# Patient Record
Sex: Male | Born: 1935
Health system: Southern US, Community
[De-identification: ages and names within clinical notes are randomized; demographics above are authoritative.]

## PROBLEM LIST (undated history)

## (undated) DIAGNOSIS — M109 Gout, unspecified: Secondary | ICD-10-CM

## (undated) DIAGNOSIS — I639 Cerebral infarction, unspecified: Secondary | ICD-10-CM

## (undated) DIAGNOSIS — E559 Vitamin D deficiency, unspecified: Secondary | ICD-10-CM

## (undated) DIAGNOSIS — N400 Enlarged prostate without lower urinary tract symptoms: Secondary | ICD-10-CM

## (undated) DIAGNOSIS — E785 Hyperlipidemia, unspecified: Secondary | ICD-10-CM

## (undated) DIAGNOSIS — I739 Peripheral vascular disease, unspecified: Secondary | ICD-10-CM

## (undated) DIAGNOSIS — M79605 Pain in left leg: Secondary | ICD-10-CM

## (undated) DIAGNOSIS — M1990 Unspecified osteoarthritis, unspecified site: Secondary | ICD-10-CM

## (undated) DIAGNOSIS — I872 Venous insufficiency (chronic) (peripheral): Secondary | ICD-10-CM

## (undated) DIAGNOSIS — D649 Anemia, unspecified: Secondary | ICD-10-CM

## (undated) DIAGNOSIS — I82409 Acute embolism and thrombosis of unspecified deep veins of unspecified lower extremity: Secondary | ICD-10-CM

## (undated) DIAGNOSIS — H269 Unspecified cataract: Secondary | ICD-10-CM

## (undated) DIAGNOSIS — I10 Essential (primary) hypertension: Secondary | ICD-10-CM

## (undated) HISTORY — DX: Venous insufficiency (chronic) (peripheral): I87.2

## (undated) HISTORY — DX: Acute embolism and thrombosis of unspecified deep veins of unspecified lower extremity: I82.409

## (undated) HISTORY — DX: Unspecified osteoarthritis, unspecified site: M19.90

## (undated) HISTORY — DX: Pain in left leg: M79.605

## (undated) HISTORY — DX: Cerebral infarction, unspecified: I63.9

## (undated) HISTORY — DX: Unspecified cataract: H26.9

## (undated) HISTORY — DX: Anemia, unspecified: D64.9

## (undated) HISTORY — DX: Vitamin D deficiency, unspecified: E55.9

## (undated) HISTORY — PX: CATARACT EXTRACTION, BILATERAL: SHX1313

---

## 2013-11-01 ENCOUNTER — Other Ambulatory Visit (HOSPITAL_COMMUNITY): Payer: Self-pay | Admitting: *Deleted

## 2013-11-01 ENCOUNTER — Ambulatory Visit (HOSPITAL_COMMUNITY)
Admission: RE | Admit: 2013-11-01 | Discharge: 2013-11-01 | Disposition: A | Discharge status: Home / Self Care | Payer: Medicare HMO | Source: Ambulatory Visit | Attending: *Deleted | Admitting: *Deleted

## 2013-11-01 DIAGNOSIS — M79609 Pain in unspecified limb: Secondary | ICD-10-CM

## 2013-11-01 DIAGNOSIS — L539 Erythematous condition, unspecified: Secondary | ICD-10-CM | POA: Insufficient documentation

## 2013-11-01 DIAGNOSIS — R599 Enlarged lymph nodes, unspecified: Secondary | ICD-10-CM | POA: Insufficient documentation

## 2013-11-01 DIAGNOSIS — M7989 Other specified soft tissue disorders: Secondary | ICD-10-CM

## 2013-11-01 NOTE — Progress Notes (Signed)
Left Lower Ext. Venous Duplex Completed. Negative for DVT. Tifani Dack, BS, RDMS, RVT  

## 2013-11-04 ENCOUNTER — Emergency Department (HOSPITAL_COMMUNITY)
Admission: EM | Admit: 2013-11-04 | Discharge: 2013-11-04 | Discharge status: Against Medical Advice | Payer: Medicare PPO

## 2013-11-04 ENCOUNTER — Inpatient Hospital Stay (HOSPITAL_COMMUNITY)
Admission: AD | Admit: 2013-11-04 | Discharge: 2013-11-09 | DRG: 603 | Disposition: A | Discharge status: Home / Self Care | Payer: Medicare HMO | Source: Ambulatory Visit | Attending: Internal Medicine | Admitting: Internal Medicine

## 2013-11-04 ENCOUNTER — Encounter (HOSPITAL_COMMUNITY): Payer: Self-pay | Admitting: Internal Medicine

## 2013-11-04 DIAGNOSIS — L03116 Cellulitis of left lower limb: Secondary | ICD-10-CM | POA: Diagnosis present

## 2013-11-04 DIAGNOSIS — I10 Essential (primary) hypertension: Secondary | ICD-10-CM | POA: Diagnosis present

## 2013-11-04 DIAGNOSIS — R630 Anorexia: Secondary | ICD-10-CM | POA: Diagnosis present

## 2013-11-04 DIAGNOSIS — N400 Enlarged prostate without lower urinary tract symptoms: Secondary | ICD-10-CM | POA: Diagnosis present

## 2013-11-04 DIAGNOSIS — L02419 Cutaneous abscess of limb, unspecified: Principal | ICD-10-CM | POA: Diagnosis present

## 2013-11-04 DIAGNOSIS — E785 Hyperlipidemia, unspecified: Secondary | ICD-10-CM | POA: Diagnosis present

## 2013-11-04 HISTORY — DX: Hyperlipidemia, unspecified: E78.5

## 2013-11-04 HISTORY — DX: Essential (primary) hypertension: I10

## 2013-11-04 HISTORY — DX: Benign prostatic hyperplasia without lower urinary tract symptoms: N40.0

## 2013-11-04 HISTORY — DX: Gout, unspecified: M10.9

## 2013-11-04 LAB — CBC
HCT: 33.7 % — ABNORMAL LOW (ref 39.0–52.0)
Hemoglobin: 11.3 g/dL — ABNORMAL LOW (ref 13.0–17.0)
MCHC: 33.5 g/dL (ref 30.0–36.0)
MCV: 90.3 fL (ref 78.0–100.0)
RDW: 13.9 % (ref 11.5–15.5)
WBC: 10.6 10*3/uL — ABNORMAL HIGH (ref 4.0–10.5)

## 2013-11-04 LAB — BASIC METABOLIC PANEL
BUN: 12 mg/dL (ref 6–23)
Calcium: 9.2 mg/dL (ref 8.4–10.5)
Creatinine, Ser: 0.95 mg/dL (ref 0.50–1.35)
GFR calc Af Amer: >90 mL/min (ref >90)
GFR calc non Af Amer: 78 mL/min — ABNORMAL LOW (ref >90)
Glucose, Bld: 113 mg/dL — ABNORMAL HIGH (ref 70–99)
Potassium: 4 mEq/L (ref 3.5–5.1)

## 2013-11-04 MED ORDER — VANCOMYCIN HCL 10 G IV SOLR
2000.0000 mg | Freq: Once | INTRAVENOUS | Status: AC
  Administered 2013-11-04: 2000 mg via INTRAVENOUS
  Filled 2013-11-04: qty 2000

## 2013-11-04 MED ORDER — HYDROCODONE-ACETAMINOPHEN 5-325 MG PO TABS
1.0000 | ORAL_TABLET | ORAL | Status: DC | PRN
  Administered 2013-11-04 – 2013-11-06 (×8): 2 via ORAL
  Filled 2013-11-04 (×8): qty 2

## 2013-11-04 MED ORDER — VANCOMYCIN HCL IN DEXTROSE 1-5 GM/200ML-% IV SOLN
1000.0000 mg | Freq: Two times a day (BID) | INTRAVENOUS | Status: DC
  Administered 2013-11-05 – 2013-11-07 (×4): 1000 mg via INTRAVENOUS
  Filled 2013-11-04 (×5): qty 200

## 2013-11-04 MED ORDER — HEPARIN SODIUM (PORCINE) 5000 UNIT/ML IJ SOLN
5000.0000 [IU] | Freq: Three times a day (TID) | INTRAMUSCULAR | Status: DC
  Administered 2013-11-04 – 2013-11-09 (×14): 5000 [IU] via SUBCUTANEOUS
  Filled 2013-11-04 (×17): qty 1

## 2013-11-04 NOTE — H&P (Signed)
[facility] History and Physical  Osiris Odriscoll JYN:[id number] DOB: 02/15/1933 DOA: 11/04/2013  Referring physician: EDP PCP: Egbert Garibaldi, NP   Chief Complaint: ED   HPI: Jesus Lam is a 79 y.o. male who is admitted from urgent care as a direct admit for LLE cellulitis, failed outpatient treatment.  Patient first noticed symptoms 12/8 days ago and it has progressively worsened.  Swelling and erythema in his LLE calf, [place] was negative for DVT.  WBC on 12/8 was 10.6.  Symptoms associated with anorexia.  Review of Systems: Systems reviewed.  As above, otherwise negative  Past Medical History  Diagnosis Date  . HTN (hypertension)   . BPH (benign prostatic hyperplasia)   . Dyslipidemia    No past surgical history on file. Social History:  has no tobacco, alcohol, and drug history on file.  No Known Allergies  No family history on file.   Prior to Admission medications   Not on File   Physical Exam: Filed Vitals:   11/04/13 1939  BP: 149/75  Pulse: 84  Temp: 98.1 F (36.7 C)  Resp: 18    BP 149/75  Pulse 84  Temp(Src) 98.1 F (36.7 C) (Oral)  Resp 18  Ht 6' (1.829 m)  Wt 115.214 kg (254 lb)  BMI 34.44 kg/m2  SpO2 97%  General Appearance:    Alert, oriented, no distress, appears stated age  Head:    Normocephalic, atraumatic  Eyes:    PERRL, EOMI, sclera non-icteric        Nose:   Nares without drainage or epistaxis. Mucosa, turbinates normal  Throat:   Moist mucous membranes. Oropharynx without erythema or exudate.  Neck:   Supple. No carotid bruits.  No thyromegaly.  No lymphadenopathy.   Back:     No CVA tenderness, no spinal tenderness  Lungs:     Clear to auscultation bilaterally, without wheezes, rhonchi or rales  Chest wall:    No tenderness to palpitation  Heart:    Regular rate and rhythm without murmurs, gallops, rubs  Abdomen:     Soft, non-tender, nondistended, normal bowel sounds, no organomegaly  Genitalia:    deferred   Rectal:    deferred  Extremities:   No clubbing, cyanosis or edema.  Pulses:   2+ and symmetric all extremities  Skin:   Erythema and edema of the LLE calf, TTP  Lymph nodes:   Cervical, supraclavicular, and axillary nodes normal  Neurologic:   CNII-XII intact. Normal strength, sensation and reflexes      throughout    Labs on Admission:  Basic Metabolic Panel: No results found for this basename: NA, K, CL, CO2, GLUCOSE, BUN, CREATININE, CALCIUM, MG, PHOS,  in the last 168 hours Liver Function Tests: No results found for this basename: AST, ALT, ALKPHOS, BILITOT, PROT, ALBUMIN,  in the last 168 hours No results found for this basename: LIPASE, AMYLASE,  in the last 168 hours No results found for this basename: AMMONIA,  in the last 168 hours CBC: No results found for this basename: WBC, NEUTROABS, HGB, HCT, MCV, PLT,  in the last 168 hours Cardiac Enzymes: No results found for this basename: CKTOTAL, CKMB, CKMBINDEX, TROPONINI,  in the last 168 hours  BNP (last 3 results) No results found for this basename: PROBNP,  in the last 8760 hours CBG: No results found for this basename: GLUCAP,  in the last 168 hours  Radiological Exams on Admission: No results found.  EKG: Independently reviewed.  Assessment/Plan Principal Problem:  Cellulitis of leg, left Active Problems:   HTN (hypertension)   1. Cellulitis of LLE calf - failed outpatient therapy, starting treatment with IV vancomycin, pharm to dose.  Pain control with norco.  Labs pending. 2. HTN - continue home BP meds once med rec is completed.    Code Status: Full  Family Communication: Daughter at bedside Disposition Plan: Admit to inpatient   Time spent: 50 min  GARDNER, JARED M. [facility] Pager [phone]  If 7AM-7PM, please contact the day team taking care of the patient [email or web address] Password [facility] 11/04/2013, 8:58 PM

## 2013-11-04 NOTE — Progress Notes (Signed)
Pt arrived on floor. Alert and oriented. Call light place within reach. Oriented to room and staff. Will monitor.

## 2013-11-04 NOTE — Progress Notes (Addendum)
Received call from [facility], [facility] from urgent care center.  Pt with a recent hx of LLL swelling and pain and follow up LE dopplers neg for DVT. Was empirically put on augmentin, started on 11/02/13. Was also given rocephin in the office on 11/01/13 and 11/02/13. WBC unremarkable at 10.6 as of 11/01/13. No hx of trauma. Temp 98.1 in the office.   Despite the above abx, the patient's LLE erythema, swelling and pain continued to worsen. As such, Hospitalists was called for possible direct admit for IV abx secondary to failure of treatment for cellulitis as an outpatient. Pt accepted for direct admit.

## 2013-11-04 NOTE — Progress Notes (Addendum)
ANTIBIOTIC CONSULT NOTE - INITIAL  Pharmacy Consult for vancomycin Indication: cellutlits of left lower leg  No Known Allergies  Patient Measurements: Height: 6' (182.9 cm) Weight: 254 lb (115.214 kg) IBW/kg (Calculated) : 77.6 Adjusted Body Weight:   Vital Signs: Temp: 98.1 F (36.7 C) (12/11 1939) Temp src: Oral (12/11 1939) BP: 149/75 mmHg (12/11 1939) Pulse Rate: 84 (12/11 1939) Intake/Output from previous day:   Intake/Output from this shift:    Labs: No results found for this basename: WBC, HGB, PLT, LABCREA, CREATININE,  in the last 72 hours CrCl is unknown because no creatinine reading has been taken. No results found for this basename: VANCOTROUGH, VANCOPEAK, VANCORANDOM, GENTTROUGH, GENTPEAK, GENTRANDOM, TOBRATROUGH, TOBRAPEAK, TOBRARND, AMIKACINPEAK, AMIKACINTROU, AMIKACIN,  in the last 72 hours   Microbiology: No results found for this or any previous visit (from the past 720 hour(s)).  Medical History: No past medical history on file.  Medications:  Scheduled:  Infusions:  Assessment: 79 yo male with cellulitis in left lower leg will be started on vancomycin therapy.  Patient was previously on augmentin and ceftriaxone with no improvement.  Wt 115.2 kg.  SCr 0.95 (CrCl 84)  Goal of Therapy:  Vancomycin trough level 10-15 mcg/ml  Plan:  1) Vancomycin 2g iv x1, then 1g iv q12h 2) Monitor renal function and culture and sensitivity if available 3) Follow up plan on antibiotic before checking vancomycin trough  Lani Mendiola, Tsz-Yin 11/04/2013,8:46 PM

## 2013-11-05 ENCOUNTER — Encounter (HOSPITAL_COMMUNITY): Payer: Self-pay | Admitting: General Practice

## 2013-11-05 LAB — CBC
HCT: 32.5 % — ABNORMAL LOW (ref 39.0–52.0)
Hemoglobin: 10.8 g/dL — ABNORMAL LOW (ref 13.0–17.0)
MCH: 30 pg (ref 26.0–34.0)
MCV: 90.3 fL (ref 78.0–100.0)
Platelets: 265 10*3/uL (ref 150–400)
RBC: 3.6 MIL/uL — ABNORMAL LOW (ref 4.22–5.81)
RDW: 13.9 % (ref 11.5–15.5)
WBC: 10.1 10*3/uL (ref 4.0–10.5)

## 2013-11-05 LAB — BASIC METABOLIC PANEL
BUN: 11 mg/dL (ref 6–23)
CO2: 23 mEq/L (ref 19–32)
Calcium: 8.9 mg/dL (ref 8.4–10.5)
Creatinine, Ser: 0.94 mg/dL (ref 0.50–1.35)
GFR calc Af Amer: >90 mL/min (ref >90)
Glucose, Bld: 104 mg/dL — ABNORMAL HIGH (ref 70–99)
Potassium: 3.8 mEq/L (ref 3.5–5.1)

## 2013-11-05 NOTE — Progress Notes (Signed)
Addendum  Patient seen and examined, chart and data base reviewed.  I agree with the above assessment and plan.  For full details please see Mrs. Algis Downs PA note.  On Vanc, continue.    Clint Lipps, MD [facility] Pager: [phone] 11/05/2013, 12:44 PM

## 2013-11-05 NOTE — Care Management Note (Addendum)
    Page 1 of 2   11/09/2013     4:06:16 PM   CARE MANAGEMENT NOTE 11/09/2013  Patient:  Jesus Lam, Jesus Lam   Account Number:  [id number]  Date Initiated:  11/05/2013  Documentation initiated by:  Letha Cape  Subjective/Objective Assessment:   dx cellulitis  admit- lives with daughter.     Action/Plan:   HHRN for iv abx   Anticipated DC Date:  11/09/2013   Anticipated DC Plan:  HOME W HOME HEALTH SERVICES      DC Planning Services  CM consult      [facility] Choice  HOME HEALTH   Choice offered to / List presented to:  C-4 Adult Children   DME arranged  BEDSIDE COMMODE  OTHER - SEE COMMENT      DME agency  [facility]     HH arranged  HH-1 RN  HH-2 PT  HH-3 OT      [facility] agency  [facility]   Status of service:  Completed, signed off Medicare Important Message given?   (If response is "NO", the following Medicare IM given date fields will be blank) Date Medicare IM given:   Date Additional Medicare IM given:    Discharge Disposition:  HOME W HOME HEALTH SERVICES  Per UR Regulation:  Reviewed for med. necessity/level of care/duration of stay  If discussed at Long Length of Stay Meetings, dates discussed:    Comments:  11/09/13 13:16 Letha Cape RN, BSN [phone] NCM spoke with patient and daughter, [name],  patient wants to go home with [facility] services and per OT rep patient is ok to go home with hh, Awaiting pt eval.  [name] states she would like for patient to have [facility], NCM left message for [facility] for [facility] for home iv abx, PT, and OT.  Patient is to receive a picc line today and dc today.  Received call from [facility] from [facility] stating they can take patient today, she will just need orders for Vanc in system, patient is receiving Vanc Q12 and last dose was at 10 am so he will prob need to get it at 8 pm tonight and then start at 8 am tomorrow.  [name] , patient's daughter states she will be abe to help him with the iv abx at  home.  Family requested [facility] , this information was given to [facility] at [facility].  Patient has picc line in and [facility] with [facility] has orders.  NCM faxed orders for rollator and bsc to [facility], family will pick up dme at [facility] today before 6 pm.  11/05/13 12:22 Letha Cape RN, BSN [phone] patient lives with daughter, NCM will continue to follow for dc needs.

## 2013-11-05 NOTE — Progress Notes (Signed)
[facility] PROGRESS NOTE  Jesus Lam WUJ:[id number] DOB: 12-08-1932 DOA: 11/04/2013 PCP: Egbert Garibaldi, NP   Jesus Lam is a 79 y.o. male who is admitted from urgent care as a direct admit for LLE cellulitis, failed outpatient treatment (Augmentin started 12/9). Patient first noticed symptoms 12/8 and it has progressively worsened. Swelling and erythema in his LLE calf, [place] on 12/8 was negative for DVT. WBC on 12/8 was 10.6.   Assessment/Plan:  LLE Cellulitis Admitted to med/surg floor  Started on vancomycin as of 12/11. Had two days of Augmentin outpatient. Patient with swelling and erythema.  Will trace erythema Elevate LLE.  Hypertension Stable.  Does not appear to be on any medications for hypertension as an outpatient.    DVT Prophylaxis:  heparin  Code Status: full Family Communication: Daughter at bedside Disposition Plan: lives with daughter - plan for d/c to home when appropriate (possible 12/13)   Consultants:   Procedures:   Antibiotics: Vancomycin started 12/11.  HPI/Subjective:  Patient complains of pain and swelling in left leg.  Also appetite has been off since 12/8.    Objective: Filed Vitals:   11/04/13 1939 11/05/13 0038 11/05/13 0444  BP: 149/75 125/70 143/64  Pulse: 84  67  Temp: 98.1 F (36.7 C)  98.9 F (37.2 C)  TempSrc: Oral  Oral  Resp: 18  18  Height: 6' (1.829 m)    Weight: 115.214 kg (254 lb)    SpO2: 97%  92%    Intake/Output Summary (Last 24 hours) at 11/05/13 1233 Last data filed at 11/05/13 0900  Gross per 24 hour  Intake    360 ml  Output    100 ml  Net    260 ml   Filed Weights   11/04/13 1939  Weight: 115.214 kg (254 lb)    Exam: General: Well developed, well nourished, NAD, appears stated age.  Daughter at bedside. HEENT:  PERR, EOMI, Anicteic Sclera, MMM. No pharyngeal erythema or exudates  Neck: Supple, no JVD, no masses  Cardiovascular: RRR, S1 S2 auscultated, no rubs, murmurs or  gallops.   Respiratory: Clear to auscultation bilaterally with equal chest rise  Abdomen: Soft, nontender, nondistended, + bowel sounds  Extremities: warm dry without cyanosis clubbing or edema.  Neuro: AAOx3, cranial nerves grossly intact. Strength 5/5 in upper and lower extremities  Skin: erythema starting below left knee and ending above the ankle.  Tight swelling over the anterior side of the leg to the dorsum of the foot.  Psych: Normal affect and demeanor with intact judgement and insight   Data Reviewed: Basic Metabolic Panel:  Recent Labs Lab 11/04/13 2135 11/05/13 0608  NA 135 137  K 4.0 3.8  CL 100 104  CO2 23 23  GLUCOSE 113* 104*  BUN 12 11  CREATININE 0.95 0.94  CALCIUM 9.2 8.9   CBC:  Recent Labs Lab 11/04/13 2135 11/05/13 0608  WBC 10.6* 10.1  HGB 11.3* 10.8*  HCT 33.7* 32.5*  MCV 90.3 90.3  PLT 254 265     Studies: No results found.  Scheduled Meds: . heparin  5,000 Units Subcutaneous Q8H  . vancomycin  1,000 mg Intravenous Q12H   Continuous Infusions:   Principal Problem:   Cellulitis of leg, left Active Problems:   HTN (hypertension)    Conley Canal  [facility] Pager [phone]. If 7PM-7AM, please contact night-coverage at [email or web address], password [facility] 11/05/2013, 12:33 PM  LOS: 1 day

## 2013-11-05 NOTE — Progress Notes (Signed)
Pt morning Bp 143/64, pt is c/o pain on left leg. Given PRN pain medication. Will re-check Bp after an hr. Will continue to monitor.

## 2013-11-05 NOTE — Progress Notes (Signed)
Patient's daughter is in room with patient for the night. Daughter asked what antibotics would be given to her father and RN responded that it would be vancomycin. Patient has received pain medication and his vanc. Will continue to monitor

## 2013-11-06 LAB — CBC
HCT: 33.3 % — ABNORMAL LOW (ref 39.0–52.0)
Hemoglobin: 11 g/dL — ABNORMAL LOW (ref 13.0–17.0)
MCH: 30.3 pg (ref 26.0–34.0)
MCHC: 33 g/dL (ref 30.0–36.0)
MCV: 91.7 fL (ref 78.0–100.0)
Platelets: 347 10*3/uL (ref 150–400)
RDW: 13.9 % (ref 11.5–15.5)

## 2013-11-06 MED ORDER — MORPHINE SULFATE 2 MG/ML IJ SOLN
1.0000 mg | INTRAMUSCULAR | Status: DC | PRN
  Administered 2013-11-06 – 2013-11-09 (×7): 2 mg via INTRAVENOUS
  Filled 2013-11-06 (×8): qty 1

## 2013-11-06 MED ORDER — OXYCODONE-ACETAMINOPHEN 5-325 MG PO TABS
1.0000 | ORAL_TABLET | Freq: Four times a day (QID) | ORAL | Status: DC | PRN
  Administered 2013-11-06 – 2013-11-09 (×10): 2 via ORAL
  Filled 2013-11-06 (×11): qty 2

## 2013-11-06 NOTE — Plan of Care (Signed)
Problem: Phase I Progression Outcomes Goal: OOB as tolerated unless otherwise ordered Outcome: Completed/Met Date Met:  11/06/13 Pt was up in chair at the beginning of this shift. He also gets up to use [facility]

## 2013-11-06 NOTE — Progress Notes (Signed)
Pt experiencing increasing pain, MD notified and new pain orders put in place. Will continue to monitor pt.

## 2013-11-06 NOTE — Progress Notes (Signed)
[facility] PROGRESS NOTE  Holly Pring VWU:[id number] DOB: 06-21-1933 DOA: 11/04/2013 PCP: Egbert Garibaldi, NP  HPI/Subjective:  Can not bear weight on it, looks about the same if not worse. Continue antibiotics.   Jesus Lam is a 79 y.o. male who is admitted from urgent care as a direct admit for LLE cellulitis, failed outpatient treatment (Augmentin started 12/9). Patient first noticed symptoms 12/8 and it has progressively worsened. Swelling and erythema in his LLE calf, [place] on 12/8 was negative for DVT. WBC on 12/8 was 10.6.   Assessment/Plan:  LLE Cellulitis Admitted to med/surg floor  Started on vancomycin as of 12/11. Had two days of Augmentin outpatient. Patient with swelling and erythema.  Will trace erythema Elevate LLE. Had negative Doppler ultrasound done as outpatient on 12/8.  Hypertension Stable.  Does not appear to be on any medications for hypertension as an outpatient.    DVT Prophylaxis:  heparin  Code Status: full Family Communication: Daughter at bedside Disposition Plan: lives with daughter - plan for d/c to home when appropriate (possible 12/13)   Consultants:   Procedures:   Antibiotics: Vancomycin started 12/11.      Objective: Filed Vitals:   11/05/13 0444 11/05/13 1353 11/05/13 2031 11/06/13 0525  BP: 143/64 142/78 152/73 148/72  Pulse: 67 67 69 61  Temp: 98.9 F (37.2 C) 97.7 F (36.5 C) 98.4 F (36.9 C) 97.8 F (36.6 C)  TempSrc: Oral Oral Oral Oral  Resp: 18 18 18 18   Height:      Weight:      SpO2: 92% 93% 92% 94%    Intake/Output Summary (Last 24 hours) at 11/06/13 1152 Last data filed at 11/06/13 0900  Gross per 24 hour  Intake    680 ml  Output    375 ml  Net    305 ml   Filed Weights   11/04/13 1939  Weight: 115.214 kg (254 lb)    Exam: General: Well developed, well nourished, NAD, appears stated age.  Daughter at bedside. HEENT:  PERR, EOMI, Anicteic Sclera, MMM. No pharyngeal erythema  or exudates  Neck: Supple, no JVD, no masses  Cardiovascular: RRR, S1 S2 auscultated, no rubs, murmurs or gallops.   Respiratory: Clear to auscultation bilaterally with equal chest rise  Abdomen: Soft, nontender, nondistended, + bowel sounds  Extremities: warm dry without cyanosis clubbing or edema.  Neuro: AAOx3, cranial nerves grossly intact. Strength 5/5 in upper and lower extremities  Skin: erythema starting below left knee and ending above the ankle.  Tight swelling over the anterior side of the leg to the dorsum of the foot.  Psych: Normal affect and demeanor with intact judgement and insight   Data Reviewed: Basic Metabolic Panel:  Recent Labs Lab 11/04/13 2135 11/05/13 0608  NA 135 137  K 4.0 3.8  CL 100 104  CO2 23 23  GLUCOSE 113* 104*  BUN 12 11  CREATININE 0.95 0.94  CALCIUM 9.2 8.9   CBC:  Recent Labs Lab 11/04/13 2135 11/05/13 0608 11/06/13 0415  WBC 10.6* 10.1 10.6*  HGB 11.3* 10.8* 11.0*  HCT 33.7* 32.5* 33.3*  MCV 90.3 90.3 91.7  PLT 254 265 347     Studies: No results found.  Scheduled Meds: . heparin  5,000 Units Subcutaneous Q8H  . vancomycin  1,000 mg Intravenous Q12H   Continuous Infusions:   Principal Problem:   Cellulitis of leg, left Active Problems:   HTN (hypertension)    Elmon Shader A  [facility] Pager [phone].  If 7PM-7AM, please contact night-coverage at [email or web address], password [facility] 11/06/2013, 11:52 AM  LOS: 2 days

## 2013-11-07 MED ORDER — INFLUENZA VAC SPLIT QUAD 0.5 ML IM SUSP
0.5000 mL | INTRAMUSCULAR | Status: DC
  Filled 2013-11-07: qty 0.5

## 2013-11-07 MED ORDER — SODIUM CHLORIDE 0.9 % IV SOLN
3.0000 g | Freq: Four times a day (QID) | INTRAVENOUS | Status: DC
  Administered 2013-11-07 – 2013-11-09 (×8): 3 g via INTRAVENOUS
  Filled 2013-11-07 (×14): qty 3

## 2013-11-07 MED ORDER — SODIUM CHLORIDE 0.9 % IV SOLN
1250.0000 mg | Freq: Two times a day (BID) | INTRAVENOUS | Status: DC
  Administered 2013-11-07 – 2013-11-09 (×5): 1250 mg via INTRAVENOUS
  Filled 2013-11-07 (×6): qty 1250

## 2013-11-07 NOTE — Progress Notes (Signed)
RN went to patient's room and noticed IV had come apart. Saline lock was still in patient's arm, but tubing was on the floor. IV was found to be infiltrated, and was removed. New IV was started in right anterior forearm.

## 2013-11-07 NOTE — Progress Notes (Signed)
ANTIBIOTIC CONSULT NOTE - INITIAL  Pharmacy Consult for Unasyn Indication: Cellulitis of left lower leg  Allergies  Allergen Reactions  . Shellfish Allergy     Gout     Patient Measurements: Height: 6' (182.9 cm) Weight: 254 lb (115.214 kg) IBW/kg (Calculated) : 77.6  Vital Signs: Temp: 98.6 F (37 C) (12/14 0442) Temp src: Oral (12/14 0442) BP: 147/81 mmHg (12/14 0442) Pulse Rate: 58 (12/14 0442) Intake/Output from previous day: 12/13 0701 - 12/14 0700 In: 680 [P.O.:680] Out: -  Intake/Output from this shift: Total I/O In: 360 [P.O.:360] Out: 400 [Urine:400]  Labs:  Recent Labs  11/04/13 2135 11/05/13 0608 11/06/13 0415  WBC 10.6* 10.1 10.6*  HGB 11.3* 10.8* 11.0*  PLT 254 265 347  CREATININE 0.95 0.94  --    Estimated Creatinine Clearance: 84.8 ml/min (by C-G formula based on Cr of 0.94).  Recent Labs  11/06/13 2330  VANCOTROUGH 8.5*     Microbiology: No results found for this or any previous visit (from the past 720 hour(s)).  Medical History: Past Medical History  Diagnosis Date  . HTN (hypertension)   . BPH (benign prostatic hyperplasia)   . Dyslipidemia   . Gout     Medications:  Scheduled:  . heparin  5,000 Units Subcutaneous Q8H  . vancomycin  1,250 mg Intravenous Q12H   Assessment: 79 yo male with cellulitis in left lower leg started on vancomycin therapy day #4. Patient still has significant swelling and redness. Pharmacy consulted to start Unasyn. Wt 115.2 kg. SCr 0.95 (CrCl 84)  Goal of Therapy:  Eradication of infection  Plan:  - Start Unasyn 3 gm IV q6h  - Continue vanc 1250 mg IV q12h  - Monitor renal function, C&S if available, clinical status  Margie Billet, PharmD Clinical Pharmacist - Resident Pager: [phone] Pharmacy: [phone] 11/07/2013 12:12 PM

## 2013-11-07 NOTE — Progress Notes (Signed)
ANTIBIOTIC CONSULT NOTE   Pharmacy Consult for vancomycin Indication: cellulitis   Allergies  Allergen Reactions  . Shellfish Allergy     Gout     Patient Measurements: Height: 6' (182.9 cm) Weight: 254 lb (115.214 kg) IBW/kg (Calculated) : 77.6 Adjusted Body Weight:   Vital Signs: Temp: 98.5 F (36.9 C) (12/13 2154) Temp src: Oral (12/13 2154) BP: 148/74 mmHg (12/13 2154) Pulse Rate: 65 (12/13 2154) Intake/Output from previous day: 12/13 0701 - 12/14 0700 In: 680 [P.O.:680] Out: -  Intake/Output from this shift:    Labs:  Recent Labs  11/04/13 2135 11/05/13 0608 11/06/13 0415  WBC 10.6* 10.1 10.6*  HGB 11.3* 10.8* 11.0*  PLT 254 265 347  CREATININE 0.95 0.94  --    Estimated Creatinine Clearance: 84.8 ml/min (by C-G formula based on Cr of 0.94).  Recent Labs  11/06/13 2330  VANCOTROUGH 8.5*  Assessment: 79 yo male with LLE cellulitis for vancomycin  Goal of Therapy:  Vancomycin trough level 10-15 mcg/ml  Plan:  Change vancomycin 1250 mg IV q12h   Eddie Candle 11/07/2013,12:59 AM

## 2013-11-07 NOTE — Progress Notes (Signed)
[facility] PROGRESS NOTE  Jesus Lam ZOX:[id number] DOB: [birth year] DOA: 11/04/2013 PCP: Egbert Garibaldi, NP  HPI/Subjective:  Still has a lot of swelling and redness. On vancomycin trough is low   Jesus Lam is a 79 y.o. male who is admitted from urgent care as a direct admit for LLE cellulitis, failed outpatient treatment (Augmentin started 12/9). Patient first noticed symptoms 12/8 and it has progressively worsened. Swelling and erythema in his LLE calf, [place] on 12/8 was negative for DVT. WBC on 12/8 was 10.6.   Assessment/Plan:  LLE Cellulitis Admitted to med/surg floor  Started on vancomycin as of 12/11. Had two days of Augmentin outpatient. Patient with swelling and erythema.  Will trace erythema Elevate LLE. Had negative Doppler ultrasound done as outpatient on 12/8. Patient is on vancomycin since admission, trough is low, does increase by pharmacy. I will add Unasyn.  Hypertension Stable.  Does not appear to be on any medications for hypertension as an outpatient.    DVT Prophylaxis:  heparin  Code Status: full Family Communication: Daughter at bedside Disposition Plan: lives with daughter - plan for d/c to home when appropriate (possible 12/13)   Consultants:   Procedures:   Antibiotics: Vancomycin started 12/11.  Objective: Filed Vitals:   11/06/13 0525 11/06/13 1318 11/06/13 2154 11/07/13 0442  BP: 148/72 157/74 148/74 147/81  Pulse: 61 63 65 58  Temp: 97.8 F (36.6 C) 97.5 F (36.4 C) 98.5 F (36.9 C) 98.6 F (37 C)  TempSrc: Oral Axillary Oral Oral  Resp: 18 18 18 18   Height:      Weight:      SpO2: 94% 95% 94% 94%    Intake/Output Summary (Last 24 hours) at 11/07/13 1132 Last data filed at 11/07/13 1027  Gross per 24 hour  Intake    840 ml  Output    400 ml  Net    440 ml   Filed Weights   11/04/13 1939  Weight: 115.214 kg (254 lb)    Exam: General: Well developed, well nourished, NAD, appears stated age.   Daughter at bedside. HEENT:  PERR, EOMI, Anicteic Sclera, MMM. No pharyngeal erythema or exudates  Neck: Supple, no JVD, no masses  Cardiovascular: RRR, S1 S2 auscultated, no rubs, murmurs or gallops.   Respiratory: Clear to auscultation bilaterally with equal chest rise  Abdomen: Soft, nontender, nondistended, + bowel sounds  Extremities: warm dry without cyanosis clubbing or edema.  Neuro: AAOx3, cranial nerves grossly intact. Strength 5/5 in upper and lower extremities  Skin: erythema starting below left knee and ending above the ankle.  Tight swelling over the anterior side of the leg to the dorsum of the foot.  Psych: Normal affect and demeanor with intact judgement and insight   Data Reviewed: Basic Metabolic Panel:  Recent Labs Lab 11/04/13 2135 11/05/13 0608  NA 135 137  K 4.0 3.8  CL 100 104  CO2 23 23  GLUCOSE 113* 104*  BUN 12 11  CREATININE 0.95 0.94  CALCIUM 9.2 8.9   CBC:  Recent Labs Lab 11/04/13 2135 11/05/13 0608 11/06/13 0415  WBC 10.6* 10.1 10.6*  HGB 11.3* 10.8* 11.0*  HCT 33.7* 32.5* 33.3*  MCV 90.3 90.3 91.7  PLT 254 265 347     Studies: No results found.  Scheduled Meds: . heparin  5,000 Units Subcutaneous Q8H  . vancomycin  1,250 mg Intravenous Q12H   Continuous Infusions:   Principal Problem:   Cellulitis of leg, left Active Problems:   HTN (  hypertension)    [facility] A  [facility] Pager [phone]. If 7PM-7AM, please contact night-coverage at [email or web address], password [facility] 11/07/2013, 11:32 AM  LOS: 3 days

## 2013-11-08 MED ORDER — NIFEDIPINE ER 30 MG PO TB24
30.0000 mg | ORAL_TABLET | Freq: Every evening | ORAL | Status: DC
  Administered 2013-11-08: 30 mg via ORAL
  Filled 2013-11-08 (×2): qty 1

## 2013-11-08 MED ORDER — TAMSULOSIN HCL 0.4 MG PO CAPS
0.4000 mg | ORAL_CAPSULE | Freq: Every day | ORAL | Status: DC
Start: 2013-11-08 — End: 2013-11-09
  Administered 2013-11-08 – 2013-11-09 (×2): 0.4 mg via ORAL
  Filled 2013-11-08 (×2): qty 1

## 2013-11-08 MED ORDER — FUROSEMIDE 40 MG PO TABS
40.0000 mg | ORAL_TABLET | Freq: Every day | ORAL | Status: DC
Start: 2013-11-08 — End: 2013-11-09
  Administered 2013-11-08 – 2013-11-09 (×2): 40 mg via ORAL
  Filled 2013-11-08 (×2): qty 1

## 2013-11-08 MED ORDER — HYDRALAZINE HCL 20 MG/ML IJ SOLN
5.0000 mg | Freq: Four times a day (QID) | INTRAMUSCULAR | Status: DC | PRN
  Administered 2013-11-08: 18:00:00 5 mg via INTRAVENOUS
  Filled 2013-11-08: qty 1

## 2013-11-08 NOTE — Progress Notes (Addendum)
[facility] PROGRESS NOTE  Jesus Lam [id number]:[id number] DOB: November 15, 1933 DOA: 11/04/2013 PCP: Egbert Garibaldi, NP  HPI/Subjective:  Still has redness and swelling in his left leg. But it's improving overall. Vancomycin dose adjusted, Unasyn added. I will scan his leg to rule out abscess formation/deep tissue infection.   Jesus Lam is a 79 y.o. male who is admitted from urgent care as a direct admit for LLE cellulitis, failed outpatient treatment (Augmentin started 12/9). Patient first noticed symptoms 12/8 and it has progressively worsened. Swelling and erythema in his LLE calf, [place] on 12/8 was negative for DVT. WBC on 12/8 was 10.6.   Assessment/Plan:  LLE Cellulitis Admitted to med/surg floor  Started on vancomycin as of 12/11. Had two days of Augmentin outpatient. Patient with swelling and erythema.  Will trace erythema Elevate LLE. Had negative Doppler ultrasound done as outpatient on 12/8. Patient now on vancomycin and Unasyn. PT/OT to evaluate and treat her  Hypertension Stable.  Does not appear to be on any medications for hypertension as an outpatient.    DVT Prophylaxis:  heparin  Code Status: full Family Communication: Daughter at bedside Disposition Plan: lives with daughter - plan for d/c to home when appropriate (possible 12/16)   Consultants:   Procedures:   Antibiotics: Vancomycin started 12/11.  Objective: Filed Vitals:   11/07/13 0442 11/07/13 1504 11/07/13 2254 11/08/13 0616  BP: 147/81 141/71 133/69 167/82  Pulse: 58 65 64 57  Temp: 98.6 F (37 C) 98.8 F (37.1 C) 98.5 F (36.9 C) 98.2 F (36.8 C)  TempSrc: Oral Oral Oral Oral  Resp: 18 18 18 20   Height:      Weight:      SpO2: 94% 91% 94% 94%    Intake/Output Summary (Last 24 hours) at 11/08/13 1124 Last data filed at 11/08/13 0835  Gross per 24 hour  Intake    240 ml  Output   1350 ml  Net  -1110 ml   Filed Weights   11/04/13 1939  Weight: 115.214 kg (254 lb)     Exam: General: Well developed, well nourished, NAD, appears stated age.  Daughter at bedside. HEENT:  PERR, EOMI, Anicteic Sclera, MMM. No pharyngeal erythema or exudates  Neck: Supple, no JVD, no masses  Cardiovascular: RRR, S1 S2 auscultated, no rubs, murmurs or gallops.   Respiratory: Clear to auscultation bilaterally with equal chest rise  Abdomen: Soft, nontender, nondistended, + bowel sounds  Extremities: warm dry without cyanosis clubbing or edema.  Neuro: AAOx3, cranial nerves grossly intact. Strength 5/5 in upper and lower extremities  Skin: erythema starting below left knee and ending above the ankle.  Tight swelling over the anterior side of the leg to the dorsum of the foot.  Psych: Normal affect and demeanor with intact judgement and insight   Data Reviewed: Basic Metabolic Panel:  Recent Labs Lab 11/04/13 2135 11/05/13 0608  NA 135 137  K 4.0 3.8  CL 100 104  CO2 23 23  GLUCOSE 113* 104*  BUN 12 11  CREATININE 0.95 0.94  CALCIUM 9.2 8.9   CBC:  Recent Labs Lab 11/04/13 2135 11/05/13 0608 11/06/13 0415  WBC 10.6* 10.1 10.6*  HGB 11.3* 10.8* 11.0*  HCT 33.7* 32.5* 33.3*  MCV 90.3 90.3 91.7  PLT 254 265 347     Studies: No results found.  Scheduled Meds: . ampicillin-sulbactam (UNASYN) IV  3 g Intravenous Q6H  . heparin  5,000 Units Subcutaneous Q8H  . influenza vac split quadrivalent PF  0.5 mL Intramuscular Tomorrow-1000  . vancomycin  1,250 mg Intravenous Q12H   Continuous Infusions:   Principal Problem:   Cellulitis of leg, left Active Problems:   HTN (hypertension)    Jesus Lam A  [facility] Pager [phone]. If 7PM-7AM, please contact night-coverage at [email or web address], password [facility] 11/08/2013, 11:24 AM  LOS: 4 days

## 2013-11-09 ENCOUNTER — Inpatient Hospital Stay (HOSPITAL_COMMUNITY): Payer: Medicare HMO

## 2013-11-09 LAB — BASIC METABOLIC PANEL
BUN: 9 mg/dL (ref 6–23)
CO2: 25 mEq/L (ref 19–32)
Chloride: 101 mEq/L (ref 96–112)
GFR calc Af Amer: 89 mL/min — ABNORMAL LOW (ref >90)
Potassium: 4.1 mEq/L (ref 3.5–5.1)

## 2013-11-09 LAB — CBC
HCT: 32.6 % — ABNORMAL LOW (ref 39.0–52.0)
Hemoglobin: 10.6 g/dL — ABNORMAL LOW (ref 13.0–17.0)
MCH: 30 pg (ref 26.0–34.0)
MCHC: 32.5 g/dL (ref 30.0–36.0)
MCV: 92.4 fL (ref 78.0–100.0)
RDW: 14.1 % (ref 11.5–15.5)
WBC: 10.1 10*3/uL (ref 4.0–10.5)

## 2013-11-09 MED ORDER — SODIUM CHLORIDE 0.9 % IJ SOLN: 10.0000 mL | INTRAMUSCULAR | Status: DC | PRN

## 2013-11-09 MED ORDER — TRAMADOL-ACETAMINOPHEN 37.5-325 MG PO TABS: 2.0000 | ORAL_TABLET | Freq: Three times a day (TID) | ORAL | Status: DC | PRN

## 2013-11-09 MED ORDER — FUROSEMIDE 10 MG/ML IJ SOLN
40.0000 mg | Freq: Once | INTRAMUSCULAR | Status: AC
  Administered 2013-11-09: 40 mg via INTRAVENOUS
  Filled 2013-11-09: qty 4

## 2013-11-09 MED ORDER — VANCOMYCIN HCL 10 G IV SOLR: 1250.0000 mg | Freq: Two times a day (BID) | INTRAVENOUS | Status: AC

## 2013-11-09 MED ORDER — IOHEXOL 300 MG/ML  SOLN
100.0000 mL | Freq: Once | INTRAMUSCULAR | Status: AC | PRN
  Administered 2013-11-09: 01:00:00 100 mL via INTRAVENOUS

## 2013-11-09 MED ORDER — OXYCODONE-ACETAMINOPHEN 5-325 MG PO TABS: 1.0000 | ORAL_TABLET | ORAL | Status: DC | PRN

## 2013-11-09 NOTE — Evaluation (Signed)
Physical Therapy Evaluation Patient Details Name: Jesus Lam MRN: [id number] DOB: 1933/06/16 Today's Date: 11/09/2013 Time: 4098-1191 PT Time Calculation (min): 15 min  PT Assessment / Plan / Recommendation History of Present Illness  Jesus Lam is a 79 y.o. male who is admitted from urgent care as a direct admit for LLE cellulitis, failed outpatient treatment.  Patient first noticed symptoms 12/8 days ago and it has progressively worsened.  Swelling and erythema in his LLE calf, [place] was negative for DVT.    Clinical Impression  Pt is ambulating with min assist due to antalgic gait pattern (painful left lower leg).  He would benefit from a rollator (4 wheeled RW with seat) at discharge, because he is very limited in his gait distance and tolerance due to leg pain.  He does not have much warning when he needs to sit down and this will provide him with a ready-made seat to do so at home or in the community when needed.  He would also benefit from HHPT to assess his home safety.    PT Assessment  Patient needs continued PT services    Follow Up Recommendations  Home health PT;Supervision for mobility/OOB    Does the patient have the potential to tolerate intense rehabilitation     NA  Barriers to Discharge   None      Equipment Recommendations  Rolling walker with 5" wheels;Other (comment) (4 wheeled RW with seat)    Recommendations for Other Services   None  Frequency Min 3X/week    Precautions / Restrictions Precautions Precautions: Fall Precaution Comments: due to painful left leg and decreased ability to WB due to pain left leg Restrictions Weight Bearing Restrictions: No   Pertinent Vitals/Pain See vitals flow sheet.      Mobility  Bed Mobility Bed Mobility: Supine to Sit;Sitting - Scoot to Edge of Bed Supine to Sit: 5: Supervision;With rails;HOB elevated Sitting - Scoot to Edge of Bed: 5: Supervision;With rail Sit to Supine: 6: Modified independent  (Device/Increase time) Scooting to [facility]: 6: Modified independent (Device/Increase time) Details for Bed Mobility Assistance: supervision for safety Transfers Transfers: Sit to Stand;Stand to Sit Sit to Stand: 4: Min guard;With upper extremity assist;With armrests;From bed;From chair/3-in-1 Stand to Sit: 4: Min guard;With upper extremity assist;With armrests;To bed;To chair/3-in-1 Details for Transfer Assistance: min guard assist for safety and to steady pt during transitions.   Ambulation/Gait Ambulation/Gait Assistance: 4: Min guard Ambulation Distance (Feet): 55 Feet Assistive device: 4-wheeled walker Ambulation/Gait Assistance Details: assessed his ability to use a rollator due to requested by daughter who thought it would be beneficial for him to sit and rest on it because of his limited acitivity tolerance and endurance.   Gait Pattern: Step-to pattern;Antalgic;Trunk flexed Gait velocity: decreased due to pain Stairs: Yes Stairs Assistance: 4: Min assist Stairs Assistance Details (indicate cue type and reason): cues for step to pattern and safest lower extremity sequencing.  Pt used rail during this first practice session on the stairs.  Would benefit from trying cane on stairs as only one daughter will be assisting him home.   Stair Management Technique: One rail Right;Step to pattern;Forwards;Other (comment) (with hand held assist on the left.  ) Number of Stairs: 3        PT Diagnosis: Difficulty walking;Abnormality of gait;Generalized weakness;Acute pain  PT Problem List: Decreased strength;Decreased activity tolerance;Decreased balance;Decreased mobility;Decreased knowledge of use of DME;Pain PT Treatment Interventions: DME instruction;Gait training;Functional mobility training;Stair training;Therapeutic activities;Therapeutic exercise;Balance training;Patient/family education;Neuromuscular re-education;Modalities  PT Goals(Current goals can be found in the care plan  section) Acute Rehab PT Goals Patient Stated Goal: to go home PT Goal Formulation: With patient Time For Goal Achievement: 11/23/13 Potential to Achieve Goals: Good  Visit Information  Last PT Received On: 11/09/13 Assistance Needed: +1 History of Present Illness: Jesus Lam is a 79 y.o. male who is admitted from urgent care as a direct admit for LLE cellulitis, failed outpatient treatment.  Patient first noticed symptoms 12/8 days ago and it has progressively worsened.  Swelling and erythema in his LLE calf, [place] was negative for DVT.         Prior Functioning  Home Living Family/patient expects to be discharged to:: Private residence Living Arrangements: Children Available Help at Discharge: Family Type of Home: House Home Access: Stairs to enter Secretary/administrator of Steps: 4 Entrance Stairs-Rails: None Home Layout: One level Home Equipment: Cane - single point;Wheelchair - power Prior Function Level of Independence: Independent Comments: does not drive, pt's daughters in law do cooking, cleaning, laundry Communication Communication: No difficulties Dominant Hand: Right    Cognition  Cognition Arousal/Alertness: Awake/alert Behavior During Therapy: WFL for tasks assessed/performed Overall Cognitive Status: Within Functional Limits for tasks assessed    Extremity/Trunk Assessment Upper Extremity Assessment Upper Extremity Assessment: Defer to OT evaluation Lower Extremity Assessment Lower Extremity Assessment: LLE deficits/detail LLE Deficits / Details: left leg red and edemous, pt with difficulty weight bearing during gait due to pain.   Cervical / Trunk Assessment Cervical / Trunk Assessment: Normal   Balance Balance Balance Assessed: Yes Dynamic Sitting Balance Dynamic Sitting - Balance Support: No upper extremity supported;During functional activity;Feet unsupported Dynamic Sitting - Level of Assistance: 6: Modified independent (Device/Increase  time) Static Standing Balance Static Standing - Balance Support: Left upper extremity supported;No upper extremity supported;During functional activity Static Standing - Level of Assistance: 5: Stand by assistance Dynamic Standing Balance Dynamic Standing - Balance Support: No upper extremity supported;Left upper extremity supported;During functional activity Dynamic Standing - Level of Assistance: Other (comment) (min guard A)  End of Session PT - End of Session Equipment Utilized During Treatment: Gait belt Activity Tolerance: Patient limited by pain Patient left: in chair;with call bell/phone within reach;with family/visitor present    Lurena Joiner B. Jahnessa Vanduyn, PT, DPT [phone]   11/09/2013, 4:29 PM

## 2013-11-09 NOTE — Discharge Summary (Signed)
Addendum  Patient seen and examined, chart and data base reviewed.  I agree with the above assessment and plan.  For full details please see Mrs. Algis Downs PA note.  Left lower extremity cellulitis, significant, failed outpatient therapy to be started on vancomycin for 9 more days   Clint Lipps, MD [facility] Pager: [phone] 11/09/2013, 4:28 PM

## 2013-11-09 NOTE — Clinical Social Work Note (Signed)
CSW met with patient at bedside for SNF placement. Patient states the he will be going home with daughter and will NOT be going to a SNF for rehab. RNCM has been notified. CSW signing off.  Roddie Mc, [place], [place], [phone]

## 2013-11-09 NOTE — Progress Notes (Signed)
Peripherally Inserted Central Catheter/Midline Placement  The IV Nurse has discussed with the patient and/or persons authorized to consent for the patient, the purpose of this procedure and the potential benefits and risks involved with this procedure.  The benefits include less needle sticks, lab draws from the catheter and patient may be discharged home with the catheter.  Risks include, but not limited to, infection, bleeding, blood clot (thrombus formation), and puncture of an artery; nerve damage and irregular heat beat.  Alternatives to this procedure were also discussed.  PICC/Midline Placement Documentation        Lisabeth Devoid 11/09/2013, 3:25 PM

## 2013-11-09 NOTE — Discharge Summary (Signed)
Physician Discharge Summary  Jesus Lam ZOX:[id number] DOB: 03-29-1933 DOA: 11/04/2013  PCP: Egbert Garibaldi, NP  Admit date: 11/04/2013 Discharge date: 11/09/2013  Time spent: 45 minutes  Recommendations for Outpatient Follow-up:  1. Followup with her primary care physician in one week 2. 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 Patient first noticed symptoms 12/8 days ago and it has progressively worsened. Swelling and erythema in his LLE calf, [place] was negative for DVT. WBC on 12/8 was 10.6. Symptoms associated with anorexia.   Hospital Course:  LLE Cellulitis  Admitted to med/surg floor  Duplex ultrasound done outpatient on December 8 was negative for DVT bilaterally Started on vancomycin as of 12/11.  Initially the swelling, pain, and erythema became worse on antibiotics After approximately 48 hours the patient began to improve On the day of discharge his swelling is resolved, but erythema remains. Patient has significant pain when standing. Will discharge to home on IV vancomycin with PICC line and narcotic pain medications Home health RN, physical therapy, occupational therapy. We will advise the patient not to take his home medications of Mobic or Ultracet while on the vancomycin and oxycodone.    Hypertension  Well-controlled Initially the patient's Lasix and nifedipine was held. Lasix was restarted on December 15. On December 16 the patient was mildly fluid  overloaded and required one dose of IV Lasix He will be discharged on his previous home doses of Lasix and nifedipine as well as Flomax.    Procedures:  PICC placement  Consultations:  None  Discharge Exam: Filed Vitals:   11/09/13 0512  BP: 152/70  Pulse: 66  Temp: 99 F (37.2 C)  Resp: 18   General: Well developed, well nourished, NAD, appears stated age. OT in the room  HEENT: PERR, EOMI, Anicteic Sclera, MMM. No pharyngeal erythema or exudates  Neck: Supple, no JVD, no masses  Cardiovascular: RRR, S1 S2 auscultated, no rubs, murmurs or gallops.  Respiratory: Clear to auscultation bilaterally with equal chest rise  Abdomen: Soft, nontender, nondistended, + bowel sounds  Extremities: warm dry without cyanosis clubbing or edema.  Neuro: AAOx3, cranial nerves grossly intact. Strength 5/5 in upper and lower extremities  Skin:  Swelling of left lower extremity is completely resolved. Erythema begins below the knee and travels to the ankle. There is no drainage.     Discharge Instructions      Discharge Orders   Future Orders Complete By Expires   Diet - low sodium heart healthy  As directed    Increase activity slowly  As directed        Medication List    STOP taking these medications       amoxicillin-clavulanate 875-125 MG per tablet  Commonly known as:  AUGMENTIN     meloxicam 15 MG tablet  Commonly known as:  MOBIC      TAKE these medications       furosemide 40 MG tablet  Commonly known as:  LASIX  Take 40 mg by  mouth.     NIFEdipine 30 MG 24 hr tablet  Commonly known as:  PROCARDIA-XL/ADALAT-CC/NIFEDICAL-XL  Take 30 mg by mouth every evening.     oxyCODONE-acetaminophen 5-325 MG per tablet  Commonly known as:  PERCOCET/ROXICET  Take 1-2 tablets by mouth every 4 (four) hours as needed for severe pain.     sodium chloride 0.9 % SOLN 250 mL with vancomycin 10 G SOLR 1,250 mg  Inject 1,250 mg into the vein every 12 (twelve) hours.      tamsulosin 0.4 MG Caps capsule  Commonly known as:  FLOMAX  Take 0.4 mg by mouth.     traMADol-acetaminophen 37.5-325 MG per tablet  Commonly known as:  ULTRACET  Take 2 tablets by mouth 3 (three) times daily as needed for moderate pain. Take this when you are not taking the Oxycodone (Percocet).  Do not take them together.       Allergies  Allergen Reactions  . Shellfish Allergy     Gout    Follow-up Information   Follow up with Millsaps, Joelene Millin, NP. Schedule an appointment as soon as possible for a visit in 1 week.   Contact information:   [facility] [address] [place] [address] [phone]        The results of significant diagnostics from this hospitalization (including imaging, microbiology, ancillary and laboratory) are listed below for reference.    Significant Diagnostic Studies: Ct Tibia Fibula Left W Contrast  11/09/2013   CLINICAL DATA:  Cellulitis.  EXAM: CT OF THE LEFT TIBIA AND FIBULA WITH CONTRAST  TECHNIQUE: Axial images were performed during intravenous administration of contrast. Coronal and sagittal images were then reconstructed.  CONTRAST:  OMNIPAQUE IOHEXOL 300 MG/ML  SOLN  COMPARISON:  None.  FINDINGS: There is circumferential edema in the subcutaneous fat of the left lower leg but there is no extension of the of fluid into the deep fascia planes. The muscles and bones appear normal. There is atherosclerotic irregularity of the arteries. There are no ankle or knee joint effusions. There is moderate arthritis in the knee.  There are no abscesses in the subcutaneous soft tissues.  IMPRESSION: Edema in the subcutaneous fat of the lower leg consistent with cellulitis. No abscess or myositis or osteomyelitis. No deep extension.   Electronically Signed   By: Geanie Cooley M.D.   On: 11/09/2013 08:17      Labs: Basic Metabolic Panel:  Recent Labs Lab 11/04/13 2135 11/05/13 0608 11/09/13 0537  NA 135 137 137  K 4.0 3.8  4.1  CL 100 104 101  CO2 23 23 25   GLUCOSE 113* 104* 110*  BUN 12 11 9   CREATININE 0.95 0.94 0.99  CALCIUM 9.2 8.9 9.4   CBC:  Recent Labs Lab 11/04/13 2135 11/05/13 0608 11/06/13 0415 11/09/13 0537  WBC 10.6* 10.1 10.6* 10.1  HGB 11.3* 10.8* 11.0* 10.6*  HCT 33.7* 32.5* 33.3* 32.6*  MCV 90.3 90.3 91.7 92.4  PLT 254 265 347 381     SignedConley Canal [phone]  [facility] 11/09/2013, 2:07 PM

## 2013-11-09 NOTE — Evaluation (Signed)
Occupational Therapy Evaluation Patient Details Name: Jacquees Gongora MRN: [id number] DOB: 01/25/1933 Today's Date: 11/09/2013 Time: 7829-5621 OT Time Calculation (min): 31 min  OT Assessment / Plan / Recommendation History of present illness Razi Hickle is a 79 y.o. male who is admitted from urgent care as a direct admit for LLE cellulitis, failed outpatient treatment.  Patient first noticed symptoms 12/8 days ago and it has progressively worsened.  Swelling and erythema in his LLE calf, [place] was negative for DVT.  WBC on 12/8 was 10.6.  Symptoms associated with anorexia.   Clinical Impression   Pt demos decline in function and safety with ADLs and ADL mobility and would benefit from acute OT services to address impairments to help restore PLOF to return home safely. Pt is limited by L LE pain         OT Assessment  Patient needs continued OT Services    Follow Up Recommendations  No OT follow up;Supervision/Assistance - 24 hour;Supervision - Intermittent( may need HH PT pending acute PT eval)   Barriers to Discharge   none  Equipment Recommendations  3 in 1 bedside comode    Recommendations for Other Services PT consult  Frequency  Min 2X/week    Precautions / Restrictions Precautions Precautions: Fall Restrictions Weight Bearing Restrictions: No   Pertinent Vitals/Pain 10/10 L LE, pain meds given by nurse    ADL  Grooming: Performed;Wash/dry hands;Wash/dry face;Min guard Where Assessed - Grooming: Supported standing;Unsupported standing Upper Body Bathing: Simulated;Supervision/safety;Set up Where Assessed - Upper Body Bathing: Unsupported sitting Lower Body Bathing: Minimal assistance;Simulated Upper Body Dressing: Performed;Supervision/safety;Set up Where Assessed - Upper Body Dressing: Unsupported sitting Lower Body Dressing: Performed;Minimal assistance Toilet Transfer: Performed;Min guard;Minimal assistance Toilet Transfer Method: Sit to stand Toilet Transfer  Equipment: Regular height toilet;Grab bars Toileting - Clothing Manipulation and Hygiene: Performed;Min guard Where Assessed - Engineer, mining and Hygiene: Standing Tub/Shower Transfer Method: Not assessed Equipment Used: Gait belt;Rolling walker Transfers/Ambulation Related to ADLs: cues for assist to wait for therapist/nurse tech, correct hand placement ADL Comments: educated pt on use of tub bench, provided picture    OT Diagnosis: Acute pain  OT Problem List: Decreased knowledge of use of DME or AE;Pain;Impaired balance (sitting and/or standing) OT Treatment Interventions: Self-care/ADL training;Therapeutic exercise;Patient/family education;Neuromuscular education;Balance training;Therapeutic activities;DME and/or AE instruction   OT Goals(Current goals can be found in the care plan section) Acute Rehab OT Goals Patient Stated Goal: to go to rehab or home OT Goal Formulation: With patient Time For Goal Achievement: 11/16/13 Potential to Achieve Goals: Good ADL Goals Pt Will Perform Grooming: with set-up;with supervision;standing Pt Will Perform Lower Body Bathing: with min guard assist;sit to/from stand;with adaptive equipment Pt Will Perform Lower Body Dressing: with min guard assist;with adaptive equipment;sit to/from stand Pt Will Transfer to Toilet: with min guard assist;with supervision;ambulating;regular height toilet;grab bars Pt Will Perform Toileting - Clothing Manipulation and hygiene: with supervision;with modified independence;sit to/from stand  Visit Information  Last OT Received On: 11/09/13 Assistance Needed: +1 History of Present Illness: Bluford Sedler is a 79 y.o. male who is admitted from urgent care as a direct admit for LLE cellulitis, failed outpatient treatment.  Patient first noticed symptoms 12/8 days ago and it has progressively worsened.  Swelling and erythema in his LLE calf, [place] was negative for DVT.  WBC on 12/8 was 10.6.  Symptoms  associated with anorexia.       Prior Functioning     Home Living Family/patient expects to be discharged to:: Private residence  Living Arrangements: Children Available Help at Discharge: Family Type of Home: House Home Access: Stairs to enter [employer] of Steps: 4 Home Layout: One level Home Equipment: Cane - single point;Wheelchair - power Prior Function Level of Independence: Independent Comments: does not drive, pt's daughters do cooking, cleaning, laundry Communication Communication: No difficulties Dominant Hand: Right         Vision/Perception Vision - History Baseline Vision: Wears glasses only for reading Patient Visual Report: No change from baseline Perception Perception: Within Functional Limits   Cognition  Cognition Arousal/Alertness: Awake/alert Behavior During Therapy: WFL for tasks assessed/performed Overall Cognitive Status: Within Functional Limits for tasks assessed    Extremity/Trunk Assessment Upper Extremity Assessment Upper Extremity Assessment: Overall WFL for tasks assessed Lower Extremity Assessment Lower Extremity Assessment: Defer to PT evaluation Cervical / Trunk Assessment Cervical / Trunk Assessment: Normal     Mobility Bed Mobility Bed Mobility: Sit to Supine;Scooting to HOB Sit to Supine: 6: Modified independent (Device/Increase time) Scooting to [facility]: 6: Modified independent (Device/Increase time) Transfers Transfers: Sit to Stand;Stand to Sit Sit to Stand: 4: Min guard;From toilet;From bed;4: Min assist Stand to Sit: To toilet;To bed;4: Min guard Details for Transfer Assistance: cues for assist to wait for therapist/nurse tech, correct hand placement     Exercise     Balance Balance Balance Assessed: Yes Dynamic Sitting Balance Dynamic Sitting - Balance Support: No upper extremity supported;During functional activity;Feet unsupported Dynamic Sitting - Level of Assistance: 6: Modified independent  (Device/Increase time) Static Standing Balance Static Standing - Balance Support: Left upper extremity supported;No upper extremity supported;During functional activity Static Standing - Level of Assistance: 5: Stand by assistance Dynamic Standing Balance Dynamic Standing - Balance Support: No upper extremity supported;Left upper extremity supported;During functional activity Dynamic Standing - Level of Assistance: Other (comment) (min guard A)   End of Session OT - End of Session Equipment Utilized During Treatment: Gait belt;Rolling walker Activity Tolerance: Patient limited by pain Patient left: in bed;with call bell/phone within reach  GO     Galen Manila 11/09/2013, 1:32 PM

## 2017-06-27 ENCOUNTER — Encounter (HOSPITAL_COMMUNITY): Payer: Self-pay | Admitting: Emergency Medicine

## 2017-06-27 ENCOUNTER — Ambulatory Visit (HOSPITAL_COMMUNITY)
Admission: EM | Admit: 2017-06-27 | Discharge: 2017-06-27 | Disposition: A | Discharge status: Home / Self Care | Payer: Medicare HMO

## 2017-06-27 DIAGNOSIS — H6123 Impacted cerumen, bilateral: Secondary | ICD-10-CM

## 2017-06-27 DIAGNOSIS — Z760 Encounter for issue of repeat prescription: Secondary | ICD-10-CM

## 2017-06-27 DIAGNOSIS — I10 Essential (primary) hypertension: Secondary | ICD-10-CM

## 2017-06-27 MED ORDER — LISINOPRIL-HYDROCHLOROTHIAZIDE 20-12.5 MG PO TABS: 1.0000 | ORAL_TABLET | Freq: Every day | ORAL | 1 refills | Status: DC

## 2017-06-27 MED ORDER — CARBAMIDE PEROXIDE 6.5 % OT SOLN: 5.0000 [drp] | Freq: Two times a day (BID) | OTIC | 0 refills | Status: DC

## 2017-06-27 MED ORDER — TAMSULOSIN HCL 0.4 MG PO CAPS: 0.4000 mg | ORAL_CAPSULE | Freq: Every day | ORAL | 1 refills | Status: DC

## 2017-06-27 NOTE — ED Provider Notes (Addendum)
CSN: [id number]     Arrival date & time 06/27/17  1757 History   None    Chief Complaint  Patient presents with  . Medication Refill  . Ear Fullness   (Consider location/radiation/quality/duration/timing/severity/associated sxs/prior Treatment) Pt stopped bp meds 1 month ago states that his pcp did not give him refills. Had bp checked and it was elevated so felt he needed to get back on his medication. Denies any chest pain, no sob, no numbness or weakness. No facial droop. Also c/o ear fullness denies any pain. Has not treated the ear with anything states that he has in the past had drops for ear wax.       Past Medical History:  Diagnosis Date  . BPH (benign prostatic hyperplasia)   . Dyslipidemia   . Gout   . HTN (hypertension)    Past Surgical History:  Procedure Laterality Date  . Gout    . NO PAST SURGERIES     History reviewed. No pertinent family history. Social History  Substance Use Topics  . Smoking status: Never Smoker  . Smokeless tobacco: Never Used  . Alcohol use Yes     Comment: occasionally    Review of Systems  Constitutional: Negative.   HENT: Positive for ear pain.   Eyes: Negative.   Respiratory: Negative.   Cardiovascular: Negative.   Gastrointestinal: Negative.   Musculoskeletal: Negative.   Skin: Negative.   Neurological: Negative.     Allergies  Shellfish allergy  Home Medications   Prior to Admission medications   Medication Sig Start Date End Date Taking? Authorizing Provider  allopurinol (ZYLOPRIM) 300 MG tablet Take 300 mg by mouth daily.    [provider]  carbamide peroxide (DEBROX) 6.5 % OTIC solution Place 5 drops into both ears 2 (two) times daily. 06/27/17   Tobi Bastos, NP  furosemide (LASIX) 40 MG tablet Take 40 mg by mouth.    [provider]  gabapentin (NEURONTIN) 300 MG capsule Take 300 mg by mouth at bedtime.    [provider]  lisinopril-hydrochlorothiazide (PRINZIDE,ZESTORETIC)  20-12.5 MG tablet Take 1 tablet by mouth daily. 06/27/17   Tobi Bastos, NP  NIFEdipine (PROCARDIA-XL/ADALAT-CC/NIFEDICAL-XL) 30 MG 24 hr tablet Take 30 mg by mouth every evening.    [provider]  oxyCODONE-acetaminophen (PERCOCET/ROXICET) 5-325 MG per tablet Take 1-2 tablets by mouth every 4 (four) hours as needed for severe pain. 11/09/13   Dellinger, Tora Kindred, PA-C  tamsulosin (FLOMAX) 0.4 MG CAPS capsule Take 1 capsule (0.4 mg total) by mouth daily after supper. 06/27/17   Tobi Bastos, NP  traMADol-acetaminophen (ULTRACET) 37.5-325 MG per tablet Take 2 tablets by mouth 3 (three) times daily as needed for moderate pain. Take this when you are not taking the Oxycodone (Percocet).  Do not take them together. 11/09/13   Dellinger, Tora Kindred, PA-C   Meds Ordered and Administered this Visit  Medications - No data to display  BP (!) 156/78 (BP Location: Left Arm)   Pulse 80   Temp 98.9 F (37.2 C) (Oral)   Resp 16   SpO2 96%  No data found.   Physical Exam  Constitutional: He appears well-developed.  HENT:  Ear wax moderate amount to bil ears ,   Eyes: Pupils are equal, round, and reactive to light.  Neck: Normal range of motion.  Cardiovascular: Normal rate and regular rhythm.   Pulmonary/Chest: Effort normal and breath sounds normal.  Abdominal: Soft. Bowel sounds are normal.  Musculoskeletal:  Normal range of motion.  Neurological: He is alert.  Skin: Skin is warm.    Urgent Care Course     Procedures (including critical care time)  Labs Review Labs Reviewed - No data to display  Imaging Review No results found.           MDM   1. Medication refill   2. Bilateral impacted cerumen   3. Essential hypertension    You will need to follow up with your primary doctor Go to the Emergency room if you begin to have chest pain, sob, weakness, facial droop  Cautious with sitting to standing May use ear drops and warm water for ear wax do not up  anything sharp into the ears     Tobi Bastos, NP 06/27/17 1846    Tobi Bastos, NP 06/27/17 [phone]

## 2017-06-27 NOTE — Discharge Instructions (Signed)
You will need to follow up with your primary doctor Go to the Emergency room if you begin to have chest pain, sob, weakness, facial droop  Cautious with sitting to standing May use ear drops and warm water for ear wax do not up anything sharp into the ears

## 2017-06-27 NOTE — ED Triage Notes (Signed)
Patient needs refill on lisinopril/hctz and tamsulosin. Has been out for about 2 months. Pt reports blood pressure has been elevated. Also c/o bilat ear fullness x 2 months.

## 2017-09-27 ENCOUNTER — Telehealth: Payer: Self-pay | Admitting: Oncology

## 2017-09-27 NOTE — Telephone Encounter (Signed)
Spoke with patient re new patient appointment with KC (FS) 11/19 @ 1 pm.

## 2017-10-09 ENCOUNTER — Encounter: Payer: Self-pay | Admitting: Oncology

## 2017-10-09 NOTE — Progress Notes (Signed)
[facility] Initial Visit:  Patient Care Team: Shanon Rosser, PA-C as PCP - General (Physician Assistant)  REASON FOR CONSULTATION: Anemia  HPI:   Jesus Lam 83 y.o. male is an 83 year old male with a past medical history significant for hypertension, hyperlipidemia, gout, osteoarthritis, stasis dermatitis, vitamin D deficiency, and bilateral cataracts.  The patient was referred to [place] for anemia.  Recent CBC performed at his primary care provider's office dated 08/27/2017 shows a hemoglobin of 10.3, hematocrit 33.0 with a normal MCV, white blood cell count, and platelets.  Creatinine was 1.11.  The patient is generally unaware of his anemia, but does note about 2-3 years ago he tried to donate plasma and could not.  Review of the EMR shows anemia with a hemoglobin of 10.6-11.3 dating back to December 2014 when he was hospitalized for cellulitis.  The patient reports he has never had a colonoscopy.  When seen today, the patient reports that he feels well with exception of dizziness.  He has been dizzy for about 1 week.  The patient reports that he checked his blood pressure at the local pharmacy about a week ago and it was in the 80s over 54s.  Usual systolic blood pressure for him is in the 130s-140s.  The patient denies fevers and chills.  Reports occasional night sweats 2-3 times a week.  Denies chest pain, shortness of breath at rest, cough, hemoptysis.  He does report dyspnea on exertion.  Denies abdominal pain,  nausea, vomiting, constipation, diarrhea.  Denies headaches and visual changes.  Appetite is fair, but he has not lost any weight.  Denies bleeding.  The remaining comprehensive review of systems is negative.  Review of Systems  Constitutional: Positive for appetite change. Negative for chills, fatigue, fever and unexpected weight change.  HENT:  Negative.   Eyes: Negative.   Respiratory: Negative for chest tightness, cough and hemoptysis.        Denies shortness  of breath at rest.  Reports dyspnea on exertion.  Cardiovascular: Negative for chest pain, leg swelling and palpitations.  Gastrointestinal: Negative for abdominal distention, abdominal pain, blood in stool, constipation, diarrhea, nausea and vomiting.  Genitourinary: Negative for bladder incontinence, difficulty urinating, dyspareunia, dysuria, frequency, hematuria and nocturia.   Musculoskeletal: Negative.   Skin: Negative.   Neurological: Positive for dizziness. Negative for extremity weakness, headaches, numbness and seizures.  Hematological: Negative for adenopathy. Does not bruise/bleed easily.  Psychiatric/Behavioral: Negative.     MEDICAL HISTORY: Past Medical History:  Diagnosis Date  . BPH (benign prostatic hyperplasia)   . Cataracts, bilateral   . Dyslipidemia   . Gout   . HTN (hypertension)   . Osteoarthritis   . Stasis dermatitis   . Vitamin D deficiency     SURGICAL HISTORY: Past Surgical History:  Procedure Laterality Date  . CATARACT EXTRACTION, BILATERAL      SOCIAL HISTORY: Social History   Socioeconomic History  . Marital status: Single    Spouse name: Not on file  . Number of children: Not on file  . Years of education: Not on file  . Highest education level: Not on file  Social Needs  . Financial resource strain: Not on file  . Food insecurity - worry: Not on file  . Food insecurity - inability: Not on file  . Transportation needs - medical: Not on file  . Transportation needs - non-medical: Not on file  Occupational History  . Not on file  Tobacco Use  . Smoking status:  Never Smoker  . Smokeless tobacco: Never Used  Substance and Sexual Activity  . Alcohol use: No    Frequency: Never    Comment: occasionally  . Drug use: Yes    Types: Marijuana    Comment: 1 oz. per week  . Sexual activity: Not on file  Other Topics Concern  . Not on file  Social History Narrative   Lives alone. 2 daughters live here in [place]. Son lives in  [place].    FAMILY HISTORY Family History  Problem Relation Age of Onset  . Cancer Mother   . Cancer Father   . Alcohol abuse Father   . Breast cancer Daughter   . CVA Daughter     ALLERGIES:  is allergic to shellfish allergy.  MEDICATIONS:  Current Outpatient Medications  Medication Sig Dispense Refill  . allopurinol (ZYLOPRIM) 300 MG tablet Take 300 mg by mouth daily.    . carbamide peroxide (DEBROX) 6.5 % OTIC solution Place 5 drops into both ears 2 (two) times daily. 15 mL 0  . cholecalciferol (VITAMIN D) 1000 units tablet Take 1,000 Units daily by mouth.    . furosemide (LASIX) 40 MG tablet Take 40 mg by mouth.    . gabapentin (NEURONTIN) 300 MG capsule Take 300 mg by mouth at bedtime.    Marland Kitchen lisinopril-hydrochlorothiazide (PRINZIDE,ZESTORETIC) 20-12.5 MG tablet Take 1 tablet by mouth daily. 30 tablet 1  . Multiple Vitamin (MULTIVITAMIN) capsule Take 1 capsule daily by mouth.    . oxyCODONE-acetaminophen (PERCOCET/ROXICET) 5-325 MG per tablet Take 1-2 tablets by mouth every 4 (four) hours as needed for severe pain. 50 tablet 0  . tamsulosin (FLOMAX) 0.4 MG CAPS capsule Take 1 capsule (0.4 mg total) by mouth daily after supper. 30 capsule 1  . vitamin C (ASCORBIC ACID) 500 MG tablet Take 500 mg daily by mouth.    Marland Kitchen NIFEdipine (PROCARDIA-XL/ADALAT-CC/NIFEDICAL-XL) 30 MG 24 hr tablet Take 30 mg by mouth every evening.    . traMADol-acetaminophen (ULTRACET) 37.5-325 MG per tablet Take 2 tablets by mouth 3 (three) times daily as needed for moderate pain. Take this when you are not taking the Oxycodone (Percocet).  Do not take them together. (Patient not taking: Reported on 10/10/2017) 30 tablet 0   No current facility-administered medications for this visit.     PHYSICAL EXAMINATION:   Vitals:   10/10/17 1311  BP: (!) 81/42  Pulse: 90  Resp: 17  Temp: 98.4 F (36.9 C)  SpO2: 98%    Filed Weights   10/10/17 1311  Weight: 235 lb 4.8 oz (106.7 kg)   Repeat blood  pressure was 95/39, pulse 62  Physical Exam  Constitutional: He is oriented to person, place, and time and well-developed, well-nourished, and in no distress. No distress.  HENT:  Head: Normocephalic and atraumatic.  Mouth/Throat: Oropharynx is clear and moist. No oropharyngeal exudate.  Eyes: Conjunctivae and EOM are normal. Pupils are equal, round, and reactive to light. Right eye exhibits no discharge. Left eye exhibits no discharge. No scleral icterus.  Neck: Normal range of motion. Neck supple. No JVD present. No tracheal deviation present.  Cardiovascular: Normal rate, regular rhythm, normal heart sounds and intact distal pulses. Exam reveals no gallop and no friction rub.  No murmur heard. Pulmonary/Chest: Effort normal and breath sounds normal. No stridor. No respiratory distress. He has no wheezes. He has no rales.  Abdominal: Soft. Bowel sounds are normal. He exhibits no distension and no mass. There is no tenderness.  Musculoskeletal: Normal  range of motion. He exhibits no edema.  Lymphadenopathy:    He has no cervical adenopathy.  Neurological: He is alert and oriented to person, place, and time. No cranial nerve deficit. He exhibits normal muscle tone. Coordination normal.  Skin: Skin is warm and dry. No rash noted. He is not diaphoretic. No erythema. No pallor.  Psychiatric: Mood, memory, affect and judgment normal.  Vitals reviewed.    LABORATORY DATA: I have personally reviewed the data as listed:  No visits with results within 1 Month(s) from this visit.  Latest known visit with results is:  Admission on 11/04/2013, Discharged on 11/09/2013  Component Date Value Ref Range Status  . WBC 11/04/2013 10.6* 4.0 - 10.5 K/uL Final  . RBC 11/04/2013 3.73* 4.22 - 5.81 MIL/uL Final  . Hemoglobin 11/04/2013 11.3* 13.0 - 17.0 g/dL Final  . HCT 11/04/2013 33.7* 39.0 - 52.0 % Final  . MCV 11/04/2013 90.3  78.0 - 100.0 fL Final  . MCH 11/04/2013 30.3  26.0 - 34.0 pg Final  .  MCHC 11/04/2013 33.5  30.0 - 36.0 g/dL Final  . RDW 11/04/2013 13.9  11.5 - 15.5 % Final  . Platelets 11/04/2013 254  150 - 400 K/uL Final  . Sodium 11/04/2013 135  135 - 145 mEq/L Final  . Potassium 11/04/2013 4.0  3.5 - 5.1 mEq/L Final  . Chloride 11/04/2013 100  96 - 112 mEq/L Final  . CO2 11/04/2013 23  19 - 32 mEq/L Final  . Glucose, Bld 11/04/2013 113* 70 - 99 mg/dL Final  . BUN 11/04/2013 12  6 - 23 mg/dL Final  . Creatinine, Ser 11/04/2013 0.95  0.50 - 1.35 mg/dL Final  . Calcium 11/04/2013 9.2  8.4 - 10.5 mg/dL Final  . GFR calc non Af Amer 11/04/2013 78* >90 mL/min Final  . GFR calc Af Amer 11/04/2013 >90  >90 mL/min Final   Comment: (NOTE)                          The eGFR has been calculated using the CKD EPI equation.                          This calculation has not been validated in all clinical situations.                          eGFR's persistently <90 mL/min signify possible Chronic Kidney                          Disease.  . WBC 11/05/2013 10.1  4.0 - 10.5 K/uL Final  . RBC 11/05/2013 3.60* 4.22 - 5.81 MIL/uL Final  . Hemoglobin 11/05/2013 10.8* 13.0 - 17.0 g/dL Final  . HCT 11/05/2013 32.5* 39.0 - 52.0 % Final  . MCV 11/05/2013 90.3  78.0 - 100.0 fL Final  . MCH 11/05/2013 30.0  26.0 - 34.0 pg Final  . MCHC 11/05/2013 33.2  30.0 - 36.0 g/dL Final  . RDW 11/05/2013 13.9  11.5 - 15.5 % Final  . Platelets 11/05/2013 265  150 - 400 K/uL Final  . Sodium 11/05/2013 137  135 - 145 mEq/L Final  . Potassium 11/05/2013 3.8  3.5 - 5.1 mEq/L Final  . Chloride 11/05/2013 104  96 - 112 mEq/L Final  . CO2 11/05/2013 23  19 - 32 mEq/L Final  . Glucose, Bld  11/05/2013 104* 70 - 99 mg/dL Final  . BUN 11/05/2013 11  6 - 23 mg/dL Final  . Creatinine, Ser 11/05/2013 0.94  0.50 - 1.35 mg/dL Final  . Calcium 11/05/2013 8.9  8.4 - 10.5 mg/dL Final  . GFR calc non Af Amer 11/05/2013 78* >90 mL/min Final  . GFR calc Af Amer 11/05/2013 >90  >90 mL/min Final   Comment: (NOTE)                           The eGFR has been calculated using the CKD EPI equation.                          This calculation has not been validated in all clinical situations.                          eGFR's persistently <90 mL/min signify possible Chronic Kidney                          Disease.  . WBC 11/06/2013 10.6* 4.0 - 10.5 K/uL Final  . RBC 11/06/2013 3.63* 4.22 - 5.81 MIL/uL Final  . Hemoglobin 11/06/2013 11.0* 13.0 - 17.0 g/dL Final  . HCT 11/06/2013 33.3* 39.0 - 52.0 % Final  . MCV 11/06/2013 91.7  78.0 - 100.0 fL Final  . MCH 11/06/2013 30.3  26.0 - 34.0 pg Final  . MCHC 11/06/2013 33.0  30.0 - 36.0 g/dL Final  . RDW 11/06/2013 13.9  11.5 - 15.5 % Final  . Platelets 11/06/2013 347  150 - 400 K/uL Final  . Vancomycin Tr 11/06/2013 8.5* 10.0 - 20.0 ug/mL Final  . Sodium 11/09/2013 137  135 - 145 mEq/L Final  . Potassium 11/09/2013 4.1  3.5 - 5.1 mEq/L Final  . Chloride 11/09/2013 101  96 - 112 mEq/L Final  . CO2 11/09/2013 25  19 - 32 mEq/L Final  . Glucose, Bld 11/09/2013 110* 70 - 99 mg/dL Final  . BUN 11/09/2013 9  6 - 23 mg/dL Final  . Creatinine, Ser 11/09/2013 0.99  0.50 - 1.35 mg/dL Final  . Calcium 11/09/2013 9.4  8.4 - 10.5 mg/dL Final  . GFR calc non Af Amer 11/09/2013 76* >90 mL/min Final  . GFR calc Af Amer 11/09/2013 89* >90 mL/min Final   Comment: (NOTE)                          The eGFR has been calculated using the CKD EPI equation.                          This calculation has not been validated in all clinical situations.                          eGFR's persistently <90 mL/min signify possible Chronic Kidney                          Disease.  . WBC 11/09/2013 10.1  4.0 - 10.5 K/uL Final  . RBC 11/09/2013 3.53* 4.22 - 5.81 MIL/uL Final  . Hemoglobin 11/09/2013 10.6* 13.0 - 17.0 g/dL Final  . HCT 11/09/2013 32.6* 39.0 - 52.0 % Final  . MCV 11/09/2013 92.4  78.0 - 100.0  fL Final  . MCH 11/09/2013 30.0  26.0 - 34.0 pg Final  . MCHC 11/09/2013 32.5  30.0 - 36.0 g/dL  Final  . RDW 11/09/2013 14.1  11.5 - 15.5 % Final  . Platelets 11/09/2013 381  150 - 400 K/uL Final    ASSESSMENT/PLAN:  Anemia This is a pleasant 83 year old African-American male who was referred to [place] for anemia.  Anemia has been stable for at least 4 years.  White blood cell count, MCV, platelets, and differential are all normal.  Calculated GFR is 53 consistent with stage III chronic kidney disease.  Anemia is likely due to chronic disease.  The patient was seen with Dr Alen Blew who had a discussion with the patient regarding the current findings and possible cause for the anemia.  Explained to the patient that his anemia has been stable for at least 4 years.  He is dizzy, but not likely due to his anemia.  His dizziness is likely due to his hypotension.  For the anemia, plan is to monitor the patient.  He will have a repeat CBC in 6 months.  If his hemoglobin continues to drop, may consider growth factor support such as Procrit or Aranesp.  The patient is hypotensive and is on several blood pressure medications.  I have placed a call to [facility] which is his primary care provider.  They have stated that he may walk into their office today for evaluation and management of his blood pressure.  The patient was instructed to go to [facility] following this appointment.  Follow-up visit will be in 6 months.  All questions were answered. The patient knows to call the clinic with any problems, questions or concerns.   Orders Placed This Encounter  Procedures  . CBC with Differential/Platelet    Standing Status:   Future    Standing Expiration Date:   10/10/2018      Mikey Bussing, NP  10/10/2017 2:32 PM   Patient seen and examined personally.  This is a pleasant 83 year old gentleman without any specific symptoms.  He denies any chest pain or difficulty breathing.  He denies any exertional dyspnea.  He has history of chronic dates back to at least 2014.   He denies any hematochezia or melena.  His physical examination revealed awake gentleman without distress.  His heart exam showed was regular rate and rhythm, lungs are clear his abdomen was soft without rebound or guarding.  History reviewed last 4 years including CBC and chemistries.  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  He is asymptomatic at this time at this time requiring intervention.  I recommended continued observation and surveillance and repeat laboratory testing in 6 months.  Growth factor support could be considered future if his hemoglobin drops further.  Zola Button MD 10/10/17

## 2017-10-10 ENCOUNTER — Encounter: Payer: Self-pay | Admitting: Oncology

## 2017-10-10 ENCOUNTER — Telehealth: Payer: Self-pay | Admitting: Oncology

## 2017-10-10 ENCOUNTER — Ambulatory Visit (HOSPITAL_BASED_OUTPATIENT_CLINIC_OR_DEPARTMENT_OTHER): Payer: Medicare HMO | Admitting: Oncology

## 2017-10-10 VITALS — BP 81/42 | HR 90 | Temp 98.4°F | Resp 17 | Ht 72.0 in | Wt 235.3 lb

## 2017-10-10 DIAGNOSIS — R42 Dizziness and giddiness: Secondary | ICD-10-CM

## 2017-10-10 DIAGNOSIS — N183 Chronic kidney disease, stage 3 (moderate): Secondary | ICD-10-CM

## 2017-10-10 DIAGNOSIS — D631 Anemia in chronic kidney disease: Secondary | ICD-10-CM

## 2017-10-10 DIAGNOSIS — I959 Hypotension, unspecified: Secondary | ICD-10-CM

## 2017-10-10 DIAGNOSIS — D649 Anemia, unspecified: Secondary | ICD-10-CM | POA: Insufficient documentation

## 2017-10-10 NOTE — Assessment & Plan Note (Signed)
This is a pleasant 83 year old African-American male who was referred to [place] for anemia.  Anemia has been stable for at least 4 years.  White blood cell count, MCV, platelets, and differential are all normal.  Calculated GFR is 53 consistent with stage III chronic kidney disease.  Anemia is likely due to chronic disease.  The patient was seen with Dr Clelia Croft who had a discussion with the patient regarding the current findings and possible cause for the anemia.  Explained to the patient that his anemia has been stable for at least 4 years.  He is dizzy, but not likely due to his anemia.  His dizziness is likely due to his hypotension.  For the anemia, plan is to monitor the patient.  He will have a repeat CBC in 6 months.  If his hemoglobin continues to drop, may consider growth factor support such as Procrit or Aranesp.  The patient is hypotensive and is on several blood pressure medications.  I have placed a call to [facility] which is his primary care provider.  They have stated that he may walk into their office today for evaluation and management of his blood pressure.  The patient was instructed to go to [facility] following this appointment.  Follow-up visit will be in 6 months.

## 2017-10-10 NOTE — Telephone Encounter (Signed)
Patient declined AVS. Gave patient calendar of upcoming May appointments.

## 2018-01-08 ENCOUNTER — Telehealth: Payer: Self-pay | Admitting: *Deleted

## 2018-01-11 DIAGNOSIS — I739 Peripheral vascular disease, unspecified: Secondary | ICD-10-CM

## 2018-01-11 NOTE — Progress Notes (Signed)
Labs 01/07/2018: Glucose 101.  BUN/creatinine 11/1.1.  EGFR 62. H/H 10.9/35.2.  MCV 92.  Platelets 202 INR 1.0

## 2018-01-11 NOTE — H&P (Signed)
Jesus Lam 2018/01/21 2:00 PM Location: [facility] Patient #: [id number] DOB: [birth year] Widowed / Language: Cleophus Lam / Race: Black or African American Male   History of Present Illness [name] Jesus Rosero MD; 01/08/2018 7:20 AM) Patient words: 4 week f/u for nuc results; Last OV 12/03/2017.  The patient is a 84 year old male presenting to discuss test results. 84 year old African-American initially referred for abnormal EKG showing first-degree AV block and IVCD. He does have exertional dyspnea and bilateral class III claudication. Workup shows mildly reduced EF without congestive heart failiure. Claudication bilateral reduced ABI.   He is fairly active for his age and regularly exercises. He swims 7 laps of [employer] size pool. He complains of mild shortness of breath with this activity. His biggest complaint is bilateral knee and leg pain that occurs with climbing stairs. Workup so far showed mildly reduced EF, severely reduced bilateral ABI. I also performed a nuclear stress test given his age and risk factors, that showed concern for large size inferior infarct.  He has been walking regularly, and has been compliant with cilostazol. However, his claudication has not improved. He wants to stay active and is requesting intervention that can be performed.  He denies any chest pain. leg edema, orthopnea, PND, presncope, syncope, TIA. He does have bilateral leg edema.    Problem List/Past Medical Jesus Lam; 01-21-2018 1:30 PM) Gout (M10.9)  Laboratory examination (Z01.89)  Labs 08/28/2017: H/H 10.3/33. MCV 93. 208. Glucose 101. BUN/creatinine 20/1.1. EGFR 62/71. Sodium 145, potassium 5.0. Hemoglobin A1c 5.3 TSH 0.1 below normal. PSA 5.1 above normal Benign essential hypertension (I10)  EKG 11/06/2017: Sinus rhythm 68 bpm. Borderline left axis deviation first-degree AV block. Poor R-wave progression. IVCD. Peripheral artery disease (I73.9)  Lower extremity  arterial duplex 11/19/2017: Moderate velocity increase at the right distal superficial femoral artery suggests > 50% stenosis. Significant velocity increase at the left distal superficial femoral artery with > 70% stenosis. This exam reveals moderately decreased perfusion of the lower extremity,with LABI 0.67 and RABI 0.59 noted at the dorsalis pedis and post tibial artery level (ABI). Carotid bruit (R09.89)  Carotid artery duplex 11/20/2017: No hemodynamically significant arterial disease in bilateral internal carotid artery. Antegrade right vertebral artery flow. Antegrade left vertebral artery flow. Normal study. Shortness of breath (R06.02)  Lexiscan myoview stress test 12/29/2017: 1. Resting EKG demonstrates normal sinus rhythm, left axis deviation, LVH. Stress EKG is non-diagnostic for ischemia as it a pharmacologic stress using Lexiscan. Stress symptoms included dizziness. 2. The LV is dilated both at rest and stress images. The LV end diastolic volume was 948 mL. There is a large area of infarction in the basal inferior, basal inferolateral, mid inferior, mid inferolateral, apical inferior and apical lateral myocardial wall(s). Gated SPECT imaging demonstrates hypokinesis of the inferior and lateral myocardial wall(s). The left ventricular ejection fraction was calculated or visually estimated to be 46%. This is an intermediate risk study, clinical correlation recommended.  Allergies Jesus Lam; 01-21-2018 1:30 PM) No Known Drug Allergies [11/06/2017]:  Family History Jesus Lam; 01-21-2018 1:30 PM) Mother  Deceased. at age 60; cancer Father  Deceased. at age 68; minor heart issues Sister 4  older; all deceased; 34 had MI and stroke Brother 1  older; Deceased; MI  Social History Jesus Lam; 01/21/2018 1:30 PM) Current tobacco use  Never smoker. smokes marijuana Alcohol Use  Occasional alcohol use. father was a alcoholic Marital status  Divorced. Living Situation  Lives  alone. Number of Children  3.  Past Surgical  History Jesus Lam; 01-28-2018 1:30 PM) Cataract Extraction-Bilateral [2017]:  Medication History Jesus Lam; 01-28-2018 1:36 PM) Lipitor (80MG Tablet, 1 (one) Tablet Oral daily at bedtime, Taken starting 12/03/2017) Active. Aspirin 81 (81MG Tablet Chewable, 1 (one) Tablet Tablet Oral once daily, Taken starting 11/06/2017) Active. Terbinafine HCl (250MG Tablet, 1 Oral daily) Active. Allopurinol (100MG Tablet, 1 Oral three times daily) Active. Tamsulosin HCl (0.4MG Capsule, 1 Oral daily) Active. Multivitamin Adults 50+ (1 Oral daily) Active. Vitamin D3 (1000UNIT Tablet, 1 Oral daily) Active. Medications Reconciled (verbally with pt; no list or medication present)  Diagnostic Studies History Jesus Lam; 2018/01/28 1:30 PM) Nuclear stress test  12/29/2017: 1. Resting EKG demonstrates normal sinus rhythm, left axis deviation, LVH. Stress EKG is non-diagnostic for ischemia as it a pharmacologic stress using Lexiscan. Stress symptoms included dizziness. 2. The LV is dilated both at rest and stress images. The LV end diastolic volume was 409 mL. There is a large area of infarction in the basal inferior, basal inferolateral, mid inferior, mid inferolateral, apical inferior and apical lateral myocardial wall(s). Gated SPECT imaging demonstrates hypokinesis of the inferior and lateral myocardial wall(s). The left ventricular ejection fraction was calculated or visually estimated to be 46%. This is an intermediate risk study, clinical correlation recommended.    Review of Systems Jesus Leep, MD; 01/08/2018 7:23 AM) General Not Present- Appetite Loss and Weight Gain. Respiratory Not Present- Chronic Cough and Wakes up from Sleep Wheezing or Short of Breath. Cardiovascular Present- Claudications and Difficulty Breathing On Exertion. Not Present- Chest Pain, Difficulty Breathing Lying Down, Edema, Fainting, Orthopnea,  Palpitations and Paroxysmal Nocturnal Dyspnea. Gastrointestinal Not Present- Black, Tarry Stool and Difficulty Swallowing. Musculoskeletal Not Present- Decreased Range of Motion and Muscle Atrophy. Neurological Not Present- Attention Deficit. Psychiatric Not Present- Personality Changes and Suicidal Ideation. Endocrine Not Present- Cold Intolerance and Heat Intolerance. Hematology Not Present- Abnormal Bleeding. All other systems negative  Vitals Jesus Lam; Jan 28, 2018 1:37 PM) 01-28-18 1:32 PM Weight: 240.38 lb Height: 72in Body Surface Area: 2.3 m Body Mass Index: 32.6 kg/m  Pulse: 84 (Regular)  P.OX: 97% (Room air) BP: 132/72 (Sitting, Left Arm, Standard)       Physical Exam [name] Tamaj Jurgens MD; 01/08/2018 7:19 AM) General Mental Status-Alert. General Appearance-Cooperative and Appears stated age. Build & Nutrition-Moderately built.  Head and Neck Thyroid Gland Characteristics - normal size and consistency and no palpable nodules.  Chest and Lung Exam Chest and lung exam reveals -quiet, even and easy respiratory effort with no use of accessory muscles, non-tender and on auscultation, normal breath sounds, no adventitious sounds.  Cardiovascular Cardiovascular examination reveals -normal heart sounds, regular rate and rhythm with no murmurs, carotid auscultation reveals no bruits and abdominal aorta auscultation reveals no bruits and no prominent pulsation.  Abdomen Palpation/Percussion Normal exam - Non Tender and No hepatosplenomegaly.  Peripheral Vascular Lower Extremity Palpation - Edema - Bilateral - 1+ Pitting edema. Femoral pulse - Bilateral - 2+. Popliteal pulse - Bilateral - Feeble. Dorsalis pedis pulse - Bilateral - Feeble. Posterior tibial pulse - Bilateral - Feeble. Auscultation - Note: bilateral thigh bruit. Carotid arteries - Right-Soft Bruit.  Neurologic Neurologic evaluation reveals -alert and oriented x 3 with no  impairment of recent or remote memory. Motor-Grossly intact without any focal deficits.  Musculoskeletal Global Assessment Left Lower Extremity - no deformities, masses or tenderness, no known fractures. Right Lower Extremity - no deformities, masses or tenderness, no known fractures.   Results Jesus Leep MD; 01/08/2018 7:23 AM) Procedures  Name Value Date  Complete duplex scan of bilateral carotid arteries (62952) Comments: Carotid artery duplex 11/20/2017: No hemodynamically significant arterial disease in bilateral internal carotid artery. Antegrade right vertebral artery flow. Antegrade left vertebral artery flow. Normal study.  Performed: 11/20/2017 11:36 AM Echocardiography, transthoracic, real-time with image documentation (2D), includes M-mode recording, when performed, complete, with spectral Doppler echocardiography, and with color flow Doppler echocardiography (84132) Comments: Echocardiogram 11/20/2017: Left ventricle cavity is normal in size. Mild concentric hypertrophy of the left ventricle. Mild decrease in global wall motion. Visual LVEF 45-50%. Doppler evidence of grade I (impaired) diastolic dysfunction, normal LAP. Mild (Grade I) aortic regurgitation. Mild aortic valve leaflet thickening. Mild to moderate mitral regurgitation. Inadequate tricuspid regurgitation jet to estimate pulmonary artery pressure. Normal right atrial pressure.  Performed: 11/20/2017 11:37 AM Complete duplex ultrasound of arteries of lower extremity (44010) Comments: Lower extremity arterial duplex 11/19/2017: Moderate velocity increase at the right distal superficial femoral artery suggests > 50% stenosis. Significant velocity increase at the left distal superficial femoral artery with > 70% stenosis. This exam reveals moderately decreased perfusion of the lower extremity,with LABI 0.67 and RABI 0.59 noted at the dorsalis pedis and post tibial artery level (ABI).  Performed:  11/19/2017 11:37 AM Myocardial perfusion imaging, tomographic (SPECT) (including attenuation correction, qualitative or quantitative wall motion, ejection fraction by first pass or gated technique, additional quantification, when performed); multiple studies, Comments: Lexiscan myoview stress test 12/29/2017: 1. Resting EKG demonstrates normal sinus rhythm, left axis deviation, LVH. Stress EKG is non-diagnostic for ischemia as it a pharmacologic stress using Lexiscan. Stress symptoms included dizziness. 2. The LV is dilated both at rest and stress images. The LV end diastolic volume was 272 mL. There is a large area of infarction in the basal inferior, basal inferolateral, mid inferior, mid inferolateral, apical inferior and apical lateral myocardial wall(s). Gated SPECT imaging demonstrates hypokinesis of the inferior and lateral myocardial wall(s). The left ventricular ejection fraction was calculated or visually estimated to be 46%. This is an intermediate risk study, clinical correlation recommended.  Performed: 12/29/2017    Assessment & Plan [facility] Sapna Padron MD; 01/08/2018 7:23 AM) Severe claudication (I73.9) Story: Lower extremity arterial duplex 11/19/2017: Moderate velocity increase at the right distal superficial femoral artery suggests > 50% stenosis. Significant velocity increase at the left distal superficial femoral artery with > 70% stenosis. This exam reveals moderately decreased perfusion of the lower extremity,with LABI 0.67 and RABI 0.59 noted at the dorsalis pedis and post tibial artery level (ABI). Abnormal stress test (R94.39) Story: Lexiscan myoview stress test 12/29/2017: 1. Resting EKG demonstrates normal sinus rhythm, left axis deviation, LVH. Stress EKG is non-diagnostic for ischemia as it a pharmacologic stress using Lexiscan. Stress symptoms included dizziness. 2. The LV is dilated both at rest and stress images. The LV end diastolic volume was 536 mL. There is a  large area of infarction in the basal inferior, basal inferolateral, mid inferior, mid inferolateral, apical inferior and apical lateral myocardial wall(s). Gated SPECT imaging demonstrates hypokinesis of the inferior and lateral myocardial wall(s). The left ventricular ejection fraction was calculated or visually estimated to be 46%. This is an intermediate risk study, clinical correlation recommended. Benign essential hypertension (I10) Story: EKG 11/06/2017: Sinus rhythm 68 bpm. Borderline left axis deviation first-degree AV block. Poor R-wave progression. IVCD. Impression: Echocardiogram 11/20/2017: Left ventricle cavity is normal in size. Mild concentric hypertrophy of the left ventricle. Mild decrease in global wall motion. Visual LVEF 45-50%. Doppler evidence of grade I (impaired) diastolic dysfunction, normal LAP. Mild (Grade  I) aortic regurgitation. Mild aortic valve leaflet thickening. Mild to moderate mitral regurgitation. Inadequate tricuspid regurgitation jet to estimate pulmonary artery pressure. Normal right atrial pressure. Current Plans Started Metoprolol Succinate ER 25MG, 1 (one) Tablet once daily, #30, 30 days starting 01/07/2018, Ref. x1. Shortness of breath (R06.02) Story: Lexiscan myoview stress test 12/29/2017: 1. Resting EKG demonstrates normal sinus rhythm, left axis deviation, LVH. Stress EKG is non-diagnostic for ischemia as it a pharmacologic stress using Lexiscan. Stress symptoms included dizziness. 2. The LV is dilated both at rest and stress images. The LV end diastolic volume was 832 mL. There is a large area of infarction in the basal inferior, basal inferolateral, mid inferior, mid inferolateral, apical inferior and apical lateral myocardial wall(s). Gated SPECT imaging demonstrates hypokinesis of the inferior and lateral myocardial wall(s). The left ventricular ejection fraction was calculated or visually estimated to be 46%. This is an intermediate risk study,  clinical correlation recommended. Current Plans METABOLIC PANEL, BASIC (54982) CBC & PLATELETS (AUTO) (64158) Bilateral leg edema (R60.0) Current Plans Started Lasix 20MG, 1 (one) Tablet once daily, #30, 30 days starting 01/07/2018, Ref. x1. Laboratory examination (X09.40) Story: Labs 08/28/2017: H/H 10.3/33. MCV 93. 208. Glucose 101. BUN/creatinine 20/1.1. EGFR 62/71. Sodium 145, potassium 5.0. Hemoglobin A1c 5.3 TSH 0.1 below normal. PSA 5.1 above normal Current Plans PT (PROTHROMBIN TIME) (76808) Note:Recommendations:  84 year old African-American initially referred for abnormal EKG showing first-degree AV block and IVCD. He does have exertional dyspnea and bilateral class III claudication. EF 45% on echocardiogram with mild global hypokinesis, large inferior infarct on increased stress, and severely reduced bilateral ABI 0.59 and 0.67.  My suspicion for coronary artery disease is low, however, given significant abnormality on stress tests it would be important to evaluate his coronary anatomy should he need any surgical intervention for his peripheral artery disease. I will plan on performing diagnostic coronary angiogram, followed by peripheral angiogram and consideration for angioplasty. I suspect he has bilateral distal SFA stenoses.  Continue aspirin, statin, and cilostazol at this time. I have added metoprolol succinate 25 mg to treat his mild hypertension and possible coronary artery disease. I have also added Lasix 20 mg daily.  Cc Shanon Rosser, [place]  Signed electronically by Jesus Leep, MD (01/08/2018 7:24 AM)

## 2018-01-12 NOTE — Telephone Encounter (Signed)
Left message to call back if interested in research. No call back.

## 2018-01-13 ENCOUNTER — Ambulatory Visit (HOSPITAL_COMMUNITY)
Admission: RE | Disposition: A | Discharge status: Home / Self Care | Payer: Self-pay | Source: Ambulatory Visit | Attending: Cardiology

## 2018-01-13 ENCOUNTER — Ambulatory Visit (HOSPITAL_COMMUNITY)
Admission: RE | Admit: 2018-01-13 | Discharge: 2018-01-13 | Disposition: A | Discharge status: Home / Self Care | Payer: Medicare HMO | Source: Ambulatory Visit | Attending: Cardiology | Admitting: Cardiology

## 2018-01-13 DIAGNOSIS — Z8249 Family history of ischemic heart disease and other diseases of the circulatory system: Secondary | ICD-10-CM | POA: Insufficient documentation

## 2018-01-13 DIAGNOSIS — Z7982 Long term (current) use of aspirin: Secondary | ICD-10-CM | POA: Insufficient documentation

## 2018-01-13 DIAGNOSIS — I10 Essential (primary) hypertension: Secondary | ICD-10-CM | POA: Insufficient documentation

## 2018-01-13 DIAGNOSIS — I440 Atrioventricular block, first degree: Secondary | ICD-10-CM | POA: Diagnosis not present

## 2018-01-13 DIAGNOSIS — M109 Gout, unspecified: Secondary | ICD-10-CM | POA: Diagnosis not present

## 2018-01-13 DIAGNOSIS — I2510 Atherosclerotic heart disease of native coronary artery without angina pectoris: Secondary | ICD-10-CM | POA: Insufficient documentation

## 2018-01-13 DIAGNOSIS — I70212 Atherosclerosis of native arteries of extremities with intermittent claudication, left leg: Secondary | ICD-10-CM | POA: Diagnosis not present

## 2018-01-13 DIAGNOSIS — I739 Peripheral vascular disease, unspecified: Secondary | ICD-10-CM

## 2018-01-13 HISTORY — PX: LEFT HEART CATH AND CORONARY ANGIOGRAPHY: CATH118249

## 2018-01-13 HISTORY — PX: ABDOMINAL AORTOGRAM: CATH118222

## 2018-01-13 HISTORY — PX: PERIPHERAL VASCULAR ATHERECTOMY: CATH118256

## 2018-01-13 HISTORY — PX: LOWER EXTREMITY ANGIOGRAPHY: CATH118251

## 2018-01-13 LAB — POCT ACTIVATED CLOTTING TIME
Activated Clotting Time: 180 seconds
Activated Clotting Time: 224 seconds

## 2018-01-13 SURGERY — LOWER EXTREMITY ANGIOGRAPHY
Anesthesia: LOCAL

## 2018-01-13 MED ORDER — ATORVASTATIN CALCIUM 80 MG PO TABS: 80.0000 mg | ORAL_TABLET | Freq: Every day | ORAL | Status: DC

## 2018-01-13 MED ORDER — SODIUM CHLORIDE 0.9% FLUSH: 3.0000 mL | Freq: Two times a day (BID) | INTRAVENOUS | Status: DC

## 2018-01-13 MED ORDER — IODIXANOL 320 MG/ML IV SOLN
INTRAVENOUS | Status: DC | PRN
  Administered 2018-01-13: 300 mL via INTRAVENOUS

## 2018-01-13 MED ORDER — SODIUM CHLORIDE 0.9 % IV SOLN: INTRAVENOUS | Status: DC

## 2018-01-13 MED ORDER — SODIUM CHLORIDE 0.9 % IV SOLN
INTRAVENOUS | Status: AC
  Administered 2018-01-13: 06:00:00 via INTRAVENOUS

## 2018-01-13 MED ORDER — HEPARIN (PORCINE) IN NACL 2-0.9 UNIT/ML-% IJ SOLN
INTRAMUSCULAR | Status: AC
  Filled 2018-01-13: qty 1000

## 2018-01-13 MED ORDER — AMLODIPINE BESYLATE 5 MG PO TABS: 5.0000 mg | ORAL_TABLET | Freq: Every day | ORAL | Status: DC

## 2018-01-13 MED ORDER — HYDRALAZINE HCL 20 MG/ML IJ SOLN: 5.0000 mg | INTRAMUSCULAR | Status: DC | PRN

## 2018-01-13 MED ORDER — ACETAMINOPHEN 325 MG PO TABS: 650.0000 mg | ORAL_TABLET | ORAL | Status: DC | PRN

## 2018-01-13 MED ORDER — SODIUM CHLORIDE 0.9 % IV SOLN: 250.0000 mL | INTRAVENOUS | Status: DC | PRN

## 2018-01-13 MED ORDER — CLOPIDOGREL BISULFATE 300 MG PO TABS
ORAL_TABLET | ORAL | Status: DC | PRN
  Administered 2018-01-13: 600 mg via ORAL

## 2018-01-13 MED ORDER — TAMSULOSIN HCL 0.4 MG PO CAPS: 0.4000 mg | ORAL_CAPSULE | Freq: Every day | ORAL | Status: DC

## 2018-01-13 MED ORDER — MIDAZOLAM HCL 2 MG/2ML IJ SOLN
INTRAMUSCULAR | Status: DC | PRN
  Administered 2018-01-13 (×4): 1 mg via INTRAVENOUS

## 2018-01-13 MED ORDER — SODIUM CHLORIDE 0.9% FLUSH: 3.0000 mL | INTRAVENOUS | Status: DC | PRN

## 2018-01-13 MED ORDER — ALLOPURINOL 100 MG PO TABS: 100.0000 mg | ORAL_TABLET | Freq: Three times a day (TID) | ORAL | Status: DC

## 2018-01-13 MED ORDER — MIDAZOLAM HCL 2 MG/2ML IJ SOLN
INTRAMUSCULAR | Status: AC
  Filled 2018-01-13: qty 2

## 2018-01-13 MED ORDER — ASPIRIN EC 81 MG PO TBEC: 81.0000 mg | DELAYED_RELEASE_TABLET | Freq: Every day | ORAL | Status: DC

## 2018-01-13 MED ORDER — FENTANYL CITRATE (PF) 100 MCG/2ML IJ SOLN
INTRAMUSCULAR | Status: DC | PRN
  Administered 2018-01-13: 25 ug via INTRAVENOUS
  Administered 2018-01-13: 50 ug via INTRAVENOUS
  Administered 2018-01-13 (×3): 25 ug via INTRAVENOUS

## 2018-01-13 MED ORDER — CLOPIDOGREL BISULFATE 75 MG PO TABS: 75.0000 mg | ORAL_TABLET | Freq: Every day | ORAL | 3 refills | Status: DC

## 2018-01-13 MED ORDER — HEPARIN SODIUM (PORCINE) 1000 UNIT/ML IJ SOLN
INTRAMUSCULAR | Status: AC
  Filled 2018-01-13: qty 1

## 2018-01-13 MED ORDER — CLOPIDOGREL BISULFATE 75 MG PO TABS: 75.0000 mg | ORAL_TABLET | Freq: Every day | ORAL | Status: DC

## 2018-01-13 MED ORDER — AMLODIPINE BESYLATE 5 MG PO TABS: 5.0000 mg | ORAL_TABLET | Freq: Every day | ORAL | 1 refills | Status: DC

## 2018-01-13 MED ORDER — HYDRALAZINE HCL 20 MG/ML IJ SOLN
INTRAMUSCULAR | Status: DC | PRN
  Administered 2018-01-13: 10 mg via INTRAVENOUS

## 2018-01-13 MED ORDER — GABAPENTIN 300 MG PO CAPS: 300.0000 mg | ORAL_CAPSULE | Freq: Every day | ORAL | Status: DC

## 2018-01-13 MED ORDER — ASPIRIN 81 MG PO CHEW: 81.0000 mg | CHEWABLE_TABLET | ORAL | Status: DC

## 2018-01-13 MED ORDER — LABETALOL HCL 5 MG/ML IV SOLN: 10.0000 mg | INTRAVENOUS | Status: DC | PRN

## 2018-01-13 MED ORDER — LIDOCAINE HCL 1 % IJ SOLN
INTRAMUSCULAR | Status: AC
  Filled 2018-01-13: qty 20

## 2018-01-13 MED ORDER — FENTANYL CITRATE (PF) 100 MCG/2ML IJ SOLN
INTRAMUSCULAR | Status: AC
  Filled 2018-01-13: qty 2

## 2018-01-13 MED ORDER — LIDOCAINE HCL (PF) 1 % IJ SOLN
INTRAMUSCULAR | Status: DC | PRN
  Administered 2018-01-13: 15 mL

## 2018-01-13 MED ORDER — HYDRALAZINE HCL 20 MG/ML IJ SOLN
INTRAMUSCULAR | Status: AC
  Filled 2018-01-13: qty 1

## 2018-01-13 MED ORDER — HEPARIN (PORCINE) IN NACL 2-0.9 UNIT/ML-% IJ SOLN
INTRAMUSCULAR | Status: AC | PRN
  Administered 2018-01-13: 1000 mL

## 2018-01-13 MED ORDER — ONDANSETRON HCL 4 MG/2ML IJ SOLN: 4.0000 mg | Freq: Four times a day (QID) | INTRAMUSCULAR | Status: DC | PRN

## 2018-01-13 MED ORDER — CLOPIDOGREL BISULFATE 300 MG PO TABS
ORAL_TABLET | ORAL | Status: AC
  Filled 2018-01-13: qty 2

## 2018-01-13 MED ORDER — HEPARIN SODIUM (PORCINE) 1000 UNIT/ML IJ SOLN
INTRAMUSCULAR | Status: DC | PRN
  Administered 2018-01-13: 5000 [IU] via INTRAVENOUS
  Administered 2018-01-13: 7000 [IU] via INTRAVENOUS
  Administered 2018-01-13: 2000 [IU] via INTRAVENOUS

## 2018-01-13 SURGICAL SUPPLY — 36 items
BALLN COYOTE OTW 2.5X60X150 (BALLOONS) ×3
BALLN IN.PACT DCB 6X120 (BALLOONS) ×3
BALLOON COYOTE OTW 2.5X60X150 (BALLOONS) ×2 IMPLANT
CATH CXI 2.3F 135 ST (CATHETERS) ×3 IMPLANT
CATH EXPO 5F MPA-1 (CATHETERS) ×3 IMPLANT
CATH HAWKONE LX EXTENDED TIP (CATHETERS) ×3 IMPLANT
CATH INFINITI 5 FR AL2 (CATHETERS) ×3 IMPLANT
CATH INFINITI 5 FR MPA2 (CATHETERS) ×3 IMPLANT
CATH INFINITI 5FR JL5 (CATHETERS) ×3 IMPLANT
CATH INFINITI 5FR MULTPACK ANG (CATHETERS) ×3 IMPLANT
CATH OMNI FLUSH 5F 65CM (CATHETERS) ×3 IMPLANT
COVER PRB 48X5XTLSCP FOLD TPE (BAG) ×2 IMPLANT
COVER PROBE 5X48 (BAG) ×1
DCB IN.PACT 6X120 (BALLOONS) ×2 IMPLANT
DEVICE CLOSURE MYNXGRIP 6/7F (Vascular Products) ×3 IMPLANT
DEVICE SPIDERFX EMB PROT 6MM (WIRE) ×3 IMPLANT
GUIDEWIRE ANGLED .035X150CM (WIRE) ×3 IMPLANT
KIT ENCORE 26 ADVANTAGE (KITS) ×3 IMPLANT
KIT MICROPUNCTURE NIT STIFF (SHEATH) ×3 IMPLANT
KIT PV (KITS) ×3 IMPLANT
SHEATH HIGHFLEX ANSEL 7FR 55CM (SHEATH) ×3 IMPLANT
SHEATH PINNACLE 5F 10CM (SHEATH) ×3 IMPLANT
SHEATH PINNACLE 7F 10CM (SHEATH) ×3 IMPLANT
SHIELD RADPAD SCOOP 12X17 (MISCELLANEOUS) ×3 IMPLANT
STOPCOCK MORSE 400PSI 3WAY (MISCELLANEOUS) ×3 IMPLANT
SYRINGE MEDRAD AVANTA MACH 7 (SYRINGE) ×3 IMPLANT
TAPE VIPERTRACK RADIOPAQ (MISCELLANEOUS) ×2 IMPLANT
TAPE VIPERTRACK RADIOPAQUE (MISCELLANEOUS) ×1
TRANSDUCER W/STOPCOCK (MISCELLANEOUS) ×3 IMPLANT
TRAY PV CATH (CUSTOM PROCEDURE TRAY) ×3 IMPLANT
TUBING CIL FLEX 10 FLL-RA (TUBING) ×3 IMPLANT
TUBING HIGH PRESSURE 120CM (CONNECTOR) ×3 IMPLANT
WIRE EMERALD 3MM-J .035X150CM (WIRE) ×3 IMPLANT
WIRE HI TORQ COMMND ES.014X300 (WIRE) ×3 IMPLANT
WIRE HITORQ VERSACORE ST 145CM (WIRE) ×3 IMPLANT
WIRE ROSEN-J .035X260CM (WIRE) ×3 IMPLANT

## 2018-01-13 NOTE — Interval H&P Note (Signed)
History and Physical Interval Note:  01/13/2018 7:27 AM  Jesus Lam  has presented today for surgery, with the diagnosis of claudication - abnormal stress test  The various methods of treatment have been discussed with the patient and family. After consideration of risks, benefits and other options for treatment, the patient has consented to  Procedure(s): LOWER EXTREMITY ANGIOGRAPHY (N/A) LEFT HEART CATH AND CORONARY ANGIOGRAPHY (N/A) as a surgical intervention .  The patient's history has been reviewed, patient examined, no change in status, stable for surgery.  I have reviewed the patient's chart and labs.  Questions were answered to the patient's satisfaction.    2016/2017 Appropriate Use Criteria for Coronary Revascularization Clinical Presentation: Diabetes Mellitus? Symptom Status? S/P CABG? Antianginal Therapy (# of long-acting drugs)? Results of Non-invasive testing? FFR/iFR results in all diseased vessels? Patient undergoing renal transplant? Patient undergoing percutaneous valve procedure (TAVR, MitraClip, Others)? Symptom Status:  Ischemic Symptoms  Non-invasive Testing:  Intermediate Risk  If no or indeterminate stress test, FFR/iFR results in all diseased vessels:  N/A  Diabetes Mellitus:  No  S/P CABG:  No  Antianginal therapy (number of long-acting drugs):  1  Patient undergoing renal transplant:  No  Patient undergoing percutaneous valve procedure:  No    newline 1 Vessel Disease PCI CABG  No proximal LAD involvement, No proximal left dominant LCX involvement M (6); Indication 2 M (4); Indication 2   Proximal left dominant LCX involvement A (7); Indication 5 A (7); Indication 5   Proximal LAD involvement A (7); Indication 5 A (7); Indication 5   newline 2 Vessel Disease  No proximal LAD involvement A (7); Indication 8 M (6); Indication 8   Proximal LAD involvement A (7); Indication 11 A (7); Indication 11   newline 3 Vessel Disease  Low disease complexity  (e.g., focal stenoses, SYNTAX <=22) A (7); Indication 17 A (8); Indication 17   Intermediate or high disease complexity (e.g., SYNTAX >=23) M (6); Indication 21 A (8); Indication 21   newline Left Main Disease  Isolated LMCA disease: ostial or midshaft A (7); Indication 24 A (9); Indication 24   Isolated LMCA disease: bifurcation involvement M (5); Indication 25 A (9); Indication 25   LMCA ostial or midshaft, concurrent low disease burden multivessel disease (e.g., 1-2 additional focal stenoses, SYNTAX <=22) A (7); Indication 26 A (9); Indication 26   LMCA ostial or midshaft, concurrent intermediate or high disease burden multivessel disease (e.g., 1-2 additional bifurcation stenoses, long stenoses, SYNTAX >=23) M (4); Indication 27 A (9); Indication 27   LMCA bifurcation involvement, concurrent low disease burden multivessel disease (e.g., 1-2 additional focal stenoses, SYNTAX <=22) M (5); Indication 28 A (9); Indication 28   LMCA bifurcation involvement, concurrent intermediate or high disease burden multivessel disease (e.g., 1-2 additional bifurcation stenoses, long stenoses, SYNTAX >=23) R (3); Indication 29 A (9); Indication 29        Jesus Lam

## 2018-01-13 NOTE — Progress Notes (Signed)
Dr. Rosemary Holms notified concerning patients med rec and if the patient is to go home with Plavix ordered daily.

## 2018-01-13 NOTE — Discharge Instructions (Signed)

## 2018-01-14 ENCOUNTER — Encounter (HOSPITAL_COMMUNITY): Payer: Self-pay | Admitting: Cardiology

## 2018-01-14 MED FILL — Heparin Sodium (Porcine) 2 Unit/ML in Sodium Chloride 0.9%: INTRAMUSCULAR | Qty: 1000 | Status: AC

## 2018-01-14 MED FILL — Lidocaine HCl Local Inj 1%: INTRAMUSCULAR | Qty: 20 | Status: AC

## 2018-01-17 ENCOUNTER — Encounter (HOSPITAL_COMMUNITY): Payer: Self-pay | Admitting: Emergency Medicine

## 2018-01-17 ENCOUNTER — Emergency Department (HOSPITAL_COMMUNITY)
Admission: EM | Admit: 2018-01-17 | Discharge: 2018-01-17 | Disposition: A | Discharge status: Home / Self Care | Payer: Medicare HMO | Attending: Emergency Medicine | Admitting: Emergency Medicine

## 2018-01-17 ENCOUNTER — Emergency Department (HOSPITAL_COMMUNITY): Payer: Medicare HMO

## 2018-01-17 DIAGNOSIS — Z7982 Long term (current) use of aspirin: Secondary | ICD-10-CM | POA: Insufficient documentation

## 2018-01-17 DIAGNOSIS — M25562 Pain in left knee: Secondary | ICD-10-CM | POA: Diagnosis present

## 2018-01-17 DIAGNOSIS — Z7902 Long term (current) use of antithrombotics/antiplatelets: Secondary | ICD-10-CM | POA: Insufficient documentation

## 2018-01-17 DIAGNOSIS — M10062 Idiopathic gout, left knee: Secondary | ICD-10-CM | POA: Insufficient documentation

## 2018-01-17 DIAGNOSIS — Z79899 Other long term (current) drug therapy: Secondary | ICD-10-CM | POA: Insufficient documentation

## 2018-01-17 DIAGNOSIS — I10 Essential (primary) hypertension: Secondary | ICD-10-CM | POA: Diagnosis not present

## 2018-01-17 LAB — CBC WITH DIFFERENTIAL/PLATELET
Basophils Absolute: 0 10*3/uL (ref 0.0–0.1)
Basophils Relative: 0 %
EOS ABS: 0.2 10*3/uL (ref 0.0–0.7)
EOS PCT: 3 %
HCT: 32.8 % — ABNORMAL LOW (ref 39.0–52.0)
Hemoglobin: 10.3 g/dL — ABNORMAL LOW (ref 13.0–17.0)
LYMPHS ABS: 2.2 10*3/uL (ref 0.7–4.0)
Lymphocytes Relative: 28 %
MCH: 29.3 pg (ref 26.0–34.0)
MCHC: 31.4 g/dL (ref 30.0–36.0)
MCV: 93.2 fL (ref 78.0–100.0)
MONO ABS: 0.6 10*3/uL (ref 0.1–1.0)
Monocytes Relative: 7 %
Neutro Abs: 4.9 10*3/uL (ref 1.7–7.7)
Neutrophils Relative %: 62 %
PLATELETS: 191 10*3/uL (ref 150–400)
RBC: 3.52 MIL/uL — AB (ref 4.22–5.81)
RDW: 14.2 % (ref 11.5–15.5)
WBC: 7.9 10*3/uL (ref 4.0–10.5)

## 2018-01-17 LAB — BASIC METABOLIC PANEL
Anion gap: 11 (ref 5–15)
BUN: 14 mg/dL (ref 6–20)
CHLORIDE: 108 mmol/L (ref 101–111)
CO2: 24 mmol/L (ref 22–32)
CREATININE: 1.15 mg/dL (ref 0.61–1.24)
Calcium: 9.1 mg/dL (ref 8.9–10.3)
GFR calc Af Amer: >60 mL/min (ref >60)
GFR calc non Af Amer: 57 mL/min — ABNORMAL LOW (ref >60)
Glucose, Bld: 96 mg/dL (ref 65–99)
Potassium: 4 mmol/L (ref 3.5–5.1)
SODIUM: 143 mmol/L (ref 135–145)

## 2018-01-17 LAB — SYNOVIAL CELL COUNT + DIFF, W/ CRYSTALS
Eosinophils-Synovial: 1 % (ref 0–1)
LYMPHOCYTES-SYNOVIAL FLD: 1 % (ref 0–20)
MONOCYTE-MACROPHAGE-SYNOVIAL FLUID: 4 % — AB (ref 50–90)
Neutrophil, Synovial: 94 % — ABNORMAL HIGH (ref 0–25)
WBC, Synovial: 12000 /mm3 — ABNORMAL HIGH (ref 0–200)

## 2018-01-17 LAB — GRAM STAIN: SPECIAL REQUESTS: NORMAL

## 2018-01-17 LAB — CBG MONITORING, ED: GLUCOSE-CAPILLARY: 89 mg/dL (ref 65–99)

## 2018-01-17 MED ORDER — COLCHICINE 0.6 MG PO TABS: 0.6000 mg | ORAL_TABLET | Freq: Once | ORAL | Status: DC

## 2018-01-17 MED ORDER — MORPHINE SULFATE (PF) 4 MG/ML IV SOLN
4.0000 mg | Freq: Once | INTRAVENOUS | Status: AC
  Administered 2018-01-17: 4 mg via INTRAMUSCULAR
  Filled 2018-01-17: qty 1

## 2018-01-17 MED ORDER — LIDOCAINE-EPINEPHRINE (PF) 2 %-1:200000 IJ SOLN
10.0000 mL | Freq: Once | INTRAMUSCULAR | Status: AC
  Administered 2018-01-17: 10 mL via INTRADERMAL
  Filled 2018-01-17: qty 20

## 2018-01-17 MED ORDER — OXYCODONE-ACETAMINOPHEN 5-325 MG PO TABS
1.0000 | ORAL_TABLET | Freq: Once | ORAL | Status: AC
  Administered 2018-01-17: 1 via ORAL
  Filled 2018-01-17: qty 1

## 2018-01-17 MED ORDER — COLCHICINE 0.6 MG PO TABS: ORAL_TABLET | ORAL | 0 refills | Status: DC

## 2018-01-17 MED ORDER — COLCHICINE 0.6 MG PO TABS
1.2000 mg | ORAL_TABLET | Freq: Once | ORAL | Status: AC
  Administered 2018-01-17: 1.2 mg via ORAL
  Filled 2018-01-17: qty 2

## 2018-01-17 NOTE — ED Notes (Signed)
ED Provider at bedside. 

## 2018-01-17 NOTE — ED Triage Notes (Signed)
Pt presents to ED for assessment of left knee pain after an angiogram on Tuesday this week.  Patient c/o constant pain, increasing with weight bearing.  Denies known injury since.

## 2018-01-17 NOTE — Discharge Instructions (Signed)
Please take colchicine, once daily, starting tomorrow, until 48 hours after symptoms resolve OR until you have reached 7 days. Do not take longer than 7 days.  Follow up with your primary care provider regarding your visit today. Return to the ER for fever, or new or concerning symptoms.

## 2018-01-17 NOTE — ED Provider Notes (Signed)
[facility] EMERGENCY DEPARTMENT Provider Note   CSN: [id number] Arrival date & time: 01/17/18  1244     History   Chief Complaint Chief Complaint  Patient presents with  . Knee Pain    HPI Jesus Lam is a 84 y.o. male with past medical history of gout, hypertension, osteoarthritis, claudication, presenting to the ED with acute onset of left knee pain began on Thursday.  Patient states knee is painful with weightbearing and movement.  Associated swelling.  Has not taken any medications at home for pain, other than his prescribed gabapentin.  Does note history of gout, and states the pain feels similar to either gout or arthritis however he is unsure.  He has not had gout in his knees in the past, only in his toes.  Denies fever or chills, pain in his calf or thigh, or other complaints.  Patient reports recent procedure, on Tuesday he had a left heart cath and lower extremity angiogram.  He was placed on Plavix following that surgery and has been taking as prescribed.  The history is provided by the patient.    Past Medical History:  Diagnosis Date  . BPH (benign prostatic hyperplasia)   . Cataracts, bilateral   . Dyslipidemia   . Gout   . HTN (hypertension)   . Osteoarthritis   . Stasis dermatitis   . Vitamin D deficiency     Patient Active Problem List   Diagnosis Date Noted  . Claudication (HCC) 01/11/2018  . Anemia 10/10/2017  . Cellulitis of leg, left 11/04/2013  . HTN (hypertension) 11/04/2013    Past Surgical History:  Procedure Laterality Date  . ABDOMINAL AORTOGRAM N/A 01/13/2018   Procedure: ABDOMINAL AORTOGRAM;  Surgeon: Elder Negus, MD;  Location: [facility] INV[facility] LAB;  Service: Cardiovascular;  Laterality: N/A;  . CATARACT EXTRACTION, BILATERAL    . LEFT HEART CATH AND CORONARY ANGIOGRAPHY N/A 01/13/2018   Procedure: LEFT HEART CATH AND CORONARY ANGIOGRAPHY;  Surgeon: Elder Negus, MD;  Location: [facility] INVAS[facility] CV LAB;   Service: Cardiovascular;  Laterality: N/A;  . LOWER EXTREMITY ANGIOGRAPHY N/A 01/13/2018   Procedure: LOWER EXTREMITY ANGIOGRAPHY;  Surgeon: Elder Negus, MD;  Location: [facility] INVASIVE CV LAB;  Service: Cardiovascular;  Laterality: N/A;  . PERIPHERAL VASCULAR ATHERECTOMY Left 01/13/2018   Procedure: PERIPHERAL VASCULAR ATHERECTOMY;  Surgeon: Elder Negus, MD;  Location: [facility] INVAS[facility] CV LAB;  Service: Cardiovascular;  Laterality: Left;  SFA WITH PTA DRUG COATED BALLOON       Home Medications    Prior to Admission medications   Medication Sig Start Date End Date Taking? Authorizing Provider  amLODipine (NORVASC) 5 MG tablet Take 1 tablet (5 mg total) by mouth daily. 01/13/18  Yes Patwardhan, Anabel Bene, MD  aspirin EC 81 MG tablet Take 81 mg by mouth daily.   Yes [provider]  atorvastatin (LIPITOR) 80 MG tablet Take 80 mg by mouth daily.   Yes [provider]  Cholecalciferol (VITAMIN D3) 5000 units CAPS Take 5,000 Units by mouth daily. 01/09/18  Yes [provider]  clopidogrel (PLAVIX) 75 MG tablet Take 1 tablet (75 mg total) by mouth daily with breakfast. 01/14/18  Yes Patwardhan, Manish J, MD  furosemide (LASIX) 20 MG tablet Take 20 mg by mouth daily. 01/07/18  Yes [provider]  gabapentin (NEURONTIN) 300 MG capsule Take 300 mg by mouth at bedtime.   Yes [provider]  metoprolol succinate (TOPROL-XL) 25 MG 24 hr tablet Take  25 mg by mouth daily. 01/07/18  Yes [provider]  Multiple Vitamin (MULTIVITAMIN) capsule Take 1 capsule daily by mouth.   Yes [provider]  tamsulosin (FLOMAX) 0.4 MG CAPS capsule Take 1 capsule (0.4 mg total) by mouth daily after supper. 06/27/17  Yes Coralyn Mark, NP  vitamin C (ASCORBIC ACID) 500 MG tablet Take 500 mg daily by mouth.   Yes [provider]  colchicine 0.6 MG tablet Take 1 tab daily until 48 hours after resolution of symptoms, or up to 7 days. 01/17/18    [name], [place] N, PA-C    Family History Family History  Problem Relation Age of Onset  . Cancer Mother   . Cancer Father   . Alcohol abuse Father   . Breast cancer Daughter   . CVA Daughter     Social History Social History   Tobacco Use  . Smoking status: Never Smoker  . Smokeless tobacco: Never Used  Substance Use Topics  . Alcohol use: No    Frequency: Never    Comment: occasionally  . Drug use: Yes    Types: Marijuana    Comment: 1 oz. per week     Allergies   Shellfish allergy   Review of Systems Review of Systems  Constitutional: Negative for chills and fever.  Musculoskeletal: Positive for arthralgias and joint swelling.  All other systems reviewed and are negative.    Physical Exam Updated Vital Signs BP 140/68   Pulse (!) 58   Temp 98.5 F (36.9 C) (Oral)   Resp 18   SpO2 97%   Physical Exam  Constitutional: He appears well-developed and well-nourished. No distress.  HENT:  Head: Normocephalic and atraumatic.  Eyes: Conjunctivae are normal.  Cardiovascular: Normal rate and intact distal pulses.  Pulmonary/Chest: Effort normal.  Abdominal: Soft.  Musculoskeletal:  Left knee with edema, erythema and warmth.  TTP diffusely over anterior aspect, with limited range of motion secondary to pain.  No tenderness over posterior aspect, calf, or thigh. NV intact.  Neurological: He is alert.  Skin: Skin is warm.  Psychiatric: He has a normal mood and affect. His behavior is normal.  Nursing note and vitals reviewed.    ED Treatments / Results  Labs (all labs ordered are listed, but only abnormal results are displayed) Labs Reviewed  SYNOVIAL CELL COUNT + DIFF, W/ CRYSTALS - Abnormal; Notable for the following components:      Result Value   Appearance-Synovial TURBID (*)    WBC, Synovial 12,000 (*)    Neutrophil, Synovial 94 (*)    Monocyte-Macrophage-Synovial Fluid 4 (*)    All other components within normal limits  BASIC METABOLIC PANEL  - Abnormal; Notable for the following components:   GFR calc non Af Amer 57 (*)    All other components within normal limits  CBC WITH DIFFERENTIAL/PLATELET - Abnormal; Notable for the following components:   RBC 3.52 (*)    Hemoglobin 10.3 (*)    HCT 32.8 (*)    All other components within normal limits  GRAM STAIN  CULTURE, BODY FLUID-BOTTLE  GLUCOSE, BODY FLUID OTHER  CBG MONITORING, ED    EKG  EKG Interpretation None       Radiology Dg Knee Complete 4 Views Left  Result Date: 01/17/2018 CLINICAL DATA:  Left knee pain for several days. No reported injury. EXAM: LEFT KNEE - COMPLETE 4+ VIEW COMPARISON:  None. FINDINGS: Small suprapatellar left knee joint effusion. No fracture or dislocation. No suspicious focal  osseous lesion. Small left superior patellar enthesophyte. Mild-to-moderate tricompartmental left knee osteoarthritis, most prominent in the medial compartment. No radiopaque foreign body. IMPRESSION: Small suprapatellar left knee joint effusion. No fracture or dislocation. Mild-to-moderate tricompartmental left knee osteoarthritis. Electronically Signed   By: Delbert Phenix M.D.   On: 01/17/2018 14:04    Procedures .Joint Aspiration/Arthrocentesis Date/Time: 01/17/2018 6:06 PM Performed by: Donalda Job, [place] N, PA-C Authorized by: Ki Corbo, [place] N, PA-C   Consent:    Consent obtained:  Verbal   Consent given by:  Patient   Risks discussed:  Bleeding, pain and infection   Alternatives discussed:  No treatment and observation Location:    Location:  Knee   Knee:  L knee Anesthesia (see MAR for exact dosages):    Anesthesia method:  Local infiltration   Local anesthetic:  Lidocaine 2% WITH epi Procedure details:    Preparation: Patient was prepped and draped in usual sterile fashion     Needle gauge:  18 G   Ultrasound guidance: no     Approach:  Superior   Aspirate amount:  70cc   Aspirate characteristics:  Cloudy and yellow   Steroid injected: no       Specimen collected: yes   Post-procedure details:    Dressing:  Adhesive bandage   Patient tolerance of procedure:  Tolerated well, no immediate complications Comments:     Hemostasis achieved with direct pressure.   (including critical care time)  Medications Ordered in ED Medications  morphine 4 MG/ML injection 4 mg (4 mg Intramuscular Given 01/17/18 1717)  lidocaine-EPINEPHrine (XYLOCAINE W/EPI) 2 %-1:200000 (PF) injection 10 mL (10 mLs Intradermal Given 01/17/18 1725)  oxyCODONE-acetaminophen (PERCOCET/ROXICET) 5-325 MG per tablet 1 tablet (1 tablet Oral Given 01/17/18 1937)  colchicine tablet 1.2 mg (1.2 mg Oral Given 01/17/18 2116)     Initial Impression / Assessment and Plan / ED Course  I have reviewed the triage vital signs and the nursing notes.  Pertinent labs & imaging results that were available during my care of the patient were reviewed by me and considered in my medical decision making (see chart for details).     Pt presents with 3 days of left knee pain and swelling.  Pt is afebrile and stable. Imaging reviewed, no evidence of occult fracture or injury.  Joint aspiration performed in the ED to rule out septic arthritis.  On synovial fluid analysis, white blood cell count <20k and monosodium urate crystals present; not consistent with septic arthritis.  Pt without known peptic ulcer disease and not receiving concurrent treatment on warfarin. Pt dc with colchicine.  Discussed that pt should respond to treatment with in 24 hour of begining treatment & likely resolve in 2-3 days. PCP follow up recommended, strict return precautions discussed.  Patient discussed with and seen by Dr. Jacqulyn Bath.  Discussed results, findings, treatment and follow up. Patient advised of return precautions. Patient verbalized understanding and agreed with plan.  Final Clinical Impressions(s) / ED Diagnoses   Final diagnoses:  Acute idiopathic gout of left knee    ED Discharge Orders         Ordered    colchicine 0.6 MG tablet     01/17/18 2128       Mika Griffitts, [place] N, PA-C 01/17/18 2137    Maia Plan, MD 01/18/18 [phone]

## 2018-01-21 LAB — GLUCOSE, BODY FLUID OTHER: Glucose, Body Fluid Other: 71 mg/dL

## 2018-01-22 LAB — CULTURE, BODY FLUID-BOTTLE: CULTURE: NO GROWTH

## 2018-01-22 LAB — CULTURE, BODY FLUID W GRAM STAIN -BOTTLE

## 2018-01-27 ENCOUNTER — Encounter (HOSPITAL_COMMUNITY)
Admission: RE | Disposition: A | Discharge status: Home / Self Care | Payer: Self-pay | Source: Ambulatory Visit | Attending: Cardiology

## 2018-01-27 ENCOUNTER — Ambulatory Visit (HOSPITAL_COMMUNITY)
Admission: RE | Admit: 2018-01-27 | Discharge: 2018-01-27 | Disposition: A | Discharge status: Home / Self Care | Payer: Medicare HMO | Source: Ambulatory Visit | Attending: Cardiology | Admitting: Cardiology

## 2018-01-27 DIAGNOSIS — I70211 Atherosclerosis of native arteries of extremities with intermittent claudication, right leg: Secondary | ICD-10-CM | POA: Diagnosis not present

## 2018-01-27 DIAGNOSIS — I739 Peripheral vascular disease, unspecified: Secondary | ICD-10-CM | POA: Diagnosis present

## 2018-01-27 DIAGNOSIS — N400 Enlarged prostate without lower urinary tract symptoms: Secondary | ICD-10-CM | POA: Diagnosis not present

## 2018-01-27 DIAGNOSIS — M109 Gout, unspecified: Secondary | ICD-10-CM | POA: Diagnosis not present

## 2018-01-27 DIAGNOSIS — M1990 Unspecified osteoarthritis, unspecified site: Secondary | ICD-10-CM | POA: Diagnosis not present

## 2018-01-27 DIAGNOSIS — E559 Vitamin D deficiency, unspecified: Secondary | ICD-10-CM | POA: Insufficient documentation

## 2018-01-27 DIAGNOSIS — I10 Essential (primary) hypertension: Secondary | ICD-10-CM | POA: Insufficient documentation

## 2018-01-27 DIAGNOSIS — I7092 Chronic total occlusion of artery of the extremities: Secondary | ICD-10-CM | POA: Insufficient documentation

## 2018-01-27 DIAGNOSIS — I872 Venous insufficiency (chronic) (peripheral): Secondary | ICD-10-CM | POA: Diagnosis not present

## 2018-01-27 DIAGNOSIS — E785 Hyperlipidemia, unspecified: Secondary | ICD-10-CM | POA: Insufficient documentation

## 2018-01-27 DIAGNOSIS — Z7982 Long term (current) use of aspirin: Secondary | ICD-10-CM | POA: Diagnosis not present

## 2018-01-27 DIAGNOSIS — Z7902 Long term (current) use of antithrombotics/antiplatelets: Secondary | ICD-10-CM | POA: Insufficient documentation

## 2018-01-27 HISTORY — PX: LOWER EXTREMITY ANGIOGRAPHY: CATH118251

## 2018-01-27 HISTORY — PX: PERIPHERAL VASCULAR INTERVENTION: CATH118257

## 2018-01-27 LAB — POCT ACTIVATED CLOTTING TIME
ACTIVATED CLOTTING TIME: 230 s
ACTIVATED CLOTTING TIME: 252 s
ACTIVATED CLOTTING TIME: 301 s
Activated Clotting Time: 235 seconds

## 2018-01-27 LAB — PROTIME-INR
INR: 1.07
Prothrombin Time: 13.8 seconds (ref 11.4–15.2)

## 2018-01-27 SURGERY — LOWER EXTREMITY ANGIOGRAPHY
Anesthesia: LOCAL | Laterality: Right

## 2018-01-27 MED ORDER — SODIUM CHLORIDE 0.9% FLUSH: 3.0000 mL | INTRAVENOUS | Status: DC | PRN

## 2018-01-27 MED ORDER — NITROGLYCERIN 1 MG/10 ML FOR IR/CATH LAB
INTRA_ARTERIAL | Status: AC
  Filled 2018-01-27: qty 10

## 2018-01-27 MED ORDER — FENTANYL CITRATE (PF) 100 MCG/2ML IJ SOLN
INTRAMUSCULAR | Status: AC
  Filled 2018-01-27: qty 2

## 2018-01-27 MED ORDER — HEPARIN SODIUM (PORCINE) 1000 UNIT/ML IJ SOLN
INTRAMUSCULAR | Status: DC | PRN
  Administered 2018-01-27: 2000 [IU] via INTRAVENOUS
  Administered 2018-01-27 (×2): 3000 [IU] via INTRAVENOUS
  Administered 2018-01-27: 4000 [IU] via INTRAVENOUS
  Administered 2018-01-27: 8000 [IU] via INTRAVENOUS
  Administered 2018-01-27: 3000 [IU] via INTRAVENOUS

## 2018-01-27 MED ORDER — SODIUM CHLORIDE 0.9 % IV SOLN
INTRAVENOUS | Status: DC
  Administered 2018-01-27: 10:00:00 via INTRAVENOUS

## 2018-01-27 MED ORDER — SODIUM CHLORIDE 0.9 % IV BOLUS (SEPSIS)
500.0000 mL | Freq: Once | INTRAVENOUS | Status: AC
  Administered 2018-01-27: 500 mL via INTRAVENOUS

## 2018-01-27 MED ORDER — HEPARIN SODIUM (PORCINE) 1000 UNIT/ML IJ SOLN
INTRAMUSCULAR | Status: AC
  Filled 2018-01-27: qty 1

## 2018-01-27 MED ORDER — MIDAZOLAM HCL 2 MG/2ML IJ SOLN
INTRAMUSCULAR | Status: AC
  Filled 2018-01-27: qty 2

## 2018-01-27 MED ORDER — SODIUM CHLORIDE 0.9 % IV SOLN: 250.0000 mL | INTRAVENOUS | Status: DC | PRN

## 2018-01-27 MED ORDER — HEPARIN (PORCINE) IN NACL 2-0.9 UNIT/ML-% IJ SOLN
INTRAMUSCULAR | Status: AC | PRN
  Administered 2018-01-27: 1000 mL

## 2018-01-27 MED ORDER — SODIUM CHLORIDE 0.9% FLUSH: 3.0000 mL | Freq: Two times a day (BID) | INTRAVENOUS | Status: DC

## 2018-01-27 MED ORDER — LIDOCAINE HCL (PF) 1 % IJ SOLN
INTRAMUSCULAR | Status: DC | PRN
  Administered 2018-01-27: 15 mL
  Administered 2018-01-27: 5 mL

## 2018-01-27 MED ORDER — HYDRALAZINE HCL 20 MG/ML IJ SOLN: 5.0000 mg | INTRAMUSCULAR | Status: DC | PRN

## 2018-01-27 MED ORDER — MIDAZOLAM HCL 2 MG/2ML IJ SOLN
INTRAMUSCULAR | Status: DC | PRN
  Administered 2018-01-27 (×5): 1 mg via INTRAVENOUS

## 2018-01-27 MED ORDER — IODIXANOL 320 MG/ML IV SOLN
INTRAVENOUS | Status: DC | PRN
  Administered 2018-01-27: 90 mL via INTRA_ARTERIAL

## 2018-01-27 MED ORDER — NITROGLYCERIN 1 MG/10 ML FOR IR/CATH LAB
INTRA_ARTERIAL | Status: DC | PRN
  Administered 2018-01-27: 500 ug via INTRA_ARTERIAL
  Administered 2018-01-27: 400 ug via INTRA_ARTERIAL
  Administered 2018-01-27: 500 ug via INTRA_ARTERIAL

## 2018-01-27 MED ORDER — HEPARIN (PORCINE) IN NACL 2-0.9 UNIT/ML-% IJ SOLN
INTRAMUSCULAR | Status: AC
  Filled 2018-01-27: qty 1000

## 2018-01-27 MED ORDER — FENTANYL CITRATE (PF) 100 MCG/2ML IJ SOLN
INTRAMUSCULAR | Status: DC | PRN
  Administered 2018-01-27 (×2): 25 ug via INTRAVENOUS
  Administered 2018-01-27 (×4): 50 ug via INTRAVENOUS

## 2018-01-27 MED ORDER — LIDOCAINE HCL 1 % IJ SOLN
INTRAMUSCULAR | Status: AC
  Filled 2018-01-27: qty 20

## 2018-01-27 MED ORDER — SODIUM CHLORIDE 0.9 % WEIGHT BASED INFUSION
1.0000 mL/kg/h | INTRAVENOUS | Status: DC
  Administered 2018-01-27: 1 mL/kg/h via INTRAVENOUS

## 2018-01-27 SURGICAL SUPPLY — 34 items
BALLN MUSTANG 5X150X135 (BALLOONS) ×3
BALLOON MUSTANG 5X150X135 (BALLOONS) ×2 IMPLANT
CATH CXI 2.3F 90 ST (CATHETERS) ×3 IMPLANT
CATH CXI 4F 150 ST (CATHETERS) ×3 IMPLANT
CATH CXI SUPP ANG 2.6FR 150CM (MICROCATHETER) ×3 IMPLANT
CATH NAVICROSS ST .035X90CM (MICROCATHETER) ×3 IMPLANT
CATH TEMPO 5F RIM 65CM (CATHETERS) ×3 IMPLANT
CATH VIANCE CROSS STAND 150CM (MICROCATHETER) ×3
CATH VIANCE CROSS STD 150CM (MICROCATHETER) ×2 IMPLANT
COVER PRB 48X5XTLSCP FOLD TPE (BAG) ×2 IMPLANT
COVER PROBE 5X48 (BAG) ×1
DEVICE CLOSURE MYNXGRIP 6/7F (Vascular Products) ×3 IMPLANT
DEVICE ONE SNARE 10MM (MISCELLANEOUS) ×3 IMPLANT
GLIDEWIRE ADV .035X260CM (WIRE) ×3 IMPLANT
GUIDEWIRE ASTATO XS 20G 300CM (WIRE) ×3 IMPLANT
HEMOSTASIS PAD V PLUS (HEMOSTASIS) ×3 IMPLANT
KIT ENCORE 26 ADVANTAGE (KITS) ×6 IMPLANT
KIT ESSENTIALS PG (KITS) ×3 IMPLANT
KIT MICROPUNCTURE NIT STIFF (SHEATH) ×3 IMPLANT
KIT PV (KITS) ×3 IMPLANT
SHEATH HIGHFLEX ANSEL 7FR 55CM (SHEATH) ×3 IMPLANT
SHEATH MICROPUNCTURE PEDAL 5FR (SHEATH) ×3 IMPLANT
SHEATH PINNACLE 5F 10CM (SHEATH) ×3 IMPLANT
SHEATH PINNACLE 7F 10CM (SHEATH) ×3 IMPLANT
SHIELD RADPAD SCOOP 12X17 (MISCELLANEOUS) ×3 IMPLANT
STENT ZILVER PTX 6X120 (Permanent Stent) ×6 IMPLANT
TAPE VIPERTRACK RADIOPAQ (MISCELLANEOUS) ×2 IMPLANT
TAPE VIPERTRACK RADIOPAQUE (MISCELLANEOUS) ×1
TRANSDUCER W/STOPCOCK (MISCELLANEOUS) ×3 IMPLANT
TRAY PV CATH (CUSTOM PROCEDURE TRAY) ×3 IMPLANT
TUBING CIL FLEX 10 FLL-RA (TUBING) ×3 IMPLANT
WIRE HI TORQ VERSACORE J 260CM (WIRE) ×3 IMPLANT
WIRE HITORQ VERSACORE ST 145CM (WIRE) ×3 IMPLANT
WIRE TREASURE-12 .018X300CM (WIRE) ×3 IMPLANT

## 2018-01-27 NOTE — Discharge Instructions (Signed)

## 2018-01-27 NOTE — Progress Notes (Signed)
Patient continue to have claudication both calves and activity is limited to Stage 3 Rutherford. He is aware of the risk and benefits of proceeding with PV arteriogram and possible angioplasty. May need retrograde access through PT.  No change in physical exam. Please see previous HPI.  Blood pressure (!) 160/67, pulse 61, temperature 98.4 F (36.9 C), temperature source Oral, height 6' (1.829 m), weight 108.9 kg (240 lb), SpO2 98 %.   Yates Decamp, MD

## 2018-01-28 ENCOUNTER — Encounter (HOSPITAL_COMMUNITY): Payer: Self-pay | Admitting: Cardiology

## 2018-01-29 NOTE — H&P (Signed)
Jesus Lam is an 84 y.o. male.   Chief Complaint: Claudication HPI: Jesus Lam  is a 84 y.o. male  With PAD and now scheduled for elective right lower extremity angiogram and possible angioplasty. Please see prior HPI as this is a scheduled procedure  Past Medical History:  Diagnosis Date  . BPH (benign prostatic hyperplasia)   . Cataracts, bilateral   . Dyslipidemia   . Gout   . HTN (hypertension)   . Osteoarthritis   . Stasis dermatitis   . Vitamin D deficiency     Past Surgical History:  Procedure Laterality Date  . ABDOMINAL AORTOGRAM N/A 01/13/2018   Procedure: ABDOMINAL AORTOGRAM;  Surgeon: Elder Negus, MD;  Location: [facility];  Service: Cardiovascular;  Laterality: N/A;  . CATARACT EXTRACTION, BILATERAL    . LEFT HEART CATH AND CORONARY ANGIOGRAPHY N/A 01/13/2018   Procedure: LEFT HEART CATH AND CORONARY ANGIOGRAPHY;  Surgeon: Elder Negus, MD;  Location: [facility];  Service: Cardiovascular;  Laterality: N/A;  . LOWER EXTREMITY ANGIOGRAPHY N/A 01/13/2018   Procedure: LOWER EXTREMITY ANGIOGRAPHY;  Surgeon: Elder Negus, MD;  Location: [facility];  Service: Cardiovascular;  Laterality: N/A;  . LOWER EXTREMITY ANGIOGRAPHY Right 01/27/2018   Procedure: LOWER EXTREMITY ANGIOGRAPHY;  Surgeon: Yates Decamp, MD;  Location: [facility];  Service: Cardiovascular;  Laterality: Right;  . PERIPHERAL VASCULAR ATHERECTOMY Left 01/13/2018   Procedure: PERIPHERAL VASCULAR ATHERECTOMY;  Surgeon: Elder Negus, MD;  [facility]: [facility];  Service: Cardiovascular;  Laterality: Left;  SFA WITH PTA DRUG COATED BALLOON  . PERIPHERAL VASCULAR INTERVENTION  01/27/2018   Procedure: PERIPHERAL VASCULAR INTERVENTION;  Surgeon: Yates Decamp, MD;  Location: [facility];  Service: Cardiovascular;;    Family History  Problem Relation Age of Onset  . Cancer Mother   . Cancer Father   . Alcohol abuse Father   . Breast cancer  Daughter   . CVA Daughter    Social History:  reports that  has never smoked. he has never used smokeless tobacco. He reports that he uses drugs. Drug: Marijuana. He reports that he does not drink alcohol.  Allergies:  Allergies  Allergen Reactions  . Shellfish Allergy Other (See Comments)    Gout     Review of Systems - Negative except claudication    Blood pressure (!) 156/72, pulse (!) 50, temperature 98.4 F (36.9 C), temperature source Oral, resp. rate 14, height 6' (1.829 m), weight 108.9 kg (240 lb), SpO2 100 %. Body mass index is 32.55 kg/m.  General appearance: alert, cooperative, appears stated age and no distress Eyes: negative findings: lids and lashes normal Neck: no adenopathy, no JVD, supple, symmetrical, trachea midline and thyroid not enlarged, symmetric, no tenderness/mass/nodules Neck: JVP - normal, carotids 2+= without bruits Resp: clear to auscultation bilaterally Chest wall: no tenderness Cardio: regular rate and rhythm, S1, S2 normal, no murmur, click, rub or gallop GI: soft, non-tender; bowel sounds normal; no masses,  no organomegaly Extremities: edema trace Pulses: Absent pedal pulse. Bilateral soft carotid bruit Skin: Skin color, texture, turgor normal. No rashes or lesions Neurologic: Grossly normal  Results for orders placed or performed during the hospital encounter of 01/27/18 (from the past 48 hour(s))  POCT Activated clotting time     Status: None   Collection Time: 01/27/18  8:55 AM  Result Value Ref Range   Activated Clotting Time 235 seconds  POCT Activated clotting time  Status: None   Collection Time: 01/27/18  9:32 AM  Result Value Ref Range   Activated Clotting Time 252 seconds  POCT Activated clotting time     Status: None   Collection Time: 01/27/18 10:24 AM  Result Value Ref Range   Activated Clotting Time 301 seconds    Labs:   Lab Results  Component Value Date   WBC 7.9 01/17/2018   HGB 10.3 (L) 01/17/2018   HCT  32.8 (L) 01/17/2018   MCV 93.2 01/17/2018   PLT 191 01/17/2018   No results for input(s): NA, K, CL, CO2, BUN, CREATININE, CALCIUM, PROT, BILITOT, ALKPHOS, ALT, AST, GLUCOSE in the last 168 hours.  Invalid input(s): LABALBU  Lipid Panel  No results found for: CHOL, TRIG, HDL, CHOLHDL, VLDL, LDLCALC  BNP (last 3 results) No results for input(s): BNP in the last 8760 hours.  HEMOGLOBIN A1C No results found for: HGBA1C, MPG  Cardiac Panel (last 3 results) No results for input(s): CKTOTAL, CKMB, TROPONINI, RELINDX in the last 8760 hours.  No results found for: CKTOTAL, CKMB, CKMBINDEX, TROPONINI   TSH No results for input(s): TSH in the last 8760 hours.   No medications prior to admission.     No current facility-administered medications for this encounter.   Current Outpatient Medications:  .  amLODipine (NORVASC) 5 MG tablet, Take 1 tablet (5 mg total) by mouth daily., Disp: 30 tablet, Rfl: 1 .  aspirin EC 81 MG tablet, Take 81 mg by mouth daily., Disp: , Rfl:  .  atorvastatin (LIPITOR) 80 MG tablet, Take 80 mg by mouth daily., Disp: , Rfl:  .  Cholecalciferol (VITAMIN D3) 5000 units CAPS, Take 5,000 Units by mouth daily., Disp: , Rfl:  .  clopidogrel (PLAVIX) 75 MG tablet, Take 1 tablet (75 mg total) by mouth daily with breakfast., Disp: 60 tablet, Rfl: 3 .  colchicine 0.6 MG tablet, Take 1 tab daily until 48 hours after resolution of symptoms, or up to 7 days., Disp: 7 tablet, Rfl: 0 .  furosemide (LASIX) 20 MG tablet, Take 20 mg by mouth daily., Disp: , Rfl: 0 .  gabapentin (NEURONTIN) 300 MG capsule, Take 300 mg by mouth at bedtime., Disp: , Rfl:  .  metoprolol succinate (TOPROL-XL) 25 MG 24 hr tablet, Take 25 mg by mouth daily., Disp: , Rfl: 0 .  Multiple Vitamin (MULTIVITAMIN) capsule, Take 1 capsule daily by mouth., Disp: , Rfl:  .  tamsulosin (FLOMAX) 0.4 MG CAPS capsule, Take 1 capsule (0.4 mg total) by mouth daily after supper., Disp: 30 capsule, Rfl: 1 .  vitamin C  (ASCORBIC ACID) 500 MG tablet, Take 500 mg daily by mouth., Disp: , Rfl:    Assessment/Plan PAD with claudication scheduled for PV angiogram. All questions answered. Hass Grade 3 Rutherford claudication and on appropriate medical therapy  Yates Decamp, MD 01/29/2018, 8:41 AM [facility] [place] Pager: [phone] Office: [phone] If no answer: Cell:  [phone]

## 2018-01-31 NOTE — Progress Notes (Signed)
I am aware of radiation exposure to this patient. He has scheduled appointment with [place] on 02/04/2018 and will examine for any radiation inurry.  Jesus Lam

## 2018-02-10 NOTE — Progress Notes (Signed)
Patient was seen in our office on 02/05/2018, He has not had any skin burns, has no effects from radiation exposure during peripheral angiogram and angioplasty that was performed on 01/13/2017 and confirmed with Dr. Rosemary Holms. There is been no complications from the procedure as well.

## 2018-04-10 ENCOUNTER — Inpatient Hospital Stay (HOSPITAL_BASED_OUTPATIENT_CLINIC_OR_DEPARTMENT_OTHER): Payer: Medicare HMO | Admitting: Oncology

## 2018-04-10 ENCOUNTER — Telehealth: Payer: Self-pay | Admitting: Oncology

## 2018-04-10 ENCOUNTER — Inpatient Hospital Stay: Payer: Medicare HMO | Attending: Oncology

## 2018-04-10 VITALS — BP 153/70 | HR 61 | Temp 98.6°F | Resp 18 | Ht 72.0 in | Wt 227.0 lb

## 2018-04-10 DIAGNOSIS — R0609 Other forms of dyspnea: Secondary | ICD-10-CM | POA: Insufficient documentation

## 2018-04-10 DIAGNOSIS — Z7982 Long term (current) use of aspirin: Secondary | ICD-10-CM

## 2018-04-10 DIAGNOSIS — D631 Anemia in chronic kidney disease: Secondary | ICD-10-CM | POA: Insufficient documentation

## 2018-04-10 DIAGNOSIS — D649 Anemia, unspecified: Secondary | ICD-10-CM

## 2018-04-10 DIAGNOSIS — Z79899 Other long term (current) drug therapy: Secondary | ICD-10-CM | POA: Insufficient documentation

## 2018-04-10 DIAGNOSIS — N189 Chronic kidney disease, unspecified: Secondary | ICD-10-CM | POA: Diagnosis not present

## 2018-04-10 DIAGNOSIS — N289 Disorder of kidney and ureter, unspecified: Secondary | ICD-10-CM | POA: Diagnosis not present

## 2018-04-10 LAB — CBC WITH DIFFERENTIAL/PLATELET
Basophils Absolute: 0 10*3/uL (ref 0.0–0.1)
Basophils Relative: 0 %
EOS ABS: 0.2 10*3/uL (ref 0.0–0.5)
Eosinophils Relative: 4 %
HEMATOCRIT: 35.4 % — AB (ref 38.4–49.9)
HEMOGLOBIN: 11.1 g/dL — AB (ref 13.0–17.1)
LYMPHS ABS: 1.9 10*3/uL (ref 0.9–3.3)
LYMPHS PCT: 34 %
MCH: 29.4 pg (ref 27.2–33.4)
MCHC: 31.4 g/dL — AB (ref 32.0–36.0)
MCV: 93.9 fL (ref 79.3–98.0)
MONOS PCT: 8 %
Monocytes Absolute: 0.4 10*3/uL (ref 0.1–0.9)
NEUTROS ABS: 3 10*3/uL (ref 1.5–6.5)
NEUTROS PCT: 54 %
Platelets: 151 10*3/uL (ref 140–400)
RBC: 3.77 MIL/uL — AB (ref 4.20–5.82)
RDW: 15.9 % — ABNORMAL HIGH (ref 11.0–14.6)
WBC: 5.5 10*3/uL (ref 4.0–10.3)

## 2018-04-10 NOTE — Telephone Encounter (Signed)
Appointments scheduled avs/calendar printed per 5/17 los °

## 2018-04-10 NOTE — Progress Notes (Signed)
Hematology and Oncology Follow Up Visit  Jesus Lam [id number] [birth year] 84 y.o. 04/10/2018 9:26 AM Lam, Jesus Reaper, PA-CLong, Scott, PA-C   Principle Diagnosis: 84 year old gentleman with multifactorial anemia diagnosed and November 2018.  At that time his hemoglobin was 10.3 with a normal MCV.  His anemia related to chronic disease, renal insufficiency and possible early myelodysplasia.   Current therapy: Active surveillance.  Interim History: Jesus Lam presents today for a follow-up visit.  Since the last visit, he reports no changes in his health.  He denies any dizziness or lightheadedness.  He denies any syncopal episodes.  He continues to workout regularly and exercises about 3 times a week.  He participates in water aerobics as well as cardiovascular exercises.  He does report some mild dyspnea on exertion if he overdoes his exercise.  He denies any hematochezia, melena or hemoptysis.  He denies any decline in his energy her performance status.  He is intentionally trying to lose weight which have helped his exercise tolerance.  He does not report any headaches, blurry vision, syncope or seizures. Does not report any fevers, chills or sweats.  Does not report any cough, wheezing or hemoptysis.  Does not report any chest pain, palpitation, orthopnea or leg edema.  Does not report any nausea, vomiting or abdominal pain.  Does not report any constipation or diarrhea.  Does not report any skeletal complaints.    Does not report frequency, urgency or hematuria.  Does not report any skin rashes or lesions.  Does not report any lymphadenopathy or petechiae. Remaining review of systems is negative.    Medications: I have reviewed the patient's current medications.  Current Outpatient Medications  Medication Sig Dispense Refill  . amLODipine (NORVASC) 5 MG tablet Take 1 tablet (5 mg total) by mouth daily. 30 tablet 1  . aspirin EC 81 MG tablet Take 81 mg by mouth daily.    Marland Kitchen atorvastatin  (LIPITOR) 80 MG tablet Take 80 mg by mouth daily.    . Cholecalciferol (VITAMIN D3) 5000 units CAPS Take 5,000 Units by mouth daily.    . clopidogrel (PLAVIX) 75 MG tablet Take 1 tablet (75 mg total) by mouth daily with breakfast. 60 tablet 3  . colchicine 0.6 MG tablet Take 1 tab daily until 48 hours after resolution of symptoms, or up to 7 days. 7 tablet 0  . furosemide (LASIX) 20 MG tablet Take 20 mg by mouth daily.  0  . gabapentin (NEURONTIN) 300 MG capsule Take 300 mg by mouth at bedtime.    . metoprolol succinate (TOPROL-XL) 25 MG 24 hr tablet Take 25 mg by mouth daily.  0  . Multiple Vitamin (MULTIVITAMIN) capsule Take 1 capsule daily by mouth.    . tamsulosin (FLOMAX) 0.4 MG CAPS capsule Take 1 capsule (0.4 mg total) by mouth daily after supper. 30 capsule 1  . vitamin C (ASCORBIC ACID) 500 MG tablet Take 500 mg daily by mouth.     No current facility-administered medications for this visit.      Allergies:  Allergies  Allergen Reactions  . Shellfish Allergy Other (See Comments)    Gout     Past Medical History, Surgical history, Social history, and Family History were reviewed and updated.  Review of Systems:  Remaining ROS negative.  Physical Exam: Blood pressure (!) 153/70, pulse 61, temperature 98.6 F (37 C), temperature source Oral, resp. rate 18, height 6' (1.829 m), weight 227 lb (103 kg), SpO2 99 %. ECOG:  General appearance: alert and  cooperative appeared without distress. Head: Normocephalic, without obvious abnormality Oropharynx: No oral thrush or ulcers. Eyes: No scleral icterus.  Pupils are equal and round reactive to light. Lymph nodes: Cervical, supraclavicular, and axillary nodes normal. Heart:regular rate and rhythm, S1, S2 normal, no murmur, click, rub or gallop Lung:chest clear, no wheezing, rales, normal symmetric air entry Abdomin: soft, non-tender, without masses or organomegaly. Neurological: No motor, sensory deficits.  Intact deep tendon  reflexes. Skin: No rashes or lesions.  No ecchymosis or petechiae. Musculoskeletal: No joint deformity or effusion. Psychiatric: Mood and affect are appropriate.    Lab Results: Lab Results  Component Value Date   WBC 5.5 04/10/2018   HGB 11.1 (L) 04/10/2018   HCT 35.4 (L) 04/10/2018   MCV 93.9 04/10/2018   PLT 151 04/10/2018     Chemistry      Component Value Date/Time   NA 143 01/17/2018 1717   K 4.0 01/17/2018 1717   CL 108 01/17/2018 1717   CO2 24 01/17/2018 1717   BUN 14 01/17/2018 1717   CREATININE 1.15 01/17/2018 1717      Component Value Date/Time   CALCIUM 9.1 01/17/2018 1717       Impression and Plan:  84 year old gentleman with the following issues:  1.  Normocytic, normochromic anemia is chronic in nature.  His hemoglobin today is 11.1 which is identical to his previous hemoglobin dating back to 2014.  His anemia remains mild and stable over at least the last 5 years.   The differential diagnosis was reviewed today with the patient which includes anemia of chronic disease, anemia of renal disease, and possibly early myelodysplasia.  He does have an elevated RDW which is noted for the first time on today's exam.  This might indicate the possibility of iron deficiency which I will check as well.  He denies any GI blood losses but never had a colonoscopy.  From a management standpoint, his hemoglobin remains stable and is asymptomatic.  I recommended continued active surveillance and repeat iron studies.  We will defer the option of a bone marrow biopsy at this time.  2.  Follow-up: We will be in 6 months sooner if needed 2.  15  minutes was spent with the patient face-to-face today.  More than 50% of time was dedicated to patient counseling, education and coordination of his care.   Jesus Button, MD 5/17/20199:26 AM

## 2018-06-12 ENCOUNTER — Other Ambulatory Visit: Payer: Self-pay

## 2018-06-12 ENCOUNTER — Encounter (HOSPITAL_COMMUNITY): Payer: Self-pay | Admitting: Internal Medicine

## 2018-06-12 ENCOUNTER — Emergency Department (HOSPITAL_COMMUNITY): Payer: Medicare HMO

## 2018-06-12 ENCOUNTER — Inpatient Hospital Stay (HOSPITAL_COMMUNITY)
Admission: EM | Admit: 2018-06-12 | Discharge: 2018-06-14 | DRG: 065 | Disposition: A | Discharge status: Home Health | Payer: Medicare HMO | Attending: Internal Medicine | Admitting: Internal Medicine

## 2018-06-12 ENCOUNTER — Inpatient Hospital Stay (HOSPITAL_COMMUNITY): Payer: Medicare HMO

## 2018-06-12 DIAGNOSIS — I63512 Cerebral infarction due to unspecified occlusion or stenosis of left middle cerebral artery: Principal | ICD-10-CM | POA: Diagnosis present

## 2018-06-12 DIAGNOSIS — F1210 Cannabis abuse, uncomplicated: Secondary | ICD-10-CM | POA: Diagnosis present

## 2018-06-12 DIAGNOSIS — N289 Disorder of kidney and ureter, unspecified: Secondary | ICD-10-CM

## 2018-06-12 DIAGNOSIS — I639 Cerebral infarction, unspecified: Secondary | ICD-10-CM

## 2018-06-12 DIAGNOSIS — E785 Hyperlipidemia, unspecified: Secondary | ICD-10-CM | POA: Diagnosis not present

## 2018-06-12 DIAGNOSIS — I159 Secondary hypertension, unspecified: Secondary | ICD-10-CM

## 2018-06-12 DIAGNOSIS — E860 Dehydration: Secondary | ICD-10-CM | POA: Diagnosis present

## 2018-06-12 DIAGNOSIS — R4701 Aphasia: Secondary | ICD-10-CM | POA: Diagnosis not present

## 2018-06-12 DIAGNOSIS — E669 Obesity, unspecified: Secondary | ICD-10-CM | POA: Diagnosis not present

## 2018-06-12 DIAGNOSIS — R471 Dysarthria and anarthria: Secondary | ICD-10-CM | POA: Diagnosis present

## 2018-06-12 DIAGNOSIS — I10 Essential (primary) hypertension: Secondary | ICD-10-CM | POA: Diagnosis present

## 2018-06-12 DIAGNOSIS — I110 Hypertensive heart disease with heart failure: Secondary | ICD-10-CM | POA: Diagnosis present

## 2018-06-12 DIAGNOSIS — N179 Acute kidney failure, unspecified: Secondary | ICD-10-CM | POA: Diagnosis present

## 2018-06-12 DIAGNOSIS — I420 Dilated cardiomyopathy: Secondary | ICD-10-CM | POA: Diagnosis not present

## 2018-06-12 DIAGNOSIS — M109 Gout, unspecified: Secondary | ICD-10-CM | POA: Diagnosis not present

## 2018-06-12 DIAGNOSIS — I444 Left anterior fascicular block: Secondary | ICD-10-CM | POA: Diagnosis present

## 2018-06-12 DIAGNOSIS — Z6830 Body mass index (BMI) 30.0-30.9, adult: Secondary | ICD-10-CM

## 2018-06-12 DIAGNOSIS — Z7982 Long term (current) use of aspirin: Secondary | ICD-10-CM

## 2018-06-12 DIAGNOSIS — H5340 Unspecified visual field defects: Secondary | ICD-10-CM | POA: Diagnosis not present

## 2018-06-12 DIAGNOSIS — D649 Anemia, unspecified: Secondary | ICD-10-CM | POA: Diagnosis present

## 2018-06-12 DIAGNOSIS — G629 Polyneuropathy, unspecified: Secondary | ICD-10-CM | POA: Diagnosis not present

## 2018-06-12 DIAGNOSIS — I872 Venous insufficiency (chronic) (peripheral): Secondary | ICD-10-CM | POA: Diagnosis present

## 2018-06-12 DIAGNOSIS — I5040 Unspecified combined systolic (congestive) and diastolic (congestive) heart failure: Secondary | ICD-10-CM | POA: Diagnosis present

## 2018-06-12 DIAGNOSIS — I739 Peripheral vascular disease, unspecified: Secondary | ICD-10-CM | POA: Diagnosis not present

## 2018-06-12 DIAGNOSIS — I69322 Dysarthria following cerebral infarction: Secondary | ICD-10-CM | POA: Diagnosis present

## 2018-06-12 DIAGNOSIS — M1990 Unspecified osteoarthritis, unspecified site: Secondary | ICD-10-CM | POA: Diagnosis present

## 2018-06-12 DIAGNOSIS — I440 Atrioventricular block, first degree: Secondary | ICD-10-CM | POA: Diagnosis not present

## 2018-06-12 DIAGNOSIS — I2510 Atherosclerotic heart disease of native coronary artery without angina pectoris: Secondary | ICD-10-CM | POA: Diagnosis not present

## 2018-06-12 DIAGNOSIS — Z823 Family history of stroke: Secondary | ICD-10-CM

## 2018-06-12 DIAGNOSIS — Z7902 Long term (current) use of antithrombotics/antiplatelets: Secondary | ICD-10-CM

## 2018-06-12 DIAGNOSIS — R4182 Altered mental status, unspecified: Secondary | ICD-10-CM | POA: Diagnosis present

## 2018-06-12 DIAGNOSIS — Z79899 Other long term (current) drug therapy: Secondary | ICD-10-CM

## 2018-06-12 DIAGNOSIS — I700 Atherosclerosis of aorta: Secondary | ICD-10-CM | POA: Diagnosis present

## 2018-06-12 DIAGNOSIS — N400 Enlarged prostate without lower urinary tract symptoms: Secondary | ICD-10-CM | POA: Diagnosis present

## 2018-06-12 DIAGNOSIS — Z91013 Allergy to seafood: Secondary | ICD-10-CM

## 2018-06-12 HISTORY — DX: Peripheral vascular disease, unspecified: I73.9

## 2018-06-12 LAB — DIFFERENTIAL
BASOS ABS: 0 10*3/uL (ref 0.0–0.1)
BASOS PCT: 0 %
Eosinophils Absolute: 0.1 10*3/uL (ref 0.0–0.7)
Eosinophils Relative: 2 %
LYMPHS ABS: 1.9 10*3/uL (ref 0.7–4.0)
LYMPHS PCT: 29 %
MONO ABS: 0.6 10*3/uL (ref 0.1–1.0)
MONOS PCT: 9 %
Neutro Abs: 3.9 10*3/uL (ref 1.7–7.7)
Neutrophils Relative %: 60 %

## 2018-06-12 LAB — COMPREHENSIVE METABOLIC PANEL
ALK PHOS: 76 U/L (ref 38–126)
ALT: 21 U/L (ref 0–44)
AST: 25 U/L (ref 15–41)
Albumin: 3.6 g/dL (ref 3.5–5.0)
Anion gap: 8 (ref 5–15)
BUN: 20 mg/dL (ref 8–23)
CALCIUM: 9.5 mg/dL (ref 8.9–10.3)
CO2: 26 mmol/L (ref 22–32)
CREATININE: 1.3 mg/dL — AB (ref 0.61–1.24)
Chloride: 108 mmol/L (ref 98–111)
GFR, EST AFRICAN AMERICAN: 57 mL/min — AB (ref >60)
GFR, EST NON AFRICAN AMERICAN: 49 mL/min — AB (ref >60)
Glucose, Bld: 105 mg/dL — ABNORMAL HIGH (ref 70–99)
Potassium: 4 mmol/L (ref 3.5–5.1)
Sodium: 142 mmol/L (ref 135–145)
Total Bilirubin: 0.7 mg/dL (ref 0.3–1.2)
Total Protein: 8 g/dL (ref 6.5–8.1)

## 2018-06-12 LAB — URINALYSIS, ROUTINE W REFLEX MICROSCOPIC
BILIRUBIN URINE: NEGATIVE
Glucose, UA: NEGATIVE mg/dL
HGB URINE DIPSTICK: NEGATIVE
Ketones, ur: NEGATIVE mg/dL
Leukocytes, UA: NEGATIVE
NITRITE: NEGATIVE
PROTEIN: NEGATIVE mg/dL
Specific Gravity, Urine: 1.012 (ref 1.005–1.030)
pH: 6 (ref 5.0–8.0)

## 2018-06-12 LAB — I-STAT CHEM 8, ED
BUN: 18 mg/dL (ref 8–23)
CALCIUM ION: 1.19 mmol/L (ref 1.15–1.40)
CHLORIDE: 106 mmol/L (ref 98–111)
CREATININE: 1.4 mg/dL — AB (ref 0.61–1.24)
Glucose, Bld: 102 mg/dL — ABNORMAL HIGH (ref 70–99)
HCT: 35 % — ABNORMAL LOW (ref 39.0–52.0)
Hemoglobin: 11.9 g/dL — ABNORMAL LOW (ref 13.0–17.0)
POTASSIUM: 3.9 mmol/L (ref 3.5–5.1)
Sodium: 141 mmol/L (ref 135–145)
TCO2: 24 mmol/L (ref 22–32)

## 2018-06-12 LAB — CBC
HCT: 36.4 % — ABNORMAL LOW (ref 39.0–52.0)
HEMOGLOBIN: 11.7 g/dL — AB (ref 13.0–17.0)
MCH: 29 pg (ref 26.0–34.0)
MCHC: 32.1 g/dL (ref 30.0–36.0)
MCV: 90.3 fL (ref 78.0–100.0)
Platelets: 177 10*3/uL (ref 150–400)
RBC: 4.03 MIL/uL — AB (ref 4.22–5.81)
RDW: 13.7 % (ref 11.5–15.5)
WBC: 6.6 10*3/uL (ref 4.0–10.5)

## 2018-06-12 LAB — RAPID URINE DRUG SCREEN, HOSP PERFORMED
AMPHETAMINES: NOT DETECTED
BARBITURATES: NOT DETECTED
Benzodiazepines: NOT DETECTED
Cocaine: NOT DETECTED
Opiates: NOT DETECTED
Tetrahydrocannabinol: POSITIVE — AB

## 2018-06-12 LAB — ETHANOL

## 2018-06-12 LAB — I-STAT TROPONIN, ED: TROPONIN I, POC: 0.02 ng/mL (ref 0.00–0.08)

## 2018-06-12 LAB — PROTIME-INR
INR: 1.04
Prothrombin Time: 13.5 seconds (ref 11.4–15.2)

## 2018-06-12 LAB — APTT: APTT: 32 s (ref 24–36)

## 2018-06-12 MED ORDER — STROKE: EARLY STAGES OF RECOVERY BOOK
Freq: Once | Status: AC
  Administered 2018-06-13: 01:00:00
  Filled 2018-06-12: qty 1

## 2018-06-12 MED ORDER — ACETAMINOPHEN 325 MG PO TABS: 650.0000 mg | ORAL_TABLET | ORAL | Status: DC | PRN

## 2018-06-12 MED ORDER — ACETAMINOPHEN 650 MG RE SUPP: 650.0000 mg | RECTAL | Status: DC | PRN

## 2018-06-12 MED ORDER — ASPIRIN 300 MG RE SUPP
300.0000 mg | Freq: Every day | RECTAL | Status: DC
  Filled 2018-06-12: qty 1

## 2018-06-12 MED ORDER — ASPIRIN 325 MG PO TABS
325.0000 mg | ORAL_TABLET | Freq: Every day | ORAL | Status: DC
  Administered 2018-06-13 – 2018-06-14 (×2): 325 mg via ORAL
  Filled 2018-06-12 (×2): qty 1

## 2018-06-12 MED ORDER — ACETAMINOPHEN 160 MG/5ML PO SOLN: 650.0000 mg | ORAL | Status: DC | PRN

## 2018-06-12 NOTE — ED Notes (Signed)
ED TO INPATIENT HANDOFF REPORT  Name/Age/Gender Jesus Lam 84 y.o. male  Code Status    Code Status Orders  (From admission, onward)        Start     Ordered   06/12/18 2239  Full code  Continuous     06/12/18 2241    Code Status History    Date Active Date Inactive Code Status Order ID Comments User Context   01/27/2018 1222 01/27/2018 2136 Full Code [id number]  Adrian Prows, MD Inpatient   01/13/2018 1133 01/13/2018 1707 Full Code [id number]  Nigel Mormon, MD Inpatient   11/04/2013 2055 11/09/2013 1955 Full Code [id number]  Etta Quill, DO Inpatient      Home/SNF/Other Home  Chief Complaint Altered Mental Status  Level of Care/Admitting Diagnosis ED Disposition    ED Disposition Condition [facility] Area: [place] [100100]  Level of Care: Telemetry [5]  Diagnosis: Altered mental status [780.97.ICD-9-CM]  Admitting Physician: Jani Gravel [[id number]]  Attending Physician: Jani Gravel [phone]  Estimated length of stay: past midnight tomorrow  Certification:: I certify this patient will need inpatient services for at least 2 midnights  PT Class (Do Not Modify): Inpatient [101]  PT Acc Code (Do Not Modify): Private [1]       Medical History Past Medical History:  Diagnosis Date  . BPH (benign prostatic hyperplasia)   . Cataracts, bilateral   . Dyslipidemia   . Gout   . HTN (hypertension)   . Osteoarthritis   . PAD (peripheral artery disease) ([place])   . Stasis dermatitis   . Vitamin D deficiency     Allergies Allergies  Allergen Reactions  . Shellfish Allergy Other (See Comments)    Gout     IV Location/Drains/Wounds Patient Lines/Drains/Airways Status   Active Line/Drains/Airways    Name:   Placement date:   Placement time:   Site:   Days:   Peripheral IV 06/12/18 Posterior Forearm   06/12/18    2055    Forearm   less than 1   Sheath 01/27/18 Left Arterial;Femoral   01/27/18    0748    Arterial;Femoral   136   Sheath 01/27/18 Right Arterial   01/27/18    0902    Arterial   136          Labs/Imaging Results for orders placed or performed during the hospital encounter of 06/12/18 (from the past 48 hour(s))  Ethanol     Status: None   Collection Time: 06/12/18  8:49 PM  Result Value Ref Range   Alcohol, Ethyl (B) <10 <10 mg/dL    Comment: (NOTE) Lowest detectable limit for serum alcohol is 10 mg/dL. For medical purposes only. Performed at [facility], [place] [address], [place], [place] [address]   Protime-INR     Status: None   Collection Time: 06/12/18  8:49 PM  Result Value Ref Range   Prothrombin Time 13.5 11.4 - 15.2 seconds   INR 1.04     Comment: Performed at [facility], [place] [address] [place], [name] [address]  APTT     Status: None   Collection Time: 06/12/18  8:49 PM  Result Value Ref Range   aPTT 32 24 - 36 seconds    Comment: Performed at [facility], [place] [address], [place], [place] [address]  CBC     Status: Abnormal   Collection Time: 06/12/18  8:49 PM  Result Value Ref  Range   WBC 6.6 4.0 - 10.5 K/uL   RBC 4.03 (L) 4.22 - 5.81 MIL/uL   Hemoglobin 11.7 (L) 13.0 - 17.0 g/dL   HCT 36.4 (L) 39.0 - 52.0 %   MCV 90.3 78.0 - 100.0 fL   MCH 29.0 26.0 - 34.0 pg   MCHC 32.1 30.0 - 36.0 g/dL   RDW 13.7 11.5 - 15.5 %   Platelets 177 150 - 400 K/uL    Comment: Performed at [facility], [place] [address] [place], [place] [address]  Differential     Status: None   Collection Time: 06/12/18  8:49 PM  Result Value Ref Range   Neutrophils Relative % 60 %   Neutro Abs 3.9 1.7 - 7.7 K/uL   Lymphocytes Relative 29 %   Lymphs Abs 1.9 0.7 - 4.0 K/uL   Monocytes Relative 9 %   Monocytes Absolute 0.6 0.1 - 1.0 K/uL   Eosinophils Relative 2 %   Eosinophils Absolute 0.1 0.0 - 0.7 K/uL   Basophils Relative 0 %   Basophils Absolute 0.0 0.0 - 0.1 K/uL    Comment: Performed at [facility],  [place] [address], [place], [place] [address]  Comprehensive metabolic panel     Status: Abnormal   Collection Time: 06/12/18  8:49 PM  Result Value Ref Range   Sodium 142 135 - 145 mmol/L   Potassium 4.0 3.5 - 5.1 mmol/L   Chloride 108 98 - 111 mmol/L    Comment: Please note change in reference range.   CO2 26 22 - 32 mmol/L   Glucose, Bld 105 (H) 70 - 99 mg/dL    Comment: Please note change in reference range.   BUN 20 8 - 23 mg/dL    Comment: Please note change in reference range.   Creatinine, Ser 1.30 (H) 0.61 - 1.24 mg/dL   Calcium 9.5 8.9 - 10.3 mg/dL   Total Protein 8.0 6.5 - 8.1 g/dL   Albumin 3.6 3.5 - 5.0 g/dL   AST 25 15 - 41 U/L   ALT 21 0 - 44 U/L    Comment: Please note change in reference range.   Alkaline Phosphatase 76 38 - 126 U/L   Total Bilirubin 0.7 0.3 - 1.2 mg/dL   GFR calc non Af Amer 49 (L) >60 mL/min   GFR calc Af Amer 57 (L) >60 mL/min    Comment: (NOTE) The eGFR has been calculated using the CKD EPI equation. This calculation has not been validated in all clinical situations. eGFR's persistently <60 mL/min signify possible Chronic Kidney Disease.    Anion gap 8 5 - 15    Comment: Performed at [facility], [place] [address], [place], [place] [address]  I-stat troponin, ED     Status: None   Collection Time: 06/12/18  8:59 PM  Result Value Ref Range   Troponin i, poc 0.02 0.00 - 0.08 ng/mL   Comment 3            Comment: Due to the release kinetics of cTnI, a negative result within the first hours of the onset of symptoms does not rule out myocardial infarction with certainty. If myocardial infarction is still suspected, repeat the test at appropriate intervals.   I-Stat Chem 8, ED     Status: Abnormal   Collection Time: 06/12/18  9:00 PM  Result Value Ref Range   Sodium 141 135 - 145 mmol/L   Potassium 3.9 3.5 - 5.1 mmol/L  Chloride 106 98 - 111 mmol/L   BUN 18 8 - 23 mg/dL   Creatinine, Ser 1.40 (H) 0.61 - 1.24 mg/dL    Glucose, Bld 102 (H) 70 - 99 mg/dL   Calcium, Ion 1.19 1.15 - 1.40 mmol/L   TCO2 24 22 - 32 mmol/L   Hemoglobin 11.9 (L) 13.0 - 17.0 g/dL   HCT 35.0 (L) 39.0 - 52.0 %  Urine rapid drug screen (hosp performed)     Status: Abnormal   Collection Time: 06/12/18  9:23 PM  Result Value Ref Range   Opiates NONE DETECTED NONE DETECTED   Cocaine NONE DETECTED NONE DETECTED   Benzodiazepines NONE DETECTED NONE DETECTED   Amphetamines NONE DETECTED NONE DETECTED   Tetrahydrocannabinol POSITIVE (A) NONE DETECTED   Barbiturates NONE DETECTED NONE DETECTED    Comment: (NOTE) DRUG SCREEN FOR MEDICAL PURPOSES ONLY.  IF CONFIRMATION IS NEEDED FOR ANY PURPOSE, NOTIFY LAB WITHIN 5 DAYS. LOWEST DETECTABLE LIMITS FOR URINE DRUG SCREEN Drug Class                     Cutoff (ng/mL) Amphetamine and metabolites    1000 Barbiturate and metabolites    200 Benzodiazepine                 532 Tricyclics and metabolites     300 Opiates and metabolites        300 Cocaine and metabolites        300 THC                            50 Performed at [facility], [place] [address], [place], [place] [address]   Urinalysis, Routine w reflex microscopic     Status: None   Collection Time: 06/12/18  9:23 PM  Result Value Ref Range   Color, Urine YELLOW YELLOW   APPearance CLEAR CLEAR   Specific Gravity, Urine 1.012 1.005 - 1.030   pH 6.0 5.0 - 8.0   Glucose, UA NEGATIVE NEGATIVE mg/dL   Hgb urine dipstick NEGATIVE NEGATIVE   Bilirubin Urine NEGATIVE NEGATIVE   Ketones, ur NEGATIVE NEGATIVE mg/dL   Protein, ur NEGATIVE NEGATIVE mg/dL   Nitrite NEGATIVE NEGATIVE   Leukocytes, UA NEGATIVE NEGATIVE    Comment: Performed at [place] [address] [place], [place] [address]   Ct Head Wo Contrast  Result Date: 06/12/2018 CLINICAL DATA:  Confusion.  Altered mental status. EXAM: CT HEAD WITHOUT CONTRAST TECHNIQUE: Contiguous axial images were obtained from the base of the  skull through the vertex without intravenous contrast. COMPARISON:  None. FINDINGS: Brain: The brainstem, cerebellum, cerebral peduncles, thalami, basal ganglia, basilar cisterns, and ventricular system appear within normal limits. No intracranial hemorrhage, mass lesion, or acute CVA. Vascular: Unremarkable Skull: Unremarkable Sinuses/Orbits: Unremarkable Other: No supplemental non-categorized findings. IMPRESSION: 1.  No significant abnormality identified. Electronically Signed   By: Van Clines M.D.   On: 06/12/2018 21:10    Pending Labs Unresulted Labs (From admission, onward)   Start     Ordered   06/13/18 0500  Hemoglobin A1c  Tomorrow morning,   R     06/12/18 2241   06/13/18 0500  Lipid panel  Tomorrow morning,   R    Comments:  Fasting    06/12/18 2241      Vitals/Pain Today's Vitals   06/12/18 2214 06/12/18 2215 06/12/18 2230 06/12/18 2245  BP:  Pulse:  69 66 67  Resp:  18 (!) 22 18  Temp:      TempSrc:      SpO2:  96% 95% 93%  PainSc: 0-No pain       Isolation Precautions No active isolations  Medications Medications   stroke: mapping our early stages of recovery book (has no administration in time range)  acetaminophen (TYLENOL) tablet 650 mg (has no administration in time range)    Or  acetaminophen (TYLENOL) solution 650 mg (has no administration in time range)    Or  acetaminophen (TYLENOL) suppository 650 mg (has no administration in time range)  aspirin suppository 300 mg (has no administration in time range)    Or  aspirin tablet 325 mg (has no administration in time range)    Mobility walks

## 2018-06-12 NOTE — H&P (Addendum)
TRH H&P   Patient Demographics:    Jesus Lam, is a 84 y.o. male  MRN: [id number]   DOB - [birth year]  Admit Date - 06/12/2018  Outpatient Primary MD for the patient is Shanon Rosser, PA-C  Referring MD/NP/PA:  Wyn Quaker  Outpatient Specialists:   Adrian Prows  Patient coming from:   home  Chief Complaint  Patient presents with  . Altered Mental Status      HPI:    Jesus Lam  is a 84 y.o. male, w hypertension, PAD, apparently was brought to ER for altered mental status and dysarthria.  This apparently began around 2pm per patient.  Pt might have difficulty with vision in his right eye.    In the ED, he was seen by teleneurology who recommended MRI brain and r/o CVA   In Ed,   Etoh <10 Wbc 6.6, hgb 11.7, Plt 177 PTT 32, INR 1.0 Na 142, K 4.0, Bun 20, creatinine 1.30 Ast 25, Alt 21 Trop 0.02  UDS + THC Urinalysis negative   CT brain IMPRESSION: 1.  No significant abnormality identified.  Ekg ns at 80, LAD, prolonged PR,  No st-t changes c/w ischemia  Neurology consulted by me, and will see once arrives at [facility].   Pt will be admitted for altered mental status r/o CVA, ddx THC intoxication.         Review of systems:    In addition to the HPI above,   No Fever-chills, No Headache, No changes  hearing, No problems swallowing food or Liquids, No Chest pain, Cough or Shortness of Breath, No Abdominal pain, No Nausea or Vommitting, Bowel movements are regular, No Blood in stool or Urine, No dysuria, No new skin rashes or bruises, No new joints pains-aches,  No new weakness, tingling, numbness in any extremity, No recent weight gain or loss, No polyuria, polydypsia or polyphagia, No significant Mental Stressors.  A full 10 point Review of Systems was done, except as stated above, all other Review of Systems were negative.   With Past  History of the following :    Past Medical History:  Diagnosis Date  . BPH (benign prostatic hyperplasia)   . Cataracts, bilateral   . Dyslipidemia   . Gout   . HTN (hypertension)   . Osteoarthritis   . Stasis dermatitis   . Vitamin D deficiency       Past Surgical History:  Procedure Laterality Date  . ABDOMINAL AORTOGRAM N/A 01/13/2018   Procedure: ABDOMINAL AORTOGRAM;  Surgeon: Nigel Mormon, MD;  Location: [facility];  Service: Cardiovascular;  Laterality: N/A;  . CATARACT EXTRACTION, BILATERAL    . LEFT HEART CATH AND CORONARY ANGIOGRAPHY N/A 01/13/2018   Procedure: LEFT HEART CATH AND CORONARY ANGIOGRAPHY;  Surgeon: Nigel Mormon, MD;  Location: [facility];  Service: Cardiovascular;  Laterality: N/A;  . LOWER  EXTREMITY ANGIOGRAPHY N/A 01/13/2018   Procedure: LOWER EXTREMITY ANGIOGRAPHY;  Surgeon: Nigel Mormon, MD;  Location: [facility];  Service: Cardiovascular;  Laterality: N/A;  . LOWER EXTREMITY ANGIOGRAPHY Right 01/27/2018   Procedure: LOWER EXTREMITY ANGIOGRAPHY;  Surgeon: Adrian Prows, MD;  Location: [facility];  Service: Cardiovascular;  Laterality: Right;  . PERIPHERAL VASCULAR ATHERECTOMY Left 01/13/2018   Procedure: PERIPHERAL VASCULAR ATHERECTOMY;  Surgeon: Nigel Mormon, MD;  Location: [facility];  Service: Cardiovascular;  Laterality: Left;  SFA WITH PTA DRUG COATED BALLOON  . PERIPHERAL VASCULAR INTERVENTION  01/27/2018   Procedure: PERIPHERAL VASCULAR INTERVENTION;  Surgeon: Adrian Prows, MD;  Location: [facility];  Service: Cardiovascular;;      Social History:     Social History   Tobacco Use  . Smoking status: Never Smoker  . Smokeless tobacco: Never Used  Substance Use Topics  . Alcohol use: No    Frequency: Never    Comment: occasionally     Lives - at home  Mobility - walks by self   Family History :     Family History  Problem Relation Age of Onset  . Cancer Mother   .  Cancer Father   . Alcohol abuse Father   . Breast cancer Daughter   . CVA Daughter        Home Medications:   Prior to Admission medications   Medication Sig Start Date End Date Taking? Authorizing Provider  amLODipine (NORVASC) 5 MG tablet Take 1 tablet (5 mg total) by mouth daily. 01/13/18  Yes Patwardhan, Reynold Bowen, MD  aspirin EC 81 MG tablet Take 81 mg by mouth daily.   Yes [provider]  atorvastatin (LIPITOR) 80 MG tablet Take 80 mg by mouth daily.   Yes [provider]  chlorthalidone (HYGROTON) 25 MG tablet Take 12.5 mg by mouth daily. 05/13/18  Yes [provider]  Cholecalciferol (VITAMIN D3) 5000 units CAPS Take 5,000 Units by mouth daily. 01/09/18  Yes [provider]  clopidogrel (PLAVIX) 75 MG tablet Take 1 tablet (75 mg total) by mouth daily with breakfast. 01/14/18  Yes Patwardhan, Manish J, MD  colchicine 0.6 MG tablet Take 1 tab daily until 48 hours after resolution of symptoms, or up to 7 days. 01/17/18  Yes Robinson, [place] N, PA-C  furosemide (LASIX) 20 MG tablet Take 20 mg by mouth daily. 01/07/18  Yes [provider]  gabapentin (NEURONTIN) 300 MG capsule Take 300 mg by mouth at bedtime.   Yes [provider]  metoprolol succinate (TOPROL-XL) 25 MG 24 hr tablet Take 25 mg by mouth daily. 01/07/18  Yes [provider]  Multiple Vitamin (MULTIVITAMIN) capsule Take 1 capsule daily by mouth.   Yes [provider]  tamsulosin (FLOMAX) 0.4 MG CAPS capsule Take 1 capsule (0.4 mg total) by mouth daily after supper. 06/27/17  Yes Marney Setting, NP  vitamin C (ASCORBIC ACID) 500 MG tablet Take 500 mg daily by mouth.   Yes [provider]     Allergies:     Allergies  Allergen Reactions  . Shellfish Allergy Other (See Comments)    Gout      Physical Exam:   Vitals  Blood pressure (!) 155/82, pulse 75, temperature 98.8 F (37.1 C), resp. rate (!) 21, SpO2 96 %.   1. General  lying in  bed in NAD,  2. Normal affect and insight, Not Suicidal or Homicidal, Awake Alert, Oriented X  3.  3. No F.N deficits, ALL C.Nerves Intact, Strength 5/5 all 4 extremities, Sensation intact all 4 extremities, Plantars down going on the left and upgoing on the right  Pt is unable to recognize pen when placed in the right hand.   + dysarthria, expressive and receptive aphasia  4. Ears and Eyes appear Normal, Conjunctivae clear, PERRLA. Moist Oral Mucosa.  5. Supple Neck, No JVD, No cervical lymphadenopathy appriciated, No Carotid Bruits.  6. Symmetrical Chest wall movement, Good air movement bilaterally, CTAB.  7. RRR, No Gallops, Rubs or Murmurs, No Parasternal Heave.  8. Positive Bowel Sounds, Abdomen Soft, No tenderness, No organomegaly appriciated,No rebound -guarding or rigidity.  9.  No Cyanosis, Normal Skin Turgor, No Skin Rash or Bruise.  10. Good muscle tone,  joints appear normal , no effusions, Normal ROM.  11. No Palpable Lymph Nodes in Neck or Axillae      Data Review:    CBC Recent Labs  Lab 06/12/18 2049 06/12/18 2100  WBC 6.6  --   HGB 11.7* 11.9*  HCT 36.4* 35.0*  PLT 177  --   MCV 90.3  --   MCH 29.0  --   MCHC 32.1  --   RDW 13.7  --   LYMPHSABS 1.9  --   MONOABS 0.6  --   EOSABS 0.1  --   BASOSABS 0.0  --    ------------------------------------------------------------------------------------------------------------------  Chemistries  Recent Labs  Lab 06/12/18 2049 06/12/18 2100  NA 142 141  K 4.0 3.9  CL 108 106  CO2 26  --   GLUCOSE 105* 102*  BUN 20 18  CREATININE 1.30* 1.40*  CALCIUM 9.5  --   AST 25  --   ALT 21  --   ALKPHOS 76  --   BILITOT 0.7  --    ------------------------------------------------------------------------------------------------------------------ CrCl cannot be calculated (Unknown ideal  weight.). ------------------------------------------------------------------------------------------------------------------ No results for input(s): TSH, T4TOTAL, T3FREE, THYROIDAB in the last 72 hours.  Invalid input(s): FREET3  Coagulation profile Recent Labs  Lab 06/12/18 2049  INR 1.04   ------------------------------------------------------------------------------------------------------------------- No results for input(s): DDIMER in the last 72 hours. -------------------------------------------------------------------------------------------------------------------  Cardiac Enzymes No results for input(s): CKMB, TROPONINI, MYOGLOBIN in the last 168 hours.  Invalid input(s): CK ------------------------------------------------------------------------------------------------------------------ No results found for: BNP   ---------------------------------------------------------------------------------------------------------------  Urinalysis    Component Value Date/Time   COLORURINE YELLOW 06/12/2018 2123   APPEARANCEUR CLEAR 06/12/2018 2123   LABSPEC 1.012 06/12/2018 2123   PHURINE 6.0 06/12/2018 2123   GLUCOSEU NEGATIVE 06/12/2018 2123   HGBUR NEGATIVE 06/12/2018 2123   BILIRUBINUR NEGATIVE 06/12/2018 2123   KETONESUR NEGATIVE 06/12/2018 2123   PROTEINUR NEGATIVE 06/12/2018 2123   NITRITE NEGATIVE 06/12/2018 2123   LEUKOCYTESUR NEGATIVE 06/12/2018 2123    ----------------------------------------------------------------------------------------------------------------   Imaging Results:    Ct Head Wo Contrast  Result Date: 06/12/2018 CLINICAL DATA:  Confusion.  Altered mental status. EXAM: CT HEAD WITHOUT CONTRAST TECHNIQUE: Contiguous axial images were obtained from the base of the skull through the vertex without intravenous contrast. COMPARISON:  None. FINDINGS: Brain: The brainstem, cerebellum, cerebral peduncles, thalami, basal ganglia, basilar cisterns, and  ventricular system appear within normal limits. No intracranial hemorrhage, mass lesion, or acute CVA. Vascular: Unremarkable Skull: Unremarkable Sinuses/Orbits: Unremarkable Other: No supplemental non-categorized findings. IMPRESSION: 1.  No significant abnormality identified. Electronically Signed   By: Van Clines M.D.   On: 06/12/2018 21:10       Assessment & Plan:    Principal Problem:   Altered  mental status Active Problems:   HTN (hypertension)   Anemia   Dysarthria   Renal insufficiency    AMS, Dysarthria, ? Expressive/ receptive aphasia ddx CVA vs THC intoxication Check b12, folate, esr, ana, rpr, tsh Check hga1c, lipid Check MRI/ MRA  brain Check carotid ultrasound Check cardiac echo counselled on cessation of marijuana use Cont aspirin Cont plavix Cont lipitor 60m po qhs   Hypertension Cont metoprolol XL 267mpo qday Cont Chlorthalidone 12.34m23mo qday Cont amlodipine 34mg50m qday Stop Lasix 20mg47mqday  PAD Cont aspirin, plavix as above Cont Lipitor  Neuropathy Cont Gabapentin 300mg 2mhs    DVT Prophylaxis - SCDs    AM Labs Ordered, also please review Full Orders  Family Communication: Admission, patients condition and plan of care including tests being ordered have been discussed with the patient  who indicate understanding and agree with the plan and Code Status.  Code Status  FULL CODE  Likely DC to  home  Condition GUARDED    Consults called:  Neurology, appreciate input  Admission status: inpatient  Time spent in minutes : 70   [place] Jani Graveln 06/12/2018 at 10:26 PM  Between 7am to 7pm - Pager - [phone] 7pm go to [email or web address] - password [email or web address]  [facility] - Office  [phone]

## 2018-06-12 NOTE — ED Provider Notes (Signed)
[facility]-EMERGENCY DEPT Provider Note   CSN: [id number] Arrival date & time: 06/12/18  2004     History   Chief Complaint Chief Complaint  Patient presents with  . Altered Mental Status    HPI Jesus Lam is a 84 y.o. male who presents today for concern for stroke.  Patient is reportedly having a hard time getting his words out.  Patient's daughter reports that the father showed up at her house confused and had walked to her house when she was supposed to pick him up at the postop.  He reports that his slurred speech started at 1, unsure if this is reliable or not, last seen normal otherwise would be over 24 hours ago.    Patient indicates that his head hurts but further history is unable to be obtained from speech difficulties.  Patient talks but it is jumbled.   HPI  Past Medical History:  Diagnosis Date  . BPH (benign prostatic hyperplasia)   . Cataracts, bilateral   . Dyslipidemia   . Gout   . HTN (hypertension)   . Osteoarthritis   . PAD (peripheral artery disease) (HCC)   . Stasis dermatitis   . Vitamin D deficiency     Patient Active Problem List   Diagnosis Date Noted  . Altered mental status 06/12/2018  . Dysarthria 06/12/2018  . Renal insufficiency 06/12/2018  . Claudication (HCC) 01/11/2018  . Anemia 10/10/2017  . Cellulitis of leg, left 11/04/2013  . HTN (hypertension) 11/04/2013    Past Surgical History:  Procedure Laterality Date  . ABDOMINAL AORTOGRAM N/A 01/13/2018   Procedure: ABDOMINAL AORTOGRAM;  Surgeon: Elder Negus, MD;  Location: [facility]IVE CV LAB;  Service: Cardiovascular;  Laterality: N/A;  . CATARACT EXTRACTION, BILATERAL    . LEFT HEART CATH AND CORONARY ANGIOGRAPHY N/A 01/13/2018   Procedure: LEFT HEART CATH AND CORONARY ANGIOGRAPHY;  Surgeon: Elder Negus, MD;  Location: [facility];  Service: Cardiovascular;  Laterality: N/A;  . LOWER EXTREMITY ANGIOGRAPHY N/A 01/13/2018   Procedure:  LOWER EXTREMITY ANGIOGRAPHY;  Surgeon: Elder Negus, MD;  Location: [facility];  Service: Cardiovascular;  Laterality: N/A;  . LOWER EXTREMITY ANGIOGRAPHY Right 01/27/2018   Procedure: LOWER EXTREMITY ANGIOGRAPHY;  Surgeon: Yates Decamp, MD;  Location: [facility];  Service: Cardiovascular;  Laterality: Right;  . PERIPHERAL VASCULAR ATHERECTOMY Left 01/13/2018   Procedure: PERIPHERAL VASCULAR ATHERECTOMY;  Surgeon: Elder Negus, MD;  Location: [facility];  Service: Cardiovascular;  Laterality: Left;  SFA WITH PTA DRUG COATED BALLOON  . PERIPHERAL VASCULAR INTERVENTION  01/27/2018   Procedure: PERIPHERAL VASCULAR INTERVENTION;  Surgeon: Yates Decamp, MD;  Location: [facility];  Service: Cardiovascular;;        Home Medications    Prior to Admission medications   Medication Sig Start Date End Date Taking? Authorizing Provider  amLODipine (NORVASC) 5 MG tablet Take 1 tablet (5 mg total) by mouth daily. 01/13/18  Yes Patwardhan, Anabel Bene, MD  aspirin EC 81 MG tablet Take 81 mg by mouth daily.   Yes [provider]  atorvastatin (LIPITOR) 80 MG tablet Take 80 mg by mouth daily.   Yes [provider]  chlorthalidone (HYGROTON) 25 MG tablet Take 12.5 mg by mouth daily. 05/13/18  Yes [provider]  Cholecalciferol (VITAMIN D3) 5000 units CAPS Take 5,000 Units by mouth daily. 01/09/18  Yes [provider]  clopidogrel (PLAVIX) 75 MG tablet Take 1 tablet (75  mg total) by mouth daily with breakfast. 01/14/18  Yes Patwardhan, Manish J, MD  colchicine 0.6 MG tablet Take 1 tab daily until 48 hours after resolution of symptoms, or up to 7 days. 01/17/18  Yes Robinson, [place] N, PA-C  furosemide (LASIX) 20 MG tablet Take 20 mg by mouth daily. 01/07/18  Yes [provider]  gabapentin (NEURONTIN) 300 MG capsule Take 300 mg by mouth at bedtime.   Yes [provider]  metoprolol succinate (TOPROL-XL) 25 MG 24 hr tablet Take  25 mg by mouth daily. 01/07/18  Yes [provider]  Multiple Vitamin (MULTIVITAMIN) capsule Take 1 capsule daily by mouth.   Yes [provider]  tamsulosin (FLOMAX) 0.4 MG CAPS capsule Take 1 capsule (0.4 mg total) by mouth daily after supper. 06/27/17  Yes Coralyn Mark, NP  vitamin C (ASCORBIC ACID) 500 MG tablet Take 500 mg daily by mouth.   Yes [provider]    Family History Family History  Problem Relation Age of Onset  . Cancer Mother   . Cancer Father   . Alcohol abuse Father   . Breast cancer Daughter   . CVA Daughter     Social History Social History   Tobacco Use  . Smoking status: Never Smoker  . Smokeless tobacco: Never Used  Substance Use Topics  . Alcohol use: No    Frequency: Never    Comment: occasionally  . Drug use: Yes    Types: Marijuana    Comment: 1 oz. per week     Allergies   Shellfish allergy   Review of Systems Review of Systems  Unable to perform ROS: Acuity of condition  Neurological: Positive for speech difficulty and headaches. Negative for facial asymmetry.   Speech difficulties.   Physical Exam Updated Vital Signs BP (!) 155/82 (BP Location: Right Arm)   Pulse 67   Temp 98.8 F (37.1 C)   Resp 18   SpO2 93%   Physical Exam  Constitutional: He appears well-developed and well-nourished. No distress.  HENT:  Head: Normocephalic and atraumatic.  Mouth/Throat: Oropharynx is clear and moist.  Eyes: Pupils are equal, round, and reactive to light. Conjunctivae and EOM are normal. Right eye exhibits no discharge. Left eye exhibits no discharge. No scleral icterus.  Neck: Normal range of motion. Neck supple.  Cardiovascular: Normal rate, regular rhythm and intact distal pulses.  Pulmonary/Chest: Effort normal. No stridor. No respiratory distress.  Abdominal: Soft. Bowel sounds are normal. He exhibits no distension. There is no tenderness. There is no guarding.  Musculoskeletal: He exhibits no edema  or deformity.  Neurological: He is alert. No sensory deficit. He exhibits normal muscle tone.  Mental Status:  Alert.  Speech is dysarthric and slurred.  Patient is unable to name objects or write the words for objects.  He is able to write his name.  When asked to repeat a word he can say it.  When speaking sentences his words are jumbled. Cranial Nerves:  II:  Peripheral visual fields grossly normal, pupils equal, round, reactive to light III,IV, VI: ptosis not present, extra-ocular motions intact bilaterally  V,VII: smile symmetric, facial light touch sensation equal VIII: hearing grossly normal to voice  X: uvula elevates symmetrically  XI: bilateral shoulder shrug symmetric and strong XII: midline tongue extension without fassiculations Motor:  Normal tone. 5/5 in upper and lower extremities bilaterally including strong and equal grip strength and dorsiflexion/plantar flexion Cerebellar: normal finger-to-nose with bilateral upper extremities CV: distal pulses  palpable throughout    Skin: Skin is warm and dry. He is not diaphoretic.  Psychiatric: He has a normal mood and affect. His behavior is normal.  Nursing note and vitals reviewed.    ED Treatments / Results  Labs (all labs ordered are listed, but only abnormal results are displayed) Labs Reviewed  CBC - Abnormal; Notable for the following components:      Result Value   RBC 4.03 (*)    Hemoglobin 11.7 (*)    HCT 36.4 (*)    All other components within normal limits  COMPREHENSIVE METABOLIC PANEL - Abnormal; Notable for the following components:   Glucose, Bld 105 (*)    Creatinine, Ser 1.30 (*)    GFR calc non Af Amer 49 (*)    GFR calc Af Amer 57 (*)    All other components within normal limits  RAPID URINE DRUG SCREEN, HOSP PERFORMED - Abnormal; Notable for the following components:   Tetrahydrocannabinol POSITIVE (*)    All other components within normal limits  I-STAT CHEM 8, ED - Abnormal; Notable for the  following components:   Creatinine, Ser 1.40 (*)    Glucose, Bld 102 (*)    Hemoglobin 11.9 (*)    HCT 35.0 (*)    All other components within normal limits  ETHANOL  PROTIME-INR  APTT  DIFFERENTIAL  URINALYSIS, ROUTINE W REFLEX MICROSCOPIC  HEMOGLOBIN A1C  LIPID PANEL  I-STAT TROPONIN, ED    EKG EKG Interpretation  Date/Time:  Friday June 12 2018 20:16:54 EDT Ventricular Rate:  82 PR Interval:    QRS Duration: 131 QT Interval:  393 QTC Calculation: 459 R Axis:   -54 Text Interpretation:  Sinus or ectopic atrial rhythm Ventricular premature complex Prolonged PR interval Nonspecific IVCD with LAD Left ventricular hypertrophy Anterior Q waves, possibly due to LVH No STEMI.  Confirmed by Alona Bene [phone]) on 06/12/2018 8:38:08 PM   Radiology Ct Head Wo Contrast  Result Date: 06/12/2018 CLINICAL DATA:  Confusion.  Altered mental status. EXAM: CT HEAD WITHOUT CONTRAST TECHNIQUE: Contiguous axial images were obtained from the base of the skull through the vertex without intravenous contrast. COMPARISON:  None. FINDINGS: Brain: The brainstem, cerebellum, cerebral peduncles, thalami, basal ganglia, basilar cisterns, and ventricular system appear within normal limits. No intracranial hemorrhage, mass lesion, or acute CVA. Vascular: Unremarkable Skull: Unremarkable Sinuses/Orbits: Unremarkable Other: No supplemental non-categorized findings. IMPRESSION: 1.  No significant abnormality identified. Electronically Signed   By: Gaylyn Rong M.D.   On: 06/12/2018 21:10    Procedures Procedures (including critical care time)  Medications Ordered in ED Medications   stroke: mapping our early stages of recovery book (has no administration in time range)  acetaminophen (TYLENOL) tablet 650 mg (has no administration in time range)    Or  acetaminophen (TYLENOL) solution 650 mg (has no administration in time range)    Or  acetaminophen (TYLENOL) suppository 650 mg (has no  administration in time range)  aspirin suppository 300 mg (has no administration in time range)    Or  aspirin tablet 325 mg (has no administration in time range)     Initial Impression / Assessment and Plan / ED Course  I have reviewed the triage vital signs and the nursing notes.  Pertinent labs & imaging results that were available during my care of the patient were reviewed by me and considered in my medical decision making (see chart for details).  Clinical Course as of Jun 13 2255  Fri Jun 12, 2018  2024 Patient is out of code stroke window   [EH]  2133 Spoke with tele neuro who recommended admission.    [EH]  2223 Spoke with Dr. Selena Batten who will see patient.    [EH]    Clinical Course User Index [EH] Cristina Gong, PA-C   Patient presents today for evaluation of speech difficulties.  He reports that his speech was last normal at around 1 PM, however his last seen normal by anyone else would be over 24 hours ago.  Patient is outside the code stroke window.  His speech is slurred in addition to difficulty with word finding and naming objects.  Patient otherwise appears neurologically intact.  CT head was performed which did not show any hemorrhage or other large abnormalities.  Labs were obtained and reviewed, his creatinine is slightly bumped from February of this year when it was 1.15-1.30 that it is today, however labs are generally unremarkable.  Telemetry neurology was consulted who evaluated patient, recommended MRI and inpatient evaluation.  I spoke with the hospitalist who agreed to admit patient.   This patient was seen as a shared visit with Dr. Jacqulyn Bath who evaluated the patient.   Final Clinical Impressions(s) / ED Diagnoses   Final diagnoses:  Altered mental status    ED Discharge Orders    None       Norman Clay 06/12/18 2259    Maia Plan, MD 06/12/18 2351

## 2018-06-12 NOTE — ED Triage Notes (Signed)
Per daughter: Pt reports her father showed up at her house confused and he walked there when she was [place] the bus stops.  Pt's daughter reports he did not have any of the FAST signs.  Pt is shivering and reports he is very cold. Pt is unable to give proper responses and get his words out correctly.

## 2018-06-12 NOTE — ED Notes (Signed)
Patient transported to CT 

## 2018-06-13 ENCOUNTER — Inpatient Hospital Stay (HOSPITAL_COMMUNITY): Payer: Medicare HMO

## 2018-06-13 ENCOUNTER — Other Ambulatory Visit: Payer: Self-pay

## 2018-06-13 DIAGNOSIS — I639 Cerebral infarction, unspecified: Secondary | ICD-10-CM

## 2018-06-13 DIAGNOSIS — R4182 Altered mental status, unspecified: Secondary | ICD-10-CM

## 2018-06-13 DIAGNOSIS — I739 Peripheral vascular disease, unspecified: Secondary | ICD-10-CM | POA: Diagnosis present

## 2018-06-13 HISTORY — DX: Cerebral infarction, unspecified: I63.9

## 2018-06-13 LAB — LIPID PANEL
CHOL/HDL RATIO: 4.4 ratio
Cholesterol: 135 mg/dL (ref 0–200)
HDL: 31 mg/dL — AB (ref >40)
LDL Cholesterol: 86 mg/dL (ref 0–99)
TRIGLYCERIDES: 92 mg/dL (ref <150)
VLDL: 18 mg/dL (ref 0–40)

## 2018-06-13 LAB — ECHOCARDIOGRAM COMPLETE
Height: 72 in
Weight: 3612.02 oz

## 2018-06-13 LAB — HEMOGLOBIN A1C
Hgb A1c MFr Bld: 5.3 % (ref 4.8–5.6)
Mean Plasma Glucose: 105.41 mg/dL

## 2018-06-13 MED ORDER — SODIUM CHLORIDE 0.9 % IV SOLN
INTRAVENOUS | Status: DC
  Administered 2018-06-13: 12:00:00 via INTRAVENOUS

## 2018-06-13 MED ORDER — AMLODIPINE BESYLATE 5 MG PO TABS
5.0000 mg | ORAL_TABLET | Freq: Every day | ORAL | Status: DC
  Administered 2018-06-13 – 2018-06-14 (×2): 5 mg via ORAL
  Filled 2018-06-13 (×2): qty 1

## 2018-06-13 MED ORDER — TAMSULOSIN HCL 0.4 MG PO CAPS
0.4000 mg | ORAL_CAPSULE | Freq: Every day | ORAL | Status: DC
  Administered 2018-06-13: 0.4 mg via ORAL
  Filled 2018-06-13 (×2): qty 1

## 2018-06-13 MED ORDER — VITAMIN D 1000 UNITS PO TABS
5000.0000 [IU] | ORAL_TABLET | Freq: Every day | ORAL | Status: DC
  Administered 2018-06-13 – 2018-06-14 (×2): 5000 [IU] via ORAL
  Filled 2018-06-13 (×2): qty 5

## 2018-06-13 MED ORDER — VITAMIN C 500 MG PO TABS
500.0000 mg | ORAL_TABLET | Freq: Every day | ORAL | Status: DC
  Administered 2018-06-13 – 2018-06-14 (×2): 500 mg via ORAL
  Filled 2018-06-13 (×2): qty 1

## 2018-06-13 MED ORDER — LORAZEPAM 2 MG/ML IJ SOLN
1.0000 mg | Freq: Once | INTRAMUSCULAR | Status: AC
  Administered 2018-06-13: 1 mg via INTRAVENOUS
  Filled 2018-06-13: qty 1

## 2018-06-13 MED ORDER — LORAZEPAM 2 MG/ML IJ SOLN
1.0000 mg | Freq: Once | INTRAMUSCULAR | Status: AC
Start: 2018-06-13 — End: 2018-06-13
  Administered 2018-06-13: 1 mg via INTRAVENOUS

## 2018-06-13 MED ORDER — CHLORTHALIDONE 25 MG PO TABS
12.5000 mg | ORAL_TABLET | Freq: Every day | ORAL | Status: DC
  Administered 2018-06-13 – 2018-06-14 (×2): 12.5 mg via ORAL
  Filled 2018-06-13 (×2): qty 1

## 2018-06-13 MED ORDER — ATORVASTATIN CALCIUM 80 MG PO TABS
80.0000 mg | ORAL_TABLET | Freq: Every day | ORAL | Status: DC
  Administered 2018-06-13 – 2018-06-14 (×2): 80 mg via ORAL
  Filled 2018-06-13 (×2): qty 1

## 2018-06-13 MED ORDER — CLOPIDOGREL BISULFATE 75 MG PO TABS
75.0000 mg | ORAL_TABLET | Freq: Every day | ORAL | Status: DC
  Administered 2018-06-13 – 2018-06-14 (×2): 75 mg via ORAL
  Filled 2018-06-13 (×2): qty 1

## 2018-06-13 MED ORDER — GABAPENTIN 300 MG PO CAPS
300.0000 mg | ORAL_CAPSULE | Freq: Every day | ORAL | Status: DC
  Administered 2018-06-13: 300 mg via ORAL
  Filled 2018-06-13: qty 1

## 2018-06-13 MED ORDER — ADULT MULTIVITAMIN W/MINERALS CH
1.0000 | ORAL_TABLET | Freq: Every day | ORAL | Status: DC
  Administered 2018-06-13 – 2018-06-14 (×2): 1 via ORAL
  Filled 2018-06-13 (×2): qty 1

## 2018-06-13 MED ORDER — METOPROLOL SUCCINATE ER 25 MG PO TB24
25.0000 mg | ORAL_TABLET | Freq: Every day | ORAL | Status: DC
  Administered 2018-06-13 – 2018-06-14 (×2): 25 mg via ORAL
  Filled 2018-06-13 (×2): qty 1

## 2018-06-13 MED ORDER — MULTIVITAMINS PO CAPS: 1.0000 | ORAL_CAPSULE | Freq: Every day | ORAL | Status: DC

## 2018-06-13 NOTE — Progress Notes (Signed)
STROKE TEAM PROGRESS NOTE   HISTORY OF PRESENT ILLNESS (per record) Jesus Lam is an 84 y.o. male who presented to the [facility] ED on Friday evening with confusion. The patient was noted to be shivering on arrival to the ED and reported that he felt very cold. He was unable to give proper responses to the Triage nurse and could not "get his words out correctly". There was concern for possible stroke so Teleneurology was consulted. No significant abnormality was seen on CT head. EKG was positive for several abnormalities but no atrial fibrillation was present. The patient was out of the tPA time window and there was no concern for LVO. Teleneurologist recommended admission for stroke work up.   BUN: Elevated at 1.4 Na and Ca: Normal LFTs: Normal Troponin: Normal CBC: No white count, Hgb low at 11.9 Coags: Normal U/A: Normal EtOH level: < 10 Tox screen: Positive for THC  CXR: Cardiomegaly with tortuous atherosclerotic aorta. Mild vascular congestion.  Home medications include ASA, Plavix and Lipitor.      SUBJECTIVE (INTERVAL HISTORY) His daughter is at the bedside.   [name] remains aphasic but is able to communicate with difficulty. MRI shows an embolic left MCA infarct. Echocardiogram is pending. Carotid Doppler shows mild bilateral plaques but no significant stenosis. Patient and family seem interested in considering participation in the [employer] stroke study    OBJECTIVE Temp:  [98.1 F (36.7 C)-98.8 F (37.1 C)] 98.2 F (36.8 C) (07/20 0300) Pulse Rate:  [43-86] 68 (07/20 0751) Cardiac Rhythm: Normal sinus rhythm;Heart block;Bundle branch block (07/20 0723) Resp:  [15-25] 16 (07/20 0751) BP: (122-155)/(56-84) 122/80 (07/20 0751) SpO2:  [93 %-99 %] 97 % (07/20 0500) Weight:  [225 lb 12 oz (102.4 kg)] 225 lb 12 oz (102.4 kg) (07/20 0020)  CBC:  Recent Labs  Lab 06/12/18 2049 06/12/18 2100  WBC 6.6  --   NEUTROABS 3.9  --   HGB 11.7* 11.9*  HCT 36.4* 35.0*  MCV 90.3   --   PLT 177  --     Basic Metabolic Panel:  Recent Labs  Lab 06/12/18 2049 06/12/18 2100  NA 142 141  K 4.0 3.9  CL 108 106  CO2 26  --   GLUCOSE 105* 102*  BUN 20 18  CREATININE 1.30* 1.40*  CALCIUM 9.5  --     Lipid Panel:     Component Value Date/Time   CHOL 135 06/13/2018 1018   TRIG 92 06/13/2018 1018   HDL 31 (L) 06/13/2018 1018   CHOLHDL 4.4 06/13/2018 1018   VLDL 18 06/13/2018 1018   LDLCALC 86 06/13/2018 1018   HgbA1c:  Lab Results  Component Value Date   HGBA1C 5.3 06/13/2018   Urine Drug Screen:     Component Value Date/Time   LABOPIA NONE DETECTED 06/12/2018 2123   COCAINSCRNUR NONE DETECTED 06/12/2018 2123   LABBENZ NONE DETECTED 06/12/2018 2123   AMPHETMU NONE DETECTED 06/12/2018 2123   THCU POSITIVE (A) 06/12/2018 2123   LABBARB NONE DETECTED 06/12/2018 2123    Alcohol Level     Component Value Date/Time   ETH <10 06/12/2018 2049    IMAGING   Dg Chest 2 View 06/12/2018 IMPRESSION:  Cardiomegaly with tortuous atherosclerotic aorta. Mild vascular congestion.    Ct Head Wo Contrast 06/12/2018 IMPRESSION:  No significant abnormality identified.     Jesus Lam Head Wo Contrast 06/13/2018 IMPRESSION:   MRI HEAD  1. Acute ischemic nonhemorrhagic left temporo-occipital cortical infarct.  2. Age-related cerebral atrophy  with mild chronic small vessel ischemic disease.   MRA HEAD  1. Negative intracranial MRA with no large vessel occlusion identified. No proximal high-grade or correctable stenosis.  2. Minor atherosclerotic change for age.     Transthoracic Echocardiogram - pending 00/00/00    Bilateral Carotid Dopplers - pending 00/00/00     PHYSICAL EXAM Vitals:   06/13/18 0300 06/13/18 0400 06/13/18 0500 06/13/18 0751  BP: 138/65 (!) 145/56 (!) 147/60 122/80  Pulse:    68  Resp:    16  Temp: 98.2 F (36.8 C)     TempSrc: Oral     SpO2: 93% 94% 97%   Weight:      Height:       Pleasant obese elderly Caucasian  male currently not in distress. . Afebrile. Head is nontraumatic. Neck is supple without bruit.    Cardiac exam no murmur or gallop. Lungs are clear to auscultation. Distal pulses are well felt. Neurological Exam ;  Awake  Alert oriented x 2.  Moderate expressive aphasia and can speak short sentences. Good comprehension. Unable to name and repeat.eye movements full without nystagmus.fundi were not visualized. Vision acuity   appeara normal.decreased blink to threat on the right compared to the left. Hearing is normal. Palatal movements are normal. Face symmetric. Tongue midline. Normal strength, tone, reflexes and coordination. Normal sensation. Gait deferred.  NIHSS   5      ASSESSMENT/PLAN Jesus. Jesus Lam is a 84 y.o. male with history of peripheral artery disease, hypertension, and hyperlipidemia, presenting with confusion and speech difficulties. He did not receive IV t-PA due to late presentation.  Stroke: left temporo-occipital cortical infarct possibly embolic - source unknown.  Resultant expressive aphasia  CT head - No significant abnormality identified.   MRI head - Acute ischemic nonhemorrhagic left temporo-occipital cortical infarct.   MRA head - unremarkable  Carotid Doppler - b/l mild plaquing but no stenosis  2D Echo - pending  UDS - positive for marijuana  LDL - 86  HgbA1c - 5.3  VTE prophylaxis - SCDs Diet Order           Diet Heart Room service appropriate? Yes; Fluid consistency: Thin  Diet effective now          aspirin 81 mg daily and clopidogrel 75 mg daily prior to admission, now on aspirin 325 mg daily and clopidogrel 75 mg daily  Patient counseled to be compliant with his antithrombotic medications  Ongoing aggressive stroke risk factor management  Therapy recommendations:  pending  Disposition:  Pending  Hypertension  Stable . Permissive hypertension (OK if < 220/120) but gradually normalize in 5-7 days . Long-term BP goal  normotensive  Hyperlipidemia  Lipid lowering medication PTA: Lipitor 80 mg daily  LDL 86, goal < 70  Current lipid lowering medication: Lipitor 80 mg daily  Continue statin at discharge   Other Stroke Risk Factors  Advanced age  Marijuana use  Obesity, Body mass index is 30.62 kg/m., recommend weight loss, diet and exercise as appropriate   Family hx stroke (daughter)   Other Active Problems  Creatinine - 1.4  Mild anemia  Shellfish allergy      Hospital day # 1  I have personally examined this patient, reviewed notes, independently viewed imaging studies, participated in medical decision making and plan of care.ROS completed by me personally and pertinent positives fully documented  I have made any additions or clarifications directly to the above note. He presented with expressive aphasia due to left  MCA embolic infarct. Continue ongoing stroke workup. Start aspirin for stroke prevention. Patient met benefit with possible consolidation and participation in the [employer] stroke rather. I have discussed this with the patient and daughter and they appeared interested. Greater than 50% time during this 35 minute visit was spent on counseling and coordination of care about embolic stroke and answered questions. Antony Contras, MD Medical Director [facility] Pager: [phone] 06/13/2018 1:10 PM   To contact Stroke Continuity provider, please refer to [email or web address] After hours, contact General Neurology

## 2018-06-13 NOTE — Consult Note (Signed)
NEURO HOSPITALIST CONSULT NOTE   Requestig physician: Dr. Maudie Mercury  Reason for Consult: Confusion  History obtained from:  Wife and Chart     HPI:                                                                                                                                          Jesus Lam is an 84 y.o. male who presented to the [facility] ED on Friday evening with confusion. The patient was noted to be shivering on arrival to the ED and reported that he felt very cold. He was unable to give proper responses to the Triage nurse and could not "get his words out correctly". There was concern for possible stroke so Teleneurology was consulted. No significant abnormality was seen on CT head. EKG was positive for several abnormalities but no atrial fibrillation was present. The patient was out of the tPA time window and there was no concern for LVO. Teleneurologist recommended admission for stroke work up.   BUN: Elevated at 1.4 Na and Ca: Normal LFTs: Normal Troponin: Normal CBC: No white count, Hgb low at 11.9 Coags: Normal U/A: Normal EtOH level: < 10 Tox screen: Positive for THC  CXR: Cardiomegaly with tortuous atherosclerotic aorta. Mild vascular congestion.  Home medications include ASA, Plavix and Lipitor.   Past Medical History:  Diagnosis Date  . BPH (benign prostatic hyperplasia)   . Cataracts, bilateral   . Dyslipidemia   . Gout   . HTN (hypertension)   . Osteoarthritis   . PAD (peripheral artery disease) ([place])   . Stasis dermatitis   . Vitamin D deficiency     Past Surgical History:  Procedure Laterality Date  . ABDOMINAL AORTOGRAM N/A 01/13/2018   Procedure: ABDOMINAL AORTOGRAM;  Surgeon: Nigel Mormon, MD;  Location: [place] [facility];  Service: Cardiovascular;  Laterality: N/A;  . CATARACT EXTRACTION, BILATERAL    . LEFT HEART CATH AND CORONARY ANGIOGRAPHY N/A 01/13/2018   Procedure: LEFT HEART CATH AND CORONARY ANGIOGRAPHY;  Surgeon:  Nigel Mormon, MD;  Location: [place] [facility];  Service: Cardiovascular;  Laterality: N/A;  . LOWER EXTREMITY ANGIOGRAPHY N/A 01/13/2018   Procedure: LOWER EXTREMITY ANGIOGRAPHY;  Surgeon: Nigel Mormon, MD;  Location: [place] [facility];  Service: Cardiovascular;  Laterality: N/A;  . LOWER EXTREMITY ANGIOGRAPHY Right 01/27/2018   Procedure: LOWER EXTREMITY ANGIOGRAPHY;  Surgeon: Adrian Prows, MD;  Location: [place] [facility];  Service: Cardiovascular;  Laterality: Right;  . PERIPHERAL VASCULAR ATHERECTOMY Left 01/13/2018   Procedure: PERIPHERAL VASCULAR ATHERECTOMY;  Surgeon: Nigel Mormon, MD;  Location: [place] [facility];  Service: Cardiovascular;  Laterality: Left;  SFA WITH PTA DRUG COATED BALLOON  . PERIPHERAL VASCULAR INTERVENTION  01/27/2018   Procedure: PERIPHERAL VASCULAR INTERVENTION;  Surgeon: Adrian Prows, MD;  Location: [place] [facility];  Service: Cardiovascular;;    Family History  Problem Relation Age of Onset  . Cancer Mother   . Cancer Father   . Alcohol abuse Father   . Breast cancer Daughter   . CVA Daughter              Social History:  reports that he has never smoked. He has never used smokeless tobacco. He reports that he has current or past drug history. Drug: Marijuana. He reports that he does not drink alcohol.  Allergies  Allergen Reactions  . Shellfish Allergy Other (See Comments)    Gout     MEDICATIONS:                                                                                                                     Current Meds  Medication Sig  . amLODipine (NORVASC) 5 MG tablet Take 1 tablet (5 mg total) by mouth daily.  Marland Kitchen aspirin EC 81 MG tablet Take 81 mg by mouth daily.  Marland Kitchen atorvastatin (LIPITOR) 80 MG tablet Take 80 mg by mouth daily.  . chlorthalidone (HYGROTON) 25 MG tablet Take 12.5 mg by mouth daily.  . Cholecalciferol (VITAMIN D3) 5000 units CAPS Take 5,000 Units by mouth daily.  . clopidogrel (PLAVIX) 75 MG tablet Take 1  tablet (75 mg total) by mouth daily with breakfast.  . colchicine 0.6 MG tablet Take 1 tab daily until 48 hours after resolution of symptoms, or up to 7 days.  . furosemide (LASIX) 20 MG tablet Take 20 mg by mouth daily.  Marland Kitchen gabapentin (NEURONTIN) 300 MG capsule Take 300 mg by mouth at bedtime.  . metoprolol succinate (TOPROL-XL) 25 MG 24 hr tablet Take 25 mg by mouth daily.  . Multiple Vitamin (MULTIVITAMIN) capsule Take 1 capsule daily by mouth.  . tamsulosin (FLOMAX) 0.4 MG CAPS capsule Take 1 capsule (0.4 mg total) by mouth daily after supper.  . vitamin C (ASCORBIC ACID) 500 MG tablet Take 500 mg daily by mouth.     ROS:                                                                                                                                       Unable to obtain due to aphasia.   Blood pressure 130/84, pulse 72, temperature 98.1 F (36.7 C), temperature source Oral, resp.  rate 20, height 6' (1.829 m), weight 102.4 kg (225 lb 12 oz), SpO2 99 %.   General Examination:                                                                                                       Physical Exam  HEENT-  [place]/AT    Lungs- Respirations unlabored Extremities- Warm and well perfused  Neurological Examination Mental Status: Awake and alert. Speech with prominent expressive and receptive aphasia, with halting quality and multiple errors of grammar and syntax. At times speech has a word salad quality. Naming, comprehension and repetition impaired. Mild dysarthria. Can follow some simple motor commands. No agitation. . Cranial Nerves: II: Does not blink to threat on the right. Right pupil irregular, 3 mm sluggish reactivity. Left pupil 4 mm with sluggish reactivity.   III,IV, VI: Able to track to left and right with saccadic pursuits noted. No nystagmus.  V,VII: Subtle right facial droop. Temp sensation intact bilaterally.  VIII: Hearing intact to voice.  IX,X: No hypophonia.  XI: No asymmetry  noted.  XII: Midline tongue extension Motor: Right : Upper extremity   5/5    Left:     Upper extremity   5/5  Lower extremity   5/5     Lower extremity   5/5 Normal tone throughout; no atrophy noted Sensory: Temp subjectively intact x 4. FT intact. No extinction.  Deep Tendon Reflexes: 1+ brachioradialis and biceps bilaterally. 1+ right patella, trace left patella, 0 achilles bilaterally. Toes upgoing bilaterally.  Cerebellar: No ataxia with FNF bilaterally.  Gait: Deferred   Lab Results: Basic Metabolic Panel: Recent Labs  Lab 06/12/18 2049 06/12/18 2100  NA 142 141  K 4.0 3.9  CL 108 106  CO2 26  --   GLUCOSE 105* 102*  BUN 20 18  CREATININE 1.30* 1.40*  CALCIUM 9.5  --     CBC: Recent Labs  Lab 06/12/18 2049 06/12/18 2100  WBC 6.6  --   NEUTROABS 3.9  --   HGB 11.7* 11.9*  HCT 36.4* 35.0*  MCV 90.3  --   PLT 177  --     Cardiac Enzymes: No results for input(s): CKTOTAL, CKMB, CKMBINDEX, TROPONINI in the last 168 hours.  Lipid Panel: No results for input(s): CHOL, TRIG, HDL, CHOLHDL, VLDL, LDLCALC in the last 168 hours.  Imaging: Dg Chest 2 View  Result Date: 06/12/2018 CLINICAL DATA:  Altered mental status EXAM: CHEST - 2 VIEW COMPARISON:  None. FINDINGS: Cardiomegaly with mild uncoiling of the thoracic aorta. Minimal aortic atherosclerosis is noted. Mild central vascular congestion is seen without pulmonary consolidation. No effusion or pneumothorax. No acute osseous abnormality. IMPRESSION: Cardiomegaly with tortuous atherosclerotic aorta. Mild vascular congestion. Electronically Signed   By: Ashley Royalty M.D.   On: 06/12/2018 23:25   Ct Head Wo Contrast  Result Date: 06/12/2018 CLINICAL DATA:  Confusion.  Altered mental status. EXAM: CT HEAD WITHOUT CONTRAST TECHNIQUE: Contiguous axial images were obtained from the base of the skull through the vertex without intravenous contrast. COMPARISON:  None. FINDINGS: Brain: The  brainstem, cerebellum, cerebral  peduncles, thalami, basal ganglia, basilar cisterns, and ventricular system appear within normal limits. No intracranial hemorrhage, mass lesion, or acute CVA. Vascular: Unremarkable Skull: Unremarkable Sinuses/Orbits: Unremarkable Other: No supplemental non-categorized findings. IMPRESSION: 1.  No significant abnormality identified. Electronically Signed   By: Van Clines M.D.   On: 06/12/2018 21:10    Assessment: 84 year old male with aphasia.   1. Symptoms and exam findings are most consistent with a left MCA ischemic infarction involving the perisylvian cortices. Right visual field cut also noted on exam.  2. CT head negative for acute abnormality.  3. Stroke risk factors: HTN and PAD.   Recommendations: 1. Continue ASA, Plavix and Lipitor.  2. MRI brain, MRA head, carotid ultrasound, TTE and cardiac telemetry.  3. BP management.  4. Ammonia level, TSH, RPR, ESR, B12.  5. PT/OT/Speech  Electronically signed: Dr. Kerney Elbe 06/13/2018, 1:29 AM

## 2018-06-13 NOTE — Evaluation (Signed)
Occupational Therapy Evaluation Patient Details Name: Jesus Lam MRN: [id number] DOB: 21-Aug-1933 Today's Date: 06/13/2018    History of Present Illness Patient is a 84 y/o male presenting with AMS, dysarthria, and R visual impairments. Patient with a PMH significant for hypertension, PAD. No significant abnormality was seen on CT head. MRI revealing Acute ischemic nonhemorrhagic left temporo-occipital cortical infarct.   Clinical Impression   PTA, pt was living with his daughter. He initially reports independence but then reports that she assisted with ADL. PLOF difficult to obtain due to communication deficits. Pt currently requiring min guard assist for toilet transfers, min assist for LB ADL, and min guard assist for standing grooming tasks. Pt presents with some slightly diminished coordination in R UE as well as R visual field deficits. Full cognitive assessment limited by communication deficits. Pt would benefit from continued OT services while admitted to improve independence and safety with ADL and functional mobility. Recommend home health OT follow-up as well as 24 hour assistance from family. OT will continue to follow while admitted.     Follow Up Recommendations  Home health OT;Supervision/Assistance - 24 hour    Equipment Recommendations  3 in 1 bedside commode    Recommendations for Other Services       Precautions / Restrictions Precautions Precautions: Fall Restrictions Weight Bearing Restrictions: No      Mobility Bed Mobility Overal bed mobility: Needs Assistance Bed Mobility: Supine to Sit     Supine to sit: Min assist     General bed mobility comments: Min assist for scooting toward EOB.   Transfers Overall transfer level: Needs assistance Equipment used: None Transfers: Sit to/from Stand Sit to Stand: Min guard         General transfer comment: guarding for safety    Balance Overall balance assessment: Needs assistance Sitting-balance  support: No upper extremity supported;Feet supported Sitting balance-Leahy Scale: Fair     Standing balance support: No upper extremity supported;During functional activity Standing balance-Leahy Scale: Fair Standing balance comment: Min guard assist for dynamic tasks.                            ADL either performed or assessed with clinical judgement   ADL Overall ADL's : Needs assistance/impaired Eating/Feeding: Sitting;Supervision/ safety   Grooming: Min guard;Standing;Wash/dry hands   Upper Body Bathing: Set up;Sitting   Lower Body Bathing: Sit to/from stand;Minimal assistance   Upper Body Dressing : Set up;Sitting   Lower Body Dressing: Minimal assistance;Sit to/from stand   Toilet Transfer: Min guard;Ambulation   Toileting- Clothing Manipulation and Hygiene: Min guard;Sit to/from stand       Functional mobility during ADLs: Min guard General ADL Comments: Pt limited by communication deficits. He does demonstrate decreased functional use of vision on the R.      Vision Baseline Vision/History: No visual deficits Patient Visual Report: No change from baseline Vision Assessment?: Yes Eye Alignment: Within Functional Limits Ocular Range of Motion: Within Functional Limits Alignment/Gaze Preference: Within Defined Limits Tracking/Visual Pursuits: Able to track stimulus in all quads without difficulty;Other (comment)(some additional eye shifts during tracking) Visual Fields: Right visual field deficit Diplopia Assessment: (denies) Additional Comments: Pt with difficulty identifying objects in R visual field. Unable to complete confrontation testing successfully due to communication deficits.      Perception     Praxis      Pertinent Vitals/Pain Pain Assessment: No/denies pain     Hand Dominance Right   Extremity/Trunk  Assessment Upper Extremity Assessment Upper Extremity Assessment: RUE deficits/detail RUE Deficits / Details: slightly diminished  coordination RUE Coordination: decreased fine motor   Lower Extremity Assessment Lower Extremity Assessment: Generalized weakness   Cervical / Trunk Assessment Cervical / Trunk Assessment: Normal   Communication Communication Communication: No difficulties   Cognition Arousal/Alertness: Awake/alert Behavior During Therapy: WFL for tasks assessed/performed Overall Cognitive Status: Difficult to assess                                 General Comments: Pt follows commands intermittently. He demosntrates decreased understanding of verbal instructions but this is likely due to communication deficits.    General Comments  Pt's daughters initially present during session but stepped out.     Exercises     Shoulder Instructions      Home Living Family/patient expects to be discharged to:: Private residence Living Arrangements: Children Available Help at Discharge: Family;Available PRN/intermittently;Available 24 hours/day Type of Home: House Home Access: Stairs to enter [employer] of Steps: 3 Entrance Stairs-Rails: Left Home Layout: One level     Bathroom Shower/Tub: Chief Strategy Officer: Standard         Additional Comments: bathroom information from previous admission and will confirm with family      Prior Functioning/Environment Level of Independence: Independent                 OT Problem List: Decreased strength;Decreased range of motion;Decreased activity tolerance;Impaired balance (sitting and/or standing);Decreased safety awareness;Decreased knowledge of use of DME or AE;Decreased knowledge of precautions      OT Treatment/Interventions: Self-care/ADL training;Therapeutic exercise;Energy conservation;DME and/or AE instruction;Therapeutic activities;Patient/family education;Balance training    OT Goals(Current goals can be found in the care plan section) Acute Rehab OT Goals Patient Stated Goal: return home OT Goal  Formulation: With patient Time For Goal Achievement: 06/27/18 Potential to Achieve Goals: Good ADL Goals Pt Will Perform Grooming: with modified independence;standing Pt Will Perform Lower Body Dressing: with modified independence;sit to/from stand Pt Will Transfer to Toilet: with modified independence;ambulating;regular height toilet Pt Will Perform Toileting - Clothing Manipulation and hygiene: with modified independence;sit to/from stand Pt Will Perform Tub/Shower Transfer: with min assist;Tub transfer;ambulating;3 in 1 Additional ADL Goal #1: Pt will follow 3/4 multi-step commands during morning ADL routine independently. Additional ADL Goal #2: Pt will incorporate 2 strategies to compensate for R visual field loss into daily ADL routine independently.  OT Frequency: Min 2X/week   Barriers to D/C:            Co-evaluation              AM-PAC PT "6 Clicks" Daily Activity     Outcome Measure Help from another person eating meals?: A Little Help from another person taking care of personal grooming?: A Little Help from another person toileting, which includes using toliet, bedpan, or urinal?: A Little Help from another person bathing (including washing, rinsing, drying)?: A Little Help from another person to put on and taking off regular upper body clothing?: A Little Help from another person to put on and taking off regular lower body clothing?: A Little 6 Click Score: 18   End of Session Equipment Utilized During Treatment: Gait belt;Rolling walker Nurse Communication: Mobility status  Activity Tolerance: Patient tolerated treatment well Patient left: Other (comment)(ambulating in hallway with PT)  OT Visit Diagnosis: Other abnormalities of gait and mobility (R26.89);Cognitive communication deficit (R41.841) Symptoms  and signs involving cognitive functions: Cerebral infarction                Time: 2878-6767 OT Time Calculation (min): 28 min Charges:  OT General  Charges $OT Visit: 1 Visit OT Evaluation $OT Eval Moderate Complexity: 1 Mod OT Treatments $Self Care/Home Management : 8-22 mins G-Codes:     Doristine Section, MS OTR/L  Pager: [phone]   Macy Lingenfelter A Sopheap Boehle 06/13/2018, 5:46 PM

## 2018-06-13 NOTE — Progress Notes (Signed)
  Echocardiogram 2D Echocardiogram has been performed.  Delcie Roch 06/13/2018, 12:53 PM

## 2018-06-13 NOTE — Progress Notes (Addendum)
PROGRESS NOTE    Jesus Lam  XHB:[id number] DOB: 21-Dec-1932 DOA: 06/12/2018 PCP: Lindaann Pascal, PA-C   Brief Narrative: Patient is 84 year old male with past medical history of hypertension, peripheral artery disease who was brought to the emergency department for altered mental status and dysarthria.  Patient also complained of difficulty with vision on his right eye.  Stroke was suspected on presentation.  MRI/MRI of the brain showed Acute ischemic nonhemorrhagic left temporo-occipital cortical infarct. Neurology following.  Awaiting PT/OT recommendation.  Assessment & Plan:   Principal Problem:   Ischemic cerebrovascular accident (CVA) (HCC) Active Problems:   HTN (hypertension)   Anemia   Altered mental status   Dysarthria   Renal insufficiency   PAD (peripheral artery disease) (HCC)  Acute ischemic stroke:  MRI/MRI of the brain showed Acute ischemic nonhemorrhagic left temporo-occipital cortical infarct. Neurology following.  Undergoing echocardiogram and carotid Doppler studies.  PT/OT/speech evaluation.  Currently on aspirin and Plavix.  Continue statin.  Follow-up hemoglobin A1c and lipid panel.  Altered mental status/dysarthria:Much improved.  Currently he is on baseline.  AKI: Baseline kidney function normal.  Will start on gentle IV fluids.  Will check BMP tomorrow.  Hypertension: Continue current medications.  Currently blood pressure stable.  History of peripheral artery disease: Continue aspirin Plavix and statin.  Neuropathy: Continue gabapentin.  Marijuana abuse: Counseling provided for cessation    DVT prophylaxis:SCD Code Status: Full Family Communication: Daughter present at the bedside Disposition Plan: Depends upon PT evaluation   Consultants: Neurology  Procedures: None  Antimicrobials: None  Subjective:  Patient seen and examined the bedside this morning.  Currently he is alert and oriented.  Speech is clear.  No focal deficits on  neurological examination. Objective: Vitals:   06/13/18 0300 06/13/18 0400 06/13/18 0500 06/13/18 0751  BP: 138/65 (!) 145/56 (!) 147/60 122/80  Pulse:    68  Resp:    16  Temp: 98.2 F (36.8 C)     TempSrc: Oral     SpO2: 93% 94% 97%   Weight:      Height:       No intake or output data in the 24 hours ending 06/13/18 1009 Filed Weights   06/13/18 0020  Weight: 102.4 kg (225 lb 12 oz)    Examination:  General exam: Appears calm and comfortable ,Not in distress,average built HEENT:PERRL,Oral mucosa moist, Ear/Nose normal on gross exam Respiratory system: Bilateral equal air entry, normal vesicular breath sounds, no wheezes or crackles  Cardiovascular system: S1 & S2 heard, RRR. No JVD, murmurs, rubs, gallops or clicks. No pedal edema. Gastrointestinal system: Abdomen is nondistended, soft and nontender. No organomegaly or masses felt. Normal bowel sounds heard. Central nervous system: Alert and oriented. No focal neurological deficits. Extremities: No edema, no clubbing ,no cyanosis, distal peripheral pulses palpable. Skin: No rashes, lesions or ulcers,no icterus ,no pallor MSK: Normal muscle bulk,tone ,power Psychiatry: Judgement and insight appear normal. Mood & affect appropriate.     Data Reviewed: I have personally reviewed following labs and imaging studies  CBC: Recent Labs  Lab 06/12/18 2049 06/12/18 2100  WBC 6.6  --   NEUTROABS 3.9  --   HGB 11.7* 11.9*  HCT 36.4* 35.0*  MCV 90.3  --   PLT 177  --    Basic Metabolic Panel: Recent Labs  Lab 06/12/18 2049 06/12/18 2100  NA 142 141  K 4.0 3.9  CL 108 106  CO2 26  --   GLUCOSE 105* 102*  BUN 20 18  CREATININE 1.30* 1.40*  CALCIUM 9.5  --    GFR: Estimated Creatinine Clearance: 49.5 mL/min (A) (by C-G formula based on SCr of 1.4 mg/dL (H)). Liver Function Tests: Recent Labs  Lab 06/12/18 2049  AST 25  ALT 21  ALKPHOS 76  BILITOT 0.7  PROT 8.0  ALBUMIN 3.6   No results for input(s):  LIPASE, AMYLASE in the last 168 hours. No results for input(s): AMMONIA in the last 168 hours. Coagulation Profile: Recent Labs  Lab 06/12/18 2049  INR 1.04   Cardiac Enzymes: No results for input(s): CKTOTAL, CKMB, CKMBINDEX, TROPONINI in the last 168 hours. BNP (last 3 results) No results for input(s): PROBNP in the last 8760 hours. HbA1C: No results for input(s): HGBA1C in the last 72 hours. CBG: No results for input(s): GLUCAP in the last 168 hours. Lipid Profile: No results for input(s): CHOL, HDL, LDLCALC, TRIG, CHOLHDL, LDLDIRECT in the last 72 hours. Thyroid Function Tests: No results for input(s): TSH, T4TOTAL, FREET4, T3FREE, THYROIDAB in the last 72 hours. Anemia Panel: No results for input(s): VITAMINB12, FOLATE, FERRITIN, TIBC, IRON, RETICCTPCT in the last 72 hours. Sepsis Labs: No results for input(s): PROCALCITON, LATICACIDVEN in the last 168 hours.  No results found for this or any previous visit (from the past 240 hour(s)).       Radiology Studies: Dg Chest 2 View  Result Date: 06/12/2018 CLINICAL DATA:  Altered mental status EXAM: CHEST - 2 VIEW COMPARISON:  None. FINDINGS: Cardiomegaly with mild uncoiling of the thoracic aorta. Minimal aortic atherosclerosis is noted. Mild central vascular congestion is seen without pulmonary consolidation. No effusion or pneumothorax. No acute osseous abnormality. IMPRESSION: Cardiomegaly with tortuous atherosclerotic aorta. Mild vascular congestion. Electronically Signed   By: Tollie Eth M.D.   On: 06/12/2018 23:25   Ct Head Wo Contrast  Result Date: 06/12/2018 CLINICAL DATA:  Confusion.  Altered mental status. EXAM: CT HEAD WITHOUT CONTRAST TECHNIQUE: Contiguous axial images were obtained from the base of the skull through the vertex without intravenous contrast. COMPARISON:  None. FINDINGS: Brain: The brainstem, cerebellum, cerebral peduncles, thalami, basal ganglia, basilar cisterns, and ventricular system appear within  normal limits. No intracranial hemorrhage, mass lesion, or acute CVA. Vascular: Unremarkable Skull: Unremarkable Sinuses/Orbits: Unremarkable Other: No supplemental non-categorized findings. IMPRESSION: 1.  No significant abnormality identified. Electronically Signed   By: Gaylyn Rong M.D.   On: 06/12/2018 21:10   Mr Brain Wo Contrast  Result Date: 06/13/2018 CLINICAL DATA:  Initial evaluation for acute altered mental status, possible stroke. EXAM: MRI HEAD WITHOUT CONTRAST MRA HEAD WITHOUT CONTRAST TECHNIQUE: Multiplanar, multiecho pulse sequences of the brain and surrounding structures were obtained without intravenous contrast. Angiographic images of the head were obtained using MRA technique without contrast. COMPARISON:  Prior CT from 06/12/2018 FINDINGS: MRI HEAD FINDINGS Brain: Examination mildly degraded by motion artifact. Generalized age-related cerebral atrophy. Minimal T2/FLAIR hyperintensity within the periventricular deep white matter both cerebral hemispheres, most consistent with chronic small vessel ischemic disease, mild for age. Confluent restricted diffusion involving the cortical gray matter of the left temporal occipital region, consistent with acute ischemic infarct (series 5, image 60). Area involves posterior left MCA territory and/or left MCA/PCA watershed zone. No associated hemorrhage or mass effect. No other evidence for acute or subacute ischemia. Gray-white matter differentiation otherwise maintained. No other areas of remote cortical infarction. No acute or chronic intracranial hemorrhage. No mass lesion, midline shift or mass effect. No hydrocephalus. No extra-axial fluid collection. Pituitary gland normal. Vascular: Major intracranial vascular  flow voids maintained Skull and upper cervical spine: Craniocervical junction normal. Upper cervical spine within normal limits. Bone marrow signal intensity within normal limits. No scalp soft tissue abnormality. Sinuses/Orbits:  Globes and orbital soft tissues within normal limits. Patient status post ocular lens replacement bilaterally. Mild scattered mucosal thickening within the ethmoidal air cells, chronic in appearance. Paranasal sinuses are otherwise clear. No mastoid effusion. Inner ear structures normal. Other: None. MRA HEAD FINDINGS ANTERIOR CIRCULATION: Examination moderately degraded by motion artifact. Distal cervical segments of the internal carotid arteries are patent with antegrade flow. Petrous, cavernous, and supraclinoid segments patent without hemodynamically significant stenosis. A1 segments patent bilaterally. Anterior communicating artery not well assessed on this motion degraded exam. Anterior cerebral arteries patent to their distal aspects without obvious stenosis. Widely patent M1 segments. Distal MCA branches well perfused and symmetric. Distal small vessel atheromatous irregularity. POSTERIOR CIRCULATION: Vertebral arteries patent to the vertebrobasilar junction without significant stenosis. Left vertebral artery slightly dominant. Posterior inferior cerebral arteries patent proximally. Basilar patent to its distal aspect without stenosis. Superior cerebral arteries patent bilaterally. Both of the posterior cerebral artery supplied via the basilar artery. Atheromatous irregularity within the PCAs bilaterally without high-grade stenosis. PCAs are patent to their distal aspects. Small left posterior communicating artery noted. No aneurysm. IMPRESSION: MRI HEAD IMPRESSION: 1. Acute ischemic nonhemorrhagic left temporo-occipital cortical infarct. 2. Age-related cerebral atrophy with mild chronic small vessel ischemic disease. MRA HEAD IMPRESSION: 1. Negative intracranial MRA with no large vessel occlusion identified. No proximal high-grade or correctable stenosis. 2. Minor atherosclerotic change for age. Electronically Signed   By: Rise Mu M.D.   On: 06/13/2018 07:57   Mr Shirlee Latch NK  Contrast  Result Date: 06/13/2018 CLINICAL DATA:  Initial evaluation for acute altered mental status, possible stroke. EXAM: MRI HEAD WITHOUT CONTRAST MRA HEAD WITHOUT CONTRAST TECHNIQUE: Multiplanar, multiecho pulse sequences of the brain and surrounding structures were obtained without intravenous contrast. Angiographic images of the head were obtained using MRA technique without contrast. COMPARISON:  Prior CT from 06/12/2018 FINDINGS: MRI HEAD FINDINGS Brain: Examination mildly degraded by motion artifact. Generalized age-related cerebral atrophy. Minimal T2/FLAIR hyperintensity within the periventricular deep white matter both cerebral hemispheres, most consistent with chronic small vessel ischemic disease, mild for age. Confluent restricted diffusion involving the cortical gray matter of the left temporal occipital region, consistent with acute ischemic infarct (series 5, image 60). Area involves posterior left MCA territory and/or left MCA/PCA watershed zone. No associated hemorrhage or mass effect. No other evidence for acute or subacute ischemia. Gray-white matter differentiation otherwise maintained. No other areas of remote cortical infarction. No acute or chronic intracranial hemorrhage. No mass lesion, midline shift or mass effect. No hydrocephalus. No extra-axial fluid collection. Pituitary gland normal. Vascular: Major intracranial vascular flow voids maintained Skull and upper cervical spine: Craniocervical junction normal. Upper cervical spine within normal limits. Bone marrow signal intensity within normal limits. No scalp soft tissue abnormality. Sinuses/Orbits: Globes and orbital soft tissues within normal limits. Patient status post ocular lens replacement bilaterally. Mild scattered mucosal thickening within the ethmoidal air cells, chronic in appearance. Paranasal sinuses are otherwise clear. No mastoid effusion. Inner ear structures normal. Other: None. MRA HEAD FINDINGS ANTERIOR  CIRCULATION: Examination moderately degraded by motion artifact. Distal cervical segments of the internal carotid arteries are patent with antegrade flow. Petrous, cavernous, and supraclinoid segments patent without hemodynamically significant stenosis. A1 segments patent bilaterally. Anterior communicating artery not well assessed on this motion degraded exam. Anterior cerebral arteries patent to their  distal aspects without obvious stenosis. Widely patent M1 segments. Distal MCA branches well perfused and symmetric. Distal small vessel atheromatous irregularity. POSTERIOR CIRCULATION: Vertebral arteries patent to the vertebrobasilar junction without significant stenosis. Left vertebral artery slightly dominant. Posterior inferior cerebral arteries patent proximally. Basilar patent to its distal aspect without stenosis. Superior cerebral arteries patent bilaterally. Both of the posterior cerebral artery supplied via the basilar artery. Atheromatous irregularity within the PCAs bilaterally without high-grade stenosis. PCAs are patent to their distal aspects. Small left posterior communicating artery noted. No aneurysm. IMPRESSION: MRI HEAD IMPRESSION: 1. Acute ischemic nonhemorrhagic left temporo-occipital cortical infarct. 2. Age-related cerebral atrophy with mild chronic small vessel ischemic disease. MRA HEAD IMPRESSION: 1. Negative intracranial MRA with no large vessel occlusion identified. No proximal high-grade or correctable stenosis. 2. Minor atherosclerotic change for age. Electronically Signed   By: Rise Mu M.D.   On: 06/13/2018 07:57        Scheduled Meds: . amLODipine  5 mg Oral Daily  . aspirin  300 mg Rectal Daily   Or  . aspirin  325 mg Oral Daily  . atorvastatin  80 mg Oral Daily  . chlorthalidone  12.5 mg Oral Daily  . cholecalciferol  5,000 Units Oral Daily  . clopidogrel  75 mg Oral Q breakfast  . gabapentin  300 mg Oral QHS  . metoprolol succinate  25 mg Oral Daily   . multivitamin with minerals  1 tablet Oral Daily  . tamsulosin  0.4 mg Oral QPC supper  . vitamin C  500 mg Oral Daily   Continuous Infusions: . sodium chloride       LOS: 1 day    Time spent: 35 mins.More than 50% of that time was spent in counseling and/or coordination of care.      Burnadette Pop, MD [facility] Pager [phone]  If 7PM-7AM, please contact night-coverage [email or web address] Password [email or web address] 06/13/2018, 10:09 AM

## 2018-06-13 NOTE — Evaluation (Signed)
Physical Therapy Evaluation Patient Details Name: Jesus Lam MRN: [id number] DOB: Jul 22, 1933 Today's Date: 06/13/2018   History of Present Illness  Patient is a 84 y/o male presenting with AMS, dysarthria, and R visual impairments. Patient with a PMH significant for hypertension, PAD. No significant abnormality was seen on CT head. MRI revealing Acute ischemic nonhemorrhagic left temporo-occipital cortical infarct.  Clinical Impression  Mr. Carithers is a very pleasant 84 y/o male admitted with the above listed diagnosis. Patient reporting that prior to admission he lived with his daughter and was independent with mobility. Patient today requiring Min guard for mobility for general safety. Cueing throughout mobility due to R sided visual deficits - cueing for safety and obstacle navigation as patient seemingly with R inattention. PT recommending HHPT at discharge to ensure safe transition to the home environment. PT to continue to follow acutely.     Follow Up Recommendations Home health PT;Supervision/Assistance - 24 hour    Equipment Recommendations  None recommended by PT    Recommendations for Other Services       Precautions / Restrictions Precautions Precautions: Fall Restrictions Weight Bearing Restrictions: No      Mobility  Bed Mobility               General bed mobility comments: up with OT upon PT arrival  Transfers Overall transfer level: Needs assistance Equipment used: None Transfers: Sit to/from Stand Sit to Stand: Min guard         General transfer comment: for general safety  Ambulation/Gait Ambulation/Gait assistance: Min guard Gait Distance (Feet): 160 Feet Assistive device: None Gait Pattern/deviations: Step-to pattern;Step-through pattern;Decreased stride length;Drifts right/left Gait velocity: decreased   General Gait Details: Min guard for balance due to R visual deficits; requires cueing for safety as well as directions as patient  demonstrates poor awareness of R side  Stairs Stairs: Yes Stairs assistance: Min guard Stair Management: Two rails;Step to pattern;Forwards Number of Stairs: 2 General stair comments: guarding for safety  Wheelchair Mobility    Modified Rankin (Stroke Patients Only) Modified Rankin (Stroke Patients Only) Pre-Morbid Rankin Score: No significant disability Modified Rankin: Moderately severe disability     Balance Overall balance assessment: Needs assistance Sitting-balance support: No upper extremity supported;Feet supported Sitting balance-Leahy Scale: Fair     Standing balance support: No upper extremity supported;During functional activity Standing balance-Leahy Scale: Fair                               Pertinent Vitals/Pain Pain Assessment: No/denies pain    Home Living Family/patient expects to be discharged to:: Private residence Living Arrangements: Children Available Help at Discharge: Family;Available PRN/intermittently;Available 24 hours/day Type of Home: House Home Access: Stairs to enter Entrance Stairs-Rails: Left Entrance Stairs-Number of Steps: 3 Home Layout: One level        Prior Function Level of Independence: Independent               Hand Dominance        Extremity/Trunk Assessment   Upper Extremity Assessment Upper Extremity Assessment: Defer to OT evaluation    Lower Extremity Assessment Lower Extremity Assessment: Generalized weakness    Cervical / Trunk Assessment Cervical / Trunk Assessment: Normal  Communication   Communication: No difficulties  Cognition Arousal/Alertness: Awake/alert Behavior During Therapy: WFL for tasks assessed/performed Overall Cognitive Status: Difficult to assess  General Comments      Exercises     Assessment/Plan    PT Assessment Patient needs continued PT services  PT Problem List Decreased strength;Decreased  activity tolerance;Decreased balance;Decreased mobility;Decreased coordination;Decreased cognition;Decreased knowledge of use of DME;Decreased safety awareness;Decreased knowledge of precautions       PT Treatment Interventions DME instruction;Gait training;Stair training;Functional mobility training;Therapeutic activities;Therapeutic exercise;Balance training;Neuromuscular re-education;Patient/family education    PT Goals (Current goals can be found in the Care Plan section)  Acute Rehab PT Goals Patient Stated Goal: return home PT Goal Formulation: With patient Time For Goal Achievement: 06/27/18 Potential to Achieve Goals: Good    Frequency Min 4X/week   Barriers to discharge        Co-evaluation               AM-PAC PT "6 Clicks" Daily Activity  Outcome Measure Difficulty turning over in bed (including adjusting bedclothes, sheets and blankets)?: A Little Difficulty moving from lying on back to sitting on the side of the bed? : A Little Difficulty sitting down on and standing up from a chair with arms (e.g., wheelchair, bedside commode, etc,.)?: Unable Help needed moving to and from a bed to chair (including a wheelchair)?: A Little Help needed walking in hospital room?: A Little Help needed climbing 3-5 steps with a railing? : A Little 6 Click Score: 16    End of Session Equipment Utilized During Treatment: Gait belt Activity Tolerance: Patient tolerated treatment well Patient left: in chair;with call bell/phone within reach Nurse Communication: Mobility status PT Visit Diagnosis: Unsteadiness on feet (R26.81);Other abnormalities of gait and mobility (R26.89);Muscle weakness (generalized) (M62.81)    Time: 0347-4259 PT Time Calculation (min) (ACUTE ONLY): 16 min   Charges:   PT Evaluation $PT Eval Moderate Complexity: 1 Mod     PT G Codes:       Kipp Laurence, PT, DPT 06/13/18 2:31 PM Pager: [phone]

## 2018-06-13 NOTE — Progress Notes (Signed)
VASCULAR LAB PRELIMINARY  PRELIMINARY  PRELIMINARY  PRELIMINARY  Carotid duplex completed.    Preliminary report:  1-39% ICA plaquing. Vertebral artery flow is antegrade.   Damondre Pfeifle, RVT 06/13/2018, 12:40 PM

## 2018-06-13 NOTE — Care Management Note (Addendum)
Case Management Note  Patient Details  Name: Jesus Lam MRN: [id number] Date of Birth: [birth year]  Subjective/Objective:                  7/20 Patient admitted from home. Acute ischemic nonhemorrhagic left temporo-occipital cortical infarct. Neurology following.  Awaiting PT/OT recommendation.  7/21 Spoke w patient at bedside. He would like 3/1 per PT rec. Ordered, it will be delivered to room by [facility] today. Patient would like to discuss [facility] providers w daughter when she returns. List of providers left at bedside. OT added to [facility] order.     Action/Plan:   Expected Discharge Date:                  Expected Discharge Plan:     In-House Referral:     Discharge planning Services  CM Consult  Post Acute Care Choice:    Choice offered to:     DME Arranged:    DME Agency:     HH Arranged:    HH Agency:     Status of Service:  In process, will continue to follow  If discussed at Long Length of Stay Meetings, dates discussed:    Additional Comments:  Lawerance Sabal, RN 06/13/2018, 11:38 AM

## 2018-06-13 NOTE — Progress Notes (Signed)
Patient arrived via  EMS from [facility] [facility] at 0020. Skin assessment completed with no lesions noted, witnessed by Phyl, RN. Pt assessed, oriented to room and callbell, and left with monitor on. Family on the way. NAD, VSS, will continue to monitor closely. Paged admissions hospitalist and Dr. Otelia Limes with neurology that patient is here.

## 2018-06-14 LAB — BASIC METABOLIC PANEL
ANION GAP: 7 (ref 5–15)
BUN: 16 mg/dL (ref 8–23)
CALCIUM: 9.6 mg/dL (ref 8.9–10.3)
CO2: 28 mmol/L (ref 22–32)
Chloride: 107 mmol/L (ref 98–111)
Creatinine, Ser: 1.26 mg/dL — ABNORMAL HIGH (ref 0.61–1.24)
GFR calc non Af Amer: 51 mL/min — ABNORMAL LOW (ref >60)
GFR, EST AFRICAN AMERICAN: 59 mL/min — AB (ref >60)
Glucose, Bld: 109 mg/dL — ABNORMAL HIGH (ref 70–99)
Potassium: 3.8 mmol/L (ref 3.5–5.1)
Sodium: 142 mmol/L (ref 135–145)

## 2018-06-14 MED ORDER — ISOSORB DINITRATE-HYDRALAZINE 20-37.5 MG PO TABS: 1.0000 | ORAL_TABLET | Freq: Three times a day (TID) | ORAL | 0 refills | Status: DC

## 2018-06-14 MED ORDER — ASPIRIN 325 MG PO TABS: 325.0000 mg | ORAL_TABLET | Freq: Every day | ORAL | 0 refills | Status: DC

## 2018-06-14 MED ORDER — ISOSORB DINITRATE-HYDRALAZINE 20-37.5 MG PO TABS
1.0000 | ORAL_TABLET | Freq: Three times a day (TID) | ORAL | Status: DC
  Administered 2018-06-14: 1 via ORAL
  Filled 2018-06-14: qty 1

## 2018-06-14 NOTE — Care Management Note (Addendum)
Case Management Note  Patient Details  Name: Jesus Lam MRN: [id number] Date of Birth: 1933/06/30  Subjective/Objective:                 Spoke w patient and family at bedside. They would like DME RW and 3/1. Requested from [facility], they will be delivered to room prior to DC. Family would lke to use [facility]. They requested services PT OT HHA SW SLP, order modified. Referral accepted by [place]. No further CM needs.  Patient will DC to [address] [place] [address]. Please contact sister Juliette Alcide at [phone] to set up home health   Action/Plan:   Expected Discharge Date:  06/14/18               Expected Discharge Plan:  Home w Home Health Services  In-House Referral:     Discharge planning Services  CM Consult  Post Acute Care Choice:  Home Health, Durable Medical Equipment Choice offered to:  Patient, Adult Children  DME Arranged:  3-N-1, [place] rolling DME Agency:  [facility]  HH Arranged:  PT, OT, Nurse's Aide, Speech Therapy, Social Work [employer] Agency:  [facility]  Status of Service:  In process, will continue to follow  If discussed at Long Length of Stay Meetings, dates discussed:    Additional Comments:  Lawerance Sabal, RN 06/14/2018, 1:55 PM

## 2018-06-14 NOTE — Discharge Summary (Signed)
Physician Discharge Summary  Jesus Lam GUY:[id number] DOB: 10/19/1933 DOA: 06/12/2018  PCP: Lindaann Pascal, PA-C  Admit date: 06/12/2018 Discharge date: 06/14/2018  Admitted From: Home Disposition:  Home  Discharge Condition:Stable CODE STATUS:FULL Diet recommendation: Heart Healthy  Brief/Interim Summary: Patient is 84 year old male with past medical history of hypertension, peripheral artery disease who was brought to the emergency department for altered mental status and dysarthria.  Patient also complained of difficulty with vision on his right eye.  Stroke was suspected on presentation.  MRI/MRI of the brain showed Acute ischemic nonhemorrhagic left temporo-occipital cortical infarct. Neurology was following.    He was evaluated by PT/OT and was recommended home health on discharge.  Patient's echocardiogram showed reduced ejection fraction to 25 to 30%.  Cardiology evaluated him and adjusted the medications.  He will follow-up with cardiology and neurology on discharge as an outpatient.  Following problems were addressed during his hospitalization:   Acute ischemic stroke:  MRI/MRI of the brain showed Acute ischemic nonhemorrhagic left temporo-occipital cortical infarct. Neurology following.Underwent echocardiogram and carotid Doppler studies.  Carotid Doppler did not show any significant stenosis.  PT/OT/speech recommending HHPT.   Currently on aspirin and Plavix.Dose of aspiring changed to 325 on discharge.  Continue statin. Hemoglobin A1c of 5.3 and lipid panel shows LDL of 86.  Combined systolic and diastolic heart failure: Echocardiogram showed ejection fraction of 25 to 30%, diffuse hypokinesis, grade 1 diastolic dysfunction.  He follows with cardiology Dr. Jacinto Halim .Cardiac catheterization on 2/19 showed mild nonobstructive coronary artery disease. Dr. Jacinto Halim evaluated the patient here. He is on Lasix at home which is on hold due to AKI and we will continue it on discharge.  Patient  is not volume overloaded.  He is already on beta-blockers.  Patient has been started on Bidil. Marland KitchenHe will follow-up Cardiology for possible 30-day event monitoring.  Altered mental status/dysarthria:Resolved.  Currently he is on baseline.  AKI: Baseline kidney function normal.  Slight improvement today.  Hypertension: Continue current medications.  Currently blood pressure stable.  History of peripheral artery disease: Continue aspirin, Plavix and statin.Follows with cardiology.  Neuropathy: Continue gabapentin.  Marijuana abuse: Counseling provided for cessation       Discharge Diagnoses:  Principal Problem:   Ischemic cerebrovascular accident (CVA) (HCC) Active Problems:   HTN (hypertension)   Anemia   Altered mental status   Dysarthria   Renal insufficiency   PAD (peripheral artery disease) (HCC)    Discharge Instructions  Discharge Instructions    Diet - low sodium heart healthy   Complete by:  As directed    Discharge instructions   Complete by:  As directed    1) Follow up with your PCP in a week. 2) Follow up with cardiology and neurology as an outpatient. 3) Take prescribed medications as instructed.   Increase activity slowly   Complete by:  As directed      Allergies as of 06/14/2018      Reactions   Shellfish Allergy Other (See Comments)   Gout       Medication List    STOP taking these medications   amLODipine 5 MG tablet Commonly known as:  NORVASC   aspirin EC 81 MG tablet Replaced by:  aspirin 325 MG tablet   chlorthalidone 25 MG tablet Commonly known as:  HYGROTON     TAKE these medications   aspirin 325 MG tablet Take 1 tablet (325 mg total) by mouth daily. Start taking on:  06/15/2018 Replaces:  aspirin EC 81  MG tablet   atorvastatin 80 MG tablet Commonly known as:  LIPITOR Take 80 mg by mouth daily.   clopidogrel 75 MG tablet Commonly known as:  PLAVIX Take 1 tablet (75 mg total) by mouth daily with breakfast.    colchicine 0.6 MG tablet Take 1 tab daily until 48 hours after resolution of symptoms, or up to 7 days.   furosemide 20 MG tablet Commonly known as:  LASIX Take 20 mg by mouth daily.   gabapentin 300 MG capsule Commonly known as:  NEURONTIN Take 300 mg by mouth at bedtime.   isosorbide-hydrALAZINE 20-37.5 MG tablet Commonly known as:  BIDIL Take 1 tablet by mouth 3 (three) times daily.   metoprolol succinate 25 MG 24 hr tablet Commonly known as:  TOPROL-XL Take 25 mg by mouth daily.   multivitamin capsule Take 1 capsule daily by mouth.   tamsulosin 0.4 MG Caps capsule Commonly known as:  FLOMAX Take 1 capsule (0.4 mg total) by mouth daily after supper.   vitamin C 500 MG tablet Commonly known as:  ASCORBIC ACID Take 500 mg daily by mouth.   Vitamin D3 5000 units Caps Take 5,000 Units by mouth daily.      Follow-up Information    Long, Lorin Picket, [place]. Schedule an appointment as soon as possible for a visit in 1 week(s).   Specialty:  Physician Assistant Contact information: [address] [place] [address] [phone]        Micki Riley, MD. Schedule an appointment as soon as possible for a visit in 6 week(s).   Specialties:  Neurology, Radiology Contact information: [address] [place] [address] [phone]          Allergies  Allergen Reactions  . Shellfish Allergy Other (See Comments)    Gout     Consultations:  Neurology,Cardiology   Procedures/Studies: Dg Chest 2 View  Result Date: 06/12/2018 CLINICAL DATA:  Altered mental status EXAM: CHEST - 2 VIEW COMPARISON:  None. FINDINGS: Cardiomegaly with mild uncoiling of the thoracic aorta. Minimal aortic atherosclerosis is noted. Mild central vascular congestion is seen without pulmonary consolidation. No effusion or pneumothorax. No acute osseous abnormality. IMPRESSION: Cardiomegaly with tortuous atherosclerotic aorta. Mild vascular congestion. Electronically  Signed   By: Tollie Eth M.D.   On: 06/12/2018 23:25   Ct Head Wo Contrast  Result Date: 06/12/2018 CLINICAL DATA:  Confusion.  Altered mental status. EXAM: CT HEAD WITHOUT CONTRAST TECHNIQUE: Contiguous axial images were obtained from the base of the skull through the vertex without intravenous contrast. COMPARISON:  None. FINDINGS: Brain: The brainstem, cerebellum, cerebral peduncles, thalami, basal ganglia, basilar cisterns, and ventricular system appear within normal limits. No intracranial hemorrhage, mass lesion, or acute CVA. Vascular: Unremarkable Skull: Unremarkable Sinuses/Orbits: Unremarkable Other: No supplemental non-categorized findings. IMPRESSION: 1.  No significant abnormality identified. Electronically Signed   By: Gaylyn Rong M.D.   On: 06/12/2018 21:10   Mr Brain Wo Contrast  Result Date: 06/13/2018 CLINICAL DATA:  Initial evaluation for acute altered mental status, possible stroke. EXAM: MRI HEAD WITHOUT CONTRAST MRA HEAD WITHOUT CONTRAST TECHNIQUE: Multiplanar, multiecho pulse sequences of the brain and surrounding structures were obtained without intravenous contrast. Angiographic images of the head were obtained using MRA technique without contrast. COMPARISON:  Prior CT from 06/12/2018 FINDINGS: MRI HEAD FINDINGS Brain: Examination mildly degraded by motion artifact. Generalized age-related cerebral atrophy. Minimal T2/FLAIR hyperintensity within the periventricular deep white matter both cerebral hemispheres, most consistent with chronic small vessel ischemic  disease, mild for age. Confluent restricted diffusion involving the cortical gray matter of the left temporal occipital region, consistent with acute ischemic infarct (series 5, image 60). Area involves posterior left MCA territory and/or left MCA/PCA watershed zone. No associated hemorrhage or mass effect. No other evidence for acute or subacute ischemia. Gray-white matter differentiation otherwise maintained. No other  areas of remote cortical infarction. No acute or chronic intracranial hemorrhage. No mass lesion, midline shift or mass effect. No hydrocephalus. No extra-axial fluid collection. Pituitary gland normal. Vascular: Major intracranial vascular flow voids maintained Skull and upper cervical spine: Craniocervical junction normal. Upper cervical spine within normal limits. Bone marrow signal intensity within normal limits. No scalp soft tissue abnormality. Sinuses/Orbits: Globes and orbital soft tissues within normal limits. Patient status post ocular lens replacement bilaterally. Mild scattered mucosal thickening within the ethmoidal air cells, chronic in appearance. Paranasal sinuses are otherwise clear. No mastoid effusion. Inner ear structures normal. Other: None. MRA HEAD FINDINGS ANTERIOR CIRCULATION: Examination moderately degraded by motion artifact. Distal cervical segments of the internal carotid arteries are patent with antegrade flow. Petrous, cavernous, and supraclinoid segments patent without hemodynamically significant stenosis. A1 segments patent bilaterally. Anterior communicating artery not well assessed on this motion degraded exam. Anterior cerebral arteries patent to their distal aspects without obvious stenosis. Widely patent M1 segments. Distal MCA branches well perfused and symmetric. Distal small vessel atheromatous irregularity. POSTERIOR CIRCULATION: Vertebral arteries patent to the vertebrobasilar junction without significant stenosis. Left vertebral artery slightly dominant. Posterior inferior cerebral arteries patent proximally. Basilar patent to its distal aspect without stenosis. Superior cerebral arteries patent bilaterally. Both of the posterior cerebral artery supplied via the basilar artery. Atheromatous irregularity within the PCAs bilaterally without high-grade stenosis. PCAs are patent to their distal aspects. Small left posterior communicating artery noted. No aneurysm. IMPRESSION:  MRI HEAD IMPRESSION: 1. Acute ischemic nonhemorrhagic left temporo-occipital cortical infarct. 2. Age-related cerebral atrophy with mild chronic small vessel ischemic disease. MRA HEAD IMPRESSION: 1. Negative intracranial MRA with no large vessel occlusion identified. No proximal high-grade or correctable stenosis. 2. Minor atherosclerotic change for age. Electronically Signed   By: Rise Mu M.D.   On: 06/13/2018 07:57   Mr Shirlee Latch HC Contrast  Result Date: 06/13/2018 CLINICAL DATA:  Initial evaluation for acute altered mental status, possible stroke. EXAM: MRI HEAD WITHOUT CONTRAST MRA HEAD WITHOUT CONTRAST TECHNIQUE: Multiplanar, multiecho pulse sequences of the brain and surrounding structures were obtained without intravenous contrast. Angiographic images of the head were obtained using MRA technique without contrast. COMPARISON:  Prior CT from 06/12/2018 FINDINGS: MRI HEAD FINDINGS Brain: Examination mildly degraded by motion artifact. Generalized age-related cerebral atrophy. Minimal T2/FLAIR hyperintensity within the periventricular deep white matter both cerebral hemispheres, most consistent with chronic small vessel ischemic disease, mild for age. Confluent restricted diffusion involving the cortical gray matter of the left temporal occipital region, consistent with acute ischemic infarct (series 5, image 60). Area involves posterior left MCA territory and/or left MCA/PCA watershed zone. No associated hemorrhage or mass effect. No other evidence for acute or subacute ischemia. Gray-white matter differentiation otherwise maintained. No other areas of remote cortical infarction. No acute or chronic intracranial hemorrhage. No mass lesion, midline shift or mass effect. No hydrocephalus. No extra-axial fluid collection. Pituitary gland normal. Vascular: Major intracranial vascular flow voids maintained Skull and upper cervical spine: Craniocervical junction normal. Upper cervical spine within  normal limits. Bone marrow signal intensity within normal limits. No scalp soft tissue abnormality. Sinuses/Orbits: Globes and orbital soft tissues  within normal limits. Patient status post ocular lens replacement bilaterally. Mild scattered mucosal thickening within the ethmoidal air cells, chronic in appearance. Paranasal sinuses are otherwise clear. No mastoid effusion. Inner ear structures normal. Other: None. MRA HEAD FINDINGS ANTERIOR CIRCULATION: Examination moderately degraded by motion artifact. Distal cervical segments of the internal carotid arteries are patent with antegrade flow. Petrous, cavernous, and supraclinoid segments patent without hemodynamically significant stenosis. A1 segments patent bilaterally. Anterior communicating artery not well assessed on this motion degraded exam. Anterior cerebral arteries patent to their distal aspects without obvious stenosis. Widely patent M1 segments. Distal MCA branches well perfused and symmetric. Distal small vessel atheromatous irregularity. POSTERIOR CIRCULATION: Vertebral arteries patent to the vertebrobasilar junction without significant stenosis. Left vertebral artery slightly dominant. Posterior inferior cerebral arteries patent proximally. Basilar patent to its distal aspect without stenosis. Superior cerebral arteries patent bilaterally. Both of the posterior cerebral artery supplied via the basilar artery. Atheromatous irregularity within the PCAs bilaterally without high-grade stenosis. PCAs are patent to their distal aspects. Small left posterior communicating artery noted. No aneurysm. IMPRESSION: MRI HEAD IMPRESSION: 1. Acute ischemic nonhemorrhagic left temporo-occipital cortical infarct. 2. Age-related cerebral atrophy with mild chronic small vessel ischemic disease. MRA HEAD IMPRESSION: 1. Negative intracranial MRA with no large vessel occlusion identified. No proximal high-grade or correctable stenosis. 2. Minor atherosclerotic change for  age. Electronically Signed   By: Rise Mu M.D.   On: 06/13/2018 07:57       Subjective:   Discharge Exam: Vitals:   06/14/18 0735 06/14/18 0930  BP: 134/70 110/64  Pulse: 61 67  Resp: 18   Temp: 98.2 F (36.8 C)   SpO2: 98%    Vitals:   06/13/18 2329 06/14/18 0500 06/14/18 0735 06/14/18 0930  BP: 123/67 134/65 134/70 110/64  Pulse: 67 (!) 59 61 67  Resp:  20 18   Temp: 98.9 F (37.2 C) 98.8 F (37.1 C) 98.2 F (36.8 C)   TempSrc: Oral Oral Oral   SpO2: 97%  98%   Weight:      Height:        General: Pt is alert, awake, not in acute distress Cardiovascular: RRR, S1/S2 +, no rubs, no gallops Respiratory: CTA bilaterally, no wheezing, no rhonchi Abdominal: Soft, NT, ND, bowel sounds + Extremities: no edema, no cyanosis    The results of significant diagnostics from this hospitalization (including imaging, microbiology, ancillary and laboratory) are listed below for reference.     Microbiology: No results found for this or any previous visit (from the past 240 hour(s)).   Labs: BNP (last 3 results) No results for input(s): BNP in the last 8760 hours. Basic Metabolic Panel: Recent Labs  Lab 06/12/18 2049 06/12/18 2100 06/14/18 0255  NA 142 141 142  K 4.0 3.9 3.8  CL 108 106 107  CO2 26  --  28  GLUCOSE 105* 102* 109*  BUN 20 18 16   CREATININE 1.30* 1.40* 1.26*  CALCIUM 9.5  --  9.6   Liver Function Tests: Recent Labs  Lab 06/12/18 2049  AST 25  ALT 21  ALKPHOS 76  BILITOT 0.7  PROT 8.0  ALBUMIN 3.6   No results for input(s): LIPASE, AMYLASE in the last 168 hours. No results for input(s): AMMONIA in the last 168 hours. CBC: Recent Labs  Lab 06/12/18 2049 06/12/18 2100  WBC 6.6  --   NEUTROABS 3.9  --   HGB 11.7* 11.9*  HCT 36.4* 35.0*  MCV 90.3  --  PLT 177  --    Cardiac Enzymes: No results for input(s): CKTOTAL, CKMB, CKMBINDEX, TROPONINI in the last 168 hours. BNP: Invalid input(s): POCBNP CBG: No results for  input(s): GLUCAP in the last 168 hours. D-Dimer No results for input(s): DDIMER in the last 72 hours. Hgb A1c Recent Labs    06/13/18 1018  HGBA1C 5.3   Lipid Profile Recent Labs    06/13/18 1018  CHOL 135  HDL 31*  LDLCALC 86  TRIG 92  CHOLHDL 4.4   Thyroid function studies No results for input(s): TSH, T4TOTAL, T3FREE, THYROIDAB in the last 72 hours.  Invalid input(s): FREET3 Anemia work up No results for input(s): VITAMINB12, FOLATE, FERRITIN, TIBC, IRON, RETICCTPCT in the last 72 hours. Urinalysis    Component Value Date/Time   COLORURINE YELLOW 06/12/2018 2123   APPEARANCEUR CLEAR 06/12/2018 2123   LABSPEC 1.012 06/12/2018 2123   PHURINE 6.0 06/12/2018 2123   GLUCOSEU NEGATIVE 06/12/2018 2123   HGBUR NEGATIVE 06/12/2018 2123   BILIRUBINUR NEGATIVE 06/12/2018 2123   KETONESUR NEGATIVE 06/12/2018 2123   PROTEINUR NEGATIVE 06/12/2018 2123   NITRITE NEGATIVE 06/12/2018 2123   LEUKOCYTESUR NEGATIVE 06/12/2018 2123   Sepsis Labs Invalid input(s): PROCALCITONIN,  WBC,  LACTICIDVEN Microbiology No results found for this or any previous visit (from the past 240 hour(s)).  Please note: You were cared for by a hospitalist during your hospital stay. Once you are discharged, your primary care physician will handle any further medical issues. Please note that NO REFILLS for any discharge medications will be authorized once you are discharged, as it is imperative that you return to your primary care physician (or establish a relationship with a primary care physician if you do not have one) for your post hospital discharge needs so that they can reassess your need for medications and monitor your lab values.    Time coordinating discharge: 40 minutes  SIGNED:   Burnadette Pop, MD  [facility] 06/14/2018, 11:17 AM Pager [phone]  If 7PM-7AM, please contact night-coverage [email or web address] Password [email or web address]

## 2018-06-14 NOTE — Evaluation (Signed)
Speech Language Pathology Evaluation Patient Details Name: Jesus Lam MRN: [id number] DOB: 1932/11/30 Today's Date: 06/14/2018 Time: 6381-7711 SLP Time Calculation (min) (ACUTE ONLY): 15 min  Problem List:  Patient Active Problem List   Diagnosis Date Noted  . PAD (peripheral artery disease) (HCC) 06/13/2018  . Ischemic cerebrovascular accident (CVA) (HCC) 06/13/2018  . Altered mental status 06/12/2018  . Dysarthria 06/12/2018  . Renal insufficiency 06/12/2018  . Claudication (HCC) 01/11/2018  . Anemia 10/10/2017  . Cellulitis of leg, left 11/04/2013  . HTN (hypertension) 11/04/2013   Past Medical History:  Past Medical History:  Diagnosis Date  . BPH (benign prostatic hyperplasia)   . Cataracts, bilateral   . Dyslipidemia   . Gout   . HTN (hypertension)   . Osteoarthritis   . PAD (peripheral artery disease) (HCC)   . Stasis dermatitis   . Vitamin D deficiency    Past Surgical History:  Past Surgical History:  Procedure Laterality Date  . ABDOMINAL AORTOGRAM N/A 01/13/2018   Procedure: ABDOMINAL AORTOGRAM;  Surgeon: Elder Negus, MD;  Location: [facility];  Service: Cardiovascular;  Laterality: N/A;  . CATARACT EXTRACTION, BILATERAL    . LEFT HEART CATH AND CORONARY ANGIOGRAPHY N/A 01/13/2018   Procedure: LEFT HEART CATH AND CORONARY ANGIOGRAPHY;  Surgeon: Elder Negus, MD;  Location: [facility];  Service: Cardiovascular;  Laterality: N/A;  . LOWER EXTREMITY ANGIOGRAPHY N/A 01/13/2018   Procedure: LOWER EXTREMITY ANGIOGRAPHY;  Surgeon: Elder Negus, MD;  Location: [facility];  Service: Cardiovascular;  Laterality: N/A;  . LOWER EXTREMITY ANGIOGRAPHY Right 01/27/2018   Procedure: LOWER EXTREMITY ANGIOGRAPHY;  Surgeon: Yates Decamp, MD;  Location: [facility];  Service: Cardiovascular;  Laterality: Right;  . PERIPHERAL VASCULAR ATHERECTOMY Left 01/13/2018   Procedure: PERIPHERAL VASCULAR ATHERECTOMY;  Surgeon: Elder Negus, MD;  Location: [facility];  Service: Cardiovascular;  Laterality: Left;  SFA WITH PTA DRUG COATED BALLOON  . PERIPHERAL VASCULAR INTERVENTION  01/27/2018   Procedure: PERIPHERAL VASCULAR INTERVENTION;  Surgeon: Yates Decamp, MD;  Location: [facility];  Service: Cardiovascular;;   HPI:  Patient is a 84 y/o male presenting with AMS, dysarthria, and R visual impairments. Patient with a PMH significant for hypertension, PAD. No significant abnormality was seen on CT head. MRI revealing Acute ischemic nonhemorrhagic left temporo-occipital cortical infarct.   Assessment / Plan / Recommendation Clinical Impression   Patient presents with moderate aphasia most consistent with conduction type, with deficits in both auditory comprehension and verbal expression. Auditory comprehension is good for simple, one-step commands, however pt has difficulty with increasing length, complexity (visual cues required to complete all steps). Speech is relatively fluent, though with significant word-finding difficulty and paraphasias noted. Confrontation naming 0% accuracy; pt with consistent perseveration "store" with awareness. Repetition is impaired at sentence level. Will require skilled ST services in next level of care to maximize functional communication and improve quality of life. Recommend HH SLP; will follow acutely for education and training in multimodal communication strategies.     SLP Assessment  SLP Recommendation/Assessment: Patient needs continued Speech Lanaguage Pathology Services SLP Visit Diagnosis: Aphasia (R47.01)    Follow Up Recommendations  Home health SLP    Frequency and Duration min 1 x/week  1 week      SLP Evaluation Cognition  Overall Cognitive Status: Difficult to assess Arousal/Alertness: Awake/alert Attention: Sustained Sustained Attention: Appears intact       Comprehension  Auditory Comprehension Overall Auditory  Comprehension: Impaired Yes/No  Questions: Impaired Basic Biographical Questions: 76-100% accurate Basic Immediate Environment Questions: 75-100% accurate Complex Questions: 50-74% accurate(50%) Commands: Impaired One Step Basic Commands: 75-100% accurate(90%) Two Step Basic Commands: 50-74% accurate(completes only 1 step, consistently) Complex Commands: 0-24% accurate Conversation: Simple Reading Comprehension Reading Status: Not tested    Expression Expression Primary Mode of Expression: Verbal Verbal Expression Overall Verbal Expression: Impaired Initiation: No impairment Automatic Speech: Name;Social Response Level of Generative/Spontaneous Verbalization: Conversation Repetition: Impaired Level of Impairment: Sentence level Naming: Impairment Confrontation: Impaired(0%, "store" perseveration) Verbal Errors: Semantic paraphasias;Phonemic paraphasias;Aware of errors Pragmatics: No impairment Written Expression Dominant Hand: Right Written Expression: Not tested   Oral / Motor  Oral Motor/Sensory Function Overall Oral Motor/Sensory Function: Within functional limits Motor Speech Overall Motor Speech: Appears within functional limits for tasks assessed   GO                   Jesus Baton, MS, CCC-SLP Speech-Language Pathologist [phone]  Jesus Lam 06/14/2018, 1:22 PM

## 2018-06-14 NOTE — Progress Notes (Signed)
PROGRESS NOTE    Jesus Lam  LPF:[id number] DOB: [birth year] DOA: 06/12/2018 PCP: Lindaann Pascal, PA-C   Brief Narrative: Patient is 84 year old male with past medical history of hypertension, peripheral artery disease who was brought to the emergency department for altered mental status and dysarthria.  Patient also complained of difficulty with vision on his right eye.  Stroke was suspected on presentation.  MRI/MRI of the brain showed Acute ischemic nonhemorrhagic left temporo-occipital cortical infarct. Neurology following.  Echocardiogram showed reduced ejection fraction of 25 to 30%, diffuse hypokinesis, grade 1 diastolic dysfunction.  Cardiology consulted.  Assessment & Plan:   Principal Problem:   Ischemic cerebrovascular accident (CVA) (HCC) Active Problems:   HTN (hypertension)   Anemia   Altered mental status   Dysarthria   Renal insufficiency   PAD (peripheral artery disease) (HCC)  Acute ischemic stroke:  MRI/MRI of the brain showed Acute ischemic nonhemorrhagic left temporo-occipital cortical infarct. Neurology following.  Underwent echocardiogram. and carotid Doppler studies.  Carotid Doppler did not show any significant stenosis.  PT/OT recommending HHPT. Awaiting speech evaluation.  Currently on aspirin and Plavix.  Continue statin. Hemoglobin A1c of 5.3 and lipid panel shows LDL of 86.  Combined systolic and diastolic heart failure: Echocardiogram showed ejection fraction of 25 to 30%, diffuse hypokinesis, grade 1 diastolic dysfunction.  He follows with cardiology Dr. Jacinto Halim .Cardiac catheterization on 2/19 showed mild nonobstructive coronary artery disease. Dr. Jacinto Halim has been notified and he will follow the patient here. He is on Lasix at home which is on hold due to AKI.  Patient is not volume overloaded.  He is already on beta-blockers.  Patient might be a candidate to be started on Entresto .We will follow-up Cardiology recommendation   Altered mental  status/dysarthria:Resolved.  Currently he is on baseline.  AKI: Baseline kidney function normal.  Slight improvement today.  Hypertension: Continue current medications.  Currently blood pressure stable.  History of peripheral artery disease: Continue aspirin, Plavix and statin.Follows with cardiology.  Neuropathy: Continue gabapentin.  Marijuana abuse: Counseling provided for cessation    DVT prophylaxis:SCD Code Status: Full Family Communication: Daughter present at the bedside Disposition Plan: HHPT after clearance by neurology and cardiology  Consultants: Neurology  Procedures: None  Antimicrobials: None  Subjective:  Patient seen and examined the bedside this morning.  Currently he is alert and oriented.  Speech is clear.  No focal deficits on neurological examination.  Does not complain of any chest pain or shortness of breath.  No peripheral edema. Objective: Vitals:   06/13/18 2010 06/13/18 2329 06/14/18 0500 06/14/18 0735  BP: 130/75 123/67 134/65 134/70  Pulse:  67 (!) 59 61  Resp:   20 18  Temp:  98.9 F (37.2 C) 98.8 F (37.1 C) 98.2 F (36.8 C)  TempSrc:  Oral Oral Oral  SpO2: 97% 97%  98%  Weight:      Height:        Intake/Output Summary (Last 24 hours) at 06/14/2018 0846 Last data filed at 06/14/2018 0500 Gross per 24 hour  Intake 224.22 ml  Output 850 ml  Net -625.78 ml   Filed Weights   06/13/18 0020  Weight: 102.4 kg (225 lb 12 oz)    Examination:  General exam: Appears calm and comfortable ,Not in distress,average built HEENT:PERRL,Oral mucosa dry, Ear/Nose normal on gross exam Respiratory system: Bilateral equal air entry, normal vesicular breath sounds, no wheezes or crackles  Cardiovascular system: S1 & S2 heard, RRR. No JVD, murmurs, rubs, gallops or clicks.  No pedal edema. Gastrointestinal system: Abdomen is nondistended, soft and nontender. No organomegaly or masses felt. Normal bowel sounds heard. Central nervous system: Alert and  oriented. No focal neurological deficits. Extremities: No edema, no clubbing ,no cyanosis, distal peripheral pulses palpable. Skin: No rashes, lesions or ulcers,no icterus ,no pallor MSK: Normal muscle bulk,tone ,power Psychiatry: Judgement and insight appear normal. Mood & affect appropriate.     Data Reviewed: I have personally reviewed following labs and imaging studies  CBC: Recent Labs  Lab 06/12/18 2049 06/12/18 2100  WBC 6.6  --   NEUTROABS 3.9  --   HGB 11.7* 11.9*  HCT 36.4* 35.0*  MCV 90.3  --   PLT 177  --    Basic Metabolic Panel: Recent Labs  Lab 06/12/18 2049 06/12/18 2100 06/14/18 0255  NA 142 141 142  K 4.0 3.9 3.8  CL 108 106 107  CO2 26  --  28  GLUCOSE 105* 102* 109*  BUN 20 18 16   CREATININE 1.30* 1.40* 1.26*  CALCIUM 9.5  --  9.6   GFR: Estimated Creatinine Clearance: 55 mL/min (A) (by C-G formula based on SCr of 1.26 mg/dL (H)). Liver Function Tests: Recent Labs  Lab 06/12/18 2049  AST 25  ALT 21  ALKPHOS 76  BILITOT 0.7  PROT 8.0  ALBUMIN 3.6   No results for input(s): LIPASE, AMYLASE in the last 168 hours. No results for input(s): AMMONIA in the last 168 hours. Coagulation Profile: Recent Labs  Lab 06/12/18 2049  INR 1.04   Cardiac Enzymes: No results for input(s): CKTOTAL, CKMB, CKMBINDEX, TROPONINI in the last 168 hours. BNP (last 3 results) No results for input(s): PROBNP in the last 8760 hours. HbA1C: Recent Labs    06/13/18 1018  HGBA1C 5.3   CBG: No results for input(s): GLUCAP in the last 168 hours. Lipid Profile: Recent Labs    06/13/18 1018  CHOL 135  HDL 31*  LDLCALC 86  TRIG 92  CHOLHDL 4.4   Thyroid Function Tests: No results for input(s): TSH, T4TOTAL, FREET4, T3FREE, THYROIDAB in the last 72 hours. Anemia Panel: No results for input(s): VITAMINB12, FOLATE, FERRITIN, TIBC, IRON, RETICCTPCT in the last 72 hours. Sepsis Labs: No results for input(s): PROCALCITON, LATICACIDVEN in the last 168  hours.  No results found for this or any previous visit (from the past 240 hour(s)).       Radiology Studies: Dg Chest 2 View  Result Date: 06/12/2018 CLINICAL DATA:  Altered mental status EXAM: CHEST - 2 VIEW COMPARISON:  None. FINDINGS: Cardiomegaly with mild uncoiling of the thoracic aorta. Minimal aortic atherosclerosis is noted. Mild central vascular congestion is seen without pulmonary consolidation. No effusion or pneumothorax. No acute osseous abnormality. IMPRESSION: Cardiomegaly with tortuous atherosclerotic aorta. Mild vascular congestion. Electronically Signed   By: Tollie Eth M.D.   On: 06/12/2018 23:25   Ct Head Wo Contrast  Result Date: 06/12/2018 CLINICAL DATA:  Confusion.  Altered mental status. EXAM: CT HEAD WITHOUT CONTRAST TECHNIQUE: Contiguous axial images were obtained from the base of the skull through the vertex without intravenous contrast. COMPARISON:  None. FINDINGS: Brain: The brainstem, cerebellum, cerebral peduncles, thalami, basal ganglia, basilar cisterns, and ventricular system appear within normal limits. No intracranial hemorrhage, mass lesion, or acute CVA. Vascular: Unremarkable Skull: Unremarkable Sinuses/Orbits: Unremarkable Other: No supplemental non-categorized findings. IMPRESSION: 1.  No significant abnormality identified. Electronically Signed   By: Gaylyn Rong M.D.   On: 06/12/2018 21:10   Mr Brain Wo Contrast  Result Date: 06/13/2018 CLINICAL DATA:  Initial evaluation for acute altered mental status, possible stroke. EXAM: MRI HEAD WITHOUT CONTRAST MRA HEAD WITHOUT CONTRAST TECHNIQUE: Multiplanar, multiecho pulse sequences of the brain and surrounding structures were obtained without intravenous contrast. Angiographic images of the head were obtained using MRA technique without contrast. COMPARISON:  Prior CT from 06/12/2018 FINDINGS: MRI HEAD FINDINGS Brain: Examination mildly degraded by motion artifact. Generalized age-related cerebral  atrophy. Minimal T2/FLAIR hyperintensity within the periventricular deep white matter both cerebral hemispheres, most consistent with chronic small vessel ischemic disease, mild for age. Confluent restricted diffusion involving the cortical gray matter of the left temporal occipital region, consistent with acute ischemic infarct (series 5, image 60). Area involves posterior left MCA territory and/or left MCA/PCA watershed zone. No associated hemorrhage or mass effect. No other evidence for acute or subacute ischemia. Gray-white matter differentiation otherwise maintained. No other areas of remote cortical infarction. No acute or chronic intracranial hemorrhage. No mass lesion, midline shift or mass effect. No hydrocephalus. No extra-axial fluid collection. Pituitary gland normal. Vascular: Major intracranial vascular flow voids maintained Skull and upper cervical spine: Craniocervical junction normal. Upper cervical spine within normal limits. Bone marrow signal intensity within normal limits. No scalp soft tissue abnormality. Sinuses/Orbits: Globes and orbital soft tissues within normal limits. Patient status post ocular lens replacement bilaterally. Mild scattered mucosal thickening within the ethmoidal air cells, chronic in appearance. Paranasal sinuses are otherwise clear. No mastoid effusion. Inner ear structures normal. Other: None. MRA HEAD FINDINGS ANTERIOR CIRCULATION: Examination moderately degraded by motion artifact. Distal cervical segments of the internal carotid arteries are patent with antegrade flow. Petrous, cavernous, and supraclinoid segments patent without hemodynamically significant stenosis. A1 segments patent bilaterally. Anterior communicating artery not well assessed on this motion degraded exam. Anterior cerebral arteries patent to their distal aspects without obvious stenosis. Widely patent M1 segments. Distal MCA branches well perfused and symmetric. Distal small vessel atheromatous  irregularity. POSTERIOR CIRCULATION: Vertebral arteries patent to the vertebrobasilar junction without significant stenosis. Left vertebral artery slightly dominant. Posterior inferior cerebral arteries patent proximally. Basilar patent to its distal aspect without stenosis. Superior cerebral arteries patent bilaterally. Both of the posterior cerebral artery supplied via the basilar artery. Atheromatous irregularity within the PCAs bilaterally without high-grade stenosis. PCAs are patent to their distal aspects. Small left posterior communicating artery noted. No aneurysm. IMPRESSION: MRI HEAD IMPRESSION: 1. Acute ischemic nonhemorrhagic left temporo-occipital cortical infarct. 2. Age-related cerebral atrophy with mild chronic small vessel ischemic disease. MRA HEAD IMPRESSION: 1. Negative intracranial MRA with no large vessel occlusion identified. No proximal high-grade or correctable stenosis. 2. Minor atherosclerotic change for age. Electronically Signed   By: Rise Mu M.D.   On: 06/13/2018 07:57   Mr Shirlee Latch NL Contrast  Result Date: 06/13/2018 CLINICAL DATA:  Initial evaluation for acute altered mental status, possible stroke. EXAM: MRI HEAD WITHOUT CONTRAST MRA HEAD WITHOUT CONTRAST TECHNIQUE: Multiplanar, multiecho pulse sequences of the brain and surrounding structures were obtained without intravenous contrast. Angiographic images of the head were obtained using MRA technique without contrast. COMPARISON:  Prior CT from 06/12/2018 FINDINGS: MRI HEAD FINDINGS Brain: Examination mildly degraded by motion artifact. Generalized age-related cerebral atrophy. Minimal T2/FLAIR hyperintensity within the periventricular deep white matter both cerebral hemispheres, most consistent with chronic small vessel ischemic disease, mild for age. Confluent restricted diffusion involving the cortical gray matter of the left temporal occipital region, consistent with acute ischemic infarct (series 5, image 60).  Area involves posterior left MCA territory and/or  left MCA/PCA watershed zone. No associated hemorrhage or mass effect. No other evidence for acute or subacute ischemia. Gray-white matter differentiation otherwise maintained. No other areas of remote cortical infarction. No acute or chronic intracranial hemorrhage. No mass lesion, midline shift or mass effect. No hydrocephalus. No extra-axial fluid collection. Pituitary gland normal. Vascular: Major intracranial vascular flow voids maintained Skull and upper cervical spine: Craniocervical junction normal. Upper cervical spine within normal limits. Bone marrow signal intensity within normal limits. No scalp soft tissue abnormality. Sinuses/Orbits: Globes and orbital soft tissues within normal limits. Patient status post ocular lens replacement bilaterally. Mild scattered mucosal thickening within the ethmoidal air cells, chronic in appearance. Paranasal sinuses are otherwise clear. No mastoid effusion. Inner ear structures normal. Other: None. MRA HEAD FINDINGS ANTERIOR CIRCULATION: Examination moderately degraded by motion artifact. Distal cervical segments of the internal carotid arteries are patent with antegrade flow. Petrous, cavernous, and supraclinoid segments patent without hemodynamically significant stenosis. A1 segments patent bilaterally. Anterior communicating artery not well assessed on this motion degraded exam. Anterior cerebral arteries patent to their distal aspects without obvious stenosis. Widely patent M1 segments. Distal MCA branches well perfused and symmetric. Distal small vessel atheromatous irregularity. POSTERIOR CIRCULATION: Vertebral arteries patent to the vertebrobasilar junction without significant stenosis. Left vertebral artery slightly dominant. Posterior inferior cerebral arteries patent proximally. Basilar patent to its distal aspect without stenosis. Superior cerebral arteries patent bilaterally. Both of the posterior cerebral  artery supplied via the basilar artery. Atheromatous irregularity within the PCAs bilaterally without high-grade stenosis. PCAs are patent to their distal aspects. Small left posterior communicating artery noted. No aneurysm. IMPRESSION: MRI HEAD IMPRESSION: 1. Acute ischemic nonhemorrhagic left temporo-occipital cortical infarct. 2. Age-related cerebral atrophy with mild chronic small vessel ischemic disease. MRA HEAD IMPRESSION: 1. Negative intracranial MRA with no large vessel occlusion identified. No proximal high-grade or correctable stenosis. 2. Minor atherosclerotic change for age. Electronically Signed   By: Rise Mu M.D.   On: 06/13/2018 07:57        Scheduled Meds: . amLODipine  5 mg Oral Daily  . aspirin  300 mg Rectal Daily   Or  . aspirin  325 mg Oral Daily  . atorvastatin  80 mg Oral Daily  . chlorthalidone  12.5 mg Oral Daily  . cholecalciferol  5,000 Units Oral Daily  . clopidogrel  75 mg Oral Q breakfast  . gabapentin  300 mg Oral QHS  . metoprolol succinate  25 mg Oral Daily  . multivitamin with minerals  1 tablet Oral Daily  . tamsulosin  0.4 mg Oral QPC supper  . vitamin C  500 mg Oral Daily   Continuous Infusions:    LOS: 2 days    Time spent: 25 mins.More than 50% of that time was spent in counseling and/or coordination of care.      Burnadette Pop, MD [facility] Pager [phone]  If 7PM-7AM, please contact night-coverage [email or web address] Password [facility] 06/14/2018, 8:46 AM

## 2018-06-14 NOTE — Consult Note (Signed)
CARDIOLOGY CONSULT NOTE  Patient ID: Jesus Lam MRN: [id number] DOB/AGE: 82-May-1934 82 y.o.  Admit date: 06/12/2018 Referring Physician  Burnadette Pop, MD Primary Physician:  Lindaann Pascal, PA-C Reason for Consultation  Cardiomyopathy  HPI: Jesus Lam  is a 84 y.o. male  With history of hypertension, hyperlipidemia, peripheral arterial disease with history of See summary ordered lumbar MRI right SFA angioplasty and stenting on 01/27/2018 and left SFA angioplasty and atherectomy followed by balloon angioplasty on 01/13/2018, he has also had cardiac catheterization at that time revealing nonischemic cardiomyopathy for a ejection fraction of 45% by echocardiogram in the past.  He has been doing well with mild symptoms of claudication in his calves bilaterally, but on Friday afternoon his family noticed dysarthria and also in the evening his daughter noticed that he was slightly confused and dysarthric hence brought him to the emergency room where he was diagnosed with left temporal occipital cortical infarct.  Echocardiogram performed revealed severe LV systolic dysfunction.  I was consulted to manage his cardiomyopathy.  Patient has no history of atrial fibrillation, no significant coronary artery disease.  He does not drink excessive alcohol, does use marijuana but does not use any tobacco.  He is presently feeling better, dysarthria is improved and he is not any confused since being in the hospital.  Denies PND or orthopnea, denies chest pain or palpitations, no history of syncope.  Past Medical History:  Diagnosis Date  . BPH (benign prostatic hyperplasia)   . Cataracts, bilateral   . Dyslipidemia   . Gout   . HTN (hypertension)   . Osteoarthritis   . PAD (peripheral artery disease) (HCC)   . Stasis dermatitis   . Vitamin D deficiency      Past Surgical History:  Procedure Laterality Date  . ABDOMINAL AORTOGRAM N/A 01/13/2018   Procedure: ABDOMINAL AORTOGRAM;  Surgeon: Elder Negus, MD;  Location: [facility];  Service: Cardiovascular;  Laterality: N/A;  . CATARACT EXTRACTION, BILATERAL    . LEFT HEART CATH AND CORONARY ANGIOGRAPHY N/A 01/13/2018   Procedure: LEFT HEART CATH AND CORONARY ANGIOGRAPHY;  Surgeon: Elder Negus, MD;  Location: [facility];  Service: Cardiovascular;  Laterality: N/A;  . LOWER EXTREMITY ANGIOGRAPHY N/A 01/13/2018   Procedure: LOWER EXTREMITY ANGIOGRAPHY;  Surgeon: Elder Negus, MD;  Location: [facility];  Service: Cardiovascular;  Laterality: N/A;  . LOWER EXTREMITY ANGIOGRAPHY Right 01/27/2018   Procedure: LOWER EXTREMITY ANGIOGRAPHY;  Surgeon: Yates Decamp, MD;  Location: [facility];  Service: Cardiovascular;  Laterality: Right;  . PERIPHERAL VASCULAR ATHERECTOMY Left 01/13/2018   Procedure: PERIPHERAL VASCULAR ATHERECTOMY;  Surgeon: Elder Negus, MD;  Location: [facility];  Service: Cardiovascular;  Laterality: Left;  SFA WITH PTA DRUG COATED BALLOON  . PERIPHERAL VASCULAR INTERVENTION  01/27/2018   Procedure: PERIPHERAL VASCULAR INTERVENTION;  Surgeon: Yates Decamp, MD;  Location: [facility];  Service: Cardiovascular;;     Family History  Problem Relation Age of Onset  . Cancer Mother   . Cancer Father   . Alcohol abuse Father   . Breast cancer Daughter   . CVA Daughter      Social History: Social History   Socioeconomic History  . Marital status: Single    Spouse name: Not on file  . Number of children: Not on file  . Years of education: Not on file  . Highest education level: Not on file  Occupational History  . Not on file  Social Needs  . Financial resource strain: Not on file  . Food insecurity:    Worry: Not on file    Inability: Not on file  . Transportation needs:    Medical: Not on file    Non-medical: Not on file  Tobacco Use  . Smoking status: Never Smoker  . Smokeless tobacco: Never Used  Substance and Sexual Activity  . Alcohol use: No     Frequency: Never    Comment: occasionally  . Drug use: Yes    Types: Marijuana    Comment: 1 oz. per week  . Sexual activity: Not on file  Lifestyle  . Physical activity:    Days per week: Not on file    Minutes per session: Not on file  . Stress: Not on file  Relationships  . Social connections:    Talks on phone: Not on file    Gets together: Not on file    Attends religious service: Not on file    Active member of club or organization: Not on file    Attends meetings of clubs or organizations: Not on file    Relationship status: Not on file  . Intimate partner violence:    Fear of current or ex partner: Not on file    Emotionally abused: Not on file    Physically abused: Not on file    Forced sexual activity: Not on file  Other Topics Concern  . Not on file  Social History Narrative   Lives alone. 2 daughters live here in [place]. Son lives in [place].     Medications Prior to Admission  Medication Sig Dispense Refill Last Dose  . amLODipine (NORVASC) 5 MG tablet Take 1 tablet (5 mg total) by mouth daily. 30 tablet 1 06/12/2018 at Unknown time  . aspirin EC 81 MG tablet Take 81 mg by mouth daily.   06/12/2018 at Unknown time  . atorvastatin (LIPITOR) 80 MG tablet Take 80 mg by mouth daily.   06/12/2018 at Unknown time  . chlorthalidone (HYGROTON) 25 MG tablet Take 12.5 mg by mouth daily.  2 06/12/2018 at Unknown time  . Cholecalciferol (VITAMIN D3) 5000 units CAPS Take 5,000 Units by mouth daily.   06/12/2018 at Unknown time  . clopidogrel (PLAVIX) 75 MG tablet Take 1 tablet (75 mg total) by mouth daily with breakfast. 60 tablet 3 06/11/2018 at Unknown time  . colchicine 0.6 MG tablet Take 1 tab daily until 48 hours after resolution of symptoms, or up to 7 days. 7 tablet 0 06/12/2018 at Unknown time  . furosemide (LASIX) 20 MG tablet Take 20 mg by mouth daily.  0 06/12/2018 at Unknown time  . gabapentin (NEURONTIN) 300 MG capsule Take 300 mg by mouth at bedtime.    06/11/2018 at Unknown time  . metoprolol succinate (TOPROL-XL) 25 MG 24 hr tablet Take 25 mg by mouth daily.  0 06/12/2018 at 0830  . Multiple Vitamin (MULTIVITAMIN) capsule Take 1 capsule daily by mouth.   06/12/2018 at Unknown time  . tamsulosin (FLOMAX) 0.4 MG CAPS capsule Take 1 capsule (0.4 mg total) by mouth daily after supper. 30 capsule 1 06/12/2018 at Unknown time  . vitamin C (ASCORBIC ACID) 500 MG tablet Take 500 mg daily by mouth.   06/12/2018 at Unknown time   Review of Systems  Constitutional: Negative.   HENT: Negative.   Eyes: Negative.   Respiratory: Negative.   Cardiovascular: Positive for claudication. Negative for chest pain, palpitations and orthopnea.  Gastrointestinal:  Negative.   Musculoskeletal: Positive for joint pain. Negative for falls.  Skin: Negative.   Neurological:       Dysarthria present.  No focal deficits otherwise.  Grossly normal otherwise.  Psychiatric/Behavioral: Negative.   All other systems reviewed and are negative.   Physical Exam: Blood pressure 134/70, pulse 61, temperature 98.2 F (36.8 C), temperature source Oral, resp. rate 18, height 6' (1.829 m), weight 102.4 kg (225 lb 12 oz), SpO2 98 %.   Physical Exam  Constitutional: He is oriented to person, place, and time. He appears well-developed and well-nourished. No distress.  HENT:  Head: Normocephalic and atraumatic.  Eyes: Conjunctivae are normal. Right eye exhibits no discharge. No scleral icterus.  Neck: Neck supple. No JVD present. No thyromegaly present.  Cardiovascular: Normal rate, regular rhythm and normal heart sounds. Exam reveals no gallop and no friction rub.  No murmur heard. Carotids normal pulse with soft bruit, bilateral femorals are normal with faint bruit, popliteal pulses are 2+ bilaterally.  Right DP and PT 1+, left faint.  Respiratory: Effort normal and breath sounds normal.  GI: Soft. Bowel sounds are normal.  Musculoskeletal: Normal range of motion. He exhibits no  edema, tenderness or deformity.  Neurological: He is alert and oriented to person, place, and time.  Dysarthria noted.  Skin: Skin is warm and dry. He is not diaphoretic.  Psychiatric: He has a normal mood and affect.   Labs:   Lab Results  Component Value Date   WBC 6.6 06/12/2018   HGB 11.9 (L) 06/12/2018   HCT 35.0 (L) 06/12/2018   MCV 90.3 06/12/2018   PLT 177 06/12/2018    Recent Labs  Lab 06/12/18 2049  06/14/18 0255  NA 142   < > 142  K 4.0   < > 3.8  CL 108   < > 107  CO2 26  --  28  BUN 20   < > 16  CREATININE 1.30*   < > 1.26*  CALCIUM 9.5  --  9.6  PROT 8.0  --   --   BILITOT 0.7  --   --   ALKPHOS 76  --   --   ALT 21  --   --   AST 25  --   --   GLUCOSE 105*   < > 109*   < > = values in this interval not displayed.    Lipid Panel     Component Value Date/Time   CHOL 135 06/13/2018 1018   TRIG 92 06/13/2018 1018   HDL 31 (L) 06/13/2018 1018   CHOLHDL 4.4 06/13/2018 1018   VLDL 18 06/13/2018 1018   LDLCALC 86 06/13/2018 1018   HEMOGLOBIN A1C Lab Results  Component Value Date   HGBA1C 5.3 06/13/2018   MPG 105.41 06/13/2018    Radiology:  Chest x-ray 06/12/2018: Cardiomegaly with mild uncoiling of the thoracic aorta. Minimal aortic atherosclerosis is noted. Mild central vascular congestion is seen without pulmonary consolidation. No effusion or pneumothorax. No acute osseous abnormality. IMPRESSION: Cardiomegaly with tortuous atherosclerotic aorta. Mild vascular congestion. Electronically Signed   By: Tollie Eth M.D.   On: 06/12/2018 23:25   MRA of the head and brain: 06/13/2018:  IMPRESSION: MRI HEAD IMPRESSION: 1. Acute ischemic nonhemorrhagic left temporo-occipital cortical infarct. 2. Age-related cerebral atrophy with mild chronic small vessel ischemic disease. MRA HEAD IMPRESSION: 1. Negative intracranial MRA with no large vessel occlusion identified. No proximal high-grade or correctable stenosis. 2. Minor atherosclerotic change for age.  Electronically Signed   By: Rise Mu M.D.   On: 06/13/2018 07:57   Scheduled Meds: . amLODipine  5 mg Oral Daily  . aspirin  300 mg Rectal Daily   Or  . aspirin  325 mg Oral Daily  . atorvastatin  80 mg Oral Daily  . chlorthalidone  12.5 mg Oral Daily  . cholecalciferol  5,000 Units Oral Daily  . clopidogrel  75 mg Oral Q breakfast  . gabapentin  300 mg Oral QHS  . metoprolol succinate  25 mg Oral Daily  . multivitamin with minerals  1 tablet Oral Daily  . tamsulosin  0.4 mg Oral QPC supper  . vitamin C  500 mg Oral Daily   Continuous Infusions: PRN Meds:.acetaminophen **OR** acetaminophen (TYLENOL) oral liquid 160 mg/5 mL **OR** acetaminophen  CARDIAC STUDIES:  EKG 06/12/2018: Sinus rhythm with first-degree AV block at rate of 82 bpm, left axis deviation, left anterior fascicular block.  LVH.  PVC.  Echocardiogram 06/13/2018: Left ventricle is mildly dilated, mild to moderate LVH, systolic function severely depressed at 25 to 30% with diffuse hypokinesis.  Grade 1 diastolic dysfunction.  Mild AI, moderate LA enlargement, mild right atrial enlargement.  Compared to 11/20/2017, EF has decreased from 45%.  Nuclear stress test  12/29/2017: 1. Resting EKG demonstrates normal sinus rhythm, left axis deviation, LVH. Stress EKG is non-diagnostic for ischemia as it a pharmacologic stress using Lexiscan. Stress symptoms included dizziness. 2. The LV is dilated both at rest and stress images. The LV end diastolic volume was 262 mL. There is a large area of infarction in the basal inferior, basal inferolateral, mid inferior, mid inferolateral, apical inferior and apical lateral myocardial wall(s). Gated SPECT imaging demonstrates hypokinesis of the inferior and lateral myocardial wall(s). The left ventricular ejection fraction was calculated or visually estimated to be 46%. This is an intermediate risk study, clinical correlation recommended.  Peripheral angiogram 01/13/2018 and left  heart catheterization same day: Mild nonobstructive coronary artery disease Left distal SFA subtotal occlusion treated successfully with directional atherectomy and 6.0 X 120 mm In.Pact Admiral drug coated balloon PTA. 0% residual stenosis in distal SFA with excellent flow. 2 vessel runoff below the knee with proximally occluded left antetior tibial artery.  Peripheral angiogram 01/27/2018: Right mid and distal SFA CTO, moderate calcification evident. Successful PTA and stenting of the CTO right mid and distal SFA with antegrade left femoral and retrograde right PT access and implantation of 2 overlapping 6.0 x 1 20 mm Zilver PTX drug-coated stents. Stenosis reduced from 100% to 0%.   ASSESSMENT AND PLAN:  1.  Ischemic stroke with mild residual defect in the form of dysarthria 2.  Nonischemic dilated cardiomyopathy with severe LV systolic dysfunction. 3.  Peripheral arterial disease with symptoms of claudication, mild.  History of prior percutaneous revascularization as dictated above. 4.  Hypertension 5.  Hyperlipidemia 6.  Acute kidney failure/kidney injury secondary to dehydration, stage III.  Improving.  Recommendation: From cardiac standpoint he is very well compensated.  Due to acute renal insufficiency when he presented, probably related to dehydration and patient being on furosemide, I have recommended that we start him on BiDil 1 p.o. 3 times daily and continue beta-blocker for now.  I will do 30-day event monitor in the outpatient basis, this will be set up tomorrow.  With regard to antiplatelet therapy versus anticoagulation, I will leave it for neurology to decide.  Patient previously on aspirin and Plavix prior to hospital admission.  Thank you for the  consultation.  Yates Decamp, MD 06/14/2018, 9:17 AM [facility]. [place] Pager: [phone] Office: [phone] If no answer Cell [phone]

## 2018-06-14 NOTE — Progress Notes (Signed)
STROKE TEAM PROGRESS NOTE    SUBJECTIVE (INTERVAL HISTORY) His family is not  at the bedside.   He remains aphasic but is able to communicate with difficulty. MRI shows an embolic left MCA infarct. Echocardiogram shows decreased EF 25%. Cardiology consult appreciated .      OBJECTIVE Temp:  [98.2 F (36.8 C)-98.9 F (37.2 C)] 98.2 F (36.8 C) (07/21 0735) Pulse Rate:  [59-67] 67 (07/21 0930) Cardiac Rhythm: Heart block (07/21 0700) Resp:  [18-20] 18 (07/21 0735) BP: (110-134)/(64-78) 110/64 (07/21 0930) SpO2:  [97 %-98 %] 98 % (07/21 0735)  CBC:  Recent Labs  Lab 06/12/18 2049 06/12/18 2100  WBC 6.6  --   NEUTROABS 3.9  --   HGB 11.7* 11.9*  HCT 36.4* 35.0*  MCV 90.3  --   PLT 177  --     Basic Metabolic Panel:  Recent Labs  Lab 06/12/18 2049 06/12/18 2100 06/14/18 0255  NA 142 141 142  K 4.0 3.9 3.8  CL 108 106 107  CO2 26  --  28  GLUCOSE 105* 102* 109*  BUN 20 18 16   CREATININE 1.30* 1.40* 1.26*  CALCIUM 9.5  --  9.6    Lipid Panel:     Component Value Date/Time   CHOL 135 06/13/2018 1018   TRIG 92 06/13/2018 1018   HDL 31 (L) 06/13/2018 1018   CHOLHDL 4.4 06/13/2018 1018   VLDL 18 06/13/2018 1018   LDLCALC 86 06/13/2018 1018   HgbA1c:  Lab Results  Component Value Date   HGBA1C 5.3 06/13/2018   Urine Drug Screen:     Component Value Date/Time   LABOPIA NONE DETECTED 06/12/2018 2123   COCAINSCRNUR NONE DETECTED 06/12/2018 2123   LABBENZ NONE DETECTED 06/12/2018 2123   AMPHETMU NONE DETECTED 06/12/2018 2123   THCU POSITIVE (A) 06/12/2018 2123   LABBARB NONE DETECTED 06/12/2018 2123    Alcohol Level     Component Value Date/Time   ETH <10 06/12/2018 2049    IMAGING   Dg Chest 2 View 06/12/2018 IMPRESSION:  Cardiomegaly with tortuous atherosclerotic aorta. Mild vascular congestion.    Ct Head Wo Contrast 06/12/2018 IMPRESSION:  No significant abnormality identified.     Mr 06/14/2018 Head Wo Contrast 06/13/2018 IMPRESSION:   MRI  HEAD  1. Acute ischemic nonhemorrhagic left temporo-occipital cortical infarct.  2. Age-related cerebral atrophy with mild chronic small vessel ischemic disease.   MRA HEAD  1. Negative intracranial MRA with no large vessel occlusion identified. No proximal high-grade or correctable stenosis.  2. Minor atherosclerotic change for age.     Transthoracic Echocardiogram - Left ventricle: The cavity size was mildly dilated. Wall   thickness was increased increased in a pattern of mild to   moderate LVH. Systolic function was severely reduced. The   estimated ejection fraction was in the range of 25% to 30%.   Diffuse hypokinesis.   Bilateral Carotid Dopplers - Right Carotid: The extracranial vessels were near-normal with only minimal wall thickening or plaque.  Left Carotid: The extracranial vessels were near-normal with only minimal wallthickening or plaque.  Vertebrals: Bilateral vertebral arteries demonstrate antegrade flow    PHYSICAL EXAM Vitals:   06/13/18 2329 06/14/18 0500 06/14/18 0735 06/14/18 0930  BP: 123/67 134/65 134/70 110/64  Pulse: 67 (!) 59 61 67  Resp:  20 18   Temp: 98.9 F (37.2 C) 98.8 F (37.1 C) 98.2 F (36.8 C)   TempSrc: Oral Oral Oral   SpO2: 97%  98%  Weight:      Height:       Pleasant obese elderly Caucasian male currently not in distress. . Afebrile. Head is nontraumatic. Neck is supple without bruit.    Cardiac exam no murmur or gallop. Lungs are clear to auscultation. Distal pulses are well felt. Neurological Exam ;  Awake  Alert oriented x 2.  Moderate expressive aphasia and can speak short sentences. Good comprehension. Unable to name and repeat.eye movements full without nystagmus.fundi were not visualized. Vision acuity   appeara normal.decreased blink to threat on the right compared to the left. Hearing is normal. Palatal movements are normal. Face symmetric. Tongue midline. Normal strength, tone, reflexes and coordination. Normal  sensation. Gait deferred.  NIHSS   5      ASSESSMENT/PLAN Jesus Lam is a 84 y.o. male with history of peripheral artery disease, hypertension, and hyperlipidemia, presenting with confusion and speech difficulties. He did not receive IV t-PA due to late presentation.  Stroke: left temporo-occipital cortical infarct possibly embolic - source unknown.  Resultant expressive aphasia  CT head - No significant abnormality identified.   MRI head - Acute ischemic nonhemorrhagic left temporo-occipital cortical infarct.   MRA head - unremarkable  Carotid Doppler - b/l mild plaquing but no stenosis  2D Echo - EF 20-25% no clot  UDS - positive for marijuana  LDL - 86  HgbA1c - 5.3  VTE prophylaxis - SCDs Diet Order           Diet - low sodium heart healthy        Diet Heart Room service appropriate? Yes; Fluid consistency: Thin  Diet effective now          aspirin 81 mg daily and clopidogrel 75 mg daily prior to admission, now on aspirin 325 mg daily and clopidogrel 75 mg daily  Patient counseled to be compliant with his antithrombotic medications  Ongoing aggressive stroke risk factor management  Therapy recommendations:  pending  Disposition:  Pending  Hypertension  Stable . Permissive hypertension (OK if < 220/120) but gradually normalize in 5-7 days . Long-term BP goal normotensive  Hyperlipidemia  Lipid lowering medication PTA: Lipitor 80 mg daily  LDL 86, goal < 70  Current lipid lowering medication: Lipitor 80 mg daily  Continue statin at discharge   Other Stroke Risk Factors  Advanced age  Marijuana use  Obesity, Body mass index is 30.62 kg/m., recommend weight loss, diet and exercise as appropriate   Family hx stroke (daughter)   Other Active Problems  Creatinine - 1.4  Mild anemia  Shellfish allergy      Hospital day # 2  I have personally examined this patient, reviewed notes, independently viewed imaging studies,  participated in medical decision making and plan of care.ROS completed by me personally and pertinent positives fully documented  I have made any additions or clarifications directly to the above note. He presented with expressive aphasia due to left MCA embolic infarct.  Recommend outpatient TEE and loop recorder. Discharge patient on aspirin and Plavix for stroke prevention. Discuss with Dr. Nadara Eaton and Dr. Renford Dills. G.reater than 50% time during this 25 minute visit was spent on counseling and coordination of care about embolic stroke and answered questions.follow-up as an outpatient in the stroke clinic and 6 weeks or call earlier if necessary. Stroke team will sign off. Delia Heady, MD Medical Director [facility] Pager: [phone] 06/14/2018 12:27 PM   To contact Stroke Continuity provider, please refer to [email or web address]. After hours,  contact General Neurology

## 2018-06-19 NOTE — Progress Notes (Signed)
06/19/2018 at 11:16 am: Received a phone call to the unit that patient has not received the ordered [facility] services. Pt was set up with [facility], but they are not in network with [employer]. [facility] had sent the referral to [facility]. According to [name] (pts daughter) she was aware of the change in [facility] agencies but that [facility] had not received all the orders for the [facility] services. CM called Alvino Chapel with [facility] and she was going to check in and make sure the patient is staffed and receives the services that were ordered.  Per [name] patient is out of his Plavix. Pt did not inform anyone at d/c that he needed this medication refilled. CM encouraged [name] to call patients PCP or Cardiologist (Dr Jacinto Halim) to see if this medication could be called in for him. CM reiterated with the daughter the importance of the plavix and she voiced understanding.

## 2018-07-29 DIAGNOSIS — I10 Essential (primary) hypertension: Secondary | ICD-10-CM | POA: Diagnosis not present

## 2018-07-29 DIAGNOSIS — I70219 Atherosclerosis of native arteries of extremities with intermittent claudication, unspecified extremity: Secondary | ICD-10-CM | POA: Diagnosis not present

## 2018-07-29 DIAGNOSIS — Z8673 Personal history of transient ischemic attack (TIA), and cerebral infarction without residual deficits: Secondary | ICD-10-CM | POA: Diagnosis not present

## 2018-07-29 DIAGNOSIS — Z0189 Encounter for other specified special examinations: Secondary | ICD-10-CM | POA: Diagnosis not present

## 2018-07-29 DIAGNOSIS — R6889 Other general symptoms and signs: Secondary | ICD-10-CM | POA: Diagnosis not present

## 2018-07-30 DIAGNOSIS — M25562 Pain in left knee: Secondary | ICD-10-CM | POA: Diagnosis not present

## 2018-07-30 DIAGNOSIS — M1712 Unilateral primary osteoarthritis, left knee: Secondary | ICD-10-CM | POA: Diagnosis not present

## 2018-07-30 DIAGNOSIS — R6889 Other general symptoms and signs: Secondary | ICD-10-CM | POA: Diagnosis not present

## 2018-07-31 DIAGNOSIS — G629 Polyneuropathy, unspecified: Secondary | ICD-10-CM | POA: Diagnosis not present

## 2018-07-31 DIAGNOSIS — N400 Enlarged prostate without lower urinary tract symptoms: Secondary | ICD-10-CM | POA: Diagnosis not present

## 2018-07-31 DIAGNOSIS — E669 Obesity, unspecified: Secondary | ICD-10-CM | POA: Diagnosis not present

## 2018-07-31 DIAGNOSIS — H9190 Unspecified hearing loss, unspecified ear: Secondary | ICD-10-CM | POA: Diagnosis not present

## 2018-07-31 DIAGNOSIS — Z6831 Body mass index (BMI) 31.0-31.9, adult: Secondary | ICD-10-CM | POA: Diagnosis not present

## 2018-07-31 DIAGNOSIS — E785 Hyperlipidemia, unspecified: Secondary | ICD-10-CM | POA: Diagnosis not present

## 2018-07-31 DIAGNOSIS — H547 Unspecified visual loss: Secondary | ICD-10-CM | POA: Diagnosis not present

## 2018-07-31 DIAGNOSIS — I10 Essential (primary) hypertension: Secondary | ICD-10-CM | POA: Diagnosis not present

## 2018-07-31 DIAGNOSIS — M109 Gout, unspecified: Secondary | ICD-10-CM | POA: Diagnosis not present

## 2018-08-05 ENCOUNTER — Ambulatory Visit: Payer: Medicare HMO | Admitting: Adult Health

## 2018-08-06 ENCOUNTER — Ambulatory Visit (INDEPENDENT_AMBULATORY_CARE_PROVIDER_SITE_OTHER): Payer: Medicare HMO | Admitting: Adult Health

## 2018-08-06 ENCOUNTER — Encounter: Payer: Self-pay | Admitting: Adult Health

## 2018-08-06 VITALS — BP 106/60 | HR 53 | Ht 72.0 in | Wt 230.0 lb

## 2018-08-06 DIAGNOSIS — I639 Cerebral infarction, unspecified: Secondary | ICD-10-CM

## 2018-08-06 DIAGNOSIS — I10 Essential (primary) hypertension: Secondary | ICD-10-CM | POA: Diagnosis not present

## 2018-08-06 DIAGNOSIS — I739 Peripheral vascular disease, unspecified: Secondary | ICD-10-CM | POA: Diagnosis not present

## 2018-08-06 DIAGNOSIS — E785 Hyperlipidemia, unspecified: Secondary | ICD-10-CM

## 2018-08-06 MED ORDER — ASPIRIN EC 325 MG PO TBEC: 325.0000 mg | DELAYED_RELEASE_TABLET | Freq: Every day | ORAL | 0 refills | Status: DC

## 2018-08-06 NOTE — Patient Instructions (Signed)
Continue clopidogrel 75 mg daily  and lipitor 80mg   for secondary stroke prevention  Continue to follow up with PCP regarding cholesterol and blood pressure management   Continue to follow up with cardiology as scheduled  Continue to stay active and maintain a healthy diet  Continue to monitor blood pressure at home  Maintain strict control of hypertension with blood pressure goal below 130/90, diabetes with hemoglobin A1c goal below 6.5% and cholesterol with LDL cholesterol (bad cholesterol) goal below 70 mg/dL. I also advised the patient to eat a healthy diet with plenty of whole grains, cereals, fruits and vegetables, exercise regularly and maintain ideal body weight.  Followup in the future with me in 3 months or call earlier if needed       Thank you for coming to see at [facility]. I hope we have been able to provide you high quality care today.  You may receive a patient satisfaction survey over the next few weeks. We would appreciate your feedback and comments so that we may continue to improve ourselves and the health of our patients.

## 2018-08-06 NOTE — Progress Notes (Signed)
[facility] [address] [place]. [place] [address] [phone]       OFFICE FOLLOW UP NOTE  [name] Date of Birth:  [birth year] Medical Record Number:  [id number]   Reason for Referral:  hospital stroke follow up  CHIEF COMPLAINT:  Chief Complaint  Patient presents with  . Hospitalization Follow-up    Hospital follow up room 9 pt alone pt has a cane, did not know  most of his medications his daughter wrote them  down on [facility] med list area     HPI: Jesus Lam is being seen today for initial visit in the office for left temporal occipital cortical infarct on 06/12/2018. History obtained from patient and chart review. Reviewed all radiology images and labs personally.  [name] is a 84 y.o. male with history of peripheral artery disease, combined systolic and diastolic heart failure hypertension, and hyperlipidemia,  who presented with confusion and speech difficulties. He did not receive IV t-PA due to late presentation.  CT head reviewed and was negative for acute abnormality.  MRI head reviewed and showed acute ischemia nonhemorrhagic left temporal occipital cortical infarct.  MRA unremarkable.  Carotid Doppler showed bilateral mild plaque but no stenosis.  2D echo showed an EF of 20 to 25% and negative for cardiac thrombus.  UDS positive for marijuana.  LDL 86 and recommended continuation of Lipitor 80 mg daily.  HTN stable during admission and recommended long-term BP goal normotensive range.  A1c satisfactory at 5.3.  Patient was previously on aspirin 81 mg and Plavix 75 mg and recommended aspirin 325 mg and continuation of Plavix 75 mg daily.  Due to cortical infarct, recommend outpatient TEE and possible loop recorder.  Patient was discharged home with recommendations of home health therapy.  Patient is being seen today for hospital follow-up.  He continues to have some speech difficulties but has been improving.  He did have home PT/ST and was  completed after approximately 6 weeks.  Patient states he did have an appointment with cardiology and underwent 30-day cardiac monitor for which he completed and does not believe atrial fibrillation was found (unable to determine where patient went or results of testing).  Patient also believes he underwent TEE testing.  He continues at this time on Plavix only as he believes he was told to stop aspirin 325 mg (unsure if this was by cardiology) but denies bleeding or bruising on Plavix.  Continues to take Lipitor 80 mg without side effects myalgias.  Blood pressure today satisfactory 106/60.  Patient currently lives with daughter but is able to complete all ADLs independently.  Patient is not driving and did receive medical transportation for today's appointment.  Denies new or worsening stroke/TIA symptoms.   ROS:   14 system review of systems performed and negative with exception of swelling in legs, hearing loss, blurred vision, feeling cold, joint pain, aching muscles, allergies, and slurred speech  PMH:  Past Medical History:  Diagnosis Date  . Anemia   . BPH (benign prostatic hyperplasia)   . Cataracts, bilateral   . DVT (deep venous thrombosis) (HCC)    dvt in left leg  . Dyslipidemia   . Gout   . Gout   . HTN (hypertension)   . Left leg pain   . Osteoarthritis   . PAD (peripheral artery disease) (HCC)   . Stasis dermatitis   . Stroke (HCC)   . Vitamin D deficiency     PSH:  Past Surgical History:  Procedure Laterality Date  . ABDOMINAL AORTOGRAM N/A 01/13/2018   Procedure: ABDOMINAL AORTOGRAM;  Surgeon: Elder Negus, MD;  Location: [facility];  Service: Cardiovascular;  Laterality: N/A;  . CATARACT EXTRACTION, BILATERAL    . LEFT HEART CATH AND CORONARY ANGIOGRAPHY N/A 01/13/2018   Procedure: LEFT HEART CATH AND CORONARY ANGIOGRAPHY;  Surgeon: Elder Negus, MD;  Location: [facility];  Service: Cardiovascular;  Laterality: N/A;  . LOWER EXTREMITY  ANGIOGRAPHY N/A 01/13/2018   Procedure: LOWER EXTREMITY ANGIOGRAPHY;  Surgeon: Elder Negus, MD;  Location: [facility];  Service: Cardiovascular;  Laterality: N/A;  . LOWER EXTREMITY ANGIOGRAPHY Right 01/27/2018   Procedure: LOWER EXTREMITY ANGIOGRAPHY;  Surgeon: Yates Decamp, MD;  Location: [facility];  Service: Cardiovascular;  Laterality: Right;  . PERIPHERAL VASCULAR ATHERECTOMY Left 01/13/2018   Procedure: PERIPHERAL VASCULAR ATHERECTOMY;  Surgeon: Elder Negus, MD;  Location: [facility];  Service: Cardiovascular;  Laterality: Left;  SFA WITH PTA DRUG COATED BALLOON  . PERIPHERAL VASCULAR INTERVENTION  01/27/2018   Procedure: PERIPHERAL VASCULAR INTERVENTION;  Surgeon: Yates Decamp, MD;  Location: [facility];  Service: Cardiovascular;;    Social History:  Social History   Socioeconomic History  . Marital status: Single    Spouse name: Not on file  . Number of children: Not on file  . Years of education: Not on file  . Highest education level: Not on file  Occupational History  . Not on file  Social Needs  . Financial resource strain: Not on file  . Food insecurity:    Worry: Not on file    Inability: Not on file  . Transportation needs:    Medical: Not on file    Non-medical: Not on file  Tobacco Use  . Smoking status: Never Smoker  . Smokeless tobacco: Never Used  Substance and Sexual Activity  . Alcohol use: Yes    Alcohol/week: 1.0 standard drinks    Types: 1 Shots of liquor per week    Frequency: Never    Comment: occasionally  . Drug use: Yes    Types: Marijuana    Comment: has not smoke since stroke   . Sexual activity: Not on file  Lifestyle  . Physical activity:    Days per week: Not on file    Minutes per session: Not on file  . Stress: Not on file  Relationships  . Social connections:    Talks on phone: Not on file    Gets together: Not on file    Attends religious service: Not on file    Active member of club or  organization: Not on file    Attends meetings of clubs or organizations: Not on file    Relationship status: Not on file  . Intimate partner violence:    Fear of current or ex partner: Not on file    Emotionally abused: Not on file    Physically abused: Not on file    Forced sexual activity: Not on file  Other Topics Concern  . Not on file  Social History Narrative   Lives alone. 2 daughters live here in [place]. Son lives in [place].    Family History:  Family History  Problem Relation Age of Onset  . Cancer Mother   . Cancer Father   . Alcohol abuse Father   . Breast cancer Daughter   . Stroke Daughter   . CVA Daughter     Medications:  Current Outpatient Medications on File Prior to Visit  Medication Sig Dispense Refill  . atorvastatin (LIPITOR) 80 MG tablet Take 80 mg by mouth daily.    . calamine lotion calamine lotion    . carvedilol (COREG) 6.25 MG tablet Take 6.25 mg by mouth 2 (two) times daily with a meal.    . chlorthalidone (HYGROTON) 25 MG tablet Take 25 mg by mouth daily.    . Cholecalciferol (VITAMIN D3) 5000 units CAPS Take 5,000 Units by mouth daily.    . clopidogrel (PLAVIX) 75 MG tablet Take 1 tablet (75 mg total) by mouth daily with breakfast. 60 tablet 3  . colchicine 0.6 MG tablet Take 1 tab daily until 48 hours after resolution of symptoms, or up to 7 days. 7 tablet 0  . Elastic Bandages & Supports (MEDICAL COMPRESSION STOCKINGS) MISC     . furosemide (LASIX) 20 MG tablet Take 20 mg by mouth daily.  0  . gabapentin (NEURONTIN) 300 MG capsule Take 300 mg by mouth at bedtime.    Marland Kitchen ketorolac (ACULAR) 0.5 % ophthalmic solution ketorolac 0.5 % eye drops    . Menthol-Methyl Salicylate (MUSCLE RUB) 10-15 % CREA muscle rub    . metoprolol succinate (TOPROL-XL) 25 MG 24 hr tablet Take 25 mg by mouth daily.  0  . Misc. Devices (RAISED TOILET SEAT/LOCK & ARMS) MISC raised toilet seat    . Misc. Devices (RUBBER BATH MAT) MISC bath mat    . Multiple  Vitamin (MULTIVITAMIN) capsule Take 1 capsule daily by mouth.    . Multiple Vitamins-Minerals (COMPLETE SENIOR PO)     . Omega-3 Fatty Acids (FISH OIL PO) Take 300 mg by mouth.    . tamsulosin (FLOMAX) 0.4 MG CAPS capsule Take 1 capsule (0.4 mg total) by mouth daily after supper. 30 capsule 1  . tobramycin (TOBREX) 0.3 % ophthalmic solution tobramycin 0.3 % eye drops    . vitamin C (ASCORBIC ACID) 500 MG tablet Take 500 mg daily by mouth.    . isosorbide-hydrALAZINE (BIDIL) 20-37.5 MG tablet Take by mouth.     No current facility-administered medications on file prior to visit.     Allergies:   Allergies  Allergen Reactions  . Shellfish Allergy Other (See Comments)    Gout      Physical Exam  Vitals:   08/06/18 0858  BP: 106/60  Pulse: (!) 53  Weight: 230 lb (104.3 kg)  Height: 6' (1.829 m)   Body mass index is 31.19 kg/m. No exam data present  General: well developed, well nourished, pleasant elderly African-American male, seated, in no evident distress Head: head normocephalic and atraumatic.   Neck: supple with no carotid or supraclavicular bruits Cardiovascular: regular rate and rhythm, no murmurs Musculoskeletal: no deformity Skin:  no rash/petichiae Vascular:  Normal pulses all extremities  Neurologic Exam Mental Status: Awake and fully alert.  Mild dysarthria with mild expressive aphasia.  Oriented to place and time. Recent and remote memory intact. Attention span, concentration and fund of knowledge appropriate. Mood and affect appropriate.  Cranial Nerves: Fundoscopic exam reveals sharp disc margins. Pupils equal, briskly reactive to light. Extraocular movements full without nystagmus. Visual fields full to confrontation. Hearing intact. Facial sensation intact. Face, tongue, palate moves normally and symmetrically.  Motor: Normal bulk and tone. Normal strength in all tested extremity muscles. Sensory.: intact to touch , pinprick , position and vibratory  sensation.  Coordination: Rapid alternating movements normal in all extremities. Finger-to-nose and heel-to-shin performed accurately bilaterally. Gait  and Station: Arises from chair without difficulty. Stance is normal. Gait demonstrates hobbled gait with assistance of cane due to left knee pain (chronic).  Unable to tandem walk. Reflexes: 1+ and symmetric. Toes downgoing.    NIHSS  2 Modified Rankin  1   Diagnostic Data (Labs, Imaging, Testing)  CT HEAD WO CONTRAST 06/12/2018 IMPRESSION: 1.  No significant abnormality identified.  MR BRAIN WO CONTRAST MR MRA HEAD  06/13/2018 IMPRESSION: MRI HEAD IMPRESSION: 1. Acute ischemic nonhemorrhagic left temporo-occipital cortical infarct. 2. Age-related cerebral atrophy with mild chronic small vessel ischemic disease. MRA HEAD IMPRESSION: 1. Negative intracranial MRA with no large vessel occlusion identified. No proximal high-grade or correctable stenosis. 2. Minor atherosclerotic change for age.  ECHOCARDIOGRAM 06/13/2018 Study Conclusions - Left ventricle: The cavity size was mildly dilated. Wall   thickness was increased increased in a pattern of mild to   moderate LVH. Systolic function was severely reduced. The   estimated ejection fraction was in the range of 25% to 30%.   Diffuse hypokinesis. Doppler parameters are consistent with   abnormal left ventricular relaxation (grade 1 diastolic   dysfunction). - Aortic valve: There was mild regurgitation. - Left atrium: The atrium was moderately dilated. - Right atrium: The atrium was mildly dilated.  VAS US CAROTID DUPLEX BILATERAL 06/13/2018 Final Interpretation: Right Carotid: The extracranial vessels were near-normal with only minimal wall thickening or plaque Left Carotid: The extracranial vessels were near-normal with only minimal wall thickening or plaque. Vertebrals: Bilateral vertebral arteries demonstrate antegrade flow. Subclavians: Normal flow hemodynamics were  seen in bilateral subclavian arteries.   ASSESSMENT: Jesus Lam is a 84 y.o. year old male here with left temporal occipital cortical infarct on 06/12/2018 secondary to possibly embolic without known source. Vascular risk factors include PAD, HTN, and HLD.  Patient is being seen today for hospital follow-up and overall continues to do well from a stroke standpoint with mild residual dysarthria/expressive aphasia.    PLAN: -Continue clopidogrel 75 mg daily  and Lipitor 80 mg for secondary stroke prevention -It is unclear if aspirin 325 mg was stopped by cardiology -will reach out to PCP in regards to name a cardiologist to ensure that aspirin 325 mg should be stopped -F/u with PCP regarding your PAD, HTN and HLD management -f/u with cardiologist as scheduled -continue to monitor BP at home -advised to continue to stay active and maintain a healthy diet -Maintain strict control of hypertension with blood pressure goal below 130/90, diabetes with hemoglobin A1c goal below 6.5% and cholesterol with LDL cholesterol (bad cholesterol) goal below 70 mg/dL. I also advised the patient to eat a healthy diet with plenty of whole grains, cereals, fruits and vegetables, exercise regularly and maintain ideal body weight.  Follow up in 3 months or call earlier if needed   Greater than 50% of time during this 25 minute visit was spent on counseling,explanation of diagnosis of left temporal occipital cortical infarct, reviewing risk factor management of PAD, HTN and HLD, planning of further management, discussion with patient and family and coordination of care    Jesus Lam, AGNP-BC  [facility] [address] [place], [place] [address]  Phone [phone] Fax [phone] Note: This document was prepared with digital dictation and possible smart phrase technology. Any transcriptional errors that result from this process are unintentional.

## 2018-08-07 DIAGNOSIS — R6889 Other general symptoms and signs: Secondary | ICD-10-CM | POA: Diagnosis not present

## 2018-08-09 NOTE — Progress Notes (Signed)
I agree with the above plan 

## 2018-08-10 DIAGNOSIS — R6889 Other general symptoms and signs: Secondary | ICD-10-CM | POA: Diagnosis not present

## 2018-08-12 DIAGNOSIS — R6889 Other general symptoms and signs: Secondary | ICD-10-CM | POA: Diagnosis not present

## 2018-08-14 DIAGNOSIS — I639 Cerebral infarction, unspecified: Secondary | ICD-10-CM | POA: Diagnosis not present

## 2018-08-17 DIAGNOSIS — I639 Cerebral infarction, unspecified: Secondary | ICD-10-CM | POA: Diagnosis not present

## 2018-08-18 DIAGNOSIS — I639 Cerebral infarction, unspecified: Secondary | ICD-10-CM | POA: Diagnosis not present

## 2018-08-19 DIAGNOSIS — I639 Cerebral infarction, unspecified: Secondary | ICD-10-CM | POA: Diagnosis not present

## 2018-08-19 DIAGNOSIS — R6889 Other general symptoms and signs: Secondary | ICD-10-CM | POA: Diagnosis not present

## 2018-08-20 DIAGNOSIS — I639 Cerebral infarction, unspecified: Secondary | ICD-10-CM | POA: Diagnosis not present

## 2018-08-20 DIAGNOSIS — R6889 Other general symptoms and signs: Secondary | ICD-10-CM | POA: Diagnosis not present

## 2018-08-21 DIAGNOSIS — I639 Cerebral infarction, unspecified: Secondary | ICD-10-CM | POA: Diagnosis not present

## 2018-08-24 DIAGNOSIS — I639 Cerebral infarction, unspecified: Secondary | ICD-10-CM | POA: Diagnosis not present

## 2018-08-25 DIAGNOSIS — I639 Cerebral infarction, unspecified: Secondary | ICD-10-CM | POA: Diagnosis not present

## 2018-08-26 DIAGNOSIS — R6889 Other general symptoms and signs: Secondary | ICD-10-CM | POA: Diagnosis not present

## 2018-08-26 DIAGNOSIS — I639 Cerebral infarction, unspecified: Secondary | ICD-10-CM | POA: Diagnosis not present

## 2018-08-27 DIAGNOSIS — H5203 Hypermetropia, bilateral: Secondary | ICD-10-CM | POA: Diagnosis not present

## 2018-08-27 DIAGNOSIS — H52209 Unspecified astigmatism, unspecified eye: Secondary | ICD-10-CM | POA: Diagnosis not present

## 2018-08-27 DIAGNOSIS — I639 Cerebral infarction, unspecified: Secondary | ICD-10-CM | POA: Diagnosis not present

## 2018-08-27 DIAGNOSIS — R6889 Other general symptoms and signs: Secondary | ICD-10-CM | POA: Diagnosis not present

## 2018-08-27 DIAGNOSIS — H524 Presbyopia: Secondary | ICD-10-CM | POA: Diagnosis not present

## 2018-08-28 DIAGNOSIS — R6889 Other general symptoms and signs: Secondary | ICD-10-CM | POA: Diagnosis not present

## 2018-08-28 DIAGNOSIS — I639 Cerebral infarction, unspecified: Secondary | ICD-10-CM | POA: Diagnosis not present

## 2018-08-29 DIAGNOSIS — R6889 Other general symptoms and signs: Secondary | ICD-10-CM | POA: Diagnosis not present

## 2018-08-31 DIAGNOSIS — I639 Cerebral infarction, unspecified: Secondary | ICD-10-CM | POA: Diagnosis not present

## 2018-09-01 DIAGNOSIS — I639 Cerebral infarction, unspecified: Secondary | ICD-10-CM | POA: Diagnosis not present

## 2018-09-02 DIAGNOSIS — I639 Cerebral infarction, unspecified: Secondary | ICD-10-CM | POA: Diagnosis not present

## 2018-09-02 DIAGNOSIS — R6889 Other general symptoms and signs: Secondary | ICD-10-CM | POA: Diagnosis not present

## 2018-09-03 DIAGNOSIS — I639 Cerebral infarction, unspecified: Secondary | ICD-10-CM | POA: Diagnosis not present

## 2018-09-04 DIAGNOSIS — I639 Cerebral infarction, unspecified: Secondary | ICD-10-CM | POA: Diagnosis not present

## 2018-09-04 DIAGNOSIS — R6889 Other general symptoms and signs: Secondary | ICD-10-CM | POA: Diagnosis not present

## 2018-09-07 DIAGNOSIS — R6889 Other general symptoms and signs: Secondary | ICD-10-CM | POA: Diagnosis not present

## 2018-09-07 DIAGNOSIS — I639 Cerebral infarction, unspecified: Secondary | ICD-10-CM | POA: Diagnosis not present

## 2018-09-08 DIAGNOSIS — I639 Cerebral infarction, unspecified: Secondary | ICD-10-CM | POA: Diagnosis not present

## 2018-09-09 DIAGNOSIS — R6889 Other general symptoms and signs: Secondary | ICD-10-CM | POA: Diagnosis not present

## 2018-09-09 DIAGNOSIS — I639 Cerebral infarction, unspecified: Secondary | ICD-10-CM | POA: Diagnosis not present

## 2018-09-10 DIAGNOSIS — I639 Cerebral infarction, unspecified: Secondary | ICD-10-CM | POA: Diagnosis not present

## 2018-09-11 DIAGNOSIS — I639 Cerebral infarction, unspecified: Secondary | ICD-10-CM | POA: Diagnosis not present

## 2018-09-14 DIAGNOSIS — I639 Cerebral infarction, unspecified: Secondary | ICD-10-CM | POA: Diagnosis not present

## 2018-09-15 ENCOUNTER — Telehealth: Payer: Self-pay | Admitting: Nurse Practitioner

## 2018-09-15 DIAGNOSIS — I639 Cerebral infarction, unspecified: Secondary | ICD-10-CM | POA: Diagnosis not present

## 2018-09-15 NOTE — Telephone Encounter (Signed)
I left a message asking the pt to come in at 9:30 instead of 10:45 on 10/29/18. VDM (DD)

## 2018-09-16 DIAGNOSIS — I639 Cerebral infarction, unspecified: Secondary | ICD-10-CM | POA: Diagnosis not present

## 2018-09-17 DIAGNOSIS — I639 Cerebral infarction, unspecified: Secondary | ICD-10-CM | POA: Diagnosis not present

## 2018-09-18 DIAGNOSIS — I639 Cerebral infarction, unspecified: Secondary | ICD-10-CM | POA: Diagnosis not present

## 2018-09-21 DIAGNOSIS — I639 Cerebral infarction, unspecified: Secondary | ICD-10-CM | POA: Diagnosis not present

## 2018-09-21 DIAGNOSIS — R6889 Other general symptoms and signs: Secondary | ICD-10-CM | POA: Diagnosis not present

## 2018-09-22 DIAGNOSIS — I639 Cerebral infarction, unspecified: Secondary | ICD-10-CM | POA: Diagnosis not present

## 2018-09-23 DIAGNOSIS — I639 Cerebral infarction, unspecified: Secondary | ICD-10-CM | POA: Diagnosis not present

## 2018-09-23 DIAGNOSIS — R6889 Other general symptoms and signs: Secondary | ICD-10-CM | POA: Diagnosis not present

## 2018-09-24 DIAGNOSIS — I639 Cerebral infarction, unspecified: Secondary | ICD-10-CM | POA: Diagnosis not present

## 2018-09-25 DIAGNOSIS — R6889 Other general symptoms and signs: Secondary | ICD-10-CM | POA: Diagnosis not present

## 2018-09-25 DIAGNOSIS — I639 Cerebral infarction, unspecified: Secondary | ICD-10-CM | POA: Diagnosis not present

## 2018-09-28 DIAGNOSIS — I639 Cerebral infarction, unspecified: Secondary | ICD-10-CM | POA: Diagnosis not present

## 2018-09-29 DIAGNOSIS — I639 Cerebral infarction, unspecified: Secondary | ICD-10-CM | POA: Diagnosis not present

## 2018-09-30 DIAGNOSIS — R6889 Other general symptoms and signs: Secondary | ICD-10-CM | POA: Diagnosis not present

## 2018-09-30 DIAGNOSIS — I639 Cerebral infarction, unspecified: Secondary | ICD-10-CM | POA: Diagnosis not present

## 2018-10-01 DIAGNOSIS — I639 Cerebral infarction, unspecified: Secondary | ICD-10-CM | POA: Diagnosis not present

## 2018-10-02 DIAGNOSIS — I639 Cerebral infarction, unspecified: Secondary | ICD-10-CM | POA: Diagnosis not present

## 2018-10-02 DIAGNOSIS — R6889 Other general symptoms and signs: Secondary | ICD-10-CM | POA: Diagnosis not present

## 2018-10-05 DIAGNOSIS — I639 Cerebral infarction, unspecified: Secondary | ICD-10-CM | POA: Diagnosis not present

## 2018-10-05 DIAGNOSIS — R6889 Other general symptoms and signs: Secondary | ICD-10-CM | POA: Diagnosis not present

## 2018-10-06 DIAGNOSIS — I639 Cerebral infarction, unspecified: Secondary | ICD-10-CM | POA: Diagnosis not present

## 2018-10-07 DIAGNOSIS — R6889 Other general symptoms and signs: Secondary | ICD-10-CM | POA: Diagnosis not present

## 2018-10-07 DIAGNOSIS — I639 Cerebral infarction, unspecified: Secondary | ICD-10-CM | POA: Diagnosis not present

## 2018-10-08 DIAGNOSIS — I639 Cerebral infarction, unspecified: Secondary | ICD-10-CM | POA: Diagnosis not present

## 2018-10-09 DIAGNOSIS — I639 Cerebral infarction, unspecified: Secondary | ICD-10-CM | POA: Diagnosis not present

## 2018-10-12 DIAGNOSIS — R6889 Other general symptoms and signs: Secondary | ICD-10-CM | POA: Diagnosis not present

## 2018-10-12 DIAGNOSIS — I639 Cerebral infarction, unspecified: Secondary | ICD-10-CM | POA: Diagnosis not present

## 2018-10-13 DIAGNOSIS — I639 Cerebral infarction, unspecified: Secondary | ICD-10-CM | POA: Diagnosis not present

## 2018-10-14 DIAGNOSIS — I639 Cerebral infarction, unspecified: Secondary | ICD-10-CM | POA: Diagnosis not present

## 2018-10-14 DIAGNOSIS — R6889 Other general symptoms and signs: Secondary | ICD-10-CM | POA: Diagnosis not present

## 2018-10-15 DIAGNOSIS — I639 Cerebral infarction, unspecified: Secondary | ICD-10-CM | POA: Diagnosis not present

## 2018-10-16 DIAGNOSIS — R6889 Other general symptoms and signs: Secondary | ICD-10-CM | POA: Diagnosis not present

## 2018-10-16 DIAGNOSIS — I639 Cerebral infarction, unspecified: Secondary | ICD-10-CM | POA: Diagnosis not present

## 2018-10-19 DIAGNOSIS — I639 Cerebral infarction, unspecified: Secondary | ICD-10-CM | POA: Diagnosis not present

## 2018-10-19 DIAGNOSIS — R6889 Other general symptoms and signs: Secondary | ICD-10-CM | POA: Diagnosis not present

## 2018-10-20 DIAGNOSIS — I639 Cerebral infarction, unspecified: Secondary | ICD-10-CM | POA: Diagnosis not present

## 2018-10-21 DIAGNOSIS — R6889 Other general symptoms and signs: Secondary | ICD-10-CM | POA: Diagnosis not present

## 2018-10-21 DIAGNOSIS — I639 Cerebral infarction, unspecified: Secondary | ICD-10-CM | POA: Diagnosis not present

## 2018-10-22 DIAGNOSIS — I639 Cerebral infarction, unspecified: Secondary | ICD-10-CM | POA: Diagnosis not present

## 2018-10-23 ENCOUNTER — Telehealth: Payer: Self-pay | Admitting: Oncology

## 2018-10-23 ENCOUNTER — Inpatient Hospital Stay: Payer: Medicare HMO | Admitting: Oncology

## 2018-10-23 ENCOUNTER — Inpatient Hospital Stay: Payer: Medicare HMO

## 2018-10-23 DIAGNOSIS — I639 Cerebral infarction, unspecified: Secondary | ICD-10-CM | POA: Diagnosis not present

## 2018-10-23 NOTE — Telephone Encounter (Signed)
Called regarding 12/24 °

## 2018-10-26 DIAGNOSIS — I639 Cerebral infarction, unspecified: Secondary | ICD-10-CM | POA: Diagnosis not present

## 2018-10-26 DIAGNOSIS — R6889 Other general symptoms and signs: Secondary | ICD-10-CM | POA: Diagnosis not present

## 2018-10-27 ENCOUNTER — Telehealth: Payer: Self-pay | Admitting: *Deleted

## 2018-10-27 DIAGNOSIS — I639 Cerebral infarction, unspecified: Secondary | ICD-10-CM | POA: Diagnosis not present

## 2018-10-27 NOTE — Telephone Encounter (Signed)
"  Jesus Lam calling to ask when a nurse comes to my home this week?"  Informed this is [facility], office location for his provider Dr, Clelia Croft for clarification.  November 17, 2018 appointment information provided at this time.  Instructed to arrive at 2:30 pm for registration process.  Denies further questions or needs at this time.

## 2018-10-28 DIAGNOSIS — I639 Cerebral infarction, unspecified: Secondary | ICD-10-CM | POA: Diagnosis not present

## 2018-10-28 DIAGNOSIS — R6889 Other general symptoms and signs: Secondary | ICD-10-CM | POA: Diagnosis not present

## 2018-10-28 DIAGNOSIS — Z0189 Encounter for other specified special examinations: Secondary | ICD-10-CM | POA: Diagnosis not present

## 2018-10-28 DIAGNOSIS — Z8673 Personal history of transient ischemic attack (TIA), and cerebral infarction without residual deficits: Secondary | ICD-10-CM | POA: Diagnosis not present

## 2018-10-28 DIAGNOSIS — I70219 Atherosclerosis of native arteries of extremities with intermittent claudication, unspecified extremity: Secondary | ICD-10-CM | POA: Diagnosis not present

## 2018-10-28 DIAGNOSIS — I10 Essential (primary) hypertension: Secondary | ICD-10-CM | POA: Diagnosis not present

## 2018-10-29 ENCOUNTER — Encounter: Payer: Self-pay | Admitting: Internal Medicine

## 2018-10-29 ENCOUNTER — Other Ambulatory Visit: Payer: Self-pay

## 2018-10-29 ENCOUNTER — Ambulatory Visit: Payer: Medicare HMO

## 2018-10-29 ENCOUNTER — Other Ambulatory Visit: Payer: Self-pay | Admitting: Internal Medicine

## 2018-10-29 ENCOUNTER — Ambulatory Visit (INDEPENDENT_AMBULATORY_CARE_PROVIDER_SITE_OTHER): Payer: Medicare HMO | Admitting: Internal Medicine

## 2018-10-29 ENCOUNTER — Ambulatory Visit: Payer: Medicare HMO | Admitting: Nurse Practitioner

## 2018-10-29 VITALS — BP 134/64 | HR 60 | Temp 98.4°F | Wt 247.2 lb

## 2018-10-29 DIAGNOSIS — I10 Essential (primary) hypertension: Secondary | ICD-10-CM

## 2018-10-29 DIAGNOSIS — Z23 Encounter for immunization: Secondary | ICD-10-CM

## 2018-10-29 DIAGNOSIS — H93233 Hyperacusis, bilateral: Secondary | ICD-10-CM | POA: Diagnosis not present

## 2018-10-29 DIAGNOSIS — Z9114 Patient's other noncompliance with medication regimen: Secondary | ICD-10-CM

## 2018-10-29 DIAGNOSIS — E7800 Pure hypercholesterolemia, unspecified: Secondary | ICD-10-CM | POA: Diagnosis not present

## 2018-10-29 DIAGNOSIS — I639 Cerebral infarction, unspecified: Secondary | ICD-10-CM | POA: Diagnosis not present

## 2018-10-29 MED ORDER — ATORVASTATIN CALCIUM 80 MG PO TABS: 80.0000 mg | ORAL_TABLET | Freq: Every day | ORAL | 0 refills | Status: DC

## 2018-10-29 NOTE — Patient Instructions (Addendum)
1- Take your blood pressure cuff to your cardiologist appointment in 10 days and have them check your machine with theirs to make sure the reading is accurate  2- check your blood pressure every day til you see your heart Dr and record it and take it to him. After day, take it 3 times a week 3 hours after taking your  Blood pressure medication and bring that with you when you come back to see Jesus Lam in 3 months.   3- I placed a referral for ears nose and throat Dr. To have your hearing checked.   4- Follow the instructions of your cardiolgist regarding your medication.   5- Your blood pressure needs to be less than 140/90, if more than this three times or more, please call us.

## 2018-10-29 NOTE — Progress Notes (Signed)
Subjective:     Patient ID: Jesus Lam , male    DOB: [birth year] , 84 y.o.   MRN: [id number]   Chief Complaint  Patient presents with  . Hypertension    HPI Pt is here for HTN FU. States that he has not been taking his cholesterol medication x 2 months and does not know why. He Saw his cardiologist yesterday,  And he went over his meds and told him to d/c all his meds for 2 weeks. His next apt is 12/14.  BP at home has been140's/ 50's    Past Medical History:  Diagnosis Date  . Anemia   . BPH (benign prostatic hyperplasia)   . Cataracts, bilateral   . DVT (deep venous thrombosis) (HCC)    dvt in left leg  . Dyslipidemia   . Gout   . Gout   . HTN (hypertension)   . Left leg pain   . Osteoarthritis   . PAD (peripheral artery disease) (HCC)   . Stasis dermatitis   . Stroke (HCC)   . Vitamin D deficiency      Family History  Problem Relation Age of Onset  . Cancer Mother   . Cancer Father   . Alcohol abuse Father   . Breast cancer Daughter   . Stroke Daughter   . CVA Daughter      Current Outpatient Medications:  .  amLODipine (NORVASC) 5 MG tablet, Take 5 mg by mouth daily., Disp: , Rfl:  .  bisoprolol (ZEBETA) 5 MG tablet, Take 5 mg by mouth daily., Disp: , Rfl:  .  Cholecalciferol (VITAMIN D3) 5000 units CAPS, Take 5,000 Units by mouth daily., Disp: , Rfl:  .  colchicine 0.6 MG tablet, Take 1 tab daily until 48 hours after resolution of symptoms, or up to 7 days., Disp: 7 tablet, Rfl: 0 .  furosemide (LASIX) 20 MG tablet, Take 20 mg by mouth daily., Disp: , Rfl: 0 .  gabapentin (NEURONTIN) 300 MG capsule, Take 300 mg by mouth at bedtime., Disp: , Rfl:  .  Multiple Vitamin (MULTIVITAMIN) capsule, Take 1 capsule daily by mouth., Disp: , Rfl:  .  Multiple Vitamins-Minerals (COMPLETE SENIOR PO), , Disp: , Rfl:  .  Omega-3 Fatty Acids (FISH OIL PO), Take 300 mg by mouth., Disp: , Rfl:  .  tamsulosin (FLOMAX) 0.4 MG CAPS capsule, Take 1 capsule (0.4 mg total) by  mouth daily after supper., Disp: 30 capsule, Rfl: 1 .  tobramycin (TOBREX) 0.3 % ophthalmic solution, tobramycin 0.3 % eye drops, Disp: , Rfl:  .  vitamin C (ASCORBIC ACID) 500 MG tablet, Take 500 mg daily by mouth., Disp: , Rfl:  .  atorvastatin (LIPITOR) 80 MG tablet, Take 80 mg by mouth daily., Disp: , Rfl:  .  calamine lotion, calamine lotion, Disp: , Rfl:  .  carvedilol (COREG) 6.25 MG tablet, Take 6.25 mg by mouth 2 (two) times daily with a meal., Disp: , Rfl:  .  chlorthalidone (HYGROTON) 25 MG tablet, Take 25 mg by mouth daily., Disp: , Rfl:  .  clopidogrel (PLAVIX) 75 MG tablet, Take 1 tablet (75 mg total) by mouth daily with breakfast. (Patient not taking: Reported on 10/29/2018), Disp: 60 tablet, Rfl: 3 .  Elastic Bandages & Supports (MEDICAL COMPRESSION STOCKINGS) MISC, , Disp: , Rfl:  .  isosorbide-hydrALAZINE (BIDIL) 20-37.5 MG tablet, Take by mouth., Disp: , Rfl:  .  ketorolac (ACULAR) 0.5 % ophthalmic solution, ketorolac 0.5 % eye drops, Disp: , Rfl:  .  Menthol-Methyl Salicylate (MUSCLE RUB) 10-15 % CREA, muscle rub, Disp: , Rfl:  .  metoprolol succinate (TOPROL-XL) 25 MG 24 hr tablet, Take 25 mg by mouth daily., Disp: , Rfl: 0 .  Misc. Devices (RAISED TOILET SEAT/LOCK & ARMS) MISC, raised toilet seat, Disp: , Rfl:  .  Misc. Devices (RUBBER BATH MAT) MISC, bath mat, Disp: , Rfl:    Allergies  Allergen Reactions  . Shellfish Allergy Other (See Comments)    Gout      Review of Systems  Constitutional: Negative for chills, diaphoresis and fever.  HENT: Positive for hearing loss.   Eyes: Negative for visual disturbance.  Respiratory: Negative for cough, chest tightness and shortness of breath.   Cardiovascular: Positive for leg swelling. Negative for chest pain and palpitations.  Gastrointestinal: Negative for abdominal pain, constipation, diarrhea and nausea.       Denies GERD  Genitourinary: Positive for frequency. Negative for dysuria and urgency.       Drinks a lot in  the pm and causes nocturia  Musculoskeletal: Positive for gait problem.       Uses a cane to walk  Skin: Negative for rash and wound.  Neurological: Positive for numbness. Negative for dizziness, facial asymmetry, weakness, light-headedness and headaches.       Sometimes hands get numb when they get swollen or uses them a lot     Today's Vitals   10/29/18 0914  BP: 134/64  Pulse: 60  Temp: 98.4 F (36.9 C)  TempSrc: Oral  SpO2: 92%  Weight: 247 lb 3.2 oz (112.1 kg)   Body mass index is 33.53 kg/m.   Objective:  Physical Exam   Constitutional: He is oriented to person, place, and time. He appears well-developed and well-nourished. No distress. Has hard time hearing it and I have a little hard time understanding his speech.  HENT:  Head: Normocephalic and atraumatic. No face asymmetry noted. Right Ear: External ear normal. Has small amount of wax.  Left Ear: External ear normal. Has small amt of wax.  Nose: Nose normal.  Eyes: Conjunctivae are normal. Right eye exhibits no discharge. Left eye exhibits no discharge. No scleral icterus.  Neck: Neck supple. No thyromegaly present.  No carotid bruits bilaterally  Cardiovascular: Normal rate and regular rhythm.  No murmur heard. Pulmonary/Chest: Effort normal and breath sounds normal. No respiratory distress.  Musculoskeletal: Normal range of motion. Has +1/4 pitting edema of ankles to mid shin.  Lymphadenopathy:    She has no cervical adenopathy.  Neurological: She is alert and oriented to person, place, and time.  Skin: Skin is warm and dry. No rash noted. Does have venostasis skin changes of lower legs. He is not diaphoretic.  Psychiatric: She has a normal mood and affect. Her behavior is normal. Judgment and thought content normal.  Nursing note reviewed.  Assessment And Plan:   1. Need for vaccination - Flu vaccine HIGH DOSE PF (Fluzone High dose)  2. Essential hypertension- chronic - CMP14 + Anion Gap - CBC no  Diff  3. Pure hypercholesterolemia- chronic - Lipid Profile - TSH - T3, free - T4, Free I refilled his Lipitor.  4. Hearing abnormally acute, bilateral- chronic  - Ambulatory referral to ENT Needs to follow the cardiologist advised.  I will request cardiology records from Dr Truett Mainland. I dont see a note from yesterday like pt stated to me.   Hyden Soley RODRIGUEZ-SOUTHWORTH, PA-C

## 2018-10-29 NOTE — Progress Notes (Unsigned)
Please request cardiology records from Dr Truett Mainland

## 2018-10-30 LAB — CMP14 + ANION GAP
ALT: 16 IU/L (ref 0–44)
AST: 21 IU/L (ref 0–40)
Albumin/Globulin Ratio: 1.1 — ABNORMAL LOW (ref 1.2–2.2)
Albumin: 3.7 g/dL (ref 3.5–4.7)
Alkaline Phosphatase: 83 IU/L (ref 39–117)
Anion Gap: 15 mmol/L (ref 10.0–18.0)
BUN/Creatinine Ratio: 10 (ref 10–24)
BUN: 15 mg/dL (ref 8–27)
Bilirubin Total: 0.3 mg/dL (ref 0.0–1.2)
CALCIUM: 9.4 mg/dL (ref 8.6–10.2)
CHLORIDE: 105 mmol/L (ref 96–106)
CO2: 24 mmol/L (ref 20–29)
Creatinine, Ser: 1.43 mg/dL — ABNORMAL HIGH (ref 0.76–1.27)
GFR calc Af Amer: 52 mL/min/{1.73_m2} — ABNORMAL LOW (ref >59)
GFR calc non Af Amer: 45 mL/min/{1.73_m2} — ABNORMAL LOW (ref >59)
Globulin, Total: 3.4 g/dL (ref 1.5–4.5)
Glucose: 106 mg/dL — ABNORMAL HIGH (ref 65–99)
Potassium: 4.5 mmol/L (ref 3.5–5.2)
Sodium: 144 mmol/L (ref 134–144)
Total Protein: 7.1 g/dL (ref 6.0–8.5)

## 2018-10-30 LAB — CBC
Hematocrit: 35.7 % — ABNORMAL LOW (ref 37.5–51.0)
Hemoglobin: 11.7 g/dL — ABNORMAL LOW (ref 13.0–17.7)
MCH: 29.4 pg (ref 26.6–33.0)
MCHC: 32.8 g/dL (ref 31.5–35.7)
MCV: 90 fL (ref 79–97)
Platelets: 171 10*3/uL (ref 150–450)
RBC: 3.98 x10E6/uL — AB (ref 4.14–5.80)
RDW: 12.9 % (ref 12.3–15.4)
WBC: 4.8 10*3/uL (ref 3.4–10.8)

## 2018-10-30 LAB — LIPID PANEL
CHOLESTEROL TOTAL: 205 mg/dL — AB (ref 100–199)
Chol/HDL Ratio: 9.8 ratio — ABNORMAL HIGH (ref 0.0–5.0)
HDL: 21 mg/dL — ABNORMAL LOW (ref >39)
TRIGLYCERIDES: 597 mg/dL — AB (ref 0–149)

## 2018-10-30 LAB — TSH: TSH: 0.018 u[IU]/mL — ABNORMAL LOW (ref 0.450–4.500)

## 2018-10-30 LAB — T4, FREE: Free T4: 1.35 ng/dL (ref 0.82–1.77)

## 2018-10-30 LAB — T3, FREE: T3, Free: 3.2 pg/mL (ref 2.0–4.4)

## 2018-11-02 DIAGNOSIS — M1712 Unilateral primary osteoarthritis, left knee: Secondary | ICD-10-CM | POA: Diagnosis not present

## 2018-11-02 DIAGNOSIS — M25562 Pain in left knee: Secondary | ICD-10-CM | POA: Diagnosis not present

## 2018-11-04 DIAGNOSIS — R6889 Other general symptoms and signs: Secondary | ICD-10-CM | POA: Diagnosis not present

## 2018-11-05 ENCOUNTER — Other Ambulatory Visit: Payer: Self-pay | Admitting: Internal Medicine

## 2018-11-05 DIAGNOSIS — R946 Abnormal results of thyroid function studies: Secondary | ICD-10-CM

## 2018-11-05 DIAGNOSIS — M25662 Stiffness of left knee, not elsewhere classified: Secondary | ICD-10-CM | POA: Diagnosis not present

## 2018-11-05 DIAGNOSIS — M25562 Pain in left knee: Secondary | ICD-10-CM | POA: Diagnosis not present

## 2018-11-05 DIAGNOSIS — M25462 Effusion, left knee: Secondary | ICD-10-CM | POA: Diagnosis not present

## 2018-11-05 DIAGNOSIS — R269 Unspecified abnormalities of gait and mobility: Secondary | ICD-10-CM | POA: Diagnosis not present

## 2018-11-05 NOTE — Progress Notes (Signed)
Repeat TSH ordered  

## 2018-11-06 ENCOUNTER — Other Ambulatory Visit: Payer: Medicare HMO

## 2018-11-06 DIAGNOSIS — I639 Cerebral infarction, unspecified: Secondary | ICD-10-CM | POA: Diagnosis not present

## 2018-11-06 DIAGNOSIS — R946 Abnormal results of thyroid function studies: Secondary | ICD-10-CM | POA: Diagnosis not present

## 2018-11-07 LAB — TSH: TSH: 0.047 u[IU]/mL — ABNORMAL LOW (ref 0.450–4.500)

## 2018-11-09 DIAGNOSIS — I639 Cerebral infarction, unspecified: Secondary | ICD-10-CM | POA: Diagnosis not present

## 2018-11-09 DIAGNOSIS — R6889 Other general symptoms and signs: Secondary | ICD-10-CM | POA: Diagnosis not present

## 2018-11-10 DIAGNOSIS — H9312 Tinnitus, left ear: Secondary | ICD-10-CM | POA: Diagnosis not present

## 2018-11-10 DIAGNOSIS — M25562 Pain in left knee: Secondary | ICD-10-CM | POA: Diagnosis not present

## 2018-11-10 DIAGNOSIS — I639 Cerebral infarction, unspecified: Secondary | ICD-10-CM | POA: Diagnosis not present

## 2018-11-10 DIAGNOSIS — M25662 Stiffness of left knee, not elsewhere classified: Secondary | ICD-10-CM | POA: Diagnosis not present

## 2018-11-10 DIAGNOSIS — H6122 Impacted cerumen, left ear: Secondary | ICD-10-CM | POA: Diagnosis not present

## 2018-11-10 DIAGNOSIS — H903 Sensorineural hearing loss, bilateral: Secondary | ICD-10-CM | POA: Diagnosis not present

## 2018-11-10 DIAGNOSIS — R269 Unspecified abnormalities of gait and mobility: Secondary | ICD-10-CM | POA: Diagnosis not present

## 2018-11-10 DIAGNOSIS — M25462 Effusion, left knee: Secondary | ICD-10-CM | POA: Diagnosis not present

## 2018-11-11 ENCOUNTER — Other Ambulatory Visit: Payer: Self-pay | Admitting: Internal Medicine

## 2018-11-11 DIAGNOSIS — I639 Cerebral infarction, unspecified: Secondary | ICD-10-CM | POA: Diagnosis not present

## 2018-11-11 DIAGNOSIS — E0590 Thyrotoxicosis, unspecified without thyrotoxic crisis or storm: Secondary | ICD-10-CM

## 2018-11-11 DIAGNOSIS — R6889 Other general symptoms and signs: Secondary | ICD-10-CM | POA: Diagnosis not present

## 2018-11-11 NOTE — Progress Notes (Signed)
Endocrinology referral made.

## 2018-11-12 DIAGNOSIS — I639 Cerebral infarction, unspecified: Secondary | ICD-10-CM | POA: Diagnosis not present

## 2018-11-13 DIAGNOSIS — M25562 Pain in left knee: Secondary | ICD-10-CM | POA: Diagnosis not present

## 2018-11-13 DIAGNOSIS — R269 Unspecified abnormalities of gait and mobility: Secondary | ICD-10-CM | POA: Diagnosis not present

## 2018-11-13 DIAGNOSIS — M25462 Effusion, left knee: Secondary | ICD-10-CM | POA: Diagnosis not present

## 2018-11-13 DIAGNOSIS — I639 Cerebral infarction, unspecified: Secondary | ICD-10-CM | POA: Diagnosis not present

## 2018-11-13 DIAGNOSIS — R6889 Other general symptoms and signs: Secondary | ICD-10-CM | POA: Diagnosis not present

## 2018-11-13 DIAGNOSIS — M25662 Stiffness of left knee, not elsewhere classified: Secondary | ICD-10-CM | POA: Diagnosis not present

## 2018-11-16 DIAGNOSIS — R6889 Other general symptoms and signs: Secondary | ICD-10-CM | POA: Diagnosis not present

## 2018-11-16 DIAGNOSIS — I639 Cerebral infarction, unspecified: Secondary | ICD-10-CM | POA: Diagnosis not present

## 2018-11-16 DIAGNOSIS — M25662 Stiffness of left knee, not elsewhere classified: Secondary | ICD-10-CM | POA: Diagnosis not present

## 2018-11-16 DIAGNOSIS — R269 Unspecified abnormalities of gait and mobility: Secondary | ICD-10-CM | POA: Diagnosis not present

## 2018-11-16 DIAGNOSIS — M25562 Pain in left knee: Secondary | ICD-10-CM | POA: Diagnosis not present

## 2018-11-16 DIAGNOSIS — M25462 Effusion, left knee: Secondary | ICD-10-CM | POA: Diagnosis not present

## 2018-11-17 ENCOUNTER — Encounter: Payer: Self-pay | Admitting: Internal Medicine

## 2018-11-17 ENCOUNTER — Inpatient Hospital Stay (HOSPITAL_BASED_OUTPATIENT_CLINIC_OR_DEPARTMENT_OTHER): Payer: Medicare HMO | Admitting: Oncology

## 2018-11-17 ENCOUNTER — Inpatient Hospital Stay: Payer: Medicare HMO | Attending: Oncology

## 2018-11-17 ENCOUNTER — Ambulatory Visit (INDEPENDENT_AMBULATORY_CARE_PROVIDER_SITE_OTHER): Payer: Medicare HMO | Admitting: Internal Medicine

## 2018-11-17 VITALS — BP 148/78 | HR 70 | Ht 72.0 in | Wt 245.0 lb

## 2018-11-17 VITALS — BP 144/71 | HR 90 | Temp 98.3°F | Resp 18 | Ht 72.0 in | Wt 245.1 lb

## 2018-11-17 DIAGNOSIS — N189 Chronic kidney disease, unspecified: Secondary | ICD-10-CM

## 2018-11-17 DIAGNOSIS — R7989 Other specified abnormal findings of blood chemistry: Secondary | ICD-10-CM | POA: Diagnosis not present

## 2018-11-17 DIAGNOSIS — E0590 Thyrotoxicosis, unspecified without thyrotoxic crisis or storm: Secondary | ICD-10-CM

## 2018-11-17 DIAGNOSIS — R6889 Other general symptoms and signs: Secondary | ICD-10-CM | POA: Diagnosis not present

## 2018-11-17 DIAGNOSIS — Z79899 Other long term (current) drug therapy: Secondary | ICD-10-CM | POA: Diagnosis not present

## 2018-11-17 DIAGNOSIS — D649 Anemia, unspecified: Secondary | ICD-10-CM

## 2018-11-17 DIAGNOSIS — D631 Anemia in chronic kidney disease: Secondary | ICD-10-CM

## 2018-11-17 DIAGNOSIS — I639 Cerebral infarction, unspecified: Secondary | ICD-10-CM | POA: Diagnosis not present

## 2018-11-17 LAB — CBC WITH DIFFERENTIAL (CANCER CENTER ONLY)
ABS IMMATURE GRANULOCYTES: 0.01 10*3/uL (ref 0.00–0.07)
Basophils Absolute: 0 10*3/uL (ref 0.0–0.1)
Basophils Relative: 1 %
Eosinophils Absolute: 0.1 10*3/uL (ref 0.0–0.5)
Eosinophils Relative: 2 %
HCT: 38.7 % — ABNORMAL LOW (ref 39.0–52.0)
HEMOGLOBIN: 12.1 g/dL — AB (ref 13.0–17.0)
Immature Granulocytes: 0 %
LYMPHS PCT: 40 %
Lymphs Abs: 2.4 10*3/uL (ref 0.7–4.0)
MCH: 29.1 pg (ref 26.0–34.0)
MCHC: 31.3 g/dL (ref 30.0–36.0)
MCV: 93 fL (ref 80.0–100.0)
Monocytes Absolute: 0.5 10*3/uL (ref 0.1–1.0)
Monocytes Relative: 8 %
NEUTROS ABS: 2.9 10*3/uL (ref 1.7–7.7)
NEUTROS PCT: 49 %
Platelet Count: 155 10*3/uL (ref 150–400)
RBC: 4.16 MIL/uL — ABNORMAL LOW (ref 4.22–5.81)
RDW: 13.1 % (ref 11.5–15.5)
WBC Count: 6 10*3/uL (ref 4.0–10.5)
nRBC: 0 % (ref 0.0–0.2)

## 2018-11-17 LAB — TSH: TSH: 0.03 u[IU]/mL — ABNORMAL LOW (ref 0.35–4.50)

## 2018-11-17 LAB — T4, FREE: Free T4: 1.09 ng/dL (ref 0.60–1.60)

## 2018-11-17 MED ORDER — METHIMAZOLE 10 MG PO TABS: 10.0000 mg | ORAL_TABLET | Freq: Every day | ORAL | 6 refills | Status: DC

## 2018-11-17 NOTE — Progress Notes (Signed)
Hematology and Oncology Follow Up Visit  Jesus Lam [id number] 20-Aug-1933 84 y.o. 11/17/2018 3:16 PM Minette Brine, FNPLong, Scott, PA-C   Principle Diagnosis: 84 year old man with anemia of chronic renal insufficiency noted in 2014.  His hemoglobin have ranged between 11 and 12 without any indication for treatment.   Current therapy: Active surveillance.  Interim History: [name] returns today for a repeat evaluation.  Since the last visit, he reports no major complaints.  He remains ambulatory without any recent falls or syncope and does use a cane most of the time.  He denies any excessive fatigue, tiredness or dyspnea exertion.  He denies any hematochezia or melena.  His form status quality of life remains unchanged.  He does not report any headaches, blurry vision, syncope or seizures.  He denies any alteration mental status or confusion.  Does not report any fevers, chills or sweats.  Does not report any cough, wheezing or hemoptysis.  Does not report any chest pain, palpitation, orthopnea or leg edema.  Does not report any nausea, vomiting or abdominal pain.  Does not report any changes in bowel habits. Does not report any paralysis or myalgias.   Does not report frequency, urgency or hematuria.  Does not report any bleeding or clotting tendency.  Denies any changes in mood. Remaining review of systems is negative.    Medications: I have reviewed the patient's current medications.  Current Outpatient Medications  Medication Sig Dispense Refill  . amLODipine (NORVASC) 5 MG tablet Take 5 mg by mouth daily.    Marland Kitchen atorvastatin (LIPITOR) 80 MG tablet Take 1 tablet (80 mg total) by mouth daily. 90 tablet 0  . bisoprolol (ZEBETA) 5 MG tablet Take 5 mg by mouth daily.    . calamine lotion as needed.     . chlorthalidone (HYGROTON) 25 MG tablet Take 25 mg by mouth daily.    . Cholecalciferol (VITAMIN D3) 5000 units CAPS Take 5,000 Units by mouth daily.    . clopidogrel (PLAVIX) 75 MG  tablet Take 75 mg by mouth daily. Take 1 tablet PO daily for CVA and PAD    . colchicine 0.6 MG tablet Take 1 tab daily until 48 hours after resolution of symptoms, or up to 7 days. 7 tablet 0  . Elastic Bandages & Supports (MEDICAL COMPRESSION STOCKINGS) MISC     . furosemide (LASIX) 20 MG tablet Take 20 mg by mouth daily.  0  . gabapentin (NEURONTIN) 300 MG capsule Take 300 mg by mouth at bedtime.    . isosorbide-hydrALAZINE (BIDIL) 20-37.5 MG tablet Take by mouth.     Marland Kitchen ketorolac (ACULAR) 0.5 % ophthalmic solution ketorolac 0.5 % eye drops    . Menthol-Methyl Salicylate (MUSCLE RUB) 10-15 % CREA muscle rub    . methimazole (TAPAZOLE) 10 MG tablet Take 1 tablet (10 mg total) by mouth daily. 30 tablet 6  . Misc. Devices (RAISED TOILET SEAT/LOCK & ARMS) MISC raised toilet seat    . Misc. Devices (RUBBER BATH MAT) MISC bath mat    . Multiple Vitamin (MULTIVITAMIN) capsule Take 1 capsule daily by mouth.    . Multiple Vitamins-Minerals (COMPLETE SENIOR PO) 1 tablet daily.     . Omega-3 Fatty Acids (FISH OIL PO) Take 300 mg by mouth.    . tamsulosin (FLOMAX) 0.4 MG CAPS capsule Take 1 capsule (0.4 mg total) by mouth daily after supper. 30 capsule 1  . tobramycin (TOBREX) 0.3 % ophthalmic solution tobramycin 0.3 % eye drops    .  vitamin C (ASCORBIC ACID) 500 MG tablet Take 500 mg daily by mouth.     No current facility-administered medications for this visit.      Allergies:  Allergies  Allergen Reactions  . Shellfish Allergy Other (See Comments)    Gout     Past Medical History, Surgical history, Social history, and Family History were reviewed and updated.    Physical Exam: Blood pressure (!) 144/71, pulse 90, temperature 98.3 F (36.8 C), temperature source Oral, resp. rate 18, height 6' (1.829 m), weight 245 lb 1.6 oz (111.2 kg), SpO2 98 %. ECOG: 1    General appearance: Comfortable appearing without any discomfort Head: Normocephalic without any trauma Oropharynx: Mucous  membranes are moist and pink without any thrush or ulcers. Eyes: Pupils are equal and round reactive to light. Lymph nodes: No cervical, supraclavicular, inguinal or axillary lymphadenopathy.   Heart:regular rate and rhythm.  S1 and S2 without leg edema. Lung: Clear without any rhonchi or wheezes.  No dullness to percussion. Abdomin: Soft, nontender, nondistended with good bowel sounds.  No hepatosplenomegaly. Musculoskeletal: No joint deformity or effusion.  Full range of motion noted. Neurological: No deficits noted on motor, sensory and deep tendon reflex exam. Skin: No petechial rash or dryness.  Appeared moist.     Lab Results: Lab Results  Component Value Date   WBC 6.0 11/17/2018   HGB 12.1 (L) 11/17/2018   HCT 38.7 (L) 11/17/2018   MCV 93.0 11/17/2018   PLT 155 11/17/2018     Chemistry      Component Value Date/Time   NA 144 10/29/2018 0941   K 4.5 10/29/2018 0941   CL 105 10/29/2018 0941   CO2 24 10/29/2018 0941   BUN 15 10/29/2018 0941   CREATININE 1.43 (H) 10/29/2018 0941      Component Value Date/Time   CALCIUM 9.4 10/29/2018 0941   ALKPHOS 83 10/29/2018 0941   AST 21 10/29/2018 0941   ALT 16 10/29/2018 0941   BILITOT 0.3 10/29/2018 0941       Impression and Plan:  84 year old man with  1.  Anemia: His hemoglobin have ranged between 11 and 12 since 2014 and has been asymptomatic.  His hemoglobin today is 12.1 with normal parameters otherwise.     The etiology of his anemia appears to be multifactorial including chronic disease, renal insufficiency without any evidence of up primary hematological condition.  At this time, I see no need for any intervention with hemoglobin close to normal range.  I will consider obtaining bone marrow biopsy in the future if his hemoglobin drops or he develops any other cytopenias.  2.  Follow-up: I am happy to see him in the future as needed.  15  minutes was spent with the patient face-to-face today.  More than 50% of  time was dedicated to reviewing laboratory data, the differential diagnosis and management options.   Zola Button, MD 12/24/20193:16 PM

## 2018-11-17 NOTE — Patient Instructions (Addendum)
We recommend that you follow these hyperthyroidism instructions at home:  1) Take Methimazole 10 mg once a day  If you develop severe sore throat with high fevers OR develop unexplained yellowing of your skin, eyes, under your tongue, severe abdominal pain with nausea or vomiting --> then please get evaluated immediately.   2) Get repeat thyroid labs in 3 weeks    It is ESSENTIAL to get follow-up labs to help avoid over or undertreatment of your hyperthyroidism - both of which can be dangerous to your health.   

## 2018-11-17 NOTE — Progress Notes (Signed)
Name: Jesus Lam  MRN/ DOB: [id number], [birth year]    Age/ Sex: 84 y.o., male    PCP: [name], FNP   Reason for Endocrinology Evaluation: Low TSH      Date of Initial Endocrinology Evaluation: 11/17/2018     HPI: [name] is a 84 y.o. male with a past medical history of HTN, PAD and renal insufficiency. The patient presented for initial endocrinology clinic visit on 11/17/2018 for consultative assistance with his low TSH.   Pt has been noted to have low TSH during routine work up.   Pt denies any hyperthyroid symptoms such as weight loss, diarrhea, heat intolerance or anxiety. Has occasional tremors and palpitations.   Denies osteoporosis or bone fracture , denies prior hx of cardiac arrhythmia.   Denies any local neck symptoms No prior exposure to radiation   No FH of thyroid disease     HISTORY:  Past Medical History:  Past Medical History:  Diagnosis Date  . Anemia   . BPH (benign prostatic hyperplasia)   . Cataracts, bilateral   . DVT (deep venous thrombosis) (HCC)    dvt in left leg  . Dyslipidemia   . Gout   . Gout   . HTN (hypertension)   . Left leg pain   . Osteoarthritis   . PAD (peripheral artery disease) (HCC)   . Stasis dermatitis   . Stroke (HCC)   . Vitamin D deficiency    Past Surgical History:  Past Surgical History:  Procedure Laterality Date  . ABDOMINAL AORTOGRAM N/A 01/13/2018   Procedure: ABDOMINAL AORTOGRAM;  Surgeon: Elder Negus, MD;  Location: [facility];  Service: Cardiovascular;  Laterality: N/A;  . CATARACT EXTRACTION, BILATERAL    . LEFT HEART CATH AND CORONARY ANGIOGRAPHY N/A 01/13/2018   Procedure: LEFT HEART CATH AND CORONARY ANGIOGRAPHY;  Surgeon: Elder Negus, MD;  Location: [facility];  Service: Cardiovascular;  Laterality: N/A;  . LOWER EXTREMITY ANGIOGRAPHY N/A 01/13/2018   Procedure: LOWER EXTREMITY ANGIOGRAPHY;  Surgeon: Elder Negus, MD;  Location: [facility];  Service: Cardiovascular;  Laterality: N/A;  . LOWER EXTREMITY ANGIOGRAPHY Right 01/27/2018   Procedure: LOWER EXTREMITY ANGIOGRAPHY;  Surgeon: Yates Decamp, MD;  Location: [facility];  Service: Cardiovascular;  Laterality: Right;  . PERIPHERAL VASCULAR ATHERECTOMY Left 01/13/2018   Procedure: PERIPHERAL VASCULAR ATHERECTOMY;  Surgeon: Elder Negus, MD;  Location: [facility];  Service: Cardiovascular;  Laterality: Left;  SFA WITH PTA DRUG COATED BALLOON  . PERIPHERAL VASCULAR INTERVENTION  01/27/2018   Procedure: PERIPHERAL VASCULAR INTERVENTION;  Surgeon: Yates Decamp, MD;  Location: [facility];  Service: Cardiovascular;;      Social History:  reports that he has never smoked. He has never used smokeless tobacco. He reports current alcohol use of about 1.0 standard drinks of alcohol per week. He reports current drug use. Drug: Marijuana.  Family History: family history includes Alcohol abuse in his father; Breast cancer in his daughter; CVA in his daughter; Cancer in his father and mother; Stroke in his daughter.   HOME MEDICATIONS: Current Outpatient Medications on File Prior to Visit  Medication Sig Dispense Refill  . amLODipine (NORVASC) 5 MG tablet Take 5 mg by mouth daily.    Marland Kitchen atorvastatin (LIPITOR) 80 MG tablet Take 1 tablet (80 mg total) by mouth daily. 90 tablet 0  . bisoprolol (ZEBETA) 5 MG tablet Take 5 mg by mouth daily.    Marland Kitchen  calamine lotion as needed.     . chlorthalidone (HYGROTON) 25 MG tablet Take 25 mg by mouth daily.    . Cholecalciferol (VITAMIN D3) 5000 units CAPS Take 5,000 Units by mouth daily.    . colchicine 0.6 MG tablet Take 1 tab daily until 48 hours after resolution of symptoms, or up to 7 days. 7 tablet 0  . Elastic Bandages & Supports (MEDICAL COMPRESSION STOCKINGS) MISC     . furosemide (LASIX) 20 MG tablet Take 20 mg by mouth daily.  0  . gabapentin (NEURONTIN) 300 MG capsule Take 300 mg by mouth at bedtime.    Marland Kitchen ketorolac  (ACULAR) 0.5 % ophthalmic solution ketorolac 0.5 % eye drops    . Menthol-Methyl Salicylate (MUSCLE RUB) 10-15 % CREA muscle rub    . Misc. Devices (RAISED TOILET SEAT/LOCK & ARMS) MISC raised toilet seat    . Misc. Devices (RUBBER BATH MAT) MISC bath mat    . Multiple Vitamin (MULTIVITAMIN) capsule Take 1 capsule daily by mouth.    . Multiple Vitamins-Minerals (COMPLETE SENIOR PO) 1 tablet daily.     . tamsulosin (FLOMAX) 0.4 MG CAPS capsule Take 1 capsule (0.4 mg total) by mouth daily after supper. 30 capsule 1  . tobramycin (TOBREX) 0.3 % ophthalmic solution tobramycin 0.3 % eye drops    . vitamin C (ASCORBIC ACID) 500 MG tablet Take 500 mg daily by mouth.    . Omega-3 Fatty Acids (FISH OIL PO) Take 300 mg by mouth.     No current facility-administered medications on file prior to visit.       REVIEW OF SYSTEMS: A comprehensive ROS was conducted with the patient and is negative except as per HPI and below:  Review of Systems  Constitutional: Negative for malaise/fatigue and weight loss.  HENT: Negative for congestion and sore throat.   Eyes: Negative for blurred vision and pain.  Respiratory: Negative for cough and shortness of breath.   Cardiovascular: Positive for palpitations. Negative for chest pain.  Gastrointestinal: Negative for diarrhea and nausea.  Genitourinary: Positive for frequency.  Musculoskeletal: Negative for falls.  Skin: Negative.   Neurological: Positive for tremors. Negative for tingling.  Endo/Heme/Allergies: Negative for polydipsia.  Psychiatric/Behavioral: Negative for depression. The patient is not nervous/anxious.        OBJECTIVE:  VS: BP (!) 148/78 (BP Location: Left Arm, Patient Position: Sitting, Cuff Size: Large)   Pulse 70   Ht 6' (1.829 m)   Wt 245 lb (111.1 kg)   SpO2 96%   BMI 33.23 kg/m    Wt Readings from Last 3 Encounters:  11/17/18 245 lb (111.1 kg)  10/29/18 247 lb 3.2 oz (112.1 kg)  08/06/18 230 lb (104.3 kg)      EXAM: General: Pt appears well and is in NAD  Hydration: Well-hydrated with moist mucous membranes and good skin turgor  Eyes: External eye exam normal without stare, lid lag or exophthalmos.  EOM intact.  PERRL.  Ears, Nose, Throat: Hearing: Grossly intact bilaterally Throat: Clear without mass, erythema or exudate  Neck: General: Supple without adenopathy. Thyroid: Thyroid size normal.  No goiter appreciated. No thyroid bruit.? Left thyroid nodule.   Lungs: Clear with good BS bilat with no rales, rhonchi, or wheezes  Heart: Auscultation: RRR.  Abdomen: Normoactive bowel sounds, soft, nontender, without masses or organomegaly palpable  Extremities:  BL LE: No pretibial edema normal ROM and strength.  Skin: Hair: Texture and amount normal with gender appropriate distribution Skin Inspection: No rashes.  Skin Palpation: Skin temperature, texture, and thickness normal to palpation  Neuro: Cranial nerves: II - XII grossly intact  Motor: Normal strength throughout DTRs: 2+ and symmetric in UE without delay in relaxation phase  Mental Status: Judgment, insight: Intact Orientation: Oriented to time, place, and person Mood and affect: No depression, anxiety, or agitation     DATA REVIEWED:   Results for TRENELL, CONCANNON (MRN [id number]) as of 11/17/2018 09:29  Ref. Range 10/29/2018 09:41 11/06/2018 08:51  TSH Latest Ref Range: 0.450 - 4.500 uIU/mL 0.018 (L) 0.047 (L)  Triiodothyronine,Free,Serum Latest Ref Range: 2.0 - 4.4 pg/mL 3.2   T4,Free(Direct) Latest Ref Range: 0.82 - 1.77 ng/dL 5.62     ASSESSMENT/PLAN/RECOMMENDATIONS:   1. Subclinical Hyperthyroidism : - The causes of subclinical hyperthyroidism are the same as the causes of overt hyperthyroidism, and like overt hyperthyroidism, subclinical hyperthyroidism can be persistent or transient. Common causes of subclinical hyperthyroidism include excessive thyroid hormone therapy (exogenous subclinical hyperthyroidism),  autonomously functioning thyroid adenomas and multinodular goiters (endogenous subclinical hyperthyroidism), or Graves' disease (endogenous subclinical hyperthyroidism).   Most patients with subclinical hyperthyroidism have no clinical manifestations of hyperthyroidism, and those symptoms that are present (eg, tachycardia, tremor, dyspnea on exertion, weight loss) are mild and nonspecific." However, subclinical hyperthyroidism is associated with an increased risk of atrial fibrillation and, a decrease in bone mineral density, hence treatment is recommended at this time.  - I will proceed with checking his TRAb levels, if these come back negative, will consider thyroid uptake and scan, otherwise will proceed with thionamide therapay.   - We discussed with pt the benefits of methimazole in the Tx of hyperthyroidism, as well as the possible side effects/complications of anti-thyroid drug Tx (specifically detailing the rare, but serious side effect of agranulocytosis). He was informed of need for regular thyroid function monitoring while on methimazole to ensure appropriate dosage without over-treatment. As well, we discussed the possible side effects of methimazole including the chance of rash, the small chance of liver irritation/juandice and the <=1 in 300-400 chance of sudden onset agranulocytosis.  We discussed importance of going to ED promptly (and stopping methimazole) if hewere to develop significant fever with severe sore throat of other evidence of acute infection.       Medications : Methimazole 10 mg daily  Repeat labs in 3 weeks   Discussed the case with pt's daughter over the phone (regina Sturkey) per father's request.    F/u in 6 weeks    Signed electronically by: Lyndle Herrlich, MD  [facility]  [facility] [address] [place] [place], [place] [address] Phone: [phone] FAX: [phone]   CC: [name], FNP [address]  [address] [place] [address] Phone: [phone] Fax: [phone]   Return to Endocrinology clinic as below: Future Appointments  Date Time Provider Department Center  11/17/2018  3:00 PM [facility] None  11/17/2018  3:30 PM Benjiman Core, MD [facility] None  01/28/2019  2:15 PM Rodriguez-Southworth, Nettie Elm, PA-C [facility] None

## 2018-11-18 DIAGNOSIS — E0590 Thyrotoxicosis, unspecified without thyrotoxic crisis or storm: Secondary | ICD-10-CM | POA: Insufficient documentation

## 2018-11-18 DIAGNOSIS — I639 Cerebral infarction, unspecified: Secondary | ICD-10-CM | POA: Diagnosis not present

## 2018-11-19 DIAGNOSIS — M25462 Effusion, left knee: Secondary | ICD-10-CM | POA: Diagnosis not present

## 2018-11-19 DIAGNOSIS — I639 Cerebral infarction, unspecified: Secondary | ICD-10-CM | POA: Diagnosis not present

## 2018-11-19 DIAGNOSIS — R269 Unspecified abnormalities of gait and mobility: Secondary | ICD-10-CM | POA: Diagnosis not present

## 2018-11-19 DIAGNOSIS — M25562 Pain in left knee: Secondary | ICD-10-CM | POA: Diagnosis not present

## 2018-11-19 DIAGNOSIS — M25662 Stiffness of left knee, not elsewhere classified: Secondary | ICD-10-CM | POA: Diagnosis not present

## 2018-11-19 DIAGNOSIS — R6889 Other general symptoms and signs: Secondary | ICD-10-CM | POA: Diagnosis not present

## 2018-11-19 LAB — T3: T3, Total: 141 ng/dL (ref 76–181)

## 2018-11-19 LAB — IRON AND TIBC
IRON: 56 ug/dL (ref 42–163)
Saturation Ratios: 21 % (ref 20–55)
TIBC: 260 ug/dL (ref 202–409)
UIBC: 205 ug/dL (ref 117–376)

## 2018-11-19 LAB — FERRITIN: Ferritin: 332 ng/mL (ref 24–336)

## 2018-11-19 LAB — THYROTROPIN RECEPTOR AUTOABS: Thyrotropin Receptor Ab: <6 % (ref <=16.0)

## 2018-11-20 ENCOUNTER — Telehealth: Payer: Self-pay

## 2018-11-20 DIAGNOSIS — R6889 Other general symptoms and signs: Secondary | ICD-10-CM | POA: Diagnosis not present

## 2018-11-20 DIAGNOSIS — I639 Cerebral infarction, unspecified: Secondary | ICD-10-CM | POA: Diagnosis not present

## 2018-11-20 NOTE — Telephone Encounter (Signed)
Per 12/24 no los 

## 2018-11-23 DIAGNOSIS — R6889 Other general symptoms and signs: Secondary | ICD-10-CM | POA: Diagnosis not present

## 2018-11-23 DIAGNOSIS — I639 Cerebral infarction, unspecified: Secondary | ICD-10-CM | POA: Diagnosis not present

## 2018-11-24 DIAGNOSIS — M25462 Effusion, left knee: Secondary | ICD-10-CM | POA: Diagnosis not present

## 2018-11-24 DIAGNOSIS — M25662 Stiffness of left knee, not elsewhere classified: Secondary | ICD-10-CM | POA: Diagnosis not present

## 2018-11-24 DIAGNOSIS — R6889 Other general symptoms and signs: Secondary | ICD-10-CM | POA: Diagnosis not present

## 2018-11-24 DIAGNOSIS — M25562 Pain in left knee: Secondary | ICD-10-CM | POA: Diagnosis not present

## 2018-11-24 DIAGNOSIS — I639 Cerebral infarction, unspecified: Secondary | ICD-10-CM | POA: Diagnosis not present

## 2018-11-24 DIAGNOSIS — R269 Unspecified abnormalities of gait and mobility: Secondary | ICD-10-CM | POA: Diagnosis not present

## 2018-11-25 DIAGNOSIS — I639 Cerebral infarction, unspecified: Secondary | ICD-10-CM | POA: Diagnosis not present

## 2018-11-26 DIAGNOSIS — I639 Cerebral infarction, unspecified: Secondary | ICD-10-CM | POA: Diagnosis not present

## 2018-11-26 DIAGNOSIS — M25462 Effusion, left knee: Secondary | ICD-10-CM | POA: Diagnosis not present

## 2018-11-26 DIAGNOSIS — M25662 Stiffness of left knee, not elsewhere classified: Secondary | ICD-10-CM | POA: Diagnosis not present

## 2018-11-26 DIAGNOSIS — R6889 Other general symptoms and signs: Secondary | ICD-10-CM | POA: Diagnosis not present

## 2018-11-26 DIAGNOSIS — M25562 Pain in left knee: Secondary | ICD-10-CM | POA: Diagnosis not present

## 2018-11-26 DIAGNOSIS — R269 Unspecified abnormalities of gait and mobility: Secondary | ICD-10-CM | POA: Diagnosis not present

## 2018-11-27 ENCOUNTER — Other Ambulatory Visit: Payer: Self-pay | Admitting: Nurse Practitioner

## 2018-11-27 DIAGNOSIS — I639 Cerebral infarction, unspecified: Secondary | ICD-10-CM | POA: Diagnosis not present

## 2018-11-30 DIAGNOSIS — I639 Cerebral infarction, unspecified: Secondary | ICD-10-CM | POA: Diagnosis not present

## 2018-12-01 DIAGNOSIS — I639 Cerebral infarction, unspecified: Secondary | ICD-10-CM | POA: Diagnosis not present

## 2018-12-01 DIAGNOSIS — R6889 Other general symptoms and signs: Secondary | ICD-10-CM | POA: Diagnosis not present

## 2018-12-01 DIAGNOSIS — R269 Unspecified abnormalities of gait and mobility: Secondary | ICD-10-CM | POA: Diagnosis not present

## 2018-12-01 DIAGNOSIS — M25562 Pain in left knee: Secondary | ICD-10-CM | POA: Diagnosis not present

## 2018-12-01 DIAGNOSIS — M25662 Stiffness of left knee, not elsewhere classified: Secondary | ICD-10-CM | POA: Diagnosis not present

## 2018-12-01 DIAGNOSIS — M25462 Effusion, left knee: Secondary | ICD-10-CM | POA: Diagnosis not present

## 2018-12-02 DIAGNOSIS — I639 Cerebral infarction, unspecified: Secondary | ICD-10-CM | POA: Diagnosis not present

## 2018-12-03 DIAGNOSIS — R6889 Other general symptoms and signs: Secondary | ICD-10-CM | POA: Diagnosis not present

## 2018-12-03 DIAGNOSIS — I639 Cerebral infarction, unspecified: Secondary | ICD-10-CM | POA: Diagnosis not present

## 2018-12-04 ENCOUNTER — Other Ambulatory Visit: Payer: Self-pay | Admitting: Internal Medicine

## 2018-12-04 DIAGNOSIS — I639 Cerebral infarction, unspecified: Secondary | ICD-10-CM | POA: Diagnosis not present

## 2018-12-04 DIAGNOSIS — R7989 Other specified abnormal findings of blood chemistry: Secondary | ICD-10-CM

## 2018-12-04 DIAGNOSIS — E0590 Thyrotoxicosis, unspecified without thyrotoxic crisis or storm: Secondary | ICD-10-CM

## 2018-12-07 DIAGNOSIS — R6889 Other general symptoms and signs: Secondary | ICD-10-CM | POA: Diagnosis not present

## 2018-12-07 DIAGNOSIS — I639 Cerebral infarction, unspecified: Secondary | ICD-10-CM | POA: Diagnosis not present

## 2018-12-08 ENCOUNTER — Other Ambulatory Visit: Payer: Medicare HMO

## 2018-12-08 DIAGNOSIS — I639 Cerebral infarction, unspecified: Secondary | ICD-10-CM | POA: Diagnosis not present

## 2018-12-09 DIAGNOSIS — R6889 Other general symptoms and signs: Secondary | ICD-10-CM | POA: Diagnosis not present

## 2018-12-09 DIAGNOSIS — I639 Cerebral infarction, unspecified: Secondary | ICD-10-CM | POA: Diagnosis not present

## 2018-12-10 DIAGNOSIS — M25662 Stiffness of left knee, not elsewhere classified: Secondary | ICD-10-CM | POA: Diagnosis not present

## 2018-12-10 DIAGNOSIS — M25562 Pain in left knee: Secondary | ICD-10-CM | POA: Diagnosis not present

## 2018-12-10 DIAGNOSIS — R269 Unspecified abnormalities of gait and mobility: Secondary | ICD-10-CM | POA: Diagnosis not present

## 2018-12-10 DIAGNOSIS — I639 Cerebral infarction, unspecified: Secondary | ICD-10-CM | POA: Diagnosis not present

## 2018-12-10 DIAGNOSIS — M25462 Effusion, left knee: Secondary | ICD-10-CM | POA: Diagnosis not present

## 2018-12-10 DIAGNOSIS — R6889 Other general symptoms and signs: Secondary | ICD-10-CM | POA: Diagnosis not present

## 2018-12-11 DIAGNOSIS — M25662 Stiffness of left knee, not elsewhere classified: Secondary | ICD-10-CM | POA: Diagnosis not present

## 2018-12-11 DIAGNOSIS — M25462 Effusion, left knee: Secondary | ICD-10-CM | POA: Diagnosis not present

## 2018-12-11 DIAGNOSIS — R269 Unspecified abnormalities of gait and mobility: Secondary | ICD-10-CM | POA: Diagnosis not present

## 2018-12-11 DIAGNOSIS — R6889 Other general symptoms and signs: Secondary | ICD-10-CM | POA: Diagnosis not present

## 2018-12-11 DIAGNOSIS — I639 Cerebral infarction, unspecified: Secondary | ICD-10-CM | POA: Diagnosis not present

## 2018-12-11 DIAGNOSIS — M25562 Pain in left knee: Secondary | ICD-10-CM | POA: Diagnosis not present

## 2018-12-14 ENCOUNTER — Other Ambulatory Visit (INDEPENDENT_AMBULATORY_CARE_PROVIDER_SITE_OTHER): Payer: Medicare HMO

## 2018-12-14 DIAGNOSIS — E0590 Thyrotoxicosis, unspecified without thyrotoxic crisis or storm: Secondary | ICD-10-CM | POA: Diagnosis not present

## 2018-12-14 DIAGNOSIS — I639 Cerebral infarction, unspecified: Secondary | ICD-10-CM | POA: Diagnosis not present

## 2018-12-14 DIAGNOSIS — R6889 Other general symptoms and signs: Secondary | ICD-10-CM | POA: Diagnosis not present

## 2018-12-14 LAB — TSH

## 2018-12-14 LAB — T4, FREE: Free T4: 1 ng/dL (ref 0.60–1.60)

## 2018-12-15 DIAGNOSIS — R269 Unspecified abnormalities of gait and mobility: Secondary | ICD-10-CM | POA: Diagnosis not present

## 2018-12-15 DIAGNOSIS — R6889 Other general symptoms and signs: Secondary | ICD-10-CM | POA: Diagnosis not present

## 2018-12-15 DIAGNOSIS — M25662 Stiffness of left knee, not elsewhere classified: Secondary | ICD-10-CM | POA: Diagnosis not present

## 2018-12-15 DIAGNOSIS — M25462 Effusion, left knee: Secondary | ICD-10-CM | POA: Diagnosis not present

## 2018-12-15 DIAGNOSIS — I639 Cerebral infarction, unspecified: Secondary | ICD-10-CM | POA: Diagnosis not present

## 2018-12-15 DIAGNOSIS — M25562 Pain in left knee: Secondary | ICD-10-CM | POA: Diagnosis not present

## 2018-12-16 DIAGNOSIS — I639 Cerebral infarction, unspecified: Secondary | ICD-10-CM | POA: Diagnosis not present

## 2018-12-16 DIAGNOSIS — R6889 Other general symptoms and signs: Secondary | ICD-10-CM | POA: Diagnosis not present

## 2018-12-17 DIAGNOSIS — I639 Cerebral infarction, unspecified: Secondary | ICD-10-CM | POA: Diagnosis not present

## 2018-12-18 DIAGNOSIS — R6889 Other general symptoms and signs: Secondary | ICD-10-CM | POA: Diagnosis not present

## 2018-12-21 DIAGNOSIS — R6889 Other general symptoms and signs: Secondary | ICD-10-CM | POA: Diagnosis not present

## 2018-12-22 DIAGNOSIS — R6889 Other general symptoms and signs: Secondary | ICD-10-CM | POA: Diagnosis not present

## 2018-12-22 DIAGNOSIS — M25462 Effusion, left knee: Secondary | ICD-10-CM | POA: Diagnosis not present

## 2018-12-22 DIAGNOSIS — R269 Unspecified abnormalities of gait and mobility: Secondary | ICD-10-CM | POA: Diagnosis not present

## 2018-12-22 DIAGNOSIS — M25562 Pain in left knee: Secondary | ICD-10-CM | POA: Diagnosis not present

## 2018-12-22 DIAGNOSIS — M25662 Stiffness of left knee, not elsewhere classified: Secondary | ICD-10-CM | POA: Diagnosis not present

## 2018-12-23 DIAGNOSIS — R6889 Other general symptoms and signs: Secondary | ICD-10-CM | POA: Diagnosis not present

## 2018-12-24 DIAGNOSIS — R6889 Other general symptoms and signs: Secondary | ICD-10-CM | POA: Diagnosis not present

## 2018-12-24 DIAGNOSIS — M25562 Pain in left knee: Secondary | ICD-10-CM | POA: Diagnosis not present

## 2018-12-24 DIAGNOSIS — R269 Unspecified abnormalities of gait and mobility: Secondary | ICD-10-CM | POA: Diagnosis not present

## 2018-12-24 DIAGNOSIS — M25462 Effusion, left knee: Secondary | ICD-10-CM | POA: Diagnosis not present

## 2018-12-24 DIAGNOSIS — M25662 Stiffness of left knee, not elsewhere classified: Secondary | ICD-10-CM | POA: Diagnosis not present

## 2018-12-25 DIAGNOSIS — I639 Cerebral infarction, unspecified: Secondary | ICD-10-CM | POA: Diagnosis not present

## 2018-12-28 DIAGNOSIS — R6889 Other general symptoms and signs: Secondary | ICD-10-CM | POA: Diagnosis not present

## 2018-12-28 DIAGNOSIS — I639 Cerebral infarction, unspecified: Secondary | ICD-10-CM | POA: Diagnosis not present

## 2018-12-29 DIAGNOSIS — I639 Cerebral infarction, unspecified: Secondary | ICD-10-CM | POA: Diagnosis not present

## 2018-12-30 DIAGNOSIS — I639 Cerebral infarction, unspecified: Secondary | ICD-10-CM | POA: Diagnosis not present

## 2018-12-30 DIAGNOSIS — R6889 Other general symptoms and signs: Secondary | ICD-10-CM | POA: Diagnosis not present

## 2018-12-31 ENCOUNTER — Encounter: Payer: Self-pay | Admitting: Internal Medicine

## 2018-12-31 ENCOUNTER — Ambulatory Visit (INDEPENDENT_AMBULATORY_CARE_PROVIDER_SITE_OTHER): Payer: Medicare HMO | Admitting: Internal Medicine

## 2018-12-31 VITALS — BP 142/64 | HR 56 | Ht 72.0 in | Wt 242.2 lb

## 2018-12-31 DIAGNOSIS — E0590 Thyrotoxicosis, unspecified without thyrotoxic crisis or storm: Secondary | ICD-10-CM

## 2018-12-31 DIAGNOSIS — M25662 Stiffness of left knee, not elsewhere classified: Secondary | ICD-10-CM | POA: Diagnosis not present

## 2018-12-31 DIAGNOSIS — R6889 Other general symptoms and signs: Secondary | ICD-10-CM | POA: Diagnosis not present

## 2018-12-31 DIAGNOSIS — I639 Cerebral infarction, unspecified: Secondary | ICD-10-CM | POA: Diagnosis not present

## 2018-12-31 DIAGNOSIS — M25462 Effusion, left knee: Secondary | ICD-10-CM | POA: Diagnosis not present

## 2018-12-31 DIAGNOSIS — M25562 Pain in left knee: Secondary | ICD-10-CM | POA: Diagnosis not present

## 2018-12-31 DIAGNOSIS — R269 Unspecified abnormalities of gait and mobility: Secondary | ICD-10-CM | POA: Diagnosis not present

## 2018-12-31 MED ORDER — METHIMAZOLE 10 MG PO TABS: 10.0000 mg | ORAL_TABLET | Freq: Every day | ORAL | 6 refills | Status: DC

## 2018-12-31 NOTE — Patient Instructions (Signed)
-   Your thyroid is a little overactive, this predisposes you to cardiac  arrhthymias (abnormal heart rhythm) and brittle bones. Its very important that you take Methimazole one tablet daily.  - Once you start the pill, you will need to have another blood work in 3 weeks.

## 2018-12-31 NOTE — Progress Notes (Signed)
Name: Jesus Lam  MRN/ DOB: [id number], 11/20/1933    Age/ Sex: 85 y.o., male     PCP: Arnette FeltsMoore, Janece, FNP   Reason for Endocrinology Evaluation: Low TSH      Initial Endocrinology Clinic Visit: 11/17/18    PATIENT IDENTIFIER: Jesus Lam is a 85 y.o., male with a past medical history of HTN, PAD and renal insufficiency. He has followed with [facility] since 11/17/18 for consultative assistance with management of his Low TSH   HISTORICAL SUMMARY: The patient was first to have low TSH during routine work up on 10/29/2018. He did not have any hyperthyroid symptoms at the time.   No Hx of osteoporosis, bone fracture or cardiac arrhythmia.  He was started on Methimazole 10 mg daily in December, 2019  SUBJECTIVE:    Today (12/31/2018):  Jesus Lam is here for 6-week follow-up on subclinical hyperthyroidism.  Today he denies any local neck symptoms, he denies any hyperthyroid symptoms such as weight loss diarrhea heat intolerance. He continues to have occasional palpitations and tremors.  He has not started his methimazole yet.  And clear the reason for that.  ROS:  As per HPI.   HISTORY:  Past Medical History:  Past Medical History:  Diagnosis Date  . Anemia   . BPH (benign prostatic hyperplasia)   . Cataracts, bilateral   . DVT (deep venous thrombosis) (HCC)    dvt in left leg  . Dyslipidemia   . Gout   . Gout   . HTN (hypertension)   . Left leg pain   . Osteoarthritis   . PAD (peripheral artery disease) (HCC)   . Stasis dermatitis   . Stroke (HCC)   . Vitamin D deficiency    Past Surgical History:  Past Surgical History:  Procedure Laterality Date  . ABDOMINAL AORTOGRAM N/A 01/13/2018   Procedure: ABDOMINAL AORTOGRAM;  Surgeon: Elder NegusPatwardhan, Manish J, MD;  [facility];  Service: Cardiovascular;  Laterality: N/A;  . CATARACT EXTRACTION, BILATERAL    . LEFT HEART CATH AND CORONARY ANGIOGRAPHY N/A 01/13/2018   Procedure:  LEFT HEART CATH AND CORONARY ANGIOGRAPHY;  Surgeon: Elder NegusPatwardhan, Manish J, MD;  [facility];  Service: Cardiovascular;  Laterality: N/A;  . LOWER EXTREMITY ANGIOGRAPHY N/A 01/13/2018   Procedure: LOWER EXTREMITY ANGIOGRAPHY;  Surgeon: Elder NegusPatwardhan, Manish J, MD;  [facility];  Service: Cardiovascular;  Laterality: N/A;  . LOWER EXTREMITY ANGIOGRAPHY Right 01/27/2018   Procedure: LOWER EXTREMITY ANGIOGRAPHY;  Surgeon: Yates DecampGanji, Jay, MD;  [facility];  Service: Cardiovascular;  Laterality: Right;  . PERIPHERAL VASCULAR ATHERECTOMY Left 01/13/2018   Procedure: PERIPHERAL VASCULAR ATHERECTOMY;  Surgeon: Elder NegusPatwardhan, Manish J, MD;  [facility];  Service: Cardiovascular;  Laterality: Left;  SFA WITH PTA DRUG COATED BALLOON  . PERIPHERAL VASCULAR INTERVENTION  01/27/2018   Procedure: PERIPHERAL VASCULAR INTERVENTION;  Surgeon: Yates DecampGanji, Jay, MD;  [facility];  Service: Cardiovascular;;    Social History:  reports that he has never smoked. He has never used smokeless tobacco. He reports current alcohol use of about 1.0 standard drinks of alcohol per week. He reports current drug use. Drug: Marijuana.  Family History: family history includes Alcohol abuse in his father; Breast cancer in his daughter; CVA in his daughter; Cancer in his father and mother; Stroke in his daughter.   HOME MEDICATIONS: Allergies as of 12/31/2018      Reactions   Shellfish Allergy Other (See  Comments)   Gout       Medication List       Accurate as of December 31, 2018  9:16 AM. Always use your most recent med list.        amLODipine 5 MG tablet Commonly known as:  NORVASC Take 5 mg by mouth daily.   atorvastatin 80 MG tablet Commonly known as:  LIPITOR Take 1 tablet (80 mg total) by mouth daily.   bisoprolol 5 MG tablet Commonly known as:  ZEBETA Take 5 mg by mouth daily.   calamine lotion as needed.   chlorthalidone 25 MG tablet Commonly  known as:  HYGROTON Take 25 mg by mouth daily.   clopidogrel 75 MG tablet Commonly known as:  PLAVIX Take 75 mg by mouth daily. Take 1 tablet PO daily for CVA and PAD   colchicine 0.6 MG tablet Take 1 tab daily until 48 hours after resolution of symptoms, or up to 7 days.   COMPLETE SENIOR PO 1 tablet daily.   FISH OIL PO Take 300 mg by mouth.   furosemide 20 MG tablet Commonly known as:  LASIX Take 20 mg by mouth daily.   gabapentin 300 MG capsule Commonly known as:  NEURONTIN Take 300 mg by mouth at bedtime.   isosorbide-hydrALAZINE 20-37.5 MG tablet Commonly known as:  BIDIL Take by mouth.   ketorolac 0.5 % ophthalmic solution Commonly known as:  ACULAR ketorolac 0.5 % eye drops   Medical Compression Stockings Misc   methimazole 10 MG tablet Commonly known as:  TAPAZOLE Take 1 tablet (10 mg total) by mouth daily.   multivitamin capsule Take 1 capsule daily by mouth.   MUSCLE RUB 10-15 % Crea muscle rub   Rubber Bath Mat Misc bath mat   RAISED TOILET SEAT/LOCK & ARMS Misc raised toilet seat   tamsulosin 0.4 MG Caps capsule Commonly known as:  FLOMAX TAKE ONE CAPSULE BY MOUTH DAILY . TAKE HALF HOUR FOLLOWING THE SAME MEAL DAILY   tobramycin 0.3 % ophthalmic solution Commonly known as:  TOBREX tobramycin 0.3 % eye drops   vitamin C 500 MG tablet Commonly known as:  ASCORBIC ACID Take 500 mg daily by mouth.   Vitamin D3 125 MCG (5000 UT) Caps Take 5,000 Units by mouth daily.         OBJECTIVE:   PHYSICAL EXAM: VS: BP (!) 142/64 (BP Location: Left Arm, Patient Position: Sitting, Cuff Size: Normal)   Pulse (!) 56   Ht 6' (1.829 m)   Wt 242 lb 3.2 oz (109.9 kg)   SpO2 93%   BMI 32.85 kg/m    EXAM: General: Pt appears well and is in NAD  Neck: General: Supple without adenopathy. Thyroid: Thyroid size normal.  No goiter or nodules appreciated. No thyroid bruit.  Lungs: Clear with good BS bilat with no rales, rhonchi, or wheezes  Heart:  Auscultation: Bradycardic with systolic murmur  Abdomen: Normoactive bowel sounds, soft, nontender, without masses or organomegaly palpable  Extremities:  BL LE: Trace pretibial edema normal ROM and strength.  Mental Status: Judgment, insight: Intact Orientation: Oriented to time, place, and person Mood and affect: No depression, anxiety, or agitation     DATA REVIEWED:  Results for KAYON, BRONER (MRN [id number]) as of 12/31/2018 13:35  Ref. Range 10/29/2018 09:41 11/06/2018 08:51 11/17/2018 09:55 12/14/2018 13:38  TSH Latest Ref Range: 0.35 - 4.50 uIU/mL 0.018 (L) 0.047 (L) 0.03 (L) <0.01 (L)  Triiodothyronine,Free,Serum Latest Ref Range: 2.0 - 4.4 pg/mL 3.2  Triiodothyronine (T3) Latest Ref Range: 76 - 181 ng/dL   811   S3,PRXY(VOPFYT) Latest Ref Range: 0.60 - 1.60 ng/dL 2.44  6.28 6.38    ASSESSMENT / PLAN / RECOMMENDATIONS:   1. Subclinical Hyperthyroidism    The causes of subclinical hyperthyroidism are the same as the causes of overt hyperthyroidism, and like overt hyperthyroidism, subclinical hyperthyroidism can be persistent or transient. Common causes of subclinical hyperthyroidism include excessive thyroid hormone therapy (exogenous subclinical hyperthyroidism), autonomously functioning thyroid adenomas and multinodular goiters (endogenous subclinical hyperthyroidism), or Graves' disease (endogenous subclinical hyperthyroidism).  Most patients with subclinical hyperthyroidism have no clinical manifestations of hyperthyroidism, and those symptoms that are present (eg, tachycardia, tremor, dyspnea on exertion, weight loss) are mild and nonspecific." However, subclinical hyperthyroidism is associated with an increased risk of atrial fibrillation and, a decrease in bone mineral density, hence treatment is recommended at this time.    - His TRAb levels are normal which indicated he could have an autonomous nodule (s) - Given his age and other comorbidities, the benefit of being  on a small dose of methimazole may outweigh the process of going through an uptake and scan followed by RAI ablation. - I will also consider discussing this with his daughter first, if current dose of methimazole is not beneficial.   Medications   Methimazole 10 mg daily   Labs in 3 weeks, F/U in 6 weeks    Signed electronically by: Lyndle Herrlich, MD  [facility]  [facility] [address] [place] [place], [place] [address] Phone: [phone] FAX: [phone]      CC: [name], FNP [address] [place] [address] Phone: [phone]  Fax: [phone]   Return to Endocrinology clinic as below: Future Appointments  Date Time Provider Department Center  12/31/2018  9:30 AM Philipp Callegari, Konrad Dolores, MD [facility] None  01/04/2019 12:00 PM Patwardhan, Anabel Bene, MD [facility] None  01/28/2019  2:15 PM Rodriguez-Southworth, Nettie Elm, PA-C [facility] None  01/28/2019  2:45 PM [facility] None

## 2019-01-01 DIAGNOSIS — M25662 Stiffness of left knee, not elsewhere classified: Secondary | ICD-10-CM | POA: Diagnosis not present

## 2019-01-01 DIAGNOSIS — I639 Cerebral infarction, unspecified: Secondary | ICD-10-CM | POA: Diagnosis not present

## 2019-01-01 DIAGNOSIS — M25562 Pain in left knee: Secondary | ICD-10-CM | POA: Diagnosis not present

## 2019-01-01 DIAGNOSIS — R269 Unspecified abnormalities of gait and mobility: Secondary | ICD-10-CM | POA: Diagnosis not present

## 2019-01-01 DIAGNOSIS — R6889 Other general symptoms and signs: Secondary | ICD-10-CM | POA: Diagnosis not present

## 2019-01-01 DIAGNOSIS — M25462 Effusion, left knee: Secondary | ICD-10-CM | POA: Diagnosis not present

## 2019-01-01 NOTE — Progress Notes (Signed)
Patient is here for follow up visit.  Subjective:    ID: Jesus Lam, male    DOB: January 05, 1933, 85 y.o.   MRN: [id number]  Chief Complaint  Patient presents with  . Follow-up    66mo    HPI  85 year old African-American male with hypertension, PAD s/p successful DCB Rt SFA and 2 overlapping Zilver PTX stents placed left SFA (12/2017), h/o left temporal occipital cortical infarct (05/2018).  I have been concerned regarding polypharmacy, medication duplications, and patient's inability to manage his medications. At last visit, I stopped metoprolol succinate and carevdilol, and continued bisoprolol 5 mg daily. I also informed [facility] from where he got all these medications. I also recommended home health to help him manage his medications.   His workup with event monitor did not show any atrial fibilation in 07/2018. Echocardiogram in 06/2018, showed EF improved to 45%, from prior 30-35% in 05/2018, when he had his stroke.   He is here for follow up today. He is much more complaint and aware of his medications now. He is not taking chlorthalidone as he is unable to cut in half. Also, he is not taking Bidil. Otherwise, he is taking all other medications. He denies chest pain, shortness of breath, palpitations, leg edema, orthopnea, PND, TIA/syncope.   Past Medical History:  Diagnosis Date  . Anemia   . BPH (benign prostatic hyperplasia)   . Cataracts, bilateral   . CVA (cerebral vascular accident) (HCC)   . DVT (deep venous thrombosis) (HCC)    dvt in left leg  . Dyslipidemia   . Gout   . Gout   . HTN (hypertension)   . Left leg pain   . Osteoarthritis   . PAD (peripheral artery disease) (HCC)   . Stasis dermatitis   . Stroke (HCC)   . Vitamin D deficiency     Past Surgical History:  Procedure Laterality Date  . ABDOMINAL AORTOGRAM N/A 01/13/2018   Procedure: ABDOMINAL AORTOGRAM;  Surgeon: Elder Negus, MD;  [facility];   Service: Cardiovascular;  Laterality: N/A;  . CATARACT EXTRACTION, BILATERAL    . LEFT HEART CATH AND CORONARY ANGIOGRAPHY N/A 01/13/2018   Procedure: LEFT HEART CATH AND CORONARY ANGIOGRAPHY;  Surgeon: Elder Negus, MD;  [facility];  Service: Cardiovascular;  Laterality: N/A;  . LOWER EXTREMITY ANGIOGRAPHY N/A 01/13/2018   Procedure: LOWER EXTREMITY ANGIOGRAPHY;  Surgeon: Elder Negus, MD;  [facility];  Service: Cardiovascular;  Laterality: N/A;  . LOWER EXTREMITY ANGIOGRAPHY Right 01/27/2018   Procedure: LOWER EXTREMITY ANGIOGRAPHY;  Surgeon: Yates Decamp, MD;  [facility];  Service: Cardiovascular;  Laterality: Right;  . PERIPHERAL VASCULAR ATHERECTOMY Left 01/13/2018   Procedure: PERIPHERAL VASCULAR ATHERECTOMY;  Surgeon: Elder Negus, MD;  [facility];  Service: Cardiovascular;  Laterality: Left;  SFA WITH PTA DRUG COATED BALLOON  . PERIPHERAL VASCULAR INTERVENTION  01/27/2018   Procedure: PERIPHERAL VASCULAR INTERVENTION;  Surgeon: Yates Decamp, MD;  [facility];  Service: Cardiovascular;;    Social History   Socioeconomic History  . Marital status: Single    Spouse name: Not on file  . Number of children: 3  . Years of education: Not on file  . Highest education level: Not on file  Occupational History  . Not on file  Social Needs  . Financial resource strain: Not on file  . Food insecurity:  Worry: Not on file    Inability: Not on file  . Transportation needs:    Medical: Not on file    Non-medical: Not on file  Tobacco Use  . Smoking status: Never Smoker  . Smokeless tobacco: Never Used  Substance and Sexual Activity  . Alcohol use: Yes    Alcohol/week: 1.0 standard drinks    Types: 1 Shots of liquor per week    Frequency: Never    Comment: occasionally  . Drug use: Yes    Types: Marijuana    Comment: has not smoke since stroke   . Sexual activity: Not on file    Lifestyle  . Physical activity:    Days per week: Not on file    Minutes per session: Not on file  . Stress: Not on file  Relationships  . Social connections:    Talks on phone: Not on file    Gets together: Not on file    Attends religious service: Not on file    Active member of club or organization: Not on file    Attends meetings of clubs or organizations: Not on file    Relationship status: Not on file  . Intimate partner violence:    Fear of current or ex partner: Not on file    Emotionally abused: Not on file    Physically abused: Not on file    Forced sexual activity: Not on file  Other Topics Concern  . Not on file  Social History Narrative   Lives alone. 2 daughters live here in [place]. Son lives in [place].      Current Outpatient Medications on File Prior to Visit  Medication Sig Dispense Refill  . amLODipine (NORVASC) 5 MG tablet Take 5 mg by mouth daily.    Marland Kitchen atorvastatin (LIPITOR) 80 MG tablet Take 1 tablet (80 mg total) by mouth daily. 90 tablet 0  . bisoprolol (ZEBETA) 5 MG tablet Take 2.5 mg by mouth daily.     . Cholecalciferol (VITAMIN D3) 5000 units CAPS Take 5,000 Units by mouth daily.    . colchicine 0.6 MG tablet Take 1 tab daily until 48 hours after resolution of symptoms, or up to 7 days. 7 tablet 0  . Elastic Bandages & Supports (MEDICAL COMPRESSION STOCKINGS) MISC     . furosemide (LASIX) 20 MG tablet Take 20 mg by mouth daily.  0  . gabapentin (NEURONTIN) 300 MG capsule Take 300 mg by mouth at bedtime.    Marland Kitchen ketorolac (ACULAR) 0.5 % ophthalmic solution ketorolac 0.5 % eye drops    . Menthol-Methyl Salicylate (MUSCLE RUB) 10-15 % CREA muscle rub    . methimazole (TAPAZOLE) 10 MG tablet Take 1 tablet (10 mg total) by mouth daily. 30 tablet 6  . Misc. Devices (RAISED TOILET SEAT/LOCK & ARMS) MISC raised toilet seat    . Misc. Devices (RUBBER BATH MAT) MISC bath mat    . Multiple Vitamins-Minerals (COMPLETE SENIOR PO) 1 tablet daily.     .  Omega-3 Fatty Acids (FISH OIL PO) Take 1,000 mg by mouth.     . tamsulosin (FLOMAX) 0.4 MG CAPS capsule TAKE ONE CAPSULE BY MOUTH DAILY . TAKE HALF HOUR FOLLOWING THE SAME MEAL DAILY 30 capsule 2  . tobramycin (TOBREX) 0.3 % ophthalmic solution tobramycin 0.3 % eye drops    . vitamin C (ASCORBIC ACID) 500 MG tablet Take 500 mg daily by mouth.    . chlorthalidone (HYGROTON) 25 MG tablet Take 0.5 mg by mouth  daily. (Not taking)    . isosorbide-hydrALAZINE (BIDIL) 20-37.5 MG tablet Take by mouth. (Not taking)     No current facility-administered medications on file prior to visit.      Cardiovascular studies:  Echocardiogram 07/24/2018: 1. Left ventricle cavity is normal in size. Mild concentric hypertrophy of the left ventricle. Doppler evidence of grade I (impaired) diastolic dysfunction, normal LAP. Left ventricle regional wall motion findings: Basal and mid inferior hypokinesis. Calculated EF 46%. 2. Moderate aortic valve leaflet thickening. Mildly restricted aortic valve leaflets. No evidence of aortic valve stenosis. Mild (Grade I) aortic regurgitation. 3. Structurally normal mitral valve with mild (Grade I) regurgitation. 4. Compared to the study done on 09/20/2017, no significant change in LVEF. Wall motion appears to be new. Previously mitral regurgitation was mild to moderate.  Periperal angiography 01/27/2018:  Right mid and distal SFA CTO, moderate calcification evident. Successful PTA and stenting of the CTO right mid and distal SFA with antegrade left femoral and retrograde right PT access and implantation of 2 overlapping 6.0 x 1 20 mm Zilver PTX drug-coated stents. Stenosis reduced from 100% to 0%. Brisk flow was evident at the end of the procedure.  Peripheral angiogram/intervention 01/13/2018: Left distal SFA subtotal occlusion treated successfully with directional atherectomy and 6.0 X 120 mm In.Pact Admiral drug coated balloon PTA. 0% residual stenosis in distal SFA with  excellent flow. 2 vessel runoff below the knee with proximally occluded left antetior tibial artery. Right distal SFA CTO with reconstitution in right poppliteal artery. 2 vessel runoff with  proximally occluded right antetior tibial artery.   Cath 01/13/2018: Mild nonobstructive coronary artery disease (Prox LCx 50%)   Review of Systems  Constitution: Negative for decreased appetite, malaise/fatigue, weight gain and weight loss.  HENT: Negative for congestion.   Eyes: Negative for visual disturbance.  Cardiovascular: Negative for chest pain, dyspnea on exertion, leg swelling, palpitations and syncope.  Respiratory: Negative for shortness of breath.   Endocrine: Negative for cold intolerance.  Hematologic/Lymphatic: Does not bruise/bleed easily.  Skin: Negative for itching and rash.  Musculoskeletal: Positive for joint pain (Bilateral knees). Negative for myalgias.  Gastrointestinal: Negative for abdominal pain, nausea and vomiting.  Genitourinary: Negative for dysuria.  Neurological: Negative for dizziness and weakness.  Psychiatric/Behavioral: The patient is not nervous/anxious.   All other systems reviewed and are negative.      Objective:   Vitals:   01/04/19 1205  BP: 138/65  Pulse: (!) 53  SpO2: 95%     Physical Exam  Constitutional: He is oriented to person, place, and time. He appears well-developed and well-nourished. No distress.  HENT:  Head: Normocephalic and atraumatic.  Eyes: Pupils are equal, round, and reactive to light. Conjunctivae are normal.  Neck: No JVD present.  Cardiovascular: Normal rate and regular rhythm.  Pulses:      Dorsalis pedis pulses are 1+ on the right side and 1+ on the left side.       Posterior tibial pulses are 0 on the right side and 0 on the left side.  Pulmonary/Chest: Effort normal and breath sounds normal. He has no wheezes. He has no rales.  Abdominal: Soft. Bowel sounds are normal. There is no rebound.  Musculoskeletal:         General: No edema.  Lymphadenopathy:    He has no cervical adenopathy.  Neurological: He is alert and oriented to person, place, and time. No cranial nerve deficit.  Skin: Skin is warm and dry.  Psychiatric: He has a  normal mood and affect.  Nursing note and vitals reviewed.       Assessment & Recommendations:   85 year old African-American male with hypertension, PAD s/p successful DCB Rt SFA and 2 overlapping Zilver PTX stents placed left SFA (12/2017), now s/p left temporal occipital cortical infarct.  Hypertension: Controlled even without patient taking Bidil and chlorthalidone. I have stopped these two medications.   H/o CVA: Continue aspirin 81 mg daily, Lipitor 80 mg daily. Continue risk factor modification.  Bradycardia: Improved. Asymptomatic.   Reduced LVEF: Improved to 45% on follow up echocardiogram in 06/2018. Clinically asymptomatic.  PAD: Stable. I think his leg pain is more likely due to osetoarthritis, than PAD. Conitue medical management for now.  I will see him back in 6 months.    Elder NegusManish J Delynn Olvera, MD [facility] Pager: [phone] Office: [phone] If no answer Cell [phone]

## 2019-01-04 ENCOUNTER — Ambulatory Visit (INDEPENDENT_AMBULATORY_CARE_PROVIDER_SITE_OTHER): Payer: Medicare HMO | Admitting: Cardiology

## 2019-01-04 ENCOUNTER — Encounter: Payer: Self-pay | Admitting: Cardiology

## 2019-01-04 VITALS — BP 138/65 | HR 53 | Ht 72.0 in | Wt 242.9 lb

## 2019-01-04 DIAGNOSIS — I739 Peripheral vascular disease, unspecified: Secondary | ICD-10-CM

## 2019-01-04 DIAGNOSIS — I10 Essential (primary) hypertension: Secondary | ICD-10-CM | POA: Diagnosis not present

## 2019-01-04 DIAGNOSIS — I639 Cerebral infarction, unspecified: Secondary | ICD-10-CM | POA: Insufficient documentation

## 2019-01-04 DIAGNOSIS — R001 Bradycardia, unspecified: Secondary | ICD-10-CM | POA: Diagnosis not present

## 2019-01-04 DIAGNOSIS — R6889 Other general symptoms and signs: Secondary | ICD-10-CM | POA: Diagnosis not present

## 2019-01-05 DIAGNOSIS — I639 Cerebral infarction, unspecified: Secondary | ICD-10-CM | POA: Diagnosis not present

## 2019-01-06 DIAGNOSIS — R6889 Other general symptoms and signs: Secondary | ICD-10-CM | POA: Diagnosis not present

## 2019-01-06 DIAGNOSIS — I639 Cerebral infarction, unspecified: Secondary | ICD-10-CM | POA: Diagnosis not present

## 2019-01-07 DIAGNOSIS — M25662 Stiffness of left knee, not elsewhere classified: Secondary | ICD-10-CM | POA: Diagnosis not present

## 2019-01-07 DIAGNOSIS — R269 Unspecified abnormalities of gait and mobility: Secondary | ICD-10-CM | POA: Diagnosis not present

## 2019-01-07 DIAGNOSIS — R6889 Other general symptoms and signs: Secondary | ICD-10-CM | POA: Diagnosis not present

## 2019-01-07 DIAGNOSIS — I639 Cerebral infarction, unspecified: Secondary | ICD-10-CM | POA: Diagnosis not present

## 2019-01-07 DIAGNOSIS — M25462 Effusion, left knee: Secondary | ICD-10-CM | POA: Diagnosis not present

## 2019-01-07 DIAGNOSIS — M25562 Pain in left knee: Secondary | ICD-10-CM | POA: Diagnosis not present

## 2019-01-08 ENCOUNTER — Other Ambulatory Visit: Payer: Self-pay | Admitting: Nurse Practitioner

## 2019-01-08 DIAGNOSIS — M25562 Pain in left knee: Secondary | ICD-10-CM | POA: Diagnosis not present

## 2019-01-08 DIAGNOSIS — I639 Cerebral infarction, unspecified: Secondary | ICD-10-CM | POA: Diagnosis not present

## 2019-01-08 DIAGNOSIS — M25462 Effusion, left knee: Secondary | ICD-10-CM | POA: Diagnosis not present

## 2019-01-08 DIAGNOSIS — R269 Unspecified abnormalities of gait and mobility: Secondary | ICD-10-CM | POA: Diagnosis not present

## 2019-01-08 DIAGNOSIS — R6889 Other general symptoms and signs: Secondary | ICD-10-CM | POA: Diagnosis not present

## 2019-01-08 DIAGNOSIS — M25662 Stiffness of left knee, not elsewhere classified: Secondary | ICD-10-CM | POA: Diagnosis not present

## 2019-01-11 DIAGNOSIS — I639 Cerebral infarction, unspecified: Secondary | ICD-10-CM | POA: Diagnosis not present

## 2019-01-12 DIAGNOSIS — I639 Cerebral infarction, unspecified: Secondary | ICD-10-CM | POA: Diagnosis not present

## 2019-01-13 DIAGNOSIS — R6889 Other general symptoms and signs: Secondary | ICD-10-CM | POA: Diagnosis not present

## 2019-01-13 DIAGNOSIS — I639 Cerebral infarction, unspecified: Secondary | ICD-10-CM | POA: Diagnosis not present

## 2019-01-14 DIAGNOSIS — I639 Cerebral infarction, unspecified: Secondary | ICD-10-CM | POA: Diagnosis not present

## 2019-01-15 DIAGNOSIS — I639 Cerebral infarction, unspecified: Secondary | ICD-10-CM | POA: Diagnosis not present

## 2019-01-18 DIAGNOSIS — I639 Cerebral infarction, unspecified: Secondary | ICD-10-CM | POA: Diagnosis not present

## 2019-01-19 DIAGNOSIS — I639 Cerebral infarction, unspecified: Secondary | ICD-10-CM | POA: Diagnosis not present

## 2019-01-20 DIAGNOSIS — I639 Cerebral infarction, unspecified: Secondary | ICD-10-CM | POA: Diagnosis not present

## 2019-01-21 DIAGNOSIS — I639 Cerebral infarction, unspecified: Secondary | ICD-10-CM | POA: Diagnosis not present

## 2019-01-22 DIAGNOSIS — I639 Cerebral infarction, unspecified: Secondary | ICD-10-CM | POA: Diagnosis not present

## 2019-01-25 DIAGNOSIS — R6889 Other general symptoms and signs: Secondary | ICD-10-CM | POA: Diagnosis not present

## 2019-01-25 DIAGNOSIS — I639 Cerebral infarction, unspecified: Secondary | ICD-10-CM | POA: Diagnosis not present

## 2019-01-26 DIAGNOSIS — I639 Cerebral infarction, unspecified: Secondary | ICD-10-CM | POA: Diagnosis not present

## 2019-01-27 DIAGNOSIS — I639 Cerebral infarction, unspecified: Secondary | ICD-10-CM | POA: Diagnosis not present

## 2019-01-28 ENCOUNTER — Ambulatory Visit (INDEPENDENT_AMBULATORY_CARE_PROVIDER_SITE_OTHER): Payer: Medicare HMO

## 2019-01-28 ENCOUNTER — Other Ambulatory Visit: Payer: Self-pay

## 2019-01-28 ENCOUNTER — Ambulatory Visit (INDEPENDENT_AMBULATORY_CARE_PROVIDER_SITE_OTHER): Payer: Medicare HMO | Admitting: Internal Medicine

## 2019-01-28 ENCOUNTER — Encounter: Payer: Self-pay | Admitting: Internal Medicine

## 2019-01-28 VITALS — BP 130/68 | HR 50 | Temp 98.0°F | Ht 72.0 in | Wt 241.0 lb

## 2019-01-28 DIAGNOSIS — R6889 Other general symptoms and signs: Secondary | ICD-10-CM | POA: Diagnosis not present

## 2019-01-28 DIAGNOSIS — I10 Essential (primary) hypertension: Secondary | ICD-10-CM | POA: Diagnosis not present

## 2019-01-28 DIAGNOSIS — I639 Cerebral infarction, unspecified: Secondary | ICD-10-CM | POA: Diagnosis not present

## 2019-01-28 DIAGNOSIS — E0590 Thyrotoxicosis, unspecified without thyrotoxic crisis or storm: Secondary | ICD-10-CM

## 2019-01-28 DIAGNOSIS — Z0000 Encounter for general adult medical examination without abnormal findings: Secondary | ICD-10-CM

## 2019-01-28 DIAGNOSIS — M79641 Pain in right hand: Secondary | ICD-10-CM

## 2019-01-28 DIAGNOSIS — M79642 Pain in left hand: Secondary | ICD-10-CM | POA: Diagnosis not present

## 2019-01-28 DIAGNOSIS — Z23 Encounter for immunization: Secondary | ICD-10-CM

## 2019-01-28 MED ORDER — GABAPENTIN 300 MG PO CAPS: 300.0000 mg | ORAL_CAPSULE | Freq: Every day | ORAL | 0 refills | Status: DC

## 2019-01-28 MED ORDER — PNEUMOCOCCAL 13-VAL CONJ VACC IM SUSP: 0.5000 mL | INTRAMUSCULAR | 0 refills | Status: AC

## 2019-01-28 NOTE — Patient Instructions (Signed)
[name] , Thank you for taking time to come for your Medicare Wellness Visit. I appreciate your ongoing commitment to your health goals. Please review the following plan we discussed and let me know if I can assist you in the future.   Screening recommendations/referrals: Colonoscopy: not required Recommended yearly ophthalmology/optometry visit for glaucoma screening and checkup Recommended yearly dental visit for hygiene and checkup  Vaccinations: Influenza vaccine: 10/2018 Pneumococcal vaccine: prevnar sent to pharmacy Tdap vaccine: decline Shingles vaccine: discussed    Advanced directives: Advance directive discussed with you today. Even though you declined this today please call our office should you change your mind and we can give you the proper paperwork for you to fill out.   Conditions/risks identified: Obesity  Next appointment: 02/03/2020 at 2p  Preventive Care 65 Years and Older, Male Preventive care refers to lifestyle choices and visits with your health care provider that can promote health and wellness. What does preventive care include?  A yearly physical exam. This is also called an annual well check.  Dental exams once or twice a year.  Routine eye exams. Ask your health care provider how often you should have your eyes checked.  Personal lifestyle choices, including:  Daily care of your teeth and gums.  Regular physical activity.  Eating a healthy diet.  Avoiding tobacco and drug use.  Limiting alcohol use.  Practicing safe sex.  Taking low doses of aspirin every day.  Taking vitamin and mineral supplements as recommended by your health care provider. What happens during an annual well check? The services and screenings done by your health care provider during your annual well check will depend on your age, overall health, lifestyle risk factors, and family history of disease. Counseling  Your health care provider may ask you questions about  your:  Alcohol use.  Tobacco use.  Drug use.  Emotional well-being.  Home and relationship well-being.  Sexual activity.  Eating habits.  History of falls.  Memory and ability to understand (cognition).  Work and work Astronomer. Screening  You may have the following tests or measurements:  Height, weight, and BMI.  Blood pressure.  Lipid and cholesterol levels. These may be checked every 5 years, or more frequently if you are over 70 years old.  Skin check.  Lung cancer screening. You may have this screening every year starting at age 50 if you have a 30-pack-year history of smoking and currently smoke or have quit within the past 15 years.  Fecal occult blood test (FOBT) of the stool. You may have this test every year starting at age 41.  Flexible sigmoidoscopy or colonoscopy. You may have a sigmoidoscopy every 5 years or a colonoscopy every 10 years starting at age 44.  Prostate cancer screening. Recommendations will vary depending on your family history and other risks.  Hepatitis C blood test.  Hepatitis B blood test.  Sexually transmitted disease (STD) testing.  Diabetes screening. This is done by checking your blood sugar (glucose) after you have not eaten for a while (fasting). You may have this done every 1-3 years.  Abdominal aortic aneurysm (AAA) screening. You may need this if you are a current or former smoker.  Osteoporosis. You may be screened starting at age 59 if you are at high risk. Talk with your health care provider about your test results, treatment options, and if necessary, the need for more tests. Vaccines  Your health care provider may recommend certain vaccines, such as:  Influenza vaccine. This  is recommended every year.  Tetanus, diphtheria, and acellular pertussis (Tdap, Td) vaccine. You may need a Td booster every 10 years.  Zoster vaccine. You may need this after age 96.  Pneumococcal 13-valent conjugate (PCV13) vaccine.  One dose is recommended after age 47.  Pneumococcal polysaccharide (PPSV23) vaccine. One dose is recommended after age 4. Talk to your health care provider about which screenings and vaccines you need and how often you need them. This information is not intended to replace advice given to you by your health care provider. Make sure you discuss any questions you have with your health care provider. Document Released: 12/08/2015 Document Revised: 07/31/2016 Document Reviewed: 09/12/2015 Elsevier Interactive Patient Education  2017 [place] Prevention in the Home Falls can cause injuries. They can happen to people of all ages. There are many things you can do to make your home safe and to help prevent falls. What can I do on the outside of my home?  Regularly fix the edges of walkways and driveways and fix any cracks.  Remove anything that might make you trip as you walk through a door, such as a raised step or threshold.  Trim any bushes or trees on the path to your home.  Use bright outdoor lighting.  Clear any walking paths of anything that might make someone trip, such as rocks or tools.  Regularly check to see if handrails are loose or broken. Make sure that both sides of any steps have handrails.  Any raised decks and porches should have guardrails on the edges.  Have any leaves, snow, or ice cleared regularly.  Use sand or salt on walking paths during winter.  Clean up any spills in your garage right away. This includes oil or grease spills. What can I do in the bathroom?  Use night lights.  Install grab bars by the toilet and in the tub and shower. Do not use towel bars as grab bars.  Use non-skid mats or decals in the tub or shower.  If you need to sit down in the shower, use a plastic, non-slip stool.  Keep the floor dry. Clean up any water that spills on the floor as soon as it happens.  Remove soap buildup in the tub or shower regularly.  Attach bath  mats securely with double-sided non-slip rug tape.  Do not have throw rugs and other things on the floor that can make you trip. What can I do in the bedroom?  Use night lights.  Make sure that you have a light by your bed that is easy to reach.  Do not use any sheets or blankets that are too big for your bed. They should not hang down onto the floor.  Have a firm chair that has side arms. You can use this for support while you get dressed.  Do not have throw rugs and other things on the floor that can make you trip. What can I do in the kitchen?  Clean up any spills right away.  Avoid walking on wet floors.  Keep items that you use a lot in easy-to-reach places.  If you need to reach something above you, use a strong step stool that has a grab bar.  Keep electrical cords out of the way.  Do not use floor polish or wax that makes floors slippery. If you must use wax, use non-skid floor wax.  Do not have throw rugs and other things on the floor that can make you  trip. What can I do with my stairs?  Do not leave any items on the stairs.  Make sure that there are handrails on both sides of the stairs and use them. Fix handrails that are broken or loose. Make sure that handrails are as long as the stairways.  Check any carpeting to make sure that it is firmly attached to the stairs. Fix any carpet that is loose or worn.  Avoid having throw rugs at the top or bottom of the stairs. If you do have throw rugs, attach them to the floor with carpet tape.  Make sure that you have a light switch at the top of the stairs and the bottom of the stairs. If you do not have them, ask someone to add them for you. What else can I do to help prevent falls?  Wear shoes that:  Do not have high heels.  Have rubber bottoms.  Are comfortable and fit you well.  Are closed at the toe. Do not wear sandals.  If you use a stepladder:  Make sure that it is fully opened. Do not climb a closed  stepladder.  Make sure that both sides of the stepladder are locked into place.  Ask someone to hold it for you, if possible.  Clearly mark and make sure that you can see:  Any grab bars or handrails.  First and last steps.  Where the edge of each step is.  Use tools that help you move around (mobility aids) if they are needed. These include:  Canes.  Walkers.  Scooters.  Crutches.  Turn on the lights when you go into a dark area. Replace any light bulbs as soon as they burn out.  Set up your furniture so you have a clear path. Avoid moving your furniture around.  If any of your floors are uneven, fix them.  If there are any pets around you, be aware of where they are.  Review your medicines with your doctor. Some medicines can make you feel dizzy. This can increase your chance of falling. Ask your doctor what other things that you can do to help prevent falls. This information is not intended to replace advice given to you by your health care provider. Make sure you discuss any questions you have with your health care provider. Document Released: 09/07/2009 Document Revised: 04/18/2016 Document Reviewed: 12/16/2014 Elsevier Interactive Patient Education  2017 [employer].

## 2019-01-28 NOTE — Progress Notes (Signed)
Subjective:   Jesus Lam is a 85 y.o. male who presents for an Initial Medicare Annual Wellness Visit.  Review of Systems  n/a Cardiac Risk Factors include: advanced age (>58men, >30 women);hypertension;obesity (BMI >30kg/m2)    Objective:    Today's Vitals   01/28/19 1506 01/28/19 1508  BP: 130/68   Pulse: (!) 50   Temp: 98 F (36.7 C)   TempSrc: Oral   SpO2: 96%   Weight: 241 lb (109.3 kg)   Height: 6' (1.829 m)   PainSc:  8    Body mass index is 32.69 kg/m.  Advanced Directives 01/28/2019 06/13/2018 06/12/2018 01/27/2018 01/17/2018 01/13/2018 10/10/2017  Does Patient Have a Medical Advance Directive? No No No Yes No No No  Type of Advance Directive - - - Living will - - -  Does patient want to make changes to medical advance directive? - - - No - Patient declined - - -  Would patient like information on creating a medical advance directive? No - Patient declined No - Patient declined No - Patient declined - - - No - Patient declined    Current Medications (verified) Outpatient Encounter Medications as of 01/28/2019  Medication Sig  . amLODipine (NORVASC) 5 MG tablet Take 5 mg by mouth daily.  Marland Kitchen atorvastatin (LIPITOR) 80 MG tablet Take 1 tablet (80 mg total) by mouth daily.  . bisoprolol (ZEBETA) 5 MG tablet Take 2.5 mg by mouth daily.   . chlorthalidone (HYGROTON) 25 MG tablet Take 0.5 mg by mouth daily.   . Cholecalciferol (VITAMIN D3) 5000 units CAPS Take 5,000 Units by mouth daily.  . colchicine 0.6 MG tablet Take 1 tab daily until 48 hours after resolution of symptoms, or up to 7 days.  [name] (MEDICAL COMPRESSION STOCKINGS) MISC   . furosemide (LASIX) 20 MG tablet Take 20 mg by mouth daily.  Marland Kitchen gabapentin (NEURONTIN) 300 MG capsule Take 300 mg by mouth at bedtime.  . isosorbide-hydrALAZINE (BIDIL) 20-37.5 MG tablet Take by mouth.   Marland Kitchen ketorolac (ACULAR) 0.5 % ophthalmic solution ketorolac 0.5 % eye drops  . Menthol-Methyl Salicylate (MUSCLE  RUB) 10-15 % CREA muscle rub  . methimazole (TAPAZOLE) 10 MG tablet Take 1 tablet (10 mg total) by mouth daily.  . Misc. Devices (RAISED TOILET SEAT/LOCK & ARMS) MISC raised toilet seat  . Misc. Devices (RUBBER BATH MAT) MISC bath mat  . Multiple Vitamins-Minerals (COMPLETE SENIOR PO) 1 tablet daily.   . Omega-3 Fatty Acids (FISH OIL PO) Take 1,000 mg by mouth.   . pneumococcal 13-valent conjugate vaccine (PREVNAR 13) SUSP injection Inject 0.5 mLs into the muscle tomorrow at 10 am for 1 dose.  . tamsulosin (FLOMAX) 0.4 MG CAPS capsule TAKE ONE CAPSULE BY MOUTH DAILY . TAKE HALF HOUR FOLLOWING THE SAME MEAL DAILY  . tobramycin (TOBREX) 0.3 % ophthalmic solution tobramycin 0.3 % eye drops  . vitamin C (ASCORBIC ACID) 500 MG tablet Take 500 mg daily by mouth.   No facility-administered encounter medications on file as of 01/28/2019.     Allergies (verified) Shellfish allergy   History: Past Medical History:  Diagnosis Date  . Anemia   . BPH (benign prostatic hyperplasia)   . Cataracts, bilateral   . CVA (cerebral vascular accident) (HCC)   . DVT (deep venous thrombosis) (HCC)    dvt in left leg  . Dyslipidemia   . Gout   . Gout   . HTN (hypertension)   . Left leg pain   .  Osteoarthritis   . PAD (peripheral artery disease) (HCC)   . Stasis dermatitis   . Stroke (HCC)   . Vitamin D deficiency    Past Surgical History:  Procedure Laterality Date  . ABDOMINAL AORTOGRAM N/A 01/13/2018   Procedure: ABDOMINAL AORTOGRAM;  Surgeon: Elder Negus, MD;  Location: [facility];  Service: Cardiovascular;  Laterality: N/A;  . CATARACT EXTRACTION, BILATERAL    . LEFT HEART CATH AND CORONARY ANGIOGRAPHY N/A 01/13/2018   Procedure: LEFT HEART CATH AND CORONARY ANGIOGRAPHY;  Surgeon: Elder Negus, MD;  Location: [facility];  Service: Cardiovascular;  Laterality: N/A;  . LOWER EXTREMITY ANGIOGRAPHY N/A 01/13/2018   Procedure: LOWER EXTREMITY ANGIOGRAPHY;  Surgeon:  Elder Negus, MD;  Location: [facility];  Service: Cardiovascular;  Laterality: N/A;  . LOWER EXTREMITY ANGIOGRAPHY Right 01/27/2018   Procedure: LOWER EXTREMITY ANGIOGRAPHY;  Surgeon: Yates Decamp, MD;  Location: [facility];  Service: Cardiovascular;  Laterality: Right;  . PERIPHERAL VASCULAR ATHERECTOMY Left 01/13/2018   Procedure: PERIPHERAL VASCULAR ATHERECTOMY;  Surgeon: Elder Negus, MD;  Location: [facility];  Service: Cardiovascular;  Laterality: Left;  SFA WITH PTA DRUG COATED BALLOON  . PERIPHERAL VASCULAR INTERVENTION  01/27/2018   Procedure: PERIPHERAL VASCULAR INTERVENTION;  Surgeon: Yates Decamp, MD;  Location: [facility];  Service: Cardiovascular;;   Family History  Problem Relation Age of Onset  . Cancer Mother   . Cancer Father   . Alcohol abuse Father   . Breast cancer Daughter   . Stroke Daughter   . CVA Daughter    Social History   Socioeconomic History  . Marital status: Single    Spouse name: Not on file  . Number of children: 3  . Years of education: Not on file  . Highest education level: Not on file  Occupational History  . Not on file  Social Needs  . Financial resource strain: Not on file  . Food insecurity:    Worry: Not on file    Inability: Not on file  . Transportation needs:    Medical: Not on file    Non-medical: Not on file  Tobacco Use  . Smoking status: Never Smoker  . Smokeless tobacco: Never Used  Substance and Sexual Activity  . Alcohol use: Yes    Alcohol/week: 1.0 standard drinks    Types: 1 Shots of liquor per week    Frequency: Never    Comment: occasionally  . Drug use: Yes    Types: Marijuana    Comment: has not smoke since stroke   . Sexual activity: Not on file  Lifestyle  . Physical activity:    Days per week: Not on file    Minutes per session: Not on file  . Stress: Not on file  Relationships  . Social connections:    Talks on phone: Not on file    Gets together: Not on  file    Attends religious service: Not on file    Active member of club or organization: Not on file    Attends meetings of clubs or organizations: Not on file    Relationship status: Not on file  Other Topics Concern  . Not on file  Social History Narrative   Lives alone. 2 daughters live here in [place]. Son lives in [place].   Tobacco Counseling Counseling given: Not Answered   Clinical Intake:  Pre-visit preparation completed: Yes  Pain : 0-10 Pain Score: 8  Pain Type: Chronic pain Pain Location: Knee Pain Orientation: Left Pain Radiating Towards: to the shin Pain Descriptors / Indicators: Aching Pain Onset: More than a month ago Pain Frequency: Intermittent Pain Relieving Factors: ice, Apap helps Effect of Pain on Daily Activities: none  Pain Relieving Factors: ice, Apap helps  Nutritional Status: BMI > 30  Obese Nutritional Risks: None Diabetes: No  How often do you need to have someone help you when you read instructions, pamphlets, or other written materials from your doctor or pharmacy?: 1 - Never What is the last grade level you completed in school?: some college  Interpreter Needed?: No  Information entered by :: NAllen LPN  Activities of Daily Living In your present state of health, do you have any difficulty performing the following activities: 01/28/2019 06/13/2018  Hearing? Y N  Comment sometimes -  Vision? Y N  Comment working with eye doctor -  Difficulty concentrating or making decisions? Malvin JohnsY Y  Comment working on it with puzzles -  Walking or climbing stairs? Y Y  Comment sometimes -  Dressing or bathing? N Y  Doing errands, shopping? Y N  Comment has someone bring you  -  Preparing Food and eating ? N -  Using the Toilet? N -  In the past six months, have you accidently leaked urine? Y -  Comment regularly wears a pad -  Do you have problems with loss of bowel control? N -  Managing your Medications? Y -  Comment would like  assistances -  Managing your Finances? N -  Housekeeping or managing your Housekeeping? Y -  Comment gets help when knee bothering -  Some recent data might be hidden     Immunizations and Health Maintenance Immunization History  Administered Date(s) Administered  . Influenza, High Dose Seasonal PF 10/29/2018   Health Maintenance Due  Topic Date Due  . PNA vac Low Risk Adult (1 of 2 - PCV13) 11/29/1999    Patient Care Team: Arnette FeltsMoore, Janece, FNP as PCP - General (General Practice)  Indicate any recent Medical Services you may have received from other than [facility] providers in the past year (date may be approximate).    Assessment:   This is a routine wellness examination for Jesus Lam.  Hearing/Vision screen Vision Screening Comments: Annual eye exams  Dietary issues and exercise activities discussed: Current Exercise Habits: Structured exercise class, Time (Minutes): > 60(2 1/2 hours), Frequency (Times/Week): 3, Weekly Exercise (Minutes/Week): 0, Intensity: Moderate  Goals    . Patient Stated (pt-stated)     Wants to get better with memory      Depression Screen PHQ 2/9 Scores 01/28/2019 01/28/2019 10/29/2018  PHQ - 2 Score 0 0 0  PHQ- 9 Score 6 - -    Fall Risk Fall Risk  01/28/2019 01/28/2019 10/29/2018  Falls in the past year? 0 0 0  Risk for fall due to : Medication side effect;Impaired vision - -  Follow up Falls prevention discussed;Education provided - -    Is the patient's home free of loose throw rugs in walkways, pet beds, electrical cords, etc?   yes      Grab bars in the bathroom? no      Handrails on the stairs?   yes      Adequate lighting?   yes  Timed Get Up and Go performed: n/a  Cognitive Function:     6CIT Screen 01/28/2019  What Year? 0 points  What month? 0 points  What time? 0  points  Count back from 20 4 points  Months in reverse 0 points  Repeat phrase 0 points  Total Score 4    Screening Tests Health Maintenance  Topic Date Due  . PNA vac  Low Risk Adult (1 of 2 - PCV13) 11/29/1999  . TETANUS/TDAP  01/28/2020 (Originally 11/28/1951)  . INFLUENZA VACCINE  Completed    Qualifies for Shingles Vaccine?  yes  Cancer Screenings: Lung: Low Dose CT Chest recommended if Age 68-80 years, 30 pack-year currently smoking OR have quit w/in 15years. Patient does not qualify. Colorectal: not required  Additional Screenings:  Hepatitis C Screening: n/a      Plan:   Wants to improve his memory. Works on puzzles and draws.  I have personally reviewed and noted the following in the patient's chart:   . Medical and social history . Use of alcohol, tobacco or illicit drugs  . Current medications and supplements . Functional ability and status . Nutritional status . Physical activity . Advanced directives . List of other physicians . Hospitalizations, surgeries, and ER visits in previous 12 months . Vitals . Screenings to include cognitive, depression, and falls . Referrals and appointments  In addition, I have reviewed and discussed with patient certain preventive protocols, quality metrics, and best practice recommendations. A written personalized care plan for preventive services as well as general preventive health recommendations were provided to patient.     Barb Merinoickeah E Trenell Moxey, LPN   8/4/69623/03/2019

## 2019-01-28 NOTE — Progress Notes (Addendum)
.  Subjective:     Patient ID: Jesus Lam , male    DOB: 07-14-1933 , 85 y.o.   MRN: [id number]   Chief Complaint  Patient presents with  . Hypertension  . Mass    on neck-no pain-    HPI Pt is here for Medicare wellness visit with LPN and Fu HTN and hyperthyroid.  BP's at home are around137-140/ upper 90's.  2- R neck lump- wants this checked. Is not painful. He noticed this a few days ago. Denies an injury.   3- onset of hand craping and swelling off and x 3 weeks. The area that mainly huts him is his index joints and index knuckle. Has not tried anything for this, but has noticed this get worse with change in climate if it is going to rain and after eating sweets.   Past Medical History:  Diagnosis Date  . Anemia   . BPH (benign prostatic hyperplasia)   . Cataracts, bilateral   . CVA (cerebral vascular accident) (HCC)   . DVT (deep venous thrombosis) (HCC)    dvt in left leg  . Dyslipidemia   . Gout   . Gout   . HTN (hypertension)   . Left leg pain   . Osteoarthritis   . PAD (peripheral artery disease) (HCC)   . Stasis dermatitis   . Stroke (HCC)   . Vitamin D deficiency      Family History  Problem Relation Age of Onset  . Cancer Mother   . Cancer Father   . Alcohol abuse Father   . Breast cancer Daughter   . Stroke Daughter   . CVA Daughter      Current Outpatient Medications:  .  amLODipine (NORVASC) 5 MG tablet, Take 5 mg by mouth daily., Disp: , Rfl:  .  atorvastatin (LIPITOR) 80 MG tablet, Take 1 tablet (80 mg total) by mouth daily., Disp: 90 tablet, Rfl: 0 .  Cholecalciferol (VITAMIN D3) 5000 units CAPS, Take 5,000 Units by mouth daily., Disp: , Rfl:  .  colchicine 0.6 MG tablet, Take 1 tab daily until 48 hours after resolution of symptoms, or up to 7 days., Disp: 7 tablet, Rfl: 0 .  Elastic Bandages & Supports (MEDICAL COMPRESSION STOCKINGS) MISC, , Disp: , Rfl:  .  furosemide (LASIX) 20 MG tablet, Take 20 mg by mouth daily., Disp: , Rfl: 0 .   gabapentin (NEURONTIN) 300 MG capsule, Take 300 mg by mouth at bedtime., Disp: , Rfl:  .  ketorolac (ACULAR) 0.5 % ophthalmic solution, ketorolac 0.5 % eye drops, Disp: , Rfl:  .  Menthol-Methyl Salicylate (MUSCLE RUB) 10-15 % CREA, muscle rub, Disp: , Rfl:  .  methimazole (TAPAZOLE) 10 MG tablet, Take 1 tablet (10 mg total) by mouth daily., Disp: 30 tablet, Rfl: 6 .  Misc. Devices (RAISED TOILET SEAT/LOCK & ARMS) MISC, raised toilet seat, Disp: , Rfl:  .  Misc. Devices (RUBBER BATH MAT) MISC, bath mat, Disp: , Rfl:  .  Multiple Vitamins-Minerals (COMPLETE SENIOR PO), 1 tablet daily. , Disp: , Rfl:  .  Omega-3 Fatty Acids (FISH OIL PO), Take 1,000 mg by mouth. , Disp: , Rfl:  .  tamsulosin (FLOMAX) 0.4 MG CAPS capsule, TAKE ONE CAPSULE BY MOUTH DAILY . TAKE HALF HOUR FOLLOWING THE SAME MEAL DAILY, Disp: 30 capsule, Rfl: 2 .  tobramycin (TOBREX) 0.3 % ophthalmic solution, tobramycin 0.3 % eye drops, Disp: , Rfl:  .  bisoprolol (ZEBETA) 5 MG tablet, Take 2.5 mg  by mouth daily. , Disp: , Rfl:  .  chlorthalidone (HYGROTON) 25 MG tablet, Take 0.5 mg by mouth daily. , Disp: , Rfl:  .  isosorbide-hydrALAZINE (BIDIL) 20-37.5 MG tablet, Take by mouth. , Disp: , Rfl:  .  pneumococcal 13-valent conjugate vaccine (PREVNAR 13) SUSP injection, Inject 0.5 mLs into the muscle tomorrow at 10 am for 1 dose., Disp: 0.5 mL, Rfl: 0 .  vitamin C (ASCORBIC ACID) 500 MG tablet, Take 500 mg daily by mouth., Disp: , Rfl:    Allergies  Allergen Reactions  . Shellfish Allergy Other (See Comments)    Gout      Review of Systems  Constitutional: Negative for appetite change, chills, diaphoresis, fatigue and fever.  HENT: Negative for congestion.   Eyes: Negative for discharge and visual disturbance.  Respiratory: Negative for cough, choking and shortness of breath.   Cardiovascular: Negative for chest pain, palpitations and leg swelling.  Endocrine: Negative for polydipsia and polyphagia.  Genitourinary: Negative for  difficulty urinating.  Musculoskeletal: Negative for myalgias.  Skin: Negative for rash and wound.  Neurological: Negative for dizziness and headaches.     Today's Vitals   01/28/19 1440  BP: 130/68  Pulse: (!) 50  Temp: 98 F (36.7 C)  TempSrc: Oral  SpO2: 96%  Weight: 241 lb (109.3 kg)  Height: 6' (1.829 m)   Body mass index is 32.69 kg/m.   Objective:  Physical Exam   Constitutional: he is oriented to person, place, and time. he appears well-developed and well-nourished. No distress.  HENT:  Head: Normocephalic and atraumatic.  Right Ear: External ear normal.  Left Ear: External ear normal.  Nose: Nose normal.  Eyes: Conjunctivae are normal. Right eye exhibits no discharge. Left eye exhibits no discharge. No scleral icterus.  Neck: Neck supple. No thyromegaly present. Area of concern is his R sternal notch which feels same as the L side. Is not painful or red.  No carotid bruits bilaterally  Cardiovascular: Normal rate and regular rhythm.  No murmur heard. Pulmonary/Chest: Effort normal and breath sounds normal. No respiratory distress.  Musculoskeletal: Normal range of motion. he exhibits no edema. Hands are normal and does not have any pain right now.  Lymphadenopathy: he has no cervical adenopathy.  Neurological: he is alert and oriented to person, place, and time.  Skin: Skin is warm and dry. Capillary refill takes less than 2 seconds. No rash noted. he is not diaphoretic.  Psychiatric: he has a normal mood and affect. Her behavior is normal. Judgment and thought content normal.  Nursing note reviewed. Assessment And Plan:    1. Essential hypertension- controlled. Fu in 3 months.   - CMP14 + Anion Gap - CBC no Diff  2. Subclinical hyperthyroidism- chronic.  - US THYROID; Future - TSH - T3 - T4, Free - T3, free - T4 - Thyroid Peroxidase Antibody - Thyroid antibodies - Ambulatory referral to Endocrinology  3. Bilateral hand pain- may be due to OA's.  Advised to avoid sweets and may try Aspercreme when he has flairs of pain.     [place] RODRIGUEZ-SOUTHWORTH, PA-C

## 2019-01-28 NOTE — Patient Instructions (Addendum)
TODAY YOU HAD YOUR MEDICARE VISIT AND MET ANGEL WHO IS GOING TO HELP YOU WITH YOUR MEDICATION MANAGEMENT. THANK YOU FOR BRINGING ALL YOUR PILLS.  YOU HAVE AN OVER ACTIVE THYROID AND I AM ORDERING A THYROID ULTRASOUND AND SENDING YOU TO AN ENDOCRINOLOGIST. I ALSO ORDERED MORE THYROID LABS AND OTHER ONES RELATED TO YOUR BLOOD PRESSURE.   I BELIEVE YOUR HANDS PAIN IS FROM ARTHRITIS AND YOU MAY APPLY ASPERCREAM ON IT WHEN IT FLAIRS. THE MAIN THING IS FOR YOU TO AVOID SWEETS.   PLEASE DO BLOOD PRESSURE  DIARY AT LEAST 3 TIMES A WEEK 2-3 HOURS AFTER YOU TAKE YOUR BLOOD PRESSURE MEDICATION. IF YOU HAVE 3 OR MORE READING OF MORE THAN 140/90, PLEASE COME SEE [place] SOONER.   BRING YOUR BLOOD PRESSURE CUFF WITH YOU NEXT VISIT.

## 2019-01-29 ENCOUNTER — Ambulatory Visit (INDEPENDENT_AMBULATORY_CARE_PROVIDER_SITE_OTHER): Payer: Medicare HMO

## 2019-01-29 DIAGNOSIS — E0590 Thyrotoxicosis, unspecified without thyrotoxic crisis or storm: Secondary | ICD-10-CM

## 2019-01-29 DIAGNOSIS — F09 Unspecified mental disorder due to known physiological condition: Secondary | ICD-10-CM | POA: Diagnosis not present

## 2019-01-29 DIAGNOSIS — E0789 Other specified disorders of thyroid: Secondary | ICD-10-CM | POA: Diagnosis not present

## 2019-01-29 DIAGNOSIS — M79641 Pain in right hand: Secondary | ICD-10-CM

## 2019-01-29 DIAGNOSIS — I10 Essential (primary) hypertension: Secondary | ICD-10-CM | POA: Diagnosis not present

## 2019-01-29 DIAGNOSIS — M79642 Pain in left hand: Secondary | ICD-10-CM

## 2019-01-29 DIAGNOSIS — I639 Cerebral infarction, unspecified: Secondary | ICD-10-CM | POA: Diagnosis not present

## 2019-01-29 LAB — CMP14 + ANION GAP
ALK PHOS: 80 IU/L (ref 39–117)
ALT: 16 IU/L (ref 0–44)
AST: 17 IU/L (ref 0–40)
Albumin/Globulin Ratio: 1.2 (ref 1.2–2.2)
Albumin: 4.1 g/dL (ref 3.6–4.6)
Anion Gap: 14 mmol/L (ref 10.0–18.0)
BUN/Creatinine Ratio: 13 (ref 10–24)
BUN: 19 mg/dL (ref 8–27)
Bilirubin Total: 0.6 mg/dL (ref 0.0–1.2)
CHLORIDE: 105 mmol/L (ref 96–106)
CO2: 25 mmol/L (ref 20–29)
Calcium: 10.2 mg/dL (ref 8.6–10.2)
Creatinine, Ser: 1.51 mg/dL — ABNORMAL HIGH (ref 0.76–1.27)
GFR calc Af Amer: 48 mL/min/{1.73_m2} — ABNORMAL LOW (ref >59)
GFR calc non Af Amer: 42 mL/min/{1.73_m2} — ABNORMAL LOW (ref >59)
Globulin, Total: 3.3 g/dL (ref 1.5–4.5)
Glucose: 100 mg/dL — ABNORMAL HIGH (ref 65–99)
Potassium: 5.1 mmol/L (ref 3.5–5.2)
Sodium: 144 mmol/L (ref 134–144)
Total Protein: 7.4 g/dL (ref 6.0–8.5)

## 2019-01-29 LAB — THYROID ANTIBODIES
Thyroglobulin Antibody: <1 IU/mL (ref 0.0–0.9)
Thyroperoxidase Ab SerPl-aCnc: 21 IU/mL (ref 0–34)

## 2019-01-29 LAB — T4: T4, Total: 7.8 ug/dL (ref 4.5–12.0)

## 2019-01-29 LAB — CBC
Hematocrit: 36.4 % — ABNORMAL LOW (ref 37.5–51.0)
Hemoglobin: 11.7 g/dL — ABNORMAL LOW (ref 13.0–17.7)
MCH: 29.5 pg (ref 26.6–33.0)
MCHC: 32.1 g/dL (ref 31.5–35.7)
MCV: 92 fL (ref 79–97)
Platelets: 194 10*3/uL (ref 150–450)
RBC: 3.96 x10E6/uL — ABNORMAL LOW (ref 4.14–5.80)
RDW: 14.2 % (ref 11.6–15.4)
WBC: 6.2 10*3/uL (ref 3.4–10.8)

## 2019-01-29 LAB — T3, FREE: T3, Free: 2.8 pg/mL (ref 2.0–4.4)

## 2019-01-29 LAB — T4, FREE: FREE T4: 1.13 ng/dL (ref 0.82–1.77)

## 2019-01-29 LAB — T3: T3, Total: 115 ng/dL (ref 71–180)

## 2019-01-29 LAB — TSH: TSH: 0.729 u[IU]/mL (ref 0.450–4.500)

## 2019-01-29 NOTE — Patient Instructions (Signed)
Visit Information  Mr. Kleinfelter and his daughter [name] was given information about Chronic Care Management services today including:  1. CCM service includes personalized support from designated clinical staff supervised by his physician, including individualized plan of care and coordination with other care providers 2. 24/7 contact phone numbers for assistance for urgent and routine care needs. 3. Service will only be billed when office clinical staff spend 20 minutes or more in a month to coordinate care. 4. Only one practitioner may furnish and bill the service in a calendar month. 5. The patient may stop CCM services at any time (effective at the end of the month) by phone call to the office staff. 6. The patient will be responsible for cost sharing (co-pay) of up to 20% of the service fee (after annual deductible is met).  Patient agreed to services and verbal consent obtained.   Face to Face appointment with CCM team member scheduled for: 02/08/19 _0 :00 AM   Barb Merino, RN,CCM Care Management Coordinator [place] [facility]  Direct Phone: [phone]

## 2019-01-29 NOTE — Chronic Care Management (AMB) (Signed)
  Care Management Note   Jesus Lam is a 85 y.o. year old male who is a primary care patient of Audery Amel, [place]. The CM team was consult for assistance with chronic disease management and care coordination.   Review of patient status, including review of consultants reports, relevant laboratory and other test results, and collaboration with appropriate care team members and the patient's provider was performed as part of comprehensive patient evaluation and provision of chronic care management services.   I met [name] in person at the office yesterday (01/28/19) following his OV with Audery Amel, PA-C, for a CCM introduction. [name] consented to CCM and asked that I call [name] for an introduction and to schedule the initial face to face visit. I reached out to [employer] daughter Jesus Lam by phone today.   Jesus Lam and [name] was given information about Chronic Care Management services today including:  1. CCM service includes personalized support from designated clinical staff supervised by his physician, including individualized plan of care and coordination with other care providers 2. 24/7 contact phone numbers for assistance for urgent and routine care needs. 3. Service will only be billed when office clinical staff spend 20 minutes or more in a month to coordinate care. 4. Only one practitioner may furnish and bill the service in a calendar month. 5. The patient may stop CCM services at any time (effective at the end of the month) by phone call to the office staff. 6. The patient will be responsible for cost sharing (co-pay) of up to 20% of the service fee (after annual deductible is met).  Patient and daughter Jesus Lam agreed to services and verbal consent obtained.    Follow Up Plan: Face to Face appointment with CCM team member scheduled for: 02/08/19 _0 :00 AM    Barb Merino, RN,CCM Care Management Coordinator [place]  [facility]  Direct Phone: [phone]

## 2019-02-01 DIAGNOSIS — I639 Cerebral infarction, unspecified: Secondary | ICD-10-CM | POA: Diagnosis not present

## 2019-02-01 DIAGNOSIS — R6889 Other general symptoms and signs: Secondary | ICD-10-CM | POA: Diagnosis not present

## 2019-02-02 DIAGNOSIS — I639 Cerebral infarction, unspecified: Secondary | ICD-10-CM | POA: Diagnosis not present

## 2019-02-03 DIAGNOSIS — I639 Cerebral infarction, unspecified: Secondary | ICD-10-CM | POA: Diagnosis not present

## 2019-02-03 DIAGNOSIS — R6889 Other general symptoms and signs: Secondary | ICD-10-CM | POA: Diagnosis not present

## 2019-02-03 NOTE — Addendum Note (Signed)
Addended by: Radene Knee on: 02/03/2019 06:06 PM   Modules accepted: Orders

## 2019-02-04 ENCOUNTER — Telehealth: Payer: Self-pay

## 2019-02-04 ENCOUNTER — Other Ambulatory Visit: Payer: Self-pay | Admitting: Nurse Practitioner

## 2019-02-04 DIAGNOSIS — I639 Cerebral infarction, unspecified: Secondary | ICD-10-CM | POA: Diagnosis not present

## 2019-02-04 NOTE — Telephone Encounter (Signed)
1st attempt to give lab results  

## 2019-02-04 NOTE — Telephone Encounter (Signed)
-----   Message from Garey Ham, [place] sent at 02/03/2019  1:28 PM EDT ----- Please inform pt that his thyroid studies are all normal, his kidney function is still slow and a little worse than before. Please confirm that his kidney Dr. Is Dr Pearson Grippe so you can send her a copy of his labs.

## 2019-02-05 ENCOUNTER — Ambulatory Visit: Payer: Self-pay

## 2019-02-05 ENCOUNTER — Ambulatory Visit
Admission: RE | Admit: 2019-02-05 | Discharge: 2019-02-05 | Disposition: A | Discharge status: Home / Self Care | Payer: Medicare HMO | Source: Ambulatory Visit | Attending: Internal Medicine | Admitting: Internal Medicine

## 2019-02-05 DIAGNOSIS — R6889 Other general symptoms and signs: Secondary | ICD-10-CM | POA: Diagnosis not present

## 2019-02-05 DIAGNOSIS — E0590 Thyrotoxicosis, unspecified without thyrotoxic crisis or storm: Secondary | ICD-10-CM

## 2019-02-05 DIAGNOSIS — I639 Cerebral infarction, unspecified: Secondary | ICD-10-CM | POA: Diagnosis not present

## 2019-02-05 DIAGNOSIS — R4182 Altered mental status, unspecified: Secondary | ICD-10-CM

## 2019-02-05 DIAGNOSIS — M79641 Pain in right hand: Secondary | ICD-10-CM

## 2019-02-05 DIAGNOSIS — M79642 Pain in left hand: Secondary | ICD-10-CM

## 2019-02-05 DIAGNOSIS — E042 Nontoxic multinodular goiter: Secondary | ICD-10-CM | POA: Diagnosis not present

## 2019-02-05 DIAGNOSIS — I10 Essential (primary) hypertension: Secondary | ICD-10-CM

## 2019-02-05 NOTE — Chronic Care Management (AMB) (Signed)
  Chronic Care Management   Social Work Note  02/05/2019 Name: Tc Alfaro MRN: [id number] DOB: [birth year]  Shiva Eisenstadt is a 85 y.o. year old male who sees Southworth, Viviana Simpler for primary care. Mrs. Southworth asked the CCM team to consult the patient for assistance with chronic disease management and care coordination. SW called patient to reschedule face to face initial consultation to a telephonic encounter.  Follow Up Plan: Telephonic appointment with CCM team members scheduled for: Monday March 16.  Bevelyn Ngo, BSW, CDP [facility] Care Management Social Worker [phone]  Total time spent performing care coordination and/or care management activities with the patient by phone or face to face = 8 minutes.

## 2019-02-08 ENCOUNTER — Ambulatory Visit: Payer: Self-pay

## 2019-02-08 ENCOUNTER — Other Ambulatory Visit: Payer: Self-pay

## 2019-02-08 DIAGNOSIS — R946 Abnormal results of thyroid function studies: Secondary | ICD-10-CM

## 2019-02-08 DIAGNOSIS — R4182 Altered mental status, unspecified: Secondary | ICD-10-CM

## 2019-02-08 DIAGNOSIS — I10 Essential (primary) hypertension: Secondary | ICD-10-CM

## 2019-02-08 DIAGNOSIS — E0590 Thyrotoxicosis, unspecified without thyrotoxic crisis or storm: Secondary | ICD-10-CM

## 2019-02-08 DIAGNOSIS — I639 Cerebral infarction, unspecified: Secondary | ICD-10-CM | POA: Diagnosis not present

## 2019-02-08 NOTE — Patient Instructions (Signed)
Social Worker Visit Information  Goals we discussed today:  Goals Addressed            This Visit's Progress     Patient Stated   . "I don't have advance directives" (pt-stated)       Current Barriers:  . Limited education about the importance of naming a healthcare power of attorney  Clinical Social Work Clinical Goal(s):  Marland Kitchen Over the next 20 days, the patient will review mailed EMMI education on Advance Directive . Over the next 30 days, the patient will complete mailed Advance Directive packet with the assistance of SW . Over the next 45 days, the patient will have Advance Directive notarized and provide a copy to his provider   Interventions: . Patient interviewed and appropriate assessments performed . Mailed the patient an EMMI educational handout on Advance Directives as well as an Emergency planning/management officer . Advised patient to review information mailed by this SW . Provided education and assistance to client regarding Advanced Directives.  Patient Self Care Activities:  . Self administers medications as prescribed . Attends all scheduled provider appointments . Performs ADL's independently . Calls provider office for new concerns or questions  Initial goal documentation         Materials provided: Verbal education about advance directives provided by phone  Jesus Lam was given information about Chronic Care Management services today including:  1. CCM service includes personalized support from designated clinical staff supervised by his physician, including individualized plan of care and coordination with other care providers 2. 24/7 contact phone numbers for assistance for urgent and routine care needs. 3. Service will only be billed when office clinical staff spend 20 minutes or more in a month to coordinate care. 4. Only one practitioner may furnish and bill the service in a calendar month. 5. The patient may stop CCM services at any time (effective at the  end of the month) by phone call to the office staff. 6. The patient will be responsible for cost sharing (co-pay) of up to 20% of the service fee (after annual deductible is met).  Patient agreed to services and verbal consent obtained.   The patient verbalized understanding of instructions provided today and declined a print copy of patient instruction materials.   Follow up plan: SW will follow up with patient by phone over the next three weeks   Jesus Lam, BSW, CDP [facility] [place] [facility] Social Worker [phone]

## 2019-02-08 NOTE — Patient Instructions (Signed)
Visit Information  Goals Addressed      Patient Stated   . "I had an ultrasound of my thyroid last week" (pt-stated)       Current Barriers:  Marland Kitchen Knowledge Deficits related to Thyroid Ultrasound Results plan for further evaluation  Nurse Case Manager Clinical Goal(s):  Marland Kitchen Over the next 30 days, patient will verbalize understanding of plan for evaluation of thyroid nodule  Interventions:   Telephone CCM initial encounter completed with patient and daughter [name] . Evaluation of current treatment plan related to Hyperthyroidism and patient's adherence to plan as established by provider. Steele Sizer with provider Beatrix Fetters, PA-C via in basket message regarding daughter's request to be informed of thyroid U/S results when available   . Confirmed patient established with Endocrinology (per daughter, Dr. Lonzo Cloud manages thyroid disease) . Provided RN CM contact # . Telephone CCM follow up scheduled   Patient Self Care Activities:   Verbalizes understanding of the education/information provided today  Self monitors BP at home . Self administers medications as prescribed . Attends all scheduled provider appointments . Calls pharmacy for medication refills . Attends church or other social activities . Performs ADL's independently . Performs IADL's independently . Calls provider office for new concerns or questions   Initial goal documentation      Patient's daughter stated   . "He checks his BP everyday"       Current Barriers:  Marland Kitchen Knowledge Deficits related to Hypertension disease management  Nurse Case Manager Clinical Goal(s):  Marland Kitchen Over the next 30 days, patient will verbalize understanding of plan for pharmacological management of HTN  Interventions:   Telephone CCM initial encounter completed with patient and his daughter [name] . Evaluation of current treatment plan related to management of HTN and patient's adherence to plan as established by  provider. . Provided education to patient re: disease process for HTN and potential complications if left untreated . Collaborated with provider Beatrix Fetters, PA-C regarding the need for a Nephrology referral  . Advised patient, providing education and rationale, to monitor blood pressure daily and record, calling RN CM and or Beatrix Fetters, PA-C for findings outside established parameters . Assessed for patient and daughter's ability and comfort level with self monitoring BP and recording readings . Provided parameters for target goal for BP, less than 140/90 . Provided RN CM contact # . Telephone CCM follow up scheduled   Patient Self Care Activities:   Verbalizes understanding of the education/information provided today  Self monitors BP at home . Self administers medications as prescribed . Attends all scheduled provider appointments . Calls pharmacy for medication refills . Attends church or other social activities . Performs ADL's independently . Performs IADL's independently . Calls provider office for new concerns or questions  Initial goal documentation     . "I don't always know about his medication changes"       Current Barriers:  . Cognitive Deficits related to altered mental status . Knowledge Deficits related to Medication Management  Nurse Case Manager Clinical Goal(s):  Marland Kitchen Over the next 30 days, patient will verbalize understanding of plan for better medication management including transitioning to blister pill packaging.  . Over the next 30 days, patient/daughter will have a better understanding of patient's current medication regimen including the names of/indication for and dosages needed for all of patient's medications.   Interventions:   Telephone CCM initial encounter completed with patient and daughter [name] . Evaluation of current treatment plan related to prescribed medications  and patient's adherence to plan as established by  provider. . Provided education to patient re: the availability of utilizing a blister pill packaging system via the patient's preferred pharmacy.  . Reviewed medications with patient and daughter Rene KocherRegina and discussed the need for clarification of BP meds Bidil and Chlorthalidone; and Flomax dosage . Collaborated with provider Beatrix FettersSylvia Southworth, PA-C via in basket message re: clarification for use of Bidil, Chlorthalidone (should pt be taking) and Flomax dosing  . Collaboration with [facility] [phone], spoke with P[place]ia to confirm blister pill packaging is available for this patient  . Provided RNCM contact # to daughter Rene KocherRegina . Telephone CCM follow up call scheduled   Patient Self Care Activities:  Trenton Gammon. Verbalizes understanding of the education/information provided today . Self administers medications as prescribed . Attends church or other social activities . Performs ADL's independently . Performs IADL's independently . Calls provider office for new concerns or questions  Initial goal documentation      The patient verbalized understanding of instructions provided today and declined a print copy of patient instruction materials.   The CM team will reach out to the patient again over the next 3-4 days.   Delsa SaleAngel Amilyah Nack, RN,CCM Care Management Coordinator [facility]  Direct Phone: [phone]

## 2019-02-08 NOTE — Chronic Care Management (AMB) (Signed)
Chronic Care Management   Initial Visit Note  02/08/2019 Name: Jesus Lam MRN: [id number] DOB: 03/21/1933  Referred by: Jesus Amel, PA-C Reason for referral : Chronic Care Management ([place] )   Jesus Lam is a 85 y.o. year old male who is a primary care patient of Jesus Lam, [place]. The CCM team was consulted for assistance with chronic disease management and care coordination needs.   Review of patient status, including review of consultants reports, relevant laboratory and other test results, and collaboration with appropriate care team members and the patient's provider was performed as part of comprehensive patient evaluation and provision of chronic care management services.    Objective:  Lab Results  Component Value Date   HGBA1C 5.3 06/13/2018   Lab Results  Component Value Date   LDLCALC Comment 10/29/2018   CREATININE 1.51 (H) 01/28/2019   BP Readings from Last 3 Encounters:  01/28/19 130/68  01/28/19 130/68  01/04/19 138/65   I spoke with the patient and [name] today for the CCM initial encounter.   Goals Addressed      Patient Stated   . "I had an ultrasound of my thyroid last week" (pt-stated)       Current Barriers:  Marland Kitchen Knowledge Deficits related to Thyroid Ultrasound Results plan for further evaluation  Nurse Case Manager Clinical Goal(s):  Marland Kitchen Over the next 30 days, patient will verbalize understanding of plan for evaluation of thyroid nodule  Interventions:   Telephone CCM initial encounter completed with patient and daughter Jesus Lam . Evaluation of current treatment plan related to Hyperthyroidism and patient's adherence to plan as established by provider. Nash Dimmer with provider Jesus Amel, PA-C via in basket message regarding daughter's request to be informed of thyroid U/S results when available   . Confirmed patient established with Endocrinology (per daughter, Dr. Kelton Pillar manages  thyroid disease) . Provided RN CM contact # . Telephone CCM follow up scheduled   Patient Self Care Activities:   Verbalizes understanding of the education/information provided today  Self monitors BP at home . Self administers medications as prescribed . Attends all scheduled provider appointments . Calls pharmacy for medication refills . Attends church or other social activities . Performs ADL's independently . Performs IADL's independently . Calls provider office for new concerns or questions  Initial goal documentation      Patient's daughter stated   . "He checks his BP everyday"       Current Barriers:  Marland Kitchen Knowledge Deficits related to Hypertension disease management  Nurse Case Manager Clinical Goal(s):  Marland Kitchen Over the next 30 days, patient will verbalize understanding of plan for pharmacological management of HTN  Interventions:   Telephone CCM initial encounter completed with patient and [name] . Evaluation of current treatment plan related to management of HTN and patient's adherence to plan as established by provider. . Provided education to patient re: disease process for HTN and potential complications if left untreated . Collaborated with provider Jesus Amel, PA-C regarding the need for a Nephrology referral  . Advised patient, providing education and rationale, to monitor blood pressure daily and record, calling RN CM and or Jesus Amel, PA-C for findings outside established parameters . Assessed for patient and daughter's ability and comfort level with self monitoring BP and recording readings . Provided parameters for target goal for BP, less than 140/90 . Provided RN CM contact # . Telephone CCM follow up scheduled   Patient Self Care Activities:  Verbalizes understanding of the education/information provided today  Self monitors BP at home . Self administers medications as prescribed . Attends all scheduled provider appointments  . Calls pharmacy for medication refills . Attends church or other social activities . Performs ADL's independently . Performs IADL's independently . Calls provider office for new concerns or questions  Initial goal documentation    . "I don't always know about his medication changes"       Current Barriers:  . Cognitive Deficits related to altered mental status . Knowledge Deficits related to Medication Management  Nurse Case Manager Clinical Goal(s):  Marland Kitchen Over the next 30 days, patient will verbalize understanding of plan for better medication management including transitioning to blister pill packaging.  . Over the next 30 days, patient/daughter will have a better understanding of patient's current medication regimen including the names of/indication for and dosages needed for all of patient's medications.   Interventions:   Telephone CCM initial encounter completed with patient and daughter Jesus Lam . Evaluation of current treatment plan related to prescribed medications and patient's adherence to plan as established by provider. . Provided education to patient re: the availability of utilizing a blister pill packaging system via the patient's preferred pharmacy.  . Reviewed medications with patient and daughter Jesus Lam and discussed the need for clarification of BP meds Bidil and Chlorthalidone; and Flomax dosage . Collaborated with provider Jesus Amel, PA-C via in basket message re: clarification for use of Bidil, Chlorthalidone (should pt be taking) and Flomax dosing  . Collaboration with [employer] [phone], spoke with Porshia to confirm blister pill packaging is available for this patient  . Provided RNCM contact # to daughter Jesus Lam . Telephone CCM follow up call scheduled   Patient Self Care Activities:  Rosezena Sensor understanding of the education/information provided today . Self administers medications as prescribed . Attends church or other social  activities . Performs ADL's independently . Performs IADL's independently . Calls provider office for new concerns or questions  Initial goal documentation      [name] and daughter Jesus Lam was given information about Chronic Care Management services today including:  1. CCM service includes personalized support from designated clinical staff supervised by his physician, including individualized plan of care and coordination with other care providers 2. 24/7 contact phone numbers for assistance for urgent and routine care needs. 3. Service will only be billed when office clinical staff spend 20 minutes or more in a month to coordinate care. 4. Only one practitioner may furnish and bill the service in a calendar month. 5. The patient may stop CCM services at any time (effective at the end of the month) by phone call to the office staff. 6. The patient will be responsible for cost sharing (co-pay) of up to 20% of the service fee (after annual deductible is met).  Patient agreed to services and verbal consent obtained.   The CM team will reach out to the patient again over the next 3-4  days.   Barb Merino, RN,CCM Care Management Coordinator [place] [facility]  Direct Phone: [phone]

## 2019-02-08 NOTE — Chronic Care Management (AMB) (Signed)
  Chronic Care Management    Clinical Social Work General Note  02/08/2019 Name: Jesus Lam MRN: [id number] DOB: [birth year]  Leor Dinse is a 85 y.o. year old male who is a primary care patient of Valetta Fuller. The CCM was consulted to assist the patient with chronic disease management and care coordination.   Review of patient status, including review of consultants reports, relevant laboratory and other test results, and collaboration with appropriate care team members and the patient's provider was performed as part of comprehensive patient evaluation and provision of chronic care management services.   I reached out to the patient and his daughter today with CCM RN CM Lawanna Kobus Little during scheduled phone assessment. The patient reports he lives with his daughter and receives income by social security as well as benefits from food and nutrition services. The patient currently utilizes transportation services provided by [employer] as well as his [employer] transportation benefit. The patient attends a Building surveyor for engagement.  SDOH (Social Determinants of Health) screening performed today. See Care Plan Entry related to challenges with: None  Goals Addressed            This Visit's Progress     Patient Stated   . "I don't have advance directives" (pt-stated)       Current Barriers:  . Limited education about the importance of naming a healthcare power of attorney  Clinical Social Work Clinical Goal(s):  Marland Kitchen Over the next 20 days, the patient will review mailed EMMI education on Advance Directive . Over the next 30 days, the patient will complete mailed Advance Directive packet with the assistance of SW . Over the next 45 days, the patient will have Advance Directive notarized and provide a copy to his provider   Interventions: . Patient interviewed and appropriate assessments performed . Mailed the patient an EMMI educational handout on Advance Directives as well as an  Engineer, production . Advised patient to review information mailed by this SW . Provided education and assistance to client regarding Advanced Directives.  Patient Self Care Activities:  . Self administers medications as prescribed . Attends all scheduled provider appointments . Performs ADL's independently . Calls provider office for new concerns or questions  Initial goal documentation         Follow Up Plan: SW will follow up with patient by phone over the next three weeks to assist with the completion of Advance Directive       Mammie Russian, CDP [facility] Care Management Social Worker [phone]  Total time spent performing care coordination and/or care management activities with the patient by phone or face to face = 30 minutes.

## 2019-02-09 ENCOUNTER — Encounter: Payer: Self-pay | Admitting: Internal Medicine

## 2019-02-09 ENCOUNTER — Other Ambulatory Visit: Payer: Self-pay | Admitting: Internal Medicine

## 2019-02-09 DIAGNOSIS — I639 Cerebral infarction, unspecified: Secondary | ICD-10-CM | POA: Diagnosis not present

## 2019-02-09 DIAGNOSIS — E042 Nontoxic multinodular goiter: Secondary | ICD-10-CM

## 2019-02-09 NOTE — Progress Notes (Signed)
Social history updated

## 2019-02-09 NOTE — Progress Notes (Signed)
Thyroid biopsy ordered

## 2019-02-10 ENCOUNTER — Telehealth: Payer: Self-pay

## 2019-02-10 DIAGNOSIS — I639 Cerebral infarction, unspecified: Secondary | ICD-10-CM | POA: Diagnosis not present

## 2019-02-11 DIAGNOSIS — I639 Cerebral infarction, unspecified: Secondary | ICD-10-CM | POA: Diagnosis not present

## 2019-02-12 DIAGNOSIS — I639 Cerebral infarction, unspecified: Secondary | ICD-10-CM | POA: Diagnosis not present

## 2019-02-15 DIAGNOSIS — I639 Cerebral infarction, unspecified: Secondary | ICD-10-CM | POA: Diagnosis not present

## 2019-02-16 DIAGNOSIS — I639 Cerebral infarction, unspecified: Secondary | ICD-10-CM | POA: Diagnosis not present

## 2019-02-17 DIAGNOSIS — I639 Cerebral infarction, unspecified: Secondary | ICD-10-CM | POA: Diagnosis not present

## 2019-02-18 ENCOUNTER — Telehealth: Payer: Self-pay

## 2019-02-18 DIAGNOSIS — I639 Cerebral infarction, unspecified: Secondary | ICD-10-CM | POA: Diagnosis not present

## 2019-02-19 ENCOUNTER — Telehealth: Payer: Self-pay

## 2019-02-23 ENCOUNTER — Ambulatory Visit: Payer: Self-pay

## 2019-02-23 ENCOUNTER — Telehealth: Payer: Self-pay

## 2019-02-23 DIAGNOSIS — R4182 Altered mental status, unspecified: Secondary | ICD-10-CM

## 2019-02-23 DIAGNOSIS — E042 Nontoxic multinodular goiter: Secondary | ICD-10-CM

## 2019-02-23 DIAGNOSIS — I10 Essential (primary) hypertension: Secondary | ICD-10-CM

## 2019-02-23 NOTE — Chronic Care Management (AMB) (Signed)
  Chronic Care Management   Outreach Note  02/23/2019 Name: Jesus Lam MRN: [id number] DOB: 06-Sep-1933  Referred by: Beatrix Fetters PA-C Reason for referral : Chronic Care Management (TELEPHONE CCM FOLLOW UP )   An unsuccessful telephone outreach was attempted today. The patient was referred to the case management team by Beatrix Fetters PA-C for assistance with Essential Hypertension, Altered Mental Status and Abnormal Thyroid function.   I attempted patient's daughter [name] by telephone today. She states now is not a good time due to she is working. She requested a call back tomorrow am.   Follow Up Plan: The CM team will reach out to the patient again tomorrow am per daughter's request.    [name], RN,CCM Care Management Coordinator [facility]  Direct Phone: [phone]

## 2019-02-24 ENCOUNTER — Ambulatory Visit (INDEPENDENT_AMBULATORY_CARE_PROVIDER_SITE_OTHER): Payer: Medicare HMO

## 2019-02-24 ENCOUNTER — Other Ambulatory Visit: Payer: Self-pay

## 2019-02-24 ENCOUNTER — Telehealth: Payer: Self-pay

## 2019-02-24 DIAGNOSIS — R4182 Altered mental status, unspecified: Secondary | ICD-10-CM

## 2019-02-24 DIAGNOSIS — R946 Abnormal results of thyroid function studies: Secondary | ICD-10-CM

## 2019-02-24 DIAGNOSIS — E042 Nontoxic multinodular goiter: Secondary | ICD-10-CM

## 2019-02-24 DIAGNOSIS — N189 Chronic kidney disease, unspecified: Secondary | ICD-10-CM | POA: Diagnosis not present

## 2019-02-24 DIAGNOSIS — I10 Essential (primary) hypertension: Secondary | ICD-10-CM | POA: Diagnosis not present

## 2019-02-24 DIAGNOSIS — E0789 Other specified disorders of thyroid: Secondary | ICD-10-CM

## 2019-02-24 DIAGNOSIS — F09 Unspecified mental disorder due to known physiological condition: Secondary | ICD-10-CM | POA: Diagnosis not present

## 2019-02-24 NOTE — Chronic Care Management (AMB) (Signed)
Chronic Care Management   Follow Up Note   02/24/2019 Name: Abdulrhman Avino MRN: [id number] DOB: 1933/09/16  Referred by: Beatrix Fetters PA-C Reason for referral : Chronic Care Management (TELEPHONE CCM FOLLOW UP )   Aydn Bolivar is a 85 y.o. year old male who is a primary care patient of [name], FNP. The CCM team was consulted for assistance with chronic disease management and care coordination needs.    Review of patient status, including review of consultants reports, relevant laboratory and other test results, and collaboration with appropriate care team members and the patient's provider was performed as part of comprehensive patient evaluation and provision of chronic care management services.    I spoke with patient's daughter [name] by telephone today.   Goals Addressed      Patient Stated   . "I don't have advance directives" (pt-stated)   Not on track    Current Barriers:  . Limited education about the importance of naming a healthcare power of attorney  Clinical Social Work Clinical Goal(s):  Marland Kitchen Over the next 20 days, the patient will review mailed EMMI education on Advance Directive . Over the next 30 days, the patient will complete mailed Advance Directive packet with the assistance of SW . Over the next 45 days, the patient will have Advance Directive notarized and provide a copy to his provider   Interventions:  Telephone CCM follow up with daughter [name] today  Assessed for receipt of Advanced directives mailed to patient by Mammie Russian (dtr unsure if packet was received, she will check at home and will notify the CCM team if needs to be resent)  Scheduled CCM telephone follow up with daughter   Patient Self Care Activities:  . Self administers medications as prescribed . Attends all scheduled provider appointments . Performs ADL's independently . Calls provider office for new concerns or questions  Please see past updates related to this  goal by clicking on the "Past Updates" button in the selected goal     . "I had an ultrasound of my thyroid last week" (pt-stated)   On track    Current Barriers:  Marland Kitchen Knowledge Deficits related to Thyroid Ultrasound Results plan for further evaluation  Nurse Case Manager Clinical Goal(s):  Marland Kitchen Over the next 30 days, patient will verbalize understanding of plan for evaluation of thyroid nodule  Interventions:   Telephone CCM follow up completed with patient's daughter [name] today  Advised daughter of thyroid biopsy scheduled, provided the indication for the biopsy as well as time and date  Discussed with daughter Sabino Snipes recommendation for patient to follow up with established Endocrinologist for evaluation of thyroid disease and nodules (daughter will call Dr. Harvel Ricks office to schedule an appointment)  Answered daughter's questions related to thyroid disease and nodules . Scheduled CCM telephone follow up call with daughter [name]  Patient Self Care Activities:   Daughter verbalizes understanding of the education/information provided today  Self monitors BP at home . Self administers medications as prescribed . Attends all scheduled provider appointments . Calls pharmacy for medication refills . Attends church or other social activities . Performs ADL's independently . Performs IADL's independently . Calls provider office for new concerns or questions   Please see past updates related to this goal by clicking on the "Past Updates" button in the selected goal       Other   . COMPLETED: "He checks his BP everyday"       PATIENT'S DAUGHTER STATED  Current Barriers:  .  Knowledge Deficits related to Hypertension disease management  Nurse Case Manager Clinical Goal(s):  Marland Kitchen. Over the next 30 days, patient will verbalize understanding of plan for pharmacological management of HTN  Interventions:   Telephone CCM follow up call completed patient's daughter Reece AgarRegina Sword  today  Assessed for patient understanding and adherence with taking his BP meds exactly as prescribed (dtr states pt checks his BP daily; most recent BP recorded; 138/59 HR 59; 111/66 HR 74; 119/62 HR 65)  Assessed for questions related disease process for Hypertension  Reinforced importance of following a low sodium diet and staying well hydrated . Goal Completed  Patient Self Care Activities:   Verbalizes understanding of the education/information provided today  Self monitors BP at home . Self administers medications as prescribed . Attends all scheduled provider appointments . Calls pharmacy for medication refills . Attends church or other social activities . Performs ADL's independently . Performs IADL's independently . Calls provider office for new concerns or questions  Please see past updates related to this goal by clicking on the "Past Updates" button in the selected goal     . "I don't always know about his medication changes"   On track    PATIENT'S DAUGHTER STATED  Current Barriers:  . Cognitive Deficits related to altered mental status . Knowledge Deficits related to Medication Management  Nurse Case Manager Clinical Goal(s):  Marland Kitchen. Over the next 30 days, patient will verbalize understanding of plan for better medication management including transitioning to blister pill packaging.  . Over the next 30 days, patient/daughter will have a better understanding of patient's current medication regimen including the names of/indication for and dosages needed for all of patient's medications.   Interventions:   Telephone CCM follow up call completed with daughter Rene KocherRegina  Assessed for medication understanding and adherence  Instructed daughter on taking patient's medications to his pharmacy to have the blister pill pack system set up once she is ready   Advised the pharmacy tech Daniel Nonesortia confirmed this service is provided and can be done at any time during the month the  patient/family desires  Discussed with daughter Rene KocherRegina, no medications changes were recommended by PCP at this time  Encouraged daughter to contact the CCM team if needed before next scheduled call  Confirmed Rene KocherRegina has the contact # for the CCM team and she confirmed . Telephone CCM follow up call scheduled   Patient Self Care Activities:  . Daughter verbalizes understanding of the education/information provided today . Self administers medications as prescribed . Attends church or other social activities . Performs ADL's independently . Performs IADL's independently . Calls provider office for new concerns or questions  Please see past updates related to this goal by clicking on the "Past Updates" button in the selected goal     . "Needs Nephrology referral for elevated creatinine"        Per provider, Beatrix FettersSylvia Southworth, PA-C  Current Barriers:  Marland Kitchen. Knowledge Deficits related to Chronic Kidney Disease process and treatment  Nurse Case Manager Clinical Goal(s):  Marland Kitchen. Over the next 30 days, patient/daughter will verbalize basic understanding of Chronic Kidney disease process and self health management plan as evidenced by patient and daughter Rene KocherRegina will verbalize increased knowledge and understanding of importance of patient following up with a Nephrologist to evaluate and treat patient's Chronic Kidney disease.  . Over the next 30 days, patient will have a referral in place to follow up with a Nephrologist to evaluate and treat elevated serum creatinine.  Interventions:   Telephone CCM follow up call completed with patient's daughter Rene KocherRegina today . Instructed daughter of importance of patient staying well hydrated to help dehydration and or strain on kidney . Instructed daughter about recommendation for patient to f/u with a Nephrologist . Collaboration via in basket message sent to provider Beatrix FettersSylvia Southworth, PA-C requesting a Nephrology referral  . Scheduled a CCM follow up  telephone call with daughter Rene KocherRegina  Patient Self Care Activities:   Daughter Gayleen OremRegina verbalizes understanding of instructions/education provided today  . Self administers medications as prescribed . Attends all scheduled provider appointments . Performs ADL's independently . Calls provider office for new concerns or questions  Initial goal documentation    The CCM team will reach out to the patient again over the next 2-3 weeks.   Delsa SaleAngel Smiley Birr, RN,CCM Care Management Coordinator [facility]  Direct Phone: [phone]

## 2019-02-25 ENCOUNTER — Other Ambulatory Visit: Payer: Self-pay | Admitting: Internal Medicine

## 2019-02-25 DIAGNOSIS — N289 Disorder of kidney and ureter, unspecified: Secondary | ICD-10-CM

## 2019-02-25 NOTE — Progress Notes (Unsigned)
Ask her if she dispenses his meds or him and please update his medicine list, there is a question about his prostate medication that Case Management was asking, but I dont know him well enough and Lolita Cram said she has seen him long time ago to remember.

## 2019-02-25 NOTE — Patient Instructions (Signed)
Visit Information  Goals Addressed      Patient Stated   . "I don't have advance directives" (pt-stated)   Not on track    Current Barriers:  . Limited education about the importance of naming a healthcare power of attorney  Clinical Social Work Clinical Goal(s):  Marland Kitchen Over the next 20 days, the patient will review mailed EMMI education on Advance Directive . Over the next 30 days, the patient will complete mailed Advance Directive packet with the assistance of SW . Over the next 45 days, the patient will have Advance Directive notarized and provide a copy to his provider   Interventions:  Telephone CCM follow up with daughter [name] today  Assessed for receipt of Advanced directives mailed to patient by Mammie Russian (dtr unsure if packet was received, she will check at home and will notify the CCM team if needs to be resent)  Scheduled CCM telephone follow up with daughter   Patient Self Care Activities:  . Self administers medications as prescribed . Attends all scheduled provider appointments . Performs ADL's independently . Calls provider office for new concerns or questions  Please see past updates related to this goal by clicking on the "Past Updates" button in the selected goal     . "I had an ultrasound of my thyroid last week" (pt-stated)   On track    Current Barriers:  Marland Kitchen Knowledge Deficits related to Thyroid Ultrasound Results plan for further evaluation  Nurse Case Manager Clinical Goal(s):  Marland Kitchen Over the next 30 days, patient will verbalize understanding of plan for evaluation of thyroid nodule  Interventions:   Telephone CCM follow up completed with patient's daughter [name] today  Advised daughter of thyroid biopsy scheduled, provided the indication for the biopsy as well as time and date  Discussed with daughter Sabino Snipes recommendation for patient to follow up with established Endocrinologist for evaluation of thyroid disease and nodules (daughter will call  Dr. Harvel Ricks office to schedule an appointment)  Answered daughter's questions related to thyroid disease and nodules . Scheduled CCM telephone follow up call with daughter [name]  Patient Self Care Activities:   Daughter verbalizes understanding of the education/information provided today  Self monitors BP at home . Self administers medications as prescribed . Attends all scheduled provider appointments . Calls pharmacy for medication refills . Attends church or other social activities . Performs ADL's independently . Performs IADL's independently . Calls provider office for new concerns or questions   Please see past updates related to this goal by clicking on the "Past Updates" button in the selected goal       Other   . COMPLETED: "He checks his BP everyday"       PATIENT'S DAUGHTER STATED  Current Barriers:  Marland Kitchen Knowledge Deficits related to Hypertension disease management  Nurse Case Manager Clinical Goal(s):  Marland Kitchen Over the next 30 days, patient will verbalize understanding of plan for pharmacological management of HTN  Interventions:   Telephone CCM follow up call completed patient's daughter Milik Fabregas today  Assessed for patient understanding and adherence with taking his BP meds exactly as prescribed (dtr states pt checks his BP daily; most recent BP recorded; 138/59 HR 59; 111/66 HR 74; 119/62 HR 65)  Assessed for questions related disease process for Hypertension  Reinforced importance of following a low sodium diet and staying well hydrated . Goal Completed  Patient Self Care Activities:   Verbalizes understanding of the education/information provided today  Self monitors BP at  home . Self administers medications as prescribed . Attends all scheduled provider appointments . Calls pharmacy for medication refills . Attends church or other social activities . Performs ADL's independently . Performs IADL's independently . Calls provider office for new  concerns or questions  Please see past updates related to this goal by clicking on the "Past Updates" button in the selected goal     . "I don't always know about his medication changes"   On track    PATIENT'S DAUGHTER STATED  Current Barriers:  . Cognitive Deficits related to altered mental status . Knowledge Deficits related to Medication Management  Nurse Case Manager Clinical Goal(s):  Marland Kitchen Over the next 30 days, patient will verbalize understanding of plan for better medication management including transitioning to blister pill packaging.  . Over the next 30 days, patient/daughter will have a better understanding of patient's current medication regimen including the names of/indication for and dosages needed for all of patient's medications.   Interventions:   Telephone CCM follow up call completed with daughter [name]  Assessed for medication understanding and adherence  Instructed daughter on taking patient's medications to his pharmacy to have the blister pill pack system set up once she is ready   Advised the pharmacy tech Daniel Nones confirmed this service is provided and can be done at any time during the month the patient/family desires  Discussed with daughter [name], no medications changes were recommended by PCP at this time  Encouraged daughter to contact the CCM team if needed before next scheduled call  Confirmed [name] has the contact # for the CCM team and she confirmed . Telephone CCM follow up call scheduled   Patient Self Care Activities:  . Daughter verbalizes understanding of the education/information provided today . Self administers medications as prescribed . Attends church or other social activities . Performs ADL's independently . Performs IADL's independently . Calls provider office for new concerns or questions  Please see past updates related to this goal by clicking on the "Past Updates" button in the selected goal     . "Needs Nephrology referral  for elevated creatinine"        Per provider, Beatrix Fetters, PA-C  Current Barriers:  Marland Kitchen Knowledge Deficits related to Chronic Kidney Disease process and treatment  Nurse Case Manager Clinical Goal(s):  Marland Kitchen Over the next 30 days, patient/daughter will verbalize basic understanding of Chronic Kidney disease process and self health management plan as evidenced by patient and daughter [name] will verbalize increased knowledge and understanding of importance of patient following up with a Nephrologist to evaluate and treat patient's Chronic Kidney disease.  . Over the next 30 days, patient will have a referral in place to follow up with a Nephrologist to evaluate and treat elevated serum creatinine.   Interventions:   Telephone CCM follow up call completed with patient's daughter [name] today . Instructed daughter of importance of patient staying well hydrated to help dehydration and or strain on kidney . Instructed daughter about recommendation for patient to f/u with a Nephrologist . Collaboration via in basket message sent to provider Beatrix Fetters, PA-C requesting a Nephrology referral  . Scheduled a CCM follow up telephone call with daughter [name]  Patient Self Care Activities:   Daughter Gayleen Orem understanding of instructions/education provided today  . Self administers medications as prescribed . Attends all scheduled provider appointments . Performs ADL's independently . Calls provider office for new concerns or questions  Initial goal documentation  The patient verbalized understanding of instructions provided today and declined a print copy of patient instruction materials.   The CM team will reach out to the patient again over the next 2-3 weeks.   Delsa SaleAngel Zaydyn Havey, RN,CCM Care Management Coordinator [facility]  Direct Phone: [phone]

## 2019-03-01 ENCOUNTER — Ambulatory Visit: Payer: Self-pay

## 2019-03-01 ENCOUNTER — Telehealth: Payer: Self-pay

## 2019-03-01 DIAGNOSIS — I10 Essential (primary) hypertension: Secondary | ICD-10-CM

## 2019-03-01 DIAGNOSIS — R946 Abnormal results of thyroid function studies: Secondary | ICD-10-CM

## 2019-03-01 DIAGNOSIS — R4182 Altered mental status, unspecified: Secondary | ICD-10-CM

## 2019-03-01 NOTE — Chronic Care Management (AMB) (Signed)
  Chronic Care Management   Outreach Note  03/01/2019 Name: Jesus Lam MRN: [id number] DOB: 1933/05/24  Referred by: [name], FNP Reason for referral : Care Coordination   Unsuccessful outreach to the patient and his daughter, Jesus Lam in efforts of reviewing advance directive forms. SW left a HIPAA compliant voice message requesting a return call.  Follow Up Plan: The CM team will reach out to the patient again over the next 7 days.    Bevelyn Ngo, BSW, CDP [facility] Care Management Social Worker [phone]  Total time spent performing care coordination and/or care management activities with the patient by phone or face to face = 5 minutes.

## 2019-03-08 ENCOUNTER — Ambulatory Visit: Payer: Self-pay

## 2019-03-08 ENCOUNTER — Telehealth: Payer: Self-pay

## 2019-03-08 DIAGNOSIS — I10 Essential (primary) hypertension: Secondary | ICD-10-CM

## 2019-03-08 DIAGNOSIS — R4182 Altered mental status, unspecified: Secondary | ICD-10-CM

## 2019-03-08 NOTE — Chronic Care Management (AMB) (Signed)
  Chronic Care Management   Outreach Note  03/08/2019 Name: Jesus Lam MRN: [id number] DOB: 08-16-1933  Referred by: [name], FNP Reason for referral : Care Coordination   Second unsuccessful telephone outreach was attempted today to assist with the completion of advance directives. No voice mailbox established prohibiting SW from leaving a HIPAA compliant voice message.  Follow Up Plan: The CM team will reach out to the patient again over the next 7 - 10 days.   Bevelyn Ngo, BSW, CDP [facility] Care Management Social Worker [phone]  Total time spent performing care coordination and/or care management activities with the patient by phone or face to face = 5 minutes.

## 2019-03-12 ENCOUNTER — Ambulatory Visit: Payer: Self-pay

## 2019-03-12 ENCOUNTER — Telehealth: Payer: Self-pay

## 2019-03-12 DIAGNOSIS — R946 Abnormal results of thyroid function studies: Secondary | ICD-10-CM

## 2019-03-12 DIAGNOSIS — I10 Essential (primary) hypertension: Secondary | ICD-10-CM

## 2019-03-12 DIAGNOSIS — R4182 Altered mental status, unspecified: Secondary | ICD-10-CM

## 2019-03-12 NOTE — Chronic Care Management (AMB) (Signed)
  Chronic Care Management    Clinical Social Work Follow Up Note  03/12/2019 Name: Jesus Lam MRN: [id number] DOB: 06-24-1933  Jesus Lam is a 85 y.o. year old male who is a primary care patient of Minette Brine, [place]. The CCM team was consulted for assistance with chronic care management and care coordination.  Review of patient status, including review of consultants reports, other relevant assessments, and collaboration with appropriate care team members and the patient's provider was performed as part of comprehensive patient evaluation and provision of chronic care management services.     I placed a follow up call to the patients daughter to assess the patients progression in completing an advance directive.   Goals Addressed            This Visit's Progress     Patient Stated   . COMPLETED: "I don't have advance directives" (pt-stated) This goal has not been met       Current Barriers:  . Limited education about the importance of naming a healthcare power of attorney  Clinical Social Work Clinical Goal(s):  Marland Kitchen Over the next 20 days, the patient will review mailed EMMI education on Advance Directive . Over the next 30 days, the patient will complete mailed Advance Directive packet with the assistance of SW . Over the next 45 days, the patient will have Advance Directive notarized and provide a copy to his provider   Interventions:  Telephone CCM follow up with daughter [name] today  Provided brief education surrounding how to complete advance directives  Patients daughter denies questions surrounding document at this time  Encouraged patients daughter to contact this SW directly if assistance is needed  Advised patients daughter to provide Dr. Baird [facility] office with a copy once the document is completed  Patient Self Care Activities:  . Self administers medications as prescribed . Attends all scheduled provider appointments . Performs ADL's independently . Calls  provider office for new concerns or questions  Please see past updates related to this goal by clicking on the "Past Updates" button in the selected goal        Follow Up Plan: No further SW needs at this time. SW will remain an active member of the patients care team should further SW assistance be needed.   Daneen Schick, BSW, CDP [facility] Care Management Social Worker [phone]  Total time spent performing care coordination and/or care management activities with the patient by phone or face to face = 10 minutes.

## 2019-03-12 NOTE — Patient Instructions (Signed)
Social Worker Visit Information  Goals we discussed today:  Goals Addressed            This Visit's Progress     Patient Stated   . COMPLETED: "I don't have advance directives" (pt-stated)       Current Barriers:  . Limited education about the importance of naming a healthcare power of attorney  Clinical Social Work Clinical Goal(s):  Marland Kitchen Over the next 20 days, the patient will review mailed EMMI education on Advance Directive . Over the next 30 days, the patient will complete mailed Advance Directive packet with the assistance of SW . Over the next 45 days, the patient will have Advance Directive notarized and provide a copy to his provider   Interventions:  Telephone CCM follow up with daughter [name] today  Provided brief education surrounding how to complete advance directives  Patients daughter denies questions surrounding document at this time  Encouraged patients daughter to contact this SW directly if assistance is needed  Advised patients daughter to provide Dr. Allyne Gee office with a copy once the document is completed  Patient Self Care Activities:  . Self administers medications as prescribed . Attends all scheduled provider appointments . Performs ADL's independently . Calls provider office for new concerns or questions  Please see past updates related to this goal by clicking on the "Past Updates" button in the selected goal          Materials Provided: No, declined further education of Advance Directive  Follow Up Plan: Client will outreach this SW if future assistance is needed.   Bevelyn Ngo, BSW, CDP [facility] Care Management Social Worker [phone]

## 2019-03-18 ENCOUNTER — Other Ambulatory Visit: Payer: Medicare HMO

## 2019-03-19 ENCOUNTER — Telehealth: Payer: Self-pay

## 2019-03-19 ENCOUNTER — Ambulatory Visit: Payer: Self-pay | Admitting: *Deleted

## 2019-03-19 DIAGNOSIS — I10 Essential (primary) hypertension: Secondary | ICD-10-CM

## 2019-03-19 DIAGNOSIS — R4182 Altered mental status, unspecified: Secondary | ICD-10-CM

## 2019-03-19 NOTE — Chronic Care Management (AMB) (Signed)
  Chronic Care Management   Outreach Note  03/19/2019 Name: Jesus Lam MRN: [id number] DOB: [birth year]  Referred by: [name], FNP Reason for referral : Chronic Care Management (Telephone Follow Up Unsuccessful)  An unsuccessful telephone outreach was attempted today. The patient was referred to the case management team by for assistance with Dementia, Hypertension, CKD, thyroid disease.  Follow Up Plan: A HIPPA compliant phone message was left for the patient providing contact information and requesting a return call.  The CM team will reach out to the patient again over the next 7 days.   Marja Kays [facility] Nurse Care Coordinator [facility] Care Management [phone]

## 2019-03-26 ENCOUNTER — Telehealth: Payer: Self-pay

## 2019-03-31 ENCOUNTER — Telehealth: Payer: Self-pay

## 2019-04-05 ENCOUNTER — Other Ambulatory Visit: Payer: Self-pay | Admitting: Nurse Practitioner

## 2019-04-05 ENCOUNTER — Other Ambulatory Visit: Payer: Self-pay | Admitting: Cardiology

## 2019-04-05 NOTE — Telephone Encounter (Signed)
Please fill

## 2019-04-07 ENCOUNTER — Telehealth: Payer: Self-pay

## 2019-04-14 ENCOUNTER — Telehealth: Payer: Self-pay

## 2019-04-15 ENCOUNTER — Other Ambulatory Visit: Payer: Self-pay | Admitting: Internal Medicine

## 2019-04-15 ENCOUNTER — Ambulatory Visit
Admission: RE | Admit: 2019-04-15 | Discharge: 2019-04-15 | Disposition: A | Discharge status: Home / Self Care | Payer: Medicare HMO | Source: Ambulatory Visit | Attending: Nurse Practitioner | Admitting: Nurse Practitioner

## 2019-04-15 ENCOUNTER — Other Ambulatory Visit: Payer: Medicare HMO

## 2019-04-15 ENCOUNTER — Other Ambulatory Visit (HOSPITAL_COMMUNITY)
Admission: RE | Admit: 2019-04-15 | Discharge: 2019-04-15 | Disposition: A | Discharge status: Home / Self Care | Payer: Medicare HMO | Source: Ambulatory Visit | Attending: Radiology | Admitting: Radiology

## 2019-04-15 ENCOUNTER — Other Ambulatory Visit: Payer: Self-pay | Admitting: Nurse Practitioner

## 2019-04-15 ENCOUNTER — Other Ambulatory Visit: Payer: Self-pay | Admitting: *Deleted

## 2019-04-15 DIAGNOSIS — E042 Nontoxic multinodular goiter: Secondary | ICD-10-CM | POA: Diagnosis not present

## 2019-04-15 DIAGNOSIS — E041 Nontoxic single thyroid nodule: Secondary | ICD-10-CM | POA: Diagnosis not present

## 2019-04-20 ENCOUNTER — Other Ambulatory Visit: Payer: Medicare HMO

## 2019-04-23 DIAGNOSIS — I639 Cerebral infarction, unspecified: Secondary | ICD-10-CM | POA: Diagnosis not present

## 2019-04-26 DIAGNOSIS — I639 Cerebral infarction, unspecified: Secondary | ICD-10-CM | POA: Diagnosis not present

## 2019-04-27 DIAGNOSIS — I639 Cerebral infarction, unspecified: Secondary | ICD-10-CM | POA: Diagnosis not present

## 2019-04-28 ENCOUNTER — Encounter: Payer: Self-pay | Admitting: Cardiology

## 2019-04-28 ENCOUNTER — Ambulatory Visit: Payer: Medicare HMO | Admitting: Cardiology

## 2019-04-28 ENCOUNTER — Other Ambulatory Visit: Payer: Self-pay

## 2019-04-28 VITALS — BP 160/82 | HR 77 | Ht 72.0 in | Wt 250.0 lb

## 2019-04-28 DIAGNOSIS — I639 Cerebral infarction, unspecified: Secondary | ICD-10-CM | POA: Diagnosis not present

## 2019-04-28 DIAGNOSIS — E781 Pure hyperglyceridemia: Secondary | ICD-10-CM

## 2019-04-28 DIAGNOSIS — I10 Essential (primary) hypertension: Secondary | ICD-10-CM | POA: Diagnosis not present

## 2019-04-28 DIAGNOSIS — E782 Mixed hyperlipidemia: Secondary | ICD-10-CM | POA: Diagnosis not present

## 2019-04-28 DIAGNOSIS — I739 Peripheral vascular disease, unspecified: Secondary | ICD-10-CM

## 2019-04-28 MED ORDER — ATORVASTATIN CALCIUM 40 MG PO TABS: 80.0000 mg | ORAL_TABLET | Freq: Every day | ORAL | 2 refills | Status: DC

## 2019-04-28 NOTE — Progress Notes (Signed)
Virtual Visit via Video Note   Subjective:   Jesus Lam, male    DOB: 01/15/1933, 85 y.o.   MRN: [id number]   I connected with the patient on 04/28/19 by a video enabled telemedicine application and verified that I am speaking with the correct person using two identifiers.     I discussed the limitations of evaluation and management by telemedicine and the availability of in person appointments. The patient expressed understanding and agreed to proceed.   This visit type was conducted due to national recommendations for restrictions regarding the COVID-19 Pandemic (e.g. social distancing).  This format is felt to be most appropriate for this patient at this time.  All issues noted in this document were discussed and addressed.  No physical exam was performed (except for noted visual exam findings with Tele health visits).  The patient has consented to conduct a Tele health visit and understands insurance will be billed.     Chief complaint:  PAD   HPI  85 year old African-American male with severe bilateral PAD with class III claudication, now s/p successful DCB Rt SFA and 2 overlapping Zilver PTX stents placed left SFA (12/2017), s/p left temporal occipital cortical infarct (05/2018).  Patient has been doing fairly well. He denies chest pain, shortness of breath, palpitations, leg edema, orthopnea, PND, TIA/syncope. He continues to have bilateral knee pain, but denies any calf or thigh pain on ambulation. He also denies presence of ulcers/wounds on his feet.   BP elevated today.    Past Medical History:  Diagnosis Date  . Anemia   . BPH (benign prostatic hyperplasia)   . Cataracts, bilateral   . CVA (cerebral vascular accident) (HCC)   . DVT (deep venous thrombosis) (HCC)    dvt in left leg  . Dyslipidemia   . Gout   . Gout   . HTN (hypertension)   . Left leg pain   . Osteoarthritis   . PAD (peripheral artery disease) (HCC)   . Stasis dermatitis   . Stroke (HCC)    . Vitamin D deficiency      Past Surgical History:  Procedure Laterality Date  . ABDOMINAL AORTOGRAM N/A 01/13/2018   Procedure: ABDOMINAL AORTOGRAM;  Surgeon: Elder NegusPatwardhan, Manish J, MD;  Location: [facility];  Service: Cardiovascular;  Laterality: N/A;  . CATARACT EXTRACTION, BILATERAL    . LEFT HEART CATH AND CORONARY ANGIOGRAPHY N/A 01/13/2018   Procedure: LEFT HEART CATH AND CORONARY ANGIOGRAPHY;  Surgeon: Elder NegusPatwardhan, Manish J, MD;  Location: [facility];  Service: Cardiovascular;  Laterality: N/A;  . LOWER EXTREMITY ANGIOGRAPHY N/A 01/13/2018   Procedure: LOWER EXTREMITY ANGIOGRAPHY;  Surgeon: Elder NegusPatwardhan, Manish J, MD;  Location: [facility];  Service: Cardiovascular;  Laterality: N/A;  . LOWER EXTREMITY ANGIOGRAPHY Right 01/27/2018   Procedure: LOWER EXTREMITY ANGIOGRAPHY;  Surgeon: Yates DecampGanji, Jay, MD;  Location: [facility];  Service: Cardiovascular;  Laterality: Right;  . PERIPHERAL VASCULAR ATHERECTOMY Left 01/13/2018   Procedure: PERIPHERAL VASCULAR ATHERECTOMY;  Surgeon: Elder NegusPatwardhan, Manish J, MD;  Location: [facility];  Service: Cardiovascular;  Laterality: Left;  SFA WITH PTA DRUG COATED BALLOON  . PERIPHERAL VASCULAR INTERVENTION  01/27/2018   Procedure: PERIPHERAL VASCULAR INTERVENTION;  Surgeon: Yates DecampGanji, Jay, MD;  Location: [facility];  Service: Cardiovascular;;     Social History   Socioeconomic History  . Marital status: Single    Spouse name: Not on file  . Number of children: 3  . Years of education:  Not on file  . Highest education level: Not on file  Occupational History  . Not on file  Social Needs  . Financial resource strain: Somewhat hard  . Food insecurity:    Worry: Sometimes true    Inability: Never true  . Transportation needs:    Medical: No    Non-medical: No  Tobacco Use  . Smoking status: Never Smoker  . Smokeless tobacco: Never Used  Substance and Sexual Activity  . Alcohol use: Yes    Alcohol/week: 1.0  standard drinks    Types: 1 Shots of liquor per week    Frequency: Never    Comment: occasionally  . Drug use: Yes    Frequency: 7.0 times per week    Types: Marijuana    Comment: daily ( 02/09/19)  . Sexual activity: Not on file  Lifestyle  . Physical activity:    Days per week: Not on file    Minutes per session: Not on file  . Stress: Not on file  Relationships  . Social connections:    Talks on phone: Not on file    Gets together: Not on file    Attends religious service: Not on file    Active member of club or organization: Not on file    Attends meetings of clubs or organizations: Not on file    Relationship status: Not on file  . Intimate partner violence:    Fear of current or ex partner: Not on file    Emotionally abused: Not on file    Physically abused: Not on file    Forced sexual activity: Not on file  Other Topics Concern  . Not on file  Social History Narrative   Lives with daughter, Rene KocherRegina. 2 daughters live here in [place]. Son lives in [place].   3/16- offered information on local food pantries as well as congregate meal sites. Information declined.     Family History  Problem Relation Age of Onset  . Cancer Mother   . Cancer Father   . Alcohol abuse Father   . Breast cancer Daughter   . Stroke Daughter   . CVA Daughter      Current Outpatient Medications on File Prior to Visit  Medication Sig Dispense Refill  . amLODipine (NORVASC) 5 MG tablet Take 5 mg by mouth daily.    Marland Kitchen. atorvastatin (LIPITOR) 80 MG tablet Take 1 tablet (80 mg total) by mouth daily. 90 tablet 0  . bisoprolol (ZEBETA) 5 MG tablet Take 0.5 tablets (2.5 mg total) by mouth daily. 60 tablet 2  . chlorthalidone (HYGROTON) 25 MG tablet Take 0.5 mg by mouth daily.     . Cholecalciferol (VITAMIN D3) 5000 units CAPS Take 5,000 Units by mouth daily.    [name]. Elastic Bandages & Supports (MEDICAL COMPRESSION STOCKINGS) MISC     . furosemide (LASIX) 20 MG tablet Take 20 mg by mouth  daily.  0  . gabapentin (NEURONTIN) 300 MG capsule Take 1 capsule (300 mg total) by mouth at bedtime. 90 capsule 0  . isosorbide-hydrALAZINE (BIDIL) 20-37.5 MG tablet Take by mouth.     Marland Kitchen. ketorolac (ACULAR) 0.5 % ophthalmic solution ketorolac 0.5 % eye drops    . Menthol-Methyl Salicylate (MUSCLE RUB) 10-15 % CREA muscle rub    . methimazole (TAPAZOLE) 10 MG tablet Take 1 tablet (10 mg total) by mouth daily. 30 tablet 6  . Misc. Devices (RAISED TOILET SEAT/LOCK & ARMS) MISC raised toilet seat    . Misc. Devices (  RUBBER BATH MAT) MISC bath mat    . Multiple Vitamins-Minerals (COMPLETE SENIOR PO) 1 tablet daily.     . Omega-3 Fatty Acids (FISH OIL PO) Take 1,000 mg by mouth.     . tamsulosin (FLOMAX) 0.4 MG CAPS capsule TAKE ONE CAPSULE BY MOUTH DAILY . TAKE HALF HOUR FOLLOWING THE SAME MEAL DAILY 30 capsule 0  . tobramycin (TOBREX) 0.3 % ophthalmic solution tobramycin 0.3 % eye drops    . vitamin C (ASCORBIC ACID) 500 MG tablet Take 500 mg daily by mouth.     No current facility-administered medications on file prior to visit.     Cardiovascular studies:  ABI 02/05/2018: This exam reveals moderately decreased perfusion of the lower extremity, noted at  the post tibial artery level (ABI). Right ABI 0.79 and left ABI 0.71. Compared to  11/19/2017:   LABI 0.67 and RABI 0.59.  Periperal angiography 01/27/2018:  Right mid and distal SFA CTO, moderate calcification evident. Successful PTA and stenting of the CTO right mid and distal SFA with antegrade left femoral and retrograde right PT access and implantation of 2 overlapping 6.0 x 1 20 mm Zilver PTX drug-coated stents. Stenosis reduced from 100% to 0%. Brisk flow was evident at the end of the procedure..  Echocardiogram 07/24/2018: 1. Left ventricle cavity is normal in size. Mild concentric hypertrophy of the left ventricle. Doppler evidence of grade I (impaired) diastolic dysfunction, normal LAP. Left ventricle regional wall motion findings:  Basal and mid inferior hypokinesis. Calculated EF 46%. 2. Moderate aortic valve leaflet thickening. Mildly restricted aortic valve leaflets. No evidence of aortic valve stenosis. Mild (Grade I) aortic regurgitation. 3. Structurally normal mitral valve with mild (Grade I) regurgitation. 4. Compared to the study done on 09/20/2017, no significant change in LVEF. Wall motion appears to be new. Previously mitral regurgitation was mild to moderate.   Recent labs: Results for NATHAINEL, MORONE (MRN [id number]) as of 04/28/2019 14:43  Ref. Range 11/17/2018 14:46 01/28/2019 16:06  Sodium Latest Ref Range: 134 - 144 mmol/L  144  Potassium Latest Ref Range: 3.5 - 5.2 mmol/L  5.1  Chloride Latest Ref Range: 96 - 106 mmol/L  105  CO2 Latest Ref Range: 20 - 29 mmol/L  25  Glucose Latest Ref Range: 65 - 99 mg/dL  950 (H)  BUN Latest Ref Range: 8 - 27 mg/dL  19  Creatinine Latest Ref Range: 0.76 - 1.27 mg/dL  9.32 (H)  Calcium Latest Ref Range: 8.6 - 10.2 mg/dL  67.1  Anion gap Latest Ref Range: 10.0 - 18.0 mmol/L  14.0  BUN/Creatinine Ratio Latest Ref Range: 10 - 24   13  Alkaline Phosphatase Latest Ref Range: 39 - 117 IU/L  80  Albumin Latest Ref Range: 3.6 - 4.6 g/dL  4.1  Albumin/Globulin Ratio Latest Ref Range: 1.2 - 2.2   1.2  AST Latest Ref Range: 0 - 40 IU/L  17  ALT Latest Ref Range: 0 - 44 IU/L  16  Total Protein Latest Ref Range: 6.0 - 8.5 g/dL  7.4  Total Bilirubin Latest Ref Range: 0.0 - 1.2 mg/dL  0.6  GFR, Est Non African American Latest Ref Range: >59 mL/min/1.73  42 (L)  GFR, Est African American Latest Ref Range: >59 mL/min/1.73  48 (L)  Iron Latest Ref Range: 42 - 163 ug/dL 56   UIBC Latest Ref Range: 117 - 376 ug/dL 245   TIBC Latest Ref Range: 202 - 409 ug/dL 809   Saturation Ratios Latest Ref Range:  20 - 55 % 21   Ferritin Latest Ref Range: 24 - 336 ng/mL 332   Globulin, Total Latest Ref Range: 1.5 - 4.5 g/dL  3.3   Results for Jesus Lam, Jesus Lam (MRN [id number]) as of 04/28/2019 14:43   Ref. Range 01/28/2019 16:06  WBC Latest Ref Range: 3.4 - 10.8 x10E3/uL 6.2  RBC Latest Ref Range: 4.14 - 5.80 x10E6/uL 3.96 (L)  Hemoglobin Latest Ref Range: 13.0 - 17.7 g/dL 40.911.7 (L)  HCT Latest Ref Range: 37.5 - 51.0 % 36.4 (L)  MCV Latest Ref Range: 79 - 97 fL 92  MCH Latest Ref Range: 26.6 - 33.0 pg 29.5  MCHC Latest Ref Range: 31.5 - 35.7 g/dL 81.132.1  RDW Latest Ref Range: 11.6 - 15.4 % 14.2  Platelets Latest Ref Range: 150 - 450 x10E3/uL 194     Review of Systems  Constitution: Negative for decreased appetite, malaise/fatigue, weight gain and weight loss.  HENT: Negative for congestion.   Eyes: Negative for visual disturbance.  Cardiovascular: Negative for chest pain, dyspnea on exertion, leg swelling, palpitations and syncope.  Respiratory: Negative for cough.   Endocrine: Negative for cold intolerance.  Hematologic/Lymphatic: Does not bruise/bleed easily.  Skin: Negative for itching and rash.  Musculoskeletal: Positive for joint pain (Both knees). Negative for myalgias.  Gastrointestinal: Negative for abdominal pain, nausea and vomiting.  Genitourinary: Negative for dysuria.  Neurological: Negative for dizziness and weakness.  Psychiatric/Behavioral: The patient is not nervous/anxious.   All other systems reviewed and are negative.        Vitals:   04/28/19 1323  BP: (!) 160/82  Pulse: 77   (Measured by the patient using a home BP monitor)  Body mass index is 33.91 kg/m. Filed Weights   04/28/19 1323  Weight: 250 lb (113.4 kg)     Observation/findings during video visit   Objective:    Physical Exam  Constitutional: He is oriented to person, place, and time. He appears well-developed and well-nourished. No distress.  Pulmonary/Chest: Effort normal.  Neurological: He is alert and oriented to person, place, and time.  Psychiatric: He has a normal mood and affect.  Nursing note and vitals reviewed.         Assessment & Recommendations:    85 year old African-American male with severe bilateral PAD with class III claudication, now s/p successful DCB Rt SFA and 2 overlapping Zilver PTX stents placed left SFA (12/2017), now s/p left temporal occipital cortical infarct.  Hypertension: BP elevated today. Usually lower than this. Lot of discrepancies in his medication. I have not made any changes today. Continue monitoring BO at home. I will see him for an in office visit in 3 months.   Hypertriglyceridemia: Seen on last lipid panel in 10/2018. He was not taking lipitor. Refilled at 40 mg daily. Repeat lipid panel.   PAD: Stable. I think his leg pain is more likely due to osetoarthritis, than PAD. COnitue medical management for now.   Elder NegusManish J Patwardhan, MD [facility] Pager: [phone] Office: [phone] If no answer Cell [phone]

## 2019-04-29 ENCOUNTER — Encounter: Payer: Self-pay | Admitting: Cardiology

## 2019-04-29 ENCOUNTER — Other Ambulatory Visit: Payer: Self-pay | Admitting: Nurse Practitioner

## 2019-04-29 DIAGNOSIS — I639 Cerebral infarction, unspecified: Secondary | ICD-10-CM | POA: Diagnosis not present

## 2019-04-29 DIAGNOSIS — E782 Mixed hyperlipidemia: Secondary | ICD-10-CM | POA: Insufficient documentation

## 2019-04-29 DIAGNOSIS — E781 Pure hyperglyceridemia: Secondary | ICD-10-CM | POA: Insufficient documentation

## 2019-04-29 IMAGING — US US THYROID
1 series · 13 of 25 positions shown · non-contrast
Comparison: None.

CLINICAL DATA: Hyperthyroid. 86-year-old male with hyperthyroidism.

EXAM:
THYROID ULTRASOUND
TECHNIQUE: Ultrasound examination of the thyroid gland and adjacent soft
tissues was performed.

[Series 1: us thyroid · 0.07mm/px · 13 of 54 slices shown]
[im 1/54]
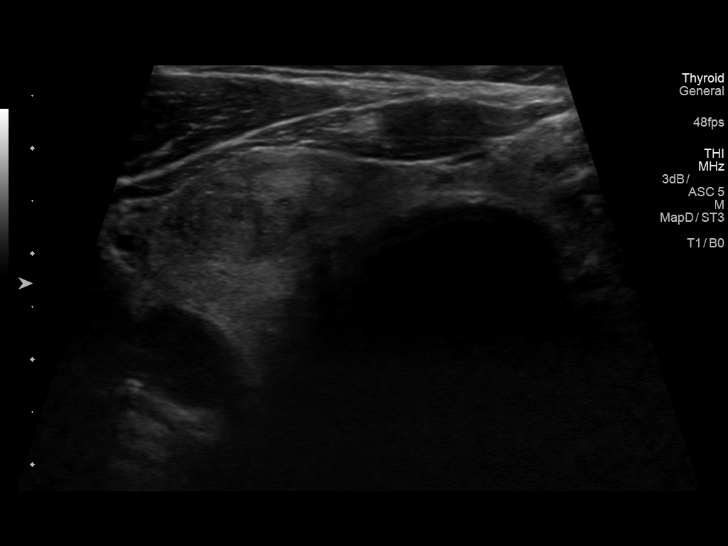
[im 5/54]
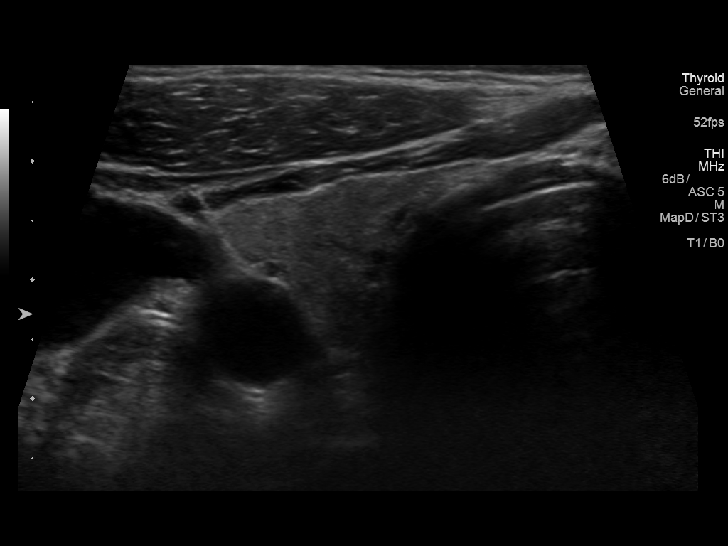
[im 9/54]
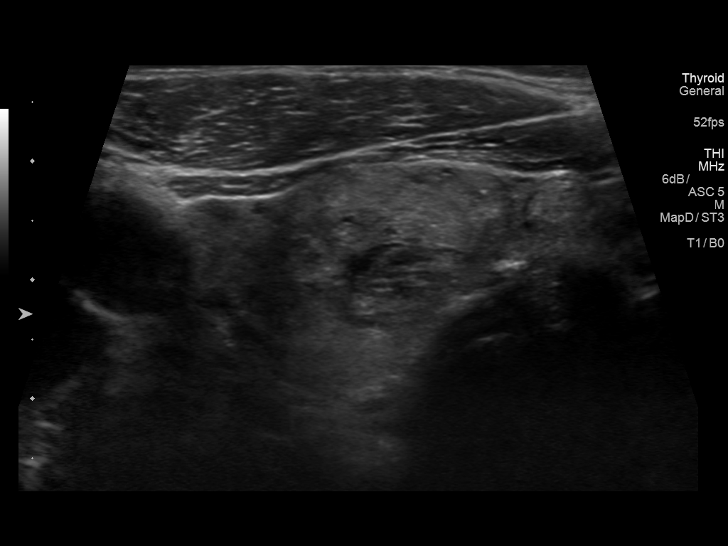
[im 14/54]
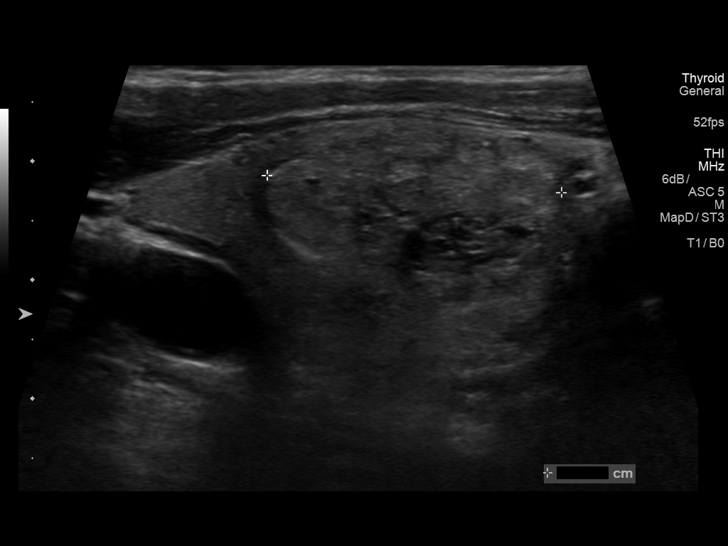
[im 18/54]
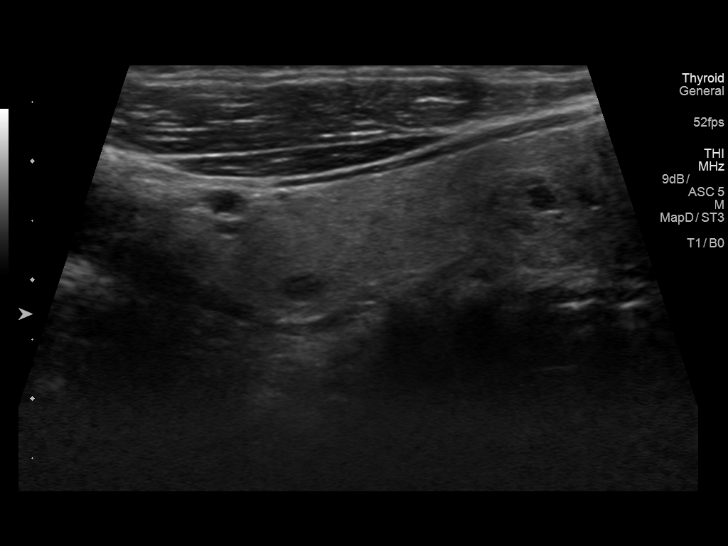
[im 23/54]
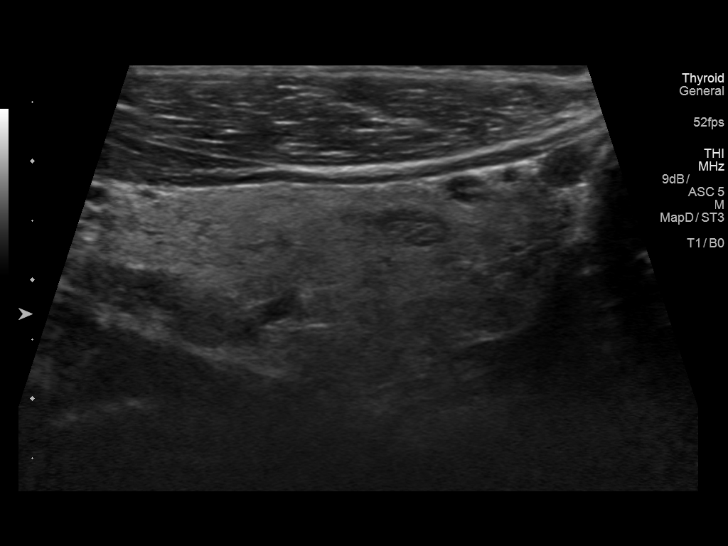
[im 27/54]
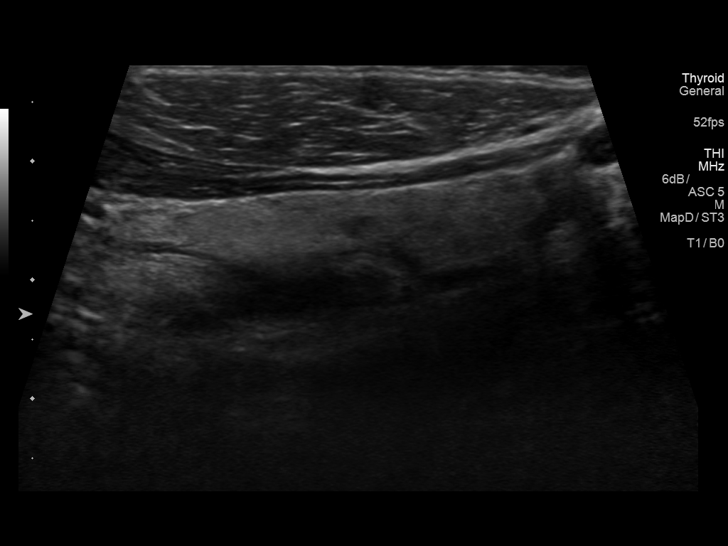
[im 31/54]
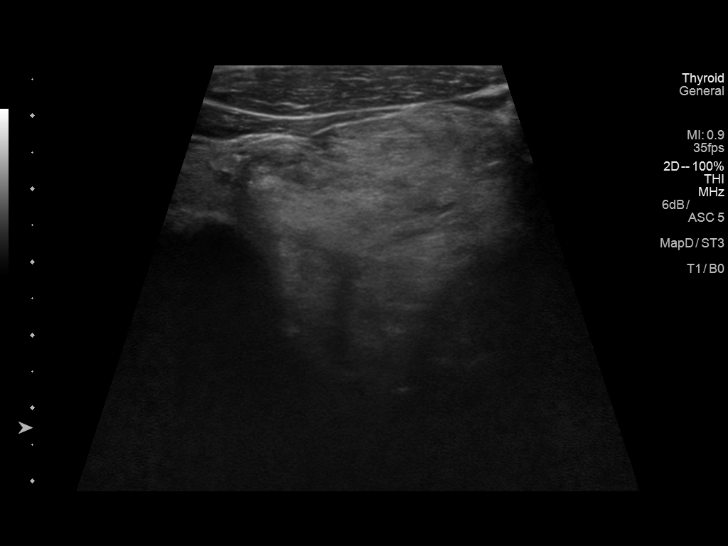
[im 36/54]
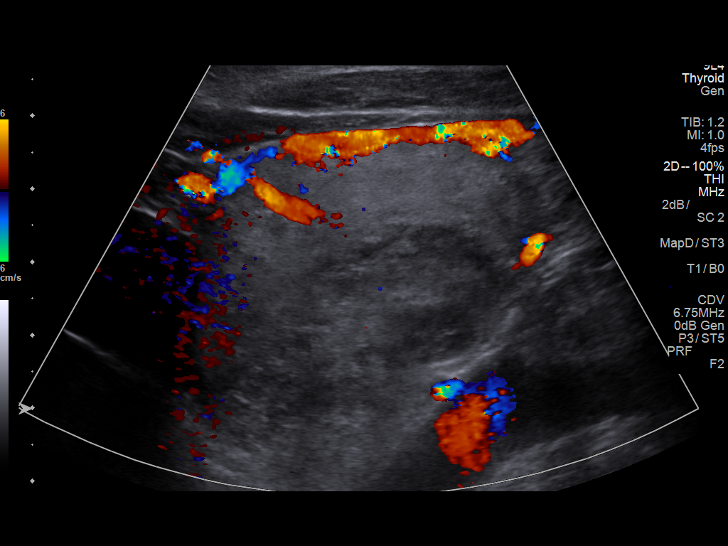
[im 40/54]
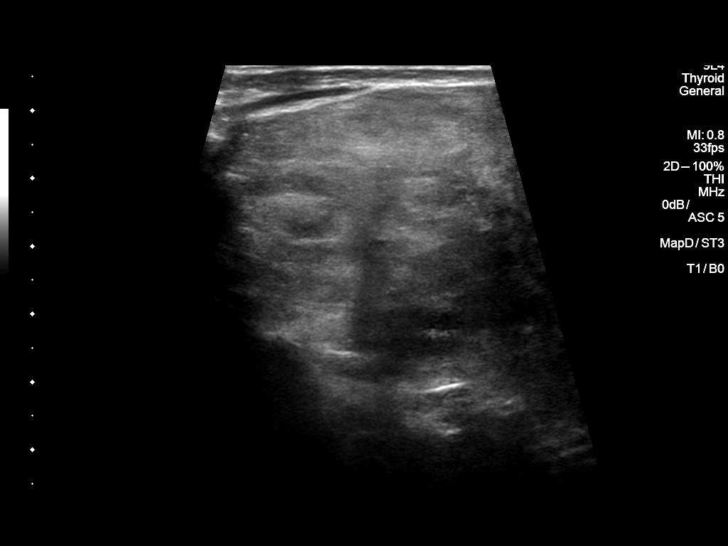
[im 45/54]
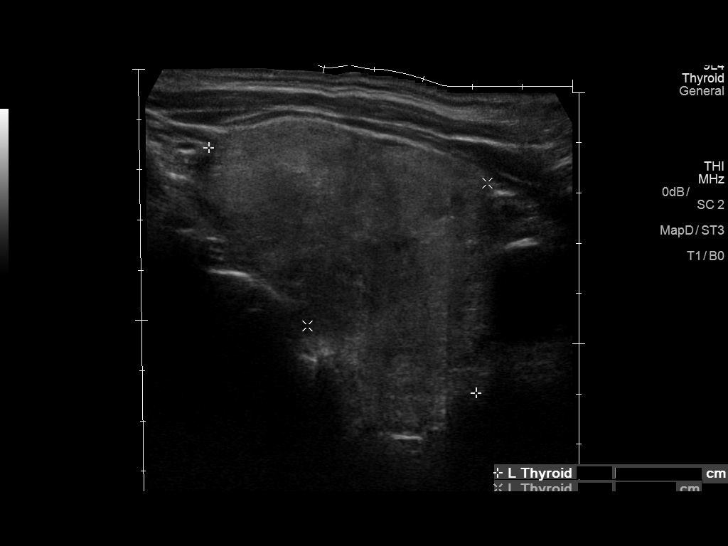
[im 49/54]
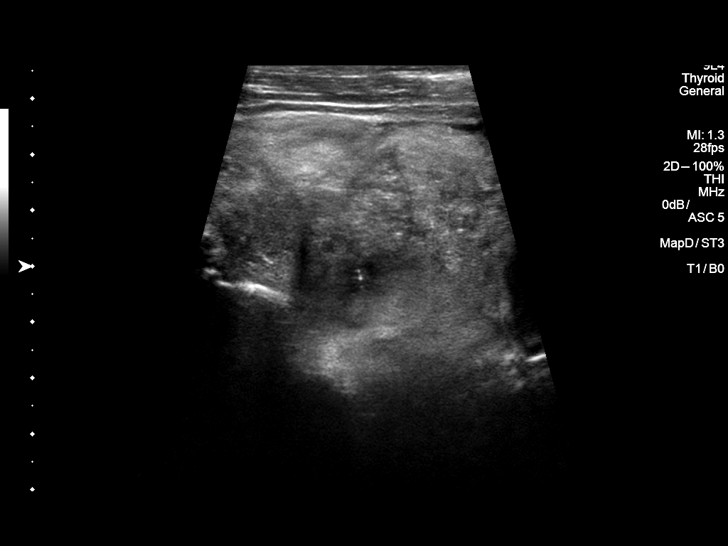
[im 54/54]
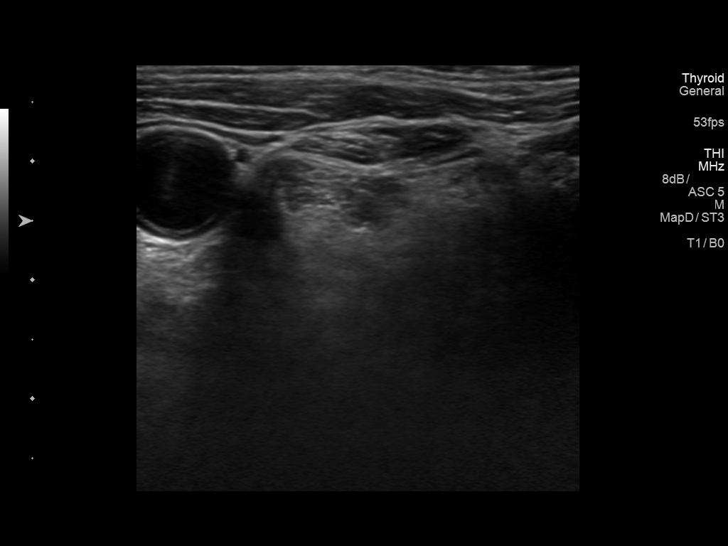

[13 of 25 positions shown; findings below may reference images not displayed]

FINDINGS: Parenchymal Echotexture: Mildly heterogenous

Isthmus: 0.8 cm

Right lobe: 4.5 x 1.7 x 2.4 cm

Left lobe: 7.2 x 4.5 x 4.8 cm

_________________________________________________________

Estimated total number of nodules >/= 1 cm: 2

Number of spongiform nodules >/=  2 cm not described below (TR1): 0

Number of mixed cystic and solid nodules >/= 1.5 cm not described
below (TR2): 0

_________________________________________________________

Nodule # 1:

Location: Right; Mid

Maximum size: 2.5 cm; Other 2 dimensions: 2.0 x 1.4 cm

Composition: solid/almost completely solid (2)

Echogenicity: isoechoic (1)

Shape: not taller-than-wide (0)

Margins: smooth (0)

Echogenic foci: none (0)

ACR TI-RADS total points: 3.

ACR TI-RADS risk category: TR3 (3 points).

ACR TI-RADS recommendations:

**Given size (>/= 2.5 cm) and appearance, fine needle aspiration of
this mildly suspicious nodule should be considered based on TI-RADS
criteria.

_________________________________________________________

Nodule # 2:

Location: Left; Mid

Maximum size: 7.2 cm; Other 2 dimensions: 4.5 x 4.8 cm

Composition: solid/almost completely solid (2)

Echogenicity: isoechoic (1)

Shape: not taller-than-wide (0)

Margins: smooth (0)

Echogenic foci: none (0)

ACR TI-RADS total points: 3.

ACR TI-RADS risk category: TR3 (3 points).

ACR TI-RADS recommendations:

**Given size (>/= 2.5 cm) and appearance, fine needle aspiration of
this mildly suspicious nodule should be considered based on TI-RADS
criteria.

_________________________________________________________
IMPRESSION: 1. Nodule # 1, a 2.5 cm TI-RADS category 3 nodule in the medial
right mid gland meets criteria for consideration of fine-needle
aspiration biopsy.
2. Nodule # 2, a large 7.2 cm TI-RADS category 3 nodule occupying
the majority of the left thyroid gland meets criteria for
consideration of fine-needle aspiration biopsy.

The above is in keeping with the ACR TI-RADS recommendations - [HOSPITAL] 5689;[DATE].

## 2019-04-30 ENCOUNTER — Other Ambulatory Visit: Payer: Self-pay | Admitting: Internal Medicine

## 2019-04-30 DIAGNOSIS — I639 Cerebral infarction, unspecified: Secondary | ICD-10-CM | POA: Diagnosis not present

## 2019-04-30 DIAGNOSIS — I10 Essential (primary) hypertension: Secondary | ICD-10-CM

## 2019-05-03 DIAGNOSIS — I639 Cerebral infarction, unspecified: Secondary | ICD-10-CM | POA: Diagnosis not present

## 2019-05-04 DIAGNOSIS — I639 Cerebral infarction, unspecified: Secondary | ICD-10-CM | POA: Diagnosis not present

## 2019-05-05 DIAGNOSIS — I639 Cerebral infarction, unspecified: Secondary | ICD-10-CM | POA: Diagnosis not present

## 2019-05-06 ENCOUNTER — Encounter: Payer: Self-pay | Admitting: Nurse Practitioner

## 2019-05-06 ENCOUNTER — Ambulatory Visit (INDEPENDENT_AMBULATORY_CARE_PROVIDER_SITE_OTHER): Payer: Medicare HMO | Admitting: Nurse Practitioner

## 2019-05-06 ENCOUNTER — Other Ambulatory Visit: Payer: Self-pay

## 2019-05-06 VITALS — BP 140/74 | HR 53 | Temp 98.4°F | Ht 71.0 in | Wt 238.8 lb

## 2019-05-06 DIAGNOSIS — I739 Peripheral vascular disease, unspecified: Secondary | ICD-10-CM

## 2019-05-06 DIAGNOSIS — E781 Pure hyperglyceridemia: Secondary | ICD-10-CM | POA: Diagnosis not present

## 2019-05-06 DIAGNOSIS — E782 Mixed hyperlipidemia: Secondary | ICD-10-CM | POA: Diagnosis not present

## 2019-05-06 DIAGNOSIS — I10 Essential (primary) hypertension: Secondary | ICD-10-CM

## 2019-05-06 DIAGNOSIS — I639 Cerebral infarction, unspecified: Secondary | ICD-10-CM | POA: Diagnosis not present

## 2019-05-06 NOTE — Progress Notes (Signed)
Subjective:     Patient ID: Jesus Lam , male    DOB: 25-Mar-1933 , 85 y.o.   MRN: [id number]   Chief Complaint  Patient presents with  . Hypertension    HPI  His biopsy of his thyroid was benign.  He does not have any complaints today.  Hypertension This is a chronic problem. The current episode started more than 1 year ago. The problem is unchanged. The problem is controlled. Pertinent negatives include no anxiety or headaches.     Past Medical History:  Diagnosis Date  . Anemia   . BPH (benign prostatic hyperplasia)   . Cataracts, bilateral   . CVA (cerebral vascular accident) ([place])   . DVT (deep venous thrombosis) (HCC)    dvt in left leg  . Dyslipidemia   . Gout   . Gout   . HTN (hypertension)   . Left leg pain   . Osteoarthritis   . PAD (peripheral artery disease) ([place])   . Stasis dermatitis   . Stroke ([place])   . Vitamin D deficiency      Family History  Problem Relation Age of Onset  . Cancer Mother   . Cancer Father   . Alcohol abuse Father   . Breast cancer Daughter   . Stroke Daughter   . CVA Daughter      Current Outpatient Medications:  .  amLODipine (NORVASC) 5 MG tablet, Take 5 mg by mouth daily., Disp: , Rfl:  .  atorvastatin (LIPITOR) 40 MG tablet, Take 2 tablets (80 mg total) by mouth daily., Disp: 90 tablet, Rfl: 2 .  bisoprolol (ZEBETA) 5 MG tablet, Take 0.5 tablets (2.5 mg total) by mouth daily., Disp: 60 tablet, Rfl: 2 .  chlorthalidone (HYGROTON) 25 MG tablet, Take 0.5 mg by mouth daily. , Disp: , Rfl:  .  Cholecalciferol (VITAMIN D3) 5000 units CAPS, Take 5,000 Units by mouth daily., Disp: , Rfl:  .  Elastic Bandages & Supports (MEDICAL COMPRESSION STOCKINGS) MISC, , Disp: , Rfl:  .  furosemide (LASIX) 20 MG tablet, Take 20 mg by mouth daily., Disp: , Rfl: 0 .  gabapentin (NEURONTIN) 300 MG capsule, TAKE ONE CAPSULE BY MOUTH AT BEDTIME, Disp: 90 capsule, Rfl: 0 .  isosorbide-hydrALAZINE (BIDIL) 20-37.5 MG tablet, Take by mouth. ,  Disp: , Rfl:  .  ketorolac (ACULAR) 0.5 % ophthalmic solution, ketorolac 0.5 % eye drops, Disp: , Rfl:  .  Menthol-Methyl Salicylate (MUSCLE RUB) 10-15 % CREA, muscle rub, Disp: , Rfl:  .  methimazole (TAPAZOLE) 10 MG tablet, Take 1 tablet (10 mg total) by mouth daily., Disp: 30 tablet, Rfl: 6 .  Multiple Vitamins-Minerals (COMPLETE SENIOR PO), 1 tablet daily. , Disp: , Rfl:  .  Omega-3 Fatty Acids (FISH OIL PO), Take 1,000 mg by mouth. , Disp: , Rfl:  .  tamsulosin (FLOMAX) 0.4 MG CAPS capsule, TAKE ONE CAPSULE BY MOUTH DAILY . TAKE HALF HOUR FOLLOWING THE SAME MEAL DAILY, Disp: 30 capsule, Rfl: 0 .  vitamin C (ASCORBIC ACID) 500 MG tablet, Take 500 mg daily by mouth., Disp: , Rfl:    Allergies  Allergen Reactions  . Shellfish Allergy Other (See Comments)    Gout      Review of Systems  Constitutional: Negative.   Respiratory: Negative.   Cardiovascular: Negative.   Musculoskeletal: Negative.        Uses cane  Skin: Negative.   Neurological: Negative for dizziness and headaches.     Today's Vitals   05/06/19  1413  BP: 140/74  Pulse: (!) 53  Temp: 98.4 F (36.9 C)  TempSrc: Oral  Weight: 238 lb 12.8 oz (108.3 kg)  Height: 5\' 11"  (1.803 m)  PainSc: 0-No pain  PainLoc: Knee   Body mass index is 33.31 kg/m.   Objective:  Physical Exam Vitals signs reviewed.  Constitutional:      Appearance: Normal appearance. He is obese.  Cardiovascular:     Rate and Rhythm: Normal rate and regular rhythm.     Pulses: Normal pulses.     Heart sounds: Normal heart sounds. No murmur.  Pulmonary:     Effort: Pulmonary effort is normal.     Breath sounds: Normal breath sounds.  Skin:    General: Skin is warm and dry.     Capillary Refill: Capillary refill takes less than 2 seconds.  Neurological:     General: No focal deficit present.     Mental Status: He is alert and oriented to person, place, and time.         Assessment And Plan:     1. Essential hypertension . B/P is in  fair control.  . CMP ordered to check renal function.  . The importance of regular exercise and dietary modification was stressed to the patient.  . Stressed importance of losing ten percent of her body weight to help with B/P control. .  - CMP14 + Anion Gap; Future - CMP14 + Anion Gap  2. Peripheral artery disease (HCC)  Chronic, stable  No current issues  3. Hypertriglyceridemia  Chronic, controlled  Continue with current medications  4. Mixed hyperlipidemia  Chronic, controlled  Continue with current medications - Lipid panel   , FNP    THE PATIENT IS ENCOURAGED TO PRACTICE SOCIAL DISTANCING DUE TO THE COVID-19 PANDEMIC.

## 2019-05-07 LAB — LIPID PANEL
Chol/HDL Ratio: 4.2 ratio (ref 0.0–5.0)
Cholesterol, Total: 127 mg/dL (ref 100–199)
HDL: 30 mg/dL — ABNORMAL LOW (ref >39)
LDL Calculated: 55 mg/dL (ref 0–99)
Triglycerides: 209 mg/dL — ABNORMAL HIGH (ref 0–149)
VLDL Cholesterol Cal: 42 mg/dL — ABNORMAL HIGH (ref 5–40)

## 2019-05-07 LAB — CMP14 + ANION GAP
ALT: 18 IU/L (ref 0–44)
AST: 24 IU/L (ref 0–40)
Albumin/Globulin Ratio: 1.1 — ABNORMAL LOW (ref 1.2–2.2)
Albumin: 4.1 g/dL (ref 3.6–4.6)
Alkaline Phosphatase: 89 IU/L (ref 39–117)
Anion Gap: 12 mmol/L (ref 10.0–18.0)
BUN/Creatinine Ratio: 14 (ref 10–24)
BUN: 21 mg/dL (ref 8–27)
Bilirubin Total: 0.5 mg/dL (ref 0.0–1.2)
CO2: 24 mmol/L (ref 20–29)
Calcium: 9.8 mg/dL (ref 8.6–10.2)
Chloride: 107 mmol/L — ABNORMAL HIGH (ref 96–106)
Creatinine, Ser: 1.49 mg/dL — ABNORMAL HIGH (ref 0.76–1.27)
GFR calc Af Amer: 49 mL/min/{1.73_m2} — ABNORMAL LOW (ref >59)
GFR calc non Af Amer: 42 mL/min/{1.73_m2} — ABNORMAL LOW (ref >59)
Globulin, Total: 3.8 g/dL (ref 1.5–4.5)
Glucose: 99 mg/dL (ref 65–99)
Potassium: 5 mmol/L (ref 3.5–5.2)
Sodium: 143 mmol/L (ref 134–144)
Total Protein: 7.9 g/dL (ref 6.0–8.5)

## 2019-05-21 DIAGNOSIS — I639 Cerebral infarction, unspecified: Secondary | ICD-10-CM | POA: Diagnosis not present

## 2019-05-24 DIAGNOSIS — I639 Cerebral infarction, unspecified: Secondary | ICD-10-CM | POA: Diagnosis not present

## 2019-05-25 DIAGNOSIS — I639 Cerebral infarction, unspecified: Secondary | ICD-10-CM | POA: Diagnosis not present

## 2019-05-26 DIAGNOSIS — I639 Cerebral infarction, unspecified: Secondary | ICD-10-CM | POA: Diagnosis not present

## 2019-05-27 DIAGNOSIS — I639 Cerebral infarction, unspecified: Secondary | ICD-10-CM | POA: Diagnosis not present

## 2019-05-28 DIAGNOSIS — I639 Cerebral infarction, unspecified: Secondary | ICD-10-CM | POA: Diagnosis not present

## 2019-05-31 DIAGNOSIS — I639 Cerebral infarction, unspecified: Secondary | ICD-10-CM | POA: Diagnosis not present

## 2019-06-01 ENCOUNTER — Other Ambulatory Visit: Payer: Self-pay | Admitting: Nurse Practitioner

## 2019-06-01 DIAGNOSIS — I639 Cerebral infarction, unspecified: Secondary | ICD-10-CM | POA: Diagnosis not present

## 2019-06-02 DIAGNOSIS — I639 Cerebral infarction, unspecified: Secondary | ICD-10-CM | POA: Diagnosis not present

## 2019-06-03 DIAGNOSIS — I639 Cerebral infarction, unspecified: Secondary | ICD-10-CM | POA: Diagnosis not present

## 2019-06-18 ENCOUNTER — Telehealth: Payer: Self-pay

## 2019-06-18 NOTE — Telephone Encounter (Signed)
I called Darrell Jewel at [phone] regarding pt forms to see what needs to be corrected I have left her a v/m to give me a call back. YRL,RMA

## 2019-06-24 ENCOUNTER — Telehealth: Payer: Self-pay

## 2019-06-24 NOTE — Telephone Encounter (Signed)
I have called Almedia Balls from the [facility] to see what needs to be corrected on the patient's forms. Left her a v/m to give me a call back. YRL,RMA

## 2019-07-05 ENCOUNTER — Ambulatory Visit: Payer: Medicare HMO | Admitting: Cardiology

## 2019-07-08 ENCOUNTER — Other Ambulatory Visit: Payer: Self-pay

## 2019-07-08 MED ORDER — FUROSEMIDE 20 MG PO TABS: 20.0000 mg | ORAL_TABLET | Freq: Every day | ORAL | 0 refills | Status: DC

## 2019-07-28 ENCOUNTER — Other Ambulatory Visit: Payer: Self-pay

## 2019-07-28 ENCOUNTER — Encounter: Payer: Self-pay | Admitting: Cardiology

## 2019-07-28 ENCOUNTER — Ambulatory Visit (INDEPENDENT_AMBULATORY_CARE_PROVIDER_SITE_OTHER): Payer: Medicare HMO | Admitting: Cardiology

## 2019-07-28 VITALS — BP 143/67 | HR 65 | Ht 72.0 in | Wt 240.2 lb

## 2019-07-28 DIAGNOSIS — I10 Essential (primary) hypertension: Secondary | ICD-10-CM | POA: Diagnosis not present

## 2019-07-28 DIAGNOSIS — I440 Atrioventricular block, first degree: Secondary | ICD-10-CM | POA: Diagnosis not present

## 2019-07-28 DIAGNOSIS — I739 Peripheral vascular disease, unspecified: Secondary | ICD-10-CM | POA: Diagnosis not present

## 2019-07-28 DIAGNOSIS — E781 Pure hyperglyceridemia: Secondary | ICD-10-CM | POA: Diagnosis not present

## 2019-07-28 MED ORDER — CLOPIDOGREL BISULFATE 75 MG PO TABS: 75.0000 mg | ORAL_TABLET | Freq: Every day | ORAL | 3 refills | Status: DC

## 2019-07-28 NOTE — Progress Notes (Signed)
Virtual Visit via Video Note   Subjective:   Jesus Lam, male    DOB: November 21, 1933, 85 y.o.   MRN: [id number]   Chief complaint:  PAD   HPI  85 year old African-American male with severe bilateral PAD with class III claudication, now s/p successful DCB Rt SFA and 2 overlapping Zilver PTX stents placed left SFA (12/2017), s/p left temporal occipital cortical infarct (05/2018).  Patient has been doing fairly well. He denies chest pain, shortness of breath, palpitations, leg edema, orthopnea, PND, TIA/syncope. He continues to have bilateral knee pain, but denies any calf or thigh pain on ambulation. He also denies presence of ulcers/wounds on his feet.    Past Medical History:  Diagnosis Date  . Anemia   . BPH (benign prostatic hyperplasia)   . Cataracts, bilateral   . CVA (cerebral vascular accident) (HCC)   . DVT (deep venous thrombosis) (HCC)    dvt in left leg  . Dyslipidemia   . Gout   . Gout   . HTN (hypertension)   . Left leg pain   . Osteoarthritis   . PAD (peripheral artery disease) (HCC)   . Stasis dermatitis   . Stroke (HCC)   . Vitamin D deficiency      Past Surgical History:  Procedure Laterality Date  . ABDOMINAL AORTOGRAM N/A 01/13/2018   Procedure: ABDOMINAL AORTOGRAM;  Surgeon: Elder Negus, MD;  Location: [facility];  Service: Cardiovascular;  Laterality: N/A;  . CATARACT EXTRACTION, BILATERAL    . LEFT HEART CATH AND CORONARY ANGIOGRAPHY N/A 01/13/2018   Procedure: LEFT HEART CATH AND CORONARY ANGIOGRAPHY;  Surgeon: Elder Negus, MD;  Location: [facility];  Service: Cardiovascular;  Laterality: N/A;  . LOWER EXTREMITY ANGIOGRAPHY N/A 01/13/2018   Procedure: LOWER EXTREMITY ANGIOGRAPHY;  Surgeon: Elder Negus, MD;  Location: [facility];  Service: Cardiovascular;  Laterality: N/A;  . LOWER EXTREMITY ANGIOGRAPHY Right 01/27/2018   Procedure: LOWER EXTREMITY ANGIOGRAPHY;  Surgeon: Yates Decamp, MD;  Location: [facility];  Service: Cardiovascular;  Laterality: Right;  . PERIPHERAL VASCULAR ATHERECTOMY Left 01/13/2018   Procedure: PERIPHERAL VASCULAR ATHERECTOMY;  Surgeon: Elder Negus, MD;  Location: [facility];  Service: Cardiovascular;  Laterality: Left;  SFA WITH PTA DRUG COATED BALLOON  . PERIPHERAL VASCULAR INTERVENTION  01/27/2018   Procedure: PERIPHERAL VASCULAR INTERVENTION;  Surgeon: Yates Decamp, MD;  Location: [facility];  Service: Cardiovascular;;     Social History   Socioeconomic History  . Marital status: Single    Spouse name: Not on file  . Number of children: 3  . Years of education: Not on file  . Highest education level: Not on file  Occupational History  . Not on file  Social Needs  . Financial resource strain: Somewhat hard  . Food insecurity    Worry: Sometimes true    Inability: Never true  . Transportation needs    Medical: No    Non-medical: No  Tobacco Use  . Smoking status: Never Smoker  . Smokeless tobacco: Never Used  Substance and Sexual Activity  . Alcohol use: Yes    Alcohol/week: 1.0 standard drinks    Types: 1 Shots of liquor per week    Frequency: Never    Comment: occasionally  . Drug use: Yes    Frequency: 7.0 times per week    Types: Marijuana    Comment: daily ( 02/09/19)  . Sexual activity: Not on file  Lifestyle  . Physical activity    Days per week: Not on file    Minutes per session: Not on file  . Stress: Not on file  Relationships  . Social Musician on phone: Not on file    Gets together: Not on file    Attends religious service: Not on file    Active member of club or organization: Not on file    Attends meetings of clubs or organizations: Not on file    Relationship status: Not on file  . Intimate partner violence    Fear of current or ex partner: Not on file    Emotionally abused: Not on file    Physically abused: Not on file    Forced sexual activity: Not on file  Other Topics  Concern  . Not on file  Social History Narrative   Lives with daughter, [name]. 2 daughters live here in [place]. Son lives in [place].   3/16- offered information on local food pantries as well as congregate meal sites. Information declined.     Family History  Problem Relation Age of Onset  . Cancer Mother   . Cancer Father   . Alcohol abuse Father   . Breast cancer Daughter   . Stroke Daughter   . CVA Daughter      Current Outpatient Medications on File Prior to Visit  Medication Sig Dispense Refill  . amLODipine (NORVASC) 5 MG tablet Take 5 mg by mouth daily.    Marland Kitchen atorvastatin (LIPITOR) 40 MG tablet Take 2 tablets (80 mg total) by mouth daily. 90 tablet 2  . bisoprolol (ZEBETA) 5 MG tablet Take 0.5 tablets (2.5 mg total) by mouth daily. 60 tablet 2  . chlorthalidone (HYGROTON) 25 MG tablet Take 0.5 mg by mouth daily.     . Cholecalciferol (VITAMIN D3) 5000 units CAPS Take 5,000 Units by mouth daily.    [name] (MEDICAL COMPRESSION STOCKINGS) MISC     . furosemide (LASIX) 20 MG tablet Take 1 tablet (20 mg total) by mouth daily. 90 tablet 0  . gabapentin (NEURONTIN) 300 MG capsule TAKE ONE CAPSULE BY MOUTH AT BEDTIME 90 capsule 0  . isosorbide-hydrALAZINE (BIDIL) 20-37.5 MG tablet Take by mouth.     Marland Kitchen ketorolac (ACULAR) 0.5 % ophthalmic solution ketorolac 0.5 % eye drops    . Menthol-Methyl Salicylate (MUSCLE RUB) 10-15 % CREA muscle rub    . methimazole (TAPAZOLE) 10 MG tablet Take 1 tablet (10 mg total) by mouth daily. 30 tablet 6  . Multiple Vitamins-Minerals (COMPLETE SENIOR PO) 1 tablet daily.     . Omega-3 Fatty Acids (FISH OIL PO) Take 1,000 mg by mouth.     . tamsulosin (FLOMAX) 0.4 MG CAPS capsule TAKE ONE CAPSULE BY MOUTH DAILY . TAKE HALF HOUR FOLLOWING THE SAME MEAL DAILY 30 capsule 0  . vitamin C (ASCORBIC ACID) 500 MG tablet Take 500 mg daily by mouth.     No current facility-administered medications on file prior to visit.      Cardiovascular studies:  EKG 07/28/2019: Sinus rhythm 61 bpm IVCD. Left axis deviation. Cannot exclude old anterior infarct.  No change compared to prvious EKG.  ABI 02/05/2018: This exam reveals moderately decreased perfusion of the lower extremity, noted at  the post tibial artery level (ABI). Right ABI 0.79 and left ABI 0.71. Compared to  11/19/2017:   LABI 0.67 and RABI 0.59.  Periperal angiography 01/27/2018:  Right mid and distal SFA  CTO, moderate calcification evident. Successful PTA and stenting of the CTO right mid and distal SFA with antegrade left femoral and retrograde right PT access and implantation of 2 overlapping 6.0 x 1 20 mm Zilver PTX drug-coated stents. Stenosis reduced from 100% to 0%. Brisk flow was evident at the end of the procedure..  Echocardiogram 07/24/2018: 1. Left ventricle cavity is normal in size. Mild concentric hypertrophy of the left ventricle. Doppler evidence of grade I (impaired) diastolic dysfunction, normal LAP. Left ventricle regional wall motion findings: Basal and mid inferior hypokinesis. Calculated EF 46%. 2. Moderate aortic valve leaflet thickening. Mildly restricted aortic valve leaflets. No evidence of aortic valve stenosis. Mild (Grade I) aortic regurgitation. 3. Structurally normal mitral valve with mild (Grade I) regurgitation. 4. Compared to the study done on 09/20/2017, no significant change in LVEF. Wall motion appears to be new. Previously mitral regurgitation was mild to moderate.   Recent labs:  Results for ARTEMIS, LOYAL (MRN [id number]) as of 07/28/2019 08:37  Ref. Range 05/06/2019 14:36  Total CHOL/HDL Ratio Latest Ref Range: 0.0 - 5.0 ratio 4.2  Cholesterol, Total Latest Ref Range: 100 - 199 mg/dL 127  HDL Cholesterol Latest Ref Range: >39 mg/dL 30 (L)  LDL (calc) Latest Ref Range: 0 - 99 mg/dL 55  Triglycerides Latest Ref Range: 0 - 149 mg/dL 209 (H)  VLDL Cholesterol Cal Latest Ref Range: 5 - 40 mg/dL 42 (H)    Results  for ROWYN, SPILDE (MRN [id number]) as of 04/28/2019 14:43  Ref. Range 11/17/2018 14:46 01/28/2019 16:06  Sodium Latest Ref Range: 134 - 144 mmol/L  144  Potassium Latest Ref Range: 3.5 - 5.2 mmol/L  5.1  Chloride Latest Ref Range: 96 - 106 mmol/L  105  CO2 Latest Ref Range: 20 - 29 mmol/L  25  Glucose Latest Ref Range: 65 - 99 mg/dL  100 (H)  BUN Latest Ref Range: 8 - 27 mg/dL  19  Creatinine Latest Ref Range: 0.76 - 1.27 mg/dL  1.51 (H)  Calcium Latest Ref Range: 8.6 - 10.2 mg/dL  10.2  Anion gap Latest Ref Range: 10.0 - 18.0 mmol/L  14.0  BUN/Creatinine Ratio Latest Ref Range: 10 - 24   13  Alkaline Phosphatase Latest Ref Range: 39 - 117 IU/L  80  Albumin Latest Ref Range: 3.6 - 4.6 g/dL  4.1  Albumin/Globulin Ratio Latest Ref Range: 1.2 - 2.2   1.2  AST Latest Ref Range: 0 - 40 IU/L  17  ALT Latest Ref Range: 0 - 44 IU/L  16  Total Protein Latest Ref Range: 6.0 - 8.5 g/dL  7.4  Total Bilirubin Latest Ref Range: 0.0 - 1.2 mg/dL  0.6  GFR, Est Non African American Latest Ref Range: >59 mL/min/1.73  42 (L)  GFR, Est African American Latest Ref Range: >59 mL/min/1.73  48 (L)  Iron Latest Ref Range: 42 - 163 ug/dL 56   UIBC Latest Ref Range: 117 - 376 ug/dL 205   TIBC Latest Ref Range: 202 - 409 ug/dL 260   Saturation Ratios Latest Ref Range: 20 - 55 % 21   Ferritin Latest Ref Range: 24 - 336 ng/mL 332   Globulin, Total Latest Ref Range: 1.5 - 4.5 g/dL  3.3   Results for CAMPBELL, KRAY (MRN [id number]) as of 04/28/2019 14:43  Ref. Range 01/28/2019 16:06  WBC Latest Ref Range: 3.4 - 10.8 x10E3/uL 6.2  RBC Latest Ref Range: 4.14 - 5.80 x10E6/uL 3.96 (L)  Hemoglobin Latest Ref Range: 13.0 - 17.7  g/dL 40.911.7 (L)  HCT Latest Ref Range: 37.5 - 51.0 % 36.4 (L)  MCV Latest Ref Range: 79 - 97 fL 92  MCH Latest Ref Range: 26.6 - 33.0 pg 29.5  MCHC Latest Ref Range: 31.5 - 35.7 g/dL 81.132.1  RDW Latest Ref Range: 11.6 - 15.4 % 14.2  Platelets Latest Ref Range: 150 - 450 x10E3/uL 194     Review of  Systems  Constitution: Negative for decreased appetite, malaise/fatigue, weight gain and weight loss.  HENT: Negative for congestion.   Eyes: Negative for visual disturbance.  Cardiovascular: Negative for chest pain, dyspnea on exertion, leg swelling, palpitations and syncope.  Respiratory: Negative for cough.   Endocrine: Negative for cold intolerance.  Hematologic/Lymphatic: Does not bruise/bleed easily.  Skin: Negative for itching and rash.  Musculoskeletal: Positive for joint pain (Both knees). Negative for myalgias.  Gastrointestinal: Negative for abdominal pain, nausea and vomiting.  Genitourinary: Negative for dysuria.  Neurological: Negative for dizziness and weakness.  Psychiatric/Behavioral: The patient is not nervous/anxious.   All other systems reviewed and are negative.        Vitals:   07/28/19 0859  BP: (!) 143/67  Pulse: 65  SpO2: 96%     Body mass index is 32.58 kg/m. Filed Weights   07/28/19 0859  Weight: 240 lb 3.2 oz (109 kg)      Objective:    Physical Exam  Constitutional: He is oriented to person, place, and time. He appears well-developed and well-nourished. No distress.  HENT:  Head: Normocephalic and atraumatic.  Eyes: Pupils are equal, round, and reactive to light. Conjunctivae are normal.  Neck: No JVD present.  Cardiovascular: Normal rate and regular rhythm. Exam reveals decreased pulses.  Pulmonary/Chest: Effort normal and breath sounds normal. He has no wheezes. He has no rales.  Abdominal: Soft. Bowel sounds are normal. There is no rebound.  Musculoskeletal:        General: No edema.  Lymphadenopathy:    He has no cervical adenopathy.  Neurological: He is alert and oriented to person, place, and time. No cranial nerve deficit.  Skin: Skin is warm and dry.  Psychiatric: He has a normal mood and affect.  Nursing note and vitals reviewed.         Assessment & Recommendations:   85 year old African-American male with severe  bilateral PAD with class III claudication, now s/p successful DCB Rt SFA and 2 overlapping Zilver PTX stents placed left SFA (12/2017), s/p left temporal occipital cortical infarct (05/2018).   Hypertension: Fairly well controlled.  Continue current antihypertensive therapy.  Hypertriglyceridemia: Improved to 209.  Continue Lipitor 40 mg daily.  First-degree AV block: Stable on low-dose beta-blocker.  No symptoms of presyncope or syncope.  Continue monitoring.  PAD: Mild his distal pulses are diminished, extremities are warm, capillary refill is excellent.  I suspect his bilateral knee pain is more likely due to osteoarthritis than claudication.  Continue medical management for PAD at this time.  I started him on Plavix 75 mg daily.  Encouraged regular walking.  Follow-up in 6 months.  Elder NegusManish J Arel Tippen, MD [facility] Pager: [phone] Office: [phone] If no answer Cell [phone]

## 2019-08-03 ENCOUNTER — Other Ambulatory Visit: Payer: Self-pay | Admitting: Cardiology

## 2019-08-11 ENCOUNTER — Ambulatory Visit (INDEPENDENT_AMBULATORY_CARE_PROVIDER_SITE_OTHER): Payer: Medicare HMO | Admitting: Nurse Practitioner

## 2019-08-11 ENCOUNTER — Other Ambulatory Visit: Payer: Self-pay

## 2019-08-11 ENCOUNTER — Encounter: Payer: Self-pay | Admitting: Nurse Practitioner

## 2019-08-11 VITALS — BP 144/70 | HR 57 | Temp 98.6°F | Ht 67.8 in | Wt 246.4 lb

## 2019-08-11 DIAGNOSIS — E782 Mixed hyperlipidemia: Secondary | ICD-10-CM

## 2019-08-11 DIAGNOSIS — E781 Pure hyperglyceridemia: Secondary | ICD-10-CM | POA: Diagnosis not present

## 2019-08-11 DIAGNOSIS — Z23 Encounter for immunization: Secondary | ICD-10-CM

## 2019-08-11 DIAGNOSIS — I739 Peripheral vascular disease, unspecified: Secondary | ICD-10-CM

## 2019-08-11 DIAGNOSIS — I10 Essential (primary) hypertension: Secondary | ICD-10-CM | POA: Diagnosis not present

## 2019-08-11 NOTE — Patient Instructions (Signed)
Influenza Virus Vaccine (Flucelvax) What is this medicine? INFLUENZA VIRUS VACCINE (in floo EN zuh VAHY ruhs vak SEEN) helps to reduce the risk of getting influenza also known as the flu. The vaccine only helps protect you against some strains of the flu. This medicine may be used for other purposes; ask your health care provider or pharmacist if you have questions. COMMON BRAND NAME(S): FLUCELVAX What should I tell my health care provider before I take this medicine? They need to know if you have any of these conditions:  bleeding disorder like hemophilia  fever or infection  Guillain-Barre syndrome or other neurological problems  immune system problems  infection with the human immunodeficiency virus (HIV) or AIDS  low blood platelet counts  multiple sclerosis  an unusual or allergic reaction to influenza virus vaccine, other medicines, foods, dyes or preservatives  pregnant or trying to get pregnant  breast-feeding How should I use this medicine? This vaccine is for injection into a muscle. It is given by a health care professional. A copy of Vaccine Information Statements will be given before each vaccination. Read this sheet carefully each time. The sheet may change frequently. Talk to your pediatrician regarding the use of this medicine in children. Special care may be needed. Overdosage: If you think you've taken too much of this medicine contact a poison control center or emergency room at once. Overdosage: If you think you have taken too much of this medicine contact a poison control center or emergency room at once. NOTE: This medicine is only for you. Do not share this medicine with others. What if I miss a dose? This does not apply. What may interact with this medicine?  chemotherapy or radiation therapy  medicines that lower your immune system like etanercept, anakinra, infliximab, and adalimumab  medicines that treat or prevent blood clots like  warfarin  phenytoin  steroid medicines like prednisone or cortisone  theophylline  vaccines This list may not describe all possible interactions. Give your health care provider a list of all the medicines, herbs, non-prescription drugs, or dietary supplements you use. Also tell them if you smoke, drink alcohol, or use illegal drugs. Some items may interact with your medicine. What should I watch for while using this medicine? Report any side effects that do not go away within 3 days to your doctor or health care professional. Call your health care provider if any unusual symptoms occur within 6 weeks of receiving this vaccine. You may still catch the flu, but the illness is not usually as bad. You cannot get the flu from the vaccine. The vaccine will not protect against colds or other illnesses that may cause fever. The vaccine is needed every year. What side effects may I notice from receiving this medicine? Side effects that you should report to your doctor or health care professional as soon as possible:  allergic reactions like skin rash, itching or hives, swelling of the face, lips, or tongue Side effects that usually do not require medical attention (Report these to your doctor or health care professional if they continue or are bothersome.):  fever  headache  muscle aches and pains  pain, tenderness, redness, or swelling at the injection site  tiredness This list may not describe all possible side effects. Call your doctor for medical advice about side effects. You may report side effects to FDA at 1-800-FDA-1088. Where should I keep my medicine? The vaccine will be given by a health care professional in a clinic, pharmacy, doctor's   office, or other health care setting. You will not be given vaccine doses to store at home. NOTE: This sheet is a summary. It may not cover all possible information. If you have questions about this medicine, talk to your doctor, pharmacist, or  health care provider.  2020 Elsevier/Gold Standard (2011-10-23 14:06:47)  

## 2019-08-11 NOTE — Progress Notes (Signed)
Subjective:     Patient ID: Jesus Lam , male    DOB: 06/20/1933 , 85 y.o.   MRN: [id number]   Chief Complaint  Patient presents with  . Hypertension    HPI  His biopsy of his thyroid was benign.  He does not have any complaints today.  Hypertension This is a chronic problem. The current episode started more than 1 year ago. The problem is unchanged. The problem is controlled. Pertinent negatives include no anxiety or headaches.     Past Medical History:  Diagnosis Date  . Anemia   . BPH (benign prostatic hyperplasia)   . Cataracts, bilateral   . CVA (cerebral vascular accident) (HCC)   . DVT (deep venous thrombosis) (HCC)    dvt in left leg  . Dyslipidemia   . Gout   . Gout   . HTN (hypertension)   . Left leg pain   . Osteoarthritis   . PAD (peripheral artery disease) (HCC)   . Stasis dermatitis   . Stroke (HCC)   . Vitamin D deficiency      Family History  Problem Relation Age of Onset  . Cancer Mother   . Cancer Father   . Alcohol abuse Father   . Breast cancer Daughter   . Stroke Daughter   . CVA Daughter      Current Outpatient Medications:  .  amLODipine (NORVASC) 5 MG tablet, TAKE ONE TABLET BY MOUTH DAILY, Disp: 90 tablet, Rfl: 2 .  atorvastatin (LIPITOR) 40 MG tablet, Take 2 tablets (80 mg total) by mouth daily., Disp: 90 tablet, Rfl: 2 .  bisoprolol (ZEBETA) 5 MG tablet, Take 0.5 tablets (2.5 mg total) by mouth daily., Disp: 60 tablet, Rfl: 2 .  chlorthalidone (HYGROTON) 25 MG tablet, Take 25 mg by mouth daily. , Disp: , Rfl:  .  Cholecalciferol (VITAMIN D3) 5000 units CAPS, Take 5,000 Units by mouth daily., Disp: , Rfl:  .  clopidogrel (PLAVIX) 75 MG tablet, Take 1 tablet (75 mg total) by mouth daily., Disp: 90 tablet, Rfl: 3 .  Elastic Bandages & Supports (MEDICAL COMPRESSION STOCKINGS) MISC, , Disp: , Rfl:  .  furosemide (LASIX) 20 MG tablet, Take 1 tablet (20 mg total) by mouth daily., Disp: 90 tablet, Rfl: 0 .  gabapentin (NEURONTIN) 300 MG  capsule, TAKE ONE CAPSULE BY MOUTH AT BEDTIME, Disp: 90 capsule, Rfl: 0 .  isosorbide-hydrALAZINE (BIDIL) 20-37.5 MG tablet, Take by mouth. , Disp: , Rfl:  .  ketorolac (ACULAR) 0.5 % ophthalmic solution, ketorolac 0.5 % eye drops, Disp: , Rfl:  .  Menthol-Methyl Salicylate (MUSCLE RUB) 10-15 % CREA, muscle rub, Disp: , Rfl:  .  methimazole (TAPAZOLE) 10 MG tablet, Take 1 tablet (10 mg total) by mouth daily., Disp: 30 tablet, Rfl: 6 .  Multiple Vitamins-Minerals (COMPLETE SENIOR PO), 1 tablet daily. , Disp: , Rfl:  .  Omega-3 Fatty Acids (FISH OIL PO), Take 1,000 mg by mouth. , Disp: , Rfl:  .  tamsulosin (FLOMAX) 0.4 MG CAPS capsule, TAKE ONE CAPSULE BY MOUTH DAILY . TAKE HALF HOUR FOLLOWING THE SAME MEAL DAILY, Disp: 30 capsule, Rfl: 0 .  vitamin C (ASCORBIC ACID) 500 MG tablet, Take 500 mg daily by mouth., Disp: , Rfl:    Allergies  Allergen Reactions  . Shellfish Allergy Other (See Comments)    Gout      Review of Systems  Constitutional: Negative.   Respiratory: Negative.   Cardiovascular: Negative.   Musculoskeletal: Negative.  Uses cane  Skin: Negative.   Neurological: Negative for dizziness and headaches.     Today's Vitals   08/11/19 1110  BP: (!) 144/70  Pulse: (!) 57  Temp: 98.6 F (37 C)  TempSrc: Oral  Weight: 246 lb 6.4 oz (111.8 kg)  Height: 5' 7.8" (1.722 m)  PainSc: 8   PainLoc: Knee   Body mass index is 37.69 kg/m.   Objective:  Physical Exam Vitals signs reviewed.  Constitutional:      Appearance: Normal appearance. He is obese.  Cardiovascular:     Rate and Rhythm: Normal rate and regular rhythm.     Pulses: Normal pulses.     Heart sounds: Normal heart sounds. No murmur.  Pulmonary:     Effort: Pulmonary effort is normal.     Breath sounds: Normal breath sounds.  Skin:    General: Skin is warm and dry.     Capillary Refill: Capillary refill takes less than 2 seconds.  Neurological:     General: No focal deficit present.     Mental  Status: He is alert and oriented to person, place, and time.         Assessment And Plan:    1. Need for influenza vaccination  Influenza vaccine given in office  Advised to take Tylenol as needed for muscle aches or fever - Flu vaccine HIGH DOSE PF (Fluzone High dose)  2. Essential hypertension . B/P is in fair control.  . CMP ordered to check renal function.  . The importance of regular exercise and dietary modification was stressed to the patient.  . Stressed importance of losing ten percent of her body weight to help with B/P control. .  - CMP14 + Anion Gap; Future - CMP14 + Anion Gap  3. Peripheral artery disease (HCC)  Chronic, stable  Started on Plavix by Dr. Virgina Jock  4. Hypertriglyceridemia  Chronic, controlled  Continue with current medications  5. Mixed hyperlipidemia  Chronic, controlled  Continue with current medications - Lipid panel      Minette Brine, FNP    THE PATIENT IS ENCOURAGED TO PRACTICE SOCIAL DISTANCING DUE TO THE COVID-19 PANDEMIC.

## 2019-08-12 LAB — CMP14 + ANION GAP
ALT: 13 IU/L (ref 0–44)
AST: 20 IU/L (ref 0–40)
Albumin/Globulin Ratio: 1.3 (ref 1.2–2.2)
Albumin: 4.1 g/dL (ref 3.6–4.6)
Alkaline Phosphatase: 91 IU/L (ref 39–117)
Anion Gap: 11 mmol/L (ref 10.0–18.0)
BUN/Creatinine Ratio: 11 (ref 10–24)
BUN: 15 mg/dL (ref 8–27)
Bilirubin Total: 0.5 mg/dL (ref 0.0–1.2)
CO2: 27 mmol/L (ref 20–29)
Calcium: 9.5 mg/dL (ref 8.6–10.2)
Chloride: 107 mmol/L — ABNORMAL HIGH (ref 96–106)
Creatinine, Ser: 1.4 mg/dL — ABNORMAL HIGH (ref 0.76–1.27)
GFR calc Af Amer: 53 mL/min/{1.73_m2} — ABNORMAL LOW (ref >59)
GFR calc non Af Amer: 46 mL/min/{1.73_m2} — ABNORMAL LOW (ref >59)
Globulin, Total: 3.2 g/dL (ref 1.5–4.5)
Glucose: 91 mg/dL (ref 65–99)
Potassium: 4.7 mmol/L (ref 3.5–5.2)
Sodium: 145 mmol/L — ABNORMAL HIGH (ref 134–144)
Total Protein: 7.3 g/dL (ref 6.0–8.5)

## 2019-08-17 ENCOUNTER — Ambulatory Visit (INDEPENDENT_AMBULATORY_CARE_PROVIDER_SITE_OTHER): Payer: Medicare HMO

## 2019-08-17 DIAGNOSIS — R471 Dysarthria and anarthria: Secondary | ICD-10-CM

## 2019-08-17 DIAGNOSIS — I10 Essential (primary) hypertension: Secondary | ICD-10-CM

## 2019-08-17 DIAGNOSIS — I739 Peripheral vascular disease, unspecified: Secondary | ICD-10-CM

## 2019-08-17 DIAGNOSIS — N189 Chronic kidney disease, unspecified: Secondary | ICD-10-CM

## 2019-08-17 DIAGNOSIS — E0590 Thyrotoxicosis, unspecified without thyrotoxic crisis or storm: Secondary | ICD-10-CM

## 2019-08-18 NOTE — Patient Instructions (Signed)
Visit Information  Goals Addressed      Patient Stated   . "I am having some knee pain" (pt-stated)       Current Barriers:  Marland Kitchen Knowledge Deficits related to diagnosis and treatment for bilateral knee pain  . Pain . Impaired Physical Mobility  . High Risk for falls   Nurse Case Manager Clinical Goal(s):  Marland Kitchen Over the next 30 days, patient will verbalize understanding of plan for treatment of his knee pain  . Over the next 90 days, patient will report having decreased pain and improved ambulation w/o falls   CCM RN CM Interventions:  08/17/19 call completed with patient   . Evaluation of current treatment plan related to bilateral knee pain and patient's adherence to plan as established by provider. . Advised patient to use his cane or rollator at all times and to wear good supportives shoes with ambulation; discussed avoiding to walk on uneven ground and to stop and sit down if he becomes tired or has increased stiffness or pain in his knees; encouraged to remove scatter rugs from his home and to keep the floors clutter free . Discussed plans with patient for ongoing care management follow up and provided patient with direct contact information for care management team . Discussed patient will follow up with his Orthopedic doctor next week and will plan to use [employer] transportation if his daughter is unable to drive him  Patient Self Care Activities:  . Self administers medications as prescribed . Attends all scheduled provider appointments . Calls pharmacy for medication refills . Performs ADL's independently . Performs IADL's independently . Calls provider office for new concerns or questions  Initial goal documentation     . COMPLETED: "I had an ultrasound of my thyroid last week" (pt-stated)       Current Barriers:  Marland Kitchen Knowledge Deficits related to Thyroid Ultrasound Results plan for further evaluation  Nurse Case Manager Clinical Goal(s):  Marland Kitchen Over the next 30 days, patient  will verbalize understanding of plan for evaluation of thyroid nodule - Goal Met  CCM RN CM Interventions:  08/17/19 call completed with patient   . Evaluation of current treatment plan related to Hyperthyroidism and patient's adherence to plan as established by provider. . Reviewed medications with patient and discussed patient is taking his Methimazole 10 mg qd as directed w/o missed doses for Hyperthyroidism; discussed patient is aware that he will continue to take this medication unless otherwise directed and will have this condition monitored at least yearly by PCP unless otherwise recommended by PCP ; discussed patient does not notice having any symptoms related to this condition  . Discussed plans with patient for ongoing care management follow up and provided patient with direct contact information for care management team  Patient Self Care Activities:   Daughter verbalizes understanding of the education/information provided today  Self monitors BP at home . Self administers medications as prescribed . Attends all scheduled provider appointments . Calls pharmacy for medication refills . Attends church or other social activities . Performs ADL's independently . Performs IADL's independently . Calls provider office for new concerns or questions   Please see past updates related to this goal by clicking on the "Past Updates" button in the selected goal       Other   . "Needs Nephrology referral for elevated creatinine"        Per provider, Audery Amel, PA-C  Current Barriers:  Marland Kitchen Knowledge Deficits related to Chronic Kidney Disease process and  treatment  Nurse Case Manager Clinical Goal(s):  Marland Kitchen Over the next 30 days, patient/daughter will verbalize basic understanding of Chronic Kidney disease process and self health management plan as evidenced by patient and daughter [name] will verbalize increased knowledge and understanding of importance of patient following up with a  Nephrologist to evaluate and treat patient's Chronic Kidney disease. Goal Complete . Over the next 30 days, patient will have a referral in place to follow up with a Nephrologist to evaluate and treat elevated serum creatinine. Goal Complete  CCM RN CM Interventions:  08/17/19 call completed with patient   . Evaluation of current treatment plan related to Chronic Kidney Disease and patient's adherence to plan as established by provider. . Advised patient to continue practicing healthy lifestyle habits to help keep his kidneys functioning well including to continue to drink plenty of water to stay well hydrated and to keep kidneys well flushed; maintain a BP of 130/80, eat a low fat, low sodium well balanced diet, get plenty of sleep, implement activity/exercise into his daily routine and avoid tobacco or non-tobacco products; discussed importance of taking his prescribed medications w/o missed doses . Discussed plans with patient for ongoing care management follow up and provided patient with direct contact information for care management team . Discussed a referral to Nephrologist Dr. Edrick Oh was sent by PCP in April to manage his CKD - patient is unsure if he has been evaluated by this physician but will discuss with his daughter   Patient Self Care Activities:   Daughter Lanier Ensign understanding of instructions/education provided today  . Self administers medications as prescribed . Attends all scheduled provider appointments . Performs ADL's independently . Calls provider office for new concerns or questions  Please see past updates related to this goal by clicking on the "Past Updates" button in the selected goal         The patient verbalized understanding of instructions provided today and declined a print copy of patient instruction materials.   Telephone follow up appointment with care management team member scheduled for: 09/14/19  Barb Merino, RN, BSN, CCM Care  Management Coordinator [facility]  Direct Phone: [phone]

## 2019-08-18 NOTE — Chronic Care Management (AMB) (Signed)
Chronic Care Management   Follow Up Note   08/17/2019 Name: Lejend Dalby MRN: [id number] DOB: 09/15/1933  Referred by: Minette Brine, FNP Reason for referral : Chronic Care Management (CCM RNCM Telephone Follow up )   Jahseh Lucchese is a 85 y.o. year old male who is a primary care patient of Minette Brine, [place]. The CCM team was consulted for assistance with chronic disease management and care coordination needs.    Review of patient status, including review of consultants reports, relevant laboratory and other test results, and collaboration with appropriate care team members and the patient's provider was performed as part of comprehensive patient evaluation and provision of chronic care management services.    SDOH (Social Determinants of Health) screening performed today: None. See Care Plan for related entries.   Advanced Directives Status: N See Care Plan and [employer] application for related entries.  I spoke with Mr. Zwart by telephone today for a CCM RN follow up.   Outpatient Encounter Medications as of 08/17/2019  Medication Sig  . amLODipine (NORVASC) 5 MG tablet TAKE ONE TABLET BY MOUTH DAILY  . atorvastatin (LIPITOR) 40 MG tablet Take 2 tablets (80 mg total) by mouth daily.  . bisoprolol (ZEBETA) 5 MG tablet Take 0.5 tablets (2.5 mg total) by mouth daily.  . chlorthalidone (HYGROTON) 25 MG tablet Take 25 mg by mouth daily.   . Cholecalciferol (VITAMIN D3) 5000 units CAPS Take 5,000 Units by mouth daily.  . clopidogrel (PLAVIX) 75 MG tablet Take 1 tablet (75 mg total) by mouth daily.  Regino Schultze Bandages & Supports (MEDICAL COMPRESSION STOCKINGS) MISC   . furosemide (LASIX) 20 MG tablet Take 1 tablet (20 mg total) by mouth daily.  Marland Kitchen gabapentin (NEURONTIN) 300 MG capsule TAKE ONE CAPSULE BY MOUTH AT BEDTIME  . isosorbide-hydrALAZINE (BIDIL) 20-37.5 MG tablet Take by mouth.   Marland Kitchen ketorolac (ACULAR) 0.5 % ophthalmic solution ketorolac 0.5 % eye drops  . Menthol-Methyl  Salicylate (MUSCLE RUB) 10-15 % CREA muscle rub  . methimazole (TAPAZOLE) 10 MG tablet Take 1 tablet (10 mg total) by mouth daily.  . Multiple Vitamins-Minerals (COMPLETE SENIOR PO) 1 tablet daily.   . Omega-3 Fatty Acids (FISH OIL PO) Take 1,000 mg by mouth.   . tamsulosin (FLOMAX) 0.4 MG CAPS capsule TAKE ONE CAPSULE BY MOUTH DAILY . TAKE HALF HOUR FOLLOWING THE SAME MEAL DAILY  . vitamin C (ASCORBIC ACID) 500 MG tablet Take 500 mg daily by mouth.   No facility-administered encounter medications on file as of 08/17/2019.      Goals Addressed      Patient Stated   . "I am having some knee pain" (pt-stated)       Current Barriers:  Marland Kitchen Knowledge Deficits related to diagnosis and treatment for bilateral knee pain  . Pain . Impaired Physical Mobility  . High Risk for falls   Nurse Case Manager Clinical Goal(s):  Marland Kitchen Over the next 30 days, patient will verbalize understanding of plan for treatment of his knee pain  . Over the next 90 days, patient will report having decreased pain and improved ambulation w/o falls   CCM RN CM Interventions:  08/17/19 call completed with patient   . Evaluation of current treatment plan related to bilateral knee pain and patient's adherence to plan as established by provider. . Advised patient to use his cane or rollator at all times and to wear good supportives shoes with ambulation; discussed avoiding to walk on uneven ground and to stop and sit  down if he becomes tired or has increased stiffness or pain in his knees; encouraged to remove scatter rugs from his home and to keep the floors clutter free . Discussed plans with patient for ongoing care management follow up and provided patient with direct contact information for care management team . Discussed patient will follow up with his Orthopedic doctor next week and will plan to use [employer] transportation if his daughter is unable to drive him  Patient Self Care Activities:  . Self administers medications  as prescribed . Attends all scheduled provider appointments . Calls pharmacy for medication refills . Performs ADL's independently . Performs IADL's independently . Calls provider office for new concerns or questions  Initial goal documentation     . COMPLETED: "I had an ultrasound of my thyroid last week" (pt-stated)       Current Barriers:  Marland Kitchen Knowledge Deficits related to Thyroid Ultrasound Results plan for further evaluation  Nurse Case Manager Clinical Goal(s):  Marland Kitchen Over the next 30 days, patient will verbalize understanding of plan for evaluation of thyroid nodule - Goal Met  CCM RN CM Interventions:  08/17/19 call completed with patient   . Evaluation of current treatment plan related to Hyperthyroidism and patient's adherence to plan as established by provider. . Reviewed medications with patient and discussed patient is taking his Methimazole 10 mg qd as directed w/o missed doses for Hyperthyroidism; discussed patient is aware that he will continue to take this medication unless otherwise directed and will have this condition monitored at least yearly by PCP unless otherwise recommended by PCP ; discussed patient does not notice having any symptoms related to this condition  . Discussed plans with patient for ongoing care management follow up and provided patient with direct contact information for care management team  Patient Self Care Activities:   Daughter verbalizes understanding of the education/information provided today  Self monitors BP at home . Self administers medications as prescribed . Attends all scheduled provider appointments . Calls pharmacy for medication refills . Attends church or other social activities . Performs ADL's independently . Performs IADL's independently . Calls provider office for new concerns or questions   Please see past updates related to this goal by clicking on the "Past Updates" button in the selected goal         Other   .  "Needs Nephrology referral for elevated creatinine"        Per provider, Audery Amel, PA-C  Current Barriers:  Marland Kitchen Knowledge Deficits related to Chronic Kidney Disease process and treatment  Nurse Case Manager Clinical Goal(s):  Marland Kitchen Over the next 30 days, patient/daughter will verbalize basic understanding of Chronic Kidney disease process and self health management plan as evidenced by patient and daughter [name] will verbalize increased knowledge and understanding of importance of patient following up with a Nephrologist to evaluate and treat patient's Chronic Kidney disease. Goal Complete . Over the next 30 days, patient will have a referral in place to follow up with a Nephrologist to evaluate and treat elevated serum creatinine. Goal Complete  CCM RN CM Interventions:  08/17/19 call completed with patient   . Evaluation of current treatment plan related to Chronic Kidney Disease and patient's adherence to plan as established by provider. . Advised patient to continue practicing healthy lifestyle habits to help keep his kidneys functioning well including to continue to drink plenty of water to stay well hydrated and to keep kidneys well flushed; maintain a BP of 130/80, eat a  low fat, low sodium well balanced diet, get plenty of sleep, implement activity/exercise into his daily routine and avoid tobacco or non-tobacco products; discussed importance of taking his prescribed medications w/o missed doses . Discussed plans with patient for ongoing care management follow up and provided patient with direct contact information for care management team . Discussed a referral to Nephrologist Dr. Edrick Oh was sent by PCP in April to manage his CKD - patient is unsure if he has been evaluated by this physician but will discuss with his daughter   Patient Self Care Activities:   Daughter Lanier Ensign understanding of instructions/education provided today  . Self administers medications as  prescribed . Attends all scheduled provider appointments . Performs ADL's independently . Calls provider office for new concerns or questions  Please see past updates related to this goal by clicking on the "Past Updates" button in the selected goal         Telephone follow up appointment with care management team member scheduled for: 09/14/19   Barb Merino, RN, BSN, CCM Care Management Coordinator [place] [facility]  Direct Phone: [phone]

## 2019-08-20 ENCOUNTER — Other Ambulatory Visit: Payer: Self-pay | Admitting: Cardiology

## 2019-08-20 DIAGNOSIS — E781 Pure hyperglyceridemia: Secondary | ICD-10-CM

## 2019-08-20 DIAGNOSIS — E782 Mixed hyperlipidemia: Secondary | ICD-10-CM

## 2019-08-27 DIAGNOSIS — N189 Chronic kidney disease, unspecified: Secondary | ICD-10-CM | POA: Diagnosis not present

## 2019-08-27 DIAGNOSIS — I509 Heart failure, unspecified: Secondary | ICD-10-CM | POA: Diagnosis not present

## 2019-08-27 DIAGNOSIS — N400 Enlarged prostate without lower urinary tract symptoms: Secondary | ICD-10-CM | POA: Diagnosis not present

## 2019-08-27 DIAGNOSIS — I639 Cerebral infarction, unspecified: Secondary | ICD-10-CM | POA: Diagnosis not present

## 2019-08-27 DIAGNOSIS — N183 Chronic kidney disease, stage 3 unspecified: Secondary | ICD-10-CM | POA: Diagnosis not present

## 2019-08-27 DIAGNOSIS — D631 Anemia in chronic kidney disease: Secondary | ICD-10-CM | POA: Diagnosis not present

## 2019-08-27 DIAGNOSIS — N2581 Secondary hyperparathyroidism of renal origin: Secondary | ICD-10-CM | POA: Diagnosis not present

## 2019-08-27 DIAGNOSIS — I739 Peripheral vascular disease, unspecified: Secondary | ICD-10-CM | POA: Diagnosis not present

## 2019-08-27 DIAGNOSIS — I129 Hypertensive chronic kidney disease with stage 1 through stage 4 chronic kidney disease, or unspecified chronic kidney disease: Secondary | ICD-10-CM | POA: Diagnosis not present

## 2019-08-27 DIAGNOSIS — E0590 Thyrotoxicosis, unspecified without thyrotoxic crisis or storm: Secondary | ICD-10-CM | POA: Diagnosis not present

## 2019-09-01 ENCOUNTER — Other Ambulatory Visit: Payer: Self-pay | Admitting: Nephrology

## 2019-09-01 DIAGNOSIS — N183 Chronic kidney disease, stage 3 unspecified: Secondary | ICD-10-CM

## 2019-09-08 ENCOUNTER — Other Ambulatory Visit: Payer: Self-pay | Admitting: Cardiology

## 2019-09-08 DIAGNOSIS — E781 Pure hyperglyceridemia: Secondary | ICD-10-CM

## 2019-09-09 ENCOUNTER — Other Ambulatory Visit: Payer: Medicare HMO

## 2019-09-10 ENCOUNTER — Ambulatory Visit
Admission: RE | Admit: 2019-09-10 | Discharge: 2019-09-10 | Disposition: A | Discharge status: Home / Self Care | Payer: Medicare HMO | Source: Ambulatory Visit | Attending: Nephrology | Admitting: Nephrology

## 2019-09-10 DIAGNOSIS — N1330 Unspecified hydronephrosis: Secondary | ICD-10-CM | POA: Diagnosis not present

## 2019-09-10 DIAGNOSIS — N183 Chronic kidney disease, stage 3 unspecified: Secondary | ICD-10-CM

## 2019-09-14 ENCOUNTER — Telehealth: Payer: Self-pay

## 2019-09-20 DIAGNOSIS — H524 Presbyopia: Secondary | ICD-10-CM | POA: Diagnosis not present

## 2019-09-20 DIAGNOSIS — H52209 Unspecified astigmatism, unspecified eye: Secondary | ICD-10-CM | POA: Diagnosis not present

## 2019-09-20 DIAGNOSIS — H5203 Hypermetropia, bilateral: Secondary | ICD-10-CM | POA: Diagnosis not present

## 2019-10-01 DIAGNOSIS — H40013 Open angle with borderline findings, low risk, bilateral: Secondary | ICD-10-CM | POA: Diagnosis not present

## 2019-10-08 ENCOUNTER — Telehealth: Payer: Self-pay

## 2019-10-08 ENCOUNTER — Other Ambulatory Visit: Payer: Self-pay | Admitting: Nurse Practitioner

## 2019-10-11 NOTE — Progress Notes (Signed)
This encounter was created in error - please disregard.

## 2019-10-22 ENCOUNTER — Other Ambulatory Visit: Payer: Self-pay | Admitting: Cardiology

## 2019-10-22 DIAGNOSIS — E782 Mixed hyperlipidemia: Secondary | ICD-10-CM

## 2019-10-22 DIAGNOSIS — E781 Pure hyperglyceridemia: Secondary | ICD-10-CM

## 2019-10-28 ENCOUNTER — Other Ambulatory Visit: Payer: Self-pay | Admitting: Nurse Practitioner

## 2019-10-28 DIAGNOSIS — I10 Essential (primary) hypertension: Secondary | ICD-10-CM

## 2019-11-10 ENCOUNTER — Telehealth: Payer: Self-pay

## 2019-11-12 ENCOUNTER — Ambulatory Visit: Payer: Self-pay

## 2019-11-12 DIAGNOSIS — I10 Essential (primary) hypertension: Secondary | ICD-10-CM

## 2019-11-12 DIAGNOSIS — E782 Mixed hyperlipidemia: Secondary | ICD-10-CM

## 2019-11-12 DIAGNOSIS — R4182 Altered mental status, unspecified: Secondary | ICD-10-CM

## 2019-11-12 NOTE — Chronic Care Management (AMB) (Signed)
  Chronic Care Management   Outreach Note  11/12/2019 Name: Jesus Lam MRN: [id number] DOB: [birth year]  Referred by: Minette Brine, FNP Reason for referral : Chronic Care Management (CCM RNCM Telephone Follow up )   An unsuccessful telephone outreach was attempted today. The patient was referred to the case management team by Minette Brine FNP for assistance with care management and care coordination.   Follow Up Plan: Telephone follow up appointment with care management team member scheduled for:12/28/19  Barb Merino, RN, BSN, CCM Care Management Coordinator [place] [facility]  Direct Phone: [phone]

## 2019-11-15 ENCOUNTER — Encounter: Payer: Self-pay | Admitting: Nurse Practitioner

## 2019-11-15 ENCOUNTER — Other Ambulatory Visit: Payer: Self-pay

## 2019-11-15 ENCOUNTER — Ambulatory Visit (INDEPENDENT_AMBULATORY_CARE_PROVIDER_SITE_OTHER): Payer: Medicare HMO | Admitting: Nurse Practitioner

## 2019-11-15 VITALS — BP 124/70 | HR 58 | Temp 98.4°F | Ht 68.8 in | Wt 248.4 lb

## 2019-11-15 DIAGNOSIS — Z23 Encounter for immunization: Secondary | ICD-10-CM | POA: Diagnosis not present

## 2019-11-15 DIAGNOSIS — E7800 Pure hypercholesterolemia, unspecified: Secondary | ICD-10-CM | POA: Insufficient documentation

## 2019-11-15 DIAGNOSIS — I10 Essential (primary) hypertension: Secondary | ICD-10-CM

## 2019-11-15 DIAGNOSIS — E782 Mixed hyperlipidemia: Secondary | ICD-10-CM | POA: Diagnosis not present

## 2019-11-15 DIAGNOSIS — M79605 Pain in left leg: Secondary | ICD-10-CM | POA: Diagnosis not present

## 2019-11-15 DIAGNOSIS — I739 Peripheral vascular disease, unspecified: Secondary | ICD-10-CM

## 2019-11-15 MED ORDER — TRIAMCINOLONE ACETONIDE 40 MG/ML IJ SUSP
40.0000 mg | Freq: Once | INTRAMUSCULAR | Status: AC
  Administered 2019-11-15: 40 mg via INTRAMUSCULAR

## 2019-11-15 MED ORDER — PREVNAR 13 IM SUSP: 0.5000 mL | INTRAMUSCULAR | 0 refills | Status: AC

## 2019-11-15 NOTE — Progress Notes (Signed)
Subjective:     Patient ID: Jesus Lam , male    DOB: 04-08-1933 , 85 y.o.   MRN: [id number]   Chief Complaint  Patient presents with  . Hypertension    HPI  He had been going to [facility] but could not be seen last week.    Hypertension This is a chronic problem. The current episode started more than 1 year ago. The problem is unchanged. The problem is controlled. Pertinent negatives include no anxiety or headaches. Risk factors for coronary artery disease include obesity and sedentary lifestyle. There are no compliance problems.  There is no history of angina.  Leg Pain  The incident occurred more than 1 week ago. There was no injury mechanism. The pain is present in the left leg. The pain is moderate. The pain has been constant since onset. Pertinent negatives include no inability to bear weight, numbness or tingling. He has tried heat and acetaminophen for the symptoms. The treatment provided no relief.     Past Medical History:  Diagnosis Date  . Anemia   . BPH (benign prostatic hyperplasia)   . Cataracts, bilateral   . CVA (cerebral vascular accident) ([place])   . DVT (deep venous thrombosis) ([facility])    dvt in left leg  . Dyslipidemia   . Gout   . Gout   . HTN (hypertension)   . Left leg pain   . Osteoarthritis   . PAD (peripheral artery disease) ([place])   . Stasis dermatitis   . Stroke ([place])   . Vitamin D deficiency      Family History  Problem Relation Age of Onset  . Cancer Mother   . Cancer Father   . Alcohol abuse Father   . Breast cancer Daughter   . Stroke Daughter   . CVA Daughter      Current Outpatient Medications:  .  amLODipine (NORVASC) 5 MG tablet, TAKE ONE TABLET BY MOUTH DAILY, Disp: 90 tablet, Rfl: 2 .  atorvastatin (LIPITOR) 80 MG tablet, TAKE 1 TABLET ONE TIME DAILY, Disp: 90 tablet, Rfl: 0 .  bisoprolol (ZEBETA) 5 MG tablet, Take 0.5 tablets (2.5 mg total) by mouth daily., Disp: 60 tablet, Rfl: 2 .  chlorthalidone (HYGROTON) 25 MG  tablet, Take 25 mg by mouth daily. , Disp: , Rfl:  .  Cholecalciferol (VITAMIN D3) 5000 units CAPS, Take 5,000 Units by mouth daily., Disp: , Rfl:  .  clopidogrel (PLAVIX) 75 MG tablet, Take 1 tablet (75 mg total) by mouth daily., Disp: 90 tablet, Rfl: 3 .  Elastic Bandages & Supports (MEDICAL COMPRESSION STOCKINGS) MISC, , Disp: , Rfl:  .  furosemide (LASIX) 20 MG tablet, TAKE ONE TABLET BY MOUTH DAILY, Disp: 90 tablet, Rfl: 0 .  gabapentin (NEURONTIN) 300 MG capsule, TAKE ONE CAPSULE BY MOUTH AT BEDTIME, Disp: 90 capsule, Rfl: 0 .  isosorbide-hydrALAZINE (BIDIL) 20-37.5 MG tablet, Take by mouth. , Disp: , Rfl:  .  ketorolac (ACULAR) 0.5 % ophthalmic solution, ketorolac 0.5 % eye drops, Disp: , Rfl:  .  Menthol-Methyl Salicylate (MUSCLE RUB) 10-15 % CREA, muscle rub, Disp: , Rfl:  .  methimazole (TAPAZOLE) 10 MG tablet, Take 1 tablet (10 mg total) by mouth daily., Disp: 30 tablet, Rfl: 6 .  Multiple Vitamins-Minerals (COMPLETE SENIOR PO), 1 tablet daily. , Disp: , Rfl:  .  Omega-3 Fatty Acids (FISH OIL PO), Take 1,000 mg by mouth. , Disp: , Rfl:  .  tamsulosin (FLOMAX) 0.4 MG CAPS capsule, TAKE ONE CAPSULE BY  MOUTH DAILY . TAKE HALF HOUR FOLLOWING THE SAME MEAL DAILY, Disp: 30 capsule, Rfl: 0 .  vitamin C (ASCORBIC ACID) 500 MG tablet, Take 500 mg daily by mouth., Disp: , Rfl:    Allergies  Allergen Reactions  . Shellfish Allergy Other (See Comments)    Gout      Review of Systems  Constitutional: Negative.   Respiratory: Negative.   Cardiovascular: Negative.   Musculoskeletal: Negative.        Uses cane  Skin: Negative.   Neurological: Negative for dizziness, tingling, numbness and headaches.     Today's Vitals   11/15/19 1149  BP: 124/70  Pulse: (!) 58  Temp: 98.4 F (36.9 C)  TempSrc: Oral  Weight: 248 lb 6.4 oz (112.7 kg)  Height: 5' 8.8" (1.748 m)  PainSc: 9   PainLoc: Knee   Body mass index is 36.9 kg/m.   Objective:  Physical Exam Vitals reviewed.   Constitutional:      Appearance: Normal appearance. He is obese.  Cardiovascular:     Rate and Rhythm: Normal rate and regular rhythm.     Pulses: Normal pulses.     Heart sounds: Normal heart sounds. No murmur.  Pulmonary:     Effort: Pulmonary effort is normal.     Breath sounds: Normal breath sounds.  Skin:    General: Skin is warm and dry.     Capillary Refill: Capillary refill takes less than 2 seconds.  Neurological:     General: No focal deficit present.     Mental Status: He is alert and oriented to person, place, and time.         Assessment And Plan:    1. Essential hypertension  Chronic, great control  Continue with current medications - CMP14+EGFR  2. Mixed hyperlipidemia  Chronic, stable  Continue with current medications - Lipid Profile  3. Left leg pain  Knee is tender to medial aspect and slight swelling  Kenalog 49m IM given in office  He can follow up with [facility] - triamcinolone acetonide (KENALOG-40) injection 40 mg  4. Peripheral artery disease ([facility])  Chronic  Continue with Plavix daily  5. Encounter for immunization  prevnar 13 immunization sent to pharmacy - pneumococcal 13-valent conjugate vaccine (PREVNAR 13) SUSP injection; Inject 0.5 mLs into the muscle tomorrow at 10 am for 1 dose.  Dispense: 0.5 mL; Refill: 0      JMinette Brine FNP    THE PATIENT IS ENCOURAGED TO PRACTICE SOCIAL DISTANCING DUE TO THE COVID-19 PANDEMIC.

## 2019-11-24 ENCOUNTER — Other Ambulatory Visit: Payer: Self-pay | Admitting: Nurse Practitioner

## 2019-11-30 ENCOUNTER — Other Ambulatory Visit: Payer: Self-pay

## 2019-12-28 ENCOUNTER — Telehealth: Payer: Self-pay

## 2019-12-29 ENCOUNTER — Other Ambulatory Visit: Payer: Medicare HMO

## 2019-12-29 ENCOUNTER — Other Ambulatory Visit: Payer: Self-pay

## 2019-12-29 DIAGNOSIS — I10 Essential (primary) hypertension: Secondary | ICD-10-CM | POA: Diagnosis not present

## 2019-12-29 DIAGNOSIS — E782 Mixed hyperlipidemia: Secondary | ICD-10-CM | POA: Diagnosis not present

## 2019-12-30 LAB — CMP14+EGFR
ALT: 17 IU/L (ref 0–44)
AST: 20 IU/L (ref 0–40)
Albumin/Globulin Ratio: 1.4 (ref 1.2–2.2)
Albumin: 4 g/dL (ref 3.6–4.6)
Alkaline Phosphatase: 106 IU/L (ref 39–117)
BUN/Creatinine Ratio: 13 (ref 10–24)
BUN: 16 mg/dL (ref 8–27)
Bilirubin Total: 0.2 mg/dL (ref 0.0–1.2)
CO2: 24 mmol/L (ref 20–29)
Calcium: 9.4 mg/dL (ref 8.6–10.2)
Chloride: 108 mmol/L — ABNORMAL HIGH (ref 96–106)
Creatinine, Ser: 1.21 mg/dL (ref 0.76–1.27)
GFR calc Af Amer: 63 mL/min/{1.73_m2} (ref >59)
GFR calc non Af Amer: 54 mL/min/{1.73_m2} — ABNORMAL LOW (ref >59)
Globulin, Total: 2.9 g/dL (ref 1.5–4.5)
Glucose: 120 mg/dL — ABNORMAL HIGH (ref 65–99)
Potassium: 4.4 mmol/L (ref 3.5–5.2)
Sodium: 142 mmol/L (ref 134–144)
Total Protein: 6.9 g/dL (ref 6.0–8.5)

## 2019-12-30 LAB — LIPID PANEL
Chol/HDL Ratio: 3.4 ratio (ref 0.0–5.0)
Cholesterol, Total: 108 mg/dL (ref 100–199)
HDL: 32 mg/dL — ABNORMAL LOW (ref >39)
LDL Chol Calc (NIH): 51 mg/dL (ref 0–99)
Triglycerides: 143 mg/dL (ref 0–149)
VLDL Cholesterol Cal: 25 mg/dL (ref 5–40)

## 2020-01-05 ENCOUNTER — Other Ambulatory Visit: Payer: Self-pay | Admitting: Internal Medicine

## 2020-01-15 ENCOUNTER — Other Ambulatory Visit: Payer: Self-pay | Admitting: Nurse Practitioner

## 2020-01-17 ENCOUNTER — Telehealth: Payer: Self-pay

## 2020-01-19 ENCOUNTER — Other Ambulatory Visit: Payer: Self-pay | Admitting: Nurse Practitioner

## 2020-01-19 ENCOUNTER — Other Ambulatory Visit: Payer: Self-pay | Admitting: Cardiology

## 2020-01-28 ENCOUNTER — Ambulatory Visit: Payer: Medicare HMO | Admitting: Cardiology

## 2020-02-03 ENCOUNTER — Ambulatory Visit (INDEPENDENT_AMBULATORY_CARE_PROVIDER_SITE_OTHER): Payer: Medicare HMO

## 2020-02-03 ENCOUNTER — Ambulatory Visit (INDEPENDENT_AMBULATORY_CARE_PROVIDER_SITE_OTHER): Payer: Medicare HMO | Admitting: Nurse Practitioner

## 2020-02-03 ENCOUNTER — Other Ambulatory Visit: Payer: Self-pay

## 2020-02-03 ENCOUNTER — Encounter: Payer: Self-pay | Admitting: Nurse Practitioner

## 2020-02-03 VITALS — BP 134/70 | HR 58 | Temp 98.7°F | Ht 68.8 in | Wt 250.4 lb

## 2020-02-03 VITALS — BP 134/70 | HR 58 | Temp 98.7°F | Ht 68.8 in | Wt 250.0 lb

## 2020-02-03 DIAGNOSIS — Z1211 Encounter for screening for malignant neoplasm of colon: Secondary | ICD-10-CM | POA: Diagnosis not present

## 2020-02-03 DIAGNOSIS — Z0000 Encounter for general adult medical examination without abnormal findings: Secondary | ICD-10-CM

## 2020-02-03 DIAGNOSIS — R7309 Other abnormal glucose: Secondary | ICD-10-CM

## 2020-02-03 DIAGNOSIS — H6123 Impacted cerumen, bilateral: Secondary | ICD-10-CM

## 2020-02-03 DIAGNOSIS — E782 Mixed hyperlipidemia: Secondary | ICD-10-CM | POA: Diagnosis not present

## 2020-02-03 DIAGNOSIS — I10 Essential (primary) hypertension: Secondary | ICD-10-CM | POA: Diagnosis not present

## 2020-02-03 DIAGNOSIS — I739 Peripheral vascular disease, unspecified: Secondary | ICD-10-CM

## 2020-02-03 DIAGNOSIS — E0590 Thyrotoxicosis, unspecified without thyrotoxic crisis or storm: Secondary | ICD-10-CM | POA: Diagnosis not present

## 2020-02-03 DIAGNOSIS — Z23 Encounter for immunization: Secondary | ICD-10-CM

## 2020-02-03 LAB — POCT URINALYSIS DIPSTICK
Bilirubin, UA: NEGATIVE
Blood, UA: NEGATIVE
Glucose, UA: NEGATIVE
Ketones, UA: NEGATIVE
Leukocytes, UA: NEGATIVE
Nitrite, UA: NEGATIVE
Protein, UA: NEGATIVE
Spec Grav, UA: 1.02 (ref 1.010–1.025)
Urobilinogen, UA: 1 E.U./dL
pH, UA: 6 (ref 5.0–8.0)

## 2020-02-03 LAB — POCT UA - MICROALBUMIN
Albumin/Creatinine Ratio, Urine, POC: 30
Creatinine, POC: 200 mg/dL
Microalbumin Ur, POC: 30 mg/L

## 2020-02-03 MED ORDER — BOOSTRIX 5-2.5-18.5 LF-MCG/0.5 IM SUSP: 0.5000 mL | Freq: Once | INTRAMUSCULAR | 0 refills | Status: AC

## 2020-02-03 MED ORDER — PREVNAR 13 IM SUSP: 0.5000 mL | INTRAMUSCULAR | 0 refills | Status: AC

## 2020-02-03 NOTE — Progress Notes (Signed)
This visit occurred during the SARS-CoV-2 public health emergency.  Safety protocols were in place, including screening questions prior to the visit, additional usage of staff PPE, and extensive cleaning of exam room while observing appropriate contact time as indicated for disinfecting solutions.  Subjective:   Jesus Lam is a 86 y.o. male who presents for Medicare Annual/Subsequent preventive examination.  Review of Systems:  n/a Cardiac Risk Factors include: advanced age (>62men, >17 women);hypertension;male gender;dyslipidemia;sedentary lifestyle;obesity (BMI >30kg/m2)     Objective:    Vitals: BP 134/70 (BP Location: Left Arm, Patient Position: Sitting)   Pulse (!) 58   Temp 98.7 F (37.1 C) (Oral)   Ht 5' 8.8" (1.748 m)   Wt 250 lb (113.4 kg)   BMI 37.13 kg/m   Body mass index is 37.13 kg/m.  Advanced Directives 02/03/2020 02/08/2019 01/28/2019 06/13/2018 06/12/2018 01/27/2018 01/17/2018  Does Patient Have a Medical Advance Directive? No No No No No Yes No  Type of Advance Directive - - - - - Living will -  Does patient want to make changes to medical advance directive? - - - - - No - Patient declined -  Would patient like information on creating a medical advance directive? - - No - Patient declined No - Patient declined No - Patient declined - -    Tobacco Social History   Tobacco Use  Smoking Status Never Smoker  Smokeless Tobacco Never Used     Counseling given: Not Answered   Clinical Intake:  Pre-visit preparation completed: Yes  Pain : 0-10 Pain Score: 7  Pain Type: Chronic pain Pain Location: Knee Pain Orientation: Left Pain Descriptors / Indicators: Aching Pain Onset: More than a month ago Pain Frequency: Intermittent Pain Relieving Factors: cortisone shots help  Pain Relieving Factors: cortisone shots help  Nutritional Status: BMI > 30  Obese Nutritional Risks: None Diabetes: No  How often do you need to have someone help you when you read  instructions, pamphlets, or other written materials from your doctor or pharmacy?: 1 - Never What is the last grade level you completed in school?: 3 years college  Interpreter Needed?: No  Information entered by :: NAllen LPN  Past Medical History:  Diagnosis Date  . Anemia   . BPH (benign prostatic hyperplasia)   . Cataracts, bilateral   . CVA (cerebral vascular accident) (HCC)   . DVT (deep venous thrombosis) (HCC)    dvt in left leg  . Dyslipidemia   . Gout   . Gout   . HTN (hypertension)   . Left leg pain   . Osteoarthritis   . PAD (peripheral artery disease) (HCC)   . Stasis dermatitis   . Stroke (HCC)   . Vitamin D deficiency    Past Surgical History:  Procedure Laterality Date  . ABDOMINAL AORTOGRAM N/A 01/13/2018   Procedure: ABDOMINAL AORTOGRAM;  Surgeon: Elder Negus, MD;  Location: [facility];  Service: Cardiovascular;  Laterality: N/A;  . CATARACT EXTRACTION, BILATERAL    . LEFT HEART CATH AND CORONARY ANGIOGRAPHY N/A 01/13/2018   Procedure: LEFT HEART CATH AND CORONARY ANGIOGRAPHY;  Surgeon: Elder Negus, MD;  Location: [facility];  Service: Cardiovascular;  Laterality: N/A;  . LOWER EXTREMITY ANGIOGRAPHY N/A 01/13/2018   Procedure: LOWER EXTREMITY ANGIOGRAPHY;  Surgeon: Elder Negus, MD;  Location: [facility];  Service: Cardiovascular;  Laterality: N/A;  . LOWER EXTREMITY ANGIOGRAPHY Right 01/27/2018   Procedure: LOWER EXTREMITY ANGIOGRAPHY;  Surgeon: Yates Decamp, MD;  Location: [place] [facility];  Service: Cardiovascular;  Laterality: Right;  . PERIPHERAL VASCULAR ATHERECTOMY Left 01/13/2018   Procedure: PERIPHERAL VASCULAR ATHERECTOMY;  Surgeon: Nigel Mormon, MD;  Location: [facility] [facility];  Service: Cardiovascular;  Laterality: Left;  SFA WITH PTA DRUG COATED BALLOON  . PERIPHERAL VASCULAR INTERVENTION  01/27/2018   Procedure: PERIPHERAL VASCULAR INTERVENTION;  Surgeon: Adrian Prows, MD;  Location: [facility];  Service: Cardiovascular;;   Family History  Problem Relation Age of Onset  . Cancer Mother   . Cancer Father   . Alcohol abuse Father   . Breast cancer Daughter   . Stroke Daughter   . CVA Daughter    Social History   Socioeconomic History  . Marital status: Single    Spouse name: Not on file  . Number of children: 3  . Years of education: Not on file  . Highest education level: Not on file  Occupational History  . Occupation: retired  Tobacco Use  . Smoking status: Never Smoker  . Smokeless tobacco: Never Used  Substance and Sexual Activity  . Alcohol use: Yes    Alcohol/week: 6.0 - 7.0 standard drinks    Types: 6 - 7 Shots of liquor per week    Comment: 6-7 drinks per week  . Drug use: Yes    Frequency: 7.0 times per week    Types: Marijuana    Comment: daily ( 02/09/19)  . Sexual activity: Yes  Other Topics Concern  . Not on file  Social History Narrative   Lives with daughter, [name]. 2 daughters live here in [place]. Son lives in [place].   3/16- offered information on local food pantries as well as congregate meal sites. Information declined.   Social Determinants of Health   Financial Resource Strain: Low Risk   . Difficulty of Paying Living Expenses: Not hard at all  Food Insecurity: No Food Insecurity  . Worried About Charity fundraiser in the Last Year: Never true  . Ran Out of Food in the Last Year: Never true  Transportation Needs: No Transportation Needs  . Lack of Transportation (Medical): No  . Lack of Transportation (Non-Medical): No  Physical Activity: Inactive  . Days of Exercise per Week: 0 days  . Minutes of Exercise per Session: 0 min  Stress: No Stress Concern Present  . Feeling of Stress : Not at all  Social Connections:   . Frequency of Communication with Friends and Family:   . Frequency of Social Gatherings with Friends and Family:   . Attends Religious Services:   . Active Member of Clubs or Organizations:   .  Attends Archivist Meetings:   Marland Kitchen Marital Status:     Outpatient Encounter Medications as of 02/03/2020  Medication Sig  . amLODipine (NORVASC) 5 MG tablet TAKE ONE TABLET BY MOUTH DAILY  . atorvastatin (LIPITOR) 80 MG tablet TAKE 1 TABLET ONE TIME DAILY  . bisoprolol (ZEBETA) 5 MG tablet TAKE 0.5 TABLETS (2.5 MG TOTAL) BY MOUTH DAILY.  . chlorthalidone (HYGROTON) 25 MG tablet Take 25 mg by mouth daily.   . Cholecalciferol (VITAMIN D3) 5000 units CAPS Take 5,000 Units by mouth daily.  . clopidogrel (PLAVIX) 75 MG tablet Take 1 tablet (75 mg total) by mouth daily.  Regino Schultze Bandages & Supports (MEDICAL COMPRESSION STOCKINGS) MISC   . furosemide (LASIX) 20 MG tablet TAKE ONE TABLET BY MOUTH DAILY  . gabapentin (NEURONTIN) 300 MG capsule TAKE ONE CAPSULE BY  MOUTH AT BEDTIME  . isosorbide-hydrALAZINE (BIDIL) 20-37.5 MG tablet Take by mouth.   Marland Kitchen ketorolac (ACULAR) 0.5 % ophthalmic solution ketorolac 0.5 % eye drops  . Menthol-Methyl Salicylate (MUSCLE RUB) 10-15 % CREA muscle rub  . methimazole (TAPAZOLE) 10 MG tablet TAKE ONE TABLET BY MOUTH DAILY  . Multiple Vitamins-Minerals (COMPLETE SENIOR PO) 1 tablet daily.   . Omega-3 Fatty Acids (FISH OIL PO) Take 1,000 mg by mouth.   . pneumococcal 13-valent conjugate vaccine (PREVNAR 13) SUSP injection Inject 0.5 mLs into the muscle tomorrow at 10 am for 1 dose.  . tamsulosin (FLOMAX) 0.4 MG CAPS capsule TAKE ONE CAPSULE BY MOUTH DAILY . TAKE HALF HOUR FOLLOWING THE SAME MEAL DAILY  . Tdap (BOOSTRIX) 5-2.5-18.5 LF-MCG/0.5 injection Inject 0.5 mLs into the muscle once for 1 dose.  . vitamin C (ASCORBIC ACID) 500 MG tablet Take 500 mg daily by mouth.   No facility-administered encounter medications on file as of 02/03/2020.    Activities of Daily Living In your present state of health, do you have any difficulty performing the following activities: 02/03/2020 02/03/2020  Hearing? Y N  Vision? Y N  Comment sometimes -  Difficulty  concentrating or making decisions? Y N  Walking or climbing stairs? Y Y  Comment due to knee -  Dressing or bathing? N N  Doing errands, shopping? N N  Preparing Food and eating ? N -  Using the Toilet? N -  In the past six months, have you accidently leaked urine? Y -  Comment wears a pad -  Do you have problems with loss of bowel control? N -  Managing your Medications? N -  Managing your Finances? N -  Housekeeping or managing your Housekeeping? N -  Some recent data might be hidden    Patient Care Team: [name], FNP as PCP - General (General Practice) Clarene Duke, Karma Lew, RN as Case Manager Bevelyn Ngo as Social Worker   Assessment:   This is a routine wellness examination for Rhyan.  Exercise Activities and Dietary recommendations Current Exercise Habits: The patient does not participate in regular exercise at present  Goals    . "I am having some knee pain" (pt-stated)     Current Barriers:  Marland Kitchen Knowledge Deficits related to diagnosis and treatment for bilateral knee pain  . Pain . Impaired Physical Mobility  . High Risk for falls   Nurse Case Manager Clinical Goal(s):  Marland Kitchen Over the next 30 days, patient will verbalize understanding of plan for treatment of his knee pain  . Over the next 90 days, patient will report having decreased pain and improved ambulation w/o falls   CCM RN CM Interventions:  08/17/19 call completed with patient   . Evaluation of current treatment plan related to bilateral knee pain and patient's adherence to plan as established by provider. . Advised patient to use his cane or rollator at all times and to wear good supportives shoes with ambulation; discussed avoiding to walk on uneven ground and to stop and sit down if he becomes tired or has increased stiffness or pain in his knees; encouraged to remove scatter rugs from his home and to keep the floors clutter free . Discussed plans with patient for ongoing care management follow up and  provided patient with direct contact information for care management team . Discussed patient will follow up with his Orthopedic doctor next week and will plan to use [employer] transportation if his daughter is unable to drive him  Patient Self Care Activities:  . Self administers medications as prescribed . Attends all scheduled provider appointments . Calls pharmacy for medication refills . Performs ADL's independently . Performs IADL's independently . Calls provider office for new concerns or questions  Initial goal documentation       . "I don't always know about his medication changes"     PATIENT'S DAUGHTER STATED  Current Barriers:  . Cognitive Deficits related to altered mental status . Knowledge Deficits related to Medication Management  Nurse Case Manager Clinical Goal(s):  Marland Kitchen Over the next 30 days, patient will verbalize understanding of plan for better medication management including transitioning to blister pill packaging.  . Over the next 30 days, patient/daughter will have a better understanding of patient's current medication regimen including the names of/indication for and dosages needed for all of patient's medications.   Interventions:   Telephone CCM follow up call completed with daughter [name]  Assessed for medication understanding and adherence  Instructed daughter on taking patient's medications to his pharmacy to have the blister pill pack system set up once she is ready   Advised the pharmacy tech Daniel Nones confirmed this service is provided and can be done at any time during the month the patient/family desires  Discussed with daughter [name], no medications changes were recommended by PCP at this time  Encouraged daughter to contact the CCM team if needed before next scheduled call  Confirmed [name] has the contact # for the CCM team and she confirmed . Telephone CCM follow up call scheduled   Patient Self Care Activities:  . Daughter verbalizes  understanding of the education/information provided today . Self administers medications as prescribed . Attends church or other social activities . Performs ADL's independently . Performs IADL's independently . Calls provider office for new concerns or questions  Please see past updates related to this goal by clicking on the "Past Updates" button in the selected goal       . "Needs Nephrology referral for elevated creatinine"      Per provider, Beatrix Fetters, PA-C  Current Barriers:  Marland Kitchen Knowledge Deficits related to Chronic Kidney Disease process and treatment  Nurse Case Manager Clinical Goal(s):  Marland Kitchen Over the next 30 days, patient/daughter will verbalize basic understanding of Chronic Kidney disease process and self health management plan as evidenced by patient and daughter [name] will verbalize increased knowledge and understanding of importance of patient following up with a Nephrologist to evaluate and treat patient's Chronic Kidney disease. Goal Complete . Over the next 30 days, patient will have a referral in place to follow up with a Nephrologist to evaluate and treat elevated serum creatinine. Goal Complete  CCM RN CM Interventions:  08/17/19 call completed with patient   . Evaluation of current treatment plan related to Chronic Kidney Disease and patient's adherence to plan as established by provider. . Advised patient to continue practicing healthy lifestyle habits to help keep his kidneys functioning well including to continue to drink plenty of water to stay well hydrated and to keep kidneys well flushed; maintain a BP of 130/80, eat a low fat, low sodium well balanced diet, get plenty of sleep, implement activity/exercise into his daily routine and avoid tobacco or non-tobacco products; discussed importance of taking his prescribed medications w/o missed doses . Discussed plans with patient for ongoing care management follow up and provided patient with direct contact  information for care management team . Discussed a referral to Nephrologist Dr. Elvis Coil was sent by PCP  in April to manage his CKD - patient is unsure if he has been evaluated by this physician but will discuss with his daughter   Patient Self Care Activities:   Daughter Gayleen Orem understanding of instructions/education provided today  . Self administers medications as prescribed . Attends all scheduled provider appointments . Performs ADL's independently . Calls provider office for new concerns or questions  Please see past updates related to this goal by clicking on the "Past Updates" button in the selected goal       . Exercise 150 min/wk Moderate Activity     [facility]    . Patient Stated (pt-stated)     Wants to get better with memory       Fall Risk Fall Risk  02/03/2020 02/03/2020 11/15/2019 08/11/2019 05/06/2019  Falls in the past year? 0 0 0 0 0  Risk for fall due to : Medication side effect - - - -  Follow up Falls evaluation completed;Education provided;Falls prevention discussed - - - -   Is the patient's home free of loose throw rugs in walkways, pet beds, electrical cords, etc?   yes      Grab bars in the bathroom? yes      Handrails on the stairs?   no      Adequate lighting?   yes  Timed Get Up and Go Performed: n/a  Depression Screen PHQ 2/9 Scores 02/03/2020 02/03/2020 11/15/2019 08/11/2019  PHQ - 2 Score 0 0 0 0  PHQ- 9 Score 0 - - -    Cognitive Function     6CIT Screen 02/03/2020 01/28/2019  What Year? 4 points 0 points  What month? 0 points 0 points  What time? 0 points 0 points  Count back from 20 0 points 4 points  Months in reverse 4 points 0 points  Repeat phrase 6 points 0 points  Total Score 14 4    Immunization History  Administered Date(s) Administered  . Influenza, High Dose Seasonal PF 10/29/2018, 08/11/2019    Qualifies for Shingles Vaccine? yes  Screening Tests Health Maintenance    Topic Date Due  . TETANUS/TDAP  Never done  . PNA vac Low Risk Adult (1 of 2 - PCV13) Never done  . INFLUENZA VACCINE  Completed   Cancer Screenings: Lung: Low Dose CT Chest recommended if Age 68-80 years, 30 pack-year currently smoking OR have quit w/in 15years. Patient does not qualify. Colorectal: not required  Additional Screenings:  Hepatitis C Screening:n/a      Plan:    [facility]  I have personally reviewed and noted the following in the patient's chart:   . Medical and social history . Use of alcohol, tobacco or illicit drugs  . Current medications and supplements . Functional ability and status . Nutritional status . Physical activity . Advanced directives . List of other physicians . Hospitalizations, surgeries, and ER visits in previous 12 months . Vitals . Screenings to include cognitive, depression, and falls . Referrals and appointments  In addition, I have reviewed and discussed with patient certain preventive protocols, quality metrics, and best practice recommendations. A written personalized care plan for preventive services as well as general preventive health recommendations were provided to patient.     Barb Merino, LPN  [id number]

## 2020-02-03 NOTE — Patient Instructions (Signed)
Jesus Lam , Thank you for taking time to come for your Medicare Wellness Visit. I appreciate your ongoing commitment to your health goals. Please review the following plan we discussed and let me know if I can assist you in the future.   Screening recommendations/referrals: Colonoscopy: not required Recommended yearly ophthalmology/optometry visit for glaucoma screening and checkup Recommended yearly dental visit for hygiene and checkup  Vaccinations: Influenza vaccine: 07/2019 Pneumococcal vaccine: sent to pharmacy Tdap vaccine: sent to pharmacy Shingles vaccine: discussed    Advanced directives: Advance directive discussed with you today. Even though you declined this today please call our office should you change your mind and we can give you the proper paperwork for you to fill out.   Conditions/risks identified: obesity  Next appointment: 04/27/2020 at 2:30  Preventive Care 65 Years and Older, Male Preventive care refers to lifestyle choices and visits with your health care provider that can promote health and wellness. What does preventive care include?  A yearly physical exam. This is also called an annual well check.  Dental exams once or twice a year.  Routine eye exams. Ask your health care provider how often you should have your eyes checked.  Personal lifestyle choices, including:  Daily care of your teeth and gums.  Regular physical activity.  Eating a healthy diet.  Avoiding tobacco and drug use.  Limiting alcohol use.  Practicing safe sex.  Taking low doses of aspirin every day.  Taking vitamin and mineral supplements as recommended by your health care provider. What happens during an annual well check? The services and screenings done by your health care provider during your annual well check will depend on your age, overall health, lifestyle risk factors, and family history of disease. Counseling  Your health care provider may ask you questions about  your:  Alcohol use.  Tobacco use.  Drug use.  Emotional well-being.  Home and relationship well-being.  Sexual activity.  Eating habits.  History of falls.  Memory and ability to understand (cognition).  Work and work Astronomer. Screening  You may have the following tests or measurements:  Height, weight, and BMI.  Blood pressure.  Lipid and cholesterol levels. These may be checked every 5 years, or more frequently if you are over 18 years old.  Skin check.  Lung cancer screening. You may have this screening every year starting at age 47 if you have a 30-pack-year history of smoking and currently smoke or have quit within the past 15 years.  Fecal occult blood test (FOBT) of the stool. You may have this test every year starting at age 59.  Flexible sigmoidoscopy or colonoscopy. You may have a sigmoidoscopy every 5 years or a colonoscopy every 10 years starting at age 26.  Prostate cancer screening. Recommendations will vary depending on your family history and other risks.  Hepatitis C blood test.  Hepatitis B blood test.  Sexually transmitted disease (STD) testing.  Diabetes screening. This is done by checking your blood sugar (glucose) after you have not eaten for a while (fasting). You may have this done every 1-3 years.  Abdominal aortic aneurysm (AAA) screening. You may need this if you are a current or former smoker.  Osteoporosis. You may be screened starting at age 2 if you are at high risk. Talk with your health care provider about your test results, treatment options, and if necessary, the need for more tests. Vaccines  Your health care provider may recommend certain vaccines, such as:  Influenza vaccine.  This is recommended every year.  Tetanus, diphtheria, and acellular pertussis (Tdap, Td) vaccine. You may need a Td booster every 10 years.  Zoster vaccine. You may need this after age 71.  Pneumococcal 13-valent conjugate (PCV13) vaccine.  One dose is recommended after age 3.  Pneumococcal polysaccharide (PPSV23) vaccine. One dose is recommended after age 31. Talk to your health care provider about which screenings and vaccines you need and how often you need them. This information is not intended to replace advice given to you by your health care provider. Make sure you discuss any questions you have with your health care provider. Document Released: 12/08/2015 Document Revised: 07/31/2016 Document Reviewed: 09/12/2015 Elsevier Interactive Patient Education  2017 [place] Prevention in the Home Falls can cause injuries. They can happen to people of all ages. There are many things you can do to make your home safe and to help prevent falls. What can I do on the outside of my home?  Regularly fix the edges of walkways and driveways and fix any cracks.  Remove anything that might make you trip as you walk through a door, such as a raised step or threshold.  Trim any bushes or trees on the path to your home.  Use bright outdoor lighting.  Clear any walking paths of anything that might make someone trip, such as rocks or tools.  Regularly check to see if handrails are loose or broken. Make sure that both sides of any steps have handrails.  Any raised decks and porches should have guardrails on the edges.  Have any leaves, snow, or ice cleared regularly.  Use sand or salt on walking paths during winter.  Clean up any spills in your garage right away. This includes oil or grease spills. What can I do in the bathroom?  Use night lights.  Install grab bars by the toilet and in the tub and shower. Do not use towel bars as grab bars.  Use non-skid mats or decals in the tub or shower.  If you need to sit down in the shower, use a plastic, non-slip stool.  Keep the floor dry. Clean up any water that spills on the floor as soon as it happens.  Remove soap buildup in the tub or shower regularly.  Attach bath  mats securely with double-sided non-slip rug tape.  Do not have throw rugs and other things on the floor that can make you trip. What can I do in the bedroom?  Use night lights.  Make sure that you have a light by your bed that is easy to reach.  Do not use any sheets or blankets that are too big for your bed. They should not hang down onto the floor.  Have a firm chair that has side arms. You can use this for support while you get dressed.  Do not have throw rugs and other things on the floor that can make you trip. What can I do in the kitchen?  Clean up any spills right away.  Avoid walking on wet floors.  Keep items that you use a lot in easy-to-reach places.  If you need to reach something above you, use a strong step stool that has a grab bar.  Keep electrical cords out of the way.  Do not use floor polish or wax that makes floors slippery. If you must use wax, use non-skid floor wax.  Do not have throw rugs and other things on the floor that can make  you trip. What can I do with my stairs?  Do not leave any items on the stairs.  Make sure that there are handrails on both sides of the stairs and use them. Fix handrails that are broken or loose. Make sure that handrails are as long as the stairways.  Check any carpeting to make sure that it is firmly attached to the stairs. Fix any carpet that is loose or worn.  Avoid having throw rugs at the top or bottom of the stairs. If you do have throw rugs, attach them to the floor with carpet tape.  Make sure that you have a light switch at the top of the stairs and the bottom of the stairs. If you do not have them, ask someone to add them for you. What else can I do to help prevent falls?  Wear shoes that:  Do not have high heels.  Have rubber bottoms.  Are comfortable and fit you well.  Are closed at the toe. Do not wear sandals.  If you use a stepladder:  Make sure that it is fully opened. Do not climb a closed  stepladder.  Make sure that both sides of the stepladder are locked into place.  Ask someone to hold it for you, if possible.  Clearly mark and make sure that you can see:  Any grab bars or handrails.  First and last steps.  Where the edge of each step is.  Use tools that help you move around (mobility aids) if they are needed. These include:  Canes.  Walkers.  Scooters.  Crutches.  Turn on the lights when you go into a dark area. Replace any light bulbs as soon as they burn out.  Set up your furniture so you have a clear path. Avoid moving your furniture around.  If any of your floors are uneven, fix them.  If there are any pets around you, be aware of where they are.  Review your medicines with your doctor. Some medicines can make you feel dizzy. This can increase your chance of falling. Ask your doctor what other things that you can do to help prevent falls. This information is not intended to replace advice given to you by your health care provider. Make sure you discuss any questions you have with your health care provider. Document Released: 09/07/2009 Document Revised: 04/18/2016 Document Reviewed: 12/16/2014 Elsevier Interactive Patient Education  2017 [employer].

## 2020-02-03 NOTE — Progress Notes (Signed)
This visit occurred during the SARS-CoV-2 public health emergency.  Safety protocols were in place, including screening questions prior to the visit, additional usage of staff PPE, and extensive cleaning of exam room while observing appropriate contact time as indicated for disinfecting solutions.  Subjective:     Patient ID: Jesus Lam , male    DOB: August 14, 1933 , 86 y.o.   MRN: [id number]   Chief Complaint  Patient presents with  . Annual Exam    HPI  Here for HM   He has had his 1st of 2 covid vaccine - he is going out of town next week.  Will schedule his 2nd vaccine.     Men's preventive visit. Patient Health Questionnaire (PHQ-2) is    Office Visit from 02/03/2020 in [facility]  PHQ-2 Total Score  0     Patient is on a regular diet. No current exercise. He feels like since he has not been able to swim he has gained weight.  Marital status: Single. Relevant history for alcohol use is:  Social History   Substance and Sexual Activity  Alcohol Use Yes  . Alcohol/week: 1.0 standard drinks  . Types: 1 Shots of liquor per week   Comment: occasionally   Relevant history for tobacco use is:  Social History   Tobacco Use  Smoking Status Never Smoker  Smokeless Tobacco Never Used   Past Medical History:  Diagnosis Date  . Anemia   . BPH (benign prostatic hyperplasia)   . Cataracts, bilateral   . CVA (cerebral vascular accident) (HCC)   . DVT (deep venous thrombosis) (HCC)    dvt in left leg  . Dyslipidemia   . Gout   . Gout   . HTN (hypertension)   . Left leg pain   . Osteoarthritis   . PAD (peripheral artery disease) (HCC)   . Stasis dermatitis   . Stroke (HCC)   . Vitamin D deficiency      Family History  Problem Relation Age of Onset  . Cancer Mother   . Cancer Father   . Alcohol abuse Father   . Breast cancer Daughter   . Stroke Daughter   . CVA Daughter      Current Outpatient Medications:  .  amLODipine (NORVASC) 5 MG  tablet, TAKE ONE TABLET BY MOUTH DAILY, Disp: 90 tablet, Rfl: 2 .  atorvastatin (LIPITOR) 80 MG tablet, TAKE 1 TABLET ONE TIME DAILY, Disp: 90 tablet, Rfl: 0 .  bisoprolol (ZEBETA) 5 MG tablet, TAKE 0.5 TABLETS (2.5 MG TOTAL) BY MOUTH DAILY., Disp: 60 tablet, Rfl: 2 .  chlorthalidone (HYGROTON) 25 MG tablet, Take 25 mg by mouth daily. , Disp: , Rfl:  .  Cholecalciferol (VITAMIN D3) 5000 units CAPS, Take 5,000 Units by mouth daily., Disp: , Rfl:  .  clopidogrel (PLAVIX) 75 MG tablet, Take 1 tablet (75 mg total) by mouth daily., Disp: 90 tablet, Rfl: 3 .  furosemide (LASIX) 20 MG tablet, TAKE ONE TABLET BY MOUTH DAILY, Disp: 90 tablet, Rfl: 0 .  gabapentin (NEURONTIN) 300 MG capsule, TAKE ONE CAPSULE BY MOUTH AT BEDTIME, Disp: 90 capsule, Rfl: 0 .  isosorbide-hydrALAZINE (BIDIL) 20-37.5 MG tablet, Take by mouth. , Disp: , Rfl:  .  ketorolac (ACULAR) 0.5 % ophthalmic solution, ketorolac 0.5 % eye drops, Disp: , Rfl:  .  Menthol-Methyl Salicylate (MUSCLE RUB) 10-15 % CREA, muscle rub, Disp: , Rfl:  .  methimazole (TAPAZOLE) 10 MG tablet, TAKE ONE TABLET BY MOUTH DAILY, Disp:  30 tablet, Rfl: 0 .  Multiple Vitamins-Minerals (COMPLETE SENIOR PO), 1 tablet daily. , Disp: , Rfl:  .  Omega-3 Fatty Acids (FISH OIL PO), Take 1,000 mg by mouth. , Disp: , Rfl:  .  tamsulosin (FLOMAX) 0.4 MG CAPS capsule, TAKE ONE CAPSULE BY MOUTH DAILY . TAKE HALF HOUR FOLLOWING THE SAME MEAL DAILY, Disp: 30 capsule, Rfl: 0 .  vitamin C (ASCORBIC ACID) 500 MG tablet, Take 500 mg daily by mouth., Disp: , Rfl:  .  Elastic Bandages & Supports (MEDICAL COMPRESSION STOCKINGS) MISC, , Disp: , Rfl:    Allergies  Allergen Reactions  . Shellfish Allergy Other (See Comments)    Gout      Review of Systems  Constitutional: Negative.   HENT: Positive for hearing loss.   Respiratory: Positive for shortness of breath.   Cardiovascular: Negative.  Negative for chest pain, palpitations and leg swelling.  Gastrointestinal: Negative.    Genitourinary: Negative.   Musculoskeletal: Negative.   Skin: Negative.   Neurological: Negative for dizziness and headaches.  Psychiatric/Behavioral: Negative.      Today's Vitals   02/03/20 1412  BP: 134/70  Pulse: (!) 58  Temp: 98.7 F (37.1 C)  TempSrc: Oral  Weight: 250 lb 6.4 oz (113.6 kg)  Height: 5' 8.8" (1.748 m)  PainSc: 7   PainLoc: Knee   Body mass index is 37.19 kg/m.   Objective:  Physical Exam Vitals reviewed.  Constitutional:      Appearance: Normal appearance. He is obese.  HENT:     Head: Normocephalic and atraumatic.     Right Ear: Tympanic membrane, ear canal and external ear normal. There is impacted cerumen (semi-firm cerumen to canal ).     Left Ear: Tympanic membrane, ear canal and external ear normal. There is impacted cerumen.     Nose:     Comments: Deferred - mask    Mouth/Throat:     Comments: Deferred - mask  Eyes:     Extraocular Movements: Extraocular movements intact.     Conjunctiva/sclera: Conjunctivae normal.     Pupils: Pupils are equal, round, and reactive to light.  Cardiovascular:     Rate and Rhythm: Normal rate and regular rhythm.     Pulses: Normal pulses.     Heart sounds: Normal heart sounds. No murmur.  Pulmonary:     Effort: Pulmonary effort is normal. No respiratory distress.     Breath sounds: Normal breath sounds.  Abdominal:     General: Abdomen is flat. Bowel sounds are normal. There is no distension.     Palpations: Abdomen is soft. There is no mass.     Comments: Central obesity  Genitourinary:    Prostate: Normal.     Rectum: Guaiac result negative.  Musculoskeletal:        General: Normal range of motion.     Cervical back: Normal range of motion and neck supple.  Skin:    General: Skin is warm.     Capillary Refill: Capillary refill takes less than 2 seconds.  Neurological:     General: No focal deficit present.     Mental Status: He is alert and oriented to person, place, and time.  Psychiatric:         Mood and Affect: Mood normal.        Behavior: Behavior normal.        Thought Content: Thought content normal.        Judgment: Judgment normal.  Assessment And Plan:     1. Health maintenance examination . Behavior modifications discussed and diet history reviewed.   . Pt will continue to exercise regularly and modify diet with low GI, plant based foods and decrease intake of processed foods.  . Recommend intake of daily multivitamin, Vitamin D, and calcium.  . Recommend for preventive screenings, as well as recommend immunizations that include influenza, TDAP   2. Essential hypertension  Chronic, fair control  Continue with current medications  3. Mixed hyperlipidemia  Chronic, stable.  Continue with current medications  4. Subclinical hyperthyroidism  Chronic, stable, no medications  5. Peripheral artery disease (HCC)  Chronic, being followed by Dr. Lilly Cove,  EKG done with Dr. Lilly Cove in September  6. Bilateral impacted cerumen  Water lavage done   Minette Brine, FNP    THE PATIENT IS ENCOURAGED TO PRACTICE SOCIAL DISTANCING DUE TO THE COVID-19 PANDEMIC.

## 2020-02-04 LAB — CMP14+EGFR
ALT: 17 IU/L (ref 0–44)
AST: 24 IU/L (ref 0–40)
Albumin/Globulin Ratio: 1.1 — ABNORMAL LOW (ref 1.2–2.2)
Albumin: 4 g/dL (ref 3.6–4.6)
Alkaline Phosphatase: 89 IU/L (ref 39–117)
BUN/Creatinine Ratio: 15 (ref 10–24)
BUN: 16 mg/dL (ref 8–27)
Bilirubin Total: 0.8 mg/dL (ref 0.0–1.2)
CO2: 23 mmol/L (ref 20–29)
Calcium: 9.3 mg/dL (ref 8.6–10.2)
Chloride: 106 mmol/L (ref 96–106)
Creatinine, Ser: 1.1 mg/dL (ref 0.76–1.27)
GFR calc Af Amer: 70 mL/min/{1.73_m2} (ref >59)
GFR calc non Af Amer: 61 mL/min/{1.73_m2} (ref >59)
Globulin, Total: 3.5 g/dL (ref 1.5–4.5)
Glucose: 81 mg/dL (ref 65–99)
Potassium: 4.1 mmol/L (ref 3.5–5.2)
Sodium: 142 mmol/L (ref 134–144)
Total Protein: 7.5 g/dL (ref 6.0–8.5)

## 2020-02-04 LAB — LIPID PANEL
Chol/HDL Ratio: 4.5 ratio (ref 0.0–5.0)
Cholesterol, Total: 140 mg/dL (ref 100–199)
HDL: 31 mg/dL — ABNORMAL LOW (ref >39)
LDL Chol Calc (NIH): 68 mg/dL (ref 0–99)
Triglycerides: 253 mg/dL — ABNORMAL HIGH (ref 0–149)
VLDL Cholesterol Cal: 41 mg/dL — ABNORMAL HIGH (ref 5–40)

## 2020-02-04 LAB — HEMOGLOBIN A1C
Est. average glucose Bld gHb Est-mCnc: 100 mg/dL
Hgb A1c MFr Bld: 5.1 % (ref 4.8–5.6)

## 2020-02-04 LAB — T3, FREE: T3, Free: 2.9 pg/mL (ref 2.0–4.4)

## 2020-02-04 LAB — T4: T4, Total: 6.7 ug/dL (ref 4.5–12.0)

## 2020-02-04 LAB — TSH: TSH: 8.58 u[IU]/mL — ABNORMAL HIGH (ref 0.450–4.500)

## 2020-02-07 ENCOUNTER — Other Ambulatory Visit: Payer: Self-pay

## 2020-02-07 DIAGNOSIS — I739 Peripheral vascular disease, unspecified: Secondary | ICD-10-CM

## 2020-02-07 DIAGNOSIS — E781 Pure hyperglyceridemia: Secondary | ICD-10-CM

## 2020-02-07 MED ORDER — CHLORTHALIDONE 25 MG PO TABS: 25.0000 mg | ORAL_TABLET | Freq: Every day | ORAL | 0 refills | Status: DC

## 2020-02-07 MED ORDER — TAMSULOSIN HCL 0.4 MG PO CAPS: ORAL_CAPSULE | ORAL | 0 refills | Status: DC

## 2020-02-07 MED ORDER — CLOPIDOGREL BISULFATE 75 MG PO TABS: 75.0000 mg | ORAL_TABLET | Freq: Every day | ORAL | 0 refills | Status: DC

## 2020-02-07 MED ORDER — METHIMAZOLE 10 MG PO TABS: 10.0000 mg | ORAL_TABLET | Freq: Every day | ORAL | 0 refills | Status: DC

## 2020-02-07 MED ORDER — FUROSEMIDE 20 MG PO TABS: 20.0000 mg | ORAL_TABLET | Freq: Every day | ORAL | 0 refills | Status: DC

## 2020-02-07 MED ORDER — AMLODIPINE BESYLATE 5 MG PO TABS: 5.0000 mg | ORAL_TABLET | Freq: Every day | ORAL | 0 refills | Status: DC

## 2020-02-07 MED ORDER — ISOSORB DINITRATE-HYDRALAZINE 20-37.5 MG PO TABS: ORAL_TABLET | ORAL | 0 refills | Status: DC

## 2020-02-07 MED ORDER — BISOPROLOL FUMARATE 5 MG PO TABS: 2.5000 mg | ORAL_TABLET | Freq: Every day | ORAL | 0 refills | Status: DC

## 2020-02-07 MED ORDER — ATORVASTATIN CALCIUM 80 MG PO TABS: ORAL_TABLET | ORAL | 0 refills | Status: DC

## 2020-02-07 NOTE — Telephone Encounter (Signed)
The pt's daughter called and said that the pt forgot his medications and that he is out of town and needed about 5 days of his prescriptions sent to the pharmacy.  The prescriptions were faxed/

## 2020-02-18 ENCOUNTER — Encounter: Payer: Self-pay | Admitting: Cardiology

## 2020-02-18 ENCOUNTER — Other Ambulatory Visit: Payer: Self-pay

## 2020-02-18 ENCOUNTER — Ambulatory Visit: Payer: Medicare HMO | Admitting: Cardiology

## 2020-02-18 VITALS — BP 112/63 | HR 52 | Temp 96.7°F | Ht 68.8 in | Wt 243.0 lb

## 2020-02-18 DIAGNOSIS — I739 Peripheral vascular disease, unspecified: Secondary | ICD-10-CM | POA: Diagnosis not present

## 2020-02-18 DIAGNOSIS — I440 Atrioventricular block, first degree: Secondary | ICD-10-CM

## 2020-02-18 DIAGNOSIS — E782 Mixed hyperlipidemia: Secondary | ICD-10-CM | POA: Diagnosis not present

## 2020-02-18 DIAGNOSIS — E781 Pure hyperglyceridemia: Secondary | ICD-10-CM | POA: Diagnosis not present

## 2020-02-18 MED ORDER — ICOSAPENT ETHYL 1 G PO CAPS: 1.0000 g | ORAL_CAPSULE | Freq: Two times a day (BID) | ORAL | 2 refills | Status: DC

## 2020-02-18 NOTE — Patient Instructions (Signed)
Stop Zebeta Start Vascepa 1 g twice a day Fasting bloodwork in June Follow up with me in July Please check with your primary doctor regarding your thyroid function

## 2020-02-18 NOTE — Progress Notes (Signed)
Subjective:   Jesus Lam, male    DOB: 02-19-1933, 86 y.o.   MRN: [id number]   Chief complaint:  PAD   HPI  86 year old African-American male with severe bilateral PAD with class III claudication, now s/p successful DCB Rt SFA and 2 overlapping Zilver PTX stents placed left SFA (12/2017), s/p left temporal occipital cortical infarct (05/2018).  Patient has missed swimming ever since the pandemic started. He complains of left hip pain, and left leg pain, with exertion. He denies chest pain, shortness of breath, palpitations, leg edema, orthopnea, PND, TIA/syncope.  Reviewed recent labs with the patient. TSH is elevated. HR is in 36s.   Current Outpatient Medications on File Prior to Visit  Medication Sig Dispense Refill  . amLODipine (NORVASC) 5 MG tablet Take 1 tablet (5 mg total) by mouth daily. 5 tablet 0  . atorvastatin (LIPITOR) 80 MG tablet TAKE 1 TABLET ONE TIME DAILY 5 tablet 0  . bisoprolol (ZEBETA) 5 MG tablet Take 0.5 tablets (2.5 mg total) by mouth daily. 5 tablet 0  . chlorthalidone (HYGROTON) 25 MG tablet Take 1 tablet (25 mg total) by mouth daily. 5 tablet 0  . Cholecalciferol (VITAMIN D3) 5000 units CAPS Take 5,000 Units by mouth daily.    . clopidogrel (PLAVIX) 75 MG tablet Take 1 tablet (75 mg total) by mouth daily. 5 tablet 0  . furosemide (LASIX) 20 MG tablet Take 1 tablet (20 mg total) by mouth daily. 5 tablet 0  . gabapentin (NEURONTIN) 300 MG capsule TAKE ONE CAPSULE BY MOUTH AT BEDTIME 90 capsule 0  . isosorbide-hydrALAZINE (BIDIL) 20-37.5 MG tablet Take 1 tablet by mouth daily 5 tablet 0  . ketorolac (ACULAR) 0.5 % ophthalmic solution ketorolac 0.5 % eye drops    . Menthol-Methyl Salicylate (MUSCLE RUB) 10-15 % CREA muscle rub    . methimazole (TAPAZOLE) 10 MG tablet Take 1 tablet (10 mg total) by mouth daily. 5 tablet 0  . Multiple Vitamins-Minerals (COMPLETE SENIOR PO) 1 tablet daily.     . Omega-3 Fatty Acids (FISH OIL PO) Take 1,000 mg by mouth.      . tamsulosin (FLOMAX) 0.4 MG CAPS capsule TAKE ONE CAPSULE BY MOUTH DAILY . TAKE HALF HOUR FOLLOWING THE SAME MEAL DAILY 5 capsule 0  . vitamin C (ASCORBIC ACID) 500 MG tablet Take 500 mg daily by mouth.    Water engineer Bandages & Supports (MEDICAL COMPRESSION STOCKINGS) MISC      No current facility-administered medications on file prior to visit.    Cardiovascular studies:  EKG 02/18/2020: Sinus rhythm 51 bpm First degree A-V block  Left anterior fascicular block.   ABI 02/05/2018: This exam reveals moderately decreased perfusion of the lower extremity, noted at  the post tibial artery level (ABI). Right ABI 0.79 and left ABI 0.71. Compared to  11/19/2017:   LABI 0.67 and RABI 0.59.  Periperal angiography 01/27/2018:  Right mid and distal SFA CTO, moderate calcification evident. Successful PTA and stenting of the CTO right mid and distal SFA with antegrade left femoral and retrograde right PT access and implantation of 2 overlapping 6.0 x 1 20 mm Zilver PTX drug-coated stents. Stenosis reduced from 100% to 0%. Brisk flow was evident at the end of the procedure..  Echocardiogram 07/24/2018: 1. Left ventricle cavity is normal in size. Mild concentric hypertrophy of the left ventricle. Doppler evidence of grade I (impaired) diastolic dysfunction, normal LAP. Left ventricle regional wall motion findings: Basal and mid inferior hypokinesis. Calculated EF 46%.  2. Moderate aortic valve leaflet thickening. Mildly restricted aortic valve leaflets. No evidence of aortic valve stenosis. Mild (Grade I) aortic regurgitation. 3. Structurally normal mitral valve with mild (Grade I) regurgitation. 4. Compared to the study done on 09/20/2017, no significant change in LVEF. Wall motion appears to be new. Previously mitral regurgitation was mild to moderate.   Recent labs: 02/03/2020: Glucose 81, BUN/Cr 16/1.1. EGFR 70. Na/K 142/4.1. Rest of the CMP normal HbA1C 5.1% Chol 140, TG 253, HDL 31, LDL 68 TSH  8.5, elevated  12/29/2019: H/H 11.7/36.4. MCV 92. Platelets 194 Chol 108, TG 143, HDL 32, LDL 51   Review of Systems  Cardiovascular: Positive for claudication (Left leg). Negative for chest pain, dyspnea on exertion, leg swelling, palpitations and syncope.  Musculoskeletal: Positive for joint pain.       Vitals:   02/18/20 0821  BP: 112/63  Pulse: (!) 52  Temp: (!) 96.7 F (35.9 C)  SpO2: 93%     Body mass index is 36.09 kg/m. Filed Weights   02/18/20 0821  Weight: 243 lb (110.2 kg)      Objective:    Physical Exam  Constitutional: He appears well-developed and well-nourished.  Neck: No JVD present.  Cardiovascular: Normal rate, regular rhythm and normal heart sounds. Exam reveals decreased pulses.  No murmur heard. Pulses:      Femoral pulses are 3+ on the right side and 3+ on the left side.      Popliteal pulses are 1+ on the right side and 1+ on the left side.       Dorsalis pedis pulses are 0 on the right side and 0 on the left side.       Posterior tibial pulses are 0 on the right side and 0 on the left side.  Pulmonary/Chest: Effort normal and breath sounds normal. He has no wheezes. He has no rales.  Musculoskeletal:        General: No edema.  Nursing note and vitals reviewed.         Assessment & Recommendations:   86 year old African-American male with severe bilateral PAD with class III claudication, now s/p successful DCB Rt SFA and 2 overlapping Zilver PTX stents placed left SFA (12/2017), s/p left temporal occipital cortical infarct (05/2018).  Leg pain: His left hip pain is unlikely to be due to claudication, as his femoral pulses are normal, I suspect this is due to arthritis. His left calf pain is likely due to claudication. However, it is not lifestlye limiting. He does not have any signs of critical limb ischemia. Continue medical therapy including plavix, statin. Encouraged regular walking. If symptoms persist in spite of the above, could  consider LE US duplex, LE angiography and revascularization.  Hypertension: Fairly well controlled.  Stopped bisoprolol due to resting bradycardia. Continue rest of the current antihypertensive therapy.   Hypertriglyceridemia: Remains elevated. Started Vascepa 1 g bid. While he has shellfish therapy, he is able to tolerate over the counter fish oil without any adverse side effects.  ontinue Lipitor 40 mg daily.  Follow-up in 3 months.  Nigel Mormon, MD [facility] Pager: [phone] Office: [phone] If no answer Cell [phone]

## 2020-02-21 ENCOUNTER — Other Ambulatory Visit: Payer: Self-pay | Admitting: Internal Medicine

## 2020-02-25 ENCOUNTER — Telehealth: Payer: Self-pay

## 2020-03-02 ENCOUNTER — Other Ambulatory Visit: Payer: Self-pay | Admitting: Nurse Practitioner

## 2020-03-07 ENCOUNTER — Other Ambulatory Visit: Payer: Self-pay | Admitting: Nurse Practitioner

## 2020-03-11 ENCOUNTER — Other Ambulatory Visit: Payer: Self-pay | Admitting: Nurse Practitioner

## 2020-03-11 DIAGNOSIS — I10 Essential (primary) hypertension: Secondary | ICD-10-CM

## 2020-03-13 ENCOUNTER — Telehealth: Payer: Self-pay

## 2020-03-13 ENCOUNTER — Ambulatory Visit (INDEPENDENT_AMBULATORY_CARE_PROVIDER_SITE_OTHER): Payer: Medicare HMO

## 2020-03-13 ENCOUNTER — Other Ambulatory Visit: Payer: Self-pay

## 2020-03-13 DIAGNOSIS — E782 Mixed hyperlipidemia: Secondary | ICD-10-CM

## 2020-03-13 DIAGNOSIS — I10 Essential (primary) hypertension: Secondary | ICD-10-CM | POA: Diagnosis not present

## 2020-03-13 DIAGNOSIS — E0590 Thyrotoxicosis, unspecified without thyrotoxic crisis or storm: Secondary | ICD-10-CM

## 2020-03-13 DIAGNOSIS — R6889 Other general symptoms and signs: Secondary | ICD-10-CM | POA: Diagnosis not present

## 2020-03-13 DIAGNOSIS — M79605 Pain in left leg: Secondary | ICD-10-CM

## 2020-03-14 NOTE — Chronic Care Management (AMB) (Signed)
Chronic Care Management   Follow Up Note   03/14/2020 Name: Jesus Lam MRN: [id number] DOB: 1933/02/01  Referred by: Minette Brine, FNP Reason for referral : Chronic Care Management (FU RN CM Call )   Jesus Lam is a 86 y.o. year old male who is a primary care patient of Minette Brine, [place]. The CCM team was consulted for assistance with chronic disease management and care coordination needs.    Review of patient status, including review of consultants reports, relevant laboratory and other test results, and collaboration with appropriate care team members and the patient's provider was performed as part of comprehensive patient evaluation and provision of chronic care management services.    SDOH (Social Determinants of Health) assessments performed: Yes - no SDOH needs identified at this time  See Care Plan activities for detailed interventions related to [name]   Placed outbound CCM RN CM call to patient for a CCM update.     Outpatient Encounter Medications as of 03/13/2020  Medication Sig  . amLODipine (NORVASC) 5 MG tablet Take 1 tablet (5 mg total) by mouth daily.  Marland Kitchen atorvastatin (LIPITOR) 80 MG tablet TAKE 1 TABLET ONE TIME DAILY  . chlorthalidone (HYGROTON) 25 MG tablet Take 1 tablet (25 mg total) by mouth daily.  . Cholecalciferol (VITAMIN D3) 5000 units CAPS Take 5,000 Units by mouth daily.  . clopidogrel (PLAVIX) 75 MG tablet Take 1 tablet (75 mg total) by mouth daily.  Regino Schultze Bandages & Supports (MEDICAL COMPRESSION STOCKINGS) MISC   . furosemide (LASIX) 20 MG tablet Take 1 tablet (20 mg total) by mouth daily.  Marland Kitchen gabapentin (NEURONTIN) 300 MG capsule TAKE ONE CAPSULE BY MOUTH AT BEDTIME  . icosapent Ethyl (VASCEPA) 1 g capsule Take 1 capsule (1 g total) by mouth 2 (two) times daily.  . isosorbide-hydrALAZINE (BIDIL) 20-37.5 MG tablet Take 1 tablet by mouth daily  . ketorolac (ACULAR) 0.5 % ophthalmic solution ketorolac 0.5 % eye drops  . Menthol-Methyl  Salicylate (MUSCLE RUB) 10-15 % CREA muscle rub  . methimazole (TAPAZOLE) 10 MG tablet Take 1 tablet (10 mg total) by mouth daily.  . Multiple Vitamins-Minerals (COMPLETE SENIOR PO) 1 tablet daily.   . Omega-3 Fatty Acids (FISH OIL PO) Take 1,000 mg by mouth.   . tamsulosin (FLOMAX) 0.4 MG CAPS capsule TAKE ONE CAPSULE BY MOUTH DAILY . TAKE HALF HOUR FOLLOWING THE SAME MEAL DAILY  . vitamin C (ASCORBIC ACID) 500 MG tablet Take 500 mg daily by mouth.   No facility-administered encounter medications on file as of 03/13/2020.     Objective:  Lab Results  Component Value Date   HGBA1C 5.1 02/03/2020   HGBA1C 5.3 06/13/2018   Lab Results  Component Value Date   MICROALBUR 30 02/03/2020   LDLCALC 68 02/03/2020   CREATININE 1.10 02/03/2020   BP Readings from Last 3 Encounters:  02/18/20 112/63  02/03/20 134/70  02/03/20 134/70    Goals Addressed      Patient Stated   . "I am having some knee pain" (pt-stated)       Current Barriers:  Marland Kitchen Knowledge Deficits related to evaluation and treatment of bilateral knee pain  . Chronic Disease Management support and education needs related to HTN, Hyperthyroidism, Altered memory,  Bilateral hand pain, Medication management  Nurse Case Manager Clinical Goal(s):  Marland Kitchen Over the next 30 days, patient will verbalize understanding of plan for treatment of his knee pain  Goal Met . Over the next 90 days, patient will report  having decreased pain and improved ambulation w/o falls Goal Met . New 03/14/20 Over the next 30 days, patient will follow up with PCP for evaluation and treatment of reoccurring Left knee pain and swelling . New 03/14/20 Over the next 90 days, patient will verbalize and demonstrate improved ambulation and ROM as evidence by patient will be able to participate in water aerobics and perform self care with ease  CCM RN CM Interventions:  03/14/20 call completed with patient  . Evaluation of current treatment plan related to left knee  pain and patient's adherence to plan as established by provider. . Reinforced with patient importance of using DME such as cane or rollator at all times and to wear good supportives shoes with ambulation to help avoid falls; avoid walking on uneven ground; encouraged to remove scatter rugs from his home and to keep the floors clutter free . Determined patient has recently resumed water aerobics three times weekly . Determined currently, patient is having increased pain and swelling to his Left knee and would like to have this further evaluated by PCP ASAP . Determined patient is no longer established with Orthopedics and was advised he will need a new referral . Discussed plans with patient for ongoing care management follow up and provided patient with direct contact information for care management team . Scheduled patient to f/u with PCP provider Minette Brine, FNP tomorrow am at 11:45 AM   Patient Self Care Activities:  . Self administers medications as prescribed . Attends all scheduled provider appointments . Calls pharmacy for medication refills . Performs ADL's independently . Performs IADL's independently . Calls provider office for new concerns or questions  Please see past updates related to this goal by clicking on the "Past Updates" button in the selected goal     . "I would like to learn what foods make my Triglycerides too high" (pt-stated)       CARE PLAN ENTRY (see longitudinal plan of care for additional care plan information)  Current Barriers:  Marland Kitchen Knowledge Deficits related to disease process and Self Health management of high Triglycerides . Chronic Disease Management support and education needs related to HTN, Hyperthyroidism, Altered memory,  Bilateral hand pain, Medication management  Nurse Case Manager Clinical Goal(s):  Marland Kitchen Over the next 90 days, patient will work with CCM RN CM and PCP to address needs related to disease education and support to help lower  Triglycerides  Interventions:  . Inter-disciplinary care team collaboration (see longitudinal plan of care) . Evaluation of current treatment plan related to elevated Triglycerides and patient's adherence to plan as established by provider. . Provided education to patient re: causes for elevated Triglycerides; Educated on what foods to eat and what foods to avoid to help lower Triglycerides; Education provided related to other ways to Self manage by adding routine exercise to daily routine . Reviewed medications with patient and discussed patient continues to take his cholesterol medication exactly as prescribed . Discussed plans with patient for ongoing care management follow up and provided patient with direct contact information for care management team . Provided patient with printed educational materials related to Triglycerides  Patient Self Care Activities:  . Self administers medications as prescribed . Attends all scheduled provider appointments . Calls pharmacy for medication refills . Performs ADL's independently . Performs IADL's independently . Calls provider office for new concerns or questions  Initial goal documentation       Other   . "Abnormal TSH"  CARE PLAN ENTRY (see longitudinal plan of care for additional care plan information)  Current Barriers:  Marland Kitchen Knowledge Deficits related to disease process and Self Health management of Abnormal TSH . Chronic Disease Management support and education needs related to HTN, Hyperthyroidism, Altered memory,  Bilateral hand pain, Medication management  Nurse Case Manager Clinical Goal(s):  Marland Kitchen Over the next 30 days, patient will work with CCM RN CM and PCP to address needs related to disease education and support for increased knowledge and understanding on how to improve Self Health management of thyroid dysfunction . Over the next 90 days, patient will verbalize increased understanding of how to manage/treat his abnormal  TSH as evidence by patient will have a normal TSH level  CCM RN CM Interventions:  03/14/20 call completed with patient  . Inter-disciplinary care team collaboration (see longitudinal plan of care) . Evaluation of current treatment plan related to thyroid dysfunction and Abnormal TSH and patient's adherence to plan as established by provider. . Discussed and reviewed with patient his most recent TSH is abnormal; Determined patient was prescribed thyroid medication in the past but was advised to stop taking it . Collaborated with PCP provider Minette Brine, FNP regarding patient is no longer taking thyroid medication, advised Mr. Gladson has an [place] with her tomorrow, 03/15/20 for evaluation of his Left knee and would like to discuss recommendations for abnormal TSH . Discussed plans with patient for ongoing care management follow up and provided patient with direct contact information for care management team  Patient Self Care Activities:  . Self administers medications as prescribed . Attends all scheduled provider appointments . Calls pharmacy for medication refills . Performs ADL's independently . Performs IADL's independently . Calls provider office for new concerns or questions  Initial goal documentation     . COMPLETED: "Needs Nephrology referral for elevated creatinine"        Per provider, Audery Amel, PA-C  Current Barriers:  Marland Kitchen Knowledge Deficits related to Chronic Kidney Disease process and treatment  Nurse Case Manager Clinical Goal(s):  Marland Kitchen Over the next 30 days, patient/daughter will verbalize basic understanding of Chronic Kidney disease process and self health management plan as evidenced by patient and daughter [name] will verbalize increased knowledge and understanding of importance of patient following up with a Nephrologist to evaluate and treat patient's Chronic Kidney disease. Goal Complete . Over the next 30 days, patient will have a referral in place to follow up  with a Nephrologist to evaluate and treat elevated serum creatinine. Goal Complete  CCM RN CM Interventions:  03/14/20 call completed with patient . Discussed and determined patient's renal function has improved - Determined Nephrology follow up is no longer needed at this time . Reviewed and discussed with patient recent BUN/Creatinine is 16/1.10 with GFR at 70 . Determined previous renal US is within normal limits;  IMPRESSION: 1. No evidence of renal artery stenosis. 2. Mild increased echogenicity of the bilateral renal parenchyma with suspected mild left-sided cortical thinning as could be seen in the setting of provided history of chronic kidney disease. No evidence of urinary obstruction. 3. Incidentally noted bilateral renal cysts. 4. The prostate results in mass effect on the undersurface of an otherwise normal-appearing urinary bladder. If not recently performed, further evaluation with DRE is advised.  Patient Self Care Activities:   Daughter Lanier Ensign understanding of instructions/education provided today  . Self administers medications as prescribed . Attends all scheduled provider appointments . Performs ADL's independently . Calls provider office  for new concerns or questions  Please see past updates related to this goal by clicking on the "Past Updates" button in the selected goal        Plan:   The care management team will reach out to the patient again over the next 30 days.   Barb Merino, RN, BSN, CCM Care Management Coordinator [place] [facility]  Direct Phone: [phone]

## 2020-03-14 NOTE — Patient Instructions (Signed)
Visit Information  Goals Addressed      Patient Stated   . "I am having some knee pain" (pt-stated)       Current Barriers:  Marland Kitchen Knowledge Deficits related to evaluation and treatment of bilateral knee pain  . Chronic Disease Management support and education needs related to HTN, Hyperthyroidism, Altered memory,  Bilateral hand pain, Medication management  Nurse Case Manager Clinical Goal(s):  Marland Kitchen Over the next 30 days, patient will verbalize understanding of plan for treatment of his knee pain  Goal Met . Over the next 90 days, patient will report having decreased pain and improved ambulation w/o falls Goal Met . New 03/14/20 Over the next 30 days, patient will follow up with PCP for evaluation and treatment of reoccurring Left knee pain and swelling . New 03/14/20 Over the next 90 days, patient will verbalize and demonstrate improved ambulation and ROM as evidence by patient will be able to participate in water aerobics and perform self care with ease  CCM RN CM Interventions:  03/14/20 call completed with patient  . Evaluation of current treatment plan related to left knee pain and patient's adherence to plan as established by provider. . Reinforced with patient importance of using DME such as cane or rollator at all times and to wear good supportives shoes with ambulation to help avoid falls; avoid walking on uneven ground; encouraged to remove scatter rugs from his home and to keep the floors clutter free . Determined patient has recently resumed water aerobics three times weekly . Determined currently, patient is having increased pain and swelling to his Left knee and would like to have this further evaluated by PCP ASAP . Determined patient is no longer established with Orthopedics and was advised he will need a new referral . Discussed plans with patient for ongoing care management follow up and provided patient with direct contact information for care management team . Scheduled patient  to f/u with PCP provider Minette Brine, FNP tomorrow am at 11:45 AM   Patient Self Care Activities:  . Self administers medications as prescribed . Attends all scheduled provider appointments . Calls pharmacy for medication refills . Performs ADL's independently . Performs IADL's independently . Calls provider office for new concerns or questions  Please see past updates related to this goal by clicking on the "Past Updates" button in the selected goal     . "I would like to learn what foods make my Triglycerides too high" (pt-stated)       CARE PLAN ENTRY (see longitudinal plan of care for additional care plan information)  Current Barriers:  Marland Kitchen Knowledge Deficits related to disease process and Self Health management of high Triglycerides . Chronic Disease Management support and education needs related to HTN, Hyperthyroidism, Altered memory,  Bilateral hand pain, Medication management  Nurse Case Manager Clinical Goal(s):  Marland Kitchen Over the next 90 days, patient will work with CCM RN CM and PCP to address needs related to disease education and support to help lower Triglycerides  Interventions:  . Inter-disciplinary care team collaboration (see longitudinal plan of care) . Evaluation of current treatment plan related to elevated Triglycerides and patient's adherence to plan as established by provider. . Provided education to patient re: causes for elevated Triglycerides; Educated on what foods to eat and what foods to avoid to help lower Triglycerides; Education provided related to other ways to Self manage by adding routine exercise to daily routine . Reviewed medications with patient and discussed patient continues to take  his cholesterol medication exactly as prescribed . Discussed plans with patient for ongoing care management follow up and provided patient with direct contact information for care management team . Provided patient with printed educational materials related to  Triglycerides  Patient Self Care Activities:  . Self administers medications as prescribed . Attends all scheduled provider appointments . Calls pharmacy for medication refills . Performs ADL's independently . Performs IADL's independently . Calls provider office for new concerns or questions  Initial goal documentation       Other   . "Abnormal TSH"       CARE PLAN ENTRY (see longitudinal plan of care for additional care plan information)  Current Barriers:  Marland Kitchen Knowledge Deficits related to disease process and Self Health management of Abnormal TSH . Chronic Disease Management support and education needs related to HTN, Hyperthyroidism, Altered memory,  Bilateral hand pain, Medication management  Nurse Case Manager Clinical Goal(s):  Marland Kitchen Over the next 30 days, patient will work with CCM RN CM and PCP to address needs related to disease education and support for increased knowledge and understanding on how to improve Self Health management of thyroid dysfunction . Over the next 90 days, patient will verbalize increased understanding of how to manage/treat his abnormal TSH as evidence by patient will have a normal TSH level  CCM RN CM Interventions:  03/14/20 call completed with patient  . Inter-disciplinary care team collaboration (see longitudinal plan of care) . Evaluation of current treatment plan related to thyroid dysfunction and Abnormal TSH and patient's adherence to plan as established by provider. . Discussed and reviewed with patient his most recent TSH is abnormal; Determined patient was prescribed thyroid medication in the past but was advised to stop taking it . Collaborated with PCP provider Minette Brine, FNP regarding patient is no longer taking thyroid medication, advised Mr. Heppler has an [place] with her tomorrow, 03/15/20 for evaluation of his Left knee and would like to discuss recommendations for abnormal TSH . Discussed plans with patient for ongoing care management  follow up and provided patient with direct contact information for care management team  Patient Self Care Activities:  . Self administers medications as prescribed . Attends all scheduled provider appointments . Calls pharmacy for medication refills . Performs ADL's independently . Performs IADL's independently . Calls provider office for new concerns or questions  Initial goal documentation    . COMPLETED: "Needs Nephrology referral for elevated creatinine"        Per provider, Audery Amel, PA-C  Current Barriers:  Marland Kitchen Knowledge Deficits related to Chronic Kidney Disease process and treatment  Nurse Case Manager Clinical Goal(s):  Marland Kitchen Over the next 30 days, patient/daughter will verbalize basic understanding of Chronic Kidney disease process and self health management plan as evidenced by patient and daughter [name] will verbalize increased knowledge and understanding of importance of patient following up with a Nephrologist to evaluate and treat patient's Chronic Kidney disease. Goal Complete . Over the next 30 days, patient will have a referral in place to follow up with a Nephrologist to evaluate and treat elevated serum creatinine. Goal Complete  CCM RN CM Interventions:  03/14/20 call completed with patient . Discussed and determined patient's renal function has improved - Determined Nephrology follow up is no longer needed at this time . Reviewed and discussed with patient recent BUN/Creatinine is 16/1.10 with GFR at 70 . Determined previous renal US is within normal limits;  IMPRESSION: 1. No evidence of renal artery stenosis. 2. Mild  increased echogenicity of the bilateral renal parenchyma with suspected mild left-sided cortical thinning as could be seen in the setting of provided history of chronic kidney disease. No evidence of urinary obstruction. 3. Incidentally noted bilateral renal cysts. 4. The prostate results in mass effect on the undersurface of an otherwise  normal-appearing urinary bladder. If not recently performed, further evaluation with DRE is advised.  Patient Self Care Activities:   Daughter Lanier Ensign understanding of instructions/education provided today  . Self administers medications as prescribed . Attends all scheduled provider appointments . Performs ADL's independently . Calls provider office for new concerns or questions  Please see past updates related to this goal by clicking on the "Past Updates" button in the selected goal        Patient verbalizes understanding of instructions provided today.   The care management team will reach out to the patient again over the next 30 days.   Barb Merino, RN, BSN, CCM Care Management Coordinator [place] [facility]  Direct Phone: [phone]

## 2020-03-15 ENCOUNTER — Encounter: Payer: Self-pay | Admitting: Nurse Practitioner

## 2020-03-15 ENCOUNTER — Ambulatory Visit (INDEPENDENT_AMBULATORY_CARE_PROVIDER_SITE_OTHER): Payer: Medicare HMO | Admitting: Nurse Practitioner

## 2020-03-15 ENCOUNTER — Other Ambulatory Visit: Payer: Self-pay

## 2020-03-15 VITALS — BP 134/72 | HR 71 | Temp 98.3°F | Ht 68.8 in | Wt 243.6 lb

## 2020-03-15 DIAGNOSIS — M25552 Pain in left hip: Secondary | ICD-10-CM | POA: Diagnosis not present

## 2020-03-15 DIAGNOSIS — M25562 Pain in left knee: Secondary | ICD-10-CM

## 2020-03-15 DIAGNOSIS — E0590 Thyrotoxicosis, unspecified without thyrotoxic crisis or storm: Secondary | ICD-10-CM

## 2020-03-15 DIAGNOSIS — G8929 Other chronic pain: Secondary | ICD-10-CM

## 2020-03-15 HISTORY — DX: Pain in left hip: M25.552

## 2020-03-15 MED ORDER — DICLOFENAC SODIUM 1 % EX GEL: 2.0000 g | Freq: Four times a day (QID) | CUTANEOUS | 2 refills | Status: DC

## 2020-03-15 MED ORDER — METHIMAZOLE 10 MG PO TABS: 10.0000 mg | ORAL_TABLET | Freq: Every day | ORAL | 1 refills | Status: DC

## 2020-03-15 MED ORDER — TRIAMCINOLONE ACETONIDE 40 MG/ML IJ SUSP: 40.0000 mg | Freq: Once | INTRAMUSCULAR | Status: DC

## 2020-03-15 MED ORDER — KETOROLAC TROMETHAMINE 60 MG/2ML IM SOLN
60.0000 mg | Freq: Once | INTRAMUSCULAR | Status: AC
  Administered 2020-03-15: 60 mg via INTRAMUSCULAR

## 2020-03-15 NOTE — Progress Notes (Signed)
This visit occurred during the SARS-CoV-2 public health emergency.  Safety protocols were in place, including screening questions prior to the visit, additional usage of staff PPE, and extensive cleaning of exam room while observing appropriate contact time as indicated for disinfecting solutions.  Subjective:     Patient ID: Jesus Lam , male    DOB: [birth year] , 86 y.o.   MRN: [id number]   Chief Complaint  Patient presents with  . Knee Pain    (L) thigh are on backside pain    HPI  History of claudication - 2020 - 2 stents.   He has been seen at [facility] for about 3 years, has had fluid removed. Reports arthritis   Knee Pain  The incident occurred more than 1 week ago. The pain is present in the left knee and left hip. The quality of the pain is described as aching. The pain is at a severity of 7/10. The pain is moderate. The pain has been constant since onset. Pertinent negatives include no inability to bear weight. He has tried acetaminophen and NSAIDs (pain cream) for the symptoms.     Past Medical History:  Diagnosis Date  . Anemia   . BPH (benign prostatic hyperplasia)   . Cataracts, bilateral   . CVA (cerebral vascular accident) (HCC)   . DVT (deep venous thrombosis) (HCC)    dvt in left leg  . Dyslipidemia   . Gout   . Gout   . HTN (hypertension)   . Left leg pain   . Osteoarthritis   . PAD (peripheral artery disease) (HCC)   . Stasis dermatitis   . Stroke (HCC)   . Vitamin D deficiency      Family History  Problem Relation Age of Onset  . Cancer Mother   . Cancer Father   . Alcohol abuse Father   . Breast cancer Daughter   . Stroke Daughter   . CVA Daughter      Current Outpatient Medications:  .  amLODipine (NORVASC) 5 MG tablet, Take 1 tablet (5 mg total) by mouth daily., Disp: 5 tablet, Rfl: 0 .  atorvastatin (LIPITOR) 80 MG tablet, TAKE 1 TABLET ONE TIME DAILY, Disp: 5 tablet, Rfl: 0 .  chlorthalidone (HYGROTON) 25 MG tablet, Take 1 tablet  (25 mg total) by mouth daily., Disp: 5 tablet, Rfl: 0 .  Cholecalciferol (VITAMIN D3) 5000 units CAPS, Take 5,000 Units by mouth daily., Disp: , Rfl:  .  clopidogrel (PLAVIX) 75 MG tablet, Take 1 tablet (75 mg total) by mouth daily., Disp: 5 tablet, Rfl: 0 .  Elastic Bandages & Supports (MEDICAL COMPRESSION STOCKINGS) MISC, , Disp: , Rfl:  .  furosemide (LASIX) 20 MG tablet, Take 1 tablet (20 mg total) by mouth daily., Disp: 5 tablet, Rfl: 0 .  gabapentin (NEURONTIN) 300 MG capsule, TAKE ONE CAPSULE BY MOUTH AT BEDTIME, Disp: 90 capsule, Rfl: 0 .  icosapent Ethyl (VASCEPA) 1 g capsule, Take 1 capsule (1 g total) by mouth 2 (two) times daily., Disp: 60 capsule, Rfl: 2 .  isosorbide-hydrALAZINE (BIDIL) 20-37.5 MG tablet, Take 1 tablet by mouth daily, Disp: 5 tablet, Rfl: 0 .  ketorolac (ACULAR) 0.5 % ophthalmic solution, ketorolac 0.5 % eye drops, Disp: , Rfl:  .  Menthol-Methyl Salicylate (MUSCLE RUB) 10-15 % CREA, muscle rub, Disp: , Rfl:  .  methimazole (TAPAZOLE) 10 MG tablet, Take 1 tablet (10 mg total) by mouth daily., Disp: 5 tablet, Rfl: 0 .  Multiple Vitamins-Minerals (COMPLETE SENIOR PO), 1  tablet daily. , Disp: , Rfl:  .  Omega-3 Fatty Acids (FISH OIL PO), Take 1,000 mg by mouth. , Disp: , Rfl:  .  tamsulosin (FLOMAX) 0.4 MG CAPS capsule, TAKE ONE CAPSULE BY MOUTH DAILY . TAKE HALF HOUR FOLLOWING THE SAME MEAL DAILY, Disp: 90 capsule, Rfl: 1 .  vitamin C (ASCORBIC ACID) 500 MG tablet, Take 500 mg daily by mouth., Disp: , Rfl:    Allergies  Allergen Reactions  . Shellfish Allergy Other (See Comments)    Gout      Review of Systems  Respiratory: Negative.   Cardiovascular: Negative.  Negative for chest pain, palpitations and leg swelling.  Musculoskeletal:       Left knee and left hip pain   Neurological: Negative for dizziness and headaches.  Psychiatric/Behavioral: Negative.      Today's Vitals   03/15/20 1109  BP: 134/72  Pulse: 71  Temp: 98.3 F (36.8 C)  TempSrc: Oral   SpO2: 92%  Weight: 243 lb 9.6 oz (110.5 kg)  Height: 5' 8.8" (1.748 m)   Body mass index is 36.18 kg/m.   Objective:  Physical Exam Constitutional:      General: He is not in acute distress.    Appearance: Normal appearance.  Cardiovascular:     Rate and Rhythm: Normal rate and regular rhythm.     Pulses: Normal pulses.     Heart sounds: Normal heart sounds. No murmur.  Pulmonary:     Effort: Pulmonary effort is normal. No respiratory distress.     Breath sounds: Normal breath sounds.  Musculoskeletal:        General: No tenderness or deformity. Normal range of motion.  Skin:    General: Skin is warm.  Neurological:     General: No focal deficit present.     Mental Status: He is alert and oriented to person, place, and time.     Cranial Nerves: No cranial nerve deficit.  Psychiatric:        Mood and Affect: Mood normal.        Behavior: Behavior normal.        Thought Content: Thought content normal.        Judgment: Judgment normal.         Assessment And Plan:     1. Chronic pain of left knee He is having recurrent pain to the knee Negative straight leg or drawer test Will treat with toradol and kenalog in office  2. Left hip pain  Tenderness on palpation, likely arthritis related   Minette Brine, FNP    THE PATIENT IS ENCOURAGED TO PRACTICE SOCIAL DISTANCING DUE TO THE COVID-19 PANDEMIC.

## 2020-04-11 ENCOUNTER — Telehealth: Payer: Self-pay

## 2020-04-26 ENCOUNTER — Other Ambulatory Visit: Payer: Self-pay | Admitting: Nurse Practitioner

## 2020-04-26 DIAGNOSIS — E0590 Thyrotoxicosis, unspecified without thyrotoxic crisis or storm: Secondary | ICD-10-CM

## 2020-04-27 ENCOUNTER — Other Ambulatory Visit: Payer: Self-pay

## 2020-04-27 ENCOUNTER — Encounter: Payer: Self-pay | Admitting: Nurse Practitioner

## 2020-04-27 ENCOUNTER — Ambulatory Visit (INDEPENDENT_AMBULATORY_CARE_PROVIDER_SITE_OTHER): Payer: Medicare HMO | Admitting: Nurse Practitioner

## 2020-04-27 ENCOUNTER — Other Ambulatory Visit: Payer: Self-pay | Admitting: Nurse Practitioner

## 2020-04-27 VITALS — BP 128/70 | HR 65 | Temp 98.5°F | Ht 68.8 in | Wt 236.2 lb

## 2020-04-27 DIAGNOSIS — E0590 Thyrotoxicosis, unspecified without thyrotoxic crisis or storm: Secondary | ICD-10-CM

## 2020-04-27 DIAGNOSIS — I10 Essential (primary) hypertension: Secondary | ICD-10-CM

## 2020-04-27 DIAGNOSIS — E782 Mixed hyperlipidemia: Secondary | ICD-10-CM | POA: Diagnosis not present

## 2020-04-27 NOTE — Progress Notes (Signed)
This visit occurred during the SARS-CoV-2 public health emergency.  Safety protocols were in place, including screening questions prior to the visit, additional usage of staff PPE, and extensive cleaning of exam room while observing appropriate contact time as indicated for disinfecting solutions.  Subjective:     Patient ID: Jesus Lam , male    DOB: 1933/03/09 , 86 y.o.   MRN: [id number]   Chief Complaint  Patient presents with   Hypertension    HPI  Hypertension This is a chronic problem. The current episode started more than 1 year ago. The problem is unchanged. The problem is controlled. Pertinent negatives include no anxiety, chest pain, headaches or palpitations. There are no associated agents to hypertension. Risk factors for coronary artery disease include obesity, male gender and sedentary lifestyle.     Past Medical History:  Diagnosis Date   Anemia    BPH (benign prostatic hyperplasia)    Cataracts, bilateral    CVA (cerebral vascular accident) ([place])    DVT (deep venous thrombosis) (HCC)    dvt in left leg   Dyslipidemia    Gout    Gout    HTN (hypertension)    Left leg pain    Osteoarthritis    PAD (peripheral artery disease) (HCC)    Stasis dermatitis    Stroke ([place])    Vitamin D deficiency      Family History  Problem Relation Age of Onset   Cancer Mother    Cancer Father    Alcohol abuse Father    Breast cancer Daughter    Stroke Daughter    CVA Daughter      Current Outpatient Medications:    amLODipine (NORVASC) 5 MG tablet, Take 1 tablet (5 mg total) by mouth daily., Disp: 5 tablet, Rfl: 0   atorvastatin (LIPITOR) 80 MG tablet, TAKE 1 TABLET ONE TIME DAILY, Disp: 5 tablet, Rfl: 0   chlorthalidone (HYGROTON) 25 MG tablet, Take 1 tablet (25 mg total) by mouth daily., Disp: 5 tablet, Rfl: 0   Cholecalciferol (VITAMIN D3) 5000 units CAPS, Take 5,000 Units by mouth daily., Disp: , Rfl:    clopidogrel (PLAVIX) 75 MG  tablet, Take 1 tablet (75 mg total) by mouth daily., Disp: 5 tablet, Rfl: 0   diclofenac Sodium (VOLTAREN) 1 % GEL, Apply 2 g topically 4 (four) times daily., Disp: 100 g, Rfl: 2   Elastic Bandages & Supports (MEDICAL COMPRESSION STOCKINGS) MISC, , Disp: , Rfl:    furosemide (LASIX) 20 MG tablet, TAKE ONE TABLET BY MOUTH DAILY, Disp: 90 tablet, Rfl: 1   gabapentin (NEURONTIN) 300 MG capsule, TAKE ONE CAPSULE BY MOUTH AT BEDTIME, Disp: 90 capsule, Rfl: 0   icosapent Ethyl (VASCEPA) 1 g capsule, Take 1 capsule (1 g total) by mouth 2 (two) times daily., Disp: 60 capsule, Rfl: 2   isosorbide-hydrALAZINE (BIDIL) 20-37.5 MG tablet, Take 1 tablet by mouth daily, Disp: 5 tablet, Rfl: 0   ketorolac (ACULAR) 0.5 % ophthalmic solution, ketorolac 0.5 % eye drops, Disp: , Rfl:    Menthol-Methyl Salicylate (MUSCLE RUB) 10-15 % CREA, muscle rub, Disp: , Rfl:    methimazole (TAPAZOLE) 10 MG tablet, TAKE 1 TABLET (10 MG TOTAL) BY MOUTH DAILY., Disp: 30 tablet, Rfl: 0   Multiple Vitamins-Minerals (COMPLETE SENIOR PO), 1 tablet daily. , Disp: , Rfl:    Omega-3 Fatty Acids (FISH OIL PO), Take 1,000 mg by mouth. , Disp: , Rfl:    tamsulosin (FLOMAX) 0.4 MG CAPS capsule, TAKE ONE CAPSULE  BY MOUTH DAILY . TAKE HALF HOUR FOLLOWING THE SAME MEAL DAILY, Disp: 90 capsule, Rfl: 1   vitamin C (ASCORBIC ACID) 500 MG tablet, Take 500 mg daily by mouth., Disp: , Rfl:    Allergies  Allergen Reactions   Shellfish Allergy Other (See Comments)    Gout      Review of Systems  Constitutional: Negative.  Negative for fatigue.  Respiratory: Negative.   Cardiovascular: Negative.  Negative for chest pain, palpitations and leg swelling.  Endocrine: Negative for polydipsia, polyphagia and polyuria.  Neurological: Negative for dizziness and headaches.  Psychiatric/Behavioral: Negative.      Today's Vitals   04/27/20 1414  BP: 128/70  Pulse: 65  Temp: 98.5 F (36.9 C)  TempSrc: Oral  Weight: 236 lb 3.2 oz  (107.1 kg)  Height: 5' 8.8" (1.748 m)  PainSc: 8   PainLoc: Knee   Body mass index is 35.08 kg/m.   Objective:  Physical Exam Vitals reviewed.  Constitutional:      General: He is not in acute distress.    Appearance: Normal appearance. He is obese.  Cardiovascular:     Rate and Rhythm: Normal rate and regular rhythm.     Pulses: Normal pulses.     Heart sounds: Normal heart sounds. No murmur.  Pulmonary:     Effort: Pulmonary effort is normal. No respiratory distress.     Breath sounds: Normal breath sounds.  Neurological:     General: No focal deficit present.     Mental Status: He is alert and oriented to person, place, and time.     Cranial Nerves: No cranial nerve deficit.  Psychiatric:        Mood and Affect: Mood normal.        Behavior: Behavior normal.        Thought Content: Thought content normal.        Judgment: Judgment normal.         Assessment And Plan:     1. Subclinical hyperthyroidism  Chronic, no current symptoms  Will check levels - TSH - T3, free - T4 - CMP14+EGFR  2. Essential hypertension  B/P is well controlled.   Will check CMP for renal function  The importance of regular exercise and dietary modification was stressed to the patient.   3. Mixed hyperlipidemia  Chronic, stable  Will check lipid panel  - Lipid panel   Minette Brine, FNP    THE PATIENT IS ENCOURAGED TO PRACTICE SOCIAL DISTANCING DUE TO THE COVID-19 PANDEMIC.

## 2020-04-28 LAB — CMP14+EGFR
ALT: 13 IU/L (ref 0–44)
AST: 21 IU/L (ref 0–40)
Albumin/Globulin Ratio: 1.2 (ref 1.2–2.2)
Albumin: 3.9 g/dL (ref 3.6–4.6)
Alkaline Phosphatase: 86 IU/L (ref 48–121)
BUN/Creatinine Ratio: 10 (ref 10–24)
BUN: 12 mg/dL (ref 8–27)
Bilirubin Total: 0.7 mg/dL (ref 0.0–1.2)
CO2: 26 mmol/L (ref 20–29)
Calcium: 9.4 mg/dL (ref 8.6–10.2)
Chloride: 106 mmol/L (ref 96–106)
Creatinine, Ser: 1.16 mg/dL (ref 0.76–1.27)
GFR calc Af Amer: 66 mL/min/{1.73_m2} (ref >59)
GFR calc non Af Amer: 57 mL/min/{1.73_m2} — ABNORMAL LOW (ref >59)
Globulin, Total: 3.2 g/dL (ref 1.5–4.5)
Glucose: 86 mg/dL (ref 65–99)
Potassium: 4.8 mmol/L (ref 3.5–5.2)
Sodium: 141 mmol/L (ref 134–144)
Total Protein: 7.1 g/dL (ref 6.0–8.5)

## 2020-04-28 LAB — T4: T4, Total: 7 ug/dL (ref 4.5–12.0)

## 2020-04-28 LAB — LIPID PANEL
Chol/HDL Ratio: 3.5 ratio (ref 0.0–5.0)
Cholesterol, Total: 120 mg/dL (ref 100–199)
HDL: 34 mg/dL — ABNORMAL LOW (ref >39)
LDL Chol Calc (NIH): 58 mg/dL (ref 0–99)
Triglycerides: 165 mg/dL — ABNORMAL HIGH (ref 0–149)
VLDL Cholesterol Cal: 28 mg/dL (ref 5–40)

## 2020-04-28 LAB — T3, FREE: T3, Free: 3 pg/mL (ref 2.0–4.4)

## 2020-04-28 LAB — TSH: TSH: 0.949 u[IU]/mL (ref 0.450–4.500)

## 2020-05-05 DIAGNOSIS — R6889 Other general symptoms and signs: Secondary | ICD-10-CM | POA: Diagnosis not present

## 2020-05-08 ENCOUNTER — Telehealth: Payer: Self-pay

## 2020-05-08 NOTE — Telephone Encounter (Signed)
The pt was notified of his most recent lab results.

## 2020-05-11 ENCOUNTER — Telehealth: Payer: Self-pay

## 2020-05-11 DIAGNOSIS — R6889 Other general symptoms and signs: Secondary | ICD-10-CM | POA: Diagnosis not present

## 2020-05-12 DIAGNOSIS — R6889 Other general symptoms and signs: Secondary | ICD-10-CM | POA: Diagnosis not present

## 2020-05-15 ENCOUNTER — Other Ambulatory Visit: Payer: Self-pay | Admitting: Nurse Practitioner

## 2020-05-15 ENCOUNTER — Other Ambulatory Visit: Payer: Self-pay | Admitting: Cardiology

## 2020-05-15 DIAGNOSIS — M25552 Pain in left hip: Secondary | ICD-10-CM

## 2020-05-15 DIAGNOSIS — E781 Pure hyperglyceridemia: Secondary | ICD-10-CM

## 2020-05-15 DIAGNOSIS — G8929 Other chronic pain: Secondary | ICD-10-CM

## 2020-05-15 DIAGNOSIS — E0590 Thyrotoxicosis, unspecified without thyrotoxic crisis or storm: Secondary | ICD-10-CM

## 2020-05-19 DIAGNOSIS — R6889 Other general symptoms and signs: Secondary | ICD-10-CM | POA: Diagnosis not present

## 2020-05-22 DIAGNOSIS — R6889 Other general symptoms and signs: Secondary | ICD-10-CM | POA: Diagnosis not present

## 2020-05-24 DIAGNOSIS — R6889 Other general symptoms and signs: Secondary | ICD-10-CM | POA: Diagnosis not present

## 2020-06-07 ENCOUNTER — Other Ambulatory Visit: Payer: Self-pay | Admitting: Cardiology

## 2020-06-07 DIAGNOSIS — I739 Peripheral vascular disease, unspecified: Secondary | ICD-10-CM

## 2020-06-09 ENCOUNTER — Ambulatory Visit: Payer: Medicare HMO | Admitting: Cardiology

## 2020-06-09 DIAGNOSIS — R6889 Other general symptoms and signs: Secondary | ICD-10-CM | POA: Diagnosis not present

## 2020-06-12 DIAGNOSIS — R6889 Other general symptoms and signs: Secondary | ICD-10-CM | POA: Diagnosis not present

## 2020-06-16 ENCOUNTER — Telehealth: Payer: Self-pay

## 2020-06-16 DIAGNOSIS — R6889 Other general symptoms and signs: Secondary | ICD-10-CM | POA: Diagnosis not present

## 2020-06-19 ENCOUNTER — Other Ambulatory Visit: Payer: Self-pay | Admitting: Nurse Practitioner

## 2020-06-19 DIAGNOSIS — E0590 Thyrotoxicosis, unspecified without thyrotoxic crisis or storm: Secondary | ICD-10-CM

## 2020-06-23 DIAGNOSIS — R6889 Other general symptoms and signs: Secondary | ICD-10-CM | POA: Diagnosis not present

## 2020-07-03 ENCOUNTER — Ambulatory Visit: Payer: Medicare HMO | Admitting: Cardiology

## 2020-07-03 NOTE — Progress Notes (Signed)
Rescheduled

## 2020-07-07 ENCOUNTER — Other Ambulatory Visit: Payer: Self-pay | Admitting: Nurse Practitioner

## 2020-07-07 ENCOUNTER — Other Ambulatory Visit: Payer: Self-pay | Admitting: Cardiology

## 2020-07-07 DIAGNOSIS — G8929 Other chronic pain: Secondary | ICD-10-CM

## 2020-07-07 DIAGNOSIS — E781 Pure hyperglyceridemia: Secondary | ICD-10-CM

## 2020-07-07 DIAGNOSIS — M25552 Pain in left hip: Secondary | ICD-10-CM

## 2020-07-07 DIAGNOSIS — M25562 Pain in left knee: Secondary | ICD-10-CM

## 2020-07-10 ENCOUNTER — Encounter: Payer: Self-pay | Admitting: Cardiology

## 2020-07-10 ENCOUNTER — Other Ambulatory Visit: Payer: Self-pay

## 2020-07-10 ENCOUNTER — Ambulatory Visit (INDEPENDENT_AMBULATORY_CARE_PROVIDER_SITE_OTHER): Payer: Medicare HMO | Admitting: Cardiology

## 2020-07-10 VITALS — BP 153/64 | HR 59 | Resp 17 | Ht 68.0 in | Wt 238.0 lb

## 2020-07-10 DIAGNOSIS — I10 Essential (primary) hypertension: Secondary | ICD-10-CM

## 2020-07-10 DIAGNOSIS — E782 Mixed hyperlipidemia: Secondary | ICD-10-CM

## 2020-07-10 DIAGNOSIS — I440 Atrioventricular block, first degree: Secondary | ICD-10-CM | POA: Diagnosis not present

## 2020-07-10 DIAGNOSIS — I739 Peripheral vascular disease, unspecified: Secondary | ICD-10-CM

## 2020-07-10 NOTE — Progress Notes (Signed)
Subjective:   Jesus Lam, male    DOB: [birth year], 86 y.o.   MRN: [id number]   Chief complaint:  PAD   HPI  86 year old African-American male with severe bilateral PAD with class III claudication, now s/p successful DCB Rt SFA and 2 overlapping Zilver PTX stents placed left SFA (12/2017), s/p left temporal occipital cortical infarct (05/2018).  He has started swimming back and is enjoying it. He still has knee pain, but does not have any lifestyle limiting claudication symptoms.    Current Outpatient Medications on File Prior to Visit  Medication Sig Dispense Refill  . amLODipine (NORVASC) 5 MG tablet Take 1 tablet (5 mg total) by mouth daily. 5 tablet 0  . atorvastatin (LIPITOR) 80 MG tablet TAKE 1 TABLET ONE TIME DAILY 5 tablet 0  . chlorthalidone (HYGROTON) 25 MG tablet Take 1 tablet (25 mg total) by mouth daily. 5 tablet 0  . Cholecalciferol (VITAMIN D3) 5000 units CAPS Take 5,000 Units by mouth daily.    . clopidogrel (PLAVIX) 75 MG tablet Take 1 tablet (75 mg total) by mouth daily. 5 tablet 0  . diclofenac Sodium (VOLTAREN) 1 % GEL APPLY TWO GRAMS TOPICALLY FOUR TIMES A DAY 100 g 0  . Elastic Bandages & Supports (MEDICAL COMPRESSION STOCKINGS) MISC     . furosemide (LASIX) 20 MG tablet TAKE ONE TABLET BY MOUTH DAILY 90 tablet 1  . gabapentin (NEURONTIN) 300 MG capsule TAKE ONE CAPSULE BY MOUTH AT BEDTIME 90 capsule 0  . isosorbide-hydrALAZINE (BIDIL) 20-37.5 MG tablet Take 1 tablet by mouth daily 5 tablet 0  . ketorolac (ACULAR) 0.5 % ophthalmic solution ketorolac 0.5 % eye drops    . Menthol-Methyl Salicylate (MUSCLE RUB) 10-15 % CREA muscle rub    . methimazole (TAPAZOLE) 10 MG tablet TAKE ONE TABLET BY MOUTH DAILY 30 tablet 1  . Multiple Vitamins-Minerals (COMPLETE SENIOR PO) 1 tablet daily.     . Omega-3 Fatty Acids (FISH OIL PO) Take 1,000 mg by mouth.     . tamsulosin (FLOMAX) 0.4 MG CAPS capsule TAKE ONE CAPSULE BY MOUTH DAILY . TAKE HALF HOUR FOLLOWING THE SAME  MEAL DAILY 90 capsule 1  . VASCEPA 1 g capsule TAKE ONE CAPSULE BY MOUTH TWICE A DAY 60 capsule 0  . vitamin C (ASCORBIC ACID) 500 MG tablet Take 500 mg daily by mouth.     No current facility-administered medications on file prior to visit.    Cardiovascular studies:  EKG 02/18/2020: Sinus rhythm 51 bpm First degree A-V block  Left anterior fascicular block.   ABI 02/05/2018: This exam reveals moderately decreased perfusion of the lower extremity, noted at  the post tibial artery level (ABI). Right ABI 0.79 and left ABI 0.71. Compared to  11/19/2017:   LABI 0.67 and RABI 0.59.  Periperal angiography 01/27/2018:  Right mid and distal SFA CTO, moderate calcification evident. Successful PTA and stenting of the CTO right mid and distal SFA with antegrade left femoral and retrograde right PT access and implantation of 2 overlapping 6.0 x 1 20 mm Zilver PTX drug-coated stents. Stenosis reduced from 100% to 0%. Brisk flow was evident at the end of the procedure..  Echocardiogram 07/24/2018: 1. Left ventricle cavity is normal in size. Mild concentric hypertrophy of the left ventricle. Doppler evidence of grade I (impaired) diastolic dysfunction, normal LAP. Left ventricle regional wall motion findings: Basal and mid inferior hypokinesis. Calculated EF 46%. 2. Moderate aortic valve leaflet thickening. Mildly restricted aortic valve leaflets. No evidence  of aortic valve stenosis. Mild (Grade I) aortic regurgitation. 3. Structurally normal mitral valve with mild (Grade I) regurgitation. 4. Compared to the study done on 09/20/2017, no significant change in LVEF. Wall motion appears to be new. Previously mitral regurgitation was mild to moderate.   Recent labs: 04/27/2020: Glucose 86, BUN/Cr 12/1.16. EGFR 66. Na/K 141/4.8. Rest of the CMP normal Chol 120, TG 165, HDL 34, LDL 58 TSH 0.9 normal  02/03/2020: Glucose 81, BUN/Cr 16/1.1. EGFR 70. Na/K 142/4.1. Rest of the CMP normal HbA1C 5.1% Chol  140, TG 253, HDL 31, LDL 68 TSH 8.5, elevated  12/29/2019: H/H 11.7/36.4. MCV 92. Platelets 194 Chol 108, TG 143, HDL 32, LDL 51   Review of Systems  Cardiovascular: Positive for claudication (Left leg). Negative for chest pain, dyspnea on exertion, leg swelling, palpitations and syncope.  Musculoskeletal: Positive for joint pain.       Vitals:   07/10/20 1525  BP: (!) 153/64  Pulse: (!) 59  Resp: 17  SpO2: 98%     Body mass index is 36.19 kg/m. Filed Weights   07/10/20 1525  Weight: 238 lb (108 kg)      Objective:    Physical Exam Vitals and nursing note reviewed.  Constitutional:      Appearance: He is well-developed.  Neck:     Vascular: No JVD.  Cardiovascular:     Rate and Rhythm: Normal rate and regular rhythm.     Pulses: Decreased pulses.          Femoral pulses are 3+ on the right side and 3+ on the left side.      Popliteal pulses are 1+ on the right side and 1+ on the left side.       Dorsalis pedis pulses are 0 on the right side and 0 on the left side.       Posterior tibial pulses are 0 on the right side and 0 on the left side.     Heart sounds: Normal heart sounds. No murmur heard.   Pulmonary:     Effort: Pulmonary effort is normal.     Breath sounds: Normal breath sounds. No wheezing or rales.           Assessment & Recommendations:   86 year old African-American male with severe bilateral PAD with class III claudication, now s/p successful DCB Rt SFA and 2 overlapping Zilver PTX stents placed left SFA (12/2017), s/p left temporal occipital cortical infarct (05/2018).  PAD: No lifestyle limiting claudication, or critical limb ischemia. Continue medical management.  Hypertension: Elevated today. It appears that he is taking Bidil one a day. Arranged for remote patient monitoring through pur pharmacist Manuela Schwartz. If BP remains elevated, increase Bidil to tid.   Hypertriglyceridemia: Improved on lipitor and vascepa.  F/u in 6  months  Nilo Fallin Esther Hardy, MD [facility] Pager: [phone] Office: [phone] If no answer Cell [phone]

## 2020-07-24 DIAGNOSIS — R6889 Other general symptoms and signs: Secondary | ICD-10-CM | POA: Diagnosis not present

## 2020-07-25 DIAGNOSIS — I10 Essential (primary) hypertension: Secondary | ICD-10-CM | POA: Diagnosis not present

## 2020-07-26 DIAGNOSIS — R6889 Other general symptoms and signs: Secondary | ICD-10-CM | POA: Diagnosis not present

## 2020-07-28 DIAGNOSIS — R6889 Other general symptoms and signs: Secondary | ICD-10-CM | POA: Diagnosis not present

## 2020-08-02 DIAGNOSIS — R6889 Other general symptoms and signs: Secondary | ICD-10-CM | POA: Diagnosis not present

## 2020-08-03 ENCOUNTER — Other Ambulatory Visit: Payer: Self-pay

## 2020-08-03 ENCOUNTER — Ambulatory Visit (INDEPENDENT_AMBULATORY_CARE_PROVIDER_SITE_OTHER): Payer: Medicare HMO | Admitting: Nurse Practitioner

## 2020-08-03 ENCOUNTER — Encounter: Payer: Self-pay | Admitting: Nurse Practitioner

## 2020-08-03 VITALS — BP 140/78 | HR 68 | Temp 98.3°F | Ht 68.0 in | Wt 238.0 lb

## 2020-08-03 DIAGNOSIS — I10 Essential (primary) hypertension: Secondary | ICD-10-CM | POA: Diagnosis not present

## 2020-08-03 DIAGNOSIS — R413 Other amnesia: Secondary | ICD-10-CM | POA: Diagnosis not present

## 2020-08-03 DIAGNOSIS — E7800 Pure hypercholesterolemia, unspecified: Secondary | ICD-10-CM | POA: Diagnosis not present

## 2020-08-03 DIAGNOSIS — Z8619 Personal history of other infectious and parasitic diseases: Secondary | ICD-10-CM | POA: Diagnosis not present

## 2020-08-03 DIAGNOSIS — E0590 Thyrotoxicosis, unspecified without thyrotoxic crisis or storm: Secondary | ICD-10-CM

## 2020-08-03 DIAGNOSIS — Z23 Encounter for immunization: Secondary | ICD-10-CM | POA: Diagnosis not present

## 2020-08-03 NOTE — Progress Notes (Signed)
I,[name],acting as a scribe for  , FNP.,have documented all relevant documentation on the behalf of  , FNP,as directed by   , FNP while in the presence of  , FNP.   This visit occurred during the SARS-CoV-2 public health emergency.  Safety protocols were in place, including screening questions prior to the visit, additional usage of staff PPE, and extensive cleaning of exam room while observing appropriate contact time as indicated for disinfecting solutions.  Subjective:     Patient ID: Jesus Lam , male    DOB: 01/31/1933 , 86 y.o.   MRN: [id number]   Chief Complaint  Patient presents with  . Hypertension    HPI  Pt is here today for a hypertension follow up  Wt Readings from Last 3 Encounters: 08/03/20 : 238 lb (108 kg) 07/10/20 : 238 lb (108 kg) 04/27/20 : 236 lb 3.2 oz (107.1 kg)  He reports his memory has been getting worse, he will forget his name and when he gets excited he has trouble with focusing.  States "I need help with my memory"     Hypertension This is a chronic problem. The current episode started more than 1 year ago. The problem is unchanged. The problem is controlled. Pertinent negatives include no anxiety, chest pain, headaches or palpitations. There are no associated agents to hypertension. Risk factors for coronary artery disease include obesity, male gender and sedentary lifestyle. Past treatments include calcium channel blockers. The current treatment provides moderate improvement. There are no compliance problems.  There is no history of angina. Identifiable causes of hypertension include a thyroid problem. There is no history of chronic renal disease.     Past Medical History:  Diagnosis Date  . Anemia   . BPH (benign prostatic hyperplasia)   . Cataracts, bilateral   . CVA (cerebral vascular accident) ([facility])   . DVT (deep venous thrombosis) (H[facility])    dvt in left leg  . Dyslipidemia   . Gout   .  Gout   . HTN (hypertension)   . Left hip pain 03/15/2020  . Left leg pain   . Osteoarthritis   . PAD (peripheral artery disease) (HCC)   . Stasis dermatitis   . Stroke (HCC)   . Vitamin D deficiency      Family History  Problem Relation Age of Onset  . Cancer Mother   . Cancer Father   . Alcohol abuse Father   . Breast cancer Daughter   . Stroke Daughter   . CVA Daughter      Current Outpatient Medications:  .  amLODipine (NORVASC) 5 MG tablet, Take 1 tablet (5 mg total) by mouth daily., Disp: 5 tablet, Rfl: 0 .  atorvastatin (LIPITOR) 80 MG tablet, TAKE 1 TABLET ONE TIME DAILY, Disp: 5 tablet, Rfl: 0 .  chlorthalidone (HYGROTON) 25 MG tablet, Take 1 tablet (25 mg total) by mouth daily., Disp: 5 tablet, Rfl: 0 .  Cholecalciferol (VITAMIN D3) 5000 units CAPS, Take 5,000 Units by mouth daily., Disp: , Rfl:  .  clopidogrel (PLAVIX) 75 MG tablet, Take 1 tablet (75 mg total) by mouth daily., Disp: 5 tablet, Rfl: 0 .  diclofenac Sodium (VOLTAREN) 1 % GEL, APPLY TWO GRAMS TOPICALLY FOUR TIMES A DAY, Disp: 100 g, Rfl: 0 .  Elastic Bandages & Supports (MEDICAL COMPRESSION STOCKINGS) MISC, , Disp: , Rfl:  .  furosemide (LASIX) 20 MG tablet, TAKE ONE TABLET BY MOUTH DAILY, Disp: 90 tablet, Rfl: 1 .  gabapentin (NEURONTIN)   300 MG capsule, TAKE ONE CAPSULE BY MOUTH AT BEDTIME, Disp: 90 capsule, Rfl: 0 .  isosorbide-hydrALAZINE (BIDIL) 20-37.5 MG tablet, Take 1 tablet by mouth daily, Disp: 5 tablet, Rfl: 0 .  ketorolac (ACULAR) 0.5 % ophthalmic solution, ketorolac 0.5 % eye drops, Disp: , Rfl:  .  Menthol-Methyl Salicylate (MUSCLE RUB) 10-15 % CREA, muscle rub, Disp: , Rfl:  .  methimazole (TAPAZOLE) 10 MG tablet, TAKE ONE TABLET BY MOUTH DAILY, Disp: 30 tablet, Rfl: 1 .  Multiple Vitamins-Minerals (COMPLETE SENIOR PO), 1 tablet daily. , Disp: , Rfl:  .  Omega-3 Fatty Acids (FISH OIL PO), Take 1,000 mg by mouth. , Disp: , Rfl:  .  tamsulosin (FLOMAX) 0.4 MG CAPS capsule, TAKE ONE CAPSULE BY  MOUTH DAILY . TAKE HALF HOUR FOLLOWING THE SAME MEAL DAILY, Disp: 90 capsule, Rfl: 1 .  VASCEPA 1 g capsule, TAKE ONE CAPSULE BY MOUTH TWICE A DAY, Disp: 60 capsule, Rfl: 0 .  vitamin C (ASCORBIC ACID) 500 MG tablet, Take 500 mg daily by mouth., Disp: , Rfl:    Allergies  Allergen Reactions  . Shellfish Allergy Other (See Comments)    Gout      Review of Systems  Constitutional: Negative.   Respiratory: Negative.   Cardiovascular: Negative for chest pain, palpitations and leg swelling.  Neurological: Negative for headaches.  Psychiatric/Behavioral: Negative.      Today's Vitals   08/03/20 1414  BP: 140/78  Pulse: 68  Temp: 98.3 F (36.8 C)  TempSrc: Oral  Weight: 238 lb (108 kg)  Height: 5' 8" (1.727 m)  PainSc: 9   PainLoc: Knee   Body mass index is 36.19 kg/m.   Objective:  Physical Exam Vitals reviewed.  Constitutional:      General: He is not in acute distress.    Appearance: Normal appearance. He is obese.  Cardiovascular:     Rate and Rhythm: Normal rate and regular rhythm.     Pulses: Normal pulses.     Heart sounds: Normal heart sounds. No murmur heard.   Pulmonary:     Effort: Pulmonary effort is normal. No respiratory distress.     Breath sounds: Normal breath sounds.  Neurological:     General: No focal deficit present.     Mental Status: He is alert and oriented to person, place, and time.     Cranial Nerves: No cranial nerve deficit.  Psychiatric:        Mood and Affect: Mood normal.        Behavior: Behavior normal.        Thought Content: Thought content normal.        Judgment: Judgment normal.         Assessment And Plan:     1. Essential hypertension  Chronic, blood pressure is fairly controlled  Continue follow up with Cardiology - CMP14+EGFR  2. Pure hypercholesterolemia  Chronic, improved at last visit  Continue with current medications, tolerating well - Lipid panel  3. Memory changes  His most recent 6CIT was 14  previous year was 4  Will check for metabolic causes, pending results will consider starting donepezil - Vitamin B12  4. History of syphilis  Reports he was treated as a young man over 28 years ago - [facility]  5. Subclinical hyperthyroidism  Chronic, normal at this time - TSH - T4 - T3, free  6. Need for influenza vaccination  Influenza vaccine administered  Encouraged to take Tylenol as needed for fever or muscle  aches. - Flu Vaccine QUAD High Dose(Fluad)     Patient was given opportunity to ask questions. Patient verbalized understanding of the plan and was able to repeat key elements of the plan. All questions were answered to their satisfaction.   I,  , FNP, have reviewed all documentation for this visit. The documentation on 08/03/20 for the exam, diagnosis, procedures, and orders are all accurate and complete.  THE PATIENT IS ENCOURAGED TO PRACTICE SOCIAL DISTANCING DUE TO THE COVID-19 PANDEMIC.   

## 2020-08-04 DIAGNOSIS — R6889 Other general symptoms and signs: Secondary | ICD-10-CM | POA: Diagnosis not present

## 2020-08-04 LAB — CMP14+EGFR
ALT: 13 IU/L (ref 0–44)
AST: 17 IU/L (ref 0–40)
Albumin/Globulin Ratio: 1.3 (ref 1.2–2.2)
Albumin: 4.2 g/dL (ref 3.6–4.6)
Alkaline Phosphatase: 109 IU/L (ref 48–121)
BUN/Creatinine Ratio: 13 (ref 10–24)
BUN: 15 mg/dL (ref 8–27)
Bilirubin Total: 0.4 mg/dL (ref 0.0–1.2)
CO2: 24 mmol/L (ref 20–29)
Calcium: 9.6 mg/dL (ref 8.6–10.2)
Chloride: 107 mmol/L — ABNORMAL HIGH (ref 96–106)
Creatinine, Ser: 1.19 mg/dL (ref 0.76–1.27)
GFR calc Af Amer: 64 mL/min/{1.73_m2} (ref >59)
GFR calc non Af Amer: 55 mL/min/{1.73_m2} — ABNORMAL LOW (ref >59)
Globulin, Total: 3.2 g/dL (ref 1.5–4.5)
Glucose: 87 mg/dL (ref 65–99)
Potassium: 5.1 mmol/L (ref 3.5–5.2)
Sodium: 143 mmol/L (ref 134–144)
Total Protein: 7.4 g/dL (ref 6.0–8.5)

## 2020-08-04 LAB — LIPID PANEL
Chol/HDL Ratio: 3.9 ratio (ref 0.0–5.0)
Cholesterol, Total: 147 mg/dL (ref 100–199)
HDL: 38 mg/dL — ABNORMAL LOW (ref >39)
LDL Chol Calc (NIH): 76 mg/dL (ref 0–99)
Triglycerides: 195 mg/dL — ABNORMAL HIGH (ref 0–149)
VLDL Cholesterol Cal: 33 mg/dL (ref 5–40)

## 2020-08-04 LAB — T3, FREE: T3, Free: 3 pg/mL (ref 2.0–4.4)

## 2020-08-04 LAB — RPR: RPR Ser Ql: NONREACTIVE

## 2020-08-04 LAB — TSH: TSH: 3.74 u[IU]/mL (ref 0.450–4.500)

## 2020-08-04 LAB — T4: T4, Total: 5.4 ug/dL (ref 4.5–12.0)

## 2020-08-04 LAB — VITAMIN B12: Vitamin B-12: 606 pg/mL (ref 232–1245)

## 2020-08-07 DIAGNOSIS — R6889 Other general symptoms and signs: Secondary | ICD-10-CM | POA: Diagnosis not present

## 2020-08-08 ENCOUNTER — Ambulatory Visit: Payer: Self-pay

## 2020-08-08 ENCOUNTER — Telehealth: Payer: Medicare HMO

## 2020-08-08 ENCOUNTER — Other Ambulatory Visit: Payer: Self-pay | Admitting: Cardiology

## 2020-08-08 ENCOUNTER — Other Ambulatory Visit: Payer: Self-pay

## 2020-08-08 DIAGNOSIS — I739 Peripheral vascular disease, unspecified: Secondary | ICD-10-CM

## 2020-08-08 DIAGNOSIS — I10 Essential (primary) hypertension: Secondary | ICD-10-CM

## 2020-08-08 DIAGNOSIS — E7800 Pure hypercholesterolemia, unspecified: Secondary | ICD-10-CM

## 2020-08-08 DIAGNOSIS — E0590 Thyrotoxicosis, unspecified without thyrotoxic crisis or storm: Secondary | ICD-10-CM

## 2020-08-08 DIAGNOSIS — M79641 Pain in right hand: Secondary | ICD-10-CM

## 2020-08-08 DIAGNOSIS — R413 Other amnesia: Secondary | ICD-10-CM

## 2020-08-08 DIAGNOSIS — M25562 Pain in left knee: Secondary | ICD-10-CM

## 2020-08-09 DIAGNOSIS — R6889 Other general symptoms and signs: Secondary | ICD-10-CM | POA: Diagnosis not present

## 2020-08-09 DIAGNOSIS — H5203 Hypermetropia, bilateral: Secondary | ICD-10-CM | POA: Diagnosis not present

## 2020-08-10 NOTE — Chronic Care Management (AMB) (Addendum)
Chronic Care Management   Follow Up Note   08/08/2020 Name: Jesus Lam MRN: [id number] DOB: 01-17-1933  Referred by: Minette Brine, FNP Reason for referral : Chronic Care Management (FU RN CM Call )   Farouk Vivero is a 86 y.o. year old male who is a primary care patient of Minette Brine, [place]. The CCM team was consulted for assistance with chronic disease management and care coordination needs.    Review of patient status, including review of consultants reports, relevant laboratory and other test results, and collaboration with appropriate care team members and the patient's provider was performed as part of comprehensive patient evaluation and provision of chronic care management services.    SDOH (Social Determinants of Health) assessments performed: Yes - no acute challenges identified See Care Plan activities for detailed interventions related to [place])   Placed outbound call to patient for CCM RN CM care plan follow up.     Outpatient Encounter Medications as of 08/08/2020  Medication Sig   amLODipine (NORVASC) 5 MG tablet Take 1 tablet (5 mg total) by mouth daily.   atorvastatin (LIPITOR) 80 MG tablet TAKE 1 TABLET ONE TIME DAILY   chlorthalidone (HYGROTON) 25 MG tablet Take 1 tablet (25 mg total) by mouth daily.   Cholecalciferol (VITAMIN D3) 5000 units CAPS Take 5,000 Units by mouth daily.   diclofenac Sodium (VOLTAREN) 1 % GEL APPLY TWO GRAMS TOPICALLY FOUR TIMES A DAY   Elastic Bandages & Supports (MEDICAL COMPRESSION STOCKINGS) MISC    furosemide (LASIX) 20 MG tablet TAKE ONE TABLET BY MOUTH DAILY   gabapentin (NEURONTIN) 300 MG capsule TAKE ONE CAPSULE BY MOUTH AT BEDTIME   isosorbide-hydrALAZINE (BIDIL) 20-37.5 MG tablet Take 1 tablet by mouth daily   ketorolac (ACULAR) 0.5 % ophthalmic solution ketorolac 0.5 % eye drops   Menthol-Methyl Salicylate (MUSCLE RUB) 10-15 % CREA muscle rub   methimazole (TAPAZOLE) 10 MG tablet TAKE ONE TABLET BY MOUTH DAILY     Multiple Vitamins-Minerals (COMPLETE SENIOR PO) 1 tablet daily.    Omega-3 Fatty Acids (FISH OIL PO) Take 1,000 mg by mouth.    tamsulosin (FLOMAX) 0.4 MG CAPS capsule TAKE ONE CAPSULE BY MOUTH DAILY . TAKE HALF HOUR FOLLOWING THE SAME MEAL DAILY   VASCEPA 1 g capsule TAKE ONE CAPSULE BY MOUTH TWICE A DAY   vitamin C (ASCORBIC ACID) 500 MG tablet Take 500 mg daily by mouth.   [DISCONTINUED] clopidogrel (PLAVIX) 75 MG tablet Take 1 tablet (75 mg total) by mouth daily.   No facility-administered encounter medications on file as of 08/08/2020.     Objective:  Lab Results  Component Value Date   HGBA1C 5.1 02/03/2020   HGBA1C 5.3 06/13/2018   Lab Results  Component Value Date   MICROALBUR 30 02/03/2020   LDLCALC 76 08/03/2020   CREATININE 1.19 08/03/2020   BP Readings from Last 3 Encounters:  08/03/20 140/78  07/10/20 (!) 153/64  04/27/20 128/70    Goals Addressed      Patient Stated     COMPLETED: "I am having some knee pain" (pt-stated)        Current Barriers:   Knowledge Deficits related to evaluation and treatment of bilateral knee pain   Chronic Disease Management support and education needs related to HTN, Hyperthyroidism, Altered memory,  Bilateral hand pain, Medication management  Nurse Case Manager Clinical Goal(s):   Over the next 30 days, patient will verbalize understanding of plan for treatment of his knee pain  Goal Met  Over  the next 90 days, patient will report having decreased pain and improved ambulation w/o falls Goal Met  New 03/14/20 Over the next 30 days, patient will follow up with PCP for evaluation and treatment of reoccurring Left knee pain and swelling  New 03/14/20 Over the next 90 days, patient will verbalize and demonstrate improved ambulation and ROM as evidence by patient will be able to participate in water aerobics and perform self care with ease  CCM RN CM Interventions:  08/08/20 call completed with patient   Evaluation of  current treatment plan related to left knee pain and patient's adherence to plan as established by provider  Determined patient has improved mobility and decreased pain to his left knee since resuming water exercises at the [facility], he attends 3 days per week  Discussed plans with patient for ongoing care management follow up and provided patient with direct contact information for care management team  Patient Self Care Activities:   Self administers medications as prescribed  Attends all scheduled provider appointments  Calls pharmacy for medication refills  Performs ADL's independently  Performs IADL's independently  Calls provider office for new concerns or questions  Supportive daughter to assist with care needs  Please see past updates related to this goal by clicking on the "Past Updates" button in the selected goal       "I would like to learn what foods make my Triglycerides too high" (pt-stated)   On track     [place] (see longitudinal plan of care for additional care plan information)  Current Barriers:   Knowledge Deficits related to disease process and Self Health management of high Triglycerides Chronic Disease Management support and education needs related to HTN, Hyperthyroidism, Altered memory,  Bilateral hand pain, Medication management, Pure hypercholesterolemia, Chronic pain of left knee   Nurse Case Manager Clinical Goal(s):   08/08/20 New Over the next 180 days, patient will work with CCM RN CM and PCP to address needs related to disease education and support to help lower Triglycerides  CCM RN CM Interventions:  08/08/20 call completed with patient   Inter-disciplinary care team collaboration (see longitudinal plan of care)  Evaluation of current treatment plan related to elevated Triglycerides and patient's adherence to plan as established by provider.  Re-educated patient re: causes for elevated Triglycerides; Educated on dietary and exercise  recommendations; Determined patient swims at the [facility] at least 3 days per week  Reviewed medications with patient and discussed patient continues to take his cholesterol medication exactly as prescribed  Discussed plans with patient for ongoing care management follow up and provided patient with direct contact information for care management team  Provided patient with printed educational materials related to Triglycerides  Patient Self Care Activities:   Self administers medications as prescribed  Attends all scheduled provider appointments  Calls pharmacy for medication refills  Performs ADL's independently  Performs IADL's independently  Calls provider office for new concerns or questions  Supportive daughter to assist with care needs  Please see past updates related to this goal by clicking on the "Past Updates" button in the selected goal       Other     COMPLETED: "Abnormal TSH"        CARE PLAN ENTRY (see longitudinal plan of care for additional care plan information)  Current Barriers:   Knowledge Deficits related to disease process and Self Health management of Abnormal TSH  Chronic Disease Management support and education needs related to HTN, Hyperthyroidism, Altered  memory,  Bilateral hand pain, Medication management  Nurse Case Manager Clinical Goal(s):   Over the next 30 days, patient will work with CCM RN CM and PCP to address needs related to disease education and support for increased knowledge and understanding on how to improve Self Health management of thyroid dysfunction  Over the next 90 days, patient will verbalize increased understanding of how to manage/treat his abnormal TSH as evidence by patient will have a normal TSH level  CCM RN CM Interventions:  08/08/20 call completed with patient   Inter-disciplinary care team collaboration (see longitudinal plan of care)  Evaluation of current treatment plan related to thyroid dysfunction and  Abnormal TSH and patient's adherence to plan as established by provider  Discussed and reviewed with patient his most recent TSH is within normal range  Determined patient is adhering with taking his prescribed medication as directed w/o missed doses; Reiterated taking his thyroid medication on an empty stomach first thing in the morning 30-60 minutes before breakfast and at least 3-4 hours before taking certain medications and supplements such as fiber supplements, calcium and iron supplements, proton pump inhibitors (omeprazole/Prilosec and lansoprazole/Prevacid), soy products, and multivitamins with minerals  Discussed plans with patient for ongoing care management follow up and provided patient with direct contact information for care management team  Patient Self Care Activities:   Self administers medications as prescribed  Attends all scheduled provider appointments  Calls pharmacy for medication refills  Performs ADL's independently  Performs IADL's independently  Calls provider office for new concerns or questions  Supportive daughter to assist with care needs  Please see past updates related to this goal by clicking on the "Past Updates" button in the selected goal        COMPLETED: "I don't always know about his medication changes"        PATIENT'S DAUGHTER STATED  Current Barriers:   Cognitive Deficits related to altered mental status  Knowledge Deficits related to Medication Management  Nurse Case Manager Clinical Goal(s):   Over the next 30 days, patient will verbalize understanding of plan for better medication management including transitioning to blister pill packaging .   Over the next 30 days, patient/daughter will have a better understanding of patient's current medication regimen including the names of/indication for and dosages needed for all of patient's medications.   CCM RN CM Interventions:  08/08/20 call completed with patient   Assessed for  medication understanding and adherence  Determined patient is adhering with taking his medications independently but with the supervision of his daughter [name]  Determined he has his medications delivered from local pharmacy and is utilizing the pill package system  Discussed plans with patient for ongoing care management follow up and provided patient with direct contact information for care management team  Patient Self Care Activities:   Self administers medications as prescribed  Performs ADL's independently  Performs IADL's independently  Calls provider office for new concerns or questions  Supportive daughter to assist with care needs  Please see past updates related to this goal by clicking on the "Past Updates" button in the selected goal       Plan:   Telephone follow up appointment with care management team member scheduled for: 10/31/20  Barb Merino, RN, BSN, CCM Care Management Coordinator [facility]  Direct Phone: [phone]

## 2020-08-10 NOTE — Patient Instructions (Addendum)
Visit Information  Goals Addressed      Patient Stated   .  COMPLETED: "I am having some knee pain" (pt-stated)        Current Barriers:  Marland Kitchen Knowledge Deficits related to evaluation and treatment of bilateral knee pain  . Chronic Disease Management support and education needs related to HTN, Hyperthyroidism, Altered memory,  Bilateral hand pain, Medication management  Nurse Case Manager Clinical Goal(s):  Marland Kitchen Over the next 30 days, patient will verbalize understanding of plan for treatment of his knee pain  Goal Met . Over the next 90 days, patient will report having decreased pain and improved ambulation w/o falls Goal Met . New 03/14/20 Over the next 30 days, patient will follow up with PCP for evaluation and treatment of reoccurring Left knee pain and swelling . New 03/14/20 Over the next 90 days, patient will verbalize and demonstrate improved ambulation and ROM as evidence by patient will be able to participate in water aerobics and perform self care with ease  CCM RN CM Interventions:  08/08/20 call completed with patient  . Evaluation of current treatment plan related to left knee pain and patient's adherence to plan as established by provider . Determined patient has improved mobility and decreased pain to his left knee since resuming water exercises at the [facility], he attends 3 days per week . Discussed plans with patient for ongoing care management follow up and provided patient with direct contact information for care management team  Patient Self Care Activities:  . Self administers medications as prescribed . Attends all scheduled provider appointments . Calls pharmacy for medication refills . Performs ADL's independently . Performs IADL's independently . Calls provider office for new concerns or questions . Supportive daughter to assist with care needs  Please see past updates related to this goal by clicking on the "Past Updates" button in the selected goal     .  "I would  like to learn what foods make my Triglycerides too high" (pt-stated)   On track     [place] (see longitudinal plan of care for additional care plan information)  Current Barriers:  Marland Kitchen Knowledge Deficits related to disease process and Self Health management of high Triglycerides Chronic Disease Management support and education needs related to HTN, Hyperthyroidism, Altered memory,  Bilateral hand pain, Medication management, Pure hypercholesterolemia, Chronic pain of left knee    Nurse Case Manager Clinical Goal(s):  . 08/08/20 New Over the next 180 days, patient will work with CCM RN CM and PCP to address needs related to disease education and support to help lower Triglycerides  CCM RN CM Interventions:  08/08/20 call completed with patient  . Inter-disciplinary care team collaboration (see longitudinal plan of care) . Evaluation of current treatment plan related to elevated Triglycerides and patient's adherence to plan as established by provider. Marland Kitchen Re-educated patient re: causes for elevated Triglycerides; Educated on dietary and exercise recommendations; Determined patient swims at the [facility] at least 3 days per week . Reviewed medications with patient and discussed patient continues to take his cholesterol medication exactly as prescribed . Discussed plans with patient for ongoing care management follow up and provided patient with direct contact information for care management team . Provided patient with printed educational materials related to Triglycerides  Patient Self Care Activities:  . Self administers medications as prescribed . Attends all scheduled provider appointments . Calls pharmacy for medication refills . Performs ADL's independently . Performs IADL's independently . Calls provider office for  new concerns or questions . Supportive daughter to assist with care needs  Please see past updates related to this goal by clicking on the "Past Updates" button in the  selected goal       Other   .  COMPLETED: "Abnormal TSH"        CARE PLAN ENTRY (see longitudinal plan of care for additional care plan information)  Current Barriers:  Marland Kitchen Knowledge Deficits related to disease process and Self Health management of Abnormal TSH . Chronic Disease Management support and education needs related to HTN, Hyperthyroidism, Altered memory,  Bilateral hand pain, Medication management  Nurse Case Manager Clinical Goal(s):  Marland Kitchen Over the next 30 days, patient will work with CCM RN CM and PCP to address needs related to disease education and support for increased knowledge and understanding on how to improve Self Health management of thyroid dysfunction . Over the next 90 days, patient will verbalize increased understanding of how to manage/treat his abnormal TSH as evidence by patient will have a normal TSH level  CCM RN CM Interventions:  08/08/20 call completed with patient  . Inter-disciplinary care team collaboration (see longitudinal plan of care) . Evaluation of current treatment plan related to thyroid dysfunction and Abnormal TSH and patient's adherence to plan as established by provider . Discussed and reviewed with patient his most recent TSH is within normal range . Determined patient is adhering with taking his prescribed medication as directed w/o missed doses; Reiterated taking his thyroid medication on an empty stomach first thing in the morning 30-60 minutes before breakfast and at least 3-4 hours before taking certain medications and supplements such as fiber supplements, calcium and iron supplements, proton pump inhibitors (omeprazole/Prilosec and lansoprazole/Prevacid), soy products, and multivitamins with minerals . Discussed plans with patient for ongoing care management follow up and provided patient with direct contact information for care management team  Patient Self Care Activities:  . Self administers medications as prescribed . Attends all  scheduled provider appointments . Calls pharmacy for medication refills . Performs ADL's independently . Performs IADL's independently . Calls provider office for new concerns or questions . Supportive daughter to assist with care needs  Please see past updates related to this goal by clicking on the "Past Updates" button in the selected goal     .  COMPLETED: "I don't always know about his medication changes"        PATIENT'S DAUGHTER STATED  Current Barriers:  . Cognitive Deficits related to altered mental status . Knowledge Deficits related to Medication Management  Nurse Case Manager Clinical Goal(s):  Marland Kitchen Over the next 30 days, patient will verbalize understanding of plan for better medication management including transitioning to blister pill packaging .  Marland Kitchen Over the next 30 days, patient/daughter will have a better understanding of patient's current medication regimen including the names of/indication for and dosages needed for all of patient's medications.   CCM RN CM Interventions:  08/08/20 call completed with patient   Assessed for medication understanding and adherence  Determined patient is adhering with taking his medications independently but with the supervision of his daughter [name]  Determined he has his medications delivered from local pharmacy and is utilizing the pill package system  Discussed plans with patient for ongoing care management follow up and provided patient with direct contact information for care management team  Patient Self Care Activities:  . Self administers medications as prescribed . Performs ADL's independently . Performs IADL's independently . Calls provider office for  new concerns or questions . Supportive daughter to assist with care needs  Please see past updates related to this goal by clicking on the "Past Updates" button in the selected goal        Patient verbalizes understanding of instructions provided today.   Telephone  follow up appointment with care management team member scheduled for: 10/31/20  Barb Merino, RN, BSN, CCM Care Management Coordinator [facility] [place] [facility]  Direct Phone: [phone]

## 2020-08-11 ENCOUNTER — Other Ambulatory Visit: Payer: Self-pay | Admitting: Nurse Practitioner

## 2020-08-11 ENCOUNTER — Other Ambulatory Visit: Payer: Self-pay | Admitting: Cardiology

## 2020-08-11 DIAGNOSIS — I10 Essential (primary) hypertension: Secondary | ICD-10-CM

## 2020-08-11 DIAGNOSIS — E0590 Thyrotoxicosis, unspecified without thyrotoxic crisis or storm: Secondary | ICD-10-CM

## 2020-08-11 DIAGNOSIS — E782 Mixed hyperlipidemia: Secondary | ICD-10-CM

## 2020-08-11 DIAGNOSIS — E781 Pure hyperglyceridemia: Secondary | ICD-10-CM

## 2020-08-15 DIAGNOSIS — R6889 Other general symptoms and signs: Secondary | ICD-10-CM | POA: Diagnosis not present

## 2020-08-16 ENCOUNTER — Other Ambulatory Visit: Payer: Self-pay | Admitting: Cardiology

## 2020-08-16 DIAGNOSIS — R6889 Other general symptoms and signs: Secondary | ICD-10-CM | POA: Diagnosis not present

## 2020-08-16 DIAGNOSIS — E781 Pure hyperglyceridemia: Secondary | ICD-10-CM

## 2020-08-18 DIAGNOSIS — R6889 Other general symptoms and signs: Secondary | ICD-10-CM | POA: Diagnosis not present

## 2020-08-21 DIAGNOSIS — R6889 Other general symptoms and signs: Secondary | ICD-10-CM | POA: Diagnosis not present

## 2020-08-24 DIAGNOSIS — I10 Essential (primary) hypertension: Secondary | ICD-10-CM | POA: Diagnosis not present

## 2020-08-29 ENCOUNTER — Other Ambulatory Visit: Payer: Medicare HMO

## 2020-08-29 ENCOUNTER — Other Ambulatory Visit: Payer: Self-pay

## 2020-08-29 DIAGNOSIS — Z20822 Contact with and (suspected) exposure to covid-19: Secondary | ICD-10-CM

## 2020-08-30 LAB — NOVEL CORONAVIRUS, NAA: SARS-CoV-2, NAA: NOT DETECTED

## 2020-08-30 LAB — SARS-COV-2, NAA 2 DAY TAT

## 2020-09-06 ENCOUNTER — Other Ambulatory Visit: Payer: Self-pay | Admitting: Cardiology

## 2020-09-06 DIAGNOSIS — E781 Pure hyperglyceridemia: Secondary | ICD-10-CM

## 2020-09-06 DIAGNOSIS — E782 Mixed hyperlipidemia: Secondary | ICD-10-CM

## 2020-09-07 ENCOUNTER — Ambulatory Visit (INDEPENDENT_AMBULATORY_CARE_PROVIDER_SITE_OTHER): Payer: Medicare HMO | Admitting: Nurse Practitioner

## 2020-09-07 ENCOUNTER — Other Ambulatory Visit: Payer: Self-pay

## 2020-09-07 ENCOUNTER — Encounter: Payer: Self-pay | Admitting: Nurse Practitioner

## 2020-09-07 VITALS — BP 122/78 | HR 83 | Temp 99.0°F | Ht 67.8 in | Wt 240.2 lb

## 2020-09-07 DIAGNOSIS — G3184 Mild cognitive impairment, so stated: Secondary | ICD-10-CM

## 2020-09-07 MED ORDER — DONEPEZIL HCL 5 MG PO TABS: 5.0000 mg | ORAL_TABLET | Freq: Every day | ORAL | 0 refills | Status: DC

## 2020-09-07 NOTE — Patient Instructions (Signed)
Donepezil tablets What is this medicine? DONEPEZIL (doe NEP e zil) is used to treat mild to moderate dementia caused by Alzheimer's disease. This medicine may be used for other purposes; ask your health care provider or pharmacist if you have questions. COMMON BRAND NAME(S): Aricept What should I tell my health care provider before I take this medicine? They need to know if you have any of these conditions:  asthma or other lung disease  difficulty passing urine  head injury  heart disease  history of irregular heartbeat  liver disease  seizures (convulsions)  stomach or intestinal disease, ulcers or stomach bleeding  an unusual or allergic reaction to donepezil, other medicines, foods, dyes, or preservatives  pregnant or trying to get pregnant  breast-feeding How should I use this medicine? Take this medicine by mouth with a glass of water. Follow the directions on the prescription label. You may take this medicine with or without food. Take this medicine at regular intervals. This medicine is usually taken before bedtime. Do not take it more often than directed. Continue to take your medicine even if you feel better. Do not stop taking except on your doctor's advice. If you are taking the 23 mg donepezil tablet, swallow it whole; do not cut, crush, or chew it. Talk to your pediatrician regarding the use of this medicine in children. Special care may be needed. Overdosage: If you think you have taken too much of this medicine contact a poison control center or emergency room at once. NOTE: This medicine is only for you. Do not share this medicine with others. What if I miss a dose? If you miss a dose, take it as soon as you can. If it is almost time for your next dose, take only that dose, do not take double or extra doses. What may interact with this medicine? Do not take this medicine with any of the following medications:  certain medicines for fungal infections like  itraconazole, fluconazole, posaconazole, and voriconazole  cisapride  dextromethorphan; quinidine  dronedarone  pimozide  quinidine  thioridazine This medicine may also interact with the following medications:  antihistamines for allergy, cough and cold  atropine  bethanechol  carbamazepine  certain medicines for bladder problems like oxybutynin, tolterodine  certain medicines for Parkinson's disease like benztropine, trihexyphenidyl  certain medicines for stomach problems like dicyclomine, hyoscyamine  certain medicines for travel sickness like scopolamine  dexamethasone  dofetilide  ipratropium  NSAIDs, medicines for pain and inflammation, like ibuprofen or naproxen  other medicines for Alzheimer's disease  other medicines that prolong the QT interval (cause an abnormal heart rhythm)  phenobarbital  phenytoin  rifampin, rifabutin or rifapentine  ziprasidone This list may not describe all possible interactions. Give your health care provider a list of all the medicines, herbs, non-prescription drugs, or dietary supplements you use. Also tell them if you smoke, drink alcohol, or use illegal drugs. Some items may interact with your medicine. What should I watch for while using this medicine? Visit your doctor or health care professional for regular checks on your progress. Check with your doctor or health care professional if your symptoms do not get better or if they get worse. You may get drowsy or dizzy. Do not drive, use machinery, or do anything that needs mental alertness until you know how this drug affects you. What side effects may I notice from receiving this medicine? Side effects that you should report to your doctor or health care professional as soon as possible:    allergic reactions like skin rash, itching or hives, swelling of the face, lips, or tongue  feeling faint or lightheaded, falls  loss of bladder control  seizures  signs and  symptoms of a dangerous change in heartbeat or heart rhythm like chest pain; dizziness; fast or irregular heartbeat; palpitations; feeling faint or lightheaded, falls; breathing problems  signs and symptoms of infection like fever or chills; cough; sore throat; pain or trouble passing urine  signs and symptoms of liver injury like dark yellow or brown urine; general ill feeling or flu-like symptoms; light-colored stools; loss of appetite; nausea; right upper belly pain; unusually weak or tired; yellowing of the eyes or skin  slow heartbeat or palpitations  unusual bleeding or bruising  vomiting Side effects that usually do not require medical attention (report to your doctor or health care professional if they continue or are bothersome):  diarrhea, especially when starting treatment  headache  loss of appetite  muscle cramps  nausea  stomach upset This list may not describe all possible side effects. Call your doctor for medical advice about side effects. You may report side effects to FDA at 1-800-FDA-1088. Where should I keep my medicine? Keep out of reach of children. Store at room temperature between 15 and 30 degrees C (59 and 86 degrees F). Throw away any unused medicine after the expiration date. NOTE: This sheet is a summary. It may not cover all possible information. If you have questions about this medicine, talk to your doctor, pharmacist, or health care provider.  2020 Elsevier/Gold Standard (2018-11-02 10:33:41)   Dementia Dementia is a condition that affects the way the brain works. It often affects memory and thinking. There are many types of dementia. Some types get worse with time and cannot be reversed. Some types of dementia include:  Alzheimer's disease. This is the most common type.  Vascular dementia. This type may happen due to a stroke.  Lewy body dementia. This type may happen to people who have Parkinson's disease.  Frontotemporal dementia. This  type is caused by damage to nerve cells in certain parts of the brain. Some people may have more than one type, and this is called mixed dementia. What are the causes? This condition is caused by damage to cells in the brain. Some causes that cannot be reversed include:  Having a condition that affects the blood vessels of the brain, such as diabetes, heart disease, or blood vessel disease.  Changes to genes. Some causes that can be reversed or slowed include:  Injury to the brain.  Certain medicines.  Infection.  Not having enough vitamin B12 in the body, or thyroid problems.  A tumor or blood clot in the brain. What are the signs or symptoms? Symptoms depend on the type of dementia. This may include:  Problems remembering things.  Having trouble taking a bath or putting clothes on.  Forgetting appointments.  Forgetting to pay bills.  Trouble planning and making meals.  Having trouble speaking.  Getting lost easily. How is this treated? Treatment depends on the cause of the dementia. It might include taking medicines that help:  To control the dementia.  To slow down the dementia.  To manage symptoms. In some cases, treating the cause of your dementia can improve symptoms, reverse symptoms, or slow down how quickly it gets worse. Your doctor can help you find support groups and other doctors who can help with your care. Follow these instructions at home: Medicines  Take over-the-counter and prescription medicines  only as told by your doctor.  Use a pill organizer to help you manage your medicines.  Avoidtaking medicines for pain or for sleep. Lifestyle  Make healthy choices: ? Be active as told by your doctor. ? Do not use any products that contain nicotine or tobacco, such as cigarettes, e-cigarettes, and chewing tobacco. If you need help quitting, ask your doctor. ? Do not drink alcohol. ? When you get stressed, do something that will help you to relax.  Your doctor can give you tips. ? Spend time with other people.  Make sure you get good sleep. To get good sleep: ? Try not to take naps during the day. ? Keep your bedroom dark and cool. ? In the few hours before you go to bed, try not to do any exercise. ? Do not have foods and drinks with caffeine at night. Eating and drinking  Drink enough fluid to keep your pee (urine) pale yellow.  Eat a healthy diet. General instructions   Talk with your doctor to figure out: ? What you need help with. ? What your safety needs are.  Ask your doctor if it is safe for you to drive.  If told, wear a bracelet that tracks where you are or shows that you are a person with memory loss.  Work with your family to make big decisions.  Keep all follow-up visits as told by your doctor. This is important. Contact a doctor if:  You have any new symptoms.  Your symptoms get worse.  You have problems with swallowing or choking. Get help right away if:  You feel very sad, or feel that you want to harm yourself.  You or your family members are worried for your safety. If you ever feel like you may hurt yourself or others, or have thoughts about taking your own life, get help right away. You can go to your nearest emergency department or call:  Your local emergency services (911 in the [place]  A suicide crisis helpline, such as the National Suicide Prevention Lifeline at 725-342-8775. This is open 24 hours a day. Summary  Dementia often affects memory and thinking.  Some types of dementia get worse with time and cannot be reversed.  Treatment for this condition depends on the cause.  Talk with your doctor to figure out what you need help with.  Your doctor can help you find support groups and other doctors who can help with your care. This information is not intended to replace advice given to you by your health care provider. Make sure you discuss any questions you have with your health  care provider. Document Revised: 01/26/2019 Document Reviewed: 01/26/2019 Elsevier Patient Education  2020 [employer].

## 2020-09-07 NOTE — Progress Notes (Signed)
I,[name] as a Neurosurgeon for [employer], FNP.,have documented all relevant documentation on the behalf of [name], FNP,as directed by  [name], FNP while in the presence of [name], FNP. This visit occurred during the SARS-CoV-2 public health emergency.  Safety protocols were in place, including screening questions prior to the visit, additional usage of staff PPE, and extensive cleaning of exam room while observing appropriate contact time as indicated for disinfecting solutions.  Subjective:     Patient ID: [name] , male    DOB: [birth year] , 86 y.o.   MRN: [id number]   Chief Complaint  Patient presents with  . Hypertension    HPI  He continues to feel his memory is not doing well.      Past Medical History:  Diagnosis Date  . Anemia   . BPH (benign prostatic hyperplasia)   . Cataracts, bilateral   . CVA (cerebral vascular accident) (HCC)   . DVT (deep venous thrombosis) (HCC)    dvt in left leg  . Dyslipidemia   . Gout   . Gout   . HTN (hypertension)   . Left hip pain 03/15/2020  . Left leg pain   . Osteoarthritis   . PAD (peripheral artery disease) (HCC)   . Stasis dermatitis   . Stroke (HCC)   . Vitamin D deficiency      Family History  Problem Relation Age of Onset  . Cancer Mother   . Cancer Father   . Alcohol abuse Father   . Breast cancer Daughter   . Stroke Daughter   . CVA Daughter      Current Outpatient Medications:  .  amLODipine (NORVASC) 5 MG tablet, TAKE ONE TABLET BY MOUTH DAILY, Disp: 90 tablet, Rfl: 1 .  atorvastatin (LIPITOR) 80 MG tablet, TAKE 1 TABLET ONE TIME DAILY, Disp: 5 tablet, Rfl: 0 .  chlorthalidone (HYGROTON) 25 MG tablet, Take 1 tablet (25 mg total) by mouth daily., Disp: 5 tablet, Rfl: 0 .  Cholecalciferol (VITAMIN D3) 5000 units CAPS, Take 5,000 Units by mouth daily., Disp: , Rfl:  .  clopidogrel (PLAVIX) 75 MG tablet, TAKE ONE TABLET BY MOUTH DAILY, Disp: 90 tablet, Rfl: 2 .  diclofenac  Sodium (VOLTAREN) 1 % GEL, APPLY TWO GRAMS TOPICALLY FOUR TIMES A DAY, Disp: 100 g, Rfl: 0 .  Elastic Bandages & Supports (MEDICAL COMPRESSION STOCKINGS) MISC, , Disp: , Rfl:  .  furosemide (LASIX) 20 MG tablet, TAKE ONE TABLET BY MOUTH DAILY, Disp: 90 tablet, Rfl: 1 .  gabapentin (NEURONTIN) 300 MG capsule, TAKE ONE CAPSULE BY MOUTH AT BEDTIME, Disp: 90 capsule, Rfl: 0 .  isosorbide-hydrALAZINE (BIDIL) 20-37.5 MG tablet, Take 1 tablet by mouth daily, Disp: 5 tablet, Rfl: 0 .  ketorolac (ACULAR) 0.5 % ophthalmic solution, ketorolac 0.5 % eye drops, Disp: , Rfl:  .  Menthol-Methyl Salicylate (MUSCLE RUB) 10-15 % CREA, muscle rub, Disp: , Rfl:  .  methimazole (TAPAZOLE) 10 MG tablet, TAKE ONE TABLET BY MOUTH DAILY, Disp: 30 tablet, Rfl: 0 .  Multiple Vitamins-Minerals (COMPLETE SENIOR PO), 1 tablet daily. , Disp: , Rfl:  .  Omega-3 Fatty Acids (FISH OIL PO), Take 1,000 mg by mouth. , Disp: , Rfl:  .  tamsulosin (FLOMAX) 0.4 MG CAPS capsule, TAKE ONE CAPSULE BY MOUTH DAILY HALF HOUR FOLLOWING THE SAME MEAL DAILY, Disp: 90 capsule, Rfl: 0 .  VASCEPA 1 g capsule, TAKE ONE CAPSULE BY MOUTH TWICE A DAY, Disp: 60 capsule, Rfl: 0 .  vitamin C (ASCORBIC ACID) 500 MG tablet, Take 500 mg daily by mouth., Disp: , Rfl:  .  donepezil (ARICEPT) 5 MG tablet, Take 1 tablet (5 mg total) by mouth at bedtime., Disp: 90 tablet, Rfl: 0   Allergies  Allergen Reactions  . Shellfish Allergy Other (See Comments)    Gout      Review of Systems  Constitutional: Negative.   Respiratory: Negative.   Cardiovascular: Negative.  Negative for chest pain, palpitations and leg swelling.  Psychiatric/Behavioral: Negative.      Today's Vitals   09/07/20 1441  BP: 122/78  Pulse: 83  Temp: 99 F (37.2 C)  TempSrc: Oral  Weight: 240 lb 3.2 oz (109 kg)  Height: 5' 7.8" (1.722 m)  PainSc: 0-No pain   Body mass index is 36.74 kg/m.   Objective:  Physical Exam Constitutional:      Appearance: Normal appearance.   Cardiovascular:     Rate and Rhythm: Normal rate.     Pulses: Normal pulses.     Heart sounds: Normal heart sounds. No murmur heard.   Pulmonary:     Effort: Pulmonary effort is normal. No respiratory distress.     Breath sounds: Normal breath sounds.  Musculoskeletal:     Comments: Using a cane  Neurological:     General: No focal deficit present.     Mental Status: He is alert and oriented to person, place, and time.     Cranial Nerves: No cranial nerve deficit.  Psychiatric:        Mood and Affect: Mood normal.        Behavior: Behavior normal.        Thought Content: Thought content normal.        Judgment: Judgment normal.         Assessment And Plan:     1. Mild cognitive impairment  His labs are normal, I will start him on donepezil 5mg  at bedtime  Discussed side effects of nausea and lightheaded (hypotension)  He is to call the office with his dates of his covid vaccine  Will have him to follow up in 8 weeks to see how the medication is doing - donepezil (ARICEPT) 5 MG tablet; Take 1 tablet (5 mg total) by mouth at bedtime.  Dispense: 90 tablet; Refill: 0   Discussed his lab results during visit  Patient was given opportunity to ask questions. Patient verbalized understanding of the plan and was able to repeat key elements of the plan. All questions were answered to their satisfaction.    , FNP, have reviewed all documentation for this visit. The documentation on 09/07/20 for the exam, diagnosis, procedures, and orders are all accurate and complete.  THE PATIENT IS ENCOURAGED TO PRACTICE SOCIAL DISTANCING DUE TO THE COVID-19 PANDEMIC.

## 2020-09-24 DIAGNOSIS — I10 Essential (primary) hypertension: Secondary | ICD-10-CM | POA: Diagnosis not present

## 2020-09-28 DIAGNOSIS — H52209 Unspecified astigmatism, unspecified eye: Secondary | ICD-10-CM | POA: Diagnosis not present

## 2020-09-28 DIAGNOSIS — H5203 Hypermetropia, bilateral: Secondary | ICD-10-CM | POA: Diagnosis not present

## 2020-09-28 DIAGNOSIS — H524 Presbyopia: Secondary | ICD-10-CM | POA: Diagnosis not present

## 2020-10-11 ENCOUNTER — Other Ambulatory Visit: Payer: Self-pay | Admitting: Nurse Practitioner

## 2020-10-11 DIAGNOSIS — M25552 Pain in left hip: Secondary | ICD-10-CM

## 2020-10-11 DIAGNOSIS — M25562 Pain in left knee: Secondary | ICD-10-CM

## 2020-10-11 DIAGNOSIS — G8929 Other chronic pain: Secondary | ICD-10-CM

## 2020-10-24 DIAGNOSIS — I10 Essential (primary) hypertension: Secondary | ICD-10-CM | POA: Diagnosis not present

## 2020-10-28 ENCOUNTER — Other Ambulatory Visit: Payer: Self-pay | Admitting: Cardiology

## 2020-10-28 DIAGNOSIS — E781 Pure hyperglyceridemia: Secondary | ICD-10-CM

## 2020-10-31 ENCOUNTER — Telehealth: Payer: Medicare HMO

## 2020-11-02 ENCOUNTER — Ambulatory Visit (INDEPENDENT_AMBULATORY_CARE_PROVIDER_SITE_OTHER): Payer: Medicare HMO | Admitting: Nurse Practitioner

## 2020-11-02 ENCOUNTER — Encounter: Payer: Self-pay | Admitting: Nurse Practitioner

## 2020-11-02 ENCOUNTER — Other Ambulatory Visit: Payer: Self-pay

## 2020-11-02 VITALS — BP 142/76 | HR 76 | Temp 98.1°F | Ht 67.8 in | Wt 242.0 lb

## 2020-11-02 DIAGNOSIS — M25562 Pain in left knee: Secondary | ICD-10-CM

## 2020-11-02 DIAGNOSIS — R413 Other amnesia: Secondary | ICD-10-CM

## 2020-11-02 DIAGNOSIS — G8929 Other chronic pain: Secondary | ICD-10-CM | POA: Diagnosis not present

## 2020-11-02 MED ORDER — KETOROLAC TROMETHAMINE 30 MG/ML IJ SOLN
30.0000 mg | Freq: Once | INTRAMUSCULAR | Status: AC
  Administered 2020-11-02: 30 mg via INTRAMUSCULAR

## 2020-11-02 NOTE — Patient Instructions (Signed)

## 2020-11-02 NOTE — Progress Notes (Signed)
I,Jesus Lam as a Neurosurgeon for [employer], FNP.,have documented all relevant documentation on the behalf of Jesus Felts, FNP,as directed by  Jesus Felts, FNP while in the presence of Jesus Felts, FNP. This visit occurred during the SARS-CoV-2 public health emergency.  Safety protocols were in place, including screening questions prior to the visit, additional usage of staff PPE, and extensive cleaning of exam room while observing appropriate contact time as indicated for disinfecting solutions.  Subjective:     Patient ID: Jesus Lam , male    DOB: Apr 15, 1933 , 86 y.o.   MRN: [id number]   Chief Complaint  Patient presents with  . Knee Pain    Patient stated his left knee has been hurting for a long time.    HPI  He had been going to [facility] he had been getting injections but was told could not get them anymore.  He has not had any injections to his knee in over one year. Denies fall. He has been swimming 3 times a week.    He reports he has been having problems remembering and being able to speak at times.  He feels like the more stress he is under it is worse.   Knee Pain  The incident occurred more than 1 week ago (2 weeks ago). Pain location: left knee. The quality of the pain is described as aching. The pain is at a severity of 8/10. Pertinent negatives include no inability to bear weight, numbness or tingling. Treatments tried: pain cream.     Past Medical History:  Diagnosis Date  . Anemia   . BPH (benign prostatic hyperplasia)   . Cataracts, bilateral   . CVA (cerebral vascular accident) (HCC)   . DVT (deep venous thrombosis) (HCC)    dvt in left leg  . Dyslipidemia   . Gout   . Gout   . HTN (hypertension)   . Left hip pain 03/15/2020  . Left leg pain   . Osteoarthritis   . PAD (peripheral artery disease) (HCC)   . Stasis dermatitis   . Stroke (HCC)   . Vitamin D deficiency      Family History  Problem Relation Age of Onset  . Cancer  Mother   . Cancer Father   . Alcohol abuse Father   . Breast cancer Daughter   . Stroke Daughter   . CVA Daughter      Current Outpatient Medications:  .  amLODipine (NORVASC) 5 MG tablet, TAKE ONE TABLET BY MOUTH DAILY, Disp: 90 tablet, Rfl: 1 .  chlorthalidone (HYGROTON) 25 MG tablet, Take 1 tablet (25 mg total) by mouth daily., Disp: 5 tablet, Rfl: 0 .  Cholecalciferol (VITAMIN D3) 5000 units CAPS, Take 5,000 Units by mouth daily., Disp: , Rfl:  .  clopidogrel (PLAVIX) 75 MG tablet, TAKE ONE TABLET BY MOUTH DAILY, Disp: 90 tablet, Rfl: 2 .  diclofenac Sodium (VOLTAREN) 1 % GEL, APPLY TWO GRAMS TOPICALLY FOUR TIMES A DAY, Disp: 100 g, Rfl: 0 .  donepezil (ARICEPT) 5 MG tablet, Take 1 tablet (5 mg total) by mouth at bedtime., Disp: 90 tablet, Rfl: 0 .  Elastic Bandages & Supports (MEDICAL COMPRESSION STOCKINGS) MISC, , Disp: , Rfl:  .  furosemide (LASIX) 20 MG tablet, TAKE ONE TABLET BY MOUTH DAILY, Disp: 90 tablet, Rfl: 1 .  gabapentin (NEURONTIN) 300 MG capsule, TAKE ONE CAPSULE BY MOUTH AT BEDTIME, Disp: 90 capsule, Rfl: 0 .  isosorbide-hydrALAZINE (BIDIL) 20-37.5 MG tablet, Take 1 tablet  by mouth daily, Disp: 5 tablet, Rfl: 0 .  ketorolac (ACULAR) 0.5 % ophthalmic solution, ketorolac 0.5 % eye drops, Disp: , Rfl:  .  Menthol-Methyl Salicylate (MUSCLE RUB) 10-15 % CREA, muscle rub, Disp: , Rfl:  .  methimazole (TAPAZOLE) 10 MG tablet, TAKE ONE TABLET BY MOUTH DAILY, Disp: 30 tablet, Rfl: 0 .  Multiple Vitamins-Minerals (COMPLETE SENIOR PO), 1 tablet daily. , Disp: , Rfl:  .  tamsulosin (FLOMAX) 0.4 MG CAPS capsule, TAKE ONE CAPSULE BY MOUTH DAILY HALF HOUR FOLLOWING THE SAME MEAL DAILY, Disp: 90 capsule, Rfl: 0 .  VASCEPA 1 g capsule, TAKE ONE CAPSULE BY MOUTH TWICE A DAY, Disp: 60 capsule, Rfl: 0 .  vitamin C (ASCORBIC ACID) 500 MG tablet, Take 500 mg daily by mouth., Disp: , Rfl:  .  atorvastatin (LIPITOR) 80 MG tablet, TAKE 1 TABLET ONE TIME DAILY (Patient not taking: Reported on  11/02/2020), Disp: 5 tablet, Rfl: 0 .  Omega-3 Fatty Acids (FISH OIL PO), Take 1,000 mg by mouth.  (Patient not taking: Reported on 11/02/2020), Disp: , Rfl:    Allergies  Allergen Reactions  . Shellfish Allergy Other (See Comments)    Gout      Review of Systems  Musculoskeletal: Positive for arthralgias (left knee).  Neurological: Negative for dizziness, tingling and numbness.  Psychiatric/Behavioral: Negative.      Today's Vitals   11/02/20 1227  BP: (!) 142/76  Pulse: 76  Temp: 98.1 F (36.7 C)  TempSrc: Oral  Weight: 242 lb (109.8 kg)  Height: 5' 7.8" (1.722 m)  PainSc: 5    Body mass index is 37.01 kg/m.   Objective:  Physical Exam Constitutional:      General: He is not in acute distress.    Appearance: Normal appearance. He is obese.  Cardiovascular:     Pulses: Normal pulses.     Heart sounds: Normal heart sounds. No murmur heard.   Pulmonary:     Effort: Pulmonary effort is normal. No respiratory distress.     Breath sounds: Normal breath sounds.  Musculoskeletal:        General: Tenderness (left knee mostly medial ) present. No swelling or deformity.     Comments: Using a walking stick/cane  Skin:    General: Skin is warm and dry.  Neurological:     General: No focal deficit present.     Mental Status: He is alert and oriented to person, place, and time.     Cranial Nerves: No cranial nerve deficit.  Psychiatric:        Mood and Affect: Mood normal.        Behavior: Behavior normal.        Thought Content: Thought content normal.        Judgment: Judgment normal.         Assessment And Plan:     1. Chronic pain of left knee  He would like a referral to orthopedics, has had several injections done at [facility]  Pain is worsening and causing him problems with walking - Ambulatory referral to Orthopedic Surgery - ketorolac (TORADOL) 30 MG/ML injection 30 mg  2. Memory changes  He feels his memory is not getting better and is becoming  frustrated and difficulty with word finding. He does have a reported stroke in the past but he feels this is new. Will check CT scan of head - CT Head Wo Contrast; Future     Patient was given opportunity to ask questions. Patient verbalized  understanding of the plan and was able to repeat key elements of the plan. All questions were answered to their satisfaction.    Jeanell Sparrow, FNP, have reviewed all documentation for this visit. The documentation on 11/06/20 for the exam, diagnosis, procedures, and orders are all accurate and complete.  THE PATIENT IS ENCOURAGED TO PRACTICE SOCIAL DISTANCING DUE TO THE COVID-19 PANDEMIC.

## 2020-11-08 ENCOUNTER — Other Ambulatory Visit: Payer: Self-pay | Admitting: Nurse Practitioner

## 2020-11-08 DIAGNOSIS — E0590 Thyrotoxicosis, unspecified without thyrotoxic crisis or storm: Secondary | ICD-10-CM

## 2020-11-13 ENCOUNTER — Ambulatory Visit (INDEPENDENT_AMBULATORY_CARE_PROVIDER_SITE_OTHER): Payer: Medicare HMO | Admitting: Nurse Practitioner

## 2020-11-13 ENCOUNTER — Encounter: Payer: Self-pay | Admitting: Nurse Practitioner

## 2020-11-13 ENCOUNTER — Other Ambulatory Visit: Payer: Self-pay

## 2020-11-13 VITALS — BP 152/76 | HR 61 | Temp 98.6°F | Ht 67.8 in | Wt 245.6 lb

## 2020-11-13 DIAGNOSIS — E782 Mixed hyperlipidemia: Secondary | ICD-10-CM | POA: Diagnosis not present

## 2020-11-13 DIAGNOSIS — I10 Essential (primary) hypertension: Secondary | ICD-10-CM | POA: Diagnosis not present

## 2020-11-13 DIAGNOSIS — Z23 Encounter for immunization: Secondary | ICD-10-CM

## 2020-11-13 MED ORDER — PNEUMOCOCCAL 13-VAL CONJ VACC IM SUSP: 0.5000 mL | INTRAMUSCULAR | 0 refills | Status: AC

## 2020-11-13 NOTE — Progress Notes (Signed)
This visit occurred during the SARS-CoV-2 public health emergency.  Safety protocols were in place, including screening questions prior to the visit, additional usage of staff PPE, and extensive cleaning of exam room while observing appropriate contact time as indicated for disinfecting solutions.  Subjective:     Patient ID: Jesus Lam , male    DOB: Aug 24, 1933 , 86 y.o.   MRN: [id number]   Chief Complaint  Patient presents with  . Hypertension  . Hyperlipidemia    HPI  Here for blood pressure and cholesterol follow up. He was recently seen for his memory changes, tolerating medications well.  He has a CT scan of the head scheduled in January 2022 Wt Readings from Last 3 Encounters: 11/13/20 : 245 lb 9.6 oz (111.4 kg) 11/02/20 : 242 lb (109.8 kg) 09/07/20 : 240 lb 3.2 oz (109 kg)     Past Medical History:  Diagnosis Date  . Anemia   . BPH (benign prostatic hyperplasia)   . Cataracts, bilateral   . CVA (cerebral vascular accident) ([place])   . DVT (deep venous thrombosis) ([facility])    dvt in left leg  . Dyslipidemia   . Gout   . Gout   . HTN (hypertension)   . Left hip pain 03/15/2020  . Left leg pain   . Osteoarthritis   . PAD (peripheral artery disease) ([place])   . Stasis dermatitis   . Stroke ([place])   . Vitamin D deficiency      Family History  Problem Relation Age of Onset  . Cancer Mother   . Cancer Father   . Alcohol abuse Father   . Breast cancer Daughter   . Stroke Daughter   . CVA Daughter      Current Outpatient Medications:  .  amLODipine (NORVASC) 5 MG tablet, TAKE ONE TABLET BY MOUTH DAILY, Disp: 90 tablet, Rfl: 1 .  chlorthalidone (HYGROTON) 25 MG tablet, Take 1 tablet (25 mg total) by mouth daily., Disp: 5 tablet, Rfl: 0 .  Cholecalciferol (VITAMIN D3) 5000 units CAPS, Take 5,000 Units by mouth daily., Disp: , Rfl:  .  clopidogrel (PLAVIX) 75 MG tablet, TAKE ONE TABLET BY MOUTH DAILY, Disp: 90 tablet, Rfl: 2 .  diclofenac Sodium (VOLTAREN) 1 % GEL,  APPLY TWO GRAMS TOPICALLY FOUR TIMES A DAY, Disp: 100 g, Rfl: 0 .  donepezil (ARICEPT) 5 MG tablet, Take 1 tablet (5 mg total) by mouth at bedtime., Disp: 90 tablet, Rfl: 0 .  Elastic Bandages & Supports (MEDICAL COMPRESSION STOCKINGS) MISC, , Disp: , Rfl:  .  furosemide (LASIX) 20 MG tablet, TAKE ONE TABLET BY MOUTH DAILY, Disp: 90 tablet, Rfl: 1 .  gabapentin (NEURONTIN) 300 MG capsule, TAKE ONE CAPSULE BY MOUTH AT BEDTIME, Disp: 90 capsule, Rfl: 0 .  isosorbide-hydrALAZINE (BIDIL) 20-37.5 MG tablet, Take 1 tablet by mouth daily, Disp: 5 tablet, Rfl: 0 .  ketorolac (ACULAR) 0.5 % ophthalmic solution, ketorolac 0.5 % eye drops, Disp: , Rfl:  .  Menthol-Methyl Salicylate (MUSCLE RUB) 10-15 % CREA, muscle rub, Disp: , Rfl:  .  methimazole (TAPAZOLE) 10 MG tablet, TAKE ONE TABLET BY MOUTH DAILY, Disp: 30 tablet, Rfl: 0 .  Multiple Vitamins-Minerals (COMPLETE SENIOR PO), 1 tablet daily. , Disp: , Rfl:  .  tamsulosin (FLOMAX) 0.4 MG CAPS capsule, TAKE ONE CAPSULE BY MOUTH DAILY HALF HOUR FOLLOWING THE SAME MEAL DAILY, Disp: 90 capsule, Rfl: 0 .  VASCEPA 1 g capsule, TAKE ONE CAPSULE BY MOUTH TWICE A DAY, Disp: 60 capsule,  Rfl: 0 .  vitamin C (ASCORBIC ACID) 500 MG tablet, Take 500 mg daily by mouth., Disp: , Rfl:  .  atorvastatin (LIPITOR) 80 MG tablet, TAKE 1 TABLET ONE TIME DAILY (Patient not taking: No sig reported), Disp: 5 tablet, Rfl: 0 .  Omega-3 Fatty Acids (FISH OIL PO), Take 1,000 mg by mouth.  (Patient not taking: No sig reported), Disp: , Rfl:  .  pneumococcal 13-valent conjugate vaccine (PREVNAR 13) SUSP injection, Inject 0.5 mLs into the muscle tomorrow at 10 am for 1 dose., Disp: 0.5 mL, Rfl: 0   Allergies  Allergen Reactions  . Shellfish Allergy Other (See Comments)    Gout      Review of Systems  Constitutional: Negative.  Negative for fatigue.  Respiratory: Negative.   Cardiovascular: Negative for chest pain, palpitations and leg swelling.  Neurological: Negative for  headaches.  Psychiatric/Behavioral: Negative.      Today's Vitals   11/13/20 1403  BP: (!) 152/76  Pulse: 61  Temp: 98.6 F (37 C)  TempSrc: Oral  Weight: 245 lb 9.6 oz (111.4 kg)  Height: 5' 7.8" (1.722 m)  PainSc: 0-No pain   Body mass index is 37.56 kg/m.   Objective:  Physical Exam Vitals reviewed.  Constitutional:      General: He is not in acute distress.    Appearance: Normal appearance. He is obese.  Cardiovascular:     Rate and Rhythm: Normal rate and regular rhythm.     Pulses: Normal pulses.     Heart sounds: Normal heart sounds. No murmur heard.   Pulmonary:     Effort: Pulmonary effort is normal. No respiratory distress.     Breath sounds: Normal breath sounds. No wheezing.  Neurological:     General: No focal deficit present.     Mental Status: He is alert and oriented to person, place, and time.     Cranial Nerves: No cranial nerve deficit.  Psychiatric:        Mood and Affect: Mood normal.        Behavior: Behavior normal.        Thought Content: Thought content normal.        Judgment: Judgment normal.         Assessment And Plan:     1. Essential hypertension . B/P is controlled.  . CMP ordered to check renal function.  . The importance of regular exercise and dietary modification was stressed to the patient.  . Encouraged to avoid high salt foods - CMP14+EGFR  2. Mixed hyperlipidemia  Chronic, controlled  Continue with current medications, tolerating medications well - CMP14+EGFR - Lipid panel  3. Encounter for immunization - pneumococcal 13-valent conjugate vaccine (PREVNAR 13) SUSP injection; Inject 0.5 mLs into the muscle tomorrow at 10 am for 1 dose.  Dispense: 0.5 mL; Refill: 0     Patient was given opportunity to ask questions. Patient verbalized understanding of the plan and was able to repeat key elements of the plan. All questions were answered to their satisfaction.  Minette Brine, FNP   I, Minette Brine, FNP, have  reviewed all documentation for this visit. The documentation on 11/13/20 for the exam, diagnosis, procedures, and orders are all accurate and complete.  THE PATIENT IS ENCOURAGED TO PRACTICE SOCIAL DISTANCING DUE TO THE COVID-19 PANDEMIC.

## 2020-11-14 ENCOUNTER — Telehealth: Payer: Medicare HMO

## 2020-11-14 ENCOUNTER — Ambulatory Visit: Payer: Self-pay

## 2020-11-14 DIAGNOSIS — M25562 Pain in left knee: Secondary | ICD-10-CM

## 2020-11-14 DIAGNOSIS — G3184 Mild cognitive impairment, so stated: Secondary | ICD-10-CM

## 2020-11-14 DIAGNOSIS — E7800 Pure hypercholesterolemia, unspecified: Secondary | ICD-10-CM

## 2020-11-14 DIAGNOSIS — E782 Mixed hyperlipidemia: Secondary | ICD-10-CM

## 2020-11-14 DIAGNOSIS — R413 Other amnesia: Secondary | ICD-10-CM

## 2020-11-14 DIAGNOSIS — I10 Essential (primary) hypertension: Secondary | ICD-10-CM

## 2020-11-15 ENCOUNTER — Ambulatory Visit: Payer: Medicare HMO | Attending: Critical Care Medicine

## 2020-11-15 ENCOUNTER — Ambulatory Visit: Payer: Medicare HMO | Admitting: *Deleted

## 2020-11-15 DIAGNOSIS — Z23 Encounter for immunization: Secondary | ICD-10-CM

## 2020-11-15 NOTE — Progress Notes (Signed)
   Covid-19 Vaccination Clinic  Name:  Jesus Lam    MRN: [id number] DOB: 02/02/1933  11/16/2020  [name] was observed post Covid-19 immunization for 15 minutes without incident. He was provided with Vaccine Information Sheet and instruction to access the V-Safe system.   [name] was instructed to call 911 with any severe reactions post vaccine: Marland Kitchen Difficulty breathing  . Swelling of face and throat  . A fast heartbeat  . A bad rash all over body  . Dizziness and weakness

## 2020-11-15 NOTE — Progress Notes (Signed)
Patient presents for vaccine injection today. Patient tolerated injection well and was observed without any concerns.  

## 2020-11-16 NOTE — Patient Instructions (Signed)
Visit Information  Goals Addressed      Patient Stated   .  "I would like to learn what foods make my Triglycerides too high" (pt-stated)   On track     CARE PLAN ENTRY (see longitudinal plan of care for additional care plan information)  Current Barriers:  Marland Kitchen Knowledge Deficits related to disease process and Self Health management of high Triglycerides . Chronic Disease Management support and education needs related to HTN, Hyperthyroidism, Altered memory,  Bilateral hand pain, Medication management, Pure hypercholesterolemia, Chronic pain of left knee   Nurse Case Manager Clinical Goal(s):  . 08/08/20 New Over the next 180 days, patient will work with CCM RN CM and PCP to address needs related to disease education and support to help lower Triglycerides  CCM RN CM Interventions:  11/14/20 call completed with patient's daughter [name] . Inter-disciplinary care team collaboration (see longitudinal plan of care) . Evaluation of current treatment plan related to elevated Triglycerides and patient's adherence to plan as established by provider . Educated daughter re: causes for elevated Triglycerides; Educated on dietary and exercise recommendations; Determined patient swims at the [facility] at least 3 days per week . Confirmed with daughter, patient continues to take his cholesterol medication exactly as prescribed . Discussed plans with patient for ongoing care management follow up and provided patient with direct contact information for care management team  Patient Self Care Activities:  . Self administer medications as prescribed . Attend all scheduled provider appointments . Call pharmacy for medication refills . Performs ADL's independently . Performs IADL's independently . Call provider office for new concerns or questions  Please see past updates related to this goal by clicking on the "Past Updates" button in the selected goal        Other   .  Evaluate and treat memory loss         Current Barriers:  Marland Kitchen Knowledge Deficits related to evaluation and treatment for worsening memory loss   . Chronic Disease Management support and education needs related to HTN, Hyperthyroidism, Altered memory,  Bilateral hand pain, Medication management, Pure hypercholesterolemia, Chronic pain of left knee  . Cognitive Deficits  Nurse Case Manager Clinical Goal(s):  Marland Kitchen Over the next 90 days, patient will work with the CCM team and PCP to address needs related to evaluation and treatment of worsening memory loss   Interventions:  . 1:1 collaboration with [name], FNP regarding development and update of comprehensive plan of care as evidenced by provider attestation and co-signature . Inter-disciplinary care team collaboration (see longitudinal plan of care) . Evaluation of current treatment plan related to worsening memory loss  and patient's adherence to plan as established by provider. . Provided education to patient re: treatment recommendations per PCP for CT scan; Educated on disease process, diagnosis and treatment for Dementia   . Reviewed medications with patient and discussed patient is adhering to taking his prescribed medications exactly as prescribed including Aricept; determined daughter finds this medication to be uneffective and mood changing for patient  . Discussed plans with patient for ongoing care management follow up and provided patient with direct contact information for care management team . Reviewed scheduled/upcoming provider appointments including: CT scan scheduled for 11/30/20 @12 :40 pm   Patient Goals/Self-Care Activities Over the next 90 days, patient will:  - Patient will self administer medications as prescribed Patient will attend all scheduled provider appointments Patient will call pharmacy for medication refills Patient will continue to perform ADL's independently Patient will  continue to perform IADL's independently Patient will call provider  office for new concerns or questions  Follow Up Plan: Telephone follow up appointment with care management team member scheduled for: 12/14/20       The patient verbalized understanding of instructions, educational materials, and care plan provided today and declined offer to receive copy of patient instructions, educational materials, and care plan.   Telephone follow up appointment with care management team member scheduled for: 12/14/20  Riley Churches, RN

## 2020-11-16 NOTE — Chronic Care Management (AMB) (Signed)
Chronic Care Management   Follow Up Note   11/13/2020 Name: Jesus Lam MRN: [id number] DOB: 1932/12/13  Referred by: [name], FNP Reason for referral : Chronic Care Management (RN CM FU Call )   Jesus Lam is a 86 y.o. year old male who is a primary care patient of [name], FNP. The CCM team was consulted for assistance with chronic disease management and care coordination needs.    Review of patient status, including review of consultants reports, relevant laboratory and other test results, and collaboration with appropriate care team members and the patient's provider was performed as part of comprehensive patient evaluation and provision of chronic care management services.    SDOH (Social Determinants of Health) assessments performed: Yes - no acute challenges identified See Care Plan activities for detailed interventions related to SDOH)   Placed outbound CCM RN CM f/u call with daughter Jesus Lam.    Outpatient Encounter Medications as of 11/14/2020  Medication Sig  . amLODipine (NORVASC) 5 MG tablet TAKE ONE TABLET BY MOUTH DAILY  . atorvastatin (LIPITOR) 80 MG tablet TAKE 1 TABLET ONE TIME DAILY (Patient not taking: No sig reported)  . chlorthalidone (HYGROTON) 25 MG tablet Take 1 tablet (25 mg total) by mouth daily.  . Cholecalciferol (VITAMIN D3) 5000 units CAPS Take 5,000 Units by mouth daily.  . clopidogrel (PLAVIX) 75 MG tablet TAKE ONE TABLET BY MOUTH DAILY  . diclofenac Sodium (VOLTAREN) 1 % GEL APPLY TWO GRAMS TOPICALLY FOUR TIMES A DAY  . donepezil (ARICEPT) 5 MG tablet Take 1 tablet (5 mg total) by mouth at bedtime.  [name] (MEDICAL COMPRESSION STOCKINGS) MISC   . furosemide (LASIX) 20 MG tablet TAKE ONE TABLET BY MOUTH DAILY  . gabapentin (NEURONTIN) 300 MG capsule TAKE ONE CAPSULE BY MOUTH AT BEDTIME  . isosorbide-hydrALAZINE (BIDIL) 20-37.5 MG tablet Take 1 tablet by mouth daily  . ketorolac (ACULAR) 0.5 % ophthalmic  solution ketorolac 0.5 % eye drops  . Menthol-Methyl Salicylate (MUSCLE RUB) 10-15 % CREA muscle rub  . methimazole (TAPAZOLE) 10 MG tablet TAKE ONE TABLET BY MOUTH DAILY  . Multiple Vitamins-Minerals (COMPLETE SENIOR PO) 1 tablet daily.   . Omega-3 Fatty Acids (FISH OIL PO) Take 1,000 mg by mouth.  (Patient not taking: No sig reported)  . tamsulosin (FLOMAX) 0.4 MG CAPS capsule TAKE ONE CAPSULE BY MOUTH DAILY HALF HOUR FOLLOWING THE SAME MEAL DAILY  . VASCEPA 1 g capsule TAKE ONE CAPSULE BY MOUTH TWICE A DAY  . vitamin C (ASCORBIC ACID) 500 MG tablet Take 500 mg daily by mouth.   No facility-administered encounter medications on file as of 11/14/2020.     Objective:  Lab Results  Component Value Date   HGBA1C 5.1 02/03/2020   HGBA1C 5.3 06/13/2018   Lab Results  Component Value Date   MICROALBUR 30 02/03/2020   LDLCALC 76 08/03/2020   CREATININE 1.19 08/03/2020   BP Readings from Last 3 Encounters:  11/13/20 (!) 152/76  11/02/20 (!) 142/76  09/07/20 122/78    Goals Addressed      Patient Stated   .  "I would like to learn what foods make my Triglycerides too high" (pt-stated)   On track     CARE PLAN ENTRY (see longitudinal plan of care for additional care plan information)  Current Barriers:  Marland Kitchen Knowledge Deficits related to disease process and Self Health management of high Triglycerides . Chronic Disease Management support and education needs related to HTN, Hyperthyroidism, Altered  memory,  Bilateral hand pain, Medication management, Pure hypercholesterolemia, Chronic pain of left knee   Nurse Case Manager Clinical Goal(s):  . 08/08/20 New Over the next 180 days, patient will work with CCM RN CM and PCP to address needs related to disease education and support to help lower Triglycerides  CCM RN CM Interventions:  11/14/20 call completed with patient's daughter Jesus Lam . Inter-disciplinary care team collaboration (see longitudinal plan of care) . Evaluation of  current treatment plan related to elevated Triglycerides and patient's adherence to plan as established by provider . Educated daughter re: causes for elevated Triglycerides; Educated on dietary and exercise recommendations; Determined patient swim[facility] at the [facility] at least 3 days per week . Confirmed with daughter, patient continues to take his cholesterol medication exactly as prescribed . Discussed plans with patient for ongoing care management follow up and provided patient with direct contact information for care management team  Patient Self Care Activities:  . Self administer medications as prescribed . Attend all scheduled provider appointments . Call pharmacy for medication refills . Performs ADL's independently . Performs IADL's independently . Call provider office for new concerns or questions  Please see past updates related to this goal by clicking on the "Past Updates" button in the selected goal        Other   .  Evaluate and treat memory loss        Current Barriers:  Marland Kitchen Knowledge Deficits related to evaluation and treatment for worsening memory loss   . Chronic Disease Management support and education needs related to HTN, Hyperthyroidism, Altered memory,  Bilateral hand pain, Medication management, Pure hypercholesterolemia, Chronic pain of left knee  . Cognitive Deficits  Nurse Case Manager Clinical Goal(s):  Marland Kitchen Over the next 90 days, patient will work with the CCM team and PCP to address needs related to evaluation and treatment of worsening memory loss   Interventions:  . 1:1 collaboration with [name], FNP regarding development and update of comprehensive plan of care as evidenced by provider attestation and co-signature . Inter-disciplinary care team collaboration (see longitudinal plan of care) . Evaluation of current treatment plan related to worsening memory loss  and patient's adherence to plan as established by provider. . Provided education to patient re:  treatment recommendations per PCP for CT scan; Educated on disease process, diagnosis and treatment for Dementia   . Reviewed medications with patient and discussed patient is adhering to taking his prescribed medications exactly as prescribed including Aricept; determined daughter finds this medication to be uneffective and mood changing for patient  . Discussed plans with patient for ongoing care management follow up and provided patient with direct contact information for care management team . Reviewed scheduled/upcoming provider appointments including: CT scan scheduled for 11/30/20 @12 :40 pm   Patient Goals/Self-Care Activities Over the next 90 days, patient will:  - Patient will self administer medications as prescribed Patient will attend all scheduled provider appointments Patient will call pharmacy for medication refills Patient will continue to perform ADL's independently Patient will continue to perform IADL's independently Patient will call provider office for new concerns or questions  Follow Up Plan: Telephone follow up appointment with care management team member scheduled for:       There are no care plans to display for this patient.   Plan:   Telephone follow up appointment with care management team member scheduled for: 12/14/20   12/16/20, RN, BSN, CCM Care Management Coordinator [facility] Care Management/[facility]  Direct Phone: [phone]

## 2020-11-20 ENCOUNTER — Other Ambulatory Visit: Payer: Self-pay | Admitting: Nurse Practitioner

## 2020-11-24 DIAGNOSIS — I10 Essential (primary) hypertension: Secondary | ICD-10-CM | POA: Diagnosis not present

## 2020-11-30 ENCOUNTER — Other Ambulatory Visit: Payer: Self-pay | Admitting: Cardiology

## 2020-11-30 ENCOUNTER — Ambulatory Visit
Admission: RE | Admit: 2020-11-30 | Discharge: 2020-11-30 | Disposition: A | Discharge status: Home / Self Care | Payer: Medicare HMO | Source: Ambulatory Visit | Attending: Nurse Practitioner | Admitting: Nurse Practitioner

## 2020-11-30 DIAGNOSIS — E781 Pure hyperglyceridemia: Secondary | ICD-10-CM

## 2020-11-30 DIAGNOSIS — R413 Other amnesia: Secondary | ICD-10-CM | POA: Diagnosis not present

## 2020-12-05 ENCOUNTER — Other Ambulatory Visit: Payer: Self-pay | Admitting: Nurse Practitioner

## 2020-12-05 DIAGNOSIS — G3184 Mild cognitive impairment, so stated: Secondary | ICD-10-CM

## 2020-12-14 ENCOUNTER — Telehealth: Payer: Medicare HMO

## 2020-12-14 ENCOUNTER — Other Ambulatory Visit: Payer: Self-pay | Admitting: Cardiology

## 2020-12-14 DIAGNOSIS — E781 Pure hyperglyceridemia: Secondary | ICD-10-CM

## 2020-12-15 ENCOUNTER — Other Ambulatory Visit: Payer: Self-pay | Admitting: Nurse Practitioner

## 2020-12-15 DIAGNOSIS — E0590 Thyrotoxicosis, unspecified without thyrotoxic crisis or storm: Secondary | ICD-10-CM

## 2020-12-25 ENCOUNTER — Other Ambulatory Visit: Payer: Self-pay | Admitting: Nurse Practitioner

## 2020-12-25 DIAGNOSIS — M1712 Unilateral primary osteoarthritis, left knee: Secondary | ICD-10-CM | POA: Diagnosis not present

## 2020-12-25 DIAGNOSIS — I10 Essential (primary) hypertension: Secondary | ICD-10-CM | POA: Diagnosis not present

## 2021-01-10 ENCOUNTER — Encounter: Payer: Self-pay | Admitting: Cardiology

## 2021-01-10 ENCOUNTER — Other Ambulatory Visit: Payer: Self-pay | Admitting: Nurse Practitioner

## 2021-01-10 ENCOUNTER — Ambulatory Visit: Payer: Medicare HMO | Admitting: Cardiology

## 2021-01-10 ENCOUNTER — Other Ambulatory Visit: Payer: Self-pay

## 2021-01-10 ENCOUNTER — Other Ambulatory Visit: Payer: Self-pay | Admitting: Pharmacist

## 2021-01-10 VITALS — BP 143/76 | HR 73 | Temp 98.7°F | Resp 16 | Ht 67.0 in | Wt 236.0 lb

## 2021-01-10 DIAGNOSIS — I10 Essential (primary) hypertension: Secondary | ICD-10-CM

## 2021-01-10 NOTE — Progress Notes (Signed)
Subjective:   Jesus Lam, male    DOB: [birth year], 87 y.o.   MRN: [id number]   Chief complaint:  PAD   Hypertension Pertinent negatives include no chest pain or palpitations.    87 year old African-American male with severe bilateral PAD with class III claudication, now s/p successful DCB Rt SFA and 2 overlapping Zilver PTX stents placed left SFA (12/2017), s/p left temporal occipital cortical infarct (05/2018).  He has started swimming back and is enjoying it. He still has knee pain, he is hoping to undergo knee surgery soon.  He does have pain and cramps in his calves, but it actually improves after walking.  Blood pressure slightly elevated today, as it has been on his home blood pressure log.  He is confused about exactly what medications he takes.   Current Outpatient Medications on File Prior to Visit  Medication Sig Dispense Refill  . amLODipine (NORVASC) 5 MG tablet TAKE ONE TABLET BY MOUTH DAILY 90 tablet 1  . atorvastatin (LIPITOR) 80 MG tablet TAKE 1 TABLET ONE TIME DAILY 5 tablet 0  . chlorthalidone (HYGROTON) 25 MG tablet Take 1 tablet (25 mg total) by mouth daily. 5 tablet 0  . Cholecalciferol (VITAMIN D3) 5000 units CAPS Take 5,000 Units by mouth daily.    . clopidogrel (PLAVIX) 75 MG tablet TAKE ONE TABLET BY MOUTH DAILY 90 tablet 2  . diclofenac Sodium (VOLTAREN) 1 % GEL APPLY TWO GRAMS TOPICALLY FOUR TIMES A DAY 100 g 0  . donepezil (ARICEPT) 5 MG tablet TAKE 1 TABLET (5 MG TOTAL) BY MOUTH AT BEDTIME. 90 tablet 0  . Elastic Bandages & Supports (MEDICAL COMPRESSION STOCKINGS) MISC     . furosemide (LASIX) 20 MG tablet TAKE ONE TABLET BY MOUTH DAILY 90 tablet 0  . gabapentin (NEURONTIN) 300 MG capsule TAKE ONE CAPSULE BY MOUTH AT BEDTIME 90 capsule 0  . isosorbide-hydrALAZINE (BIDIL) 20-37.5 MG tablet Take 1 tablet by mouth daily 5 tablet 0  . ketorolac (ACULAR) 0.5 % ophthalmic solution ketorolac 0.5 % eye drops    . Menthol-Methyl Salicylate (MUSCLE RUB)  10-15 % CREA muscle rub    . methimazole (TAPAZOLE) 10 MG tablet TAKE ONE TABLET BY MOUTH DAILY 30 tablet 0  . Multiple Vitamins-Minerals (COMPLETE SENIOR PO) 1 tablet daily.     . Omega-3 Fatty Acids (FISH OIL PO) Take 1,000 mg by mouth.    . tamsulosin (FLOMAX) 0.4 MG CAPS capsule TAKE ONE CAPSULE BY MOUTH DAILY HALF HOUR FOLLOWING THE SAME MEAL DAILY 90 capsule 0  . VASCEPA 1 g capsule TAKE ONE CAPSULE BY MOUTH TWICE A DAY 60 capsule 0  . vitamin C (ASCORBIC ACID) 500 MG tablet Take 500 mg daily by mouth.     No current facility-administered medications on file prior to visit.    Cardiovascular studies:  EKG 01/10/2021: Sinus rhythm 74 bpm First degree A-V block  Left anterior fascicular block Anteroseptal infarct -age undetermined   ABI 02/05/2018: This exam reveals moderately decreased perfusion of the lower extremity, noted at  the post tibial artery level (ABI). Right ABI 0.79 and left ABI 0.71. Compared to  11/19/2017:   LABI 0.67 and RABI 0.59.  Periperal angiography 01/27/2018:  Right mid and distal SFA CTO, moderate calcification evident. Successful PTA and stenting of the CTO right mid and distal SFA with antegrade left femoral and retrograde right PT access and implantation of 2 overlapping 6.0 x 1 20 mm Zilver PTX drug-coated stents. Stenosis reduced from 100% to 0%.  Brisk flow was evident at the end of the procedure..  Echocardiogram 07/24/2018: 1. Left ventricle cavity is normal in size. Mild concentric hypertrophy of the left ventricle. Doppler evidence of grade I (impaired) diastolic dysfunction, normal LAP. Left ventricle regional wall motion findings: Basal and mid inferior hypokinesis. Calculated EF 46%. 2. Moderate aortic valve leaflet thickening. Mildly restricted aortic valve leaflets. No evidence of aortic valve stenosis. Mild (Grade I) aortic regurgitation. 3. Structurally normal mitral valve with mild (Grade I) regurgitation. 4. Compared to the study done on  09/20/2017, no significant change in LVEF. Wall motion appears to be new. Previously mitral regurgitation was mild to moderate.   Recent labs: 04/27/2020: Glucose 86, BUN/Cr 12/1.16. EGFR 66. Na/K 141/4.8. Rest of the CMP normal Chol 120, TG 165, HDL 34, LDL 58 TSH 0.9 normal  02/03/2020: Glucose 81, BUN/Cr 16/1.1. EGFR 70. Na/K 142/4.1. Rest of the CMP normal HbA1C 5.1% Chol 140, TG 253, HDL 31, LDL 68 TSH 8.5, elevated  12/29/2019: H/H 11.7/36.4. MCV 92. Platelets 194 Chol 108, TG 143, HDL 32, LDL 51   Review of Systems  Cardiovascular: Positive for claudication (Left leg). Negative for chest pain, dyspnea on exertion, leg swelling, palpitations and syncope.  Musculoskeletal: Positive for joint pain.       Vitals:   01/10/21 1330  BP: (!) 143/76  Pulse: 73  Resp: 16  Temp: 98.7 F (37.1 C)  SpO2: 98%     Body mass index is 36.96 kg/m. Filed Weights   01/10/21 1330  Weight: 236 lb (107 kg)      Objective:    Physical Exam Vitals and nursing note reviewed.  Constitutional:      Appearance: He is well-developed.  Neck:     Vascular: No JVD.  Cardiovascular:     Rate and Rhythm: Normal rate and regular rhythm.     Pulses: Decreased pulses.          Femoral pulses are 3+ on the right side and 3+ on the left side.      Popliteal pulses are 1+ on the right side and 1+ on the left side.       Dorsalis pedis pulses are 0 on the right side and 0 on the left side.       Posterior tibial pulses are 0 on the right side and 0 on the left side.     Heart sounds: Normal heart sounds. No murmur heard.   Pulmonary:     Effort: Pulmonary effort is normal.     Breath sounds: Normal breath sounds. No wheezing or rales.           Assessment & Recommendations:   87 year old African-American male with severe bilateral PAD with class III claudication, now s/p successful DCB Rt SFA and 2 overlapping Zilver PTX stents placed left SFA (12/2017), s/p left temporal  occipital cortical infarct (05/2018).  PAD: No lifestyle limiting claudication, or critical limb ischemia. Continue medical management.  Hypertension: Elevated today.  Recommend BiDil twice daily.   Continue rest of the antihypertensive therapy.   I will refer him to home health for medication assistance, given his underlying memory issues and confusion regarding medications.    Hypertriglyceridemia: Improved on lipitor and vascepa.  Preoperative stratification: No cardiac risk for surgery.  Plavix could be stopped 5 days before the surgery.    F/u in 6 months   Esther Hardy, MD [facility] Pager: [phone] Office: [phone] If no answer Cell [phone]

## 2021-01-11 ENCOUNTER — Other Ambulatory Visit: Payer: Self-pay | Admitting: Nurse Practitioner

## 2021-01-11 DIAGNOSIS — I10 Essential (primary) hypertension: Secondary | ICD-10-CM

## 2021-01-18 ENCOUNTER — Telehealth: Payer: Medicare HMO

## 2021-01-18 ENCOUNTER — Telehealth: Payer: Self-pay

## 2021-01-18 DIAGNOSIS — E785 Hyperlipidemia, unspecified: Secondary | ICD-10-CM | POA: Diagnosis not present

## 2021-01-18 DIAGNOSIS — R2681 Unsteadiness on feet: Secondary | ICD-10-CM | POA: Diagnosis not present

## 2021-01-18 DIAGNOSIS — I10 Essential (primary) hypertension: Secondary | ICD-10-CM | POA: Diagnosis not present

## 2021-01-18 DIAGNOSIS — Z95820 Peripheral vascular angioplasty status with implants and grafts: Secondary | ICD-10-CM | POA: Diagnosis not present

## 2021-01-18 DIAGNOSIS — I70203 Unspecified atherosclerosis of native arteries of extremities, bilateral legs: Secondary | ICD-10-CM | POA: Diagnosis not present

## 2021-01-18 NOTE — Telephone Encounter (Signed)
  Chronic Care Management   Outreach Note  01/18/2021 Name: Jesus Lam MRN: [id number] DOB: 01/15/1933  Referred by: [name], FNP Reason for referral : Chronic Care Management (RN CM FU Call Attempt)   An unsuccessful telephone outreach was attempted today. I spoke with Mr. Schoenfeld briefly today, unfortunately, he is not available to speak with me at this time. The patient was referred to the case management team for assistance with care management and care coordination.   Follow Up Plan: Telephone follow up appointment with care management team member scheduled for: 02/20/21  [name], RN, BSN, CCM Care Management Coordinator [facility]  Direct Phone: [phone]

## 2021-01-22 ENCOUNTER — Encounter: Payer: Self-pay | Admitting: Cardiology

## 2021-01-22 DIAGNOSIS — E785 Hyperlipidemia, unspecified: Secondary | ICD-10-CM | POA: Diagnosis not present

## 2021-01-22 DIAGNOSIS — R2681 Unsteadiness on feet: Secondary | ICD-10-CM | POA: Diagnosis not present

## 2021-01-22 DIAGNOSIS — I70203 Unspecified atherosclerosis of native arteries of extremities, bilateral legs: Secondary | ICD-10-CM | POA: Diagnosis not present

## 2021-01-22 DIAGNOSIS — Z95820 Peripheral vascular angioplasty status with implants and grafts: Secondary | ICD-10-CM | POA: Diagnosis not present

## 2021-01-22 DIAGNOSIS — I10 Essential (primary) hypertension: Secondary | ICD-10-CM | POA: Diagnosis not present

## 2021-01-24 DIAGNOSIS — I70203 Unspecified atherosclerosis of native arteries of extremities, bilateral legs: Secondary | ICD-10-CM | POA: Diagnosis not present

## 2021-01-24 DIAGNOSIS — R2681 Unsteadiness on feet: Secondary | ICD-10-CM | POA: Diagnosis not present

## 2021-01-24 DIAGNOSIS — E785 Hyperlipidemia, unspecified: Secondary | ICD-10-CM | POA: Diagnosis not present

## 2021-01-24 DIAGNOSIS — I10 Essential (primary) hypertension: Secondary | ICD-10-CM | POA: Diagnosis not present

## 2021-01-24 DIAGNOSIS — Z95820 Peripheral vascular angioplasty status with implants and grafts: Secondary | ICD-10-CM | POA: Diagnosis not present

## 2021-01-25 ENCOUNTER — Other Ambulatory Visit: Payer: Self-pay | Admitting: Cardiology

## 2021-01-25 ENCOUNTER — Other Ambulatory Visit: Payer: Self-pay | Admitting: Nurse Practitioner

## 2021-01-25 DIAGNOSIS — I10 Essential (primary) hypertension: Secondary | ICD-10-CM | POA: Diagnosis not present

## 2021-01-25 DIAGNOSIS — E781 Pure hyperglyceridemia: Secondary | ICD-10-CM

## 2021-01-25 DIAGNOSIS — E0590 Thyrotoxicosis, unspecified without thyrotoxic crisis or storm: Secondary | ICD-10-CM

## 2021-01-26 DIAGNOSIS — E785 Hyperlipidemia, unspecified: Secondary | ICD-10-CM | POA: Diagnosis not present

## 2021-01-26 DIAGNOSIS — I10 Essential (primary) hypertension: Secondary | ICD-10-CM | POA: Diagnosis not present

## 2021-01-26 DIAGNOSIS — I70203 Unspecified atherosclerosis of native arteries of extremities, bilateral legs: Secondary | ICD-10-CM | POA: Diagnosis not present

## 2021-01-26 DIAGNOSIS — Z95820 Peripheral vascular angioplasty status with implants and grafts: Secondary | ICD-10-CM | POA: Diagnosis not present

## 2021-01-26 DIAGNOSIS — R2681 Unsteadiness on feet: Secondary | ICD-10-CM | POA: Diagnosis not present

## 2021-01-29 DIAGNOSIS — I10 Essential (primary) hypertension: Secondary | ICD-10-CM | POA: Diagnosis not present

## 2021-01-29 DIAGNOSIS — E785 Hyperlipidemia, unspecified: Secondary | ICD-10-CM | POA: Diagnosis not present

## 2021-01-29 DIAGNOSIS — R2681 Unsteadiness on feet: Secondary | ICD-10-CM | POA: Diagnosis not present

## 2021-01-29 DIAGNOSIS — Z95820 Peripheral vascular angioplasty status with implants and grafts: Secondary | ICD-10-CM | POA: Diagnosis not present

## 2021-01-29 DIAGNOSIS — I70203 Unspecified atherosclerosis of native arteries of extremities, bilateral legs: Secondary | ICD-10-CM | POA: Diagnosis not present

## 2021-02-06 DIAGNOSIS — R2681 Unsteadiness on feet: Secondary | ICD-10-CM | POA: Diagnosis not present

## 2021-02-06 DIAGNOSIS — E785 Hyperlipidemia, unspecified: Secondary | ICD-10-CM | POA: Diagnosis not present

## 2021-02-06 DIAGNOSIS — I10 Essential (primary) hypertension: Secondary | ICD-10-CM | POA: Diagnosis not present

## 2021-02-06 DIAGNOSIS — I70203 Unspecified atherosclerosis of native arteries of extremities, bilateral legs: Secondary | ICD-10-CM | POA: Diagnosis not present

## 2021-02-06 DIAGNOSIS — Z95820 Peripheral vascular angioplasty status with implants and grafts: Secondary | ICD-10-CM | POA: Diagnosis not present

## 2021-02-08 ENCOUNTER — Other Ambulatory Visit: Payer: Self-pay

## 2021-02-08 ENCOUNTER — Encounter: Payer: Self-pay | Admitting: Nurse Practitioner

## 2021-02-08 ENCOUNTER — Other Ambulatory Visit: Payer: Self-pay | Admitting: Cardiology

## 2021-02-08 ENCOUNTER — Other Ambulatory Visit: Payer: Self-pay | Admitting: Nurse Practitioner

## 2021-02-08 ENCOUNTER — Ambulatory Visit (INDEPENDENT_AMBULATORY_CARE_PROVIDER_SITE_OTHER): Payer: Medicare HMO | Admitting: Nurse Practitioner

## 2021-02-08 ENCOUNTER — Ambulatory Visit (INDEPENDENT_AMBULATORY_CARE_PROVIDER_SITE_OTHER): Payer: Medicare HMO

## 2021-02-08 VITALS — BP 148/66 | HR 72 | Temp 98.6°F | Ht 67.0 in | Wt 240.4 lb

## 2021-02-08 VITALS — BP 148/66 | HR 72 | Temp 98.6°F | Ht 67.0 in | Wt 240.3 lb

## 2021-02-08 DIAGNOSIS — E0590 Thyrotoxicosis, unspecified without thyrotoxic crisis or storm: Secondary | ICD-10-CM

## 2021-02-08 DIAGNOSIS — M25649 Stiffness of unspecified hand, not elsewhere classified: Secondary | ICD-10-CM

## 2021-02-08 DIAGNOSIS — G3184 Mild cognitive impairment, so stated: Secondary | ICD-10-CM

## 2021-02-08 DIAGNOSIS — Z0000 Encounter for general adult medical examination without abnormal findings: Secondary | ICD-10-CM

## 2021-02-08 DIAGNOSIS — E782 Mixed hyperlipidemia: Secondary | ICD-10-CM | POA: Diagnosis not present

## 2021-02-08 DIAGNOSIS — M25541 Pain in joints of right hand: Secondary | ICD-10-CM

## 2021-02-08 DIAGNOSIS — Z23 Encounter for immunization: Secondary | ICD-10-CM

## 2021-02-08 DIAGNOSIS — I10 Essential (primary) hypertension: Secondary | ICD-10-CM | POA: Diagnosis not present

## 2021-02-08 DIAGNOSIS — E781 Pure hyperglyceridemia: Secondary | ICD-10-CM

## 2021-02-08 DIAGNOSIS — M25542 Pain in joints of left hand: Secondary | ICD-10-CM | POA: Diagnosis not present

## 2021-02-08 LAB — POCT URINALYSIS DIPSTICK
Bilirubin, UA: NEGATIVE
Blood, UA: NEGATIVE
Glucose, UA: NEGATIVE
Ketones, UA: NEGATIVE
Leukocytes, UA: NEGATIVE
Nitrite, UA: NEGATIVE
Protein, UA: NEGATIVE
Spec Grav, UA: 1.01 (ref 1.010–1.025)
Urobilinogen, UA: 0.2 E.U./dL
pH, UA: 7 (ref 5.0–8.0)

## 2021-02-08 LAB — POCT UA - MICROALBUMIN
Albumin/Creatinine Ratio, Urine, POC: <30
Creatinine, POC: 200 mg/dL
Microalbumin Ur, POC: 10 mg/L

## 2021-02-08 MED ORDER — PREVNAR 20 0.5 ML IM SUSY: 0.5000 mL | PREFILLED_SYRINGE | INTRAMUSCULAR | 0 refills | Status: AC

## 2021-02-08 MED ORDER — BOOSTRIX 5-2.5-18.5 LF-MCG/0.5 IM SUSP: 0.5000 mL | Freq: Once | INTRAMUSCULAR | 0 refills | Status: AC

## 2021-02-08 NOTE — Patient Instructions (Signed)
Vitamin D Deficiency Vitamin D deficiency is when your body does not have enough vitamin D. Vitamin D is important to your body because:  It helps your body use other minerals.  It helps to keep your bones strong and healthy.  It may help to prevent some diseases.  It helps your heart and other muscles work well. Not getting enough vitamin D can make your bones soft. It can also cause other health problems. What are the causes? This condition may be caused by:  Not eating enough foods that contain vitamin D.  Not getting enough sun.  Having diseases that make it hard for your body to absorb vitamin D.  Having a surgery in which a part of the stomach or a part of the small intestine is removed.  Having kidney disease or liver disease. What increases the risk? You are more likely to get this condition if:  You are older.  You do not spend much time outdoors.  You live in a nursing home.  You have had broken bones.  You have weak or thin bones (osteoporosis).  You have a disease or condition that changes how your body absorbs vitamin D.  You have dark skin.  You take certain medicines.  You are overweight or obese. What are the signs or symptoms?  In mild cases, there may not be any symptoms. If the condition is very bad, symptoms may include: ? Bone pain. ? Muscle pain. ? Falling often. ? Broken bones caused by a minor injury. How is this treated? Treatment may include taking supplements as told by your doctor. Your doctor will tell you what dose is best for you. Supplements may include:  Vitamin D.  Calcium. Follow these instructions at home: Eating and drinking  Eat foods that contain vitamin D, such as: ? Dairy products, cereals, or juices with added vitamin D. Check the label. ? Fish, such as salmon or trout. ? Eggs. ? Oysters. ? Mushrooms. The items listed above may not be a complete list of what you can eat and drink. Contact a dietitian for more  options.   General instructions  Take medicines and supplements only as told by your doctor.  Get regular, safe exposure to natural sunlight.  Do not use a tanning bed.  Maintain a healthy weight. Lose weight if needed.  Keep all follow-up visits as told by your doctor. This is important. How is this prevented?  You can get vitamin D by: ? Eating foods that naturally contain vitamin D. ? Eating or drinking products that have vitamin D added to them, such as cereals, juices, and milk. ? Taking vitamin D or a multivitamin that contains vitamin D. ? Being in the sun. Your body makes vitamin D when your skin is exposed to sunlight. Your body changes the sunlight into a form of the vitamin that it can use. Contact a doctor if:  Your symptoms do not go away.  You feel sick to your stomach (nauseous).  You throw up (vomit).  You poop less often than normal, or you have trouble pooping (constipation). Summary  Vitamin D deficiency is when your body does not have enough vitamin D.  Vitamin D helps to keep your bones strong and healthy.  This condition is often treated by taking a supplement.  Your doctor will tell you what dose is best for you. This information is not intended to replace advice given to you by your health care provider. Make sure you discuss any questions   you have with your health care provider. Document Revised: 07/20/2018 Document Reviewed: 07/20/2018 Elsevier Patient Education  2021 [employer]  

## 2021-02-08 NOTE — Addendum Note (Signed)
Addended by: Barb Merino on: 02/08/2021 03:02 PM   Modules accepted: Orders

## 2021-02-08 NOTE — Progress Notes (Signed)
This visit occurred during the SARS-CoV-2 public health emergency.  Safety protocols were in place, including screening questions prior to the visit, additional usage of staff PPE, and extensive cleaning of exam room while observing appropriate contact time as indicated for disinfecting solutions.  Subjective:   Jesus Lam is a 87 y.o. male who presents for Medicare Annual/Subsequent preventive examination.  Review of Systems     Cardiac Risk Factors include: advanced age (>64men, >80 women);dyslipidemia;hypertension;male gender     Objective:    Today's Vitals   02/08/21 1404 02/08/21 1415  BP: (!) 148/66   Pulse: 72   Temp: 98.6 F (37 C)   TempSrc: Oral   Weight: 240 lb 6.4 oz (109 kg)   Height:  (1.702 m)   PainSc:  8    Body mass index is 37.65 kg/m.  Advanced Directives 02/08/2021 02/03/2020 02/08/2019 01/28/2019 06/13/2018 06/12/2018 01/27/2018  Does Patient Have a Medical Advance Directive? No No No No No No Yes  Type of Advance Directive - - - - - - Living will  Does patient want to make changes to medical advance directive? - - - - - - No - Patient declined  Would patient like information on creating a medical advance directive? - - - No - Patient declined No - Patient declined No - Patient declined -    Current Medications (verified) Outpatient Encounter Medications as of 02/08/2021  Medication Sig  . amLODipine (NORVASC) 5 MG tablet TAKE ONE TABLET BY MOUTH DAILY  . atorvastatin (LIPITOR) 80 MG tablet TAKE 1 TABLET ONE TIME DAILY  . bisoprolol (ZEBETA) 5 MG tablet TAKE HALF TABLET BY MOUTH DAILY  . Cholecalciferol (VITAMIN D3) 5000 units CAPS Take 5,000 Units by mouth daily.  . clopidogrel (PLAVIX) 75 MG tablet TAKE ONE TABLET BY MOUTH DAILY  . diclofenac Sodium (VOLTAREN) 1 % GEL APPLY TWO GRAMS TOPICALLY FOUR TIMES A DAY  . donepezil (ARICEPT) 5 MG tablet TAKE 1 TABLET (5 MG TOTAL) BY MOUTH AT BEDTIME.  [name] (MEDICAL COMPRESSION  STOCKINGS) MISC   . furosemide (LASIX) 20 MG tablet TAKE ONE TABLET BY MOUTH DAILY  . gabapentin (NEURONTIN) 300 MG capsule TAKE ONE CAPSULE BY MOUTH AT BEDTIME  . hydrALAZINE (APRESOLINE) 25 MG tablet Take 25 mg by mouth in the morning and at bedtime.  Marland Kitchen icosapent Ethyl (VASCEPA) 1 g capsule TAKE ONE CAPSULE BY MOUTH TWICE A DAY  . isosorbide dinitrate (ISORDIL) 30 MG tablet Take 30 mg by mouth 2 (two) times daily.  Marland Kitchen ketorolac (ACULAR) 0.5 % ophthalmic solution ketorolac 0.5 % eye drops  . Menthol-Methyl Salicylate (MUSCLE RUB) 10-15 % CREA muscle rub  . methimazole (TAPAZOLE) 10 MG tablet TAKE ONE TABLET BY MOUTH DAILY  . Multiple Vitamins-Minerals (COMPLETE SENIOR PO) 1 tablet daily.   . pneumococcal 20-Val Conj Vacc (PREVNAR 20) 0.5 ML SUSY Inject 0.5 mLs into the muscle tomorrow at 10 am for 1 dose.  . tamsulosin (FLOMAX) 0.4 MG CAPS capsule TAKE ONE CAPSULE BY MOUTH DAILY HALF HOUR FOLLOWING THE SAME MEAL DAILY  . Tdap (BOOSTRIX) 5-2.5-18.5 LF-MCG/0.5 injection Inject 0.5 mLs into the muscle once for 1 dose.  . vitamin B-12 (CYANOCOBALAMIN) 500 MCG tablet Take 500 mcg by mouth daily.  . vitamin C (ASCORBIC ACID) 500 MG tablet Take 500 mg daily by mouth.  . chlorthalidone (HYGROTON) 25 MG tablet Take 1 tablet (25 mg total) by mouth daily. (Patient not taking: No sig reported)  . Omega-3 Fatty Acids (  FISH OIL PO) Take 1,000 mg by mouth. (Patient not taking: Reported on 02/08/2021)   No facility-administered encounter medications on file as of 02/08/2021.    Allergies (verified) Shellfish allergy   History: Past Medical History:  Diagnosis Date  . Anemia   . BPH (benign prostatic hyperplasia)   . Cataracts, bilateral   . CVA (cerebral vascular accident) (HCC)   . DVT (deep venous thrombosis) (HCC)    dvt in left leg  . Dyslipidemia   . Gout   . Gout   . HTN (hypertension)   . Left hip pain 03/15/2020  . Left leg pain   . Osteoarthritis   . PAD (peripheral artery disease)  (HCC)   . Stasis dermatitis   . Stroke (HCC)   . Vitamin D deficiency    Past Surgical History:  Procedure Laterality Date  . ABDOMINAL AORTOGRAM N/A 01/13/2018   Procedure: ABDOMINAL AORTOGRAM;  Surgeon: Elder Negus, MD;  Location: [facility];  Service: Cardiovascular;  Laterality: N/A;  . CATARACT EXTRACTION, BILATERAL    . LEFT HEART CATH AND CORONARY ANGIOGRAPHY N/A 01/13/2018   Procedure: LEFT HEART CATH AND CORONARY ANGIOGRAPHY;  Surgeon: Elder Negus, MD;  Location: [facility];  Service: Cardiovascular;  Laterality: N/A;  . LOWER EXTREMITY ANGIOGRAPHY N/A 01/13/2018   Procedure: LOWER EXTREMITY ANGIOGRAPHY;  Surgeon: Elder Negus, MD;  Location: [facility];  Service: Cardiovascular;  Laterality: N/A;  . LOWER EXTREMITY ANGIOGRAPHY Right 01/27/2018   Procedure: LOWER EXTREMITY ANGIOGRAPHY;  Surgeon: Yates Decamp, MD;  Location: [facility];  Service: Cardiovascular;  Laterality: Right;  . PERIPHERAL VASCULAR ATHERECTOMY Left 01/13/2018   Procedure: PERIPHERAL VASCULAR ATHERECTOMY;  Surgeon: Elder Negus, MD;  Location: [facility];  Service: Cardiovascular;  Laterality: Left;  SFA WITH PTA DRUG COATED BALLOON  . PERIPHERAL VASCULAR INTERVENTION  01/27/2018   Procedure: PERIPHERAL VASCULAR INTERVENTION;  Surgeon: Yates Decamp, MD;  Location: [facility];  Service: Cardiovascular;;   Family History  Problem Relation Age of Onset  . Cancer Mother   . Cancer Father   . Alcohol abuse Father   . Breast cancer Daughter   . Stroke Daughter   . CVA Daughter    Social History   Socioeconomic History  . Marital status: Single    Spouse name: Not on file  . Number of children: 3  . Years of education: Not on file  . Highest education level: Not on file  Occupational History  . Occupation: retired  Tobacco Use  . Smoking status: Never Smoker  . Smokeless tobacco: Never Used  Vaping Use  . Vaping Use: Never used   Substance and Sexual Activity  . Alcohol use: Yes    Alcohol/week: 6.0 - 7.0 standard drinks    Types: 6 - 7 Shots of liquor per week    Comment: 6-7 drinks per week  . Drug use: Yes    Frequency: 7.0 times per week    Types: Marijuana    Comment: daily ( 02/09/19)  . Sexual activity: Yes  Other Topics Concern  . Not on file  Social History Narrative   ** Merged History Encounter **       Lives with daughter, [name]. 2 daughters live here in [place]. Son lives in [place]. 3/16- offered information on local food pantries as well as congregate meal sites. Information declined.   Social Determinants of Health   Financial Resource Strain: Low Risk   .  Difficulty of Paying Living Expenses: Not hard at all  Food Insecurity: No Food Insecurity  . Worried About Programme researcher, broadcasting/film/video in the Last Year: Never true  . Ran Out of Food in the Last Year: Never true  Transportation Needs: No Transportation Needs  . Lack of Transportation (Medical): No  . Lack of Transportation (Non-Medical): No  Physical Activity: Insufficiently Active  . Days of Exercise per Week: 3 days  . Minutes of Exercise per Session: 40 min  Stress: Stress Concern Present  . Feeling of Stress : To some extent  Social Connections: Not on file    Tobacco Counseling Counseling given: Not Answered   Clinical Intake:  Pre-visit preparation completed: Yes  Pain : 0-10 Pain Score: 8  Pain Type: Chronic pain Pain Location: Knee Pain Orientation: Left Pain Descriptors / Indicators: Dull Pain Onset: More than a month ago Pain Frequency: Intermittent Pain Relieving Factors: rest  Pain Relieving Factors: rest  Nutritional Status: BMI > 30  Obese Nutritional Risks: None Diabetes: No  How often do you need to have someone help you when you read instructions, pamphlets, or other written materials from your doctor or pharmacy?: 1 - Never What is the last grade level you completed in school?: some  college  Diabetic? no  Interpreter Needed?: No  Information entered by :: NAllen LPN   Activities of Daily Living In your present state of health, do you have any difficulty performing the following activities: 02/08/2021  Hearing? Y  Vision? Y  Comment blurry at times  Difficulty concentrating or making decisions? Y  Walking or climbing stairs? Y  Dressing or bathing? N  Doing errands, shopping? N  Preparing Food and eating ? N  Using the Toilet? N  In the past six months, have you accidently leaked urine? Y  Do you have problems with loss of bowel control? N  Managing your Medications? N  Managing your Finances? N  Housekeeping or managing your Housekeeping? Y  Some recent data might be hidden    Patient Care Team: [name], FNP as PCP - General (General Practice) Little, Karma Lew, RN as Case Manager  Indicate any recent Medical Services you may have received from other than [facility] providers in the past year (date may be approximate).     Assessment:   This is a routine wellness examination for Kyley.  Hearing/Vision screen  Hearing Screening   125Hz  250Hz  500Hz  1000Hz  2000Hz  3000Hz  4000Hz  6000Hz  8000Hz   Right ear:           Left ear:           Vision Screening Comments: Regular eye exams, [facility]  Dietary issues and exercise activities discussed: Current Exercise Habits: Home exercise routine, Type of exercise: Other - see comments (swimming), Time (Minutes): 45, Frequency (Times/Week): 3, Weekly Exercise (Minutes/Week): 135  Goals    .  "I would like to learn what foods make my Triglycerides too high" (pt-stated)      CARE PLAN ENTRY (see longitudinal plan of care for additional care plan information)  Current Barriers:  Knowledge Deficits related to disease process and Self Health management of high Triglycerides . Chronic Disease Management support and education needs related to HTN, Hyperthyroidism, Altered memory,  Bilateral hand pain, Medication  management, Pure hypercholesterolemia, Chronic pain of left knee   Nurse Case Manager Clinical Goal(s):  . 08/08/20 New Over the next 180 days, patient will work with CCM RN CM and PCP to address needs related  to disease education and support to help lower Triglycerides  CCM RN CM Interventions:  11/14/20 call completed with patient's daughter [name] . Inter-disciplinary care team collaboration (see longitudinal plan of care) . Evaluation of current treatment plan related to elevated Triglycerides and patient's adherence to plan as established by provider . Educated daughter re: causes for elevated Triglycerides; Educated on dietary and exercise recommendations; Determined patient swims at the [facility] at least 3 days per week . Confirmed with daughter, patient continues to take his cholesterol medication exactly as prescribed . Discussed plans with patient for ongoing care management follow up and provided patient with direct contact information for care management team  Patient Self Care Activities:  . Self administer medications as prescribed . Attend all scheduled provider appointments . Call pharmacy for medication refills . Performs ADL's independently . Performs IADL's independently . Call provider office for new concerns or questions  Please see past updates related to this goal by clicking on the "Past Updates" button in the selected goal      .  Evaluate and treat memory loss      Current Barriers:  Marland Kitchen Knowledge Deficits related to evaluation and treatment for worsening memory loss   . Chronic Disease Management support and education needs related to HTN, Hyperthyroidism, Altered memory,  Bilateral hand pain, Medication management, Pure hypercholesterolemia, Chronic pain of left knee  . Cognitive Deficits  Nurse Case Manager Clinical Goal(s):  Marland Kitchen Over the next 90 days, patient will work with the CCM team and PCP to address needs related to evaluation and treatment of worsening  memory loss   Interventions:  . 1:1 collaboration with [name], FNP regarding development and update of comprehensive plan of care as evidenced by provider attestation and co-signature . Inter-disciplinary care team collaboration (see longitudinal plan of care) . Evaluation of current treatment plan related to worsening memory loss  and patient's adherence to plan as established by provider. . Provided education to patient re: treatment recommendations per PCP for CT scan; Educated on disease process, diagnosis and treatment for Dementia   . Reviewed medications with patient and discussed patient is adhering to taking his prescribed medications exactly as prescribed including Aricept; determined daughter finds this medication to be uneffective and mood changing for patient  . Discussed plans with patient for ongoing care management follow up and provided patient with direct contact information for care management team . Reviewed scheduled/upcoming provider appointments including: CT scan scheduled for 11/30/20 :40 pm   Patient Goals/Self-Care Activities Over the next 90 days, patient will:  - Patient will self administer medications as prescribed Patient will attend all scheduled provider appointments Patient will call pharmacy for medication refills Patient will continue to perform ADL's independently Patient will continue to perform IADL's independently Patient will call provider office for new concerns or questions  Follow Up Plan: Telephone follow up appointment with care management team member scheduled for: 12/14/20        .  Exercise 150 min/wk Moderate Activity      [facility]    .  Patient Stated (pt-stated)      Wants to get better with memory    .  Patient Stated      02/08/2021, wants to get better      Depression Screen PHQ 2/9 Scores 02/08/2021 02/03/2020 02/03/2020 11/15/2019 08/11/2019 05/06/2019 01/28/2019  PHQ - 2 Score 3 0 0 0 0  0 0  PHQ- 9 Score 9 0 - - - -  6    Fall Risk Fall Risk  02/08/2021 02/03/2020 02/03/2020 11/15/2019 08/11/2019  Falls in the past year? 1 0 0 0 0  Comment tripped over himself - - - -  Number falls in past yr: 0 - - - -  Injury with Fall? 0 - - - -  Risk for fall due to : Impaired balance/gait;Medication side effect Medication side effect - - -  Follow up Falls evaluation completed;Education provided;Falls prevention discussed Falls evaluation completed;Education provided;Falls prevention discussed - - -    FALL RISK PREVENTION PERTAINING TO THE HOME:  Any stairs in or around the home? Yes  If so, are there any without handrails? No  Home free of loose throw rugs in walkways, pet beds, electrical cords, etc? Yes  Adequate lighting in your home to reduce risk of falls? Yes   ASSISTIVE DEVICES UTILIZED TO PREVENT FALLS:  Life alert? No  Use of a cane, walker or w/c? Yes  Grab bars in the bathroom? Yes  Shower chair or bench in shower? Yes  Elevated toilet seat or a handicapped toilet? No   TIMED UP AND GO:  Was the test performed? No .   Gait slow and steady with assistive device  Cognitive Function:     6CIT Screen 02/08/2021 02/03/2020 01/28/2019  What Year? 0 points 4 points 0 points  What month? 0 points 0 points 0 points  What time? 0 points 0 points 0 points  Count back from 20 0 points 0 points 4 points  Months in reverse 4 points 4 points 0 points  Repeat phrase 6 points 6 points 0 points  Total Score 10 14 4     Immunizations Immunization History  Administered Date(s) Administered  . Fluad Quad(high Dose 65+) 08/03/2020  . Influenza, High Dose Seasonal PF 10/29/2018, 08/11/2019  . Moderna SARS-COV2 Booster Vaccination 11/15/2020  . PFIZER(Purple Top)SARS-COV-2 Vaccination 01/17/2020, 01/29/2020    TDAP status: Due, Education has been provided regarding the importance of this vaccine. Advised may receive this vaccine at local pharmacy or Health Dept. Aware to  provide a copy of the vaccination record if obtained from local pharmacy or Health Dept. Verbalized acceptance and understanding.  Flu Vaccine status: Up to date  Pneumococcal vaccine status: Due, Education has been provided regarding the importance of this vaccine. Advised may receive this vaccine at local pharmacy or Health Dept. Aware to provide a copy of the vaccination record if obtained from local pharmacy or Health Dept. Verbalized acceptance and understanding.  Covid-19 vaccine status: Completed vaccines  Qualifies for Shingles Vaccine? Yes   Zostavax completed No   Shingrix Completed?: No.    Education has been provided regarding the importance of this vaccine. Patient has been advised to call insurance company to determine out of pocket expense if they have not yet received this vaccine. Advised may also receive vaccine at local pharmacy or Health Dept. Verbalized acceptance and understanding.  Screening Tests Health Maintenance  Topic Date Due  . TETANUS/TDAP  Never done  . PNA vac Low Risk Adult (1 of 2 - PCV13) Never done  . INFLUENZA VACCINE  Completed  . COVID-19 Vaccine  Completed  . HPV VACCINES  Aged Out    Health Maintenance  Health Maintenance Due  Topic Date Due  . TETANUS/TDAP  Never done  . PNA vac Low Risk Adult (1 of 2 - PCV13) Never done    Colorectal cancer screening: No longer required.   Lung Cancer Screening: (Low Dose CT Chest  recommended if Age 41-80 years, 30 pack-year currently smoking OR have quit w/in 15years.) does not qualify.   Lung Cancer Screening Referral: no  Additional Screening:  Hepatitis C Screening: does not qualify;   Vision Screening: Recommended annual ophthalmology exams for early detection of glaucoma and other disorders of the eye. Is the patient up to date with their annual eye exam?  Yes  Who is the provider or what is the name of the office in which the patient attends annual eye exams? [facility] If pt is not  established with a provider, would they like to be referred to a provider to establish care? No .   Dental Screening: Recommended annual dental exams for proper oral hygiene  Community Resource Referral / Chronic Care Management: CRR required this visit?  No   CCM required this visit?  No      Plan:     I have personally reviewed and noted the following in the patient's chart:   . Medical and social history . Use of alcohol, tobacco or illicit drugs  . Current medications and supplements . Functional ability and status . Nutritional status . Physical activity . Advanced directives . List of other physicians . Hospitalizations, surgeries, and ER visits in previous 12 months . Vitals . Screenings to include cognitive, depression, and falls . Referrals and appointments  In addition, I have reviewed and discussed with patient certain preventive protocols, quality metrics, and best practice recommendations. A written personalized care plan for preventive services as well as general preventive health recommendations were provided to patient.     Barb Merino, LPN   [id number]/[id number]   Nurse Notes:

## 2021-02-08 NOTE — Patient Instructions (Signed)
[name] , Thank you for taking time to come for your Medicare Wellness Visit. I appreciate your ongoing commitment to your health goals. Please review the following plan we discussed and let me know if I can assist you in the future.   Screening recommendations/referrals: Colonoscopy: not required Recommended yearly ophthalmology/optometry visit for glaucoma screening and checkup Recommended yearly dental visit for hygiene and checkup  Vaccinations: Influenza vaccine: completed 08/03/2020, due 06/25/2021 Pneumococcal vaccine: sent to pharmacy Tdap vaccine: sent to pharmacy Shingles vaccine:  discussed   Covid-19:  11/15/2020, 01/29/2020, 01/17/2020  Advanced directives: Advance directive discussed with you today. Even though you declined this today please call our office should you change your mind and we can give you the proper paperwork for you to fill out.  Conditions/risks identified: none  Next appointment: Follow up in one year for your annual wellness visit.   Preventive Care 49 Years and Older, Male Preventive care refers to lifestyle choices and visits with your health care provider that can promote health and wellness. What does preventive care include?  A yearly physical exam. This is also called an annual well check.  Dental exams once or twice a year.  Routine eye exams. Ask your health care provider how often you should have your eyes checked.  Personal lifestyle choices, including:  Daily care of your teeth and gums.  Regular physical activity.  Eating a healthy diet.  Avoiding tobacco and drug use.  Limiting alcohol use.  Practicing safe sex.  Taking low doses of aspirin every day.  Taking vitamin and mineral supplements as recommended by your health care provider. What happens during an annual well check? The services and screenings done by your health care provider during your annual well check will depend on your age, overall health, lifestyle risk  factors, and family history of disease. Counseling  Your health care provider may ask you questions about your:  Alcohol use.  Tobacco use.  Drug use.  Emotional well-being.  Home and relationship well-being.  Sexual activity.  Eating habits.  History of falls.  Memory and ability to understand (cognition).  Work and work Astronomer. Screening  You may have the following tests or measurements:  Height, weight, and BMI.  Blood pressure.  Lipid and cholesterol levels. These may be checked every 5 years, or more frequently if you are over 46 years old.  Skin check.  Lung cancer screening. You may have this screening every year starting at age 67 if you have a 30-pack-year history of smoking and currently smoke or have quit within the past 15 years.  Fecal occult blood test (FOBT) of the stool. You may have this test every year starting at age 62.  Flexible sigmoidoscopy or colonoscopy. You may have a sigmoidoscopy every 5 years or a colonoscopy every 10 years starting at age 72.  Prostate cancer screening. Recommendations will vary depending on your family history and other risks.  Hepatitis C blood test.  Hepatitis B blood test.  Sexually transmitted disease (STD) testing.  Diabetes screening. This is done by checking your blood sugar (glucose) after you have not eaten for a while (fasting). You may have this done every 1-3 years.  Abdominal aortic aneurysm (AAA) screening. You may need this if you are a current or former smoker.  Osteoporosis. You may be screened starting at age 31 if you are at high risk. Talk with your health care provider about your test results, treatment options, and if necessary, the need for more  tests. Vaccines  Your health care provider may recommend certain vaccines, such as:  Influenza vaccine. This is recommended every year.  Tetanus, diphtheria, and acellular pertussis (Tdap, Td) vaccine. You may need a Td booster every 10  years.  Zoster vaccine. You may need this after age 66.  Pneumococcal 13-valent conjugate (PCV13) vaccine. One dose is recommended after age 62.  Pneumococcal polysaccharide (PPSV23) vaccine. One dose is recommended after age 22. Talk to your health care provider about which screenings and vaccines you need and how often you need them. This information is not intended to replace advice given to you by your health care provider. Make sure you discuss any questions you have with your health care provider. Document Released: 12/08/2015 Document Revised: 07/31/2016 Document Reviewed: 09/12/2015 Elsevier Interactive Patient Education  2017 [place] Prevention in the Home Falls can cause injuries. They can happen to people of all ages. There are many things you can do to make your home safe and to help prevent falls. What can I do on the outside of my home?  Regularly fix the edges of walkways and driveways and fix any cracks.  Remove anything that might make you trip as you walk through a door, such as a raised step or threshold.  Trim any bushes or trees on the path to your home.  Use bright outdoor lighting.  Clear any walking paths of anything that might make someone trip, such as rocks or tools.  Regularly check to see if handrails are loose or broken. Make sure that both sides of any steps have handrails.  Any raised decks and porches should have guardrails on the edges.  Have any leaves, snow, or ice cleared regularly.  Use sand or salt on walking paths during winter.  Clean up any spills in your garage right away. This includes oil or grease spills. What can I do in the bathroom?  Use night lights.  Install grab bars by the toilet and in the tub and shower. Do not use towel bars as grab bars.  Use non-skid mats or decals in the tub or shower.  If you need to sit down in the shower, use a plastic, non-slip stool.  Keep the floor dry. Clean up any water that  spills on the floor as soon as it happens.  Remove soap buildup in the tub or shower regularly.  Attach bath mats securely with double-sided non-slip rug tape.  Do not have throw rugs and other things on the floor that can make you trip. What can I do in the bedroom?  Use night lights.  Make sure that you have a light by your bed that is easy to reach.  Do not use any sheets or blankets that are too big for your bed. They should not hang down onto the floor.  Have a firm chair that has side arms. You can use this for support while you get dressed.  Do not have throw rugs and other things on the floor that can make you trip. What can I do in the kitchen?  Clean up any spills right away.  Avoid walking on wet floors.  Keep items that you use a lot in easy-to-reach places.  If you need to reach something above you, use a strong step stool that has a grab bar.  Keep electrical cords out of the way.  Do not use floor polish or wax that makes floors slippery. If you must use wax, use non-skid floor  wax.  Do not have throw rugs and other things on the floor that can make you trip. What can I do with my stairs?  Do not leave any items on the stairs.  Make sure that there are handrails on both sides of the stairs and use them. Fix handrails that are broken or loose. Make sure that handrails are as long as the stairways.  Check any carpeting to make sure that it is firmly attached to the stairs. Fix any carpet that is loose or worn.  Avoid having throw rugs at the top or bottom of the stairs. If you do have throw rugs, attach them to the floor with carpet tape.  Make sure that you have a light switch at the top of the stairs and the bottom of the stairs. If you do not have them, ask someone to add them for you. What else can I do to help prevent falls?  Wear shoes that:  Do not have high heels.  Have rubber bottoms.  Are comfortable and fit you well.  Are closed at the  toe. Do not wear sandals.  If you use a stepladder:  Make sure that it is fully opened. Do not climb a closed stepladder.  Make sure that both sides of the stepladder are locked into place.  Ask someone to hold it for you, if possible.  Clearly mark and make sure that you can see:  Any grab bars or handrails.  First and last steps.  Where the edge of each step is.  Use tools that help you move around (mobility aids) if they are needed. These include:  Canes.  Walkers.  Scooters.  Crutches.  Turn on the lights when you go into a dark area. Replace any light bulbs as soon as they burn out.  Set up your furniture so you have a clear path. Avoid moving your furniture around.  If any of your floors are uneven, fix them.  If there are any pets around you, be aware of where they are.  Review your medicines with your doctor. Some medicines can make you feel dizzy. This can increase your chance of falling. Ask your doctor what other things that you can do to help prevent falls. This information is not intended to replace advice given to you by your health care provider. Make sure you discuss any questions you have with your health care provider. Document Released: 09/07/2009 Document Revised: 04/18/2016 Document Reviewed: 12/16/2014 Elsevier Interactive Patient Education  2017 [employer].

## 2021-02-08 NOTE — Progress Notes (Signed)
I,Yamilka Roman [employer] as a Education administrator for [employer], FNP.,have documented all relevant documentation on the behalf of Minette Brine, FNP,as directed by  Minette Brine, FNP while in the presence of Minette Brine, [place].  This visit occurred during the SARS-CoV-2 public health emergency.  Safety protocols were in place, including screening questions prior to the visit, additional usage of staff PPE, and extensive cleaning of exam room while observing appropriate contact time as indicated for disinfecting solutions.  Subjective:     Patient ID: Jesus Lam , male    DOB: 02-18-1933 , 87 y.o.   MRN: [id number]   Chief Complaint  Patient presents with  . vitamin d recheck    HPI  Patient presents today for medication follow up (Aricept) and cholesterol check.  He is tolerating medications well.      Past Medical History:  Diagnosis Date  . Anemia   . BPH (benign prostatic hyperplasia)   . Cataracts, bilateral   . CVA (cerebral vascular accident) ([place])   . DVT (deep venous thrombosis) ([facility])    dvt in left leg  . Dyslipidemia   . Gout   . Gout   . HTN (hypertension)   . Left hip pain 03/15/2020  . Left leg pain   . Osteoarthritis   . PAD (peripheral artery disease) ([place])   . Stasis dermatitis   . Stroke ([place])   . Vitamin D deficiency      Family History  Problem Relation Age of Onset  . Cancer Mother   . Cancer Father   . Alcohol abuse Father   . Breast cancer Daughter   . Stroke Daughter   . CVA Daughter      Current Outpatient Medications:  .  amLODipine (NORVASC) 5 MG tablet, TAKE ONE TABLET BY MOUTH DAILY, Disp: 90 tablet, Rfl: 1 .  atorvastatin (LIPITOR) 80 MG tablet, TAKE 1 TABLET ONE TIME DAILY, Disp: 5 tablet, Rfl: 0 .  bisoprolol (ZEBETA) 5 MG tablet, TAKE HALF TABLET BY MOUTH DAILY, Disp: 60 tablet, Rfl: 2 .  chlorthalidone (HYGROTON) 25 MG tablet, Take 1 tablet (25 mg total) by mouth daily. (Patient not taking: No sig reported), Disp: 5 tablet, Rfl:  0 .  Cholecalciferol (VITAMIN D3) 5000 units CAPS, Take 5,000 Units by mouth daily., Disp: , Rfl:  .  clopidogrel (PLAVIX) 75 MG tablet, TAKE ONE TABLET BY MOUTH DAILY, Disp: 90 tablet, Rfl: 2 .  diclofenac Sodium (VOLTAREN) 1 % GEL, APPLY TWO GRAMS TOPICALLY FOUR TIMES A DAY, Disp: 100 g, Rfl: 0 .  donepezil (ARICEPT) 5 MG tablet, TAKE 1 TABLET BY MOUTH AT BEDTIME., Disp: 90 tablet, Rfl: 0 .  Elastic Bandages & Supports (MEDICAL COMPRESSION STOCKINGS) MISC, , Disp: , Rfl:  .  furosemide (LASIX) 20 MG tablet, TAKE ONE TABLET BY MOUTH DAILY, Disp: 90 tablet, Rfl: 0 .  gabapentin (NEURONTIN) 300 MG capsule, TAKE ONE CAPSULE BY MOUTH AT BEDTIME, Disp: 90 capsule, Rfl: 0 .  hydrALAZINE (APRESOLINE) 25 MG tablet, Take 25 mg by mouth in the morning and at bedtime., Disp: , Rfl:  .  icosapent Ethyl (VASCEPA) 1 g capsule, TAKE ONE CAPSULE BY MOUTH TWICE A DAY, Disp: 60 capsule, Rfl: 0 .  isosorbide dinitrate (ISORDIL) 30 MG tablet, Take 30 mg by mouth 2 (two) times daily., Disp: , Rfl:  .  ketorolac (ACULAR) 0.5 % ophthalmic solution, ketorolac 0.5 % eye drops, Disp: , Rfl:  .  Menthol-Methyl Salicylate (MUSCLE RUB) 10-15 % CREA, muscle rub, Disp: ,  Rfl:  .  methimazole (TAPAZOLE) 10 MG tablet, TAKE ONE TABLET BY MOUTH DAILY, Disp: 30 tablet, Rfl: 0 .  Multiple Vitamins-Minerals (COMPLETE SENIOR PO), 1 tablet daily. , Disp: , Rfl:  .  Omega-3 Fatty Acids (FISH OIL PO), Take 1,000 mg by mouth. (Patient not taking: Reported on 02/08/2021), Disp: , Rfl:  .  tamsulosin (FLOMAX) 0.4 MG CAPS capsule, TAKE ONE CAPSULE BY MOUTH DAILY HALF HOUR FOLLOWING THE SAME MEAL DAILY, Disp: 90 capsule, Rfl: 0 .  vitamin B-12 (CYANOCOBALAMIN) 500 MCG tablet, Take 500 mcg by mouth daily., Disp: , Rfl:  .  vitamin C (ASCORBIC ACID) 500 MG tablet, Take 500 mg daily by mouth., Disp: , Rfl:    Allergies  Allergen Reactions  . Shellfish Allergy Other (See Comments)    Gout      Review of Systems  Constitutional: Negative.   Negative for fatigue.  Respiratory: Negative.   Cardiovascular: Negative.  Negative for chest pain, palpitations and leg swelling.  Endocrine: Negative for polydipsia, polyphagia and polyuria.  Psychiatric/Behavioral: Negative.      Today's Vitals   02/08/21 1445  BP: (!) 148/66  Pulse: 72  Temp: 98.6 F (37 C)  Weight: 240 lb 4.8 oz (109 kg)  Height: 5' 7" (1.702 m)   Body mass index is 37.64 kg/m.   Objective:  Physical Exam Constitutional:      General: He is not in acute distress.    Appearance: Normal appearance. He is obese.  Cardiovascular:     Rate and Rhythm: Normal rate and regular rhythm.     Pulses: Normal pulses.     Heart sounds: Normal heart sounds.  Pulmonary:     Effort: Pulmonary effort is normal. No respiratory distress.     Breath sounds: Normal breath sounds. No wheezing.  Musculoskeletal:        General: Normal range of motion.     Cervical back: Normal range of motion and neck supple.  Skin:    General: Skin is warm and dry.     Capillary Refill: Capillary refill takes less than 2 seconds.  Neurological:     General: No focal deficit present.     Mental Status: He is alert and oriented to person, place, and time.     Cranial Nerves: No cranial nerve deficit.     Motor: No weakness.     Comments: He is able to make his needs known.   Psychiatric:        Mood and Affect: Mood normal.        Behavior: Behavior normal.        Thought Content: Thought content normal.        Judgment: Judgment normal.         Assessment And Plan:     1. Mixed hyperlipidemia  Chronic, controlled  Continue with current medications, tolerating medications well - Lipid panel  2. Essential hypertension  Chronic, fairly elevated  Continue with current medications - CMP14+EGFR  3. Subclinical hyperthyroidism  Chronic, overall doing well - TSH - T3, free - T4  4. Arthralgia of both hands  Persistent hand pain, will check autoimmune panel  Nodules  present to joints - Autoimmune Profile - Rheumatoid (RA) Factor - CRP (C-Reactive Protein)  5. Stiffness of hand joint, unspecified laterality - Autoimmune Profile - Rheumatoid (RA) Factor - CRP (C-Reactive Protein)     Patient was given opportunity to ask questions. Patient verbalized understanding of the plan and was able to repeat  key elements of the plan. All questions were answered to their satisfaction.  Minette Brine, FNP   I, Minette Brine, FNP, have reviewed all documentation for this visit. The documentation on 02/08/21 for the exam, diagnosis, procedures, and orders are all accurate and complete.   IF YOU HAVE BEEN REFERRED TO A SPECIALIST, IT MAY TAKE 1-2 WEEKS TO SCHEDULE/PROCESS THE REFERRAL. IF YOU HAVE NOT HEARD FROM US/SPECIALIST IN TWO WEEKS, PLEASE GIVE [place] A CALL AT [phone] X [phone].   THE PATIENT IS ENCOURAGED TO PRACTICE SOCIAL DISTANCING DUE TO THE COVID-19 PANDEMIC.

## 2021-02-09 LAB — CMP14+EGFR
ALT: 17 IU/L (ref 0–44)
AST: 21 IU/L (ref 0–40)
Albumin/Globulin Ratio: 1.2 (ref 1.2–2.2)
Albumin: 4 g/dL (ref 3.6–4.6)
Alkaline Phosphatase: 93 IU/L (ref 44–121)
BUN/Creatinine Ratio: 11 (ref 10–24)
BUN: 15 mg/dL (ref 8–27)
Bilirubin Total: 0.5 mg/dL (ref 0.0–1.2)
CO2: 23 mmol/L (ref 20–29)
Calcium: 9.5 mg/dL (ref 8.6–10.2)
Chloride: 108 mmol/L — ABNORMAL HIGH (ref 96–106)
Creatinine, Ser: 1.39 mg/dL — ABNORMAL HIGH (ref 0.76–1.27)
Globulin, Total: 3.4 g/dL (ref 1.5–4.5)
Glucose: 90 mg/dL (ref 65–99)
Potassium: 5.1 mmol/L (ref 3.5–5.2)
Sodium: 146 mmol/L — ABNORMAL HIGH (ref 134–144)
Total Protein: 7.4 g/dL (ref 6.0–8.5)
eGFR: 49 mL/min/{1.73_m2} — ABNORMAL LOW (ref >59)

## 2021-02-09 LAB — C-REACTIVE PROTEIN: CRP: 13 mg/L — ABNORMAL HIGH (ref 0–10)

## 2021-02-09 LAB — LIPID PANEL
Chol/HDL Ratio: 3.9 ratio (ref 0.0–5.0)
Cholesterol, Total: 124 mg/dL (ref 100–199)
HDL: 32 mg/dL — ABNORMAL LOW (ref >39)
LDL Chol Calc (NIH): 59 mg/dL (ref 0–99)
Triglycerides: 203 mg/dL — ABNORMAL HIGH (ref 0–149)
VLDL Cholesterol Cal: 33 mg/dL (ref 5–40)

## 2021-02-09 LAB — AUTOIMMUNE PROFILE
Anti Nuclear Antibody (ANA): NEGATIVE
Complement C3, Serum: 163 mg/dL (ref 82–167)
dsDNA Ab: <1 IU/mL (ref 0–9)

## 2021-02-09 LAB — RHEUMATOID FACTOR: Rheumatoid fact SerPl-aCnc: 21.8 IU/mL — ABNORMAL HIGH (ref <14.0)

## 2021-02-09 LAB — T3, FREE: T3, Free: 3 pg/mL (ref 2.0–4.4)

## 2021-02-09 LAB — T4: T4, Total: 7.5 ug/dL (ref 4.5–12.0)

## 2021-02-09 LAB — TSH: TSH: 1.11 u[IU]/mL (ref 0.450–4.500)

## 2021-02-12 ENCOUNTER — Ambulatory Visit: Payer: Medicare HMO | Admitting: Nurse Practitioner

## 2021-02-19 DIAGNOSIS — M1712 Unilateral primary osteoarthritis, left knee: Secondary | ICD-10-CM | POA: Diagnosis not present

## 2021-02-20 ENCOUNTER — Telehealth: Payer: Medicare HMO

## 2021-02-20 ENCOUNTER — Other Ambulatory Visit: Payer: Self-pay | Admitting: Nurse Practitioner

## 2021-02-20 ENCOUNTER — Ambulatory Visit (INDEPENDENT_AMBULATORY_CARE_PROVIDER_SITE_OTHER): Payer: Medicare HMO

## 2021-02-20 DIAGNOSIS — R768 Other specified abnormal immunological findings in serum: Secondary | ICD-10-CM

## 2021-02-20 DIAGNOSIS — R4182 Altered mental status, unspecified: Secondary | ICD-10-CM

## 2021-02-20 DIAGNOSIS — M79641 Pain in right hand: Secondary | ICD-10-CM

## 2021-02-20 DIAGNOSIS — M25541 Pain in joints of right hand: Secondary | ICD-10-CM

## 2021-02-20 DIAGNOSIS — M25542 Pain in joints of left hand: Secondary | ICD-10-CM

## 2021-02-20 DIAGNOSIS — I10 Essential (primary) hypertension: Secondary | ICD-10-CM | POA: Diagnosis not present

## 2021-02-20 DIAGNOSIS — E7800 Pure hypercholesterolemia, unspecified: Secondary | ICD-10-CM

## 2021-02-20 DIAGNOSIS — G8929 Other chronic pain: Secondary | ICD-10-CM

## 2021-02-20 DIAGNOSIS — M25562 Pain in left knee: Secondary | ICD-10-CM

## 2021-02-24 DIAGNOSIS — I10 Essential (primary) hypertension: Secondary | ICD-10-CM | POA: Diagnosis not present

## 2021-02-26 NOTE — Chronic Care Management (AMB) (Signed)
Chronic Care Management   CCM RN Visit Note  02/20/2021 Name: Jesus Lam MRN: [id number] DOB: 16-Aug-1933  Subjective: Jesus Lam is a 87 y.o. year old male who is a primary care patient of [name], FNP. The care management team was consulted for assistance with disease management and care coordination needs.    Engaged with patient by telephone for follow up visit in response to provider referral for case management and/or care coordination services.   Consent to Services:  The patient was given information about Chronic Care Management services, agreed to services, and gave verbal consent prior to initiation of services.  Please see initial visit note for detailed documentation.   Patient agreed to services and verbal consent obtained.   Assessment: Review of patient past medical history, allergies, medications, health status, including review of consultants reports, laboratory and other test data, was performed as part of comprehensive evaluation and provision of chronic care management services.   SDOH (Social Determinants of Health) assessments and interventions performed:  Yes, no acute challenges   CCM Care Plan  Allergies  Allergen Reactions  . Shellfish Allergy Other (See Comments)    Gout     Outpatient Encounter Medications as of 02/20/2021  Medication Sig  . amLODipine (NORVASC) 5 MG tablet TAKE ONE TABLET BY MOUTH DAILY  . atorvastatin (LIPITOR) 80 MG tablet TAKE 1 TABLET ONE TIME DAILY  . bisoprolol (ZEBETA) 5 MG tablet TAKE HALF TABLET BY MOUTH DAILY  . chlorthalidone (HYGROTON) 25 MG tablet Take 1 tablet (25 mg total) by mouth daily. (Patient not taking: No sig reported)  . Cholecalciferol (VITAMIN D3) 5000 units CAPS Take 5,000 Units by mouth daily.  . clopidogrel (PLAVIX) 75 MG tablet TAKE ONE TABLET BY MOUTH DAILY  . diclofenac Sodium (VOLTAREN) 1 % GEL APPLY TWO GRAMS TOPICALLY FOUR TIMES A DAY  . donepezil (ARICEPT) 5 MG tablet TAKE 1 TABLET BY  MOUTH AT BEDTIME.  [name] (MEDICAL COMPRESSION STOCKINGS) MISC   . furosemide (LASIX) 20 MG tablet TAKE ONE TABLET BY MOUTH DAILY  . gabapentin (NEURONTIN) 300 MG capsule TAKE ONE CAPSULE BY MOUTH AT BEDTIME  . hydrALAZINE (APRESOLINE) 25 MG tablet Take 25 mg by mouth in the morning and at bedtime.  Marland Kitchen icosapent Ethyl (VASCEPA) 1 g capsule TAKE ONE CAPSULE BY MOUTH TWICE A DAY  . isosorbide dinitrate (ISORDIL) 30 MG tablet Take 30 mg by mouth 2 (two) times daily.  Marland Kitchen ketorolac (ACULAR) 0.5 % ophthalmic solution ketorolac 0.5 % eye drops  . Menthol-Methyl Salicylate (MUSCLE RUB) 10-15 % CREA muscle rub  . methimazole (TAPAZOLE) 10 MG tablet TAKE ONE TABLET BY MOUTH DAILY  . Multiple Vitamins-Minerals (COMPLETE SENIOR PO) 1 tablet daily.   . Omega-3 Fatty Acids (FISH OIL PO) Take 1,000 mg by mouth. (Patient not taking: Reported on 02/08/2021)  . tamsulosin (FLOMAX) 0.4 MG CAPS capsule TAKE ONE CAPSULE BY MOUTH DAILY HALF HOUR FOLLOWING THE SAME MEAL DAILY  . vitamin B-12 (CYANOCOBALAMIN) 500 MCG tablet Take 500 mcg by mouth daily.  . vitamin C (ASCORBIC ACID) 500 MG tablet Take 500 mg daily by mouth.   No facility-administered encounter medications on file as of 02/20/2021.    Patient Active Problem List   Diagnosis Date Noted  . Mild cognitive impairment 09/07/2020  . Chronic pain of left knee 03/15/2020  . Pure hypercholesterolemia 11/15/2019  . First degree AV block 07/28/2019  . Hypertriglyceridemia 04/29/2019  . Mixed hyperlipidemia 04/29/2019  . CVA (cerebral vascular  accident) (HCC)   . Bradycardia   . Subclinical hyperthyroidism 11/18/2018  . Peripheral artery disease (HCC) 06/13/2018  . Ischemic cerebrovascular accident (CVA) (HCC) 06/13/2018  . Dysarthria 06/12/2018  . Renal insufficiency 06/12/2018  . Claudication (HCC) 01/11/2018  . Anemia 10/10/2017  . Essential hypertension 11/04/2013    Conditions to be addressed/monitored:HTN, Hyperthyroidism,  Altered memory,  Bilateral hand pain, Medication management, Pure hypercholesterolemia, Chronic pain of left knee   Care Plan : Dementia (Adult)  Updates made by Riley Churches, RN since 02/26/2021 12:00 AM    Problem: Long-Term Care Planning   Priority: High    Long-Range Goal: Effective Long-Term Care Planning   Start Date: 02/20/2021  Expected End Date: 08/23/2021  This Visit's Progress: On track  Priority: High  Note:   Current Barriers:   Ineffective Self Health Maintenance  Memory deficits  Clinical Goal(s):  Marland Kitchen Collaboration with [name], FNP regarding development and update of comprehensive plan of care as evidenced by provider attestation and co-signature . Inter-disciplinary care team collaboration (see longitudinal plan of care)  patient will work with care management team to address care coordination and chronic disease management needs related to Disease Management  Educational Needs  Care Coordination  Medication Management and Education  Psychosocial Support   Interventions:   Evaluation of current treatment plan related to  memory deficit  , self-management and patient's adherence to plan as established by provider.  Collaboration with [name], FNP regarding development and update of comprehensive plan of care as evidenced by provider attestation       and co-signature  Inter-disciplinary care team collaboration (see longitudinal plan of care) . Provided education to patient about basic disease process for dementia  . Review of patient status, including review of consultants reports, relevant laboratory and other test results, and medications completed. . Reviewed medications with patient and discussed importance of medication adherence . Reviewed and discussed patients understanding of the head CT results provided by PCP concerning brain changes including brain atrophy   Discussed plans with patient for ongoing care management follow up and  provided patient with direct contact information for care management team Self Care Activities:  . Continue to keep all scheduled follow up appointments . Take medications as directed  . Let your healthcare team know if you are unable to take your medications . Call your pharmacy for refills at least 7 days prior to running out of medication Patient Goals: - maintain or improve memory  Follow Up Plan: Telephone follow up appointment with care management team member scheduled for: 03/28/21    Care Plan : Hypertriglyceridemia  Updates made by Riley Churches, RN since 02/26/2021 12:00 AM    Problem: Hypertriglyceridemia   Priority: Medium    Long-Range Goal: Hypertriglyceridemia - treatment optimized   Start Date: 02/20/2021  Expected End Date: 08/23/2021  This Visit's Progress: On track  Priority: Medium  Note:   Current Barriers:   Ineffective Self Health Maintenance  Memory deficits  Clinical Goal(s):  Marland Kitchen Collaboration with [name], FNP regarding development and update of comprehensive plan of care as evidenced by provider attestation and co-signature . Inter-disciplinary care team collaboration (see longitudinal plan of care)  patient will work with care management team to address care coordination and chronic disease management needs related to Disease Management  Educational Needs  Care Coordination  Medication Management and Education  Psychosocial Support   Interventions:   Evaluation of current treatment plan related to  elevated triglycerides , self-management  and patient's adherence to plan as established by provider.  Collaboration with [name], FNP regarding development and update of comprehensive plan of care as evidenced by provider attestation       and co-signature  Inter-disciplinary care team collaboration (see longitudinal plan of care) . Provided education to patient about basic disease process related to elevated triglycerides  . Educated  patient on dietary and exercise recommendations . Review of patient status, including review of consultants reports, relevant laboratory and other test results, and medications completed. . Reviewed medications with patient and discussed importance of medication adherence . Mailed printed educational materials related to lowering Cholesterol and Triglycerides   Discussed plans with patient for ongoing care management follow up and provided patient with direct contact information for care management team Self Care Activities:  . Continue to keep all scheduled follow up appointments . Take medications as directed  . Let your healthcare team know if you are unable to take your medications . Call your pharmacy for refills at least 7 days prior to running out of medication Patient Goals: - lower Triglycerides and increase HDL - review patient educational materials related to elevated Triglycerides  Follow Up Plan:  Telephone follow up appointment with care management team member scheduled for: 03/28/21   Care Plan : Rheumatoid Arthritis /Osteoarthritis (Adult)  Updates made by Riley Churches, RN since 02/26/2021 12:00 AM    Problem: Mobility and Function (Osteoarthritis)   Priority: High    Long-Range Goal: Maintain Mobility and Function   Start Date: 02/20/2021  Expected End Date: 08/23/2021  This Visit's Progress: On track  Priority: High  Note:   Current Barriers:   Ineffective Self Health Maintenance  Memory deficits  Clinical Goal(s):  Marland Kitchen Collaboration with [name], FNP regarding development and update of comprehensive plan of care as evidenced by provider attestation and co-signature . Inter-disciplinary care team collaboration (see longitudinal plan of care)  patient will work with care management team to address care coordination and chronic disease management needs related to Disease Management  Educational Needs  Care Coordination  Medication Management and  Education  Psychosocial Support   Interventions:   Evaluation of current treatment plan related to Chronic pain of left knee, self-management and patient's adherence to plan as established by provider.  Collaboration with [name], FNP regarding development and update of comprehensive plan of care as evidenced by provider attestation       and co-signature  Inter-disciplinary care team collaboration (see longitudinal plan of care) . Provided education to patient about basic Arthritis disease process . Review of patient status, including review of consultants reports, relevant laboratory and other test results, and medications completed. . Reviewed medications with patient and discussed importance of medication adherence . Discussed PCP recommendations for referral to Rheumatology, patient is agreeable . Collaboration with PCP regarding referral status for Rheumatology, PCP will send referral   Discussed plans with patient for ongoing care management follow up and provided patient with direct contact information for care management team Self Care Activities:  . Continue to adhere to MD recommendations for CKD  . Continue to keep all scheduled follow up appointments . Take medications as directed  . Let your healthcare team know if you are unable to take your medications . Call your pharmacy for refills at least 7 days prior to running out of medication Patient Goals: -follow up with Rheumatologist for evaluation and treatment of arthritis  Follow Up Plan: Telephone follow up appointment with care management team  member scheduled for: 03/28/21    Plan:Telephone follow up appointment with care management team member scheduled for:  03/28/21  [name], RN, BSN, CCM Care Management Coordinator [facility] Care Management/[facility]  Direct Phone: [phone]

## 2021-02-26 NOTE — Patient Instructions (Signed)
Goals Addressed    Other   .  Effective Long Term Care Planning   On track     Timeframe:  Long-Range Goal Priority:  High Start Date: 02/20/21                             Expected End Date:  08/23/21   Next Scheduled Follow up date: 03/28/21   Self Care Activities:  . Continue to keep all scheduled follow up appointments . Take medications as directed  . Let your healthcare team know if you are unable to take your medications . Call your pharmacy for refills at least 7 days prior to running out of medication Patient Goals: - maintain or improve memory     .  Hypertriglyceridemia - treatment optimized   On track     Timeframe:  Long-Range Goal Priority:  Medium Start Date:  02/20/21                           Expected End Date:  08/23/21       Next Scheduled Follow Up date: 03/28/21   Self Care Activities:  . Continue to keep all scheduled follow up appointments . Take medications as directed  . Let your healthcare team know if you are unable to take your medications . Call your pharmacy for refills at least 7 days prior to running out of medication Patient Goals: - lower Triglycerides and increase HDL - review patient educational materials related to elevated Triglycerides     .  Maintain Mobility and Function   On track     Timeframe:  Long-Range Goal Priority:  High Start Date:  02/20/21                           Expected End Date: 08/23/21     Next Scheduled Follow Up date: 03/28/21  Self Care Activities:  . Continue to adhere to MD recommendations for CKD  . Continue to keep all scheduled follow up appointments . Take medications as directed  . Let your healthcare team know if you are unable to take your medications . Call your pharmacy for refills at least 7 days prior to running out of medication Patient Goals: -follow up with Rheumatologist for evaluation and treatment of arthritis

## 2021-03-17 IMAGING — CT CT HEAD W/O CM
1 series · 15 of 30 positions shown, 19 images · non-contrast
Comparison: Brain MRI 06/13/2018.

CLINICAL DATA: Memory changes.  Memory loss; history of CVA.

EXAM:
CT HEAD WITHOUT CONTRAST
TECHNIQUE: Contiguous axial images were obtained from the base of the skull
through the vertex without intravenous contrast.

[Series 2: head w/(date) · axial · 0.49mm/px · z∈[-178,-18]mm · 15 of 36 slices shown, 19 images]
[im 2/36  brain]
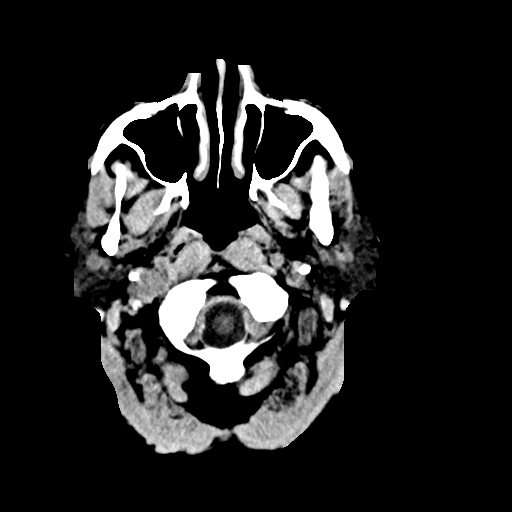
[im 2/36  bone]
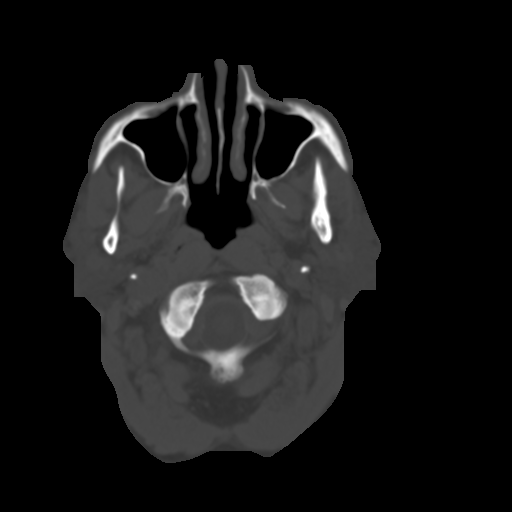
[im 4/36  brain]
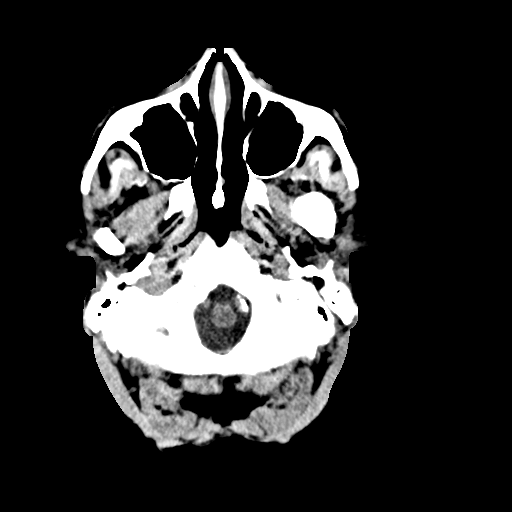
[im 7/36  brain]
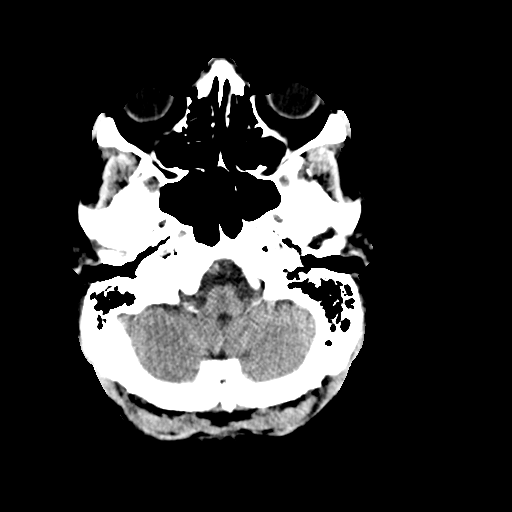
[im 9/36  brain]
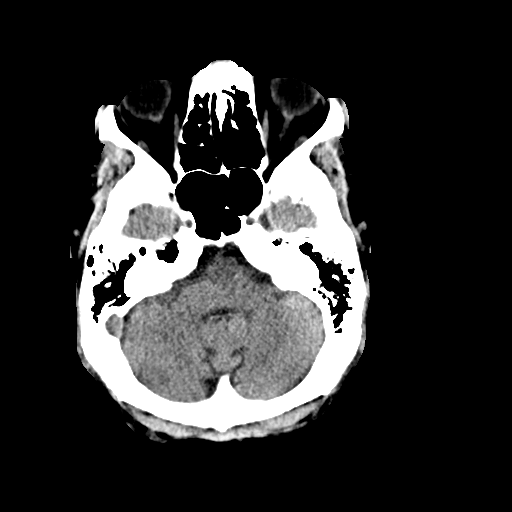
[im 11/36  brain]
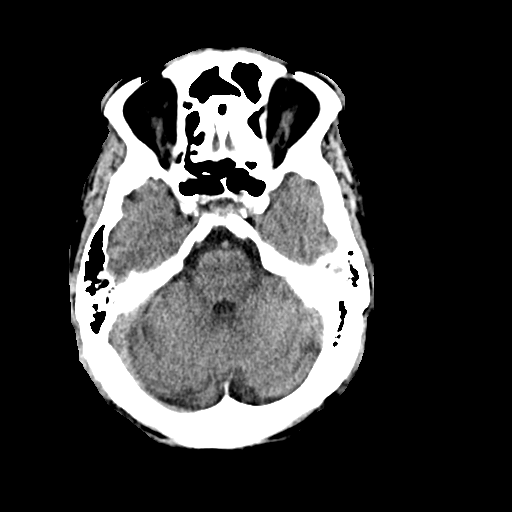
[im 11/36  bone]
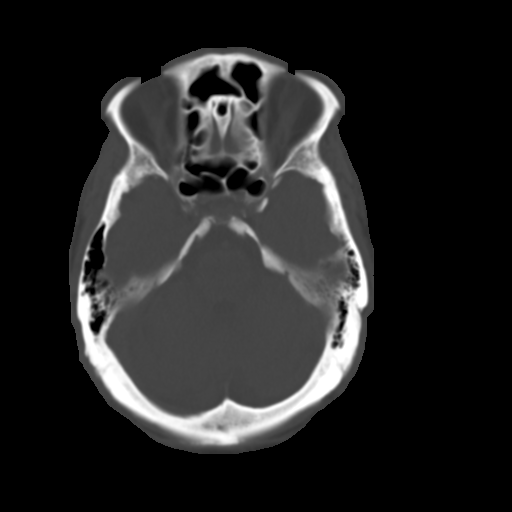
[im 14/36  brain]
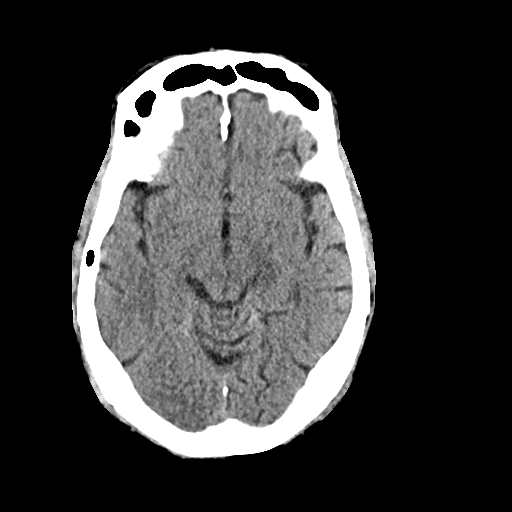
[im 16/36  brain]
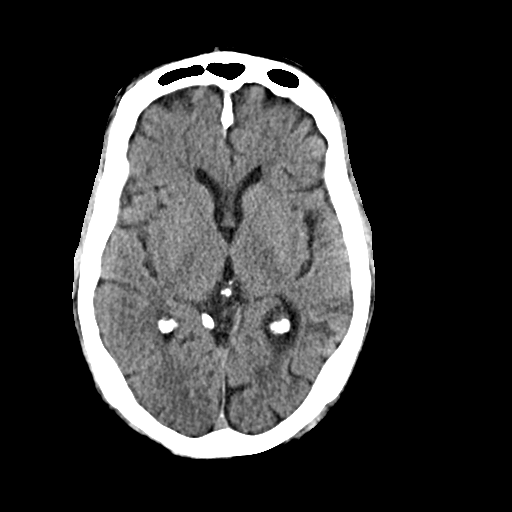
[im 19/36  brain]
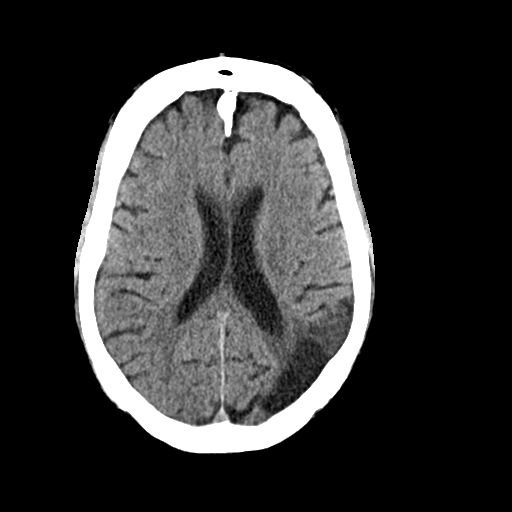
[im 20/36  brain]
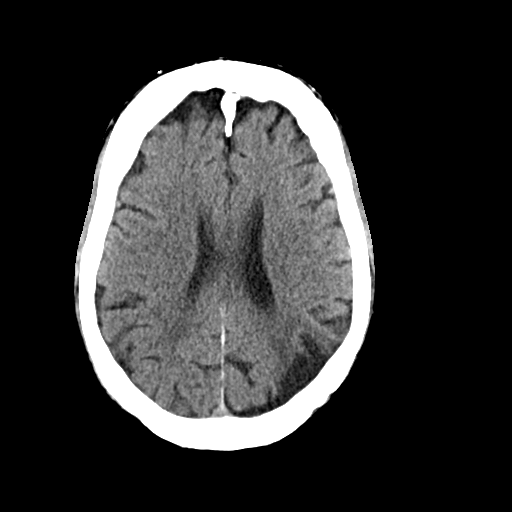
[im 20/36  bone]
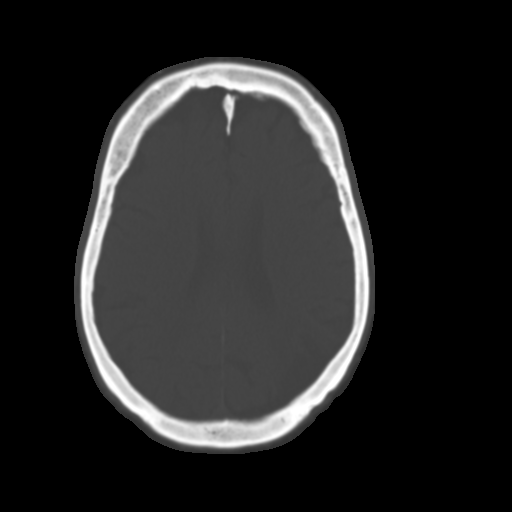
[im 22/36  brain]
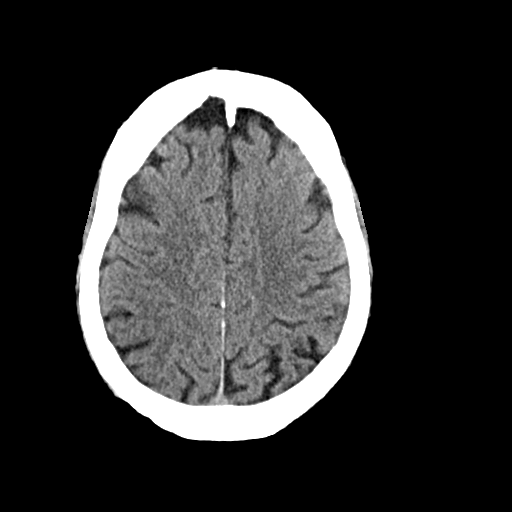
[im 25/36  brain]
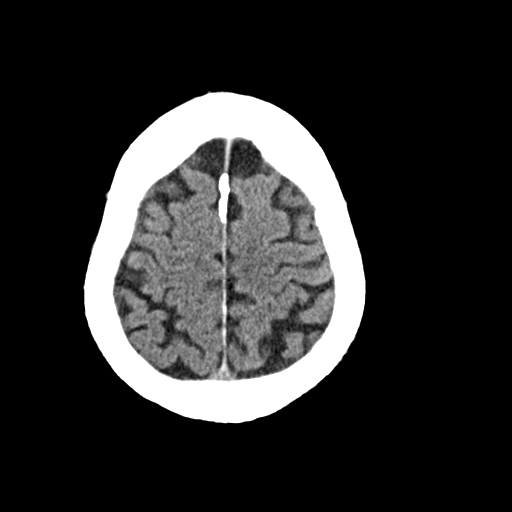
[im 27/36  brain]
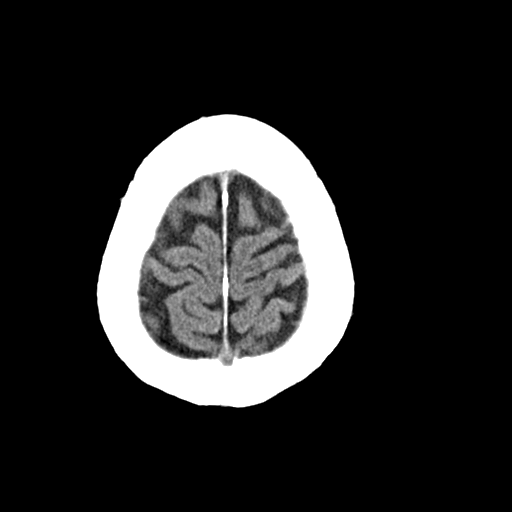
[im 29/36  brain]
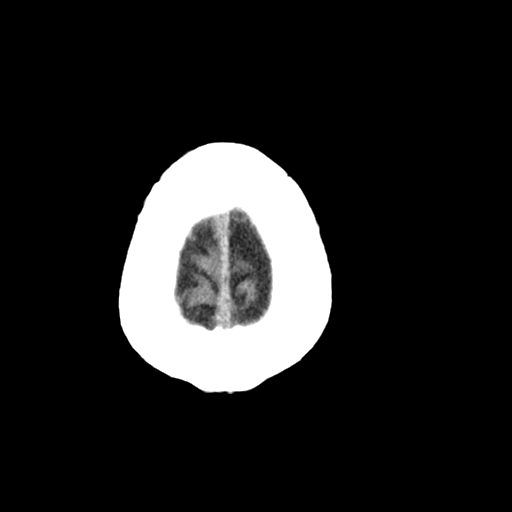
[im 29/36  bone]
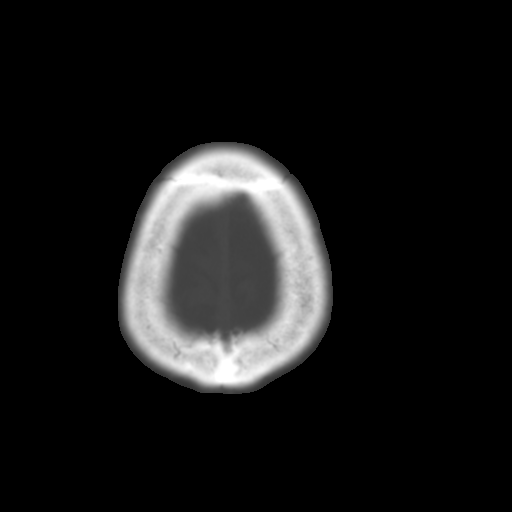
[im 32/36  brain]
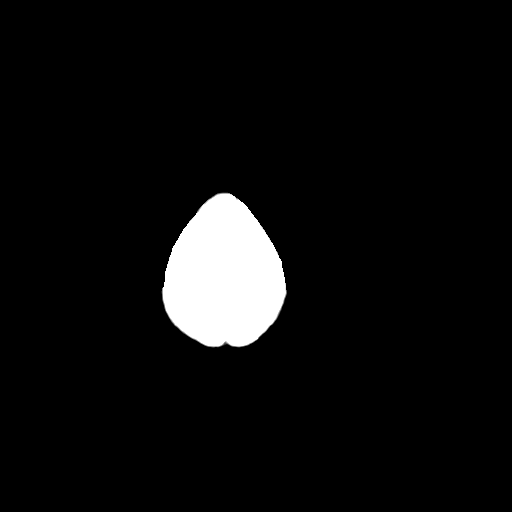
[im 34/36  brain]
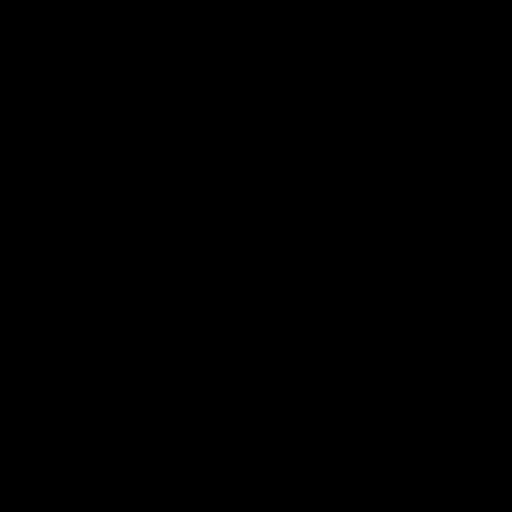

[15 of 30 positions shown; findings below may reference images not displayed]

FINDINGS: Brain:

Mild cerebral atrophy, not unexpected for patient age.

Redemonstrated chronic cortically based infarct within the posterior
cerebral hemisphere within portions of the left temporal, parietal
and occipital lobes.

Minimal ill-defined hypoattenuation within the cerebral white matter
is nonspecific, but compatible with chronic small vessel ischemic
disease.

There is no acute intracranial hemorrhage.

No acute demarcated cortical infarct.

No extra-axial fluid collection.

No evidence of intracranial mass.

No midline shift.

Vascular: No hyperdense vessel.  Atherosclerotic calcifications.

Skull: Normal. Negative for fracture or focal lesion.

Sinuses/Orbits: Visualized orbits show no acute finding. Trace
ethmoid sinus mucosal thickening.

Other: Redemonstrated chronic anterior dislocation of the left
mandibular condyle.
IMPRESSION: No evidence of acute intracranial abnormality.

Redemonstrated chronic cortically-based infarct within the left
temporal, parietal and occipital lobes.

Mild cerebral atrophy which is not unexpected for patient age.

Mild cerebral white matter chronic small vessel ischemic disease.

Chronic anterior dislocation of the left mandibular condyle.

## 2021-03-26 DIAGNOSIS — I10 Essential (primary) hypertension: Secondary | ICD-10-CM | POA: Diagnosis not present

## 2021-03-27 DIAGNOSIS — M1712 Unilateral primary osteoarthritis, left knee: Secondary | ICD-10-CM | POA: Diagnosis not present

## 2021-03-28 ENCOUNTER — Telehealth: Payer: Medicare HMO

## 2021-04-02 ENCOUNTER — Other Ambulatory Visit: Payer: Self-pay | Admitting: Cardiology

## 2021-04-02 ENCOUNTER — Other Ambulatory Visit: Payer: Self-pay | Admitting: Nurse Practitioner

## 2021-04-02 DIAGNOSIS — I739 Peripheral vascular disease, unspecified: Secondary | ICD-10-CM

## 2021-04-06 NOTE — Progress Notes (Signed)
Office Visit Note  Patient: Jesus Lam             Date of Birth: 04/05/1933           MRN: [id number]             PCP: [name], FNP Referring: [name], FNP Visit Date: 04/17/2021 Occupation: @GUAROCC @  Subjective:  Pain in multiple joints.   History of Present Illness: Jesus Lam is a 87 y.o. male seen in consultation per request of his PCP.  According to the patient about a year ago he started having increased pain and swelling in his hands which is being persistent.  He has difficulty buttoning his shirt and also grasping objects.  He also gives history of left knee joint pain and discomfort for the last 10 years.  He states pain has been progressively getting worse.  He has seen an orthopedic surgeon in the past.  Recently has been having pain in his right knee.  He states his pain in his feet off and on.  He had some labs done recently by his PCP which showed positive rheumatoid factor and he was referred to me.  Activities of Daily Living:  Patient reports morning stiffness for 5 minutes.   Patient Denies nocturnal pain.  Difficulty dressing/grooming: Reports Difficulty climbing stairs: Denies Difficulty getting out of chair: Reports Difficulty using hands for taps, buttons, cutlery, and/or writing: Reports  Review of Systems  Constitutional: Negative for fatigue and night sweats.  HENT: Negative for mouth sores, mouth dryness and nose dryness.   Eyes: Negative for pain, redness, itching and dryness.  Respiratory: Negative for shortness of breath and difficulty breathing.   Cardiovascular: Negative for chest pain, palpitations, hypertension, irregular heartbeat and swelling in legs/feet.  Gastrointestinal: Negative for blood in stool, constipation and diarrhea.  Endocrine: Negative for increased urination.  Genitourinary: Negative for difficulty urinating.  Musculoskeletal: Positive for arthralgias, joint pain, joint swelling, myalgias, morning stiffness,  muscle tenderness and myalgias. Negative for muscle weakness.  Skin: Negative for color change, rash, hair loss, nodules/bumps, redness, skin tightness, ulcers and sensitivity to sunlight.  Allergic/Immunologic: Negative for susceptible to infections.  Neurological: Positive for numbness and memory loss. Negative for dizziness, fainting, headaches, night sweats and weakness.  Hematological: Positive for bruising/bleeding tendency. Negative for swollen glands.  Psychiatric/Behavioral: Positive for confusion. Negative for depressed mood and sleep disturbance. The patient is not nervous/anxious.     PMFS History:  Patient Active Problem List   Diagnosis Date Noted  . Mild cognitive impairment 09/07/2020  . Chronic pain of left knee 03/15/2020  . Pure hypercholesterolemia 11/15/2019  . First degree AV block 07/28/2019  . Hypertriglyceridemia 04/29/2019  . Mixed hyperlipidemia 04/29/2019  . CVA (cerebral vascular accident) (HCC)   . Bradycardia   . Subclinical hyperthyroidism 11/18/2018  . Peripheral artery disease (HCC) 06/13/2018  . Ischemic cerebrovascular accident (CVA) (HCC) 06/13/2018  . Dysarthria 06/12/2018  . Renal insufficiency 06/12/2018  . Claudication (HCC) 01/11/2018  . Anemia 10/10/2017  . Essential hypertension 11/04/2013    Past Medical History:  Diagnosis Date  . Anemia   . BPH (benign prostatic hyperplasia)   . Cataracts, bilateral   . CVA (cerebral vascular accident) (HCC)   . DVT (deep venous thrombosis) (HCC)    dvt in left leg  . Dyslipidemia   . Gout   . Gout   . HTN (hypertension)   . Left hip pain 03/15/2020  . Left leg pain   . Osteoarthritis   .  PAD (peripheral artery disease) (HCC)   . Stasis dermatitis   . Stroke (HCC)   . Vitamin D deficiency     Family History  Problem Relation Age of Onset  . Cancer Mother   . Cancer Father   . Alcohol abuse Father   . Breast cancer Daughter   . Stroke Daughter   . CVA Daughter    Past Surgical  History:  Procedure Laterality Date  . ABDOMINAL AORTOGRAM N/A 01/13/2018   Procedure: ABDOMINAL AORTOGRAM;  Surgeon: Elder Negus, MD;  Location: [facility];  Service: Cardiovascular;  Laterality: N/A;  . CATARACT EXTRACTION, BILATERAL    . LEFT HEART CATH AND CORONARY ANGIOGRAPHY N/A 01/13/2018   Procedure: LEFT HEART CATH AND CORONARY ANGIOGRAPHY;  Surgeon: Elder Negus, MD;  Location: [facility];  Service: Cardiovascular;  Laterality: N/A;  . LOWER EXTREMITY ANGIOGRAPHY N/A 01/13/2018   Procedure: LOWER EXTREMITY ANGIOGRAPHY;  Surgeon: Elder Negus, MD;  Location: [facility];  Service: Cardiovascular;  Laterality: N/A;  . LOWER EXTREMITY ANGIOGRAPHY Right 01/27/2018   Procedure: LOWER EXTREMITY ANGIOGRAPHY;  Surgeon: Yates Decamp, MD;  Location: [facility];  Service: Cardiovascular;  Laterality: Right;  . PERIPHERAL VASCULAR ATHERECTOMY Left 01/13/2018   Procedure: PERIPHERAL VASCULAR ATHERECTOMY;  Surgeon: Elder Negus, MD;  Location: [facility];  Service: Cardiovascular;  Laterality: Left;  SFA WITH PTA DRUG COATED BALLOON  . PERIPHERAL VASCULAR INTERVENTION  01/27/2018   Procedure: PERIPHERAL VASCULAR INTERVENTION;  Surgeon: Yates Decamp, MD;  Location: [facility];  Service: Cardiovascular;;   Social History   Social History Narrative   ** Merged History Encounter **       Lives with daughter, [name]. 2 daughters live here in [place]. Son lives in [place]. 3/16- offered information on local food pantries as well as congregate meal sites. Information declined.   Immunization History  Administered Date(s) Administered  . Fluad Quad(high Dose 65+) 08/03/2020  . Influenza, High Dose Seasonal PF 10/29/2018, 08/11/2019  . Moderna SARS-COV2 Booster Vaccination 11/15/2020  . PFIZER(Purple Top)SARS-COV-2 Vaccination 01/17/2020, 01/29/2020     Objective: Vital Signs: BP (!) 155/80 (BP Location: Right Arm,  Patient Position: Sitting, Cuff Size: Normal)   Pulse 62   Resp 17   Ht  (1.803 m)   Wt 235 lb (106.6 kg)   BMI 32.78 kg/m    Physical Exam Vitals and nursing note reviewed.  Constitutional:      Appearance: He is well-developed.  HENT:     Head: Normocephalic and atraumatic.  Eyes:     Conjunctiva/sclera: Conjunctivae normal.     Pupils: Pupils are equal, round, and reactive to light.  Cardiovascular:     Rate and Rhythm: Normal rate and regular rhythm.     Heart sounds: Normal heart sounds.  Pulmonary:     Effort: Pulmonary effort is normal.     Breath sounds: Normal breath sounds.  Abdominal:     General: Bowel sounds are normal.     Palpations: Abdomen is soft.  Musculoskeletal:     Cervical back: Normal range of motion and neck supple.  Skin:    General: Skin is warm and dry.     Capillary Refill: Capillary refill takes less than 2 seconds.  Neurological:     Mental Status: He is alert and oriented to person, place, and time.  Psychiatric:        Behavior: Behavior normal.  Musculoskeletal Exam: C-spine was in good range of motion.  Shoulder joints, elbow joints, wrist joints with good range of motion.  He had no tenderness or synovitis over MCPs.  He has thickening of PIP and DIP joints with no synovitis.  Hip joints and knee joints with good range of motion.  He had discomfort range of motion of his left knee joint without any warmth swelling or effusion.  He had previous fracture to his right ankle joint.  He had no synovitis over his MTPs or PIPs but complains of chronic discomfort.  CDAI Exam: CDAI Score: -- Patient Global: --; Provider Global: -- Swollen: --; Tender: -- Joint Exam 04/17/2021   No joint exam has been documented for this visit   There is currently no information documented on the homunculus. Go to the Rheumatology activity and complete the homunculus joint exam.  Investigation: No additional findings.  Imaging: XR Foot 2 Views  Left  Result Date: 04/17/2021 First MTP, PIP and DIP narrowing was noted.  Dorsal spurring was noted.  No tibiotalar narrowing was noted.  No erosive changes were noted. Impression: These findings are consistent with osteoarthritis of the foot.  XR Foot 2 Views Right  Result Date: 04/17/2021 First MTP, PIP and DIP narrowing was noted.  Dorsal spurring was noted.  No tibiotalar narrowing was noted.  Subtalar narrowing was noted.  Inferior calcaneal spur was noted. Impression: These findings are consistent with osteoarthritis of the foot.  Subtalar narrowing could be related to previous injury.  XR Hand 2 View Left  Result Date: 04/17/2021 CMC, PIP and DIP narrowing was noted.  No MCP, intercarpal or radiocarpal joint space narrowing was noted.  No erosive changes were noted. Impression: These findings are consistent with osteoarthritis of the hand.  XR Hand 2 View Right  Result Date: 04/17/2021 CMC, PIP and DIP narrowing was noted.  No MCP, intercarpal or radiocarpal joint space narrowing was noted.  No erosive changes were noted. Impression: These findings are consistent with osteoarthritis of the hand.  XR KNEE 3 VIEW LEFT  Result Date: 04/17/2021 Severe medial compartment narrowing was noted.  Intercondylar osteophytes and medial osteophytes were noted.  Severe patellofemoral narrowing was noted.  Calcification of the blood vessels was noted. Impression: These findings are consistent with end-stage osteoarthritis and severe chondromalacia patella.   Recent Labs: Lab Results  Component Value Date   WBC 6.2 01/28/2019   HGB 11.7 (L) 01/28/2019   PLT 194 01/28/2019   NA 146 (H) 02/08/2021   K 5.1 02/08/2021   CL 108 (H) 02/08/2021   CO2 23 02/08/2021   GLUCOSE 90 02/08/2021   BUN 15 02/08/2021   CREATININE 1.39 (H) 02/08/2021   BILITOT 0.5 02/08/2021   ALKPHOS 93 02/08/2021   AST 21 02/08/2021   ALT 17 02/08/2021   PROT 7.4 02/08/2021   ALBUMIN 4.0 02/08/2021   CALCIUM 9.5  02/08/2021   GFRAA 64 08/03/2020    Speciality Comments: No specialty comments available.  Procedures:  No procedures performed Allergies: Shellfish allergy   Assessment / Plan:     Visit Diagnoses: Pain in both hands -he complains of pain and discomfort in his bilateral hands.  He has difficulty making a fist.  He also gives history of intermittent swelling.  No synovitis was noted.  DIP and PIP thickening was noted.  Plan: XR Hand 2 View Right, XR Hand 2 View Left x-rays findings were consistent with osteoarthritis of bilateral hands.  A handout on hand exercises was  given.  Rheumatoid factor positive - 02/08/21: RF 21.8, CRP 13, dsDNA<1, C3 163, ANA negative, TSH 1.11, T4 7.5, T3 3.0.  He has positive rheumatoid factor but I do not see any synovitis on examination.  If he has persistent pain and discomfort I may consider obtaining ultrasound of his bilateral hands to look for synovitis.  I  Chronic pain of left knee -he complains of left knee joint discomfort for many years.  He has been seen by orthopedic surgeon in the past.  He states he was advised knee joint surgery.  No warmth swelling or effusion was noted.  Plan: XR KNEE 3 VIEW LEFT.  X-ray showed end-stage severe osteoarthritis and severe patellofemoral narrowing consistent with chondromalacia patella.  A handout on knee strengthening exercises was given.  He is currently not prepared to have total knee replacement but he has seen an orthopedic surgeon in the past.  Pain in both feet -he complains of discomfort in his bilateral feet.  He states he had left ankle joint fracture in the past.  He walks with right foot external rotation.  Plan: XR Foot 2 Views Right, XR Foot 2 Views Left.  X-ray showed bilateral osteoarthritis and right ankle showed posttraumatic changes.  Other medical problems are listed as follows:  Renal insufficiency  Essential hypertension  First degree AV block  Ischemic cerebrovascular accident (CVA)  (HCC)  Pure hypercholesterolemia  Peripheral artery disease (HCC)  Claudication (HCC)  Subclinical hyperthyroidism  Orders: Orders Placed This Encounter  Procedures  . XR Hand 2 View Right  . XR Hand 2 View Left  . XR KNEE 3 VIEW LEFT  . XR Foot 2 Views Right  . XR Foot 2 Views Left   No orders of the defined types were placed in this encounter.     Follow-Up Instructions: Return for Pain in multiplr joints.   Pollyann Savoy, MD  Note - This record has been created using Animal nutritionist.  Chart creation errors have been sought, but may not always  have been located. Such creation errors do not reflect on  the standard of medical care.

## 2021-04-17 ENCOUNTER — Ambulatory Visit: Payer: Self-pay

## 2021-04-17 ENCOUNTER — Encounter (INDEPENDENT_AMBULATORY_CARE_PROVIDER_SITE_OTHER): Payer: Self-pay

## 2021-04-17 ENCOUNTER — Encounter: Payer: Self-pay | Admitting: Rheumatology

## 2021-04-17 ENCOUNTER — Other Ambulatory Visit: Payer: Self-pay

## 2021-04-17 ENCOUNTER — Ambulatory Visit (INDEPENDENT_AMBULATORY_CARE_PROVIDER_SITE_OTHER): Payer: Medicare HMO | Admitting: Rheumatology

## 2021-04-17 VITALS — BP 155/80 | HR 62 | Resp 17 | Ht 71.0 in | Wt 235.0 lb

## 2021-04-17 DIAGNOSIS — E0590 Thyrotoxicosis, unspecified without thyrotoxic crisis or storm: Secondary | ICD-10-CM

## 2021-04-17 DIAGNOSIS — R768 Other specified abnormal immunological findings in serum: Secondary | ICD-10-CM

## 2021-04-17 DIAGNOSIS — M25562 Pain in left knee: Secondary | ICD-10-CM

## 2021-04-17 DIAGNOSIS — M79672 Pain in left foot: Secondary | ICD-10-CM | POA: Diagnosis not present

## 2021-04-17 DIAGNOSIS — M79671 Pain in right foot: Secondary | ICD-10-CM | POA: Diagnosis not present

## 2021-04-17 DIAGNOSIS — I440 Atrioventricular block, first degree: Secondary | ICD-10-CM | POA: Diagnosis not present

## 2021-04-17 DIAGNOSIS — M79641 Pain in right hand: Secondary | ICD-10-CM

## 2021-04-17 DIAGNOSIS — I10 Essential (primary) hypertension: Secondary | ICD-10-CM

## 2021-04-17 DIAGNOSIS — N289 Disorder of kidney and ureter, unspecified: Secondary | ICD-10-CM

## 2021-04-17 DIAGNOSIS — I639 Cerebral infarction, unspecified: Secondary | ICD-10-CM

## 2021-04-17 DIAGNOSIS — G8929 Other chronic pain: Secondary | ICD-10-CM | POA: Diagnosis not present

## 2021-04-17 DIAGNOSIS — M79642 Pain in left hand: Secondary | ICD-10-CM

## 2021-04-17 DIAGNOSIS — E7800 Pure hypercholesterolemia, unspecified: Secondary | ICD-10-CM

## 2021-04-17 DIAGNOSIS — I739 Peripheral vascular disease, unspecified: Secondary | ICD-10-CM

## 2021-04-17 NOTE — Patient Instructions (Signed)
Journal for Nurse Practitioners, 15(4), 263-267. Retrieved August 31, 2018 from http://clinicalkey.com/nursing">  Knee Exercises Ask your health care provider which exercises are safe for you. Do exercises exactly as told by your health care provider and adjust them as directed. It is normal to feel mild stretching, pulling, tightness, or discomfort as you do these exercises. Stop right away if you feel sudden pain or your pain gets worse. Do not begin these exercises until told by your health care provider. Stretching and range-of-motion exercises These exercises warm up your muscles and joints and improve the movement and flexibility of your knee. These exercises also help to relieve pain and swelling. Knee extension, prone 1. Lie on your abdomen (prone position) on a bed. 2. Place your left / right knee just beyond the edge of the surface so your knee is not on the bed. You can put a towel under your left / right thigh just above your kneecap for comfort. 3. Relax your leg muscles and allow gravity to straighten your knee (extension). You should feel a stretch behind your left / right knee. 4. Hold this position for __________ seconds. 5. Scoot up so your knee is supported between repetitions. Repeat __________ times. Complete this exercise __________ times a day. Knee flexion, active 1. Lie on your back with both legs straight. If this causes back discomfort, bend your left / right knee so your foot is flat on the floor. 2. Slowly slide your left / right heel back toward your buttocks. Stop when you feel a gentle stretch in the front of your knee or thigh (flexion). 3. Hold this position for __________ seconds. 4. Slowly slide your left / right heel back to the starting position. Repeat __________ times. Complete this exercise __________ times a day.   Quadriceps stretch, prone 1. Lie on your abdomen on a firm surface, such as a bed or padded floor. 2. Bend your left / right knee and hold  your ankle. If you cannot reach your ankle or pant leg, loop a belt around your foot and grab the belt instead. 3. Gently pull your heel toward your buttocks. Your knee should not slide out to the side. You should feel a stretch in the front of your thigh and knee (quadriceps). 4. Hold this position for __________ seconds. Repeat __________ times. Complete this exercise __________ times a day.   Hamstring, supine 1. Lie on your back (supine position). 2. Loop a belt or towel over the ball of your left / right foot. The ball of your foot is on the walking surface, right under your toes. 3. Straighten your left / right knee and slowly pull on the belt to raise your leg until you feel a gentle stretch behind your knee (hamstring). ? Do not let your knee bend while you do this. ? Keep your other leg flat on the floor. 4. Hold this position for __________ seconds. Repeat __________ times. Complete this exercise __________ times a day. Strengthening exercises These exercises build strength and endurance in your knee. Endurance is the ability to use your muscles for a long time, even after they get tired. Quadriceps, isometric This exercise stretches the muscles in front of your thigh (quadriceps) without moving your knee joint (isometric). 1. Lie on your back with your left / right leg extended and your other knee bent. Put a rolled towel or small pillow under your knee if told by your health care provider. 2. Slowly tense the muscles in the front of your   left / right thigh. You should see your kneecap slide up toward your hip or see increased dimpling just above the knee. This motion will push the back of the knee toward the floor. 3. For __________ seconds, hold the muscle as tight as you can without increasing your pain. 4. Relax the muscles slowly and completely. Repeat __________ times. Complete this exercise __________ times a day.   Straight leg raises This exercise stretches the muscles in  front of your thigh (quadriceps) and the muscles that move your hips (hip flexors). 1. Lie on your back with your left / right leg extended and your other knee bent. 2. Tense the muscles in the front of your left / right thigh. You should see your kneecap slide up or see increased dimpling just above the knee. Your thigh may even shake a bit. 3. Keep these muscles tight as you raise your leg 4-6 inches (10-15 cm) off the floor. Do not let your knee bend. 4. Hold this position for __________ seconds. 5. Keep these muscles tense as you lower your leg. 6. Relax your muscles slowly and completely after each repetition. Repeat __________ times. Complete this exercise __________ times a day. Hamstring, isometric 1. Lie on your back on a firm surface. 2. Bend your left / right knee about __________ degrees. 3. Dig your left / right heel into the surface as if you are trying to pull it toward your buttocks. Tighten the muscles in the back of your thighs (hamstring) to "dig" as hard as you can without increasing any pain. 4. Hold this position for __________ seconds. 5. Release the tension gradually and allow your muscles to relax completely for __________ seconds after each repetition. Repeat __________ times. Complete this exercise __________ times a day. Hamstring curls If told by your health care provider, do this exercise while wearing ankle weights. Begin with __________ lb weights. Then increase the weight by 1 lb (0.5 kg) increments. Do not wear ankle weights that are more than __________ lb. 1. Lie on your abdomen with your legs straight. 2. Bend your left / right knee as far as you can without feeling pain. Keep your hips flat against the floor. 3. Hold this position for __________ seconds. 4. Slowly lower your leg to the starting position. Repeat __________ times. Complete this exercise __________ times a day.   Squats This exercise strengthens the muscles in front of your thigh and knee  (quadriceps). 1. Stand in front of a table, with your feet and knees pointing straight ahead. You may rest your hands on the table for balance but not for support. 2. Slowly bend your knees and lower your hips like you are going to sit in a chair. ? Keep your weight over your heels, not over your toes. ? Keep your lower legs upright so they are parallel with the table legs. ? Do not let your hips go lower than your knees. ? Do not bend lower than told by your health care provider. ? If your knee pain increases, do not bend as low. 3. Hold the squat position for __________ seconds. 4. Slowly push with your legs to return to standing. Do not use your hands to pull yourself to standing. Repeat __________ times. Complete this exercise __________ times a day. Wall slides This exercise strengthens the muscles in front of your thigh and knee (quadriceps). 1. Lean your back against a smooth wall or door, and walk your feet out 18-24 inches (46-61 cm) from it. 2.   Place your feet hip-width apart. 3. Slowly slide down the wall or door until your knees bend __________ degrees. Keep your knees over your heels, not over your toes. Keep your knees in line with your hips. 4. Hold this position for __________ seconds. Repeat __________ times. Complete this exercise __________ times a day.   Straight leg raises This exercise strengthens the muscles that rotate the leg at the hip and move it away from your body (hip abductors). 1. Lie on your side with your left / right leg in the top position. Lie so your head, shoulder, knee, and hip line up. You may bend your bottom knee to help you keep your balance. 2. Roll your hips slightly forward so your hips are stacked directly over each other and your left / right knee is facing forward. 3. Leading with your heel, lift your top leg 4-6 inches (10-15 cm). You should feel the muscles in your outer hip lifting. ? Do not let your foot drift forward. ? Do not let your  knee roll toward the ceiling. 4. Hold this position for __________ seconds. 5. Slowly return your leg to the starting position. 6. Let your muscles relax completely after each repetition. Repeat __________ times. Complete this exercise __________ times a day.   Straight leg raises This exercise stretches the muscles that move your hips away from the front of the pelvis (hip extensors). 1. Lie on your abdomen on a firm surface. You can put a pillow under your hips if that is more comfortable. 2. Tense the muscles in your buttocks and lift your left / right leg about 4-6 inches (10-15 cm). Keep your knee straight as you lift your leg. 3. Hold this position for __________ seconds. 4. Slowly lower your leg to the starting position. 5. Let your leg relax completely after each repetition. Repeat __________ times. Complete this exercise __________ times a day. This information is not intended to replace advice given to you by your health care provider. Make sure you discuss any questions you have with your health care provider. Document Revised: 09/01/2018 Document Reviewed: 09/01/2018 [employer] Patient Education  2021 [employer] Hand Exercises Hand exercises can be helpful for almost anyone. These exercises can strengthen the hands, improve flexibility and movement, and increase blood flow to the hands. These results can make work and daily tasks easier. Hand exercises can be especially helpful for people who have joint pain from arthritis or have nerve damage from overuse (carpal tunnel syndrome). These exercises can also help people who have injured a hand. Exercises Most of these hand exercises are gentle stretching and motion exercises. It is usually safe to do them often throughout the day. Warming up your hands before exercise may help to reduce stiffness. You can do this with gentle massage or by placing your hands in warm water for 10-15 minutes. It is normal to feel some stretching,  pulling, tightness, or mild discomfort as you begin new exercises. This will gradually improve. Stop an exercise right away if you feel sudden, severe pain or your pain gets worse. Ask your health care provider which exercises are best for you. Knuckle bend or "claw" fist 1. Stand or sit with your arm, hand, and all five fingers pointed straight up. Make sure to keep your wrist straight during the exercise. 2. Gently bend your fingers down toward your palm until the tips of your fingers are touching the top of your palm. Keep your big knuckle straight and just bend the   small knuckles in your fingers. 3. Hold this position for __________ seconds. 4. Straighten (extend) your fingers back to the starting position. Repeat this exercise 5-10 times with each hand. Full finger fist 1. Stand or sit with your arm, hand, and all five fingers pointed straight up. Make sure to keep your wrist straight during the exercise. 2. Gently bend your fingers into your palm until the tips of your fingers are touching the middle of your palm. 3. Hold this position for __________ seconds. 4. Extend your fingers back to the starting position, stretching every joint fully. Repeat this exercise 5-10 times with each hand. Straight fist 1. Stand or sit with your arm, hand, and all five fingers pointed straight up. Make sure to keep your wrist straight during the exercise. 2. Gently bend your fingers at the big knuckle, where your fingers meet your hand, and the middle knuckle. Keep the knuckle at the tips of your fingers straight and try to touch the bottom of your palm. 3. Hold this position for __________ seconds. 4. Extend your fingers back to the starting position, stretching every joint fully. Repeat this exercise 5-10 times with each hand. Tabletop 1. Stand or sit with your arm, hand, and all five fingers pointed straight up. Make sure to keep your wrist straight during the exercise. 2. Gently bend your fingers at the  big knuckle, where your fingers meet your hand, as far down as you can while keeping the small knuckles in your fingers straight. Think of forming a tabletop with your fingers. 3. Hold this position for __________ seconds. 4. Extend your fingers back to the starting position, stretching every joint fully. Repeat this exercise 5-10 times with each hand. Finger spread 1. Place your hand flat on a table with your palm facing down. Make sure your wrist stays straight as you do this exercise. 2. Spread your fingers and thumb apart from each other as far as you can until you feel a gentle stretch. Hold this position for __________ seconds. 3. Bring your fingers and thumb tight together again. Hold this position for __________ seconds. Repeat this exercise 5-10 times with each hand. Making circles 1. Stand or sit with your arm, hand, and all five fingers pointed straight up. Make sure to keep your wrist straight during the exercise. 2. Make a circle by touching the tip of your thumb to the tip of your index finger. 3. Hold for __________ seconds. Then open your hand wide. 4. Repeat this motion with your thumb and each finger on your hand. Repeat this exercise 5-10 times with each hand. Thumb motion 1. Sit with your forearm resting on a table and your wrist straight. Your thumb should be facing up toward the ceiling. Keep your fingers relaxed as you move your thumb. 2. Lift your thumb up as high as you can toward the ceiling. Hold for __________ seconds. 3. Bend your thumb across your palm as far as you can, reaching the tip of your thumb for the small finger (pinkie) side of your palm. Hold for __________ seconds. Repeat this exercise 5-10 times with each hand. Grip strengthening 1. Hold a stress ball or other soft ball in the middle of your hand. 2. Slowly increase the pressure, squeezing the ball as much as you can without causing pain. Think of bringing the tips of your fingers into the middle of  your palm. All of your finger joints should bend when doing this exercise. 3. Hold your squeeze for __________ seconds,   then relax. Repeat this exercise 5-10 times with each hand.   Contact a health care provider if:  Your hand pain or discomfort gets much worse when you do an exercise.  Your hand pain or discomfort does not improve within 2 hours after you exercise. If you have any of these problems, stop doing these exercises right away. Do not do them again unless your health care provider says that you can. Get help right away if:  You develop sudden, severe hand pain or swelling. If this happens, stop doing these exercises right away. Do not do them again unless your health care provider says that you can. This information is not intended to replace advice given to you by your health care provider. Make sure you discuss any questions you have with your health care provider. Document Revised: 03/04/2019 Document Reviewed: 11/12/2018 Elsevier Patient Education  2021 [employer]  

## 2021-04-20 ENCOUNTER — Telehealth: Payer: Medicare HMO

## 2021-04-20 ENCOUNTER — Ambulatory Visit (INDEPENDENT_AMBULATORY_CARE_PROVIDER_SITE_OTHER): Payer: Medicare HMO

## 2021-04-20 DIAGNOSIS — E7800 Pure hypercholesterolemia, unspecified: Secondary | ICD-10-CM

## 2021-04-20 DIAGNOSIS — R4182 Altered mental status, unspecified: Secondary | ICD-10-CM

## 2021-04-20 DIAGNOSIS — I10 Essential (primary) hypertension: Secondary | ICD-10-CM

## 2021-04-20 DIAGNOSIS — M25562 Pain in left knee: Secondary | ICD-10-CM

## 2021-04-20 DIAGNOSIS — M79641 Pain in right hand: Secondary | ICD-10-CM

## 2021-04-25 DIAGNOSIS — I10 Essential (primary) hypertension: Secondary | ICD-10-CM | POA: Diagnosis not present

## 2021-05-02 ENCOUNTER — Telehealth: Payer: Self-pay

## 2021-05-02 ENCOUNTER — Telehealth (INDEPENDENT_AMBULATORY_CARE_PROVIDER_SITE_OTHER): Payer: Medicare HMO | Admitting: Nurse Practitioner

## 2021-05-02 ENCOUNTER — Other Ambulatory Visit: Payer: Self-pay | Admitting: Nurse Practitioner

## 2021-05-02 ENCOUNTER — Other Ambulatory Visit: Payer: Self-pay

## 2021-05-02 ENCOUNTER — Encounter: Payer: Self-pay | Admitting: Nurse Practitioner

## 2021-05-02 VITALS — BP 129/69 | HR 64 | Temp 98.2°F

## 2021-05-02 DIAGNOSIS — U071 COVID-19: Secondary | ICD-10-CM

## 2021-05-02 NOTE — Telephone Encounter (Signed)
error 

## 2021-05-02 NOTE — Telephone Encounter (Signed)
The pt agreed to a virtual appt for evaluation of covid symptoms.

## 2021-05-02 NOTE — Patient Instructions (Signed)

## 2021-05-02 NOTE — Progress Notes (Signed)
Virtual Visit via phone    This visit type was conducted due to national recommendations for restrictions regarding the COVID-19 Pandemic (e.g. social distancing) in an effort to limit this patient's exposure and mitigate transmission in our community.  Due to his co-morbid illnesses, this patient is at least at moderate risk for complications without adequate follow up.  This format is felt to be most appropriate for this patient at this time.  All issues noted in this document were discussed and addressed.  A limited physical exam was performed with this format.    This visit type was conducted due to national recommendations for restrictions regarding the COVID-19 Pandemic (e.g. social distancing) in an effort to limit this patient's exposure and mitigate transmission in our community.  Patients identity confirmed using two different identifiers.  This format is felt to be most appropriate for this patient at this time.  All issues noted in this document were discussed and addressed.  No physical exam was performed (except for noted visual exam findings with Video Visits).    Date:  05/02/2021   ID:  Jesus Lam, DOB 10-Mar-1933, MRN [id number]  Patient Location:  Home   Provider location:   Office   Chief Complaint:  Tested positive for covid   History of Present Illness:    Jesus Lam is a 87 y.o. male who presents via video conferencing for a telehealth visit today.    The patient have symptoms concerning for COVID-19 infection (fever, chills, cough, or new shortness of breath).   He is having chills, not sure if he has a fever. Low grade fever. He has covid vaccine. He is coughing up white yellowish phelgn. He is fatigue. Don't feel like doing anything. It started Sunday. He tested positive yesterday. He doesn't SOB or chest pain. He has taken tylenol which helped a little bit.     Past Medical History:  Diagnosis Date  . Anemia   . BPH (benign prostatic hyperplasia)    . Cataracts, bilateral   . CVA (cerebral vascular accident) (HCC)   . DVT (deep venous thrombosis) (HCC)    dvt in left leg  . Dyslipidemia   . Gout   . Gout   . HTN (hypertension)   . Left hip pain 03/15/2020  . Left leg pain   . Osteoarthritis   . PAD (peripheral artery disease) (HCC)   . Stasis dermatitis   . Stroke (HCC)   . Vitamin D deficiency    Past Surgical History:  Procedure Laterality Date  . ABDOMINAL AORTOGRAM N/A 01/13/2018   Procedure: ABDOMINAL AORTOGRAM;  Surgeon: Elder Negus, MD;  Location: [facility];  Service: Cardiovascular;  Laterality: N/A;  . CATARACT EXTRACTION, BILATERAL    . LEFT HEART CATH AND CORONARY ANGIOGRAPHY N/A 01/13/2018   Procedure: LEFT HEART CATH AND CORONARY ANGIOGRAPHY;  Surgeon: Elder Negus, MD;  Location: [facility];  Service: Cardiovascular;  Laterality: N/A;  . LOWER EXTREMITY ANGIOGRAPHY N/A 01/13/2018   Procedure: LOWER EXTREMITY ANGIOGRAPHY;  Surgeon: Elder Negus, MD;  Location: [facility];  Service: Cardiovascular;  Laterality: N/A;  . LOWER EXTREMITY ANGIOGRAPHY Right 01/27/2018   Procedure: LOWER EXTREMITY ANGIOGRAPHY;  Surgeon: Yates Decamp, MD;  Location: [facility];  Service: Cardiovascular;  Laterality: Right;  . PERIPHERAL VASCULAR ATHERECTOMY Left 01/13/2018   Procedure: PERIPHERAL VASCULAR ATHERECTOMY;  Surgeon: Elder Negus, MD;  Location: [facility];  Service: Cardiovascular;  Laterality: Left;  SFA WITH PTA DRUG COATED BALLOON  . PERIPHERAL VASCULAR INTERVENTION  01/27/2018   Procedure: PERIPHERAL VASCULAR INTERVENTION;  Surgeon: Yates Decamp, MD;  Location: [facility];  Service: Cardiovascular;;     No outpatient medications have been marked as taking for the 05/02/21 encounter (Video Visit) with Charlesetta Ivory, NP.     Allergies:   Shellfish allergy   Social History   Tobacco Use  . Smoking status: Never Smoker  . Smokeless tobacco: Never  Used  Vaping Use  . Vaping Use: Never used  Substance Use Topics  . Alcohol use: Yes    Alcohol/week: 6.0 - 7.0 standard drinks    Types: 6 - 7 Shots of liquor per week    Comment: 6-7 drinks per week  . Drug use: Yes    Frequency: 7.0 times per week    Types: Marijuana    Comment: daily ( 02/09/19)     Family Hx: The patient's family history includes Alcohol abuse in his father; Breast cancer in his daughter; CVA in his daughter; Cancer in his father and mother; Stroke in his daughter.  ROS:   Please see the history of present illness.    Review of Systems  Constitutional: Positive for chills, fever and malaise/fatigue.  Respiratory: Negative for cough, shortness of breath and wheezing.   Cardiovascular: Negative for chest pain.  Gastrointestinal: Negative for nausea and vomiting.  Musculoskeletal: Negative for myalgias.  Neurological: Negative for dizziness, weakness and headaches.    All other systems reviewed and are negative.   Labs/Other Tests and Data Reviewed:    Recent Labs: 02/08/2021: ALT 17; BUN 15; Creatinine, Ser 1.39; Potassium 5.1; Sodium 146; TSH 1.110   Recent Lipid Panel Lab Results  Component Value Date/Time   CHOL 124 02/08/2021 03:29 PM   TRIG 203 (H) 02/08/2021 03:29 PM   HDL 32 (L) 02/08/2021 03:29 PM   CHOLHDL 3.9 02/08/2021 03:29 PM   CHOLHDL 4.4 06/13/2018 10:18 AM   LDLCALC 59 02/08/2021 03:29 PM    Wt Readings from Last 3 Encounters:  04/17/21 235 lb (106.6 kg)  02/08/21 240 lb 4.8 oz (109 kg)  02/08/21 240 lb 6.4 oz (109 kg)     Exam:    Vital Signs:  BP 129/69   Pulse 64   Temp 98.2 F (36.8 C) (Oral)     Physical Exam Vitals and nursing note reviewed.  HENT:     Head: Normocephalic and atraumatic.  Pulmonary:     Effort: Pulmonary effort is normal.  Neurological:     Mental Status: He is alert and oriented to person, place, and time.  Psychiatric:        Mood and Affect: Affect normal.     ASSESSMENT & PLAN:      1. COVID Orders have been placed for the patient to receive monoclonal antibody treatment. They will call him to schedule for him to come in to get the treatment.    The patient was instructed to stay well hydrated. The patient was instructed to go to the emergency room if he experiences any shortness of breath or chest pain.  -Advised patient to take Vitamin C, D, Zinc. Keep yourself hydrated with a lot of water and rest. Take Delsym for cough and Mucinex. Take Tylenol or pain reliever every 4-6 hours as needed for pain/fever/body ache. Educated patient if symptoms get worse or if she experiences any SOB or pain in her legs to seek immediate emergency care. Continue to monitor  your pulse oxygen. Call us if you have any questions. Quarantine for 5 days if tested positive and no symptoms or 10 days if tested positive and have symptoms. Wear a mask around other people.   The patient was encouraged to call or send a message through MyChart for any questions or concerns.   Side effects and appropriate use of all the medication(s) were discussed with the patient today. Patient advised to use the medication(s) as directed by their healthcare provider. The patient was encouraged to read, review, and understand all associated package inserts and contact our office with any questions or concerns. The patient accepts the risks of the treatment plan and had an opportunity to ask questions.   Patient was given opportunity to ask questions. Patient verbalized understanding of the plan and was able to repeat key elements of the plan. All questions were answered to their satisfaction.  Raman Imir Brumbach, DNP   I, Raman Shamari Lofquist have reviewed all documentation for this visit. The documentation on 05/02/21 for the exam, diagnosis, procedures, and orders are all accurate and complete.      COVID-19 Education: The signs and symptoms of COVID-19 were discussed with the patient and how to seek care for testing (follow up  with PCP or arrange E-visit).  The importance of social distancing was discussed today.  Patient Risk:   After full review of this patients clinical status, I feel that they are at least moderate risk at this time.  Time:   Today, I have spent 15 minutes/ seconds with the patient with telehealth technology discussing above diagnoses.     Medication Adjustments/Labs and Tests Ordered: Current medicines are reviewed at length with the patient today.  Concerns regarding medicines are outlined above.   Tests Ordered: No orders of the defined types were placed in this encounter.   Medication Changes: No orders of the defined types were placed in this encounter.   Follow up: if symptoms persist or do not get better.   Signed, Charlesetta Ivory, NP

## 2021-05-03 ENCOUNTER — Other Ambulatory Visit: Payer: Self-pay | Admitting: Nurse Practitioner

## 2021-05-03 ENCOUNTER — Telehealth: Payer: Self-pay

## 2021-05-03 DIAGNOSIS — G3184 Mild cognitive impairment, so stated: Secondary | ICD-10-CM

## 2021-05-03 NOTE — Progress Notes (Signed)
Called patient to check to see how he is feeling. The infusion center called him today this afternoon to schedule an appt for him for the monoclonal antibodies infusion however they were not able to get through with him as noted in his chart. I have called him and given him the number to Covid infusion center [facility]. [phone]. He is to call tomorrow at 8 am. I have advised him that if his symptoms get any worse tonight or he feels SOB, chest pain, he is advised to go to the emergency room. Patient verbalized understanding.

## 2021-05-03 NOTE — Telephone Encounter (Signed)
Unable to contact patient to schedule COVID Infusion. "Call cannot be completed as dialed"

## 2021-05-04 ENCOUNTER — Telehealth: Payer: Self-pay

## 2021-05-04 ENCOUNTER — Ambulatory Visit (INDEPENDENT_AMBULATORY_CARE_PROVIDER_SITE_OTHER): Payer: Medicare HMO

## 2021-05-04 DIAGNOSIS — U071 COVID-19: Secondary | ICD-10-CM

## 2021-05-04 MED ORDER — DIPHENHYDRAMINE HCL 50 MG/ML IJ SOLN: 50.0000 mg | Freq: Once | INTRAMUSCULAR | Status: AC | PRN

## 2021-05-04 MED ORDER — BEBTELOVIMAB 175 MG/2 ML IV (EUA)
175.0000 mg | Freq: Once | INTRAMUSCULAR | Status: AC
  Administered 2021-05-04: 175 mg via INTRAVENOUS

## 2021-05-04 MED ORDER — EPINEPHRINE 0.3 MG/0.3ML IJ SOAJ: 0.3000 mg | Freq: Once | INTRAMUSCULAR | Status: AC | PRN

## 2021-05-04 MED ORDER — FAMOTIDINE IN NACL 20-0.9 MG/50ML-% IV SOLN: 20.0000 mg | Freq: Once | INTRAVENOUS | Status: AC | PRN

## 2021-05-04 MED ORDER — ALBUTEROL SULFATE HFA 108 (90 BASE) MCG/ACT IN AERS: 2.0000 | INHALATION_SPRAY | Freq: Once | RESPIRATORY_TRACT | Status: AC | PRN

## 2021-05-04 MED ORDER — METHYLPREDNISOLONE SODIUM SUCC 125 MG IJ SOLR: 125.0000 mg | Freq: Once | INTRAMUSCULAR | Status: AC | PRN

## 2021-05-04 MED ORDER — SODIUM CHLORIDE 0.9 % IV SOLN: INTRAVENOUS | Status: DC | PRN

## 2021-05-04 NOTE — Telephone Encounter (Signed)
The pt's daughter Juliette Alcide said that the pt has been contacted for covid treatment and that his appt is at 1:30.

## 2021-05-04 NOTE — Chronic Care Management (AMB) (Addendum)
Chronic Care Management   CCM RN Visit Note  04/20/2021 Name: Jesus Lam MRN: [id number] DOB: [birth year]  Subjective: Jesus Lam is a 87 y.o. year old male who is a primary care patient of [name], FNP. The care management team was consulted for assistance with disease management and care coordination needs.    Engaged with patient by telephone for follow up visit in response to provider referral for case management and/or care coordination services.   Consent to Services:  The patient was given information about Chronic Care Management services, agreed to services, and gave verbal consent prior to initiation of services.  Please see initial visit note for detailed documentation.   Patient agreed to services and verbal consent obtained.   Assessment: Review of patient past medical history, allergies, medications, health status, including review of consultants reports, laboratory and other test data, was performed as part of comprehensive evaluation and provision of chronic care management services.   SDOH (Social Determinants of Health) assessments and interventions performed:  Yes, no acute challenges  CCM Care Plan  Allergies  Allergen Reactions   Shellfish Allergy Other (See Comments)    Gout     Outpatient Encounter Medications as of 04/20/2021  Medication Sig   amLODipine (NORVASC) 5 MG tablet TAKE ONE TABLET BY MOUTH DAILY   atorvastatin (LIPITOR) 80 MG tablet TAKE 1 TABLET ONE TIME DAILY   bisoprolol (ZEBETA) 5 MG tablet TAKE HALF TABLET BY MOUTH DAILY   chlorthalidone (HYGROTON) 25 MG tablet Take 1 tablet (25 mg total) by mouth daily.   Cholecalciferol (VITAMIN D3) 5000 units CAPS Take 5,000 Units by mouth daily.   clopidogrel (PLAVIX) 75 MG tablet TAKE ONE TABLET BY MOUTH DAILY   diclofenac Sodium (VOLTAREN) 1 % GEL APPLY TWO GRAMS TOPICALLY FOUR TIMES A DAY   Elastic Bandages & Supports (MEDICAL COMPRESSION STOCKINGS) MISC    furosemide (LASIX) 20 MG  tablet TAKE ONE TABLET BY MOUTH DAILY   gabapentin (NEURONTIN) 300 MG capsule TAKE ONE CAPSULE BY MOUTH AT BEDTIME   hydrALAZINE (APRESOLINE) 25 MG tablet TAKE ONE TABLET BY MOUTH TWICE A DAY   icosapent Ethyl (VASCEPA) 1 g capsule TAKE ONE CAPSULE BY MOUTH TWICE A DAY   isosorbide mononitrate (IMDUR) 30 MG 24 hr tablet TAKE ONE TABLET BY MOUTH TWICE A DAY   ketorolac (ACULAR) 0.5 % ophthalmic solution ketorolac 0.5 % eye drops   Menthol-Methyl Salicylate (MUSCLE RUB) 10-15 % CREA muscle rub   methimazole (TAPAZOLE) 10 MG tablet TAKE ONE TABLET BY MOUTH DAILY   Multiple Vitamins-Minerals (COMPLETE SENIOR PO) 1 tablet daily.    tamsulosin (FLOMAX) 0.4 MG CAPS capsule TAKE ONE CAPSULE BY MOUTH DAILY HALF HOUR FOLLOWING THE SAME MEAL DAILY   vitamin B-12 (CYANOCOBALAMIN) 500 MCG tablet Take 500 mcg by mouth daily.   vitamin C (ASCORBIC ACID) 500 MG tablet Take 500 mg daily by mouth.   [DISCONTINUED] donepezil (ARICEPT) 5 MG tablet TAKE 1 TABLET BY MOUTH AT BEDTIME.   No facility-administered encounter medications on file as of 04/20/2021.    Patient Active Problem List   Diagnosis Date Noted   Mild cognitive impairment 09/07/2020   Chronic pain of left knee 03/15/2020   Pure hypercholesterolemia 11/15/2019   First degree AV block 07/28/2019   Hypertriglyceridemia 04/29/2019   Mixed hyperlipidemia 04/29/2019   CVA (cerebral vascular accident) [facility])    Bradycardia    Subclinical hyperthyroidism 11/18/2018   Peripheral artery disease (HCC) 06/13/2018   Ischemic cerebrovascular accident (CVA) (HCC) 06/13/2018  Dysarthria 06/12/2018   Renal insufficiency 06/12/2018   Claudication (HCC) 01/11/2018   Anemia 10/10/2017   Essential hypertension 11/04/2013    Conditions to be addressed/monitored: HTN, Hyperthyroidism, Altered memory,  Bilateral hand pain, Medication management, Pure hypercholesterolemia, Chronic pain of left knee   Care Plan : Hypertriglyceridemia  Updates made by Riley Churches, RN since 04/20/2021 12:00 AM     Problem: Hypertriglyceridemia   Priority: Medium     Long-Range Goal: Hypertriglyceridemia - treatment optimized   Start Date: 02/20/2021  Expected End Date: 02/20/2022  Recent Progress: On track  Priority: Medium  Note:   Current Barriers:  Ineffective Self Health Maintenance Memory deficits  Clinical Goal(s):  Collaboration with [name], FNP regarding development and update of comprehensive plan of care as evidenced by provider attestation and co-signature Inter-disciplinary care team collaboration (see longitudinal plan of care) patient will work with care management team to address care coordination and chronic disease management needs related to Disease Management Educational Needs Care Coordination Medication Management and Education Psychosocial Support   Interventions:  04/20/21 completed successful outbound call with patient  Evaluation of current treatment plan related to  elevated triglycerides , self-management and patient's adherence to plan as established by provider. Collaboration with [name], FNP regarding development and update of comprehensive plan of care as evidenced by provider attestation       and co-signature Inter-disciplinary care team collaboration (see longitudinal plan of care) Provided education to patient about basic disease process related to elevated triglycerides  Determined patient is adhering to dietary and exercise recommendations as directed Review of patient status, including review of consultants reports, relevant laboratory and other test results, and medications completed. Reviewed medications with patient and discussed importance of medication adherence Confirmed patient received and reviewed the printed educational materials related to lowering Cholesterol and Triglycerides  Discussed plans with patient for ongoing care management follow up and provided patient with direct contact  information for care management team Self Care Activities:  Continue to keep all scheduled follow up appointments Take medications as directed  Let your healthcare team know if you are unable to take your medications Call your pharmacy for refills at least 7 days prior to running out of medication Patient Goals: - continue to adhere to dietary and exercise recommendations  - review patient educational materials related to elevated Triglycerides    Follow Up Plan:  Telephone follow up appointment with care management team member scheduled for: 08/13/21    Care Plan : Rheumatoid Arthritis /Osteoarthritis (Adult)  Updates made by Riley Churches, RN since 05/04/2021 12:00 AM     Problem: Mobility and Function (Osteoarthritis)   Priority: High     Long-Range Goal: Maintain Mobility and Function   Start Date: 02/20/2021  Expected End Date: 02/20/2022  Recent Progress: On track  Priority: High  Note:   Current Barriers:  Ineffective Self Health Maintenance Memory deficits  Clinical Goal(s):  Collaboration with [name], FNP regarding development and update of comprehensive plan of care as evidenced by provider attestation and co-signature Inter-disciplinary care team collaboration (see longitudinal plan of care) patient will work with care management team to address care coordination and chronic disease management needs related to Disease Management Educational Needs Care Coordination Medication Management and Education Psychosocial Support   Interventions:  04/20/21 completed successful outbound call with patient  Evaluation of current treatment plan related to Chronic pain of left knee, self-management and patient's adherence to plan as established by provider. Collaboration with Christell Constant,  Lolita Cram, FNP regarding development and update of comprehensive plan of care as evidenced by provider attestation       and co-signature Inter-disciplinary care team collaboration (see  longitudinal plan of care) Provided education to patient about basic Arthritis disease process Review of patient status, including review of consultants reports, relevant laboratory and other test results, and medications completed. Reviewed medications with patient and discussed importance of medication adherence Reviewed and discussed Rheumatology follow up completed on 04/17/21 with Dr. Corliss Skains with the following Assessment/Plan reviewed:   Assessment / Plan:     Visit Diagnoses: Pain in both hands -he complains of pain and discomfort in his bilateral hands.  He has difficulty making a fist.  He also gives history of intermittent swelling.  No synovitis was noted.  DIP and PIP thickening was noted.  Plan: XR Hand 2 View Right, XR Hand 2 View Left x-rays findings were consistent with osteoarthritis of bilateral hands.  A handout on hand exercises was given.   Rheumatoid factor positive - 02/08/21: RF 21.8, CRP 13, dsDNA<1, C3 163, ANA negative, TSH 1.11, T4 7.5, T3 3.0.  He has positive rheumatoid factor but I do not see any synovitis on examination. If he has persistent pain and discomfort I may consider obtaining ultrasound of his bilateral hands to look for synovitis.  I   Chronic pain of left knee -he complains of left knee joint discomfort for many years.  He has been seen by orthopedic surgeon in the past.  He states he was advised knee joint surgery.  No warmth swelling or effusion was noted.  Plan: XR KNEE 3 VIEW LEFT.  X-ray showed end-stage severe osteoarthritis and severe patellofemoral narrowing consistent with chondromalacia patella.  A handout on knee strengthening exercises was given.  He is currently not prepared to have total knee replacement but he has seen an orthopedic surgeon in the past.   Pain in both feet -he complains of discomfort in his bilateral feet.  He states he had left ankle joint fracture in the past.  He walks with right foot external rotation.  Plan: XR Foot 2 Views  Right, XR Foot 2 Views Left.  X-ray showed bilateral osteoarthritis and right ankle showed posttraumatic changes. Orders:    Orders Placed This Encounter  Procedures   XR Hand 2 View Right   XR Hand 2 View Left   XR KNEE 3 VIEW LEFT   XR Foot 2 Views Right   XR Foot 2 Views Left    No orders of the defined types were placed in this encounter. Follow-Up Instructions: Return for Pain in multiplr joints. Mailed printed educational materials related to "Hand Exercises to help ease Arthritis"  Determined patient has a good understanding of his prescribed treatment plan per Dr. Corliss Skains Discussed plans with patient for ongoing care management follow up and provided patient with direct contact information for care management team Self Care Activities:   Continue to keep all scheduled follow up appointments Take medications as directed  Let your healthcare team know if you are unable to take your medications Call your pharmacy for refills at least 7 days prior to running out of medication Patient Goals: -continue to follow up with Rheumatology as directed  -review printed educational material for hand exercises   Follow Up Plan: Telephone follow up appointment with care management team member scheduled for: 08/13/21      Plan:Telephone follow up appointment with care management team member scheduled for:  08/13/21  [name], RN,  BSN, CCM Care Management Coordinator [facility]  Direct Phone: [phone]

## 2021-05-04 NOTE — Patient Instructions (Signed)
10 Things You Can Do to Manage Your COVID-19 Symptoms at Home If you have possible or confirmed COVID-19 Stay home except to get medical care. Monitor your symptoms carefully. If your symptoms get worse, call your healthcare provider immediately. Get rest and stay hydrated. If you have a medical appointment, call the healthcare provider ahead of time and tell them that you have or may have COVID-19. For medical emergencies, call 911 and notify the dispatch personnel that you have or may have COVID-19. Cover your cough and sneezes with a tissue or use the inside of your elbow. Wash your hands often with soap and water for at least 20 seconds or clean your hands with an alcohol-based hand sanitizer that contains at least 60% alcohol. As much as possible, stay in a specific room and away from other people in your home. Also, you should use a separate bathroom, if available. If you need to be around other people in or outside of the home, wear a mask. Avoid sharing personal items with other people in your household, like dishes, towels, and bedding. Clean all surfaces that are touched often, like counters, tabletops, and doorknobs. Use household cleaning sprays or wipes according to the label instructions. cdc.gov/coronavirus 06/09/2020 This information is not intended to replace advice given to you by your health care provider. Make sure you discuss any questions you have with your healthcare provider. Document Revised: 12/29/2020 Document Reviewed: 12/29/2020 Elsevier Patient Education  2022 [employer]  

## 2021-05-04 NOTE — Progress Notes (Signed)
  Diagnosis: COVID  Provider:  Praveen Mannam, MD  Procedure: Infusion  IV Type: Peripheral, IV Location: L Antecubital  Bebtelovimab, Dose: 175 mg  Infusion Start Time: 1350  Infusion Stop Time: 1351  Post Infusion IV Care: Observation period completed and Peripheral IV Discontinued  Discharge: Condition: Good, Destination: Home . AVS provided to patient.   Performed by:  Kayzlee Wirtanen, RN 

## 2021-05-04 NOTE — Patient Instructions (Signed)
Goals Addressed      Effective Long Term Care Planning   On track    Timeframe:  Long-Range Goal Priority:  High Start Date: 02/20/21                             Expected End Date: 02/20/22   Follow up date: 08/13/21  Self Care Activities:  Continue to keep all scheduled follow up appointments Take medications as directed  Let your healthcare team know if you are unable to take your medications Call your pharmacy for refills at least 7 days prior to running out of medication Patient Goals: - maintain or improve memory                             Hypertriglyceridemia - treatment optimized   On track    Timeframe:  Long-Range Goal Priority:  Medium Start Date:  02/20/21                           Expected End Date:  02/20/22      Next Scheduled Follow Up date: 08/13/21  Self Care Activities:  Continue to keep all scheduled follow up appointments Take medications as directed  Let your healthcare team know if you are unable to take your medications Call your pharmacy for refills at least 7 days prior to running out of medication Patient Goals: - lower Triglycerides and increase HDL - review patient educational materials related to elevated Triglycerides                        Maintain Mobility and Function   On track    Timeframe:  Long-Range Goal Priority:  High Start Date:  02/20/21                           Expected End Date: 02/20/22    Follow Up date: 08/13/21  Self Care Activities:   Continue to keep all scheduled follow up appointments Take medications as directed  Let your healthcare team know if you are unable to take your medications Call your pharmacy for refills at least 7 days prior to running out of medication Patient Goals: -continue to follow up with Rheumatology as directed -review educational material for hand exercises

## 2021-05-07 NOTE — Progress Notes (Signed)
Office Visit Note  Patient: Jesus Lam             Date of Birth: 1933/07/26           MRN: [id number]             PCP: [name], FNP Referring: [name], FNP Visit Date: 05/16/2021 Occupation: @  Subjective:  Pain in both hands and knees   History of Present Illness: Jesus Lam is a 87 y.o. male with a history of osteoarthritis.  He continues to have pain and stiffness in his bilateral hands.  He states the knee joints hurt all the time.  He has difficulty walking due to left knee joint discomfort.  He uses a cane.  He states that his hands feel swollen.  Activities of Daily Living:  Patient reports morning stiffness for 15-20 minutes.   Patient Reports nocturnal pain.  Difficulty dressing/grooming: Denies Difficulty climbing stairs: Denies Difficulty getting out of chair: Denies Difficulty using hands for taps, buttons, cutlery, and/or writing: Reports  Review of Systems  Constitutional:  Negative for fatigue.  HENT:  Positive for mouth dryness. Negative for mouth sores and nose dryness.   Eyes:  Negative for pain, itching and dryness.  Respiratory:  Negative for shortness of breath and difficulty breathing.   Cardiovascular:  Negative for chest pain and palpitations.  Gastrointestinal:  Negative for blood in stool, constipation and diarrhea.  Endocrine: Negative for increased urination.  Genitourinary:  Negative for difficulty urinating.  Musculoskeletal:  Positive for joint pain, joint pain, joint swelling and morning stiffness. Negative for myalgias, muscle tenderness and myalgias.  Skin:  Negative for color change, rash and redness.  Allergic/Immunologic: Negative for susceptible to infections.  Neurological:  Positive for numbness and memory loss. Negative for dizziness and headaches.  Hematological:  Negative for bruising/bleeding tendency.  Psychiatric/Behavioral:  Negative for confusion.    PMFS History:  Patient Active Problem List    Diagnosis Date Noted   Mild cognitive impairment 09/07/2020   Chronic pain of left knee 03/15/2020   Pure hypercholesterolemia 11/15/2019   First degree AV block 07/28/2019   Hypertriglyceridemia 04/29/2019   Mixed hyperlipidemia 04/29/2019   CVA (cerebral vascular accident) (HCC)    Bradycardia    Subclinical hyperthyroidism 11/18/2018   Peripheral artery disease (HCC) 06/13/2018   Ischemic cerebrovascular accident (CVA) (HCC) 06/13/2018   Dysarthria 06/12/2018   Renal insufficiency 06/12/2018   Claudication (HCC) 01/11/2018   Anemia 10/10/2017   Essential hypertension 11/04/2013    Past Medical History:  Diagnosis Date   Anemia    BPH (benign prostatic hyperplasia)    Cataracts, bilateral    CVA (cerebral vascular accident) (HCC)    DVT (deep venous thrombosis) (HCC)    dvt in left leg   Dyslipidemia    Gout    Gout    HTN (hypertension)    Left hip pain 03/15/2020   Left leg pain    Osteoarthritis    PAD (peripheral artery disease) (HCC)    Stasis dermatitis    Stroke (HCC)    Vitamin D deficiency     Family History  Problem Relation Age of Onset   Cancer Mother    Cancer Father    Alcohol abuse Father    Breast cancer Daughter    Stroke Daughter    CVA Daughter    Past Surgical History:  Procedure Laterality Date   ABDOMINAL AORTOGRAM N/A 01/13/2018   Procedure: ABDOMINAL AORTOGRAM;  Surgeon: Elder Negus, MD;  Location: [facility];  Service: Cardiovascular;  Laterality: N/A;   CATARACT EXTRACTION, BILATERAL     LEFT HEART CATH AND CORONARY ANGIOGRAPHY N/A 01/13/2018   Procedure: LEFT HEART CATH AND CORONARY ANGIOGRAPHY;  Surgeon: Elder Negus, MD;  Location: [facility];  Service: Cardiovascular;  Laterality: N/A;   LOWER EXTREMITY ANGIOGRAPHY N/A 01/13/2018   Procedure: LOWER EXTREMITY ANGIOGRAPHY;  Surgeon: Elder Negus, MD;  Location: [facility];  Service: Cardiovascular;  Laterality: N/A;   LOWER EXTREMITY  ANGIOGRAPHY Right 01/27/2018   Procedure: LOWER EXTREMITY ANGIOGRAPHY;  Surgeon: Yates Decamp, MD;  Location: [facility];  Service: Cardiovascular;  Laterality: Right;   PERIPHERAL VASCULAR ATHERECTOMY Left 01/13/2018   Procedure: PERIPHERAL VASCULAR ATHERECTOMY;  Surgeon: Elder Negus, MD;  Location: [facility];  Service: Cardiovascular;  Laterality: Left;  SFA WITH PTA DRUG COATED BALLOON   PERIPHERAL VASCULAR INTERVENTION  01/27/2018   Procedure: PERIPHERAL VASCULAR INTERVENTION;  Surgeon: Yates Decamp, MD;  Location: [facility];  Service: Cardiovascular;;   Social History   Social History Narrative   ** Merged History Encounter **       Lives with daughter, [name]. 2 daughters live here in [place]. Son lives in [place]. 3/16- offered information on local food pantries as well as congregate meal sites. Information declined.   Immunization History  Administered Date(s) Administered   Fluad Quad(high Dose 65+) 08/03/2020   Influenza, High Dose Seasonal PF 10/29/2018, 08/11/2019   Moderna SARS-COV2 Booster Vaccination 11/15/2020   PFIZER(Purple Top)SARS-COV-2 Vaccination 01/17/2020, 01/29/2020   Unspecified SARS-COV-2 Vaccination 05/04/2021     Objective: Vital Signs: BP 112/66 (BP Location: Left Arm, Patient Position: Sitting, Cuff Size: Normal)   Pulse 60   Resp 18   Ht 5\' 11"  (1.803 m)   Wt 235 lb (106.6 kg)   BMI 32.78 kg/m    Physical Exam Vitals and nursing note reviewed.  Constitutional:      Appearance: He is well-developed.  HENT:     Head: Normocephalic and atraumatic.  Eyes:     Conjunctiva/sclera: Conjunctivae normal.     Pupils: Pupils are equal, round, and reactive to light.  Cardiovascular:     Rate and Rhythm: Normal rate and regular rhythm.     Heart sounds: Normal heart sounds.  Pulmonary:     Effort: Pulmonary effort is normal.     Breath sounds: Normal breath sounds.  Abdominal:     General: Bowel sounds are normal.      Palpations: Abdomen is soft.  Musculoskeletal:     Cervical back: Normal range of motion and neck supple.  Skin:    General: Skin is warm and dry.     Capillary Refill: Capillary refill takes less than 2 seconds.  Neurological:     Mental Status: He is alert and oriented to person, place, and time.  Psychiatric:        Behavior: Behavior normal.     Musculoskeletal Exam: C-spine was in good range of motion.  Shoulder joints and elbow joints with good range of motion.  He had bilateral PIP and DIP thickening with no synovitis.  Hip joints in good range of motion.  He had varus deformity in his left knee joint with decreased extension.  No warmth swelling or effusion was noted.  There was some crepitus in bilateral knee joints.  There is no tenderness over ankles or MTPs.  CDAI Exam: CDAI Score: -- Patient Global: --; Provider  Global: -- Swollen: --; Tender: -- Joint Exam 05/16/2021   No joint exam has been documented for this visit   There is currently no information documented on the homunculus. Go to the Rheumatology activity and complete the homunculus joint exam.  Investigation: No additional findings.  Imaging: XR Foot 2 Views Left  Result Date: 04/17/2021 First MTP, PIP and DIP narrowing was noted.  Dorsal spurring was noted.  No tibiotalar narrowing was noted.  No erosive changes were noted. Impression: These findings are consistent with osteoarthritis of the foot.  XR Foot 2 Views Right  Result Date: 04/17/2021 First MTP, PIP and DIP narrowing was noted.  Dorsal spurring was noted.  No tibiotalar narrowing was noted.  Subtalar narrowing was noted.  Inferior calcaneal spur was noted. Impression: These findings are consistent with osteoarthritis of the foot.  Subtalar narrowing could be related to previous injury.  XR Hand 2 View Left  Result Date: 04/17/2021 CMC, PIP and DIP narrowing was noted.  No MCP, intercarpal or radiocarpal joint space narrowing was noted.  No  erosive changes were noted. Impression: These findings are consistent with osteoarthritis of the hand.  XR Hand 2 View Right  Result Date: 04/17/2021 CMC, PIP and DIP narrowing was noted.  No MCP, intercarpal or radiocarpal joint space narrowing was noted.  No erosive changes were noted. Impression: These findings are consistent with osteoarthritis of the hand.  XR KNEE 3 VIEW LEFT  Result Date: 04/17/2021 Severe medial compartment narrowing was noted.  Intercondylar osteophytes and medial osteophytes were noted.  Severe patellofemoral narrowing was noted.  Calcification of the blood vessels was noted. Impression: These findings are consistent with end-stage osteoarthritis and severe chondromalacia patella.   Recent Labs: Lab Results  Component Value Date   WBC 6.2 01/28/2019   HGB 11.7 (L) 01/28/2019   PLT 194 01/28/2019   NA 146 (H) 02/08/2021   K 5.1 02/08/2021   CL 108 (H) 02/08/2021   CO2 23 02/08/2021   GLUCOSE 90 02/08/2021   BUN 15 02/08/2021   CREATININE 1.39 (H) 02/08/2021   BILITOT 0.5 02/08/2021   ALKPHOS 93 02/08/2021   AST 21 02/08/2021   ALT 17 02/08/2021   PROT 7.4 02/08/2021   ALBUMIN 4.0 02/08/2021   CALCIUM 9.5 02/08/2021   GFRAA 64 08/03/2020    Speciality Comments: No specialty comments available.  Procedures:  No procedures performed Allergies: Shellfish allergy   Assessment / Plan:     Visit Diagnoses: Primary osteoarthritis of both hands - Clinical and radiographic findings are consistent with osteoarthritis.  Joint protection muscle strengthening was discussed.  A handout on hand exercises was given.  I advised him to contact me if develops any increased swelling.  Rheumatoid factor positive - Low titer rheumatoid factor was negative.  He had no synovitis on examination.  We discussed obtaining ultrasound in case he develops swelling.  He had no synovitis on my examination.  Primary osteoarthritis of left knee - End-stage severe osteoarthritis and  severe patellofemoral narrowing was noted on the x-ray.  He was given a handout on exercises.  We will need total knee replacement.  Patient states he has an orthopedic surgeon and he will schedule appointment.  Have given her a handout on lower extremity muscle strengthening exercises.  Primary osteoarthritis of both feet-proper fitting shoes were discussed.  Other medical problems are listed as follows:  Essential hypertension  First degree AV block  Ischemic cerebrovascular accident (CVA) (HCC)  Pure hypercholesterolemia  Peripheral artery disease (  HCC)  Claudication (HCC)  Renal insufficiency  Subclinical hyperthyroidism  Orders: No orders of the defined types were placed in this encounter.  No orders of the defined types were placed in this encounter.   Follow-Up Instructions: Return if symptoms worsen or fail to improve, for Osteoarthritis.   Pollyann Savoy, MD  Note - This record has been created using Animal nutritionist.  Chart creation errors have been sought, but may not always  have been located. Such creation errors do not reflect on  the standard of medical care.

## 2021-05-07 NOTE — Telephone Encounter (Signed)
Directions for the COVID Infusion Appointment Come to [address] - you will see [facility] on Left and [facility] on Right - Drive between these 2 buildings past the first parking entrance on the left.  Come to the second parking entrance on the left (there will be a sign stating COVID19 Infusion Parking).  Park in this lot and call us at [phone] and someone will come out and bring you inside.        COVID Cost Codes  Bebtelovimab: Medication $0 [id number] Administration $1050 [id number]

## 2021-05-08 NOTE — Progress Notes (Signed)
  Diagnosis: COVID  Provider:  Chilton Greathouse, MD  Procedure: Infusion  IV Type: Peripheral, IV Location: L Antecubital  Bebtelovimab, Dose: 175 mg  Infusion Start Time: 1350  Infusion Stop Time: 1351  Post Infusion IV Care: Observation period completed and Peripheral IV Discontinued  Discharge: Condition: Good, Destination: Home . AVS provided to patient.   Performed by:  Nat Math, RN

## 2021-05-09 ENCOUNTER — Ambulatory Visit (INDEPENDENT_AMBULATORY_CARE_PROVIDER_SITE_OTHER): Payer: Medicare HMO

## 2021-05-09 ENCOUNTER — Telehealth: Payer: Medicare HMO

## 2021-05-09 DIAGNOSIS — I10 Essential (primary) hypertension: Secondary | ICD-10-CM

## 2021-05-09 DIAGNOSIS — G8929 Other chronic pain: Secondary | ICD-10-CM

## 2021-05-09 DIAGNOSIS — M79641 Pain in right hand: Secondary | ICD-10-CM

## 2021-05-09 DIAGNOSIS — E7800 Pure hypercholesterolemia, unspecified: Secondary | ICD-10-CM

## 2021-05-09 DIAGNOSIS — R4182 Altered mental status, unspecified: Secondary | ICD-10-CM

## 2021-05-09 DIAGNOSIS — E0590 Thyrotoxicosis, unspecified without thyrotoxic crisis or storm: Secondary | ICD-10-CM

## 2021-05-09 NOTE — Patient Instructions (Signed)
Goals Addressed      COVID 19 infection - complications minimized or prevented   On track    Timeframe:  Short-Term Goal Priority:  High Start Date:  05/09/21                           Expected End Date: 06/22/21  Next Scheduled Follow up date: 06/20/21        Self Care Activities:  Self administers medications as prescribed Attends all scheduled provider appointments Calls pharmacy for medication refills Calls provider office for new concerns or questions Patient Goals:  follow MD recommendations for post COVID 19

## 2021-05-09 NOTE — Chronic Care Management (AMB) (Signed)
Chronic Care Management   CCM RN Visit Note  05/09/2021 Name: Jesus Lam MRN: [id number] DOB: 1933-08-17  Subjective: Jesus Lam is a 87 y.o. year old male who is a primary care patient of [name], FNP. The care management team was consulted for assistance with disease management and care coordination needs.    Engaged with patient by telephone for follow up visit in response to provider referral for case management and/or care coordination services.   Consent to Services:  The patient was given information about Chronic Care Management services, agreed to services, and gave verbal consent prior to initiation of services.  Please see initial visit note for detailed documentation.   Patient agreed to services and verbal consent obtained.   Assessment: Review of patient past medical history, allergies, medications, health status, including review of consultants reports, laboratory and other test data, was performed as part of comprehensive evaluation and provision of chronic care management services.   SDOH (Social Determinants of Health) assessments and interventions performed:    CCM Care Plan  Allergies  Allergen Reactions   Shellfish Allergy Other (See Comments)    Gout     Outpatient Encounter Medications as of 05/09/2021  Medication Sig   amLODipine (NORVASC) 5 MG tablet TAKE ONE TABLET BY MOUTH DAILY   atorvastatin (LIPITOR) 80 MG tablet TAKE 1 TABLET ONE TIME DAILY   bisoprolol (ZEBETA) 5 MG tablet TAKE HALF TABLET BY MOUTH DAILY   chlorthalidone (HYGROTON) 25 MG tablet Take 1 tablet (25 mg total) by mouth daily.   Cholecalciferol (VITAMIN D3) 5000 units CAPS Take 5,000 Units by mouth daily.   clopidogrel (PLAVIX) 75 MG tablet TAKE ONE TABLET BY MOUTH DAILY   diclofenac Sodium (VOLTAREN) 1 % GEL APPLY TWO GRAMS TOPICALLY FOUR TIMES A DAY   donepezil (ARICEPT) 5 MG tablet TAKE ONE TABLET BY MOUTH AT BEDTIME   Elastic Bandages & Supports (MEDICAL COMPRESSION  STOCKINGS) MISC    furosemide (LASIX) 20 MG tablet TAKE ONE TABLET BY MOUTH DAILY   gabapentin (NEURONTIN) 300 MG capsule TAKE ONE CAPSULE BY MOUTH AT BEDTIME   hydrALAZINE (APRESOLINE) 25 MG tablet TAKE ONE TABLET BY MOUTH TWICE A DAY   icosapent Ethyl (VASCEPA) 1 g capsule TAKE ONE CAPSULE BY MOUTH TWICE A DAY   isosorbide mononitrate (IMDUR) 30 MG 24 hr tablet TAKE ONE TABLET BY MOUTH TWICE A DAY   ketorolac (ACULAR) 0.5 % ophthalmic solution ketorolac 0.5 % eye drops   Menthol-Methyl Salicylate (MUSCLE RUB) 10-15 % CREA muscle rub   methimazole (TAPAZOLE) 10 MG tablet TAKE ONE TABLET BY MOUTH DAILY   Multiple Vitamins-Minerals (COMPLETE SENIOR PO) 1 tablet daily.    tamsulosin (FLOMAX) 0.4 MG CAPS capsule TAKE ONE CAPSULE BY MOUTH DAILY HALF HOUR FOLLOWING THE SAME MEAL DAILY   vitamin B-12 (CYANOCOBALAMIN) 500 MCG tablet Take 500 mcg by mouth daily.   vitamin C (ASCORBIC ACID) 500 MG tablet Take 500 mg daily by mouth.   Facility-Administered Encounter Medications as of 05/09/2021  Medication   0.9 %  sodium chloride infusion    Patient Active Problem List   Diagnosis Date Noted   Mild cognitive impairment 09/07/2020   Chronic pain of left knee 03/15/2020   Pure hypercholesterolemia 11/15/2019   First degree AV block 07/28/2019   Hypertriglyceridemia 04/29/2019   Mixed hyperlipidemia 04/29/2019   CVA (cerebral vascular accident) [facility])    Bradycardia    Subclinical hyperthyroidism 11/18/2018   Peripheral artery disease (HCC) 06/13/2018   Ischemic cerebrovascular accident (  CVA) (HCC) 06/13/2018   Dysarthria 06/12/2018   Renal insufficiency 06/12/2018   Claudication (HCC) 01/11/2018   Anemia 10/10/2017   Essential hypertension 11/04/2013    Conditions to be addressed/monitored: HTN, Hyperthyroidism, Altered memory,  Bilateral hand pain, Medication management, Pure hypercholesterolemia, Chronic pain of left knee   Care Plan : COVID 19 infection  Updates made by Riley Churches, RN since 05/09/2021 12:00 AM     Problem: COVID 19 infection   Priority: High     Goal: COVID 19 infection - complications minimized or prevented   Start Date: 05/09/2021  Expected End Date: 06/22/2021  This Visit's Progress: On track  Priority: High  Note:   Current Barriers:  Ineffective Self Health Maintenance  Clinical Goal(s):  Collaboration with [name], FNP regarding development and update of comprehensive plan of care as evidenced by provider attestation and co-signature Inter-disciplinary care team collaboration (see longitudinal plan of care) patient will work with care management team to address care coordination and chronic disease management needs related to Disease Management Educational Needs Care Coordination Medication Management and Education Psychosocial Support   Interventions:  05/09/21 completed successful outbound call with patient  Evaluation of current treatment plan related to  COVID 19 infection  , self-management and patient's adherence to plan as established by provider. Collaboration with [name], FNP regarding development and update of comprehensive plan of care as evidenced by provider attestation       and co-signature Inter-disciplinary care team collaboration (see longitudinal plan of care) Determined patient tested positive for COVID 19 along with other close family members  Determined he received the monoclonial antibody infusion and feels he is making a full recovery at this time Instructed patient to continue to balance his activity with rest and to stay well hydrated, report any new or worsening symptoms to PCP promptly  Discussed plans with patient for ongoing care management follow up and provided patient with direct contact information for care management team Self Care Activities:  Self administers medications as prescribed Attends all scheduled provider appointments Calls pharmacy for medication refills Calls provider office  for new concerns or questions Patient Goals:  follow MD recommendations for post COVID 19  Follow Up Plan: Telephone follow up appointment with care management team member scheduled for: 06/20/21     Plan:Telephone follow up appointment with care management team member scheduled for:  06/20/21  [name], RN, BSN, CCM Care Management Coordinator [facility]  Direct Phone: [phone]

## 2021-05-15 ENCOUNTER — Encounter: Payer: Self-pay | Admitting: Nurse Practitioner

## 2021-05-15 ENCOUNTER — Other Ambulatory Visit: Payer: Self-pay

## 2021-05-15 ENCOUNTER — Ambulatory Visit (INDEPENDENT_AMBULATORY_CARE_PROVIDER_SITE_OTHER): Payer: Medicare HMO | Admitting: Nurse Practitioner

## 2021-05-15 VITALS — BP 128/70 | HR 62 | Temp 98.4°F | Ht 71.0 in | Wt 232.0 lb

## 2021-05-15 DIAGNOSIS — Z6832 Body mass index (BMI) 32.0-32.9, adult: Secondary | ICD-10-CM | POA: Diagnosis not present

## 2021-05-15 DIAGNOSIS — E669 Obesity, unspecified: Secondary | ICD-10-CM

## 2021-05-15 DIAGNOSIS — Z23 Encounter for immunization: Secondary | ICD-10-CM | POA: Diagnosis not present

## 2021-05-15 DIAGNOSIS — G3184 Mild cognitive impairment, so stated: Secondary | ICD-10-CM

## 2021-05-15 DIAGNOSIS — J310 Chronic rhinitis: Secondary | ICD-10-CM

## 2021-05-15 DIAGNOSIS — I10 Essential (primary) hypertension: Secondary | ICD-10-CM | POA: Diagnosis not present

## 2021-05-15 DIAGNOSIS — Z9989 Dependence on other enabling machines and devices: Secondary | ICD-10-CM

## 2021-05-15 MED ORDER — LEVOCETIRIZINE DIHYDROCHLORIDE 5 MG PO TABS: 5.0000 mg | ORAL_TABLET | Freq: Every evening | ORAL | 1 refills | Status: DC

## 2021-05-15 MED ORDER — DONEPEZIL HCL 10 MG PO TABS: 10.0000 mg | ORAL_TABLET | Freq: Every day | ORAL | 1 refills | Status: DC

## 2021-05-15 MED ORDER — SHINGRIX 50 MCG/0.5ML IM SUSR: 0.5000 mL | Freq: Once | INTRAMUSCULAR | 0 refills | Status: AC

## 2021-05-15 MED ORDER — PREVNAR 20 0.5 ML IM SUSY: 0.5000 mL | PREFILLED_SYRINGE | INTRAMUSCULAR | 0 refills | Status: AC

## 2021-05-15 MED ORDER — TETANUS-DIPHTH-ACELL PERTUSSIS 5-2.5-18.5 LF-MCG/0.5 IM SUSP: 0.5000 mL | Freq: Once | INTRAMUSCULAR | 0 refills | Status: AC

## 2021-05-15 NOTE — Patient Instructions (Signed)
Zoster Vaccine, Recombinant injection What is this medication? ZOSTER VACCINE (ZOS ter vak SEEN) is a vaccine used to reduce the risk of getting shingles. This vaccine is not used to treat shingles or nerve pain fromshingles. This medicine may be used for other purposes; ask your health care provider orpharmacist if you have questions. COMMON BRAND NAME(S): SHINGRIX What should I tell my care team before I take this medication? They need to know if you have any of these conditions: cancer immune system problems an unusual or allergic reaction to Zoster vaccine, other medications, foods, dyes, or preservatives pregnant or trying to get pregnant breast-feeding How should I use this medication? This vaccine is injected into a muscle. It is given by a health care provider. A copy of Vaccine Information Statements will be given before each vaccination. Be sure to read this information carefully each time. This sheet may changeoften. Talk to your health care provider about the use of this vaccine in children.This vaccine is not approved for use in children. Overdosage: If you think you have taken too much of this medicine contact apoison control center or emergency room at once. NOTE: This medicine is only for you. Do not share this medicine with others. What if I miss a dose? Keep appointments for follow-up (booster) doses. It is important not to miss your dose. Call your health care provider if you are unable to keep anappointment. What may interact with this medication? medicines that suppress your immune system medicines to treat cancer steroid medicines like prednisone or cortisone This list may not describe all possible interactions. Give your health care provider a list of all the medicines, herbs, non-prescription drugs, or dietary supplements you use. Also tell them if you smoke, drink alcohol, or use illegaldrugs. Some items may interact with your medicine. What should I watch for while  using this medication? Visit your health care provider regularly. This vaccine, like all vaccines, may not fully protect everyone. What side effects may I notice from receiving this medication? Side effects that you should report to your doctor or health care professionalas soon as possible: allergic reactions (skin rash, itching or hives; swelling of the face, lips, or tongue) trouble breathing Side effects that usually do not require medical attention (report these toyour doctor or health care professional if they continue or are bothersome): chills headache fever nausea pain, redness, or irritation at site where injected tiredness vomiting This list may not describe all possible side effects. Call your doctor for medical advice about side effects. You may report side effects to FDA at1-800-FDA-1088. Where should I keep my medication? This vaccine is only given by a health care provider. It will not be stored athome. NOTE: This sheet is a summary. It may not cover all possible information. If you have questions about this medicine, talk to your doctor, pharmacist, orhealth care provider.  2022 Elsevier/Gold Standard (2019-12-17 16:23:07) Tdap (Tetanus, Diphtheria, Pertussis) Vaccine: What You Need to Know 1. Why get vaccinated? Tdap vaccine can prevent tetanus, diphtheria, and pertussis. Diphtheria and pertussis spread from person to person. Tetanus enters the body through cuts or wounds. TETANUS (T) causes painful stiffening of the muscles. Tetanus can lead to serious health problems, including being unable to open the mouth, having trouble swallowing and breathing, or death. DIPHTHERIA (D) can lead to difficulty breathing, heart failure, paralysis, or death. PERTUSSIS (aP), also known as "whooping cough," can cause uncontrollable, violent coughing that makes it hard to breathe, eat, or drink. Pertussis can be extremely   serious especially in babies and young children, causing pneumonia,  convulsions, brain damage, or death. In teens and adults, it can cause weight loss, loss of bladder control, passing out, and rib fractures from severe coughing. 2. Tdap vaccine Tdap is only for children 7 years and older, adolescents, and adults.  Adolescents should receive a single dose of Tdap, preferably at age 11 or 12 years. Pregnant people should get a dose of Tdap during every pregnancy, preferably during the early part of the third trimester, to help protect the newborn from pertussis. Infants are most at risk for severe, life-threatening complications frompertussis. Adults who have never received Tdap should get a dose of Tdap. Also, adults should receive a booster dose of either Tdap or Td (a different vaccine that protects against tetanus and diphtheria but not pertussis) every 10 years, or after 5 years in the case of a severe or dirty wound or burn. Tdap may be given at the same time as other vaccines. 3. Talk with your health care provider Tell your vaccine provider if the person getting the vaccine: Has had an allergic reaction after a previous dose of any vaccine that protects against tetanus, diphtheria, or pertussis, or has any severe, life-threatening allergies Has had a coma, decreased level of consciousness, or prolonged seizures within 7 days after a previous dose of any pertussis vaccine (DTP, DTaP, or Tdap) Has seizures or another nervous system problem Has ever had Guillain-Barr Syndrome (also called "GBS") Has had severe pain or swelling after a previous dose of any vaccine that protects against tetanus or diphtheria In some cases, your health care provider may decide to postpone Tdapvaccination until a future visit. People with minor illnesses, such as a cold, may be vaccinated. People who are moderately or severely ill should usually wait until they recover beforegetting Tdap vaccine.  Your health care provider can give you more information. 4. Risks of a vaccine  reaction Pain, redness, or swelling where the shot was given, mild fever, headache, feeling tired, and nausea, vomiting, diarrhea, or stomachache sometimes happen after Tdap vaccination. People sometimes faint after medical procedures, including vaccination. Tellyour provider if you feel dizzy or have vision changes or ringing in the ears.  As with any medicine, there is a very remote chance of a vaccine causing asevere allergic reaction, other serious injury, or death. 5. What if there is a serious problem? An allergic reaction could occur after the vaccinated person leaves the clinic. If you see signs of a severe allergic reaction (hives, swelling of the face and throat, difficulty breathing, a fast heartbeat, dizziness, or weakness), call [phone] get the person to the nearest hospital. For other signs that concern you, call your health care provider.  Adverse reactions should be reported to the Vaccine Adverse Event Reporting System (VAERS). Your health care provider will usually file this report, or you can do it yourself. Visit the VAERS website at www.vaers.hhs.gov or call 1-800-822-7967. VAERS is only for reporting reactions, and VAERS staff members do not give medical advice. 6. The National Vaccine Injury Compensation Program The National Vaccine Injury Compensation Program (VICP) is a federal program that was created to compensate people who may have been injured by certain vaccines. Claims regarding alleged injury or death due to vaccination have a time limit for filing, which may be as short as two years. Visit the VICP website at www.hrsa.gov/vaccinecompensation or call 1-800-338-2382to learn about the program and about filing a claim. 7. How can I learn more? Ask your   health care provider. Call your local or state health department. Visit the website of the Food and Drug Administration (FDA) for vaccine package inserts and additional information at  www.fda.gov/vaccines-blood-biologics/vaccines. Contact the Centers for Disease Control and Prevention (CDC): Call 1-800-232-4636 (1-800-CDC-INFO) or Visit CDC's website at www.cdc.gov/vaccines. Vaccine Information Statement Tdap (Tetanus, Diphtheria, Pertussis) Vaccine(06/30/2020) This information is not intended to replace advice given to you by your health care provider. Make sure you discuss any questions you have with your healthcare provider. Document Revised: 07/26/2020 Document Reviewed: 07/26/2020 Elsevier Patient Education  2022 Elsevier Inc.  

## 2021-05-15 NOTE — Progress Notes (Signed)
I,Cyerra Yim,acting as a Neurosurgeon for [name], FNP.,have documented all relevant documentation on the behalf of [name], FNP,as directed by  [name], FNP while in the presence of [name], FNP.  This visit occurred during the SARS-CoV-2 public health emergency.  Safety protocols were in place, including screening questions prior to the visit, additional usage of staff PPE, and extensive cleaning of exam room while observing appropriate contact time as indicated for disinfecting solutions.  Subjective:     Patient ID: Jesus Lam , male    DOB: [birth year] , 87 y.o.   MRN: [id number]   Chief Complaint  Patient presents with   Memory Loss    HPI  Pt presents today for a memory follow up, earlier today he did have an issue remembering where his local [employer] was located. He said he sat and thought for a little, then eventually he remembered.  He has his medications in a pill pack.   Wt Readings from Last 3 Encounters: 05/15/21 : 232 lb (105.2 kg) 04/17/21 : 235 lb (106.6 kg) 02/08/21 : 240 lb 4.8 oz (109 kg)      Past Medical History:  Diagnosis Date   Anemia    BPH (benign prostatic hyperplasia)    Cataracts, bilateral    CVA (cerebral vascular accident) (HCC)    DVT (deep venous thrombosis) (HCC)    dvt in left leg   Dyslipidemia    Gout    Gout    HTN (hypertension)    Left hip pain 03/15/2020   Left leg pain    Osteoarthritis    PAD (peripheral artery disease) (HCC)    Stasis dermatitis    Stroke (HCC)    Vitamin D deficiency      Family History  Problem Relation Age of Onset   Cancer Mother    Cancer Father    Alcohol abuse Father    Breast cancer Daughter    Stroke Daughter    CVA Daughter      Current Outpatient Medications:    amLODipine (NORVASC) 5 MG tablet, TAKE ONE TABLET BY MOUTH DAILY, Disp: 90 tablet, Rfl: 2   atorvastatin (LIPITOR) 80 MG tablet, TAKE 1 TABLET ONE TIME DAILY, Disp: 5 tablet, Rfl: 0   bisoprolol (ZEBETA) 5 MG  tablet, TAKE HALF TABLET BY MOUTH DAILY, Disp: 60 tablet, Rfl: 2   chlorthalidone (HYGROTON) 25 MG tablet, Take 1 tablet (25 mg total) by mouth daily., Disp: 5 tablet, Rfl: 0   Cholecalciferol (VITAMIN D3) 5000 units CAPS, Take 5,000 Units by mouth daily., Disp: , Rfl:    clopidogrel (PLAVIX) 75 MG tablet, TAKE ONE TABLET BY MOUTH DAILY, Disp: 90 tablet, Rfl: 3   diclofenac Sodium (VOLTAREN) 1 % GEL, APPLY TWO GRAMS TOPICALLY FOUR TIMES A DAY, Disp: 100 g, Rfl: 0   Elastic Bandages & Supports (MEDICAL COMPRESSION STOCKINGS) MISC, , Disp: , Rfl:    furosemide (LASIX) 20 MG tablet, TAKE ONE TABLET BY MOUTH DAILY, Disp: 90 tablet, Rfl: 1   gabapentin (NEURONTIN) 300 MG capsule, TAKE ONE CAPSULE BY MOUTH AT BEDTIME, Disp: 90 capsule, Rfl: 0   hydrALAZINE (APRESOLINE) 25 MG tablet, TAKE ONE TABLET BY MOUTH TWICE A DAY, Disp: 60 tablet, Rfl: 2   icosapent Ethyl (VASCEPA) 1 g capsule, TAKE ONE CAPSULE BY MOUTH TWICE A DAY, Disp: 60 capsule, Rfl: 0   isosorbide mononitrate (IMDUR) 30 MG 24 hr tablet, TAKE ONE TABLET BY MOUTH TWICE A DAY, Disp: 60 tablet, Rfl: 2  ketorolac (ACULAR) 0.5 % ophthalmic solution, ketorolac 0.5 % eye drops, Disp: , Rfl:    levocetirizine (XYZAL) 5 MG tablet, Take 1 tablet (5 mg total) by mouth every evening., Disp: 90 tablet, Rfl: 1   Menthol-Methyl Salicylate (MUSCLE RUB) 10-15 % CREA, muscle rub, Disp: , Rfl:    methimazole (TAPAZOLE) 10 MG tablet, TAKE ONE TABLET BY MOUTH DAILY, Disp: 30 tablet, Rfl: 0   Multiple Vitamins-Minerals (COMPLETE SENIOR PO), 1 tablet daily. , Disp: , Rfl:    tamsulosin (FLOMAX) 0.4 MG CAPS capsule, TAKE ONE CAPSULE BY MOUTH DAILY HALF HOUR FOLLOWING THE SAME MEAL DAILY, Disp: 90 capsule, Rfl: 0   Tdap (BOOSTRIX) 5-2.5-18.5 LF-MCG/0.5 injection, Inject 0.5 mLs into the muscle once for 1 dose., Disp: 0.5 mL, Rfl: 0   vitamin B-12 (CYANOCOBALAMIN) 500 MCG tablet, Take 500 mcg by mouth daily., Disp: , Rfl:    vitamin C (ASCORBIC ACID) 500 MG tablet,  Take 500 mg daily by mouth., Disp: , Rfl:    Zoster Vaccine Adjuvanted (SHINGRIX) injection, Inject 0.5 mLs into the muscle once for 1 dose., Disp: 0.5 mL, Rfl: 0   donepezil (ARICEPT) 10 MG tablet, Take 1 tablet (10 mg total) by mouth at bedtime., Disp: 90 tablet, Rfl: 1  Current Facility-Administered Medications:    0.9 %  sodium chloride infusion, , Intravenous, PRN, Ghumman, Ramandeep, NP   Allergies  Allergen Reactions   Shellfish Allergy Other (See Comments)    Gout      Review of Systems  Constitutional: Negative.   HENT: Negative.    Eyes: Negative.   Respiratory: Negative.    Cardiovascular: Negative.   Gastrointestinal: Negative.   Endocrine: Negative.   Genitourinary: Negative.   Musculoskeletal: Negative.   Skin: Negative.   Allergic/Immunologic: Negative.   Neurological: Negative.   Hematological: Negative.   Psychiatric/Behavioral:  Positive for confusion (he had forgot where his local [employer]).     Today's Vitals   05/15/21 1408  BP: 128/70  Pulse: 62  Temp: 98.4 F (36.9 C)  SpO2: 97%  Weight: 232 lb (105.2 kg)  Height: 5\' 11"  (1.803 m)   Body mass index is 32.36 kg/m.  Wt Readings from Last 3 Encounters:  05/15/21 232 lb (105.2 kg)  04/17/21 235 lb (106.6 kg)  02/08/21 240 lb 4.8 oz (109 kg)    Objective:  Physical Exam Vitals reviewed.  Constitutional:      General: He is not in acute distress.    Appearance: Normal appearance. He is obese.  Cardiovascular:     Rate and Rhythm: Normal rate and regular rhythm.     Pulses: Normal pulses.     Heart sounds: Normal heart sounds. No murmur heard. Pulmonary:     Effort: Pulmonary effort is normal. No respiratory distress.     Breath sounds: Normal breath sounds. No wheezing.  Musculoskeletal:     Comments: Uses cane for ambulation  Skin:    General: Skin is warm.     Capillary Refill: Capillary refill takes less than 2 seconds.  Neurological:     General: No focal deficit present.      Mental Status: He is alert and oriented to person, place, and time.     Cranial Nerves: No cranial nerve deficit.     Motor: No weakness.  Psychiatric:        Mood and Affect: Mood normal.        Behavior: Behavior normal.        Thought Content: Thought content  normal.        Judgment: Judgment normal.        Assessment And Plan:      1. Essential hypertension Chronic, excellent control  2. BMI 32.0-32.9,adult Chronic, continue with swimming for exercise  3. Mild cognitive impairment I did increase his donepezil to 10 mg daily at bedtime due to having more periods of confusion - donepezil (ARICEPT) 10 MG tablet; Take 1 tablet (10 mg total) by mouth at bedtime.  Dispense: 90 tablet; Refill: 1  4. Chronic rhinitis He is encouraged to use his nasal spray daily  - levocetirizine (XYZAL) 5 MG tablet; Take 1 tablet (5 mg total) by mouth every evening.  Dispense: 90 tablet; Refill: 1  5. Encounter for immunization - Tdap (BOOSTRIX) 5-2.5-18.5 LF-MCG/0.5 injection; Inject 0.5 mLs into the muscle once for 1 dose.  Dispense: 0.5 mL; Refill: 0 - Zoster Vaccine Adjuvanted [facility]) injection; Inject 0.5 mLs into the muscle once for 1 dose.  Dispense: 0.5 mL; Refill: 0 - Pneumococcal conjugate vaccine 20-valent (Prevnar 20)     Patient was given opportunity to ask questions. Patient verbalized understanding of the plan and was able to repeat key elements of the plan. All questions were answered to their satisfaction.  [name], FNP   I, [name], FNP, have reviewed all documentation for this visit. The documentation on 05/15/21 for the exam, diagnosis, procedures, and orders are all accurate and complete.   IF YOU HAVE BEEN REFERRED TO A SPECIALIST, IT MAY TAKE 1-2 WEEKS TO SCHEDULE/PROCESS THE REFERRAL. IF YOU HAVE NOT HEARD FROM US/SPECIALIST IN TWO WEEKS, PLEASE GIVE [place] A CALL AT [phone].   THE PATIENT IS ENCOURAGED TO PRACTICE SOCIAL DISTANCING DUE TO THE  COVID-19 PANDEMIC.

## 2021-05-16 ENCOUNTER — Ambulatory Visit (INDEPENDENT_AMBULATORY_CARE_PROVIDER_SITE_OTHER): Payer: Medicare HMO | Admitting: Rheumatology

## 2021-05-16 ENCOUNTER — Encounter: Payer: Self-pay | Admitting: Rheumatology

## 2021-05-16 VITALS — BP 112/66 | HR 60 | Resp 18 | Ht 71.0 in | Wt 235.0 lb

## 2021-05-16 DIAGNOSIS — R7689 Other specified abnormal immunological findings in serum: Secondary | ICD-10-CM

## 2021-05-16 DIAGNOSIS — M19041 Primary osteoarthritis, right hand: Secondary | ICD-10-CM

## 2021-05-16 DIAGNOSIS — E7800 Pure hypercholesterolemia, unspecified: Secondary | ICD-10-CM | POA: Diagnosis not present

## 2021-05-16 DIAGNOSIS — I10 Essential (primary) hypertension: Secondary | ICD-10-CM

## 2021-05-16 DIAGNOSIS — M1712 Unilateral primary osteoarthritis, left knee: Secondary | ICD-10-CM

## 2021-05-16 DIAGNOSIS — I639 Cerebral infarction, unspecified: Secondary | ICD-10-CM

## 2021-05-16 DIAGNOSIS — M19071 Primary osteoarthritis, right ankle and foot: Secondary | ICD-10-CM | POA: Diagnosis not present

## 2021-05-16 DIAGNOSIS — I739 Peripheral vascular disease, unspecified: Secondary | ICD-10-CM | POA: Diagnosis not present

## 2021-05-16 DIAGNOSIS — I440 Atrioventricular block, first degree: Secondary | ICD-10-CM | POA: Diagnosis not present

## 2021-05-16 DIAGNOSIS — E0590 Thyrotoxicosis, unspecified without thyrotoxic crisis or storm: Secondary | ICD-10-CM

## 2021-05-16 DIAGNOSIS — R768 Other specified abnormal immunological findings in serum: Secondary | ICD-10-CM

## 2021-05-16 DIAGNOSIS — M19072 Primary osteoarthritis, left ankle and foot: Secondary | ICD-10-CM

## 2021-05-16 DIAGNOSIS — N289 Disorder of kidney and ureter, unspecified: Secondary | ICD-10-CM

## 2021-05-16 DIAGNOSIS — M19042 Primary osteoarthritis, left hand: Secondary | ICD-10-CM

## 2021-05-16 NOTE — Patient Instructions (Signed)
Journal for Nurse Practitioners, 15(4), 263-267. Retrieved August 31, 2018 from [email or web address].[email or web address]">  Knee Exercises Ask your health care provider which exercises are safe for you. Do exercises exactly as told by your health care provider and adjust them as directed. It is normal to feel mild stretching, pulling, tightness, or discomfort as you do these exercises. Stop right away if you feel sudden pain or your pain gets worse. Do not begin these exercises until told by your health care provider. Stretching and range-of-motion exercises These exercises warm up your muscles and joints and improve the movement and flexibility of your knee. These exercises also help to relieve pain andswelling. Knee extension, prone Lie on your abdomen (prone position) on a bed. Place your left / right knee just beyond the edge of the surface so your knee is not on the bed. You can put a towel under your left / right thigh just above your kneecap for comfort. Relax your leg muscles and allow gravity to straighten your knee (extension). You should feel a stretch behind your left / right knee. Hold this position for __________ seconds. Scoot up so your knee is supported between repetitions. Repeat __________ times. Complete this exercise __________ times a day. Knee flexion, active  Lie on your back with both legs straight. If this causes back discomfort, bend your left / right knee so your foot is flat on the floor. Slowly slide your left / right heel back toward your buttocks. Stop when you feel a gentle stretch in the front of your knee or thigh (flexion). Hold this position for __________ seconds. Slowly slide your left / right heel back to the starting position. Repeat __________ times. Complete this exercise __________ times a day. Quadriceps stretch, prone  Lie on your abdomen on a firm surface, such as a bed or padded floor. Bend your left / right knee and hold your ankle. If you cannot reach  your ankle or pant leg, loop a belt around your foot and grab the belt instead. Gently pull your heel toward your buttocks. Your knee should not slide out to the side. You should feel a stretch in the front of your thigh and knee (quadriceps). Hold this position for __________ seconds. Repeat __________ times. Complete this exercise __________ times a day. Hamstring, supine Lie on your back (supine position). Loop a belt or towel over the ball of your left / right foot. The ball of your foot is on the walking surface, right under your toes. Straighten your left / right knee and slowly pull on the belt to raise your leg until you feel a gentle stretch behind your knee (hamstring). Do not let your knee bend while you do this. Keep your other leg flat on the floor. Hold this position for __________ seconds. Repeat __________ times. Complete this exercise __________ times a day. Strengthening exercises These exercises build strength and endurance in your knee. Endurance is theability to use your muscles for a long time, even after they get tired. Quadriceps, isometric This exercise stretches the muscles in front of your thigh (quadriceps) without moving your knee joint (isometric). Lie on your back with your left / right leg extended and your other knee bent. Put a rolled towel or small pillow under your knee if told by your health care provider. Slowly tense the muscles in the front of your left / right thigh. You should see your kneecap slide up toward your hip or see increased dimpling just above the knee. This motion will   push the back of the knee toward the floor. For __________ seconds, hold the muscle as tight as you can without increasing your pain. Relax the muscles slowly and completely. Repeat __________ times. Complete this exercise __________ times a day. Straight leg raises This exercise stretches the muscles in front of your thigh (quadriceps) and the muscles that move your hips (hip  flexors). Lie on your back with your left / right leg extended and your other knee bent. Tense the muscles in the front of your left / right thigh. You should see your kneecap slide up or see increased dimpling just above the knee. Your thigh may even shake a bit. Keep these muscles tight as you raise your leg 4-6 inches (10-15 cm) off the floor. Do not let your knee bend. Hold this position for __________ seconds. Keep these muscles tense as you lower your leg. Relax your muscles slowly and completely after each repetition. Repeat __________ times. Complete this exercise __________ times a day. Hamstring, isometric Lie on your back on a firm surface. Bend your left / right knee about __________ degrees. Dig your left / right heel into the surface as if you are trying to pull it toward your buttocks. Tighten the muscles in the back of your thighs (hamstring) to "dig" as hard as you can without increasing any pain. Hold this position for __________ seconds. Release the tension gradually and allow your muscles to relax completely for __________ seconds after each repetition. Repeat __________ times. Complete this exercise __________ times a day. Hamstring curls If told by your health care provider, do this exercise while wearing ankle weights. Begin with __________ lb weights. Then increase the weight by 1 lb (0.5 kg) increments. Do not wear ankle weights that are more than __________ lb. Lie on your abdomen with your legs straight. Bend your left / right knee as far as you can without feeling pain. Keep your hips flat against the floor. Hold this position for __________ seconds. Slowly lower your leg to the starting position. Repeat __________ times. Complete this exercise __________ times a day. Squats This exercise strengthens the muscles in front of your thigh and knee (quadriceps). Stand in front of a table, with your feet and knees pointing straight ahead. You may rest your hands on the  table for balance but not for support. Slowly bend your knees and lower your hips like you are going to sit in a chair. Keep your weight over your heels, not over your toes. Keep your lower legs upright so they are parallel with the table legs. Do not let your hips go lower than your knees. Do not bend lower than told by your health care provider. If your knee pain increases, do not bend as low. Hold the squat position for __________ seconds. Slowly push with your legs to return to standing. Do not use your hands to pull yourself to standing. Repeat __________ times. Complete this exercise __________ times a day. Wall slides This exercise strengthens the muscles in front of your thigh and knee (quadriceps). Lean your back against a smooth wall or door, and walk your feet out 18-24 inches (46-61 cm) from it. Place your feet hip-width apart. Slowly slide down the wall or door until your knees bend __________ degrees. Keep your knees over your heels, not over your toes. Keep your knees in line with your hips. Hold this position for __________ seconds. Repeat __________ times. Complete this exercise __________ times a day. Straight leg raises This exercise   strengthens the muscles that rotate the leg at the hip and move it away from your body (hip abductors). Lie on your side with your left / right leg in the top position. Lie so your head, shoulder, knee, and hip line up. You may bend your bottom knee to help you keep your balance. Roll your hips slightly forward so your hips are stacked directly over each other and your left / right knee is facing forward. Leading with your heel, lift your top leg 4-6 inches (10-15 cm). You should feel the muscles in your outer hip lifting. Do not let your foot drift forward. Do not let your knee roll toward the ceiling. Hold this position for __________ seconds. Slowly return your leg to the starting position. Let your muscles relax completely after each  repetition. Repeat __________ times. Complete this exercise __________ times a day. Straight leg raises This exercise stretches the muscles that move your hips away from the front of the pelvis (hip extensors). Lie on your abdomen on a firm surface. You can put a pillow under your hips if that is more comfortable. Tense the muscles in your buttocks and lift your left / right leg about 4-6 inches (10-15 cm). Keep your knee straight as you lift your leg. Hold this position for __________ seconds. Slowly lower your leg to the starting position. Let your leg relax completely after each repetition. Repeat __________ times. Complete this exercise __________ times a day. This information is not intended to replace advice given to you by your health care provider. Make sure you discuss any questions you have with your healthcare provider. Document Revised: 09/01/2018 Document Reviewed: 09/01/2018 Elsevier Patient Education  2022 [employer] Hand Exercises Hand exercises can be helpful for almost anyone. These exercises can strengthen the hands, improve flexibility and movement, and increase blood flow to the hands. These results can make work and daily tasks easier. Hand exercises can be especially helpful for people who have joint pain from arthritis or have nerve damage from overuse (carpal tunnel syndrome). These exercises can also help people who have injured a hand. Exercises Most of these hand exercises are gentle stretching and motion exercises. It is usually safe to do them often throughout the day. Warming up your hands before exercise may help to reduce stiffness. You can do this with gentle massage orby placing your hands in warm water for 10-15 minutes. It is normal to feel some stretching, pulling, tightness, or mild discomfort as you begin new exercises. This will gradually improve. Stop an exercise right away if you feel sudden, severe pain or your pain gets worse. Ask your healthcare  provider which exercises are best for you. Knuckle bend or "claw" fist Stand or sit with your arm, hand, and all five fingers pointed straight up. Make sure to keep your wrist straight during the exercise. Gently bend your fingers down toward your palm until the tips of your fingers are touching the top of your palm. Keep your big knuckle straight and just bend the small knuckles in your fingers. Hold this position for __________ seconds. Straighten (extend) your fingers back to the starting position. Repeat this exercise 5-10 times with each hand. Full finger fist Stand or sit with your arm, hand, and all five fingers pointed straight up. Make sure to keep your wrist straight during the exercise. Gently bend your fingers into your palm until the tips of your fingers are touching the middle of your palm. Hold this position for __________ seconds.   Extend your fingers back to the starting position, stretching every joint fully. Repeat this exercise 5-10 times with each hand. Straight fist Stand or sit with your arm, hand, and all five fingers pointed straight up. Make sure to keep your wrist straight during the exercise. Gently bend your fingers at the big knuckle, where your fingers meet your hand, and the middle knuckle. Keep the knuckle at the tips of your fingers straight and try to touch the bottom of your palm. Hold this position for __________ seconds. Extend your fingers back to the starting position, stretching every joint fully. Repeat this exercise 5-10 times with each hand. Tabletop Stand or sit with your arm, hand, and all five fingers pointed straight up. Make sure to keep your wrist straight during the exercise. Gently bend your fingers at the big knuckle, where your fingers meet your hand, as far down as you can while keeping the small knuckles in your fingers straight. Think of forming a tabletop with your fingers. Hold this position for __________ seconds. Extend your fingers  back to the starting position, stretching every joint fully. Repeat this exercise 5-10 times with each hand. Finger spread Place your hand flat on a table with your palm facing down. Make sure your wrist stays straight as you do this exercise. Spread your fingers and thumb apart from each other as far as you can until you feel a gentle stretch. Hold this position for __________ seconds. Bring your fingers and thumb tight together again. Hold this position for __________ seconds. Repeat this exercise 5-10 times with each hand. Making circles Stand or sit with your arm, hand, and all five fingers pointed straight up. Make sure to keep your wrist straight during the exercise. Make a circle by touching the tip of your thumb to the tip of your index finger. Hold for __________ seconds. Then open your hand wide. Repeat this motion with your thumb and each finger on your hand. Repeat this exercise 5-10 times with each hand. Thumb motion Sit with your forearm resting on a table and your wrist straight. Your thumb should be facing up toward the ceiling. Keep your fingers relaxed as you move your thumb. Lift your thumb up as high as you can toward the ceiling. Hold for __________ seconds. Bend your thumb across your palm as far as you can, reaching the tip of your thumb for the small finger (pinkie) side of your palm. Hold for __________ seconds. Repeat this exercise 5-10 times with each hand. Grip strengthening  Hold a stress ball or other soft ball in the middle of your hand. Slowly increase the pressure, squeezing the ball as much as you can without causing pain. Think of bringing the tips of your fingers into the middle of your palm. All of your finger joints should bend when doing this exercise. Hold your squeeze for __________ seconds, then relax. Repeat this exercise 5-10 times with each hand. Contact a health care provider if: Your hand pain or discomfort gets much worse when you do an  exercise. Your hand pain or discomfort does not improve within 2 hours after you exercise. If you have any of these problems, stop doing these exercises right away. Do not do them again unless your health care provider says that you can. Get help right away if: You develop sudden, severe hand pain or swelling. If this happens, stop doing these exercises right away. Do not do them again unless your health care provider says that you can. This   information is not intended to replace advice given to you by your health care provider. Make sure you discuss any questions you have with your healthcare provider. Document Revised: 03/04/2019 Document Reviewed: 11/12/2018 Elsevier Patient Education  2022 [employer] Inc[employer]  

## 2021-05-24 DIAGNOSIS — I10 Essential (primary) hypertension: Secondary | ICD-10-CM | POA: Diagnosis not present

## 2021-06-04 ENCOUNTER — Other Ambulatory Visit: Payer: Self-pay | Admitting: Nurse Practitioner

## 2021-06-04 ENCOUNTER — Other Ambulatory Visit: Payer: Self-pay | Admitting: Cardiology

## 2021-06-04 DIAGNOSIS — E782 Mixed hyperlipidemia: Secondary | ICD-10-CM

## 2021-06-04 DIAGNOSIS — E781 Pure hyperglyceridemia: Secondary | ICD-10-CM

## 2021-06-20 ENCOUNTER — Telehealth: Payer: Self-pay

## 2021-06-20 ENCOUNTER — Telehealth: Payer: Medicare HMO

## 2021-06-20 NOTE — Telephone Encounter (Signed)
  Care Management   Follow Up Note   06/20/2021 Name: Damere Brandenburg MRN: [id number] DOB: 1933/02/02   Referred by: [name], FNP Reason for referral : Chronic Care Management (RN CM Follow up call )   An unsuccessful telephone outreach was attempted today. The patient was referred to the case management team for assistance with care management and care coordination.   Follow Up Plan: A HIPPA compliant phone message was left for the patient providing contact information and requesting a return call.   [name], RN, BSN, CCM Care Management Coordinator [facility]  Direct Phone: [phone]

## 2021-06-29 DIAGNOSIS — I10 Essential (primary) hypertension: Secondary | ICD-10-CM | POA: Diagnosis not present

## 2021-07-02 ENCOUNTER — Other Ambulatory Visit: Payer: Self-pay | Admitting: Cardiology

## 2021-07-04 NOTE — Progress Notes (Signed)
Subjective:   Jesus Lam, male    DOB: February 01, 1933, 87 y.o.   MRN: [id number]   Chief complaint:  PAD   Hypertension Pertinent negatives include no chest pain or palpitations.   87 year old African-American male with severe bilateral PAD with class III claudication, now s/p successful DCB Rt SFA and 2 overlapping Zilver PTX stents placed left SFA (12/2017), s/p left temporal occipital cortical infarct (05/2018).  He has recently and bilateral leg pain, attributed to knee osteoarthritis. He is contemplating surgery. Consequently, blood pressure is elevated. Home readings show avg BP 148/76, HR 56 bpm.  On further questioning, patient complains of left calf pain on walking. [facility]    Current Outpatient Medications on File Prior to Visit  Medication Sig Dispense Refill   amLODipine (NORVASC) 5 MG tablet TAKE ONE TABLET BY MOUTH DAILY 90 tablet 2   atorvastatin (LIPITOR) 40 MG tablet TAKE TWO TABLETS BY MOUTH DAILY 90 tablet 3   atorvastatin (LIPITOR) 80 MG tablet TAKE 1 TABLET ONE TIME DAILY 5 tablet 0   bisoprolol (ZEBETA) 5 MG tablet TAKE HALF TABLET BY MOUTH DAILY 60 tablet 2   chlorthalidone (HYGROTON) 25 MG tablet Take 1 tablet (25 mg total) by mouth daily. 5 tablet 0   Cholecalciferol (VITAMIN D3) 5000 units CAPS Take 5,000 Units by mouth daily.     clopidogrel (PLAVIX) 75 MG tablet TAKE ONE TABLET BY MOUTH DAILY 90 tablet 3   diclofenac Sodium (VOLTAREN) 1 % GEL APPLY TWO GRAMS TOPICALLY FOUR TIMES A DAY 100 g 0   donepezil (ARICEPT) 10 MG tablet Take 1 tablet (10 mg total) by mouth at bedtime. 90 tablet 1   Elastic Bandages & Supports (MEDICAL COMPRESSION STOCKINGS) MISC      furosemide (LASIX) 20 MG tablet TAKE ONE TABLET BY MOUTH DAILY 90 tablet 1   gabapentin (NEURONTIN) 300 MG capsule TAKE ONE CAPSULE BY MOUTH AT BEDTIME 90 capsule 0   hydrALAZINE (APRESOLINE) 25 MG tablet TAKE ONE TABLET BY MOUTH TWICE A DAY 60  tablet 1   icosapent Ethyl (VASCEPA) 1 g capsule TAKE ONE CAPSULE BY MOUTH TWICE A DAY 60 capsule 0   isosorbide mononitrate (IMDUR) 30 MG 24 hr tablet TAKE ONE TABLET BY MOUTH TWICE A DAY 60 tablet 1   ketorolac (ACULAR) 0.5 % ophthalmic solution ketorolac 0.5 % eye drops     levocetirizine (XYZAL) 5 MG tablet Take 1 tablet (5 mg total) by mouth every evening. 90 tablet 1   Menthol-Methyl Salicylate (MUSCLE RUB) 10-15 % CREA muscle rub     methimazole (TAPAZOLE) 10 MG tablet TAKE ONE TABLET BY MOUTH DAILY 30 tablet 0   Multiple Vitamins-Minerals (COMPLETE SENIOR PO) 1 tablet daily.      tamsulosin (FLOMAX) 0.4 MG CAPS capsule TAKE ONE CAPSULE BY MOUTH DAILY HALF HOUR FOLLOWING THE SAME MEAL DAILY 90 capsule 1   vitamin B-12 (CYANOCOBALAMIN) 500 MCG tablet Take 500 mcg by mouth daily.     vitamin C (ASCORBIC ACID) 500 MG tablet Take 500 mg daily by mouth.     Current Facility-Administered Medications on File Prior to Visit  Medication Dose Route Frequency Provider Last Rate Last Admin   0.9 %  sodium chloride infusion   Intravenous PRN Bary Castilla, NP        Cardiovascular studies:  EKG 07/05/2021: Sinus rhythm 62 bpm First degree A-V block  Occasional PAC    Left  ventricular hypertrophy Old anteroseptal infarct Right bundle branch block Left anterior fascicular block    ABI 02/05/2018: This exam reveals moderately decreased perfusion of the lower extremity, noted at  the post tibial artery level (ABI). Right ABI 0.79 and left ABI 0.71. Compared to  11/19/2017:   LABI 0.67 and RABI 0.59.  Periperal angiography 01/27/2018:  Right mid and distal SFA CTO, moderate calcification evident. Successful PTA and stenting of the CTO right mid and distal SFA with antegrade left femoral and retrograde right PT access and implantation of 2 overlapping 6.0 x 1 20 mm Zilver PTX drug-coated stents. Stenosis reduced from 100% to 0%. Brisk flow was evident at the end of the  procedure..  Echocardiogram 07/24/2018: 1. Left ventricle cavity is normal in size. Mild concentric hypertrophy of the left ventricle. Doppler evidence of grade I (impaired) diastolic dysfunction, normal LAP. Left ventricle regional wall motion findings: Basal and mid inferior hypokinesis. Calculated EF 46%. 2. Moderate aortic valve leaflet thickening. Mildly restricted aortic valve leaflets. No evidence of aortic valve stenosis. Mild (Grade I) aortic regurgitation. 3. Structurally normal mitral valve with mild (Grade I) regurgitation. 4. Compared to the study done on 09/20/2017, no significant change in LVEF. Wall motion appears to be new. Previously mitral regurgitation was mild to moderate.   Recent labs: 02/08/2021: Glucose 90, BUN/Cr 15/1.39. EGFR 49. Na/K 136/5.1. Rest of the CMP normal Chol 124, TG 203, HDL 32, LDL 59 TSH 1.1 normal  04/27/2020: Glucose 86, BUN/Cr 12/1.16. EGFR 66. Na/K 141/4.8. Rest of the CMP normal Chol 120, TG 165, HDL 34, LDL 58 TSH 0.9 normal  02/03/2020: Glucose 81, BUN/Cr 16/1.1. EGFR 70. Na/K 142/4.1. Rest of the CMP normal HbA1C 5.1% Chol 140, TG 253, HDL 31, LDL 68 TSH 8.5, elevated  12/29/2019: H/H 11.7/36.4. MCV 92. Platelets 194 Chol 108, TG 143, HDL 32, LDL 51   Review of Systems  Cardiovascular:  Positive for claudication (Left leg). Negative for chest pain, dyspnea on exertion, leg swelling, palpitations and syncope.  Musculoskeletal:  Positive for joint pain.      Vitals:   07/05/21 1000  BP: (!) 183/83  Pulse: 69  Resp: 16  Temp: 98.7 F (37.1 C)  SpO2: 97%     Body mass index is 33.47 kg/m. Filed Weights   07/05/21 1000  Weight: 240 lb (108.9 kg)      Objective:    Physical Exam Vitals and nursing note reviewed.  Constitutional:      Appearance: He is well-developed.  Neck:     Vascular: No JVD.  Cardiovascular:     Rate and Rhythm: Normal rate and regular rhythm.     Pulses:          Femoral pulses are 3+ on the  right side and 3+ on the left side.      Popliteal pulses are 1+ on the right side and 1+ on the left side.       Dorsalis pedis pulses are 0 on the right side and 0 on the left side.       Posterior tibial pulses are 0 on the right side and 0 on the left side.     Heart sounds: Normal heart sounds. No murmur heard. Pulmonary:     Effort: Pulmonary effort is normal.     Breath sounds: Normal breath sounds. No wheezing or rales.          Assessment & Recommendations:   87 year old African-American male with PAD, h/o left temporal  occipital cortical infarct (05/2018).  PAD: DCB Rt SFA and 2 overlapping Zilver PTX stents placed left SFA (12/2017), [place] class III claudication with recent worsening, certainly contributing to his leg pain, in addition to OA. Will obtain LEA duplex. Continue medical management. Not on Pletal due to prior h/o heart failure  Hypertension: Uncontrolled.  Increase hydralazine to 50 mg bid.   Hypertriglyceridemia: Improved on lipitor and vascepa.  F/u in 6 months  Millie Shorb Esther Hardy, MD [facility] [place] Pager: [phone] Office: [phone] If no answer Cell [phone]

## 2021-07-05 ENCOUNTER — Encounter: Payer: Self-pay | Admitting: Cardiology

## 2021-07-05 ENCOUNTER — Ambulatory Visit: Payer: Medicare HMO | Admitting: Cardiology

## 2021-07-05 ENCOUNTER — Other Ambulatory Visit: Payer: Self-pay

## 2021-07-05 VITALS — BP 133/74 | HR 60 | Temp 98.7°F | Resp 98 | Ht 71.0 in | Wt 240.0 lb

## 2021-07-05 DIAGNOSIS — I10 Essential (primary) hypertension: Secondary | ICD-10-CM

## 2021-07-05 DIAGNOSIS — I739 Peripheral vascular disease, unspecified: Secondary | ICD-10-CM

## 2021-07-05 DIAGNOSIS — E782 Mixed hyperlipidemia: Secondary | ICD-10-CM

## 2021-07-05 MED ORDER — HYDRALAZINE HCL 25 MG PO TABS: 50.0000 mg | ORAL_TABLET | Freq: Three times a day (TID) | ORAL | 0 refills | Status: DC

## 2021-07-05 MED ORDER — HYDRALAZINE HCL 25 MG PO TABS: 50.0000 mg | ORAL_TABLET | Freq: Two times a day (BID) | ORAL | 0 refills | Status: DC

## 2021-07-11 ENCOUNTER — Ambulatory Visit: Payer: Medicare HMO | Admitting: Cardiology

## 2021-07-17 ENCOUNTER — Other Ambulatory Visit: Payer: Medicare HMO

## 2021-07-24 ENCOUNTER — Ambulatory Visit: Payer: Medicare HMO

## 2021-07-24 ENCOUNTER — Other Ambulatory Visit: Payer: Self-pay

## 2021-07-24 DIAGNOSIS — I739 Peripheral vascular disease, unspecified: Secondary | ICD-10-CM

## 2021-07-30 DIAGNOSIS — I10 Essential (primary) hypertension: Secondary | ICD-10-CM | POA: Diagnosis not present

## 2021-07-31 ENCOUNTER — Ambulatory Visit: Payer: Medicare HMO | Admitting: Cardiology

## 2021-08-01 ENCOUNTER — Ambulatory Visit: Payer: Medicare HMO | Admitting: Cardiology

## 2021-08-01 ENCOUNTER — Other Ambulatory Visit: Payer: Self-pay

## 2021-08-01 ENCOUNTER — Encounter: Payer: Self-pay | Admitting: Cardiology

## 2021-08-01 VITALS — BP 159/70 | HR 64 | Temp 98.0°F | Resp 16 | Ht 72.0 in | Wt 236.0 lb

## 2021-08-01 DIAGNOSIS — R600 Localized edema: Secondary | ICD-10-CM | POA: Diagnosis not present

## 2021-08-01 DIAGNOSIS — I739 Peripheral vascular disease, unspecified: Secondary | ICD-10-CM | POA: Diagnosis not present

## 2021-08-01 DIAGNOSIS — I10 Essential (primary) hypertension: Secondary | ICD-10-CM

## 2021-08-01 DIAGNOSIS — E782 Mixed hyperlipidemia: Secondary | ICD-10-CM

## 2021-08-01 MED ORDER — FUROSEMIDE 40 MG PO TABS: 40.0000 mg | ORAL_TABLET | Freq: Every day | ORAL | 3 refills | Status: DC

## 2021-08-01 NOTE — Progress Notes (Signed)
Subjective:   Jesus Lam, male    DOB: [birth year], 87 y.o.   MRN: [id number]   Chief complaint:  PAD   87 year old African-American male with severe bilateral PAD with class III claudication, now s/p successful DCB Rt SFA and 2 overlapping Zilver PTX stents placed left SFA (12/2017), s/p left temporal occipital cortical infarct (05/2018).  He is able to swim laps without much difficulty, but has pain in left knee on walking. He does have bilateral leg swelling today, but denies any significant shortness of breath, chest pain symptoms.    Current Outpatient Medications on File Prior to Visit  Medication Sig Dispense Refill   amLODipine (NORVASC) 5 MG tablet TAKE ONE TABLET BY MOUTH DAILY 90 tablet 2   atorvastatin (LIPITOR) 80 MG tablet TAKE 1 TABLET ONE TIME DAILY 5 tablet 0   bisoprolol (ZEBETA) 5 MG tablet TAKE HALF TABLET BY MOUTH DAILY 60 tablet 2   chlorthalidone (HYGROTON) 25 MG tablet Take 1 tablet (25 mg total) by mouth daily. 5 tablet 0   Cholecalciferol (VITAMIN D3) 5000 units CAPS Take 5,000 Units by mouth daily.     clopidogrel (PLAVIX) 75 MG tablet TAKE ONE TABLET BY MOUTH DAILY 90 tablet 3   diclofenac Sodium (VOLTAREN) 1 % GEL APPLY TWO GRAMS TOPICALLY FOUR TIMES A DAY 100 g 0   donepezil (ARICEPT) 10 MG tablet Take 1 tablet (10 mg total) by mouth at bedtime. 90 tablet 1   Elastic Bandages & Supports (MEDICAL COMPRESSION STOCKINGS) MISC      gabapentin (NEURONTIN) 300 MG capsule TAKE ONE CAPSULE BY MOUTH AT BEDTIME 90 capsule 0   hydrALAZINE (APRESOLINE) 25 MG tablet Take 2 tablets (50 mg total) by mouth in the morning and at bedtime. 1 tablet 0   icosapent Ethyl (VASCEPA) 1 g capsule TAKE ONE CAPSULE BY MOUTH TWICE A DAY 60 capsule 0   isosorbide mononitrate (IMDUR) 30 MG 24 hr tablet TAKE ONE TABLET BY MOUTH TWICE A DAY 60 tablet 1   ketorolac (ACULAR) 0.5 % ophthalmic solution ketorolac 0.5 % eye drops     levocetirizine (XYZAL) 5 MG tablet Take 1 tablet (5 mg  total) by mouth every evening. 90 tablet 1   Menthol-Methyl Salicylate (MUSCLE RUB) 10-15 % CREA muscle rub     methimazole (TAPAZOLE) 10 MG tablet TAKE ONE TABLET BY MOUTH DAILY 30 tablet 0   Multiple Vitamins-Minerals (COMPLETE SENIOR PO) 1 tablet daily.      tamsulosin (FLOMAX) 0.4 MG CAPS capsule TAKE ONE CAPSULE BY MOUTH DAILY HALF HOUR FOLLOWING THE SAME MEAL DAILY 90 capsule 1   vitamin B-12 (CYANOCOBALAMIN) 500 MCG tablet Take 500 mcg by mouth daily.     vitamin C (ASCORBIC ACID) 500 MG tablet Take 500 mg daily by mouth.     Current Facility-Administered Medications on File Prior to Visit  Medication Dose Route Frequency Provider Last Rate Last Admin   0.9 %  sodium chloride infusion   Intravenous PRN Bary Castilla, NP        Cardiovascular studies:  EKG 07/05/2021: Sinus rhythm 62 bpm First degree A-V block  Occasional PAC    Left ventricular hypertrophy Old anteroseptal infarct Right bundle branch block Left anterior fascicular block    ABI 02/05/2018: This exam reveals moderately decreased perfusion of the lower extremity, noted at  the post tibial artery level (ABI). Right ABI 0.79 and left ABI 0.71. Compared to  11/19/2017:   LABI 0.67 and RABI 0.59.  Periperal angiography 01/27/2018:  Right mid and distal SFA CTO, moderate calcification evident. Successful PTA and stenting of the CTO right mid and distal SFA with antegrade left femoral and retrograde right PT access and implantation of 2 overlapping 6.0 x 1 20 mm Zilver PTX drug-coated stents. Stenosis reduced from 100% to 0%. Brisk flow was evident at the end of the procedure..  Echocardiogram 07/24/2018: 1. Left ventricle cavity is normal in size. Mild concentric hypertrophy of the left ventricle. Doppler evidence of grade I (impaired) diastolic dysfunction, normal LAP. Left ventricle regional wall motion findings: Basal and mid inferior hypokinesis. Calculated EF 46%. 2. Moderate aortic valve leaflet thickening.  Mildly restricted aortic valve leaflets. No evidence of aortic valve stenosis. Mild (Grade I) aortic regurgitation. 3. Structurally normal mitral valve with mild (Grade I) regurgitation. 4. Compared to the study done on 09/20/2017, no significant change in LVEF. Wall motion appears to be new. Previously mitral regurgitation was mild to moderate.   Recent labs: 02/08/2021: Glucose 90, BUN/Cr 15/1.39. EGFR 49. Na/K 136/5.1. Rest of the CMP normal Chol 124, TG 203, HDL 32, LDL 59 TSH 1.1 normal  04/27/2020: Glucose 86, BUN/Cr 12/1.16. EGFR 66. Na/K 141/4.8. Rest of the CMP normal Chol 120, TG 165, HDL 34, LDL 58 TSH 0.9 normal  02/03/2020: Glucose 81, BUN/Cr 16/1.1. EGFR 70. Na/K 142/4.1. Rest of the CMP normal HbA1C 5.1% Chol 140, TG 253, HDL 31, LDL 68 TSH 8.5, elevated  12/29/2019: H/H 11.7/36.4. MCV 92. Platelets 194 Chol 108, TG 143, HDL 32, LDL 51   Review of Systems  Cardiovascular:  Positive for claudication (Left leg) and leg swelling. Negative for chest pain, dyspnea on exertion, palpitations and syncope.  Musculoskeletal:  Positive for joint pain.      Vitals:   08/01/21 1353  BP: (!) 159/70  Pulse: 64  Resp: 16  Temp: 98 F (36.7 C)  SpO2: 97%     Body mass index is 32.01 kg/m. Filed Weights   08/01/21 1353  Weight: 236 lb (107 kg)      Objective:    Physical Exam Vitals and nursing note reviewed.  Constitutional:      Appearance: He is well-developed.  Neck:     Vascular: No JVD.  Cardiovascular:     Rate and Rhythm: Normal rate and regular rhythm.     Pulses:          Femoral pulses are 3+ on the right side and 3+ on the left side.      Popliteal pulses are 1+ on the right side and 1+ on the left side.       Dorsalis pedis pulses are 0 on the right side and 0 on the left side.       Posterior tibial pulses are 0 on the right side and 0 on the left side.     Heart sounds: Normal heart sounds. No murmur heard. Pulmonary:     Effort: Pulmonary  effort is normal.     Breath sounds: Normal breath sounds. No wheezing or rales.  Musculoskeletal:     Right lower leg: Edema (1+) present.     Left lower leg: Edema (1+) present.          Assessment & Recommendations:   87 year old African-American male with PAD, h/o left temporal occipital cortical infarct (05/2018).  PAD: DCB Rt SFA and 2 overlapping Zilver PTX stents placed left SFA (12/2017). B/l moderately reduced ABI, details above. However, his pain is primarily in left knee likely due to osteoarthritis.  If pain does not improve after OA management, will consider peripheral angiography and possible intervention. Continue medical management. Not on Pletal due to prior h/o heart failure  Hypertension: Uncontrolled. Has 1+ b/l leg edema. Increased lasix to 40 mg  Hypertriglyceridemia: Improved on lipitor and vascepa.  F/u in 3 months  [place], MD [facility] Pager: [phone] Office: [phone] If no answer Cell [phone]

## 2021-08-02 ENCOUNTER — Other Ambulatory Visit: Payer: Medicare HMO

## 2021-08-09 ENCOUNTER — Other Ambulatory Visit: Payer: Self-pay | Admitting: Cardiology

## 2021-08-15 ENCOUNTER — Ambulatory Visit (INDEPENDENT_AMBULATORY_CARE_PROVIDER_SITE_OTHER): Payer: Medicare HMO | Admitting: Nurse Practitioner

## 2021-08-15 ENCOUNTER — Other Ambulatory Visit: Payer: Self-pay

## 2021-08-15 VITALS — BP 166/74 | HR 56 | Temp 98.6°F | Ht 72.0 in | Wt 239.0 lb

## 2021-08-15 DIAGNOSIS — H9193 Unspecified hearing loss, bilateral: Secondary | ICD-10-CM

## 2021-08-15 DIAGNOSIS — Z23 Encounter for immunization: Secondary | ICD-10-CM

## 2021-08-15 DIAGNOSIS — Z6832 Body mass index (BMI) 32.0-32.9, adult: Secondary | ICD-10-CM

## 2021-08-15 DIAGNOSIS — Z79899 Other long term (current) drug therapy: Secondary | ICD-10-CM

## 2021-08-15 DIAGNOSIS — H6123 Impacted cerumen, bilateral: Secondary | ICD-10-CM

## 2021-08-15 DIAGNOSIS — E6609 Other obesity due to excess calories: Secondary | ICD-10-CM | POA: Diagnosis not present

## 2021-08-15 DIAGNOSIS — I10 Essential (primary) hypertension: Secondary | ICD-10-CM | POA: Diagnosis not present

## 2021-08-15 DIAGNOSIS — B079 Viral wart, unspecified: Secondary | ICD-10-CM

## 2021-08-15 MED ORDER — ZOSTER VAC RECOMB ADJUVANTED 50 MCG/0.5ML IM SUSR: 0.5000 mL | Freq: Once | INTRAMUSCULAR | 1 refills | Status: AC

## 2021-08-15 MED ORDER — AZELASTINE HCL 0.1 % NA SOLN: 2.0000 | Freq: Two times a day (BID) | NASAL | 5 refills | Status: AC

## 2021-08-15 MED ORDER — TETANUS-DIPHTH-ACELL PERTUSSIS 5-2.5-18.5 LF-MCG/0.5 IM SUSP: 0.5000 mL | Freq: Once | INTRAMUSCULAR | 0 refills | Status: AC

## 2021-08-15 NOTE — Progress Notes (Signed)
I,Tianna Badgett,acting as a Education administrator for [employer], FNP.,have documented all relevant documentation on the behalf of Minette Brine, FNP,as directed by  Minette Brine, FNP while in the presence of Minette Brine, [place].  This visit occurred during the SARS-CoV-2 public health emergency.  Safety protocols were in place, including screening questions prior to the visit, additional usage of staff PPE, and extensive cleaning of exam room while observing appropriate contact time as indicated for disinfecting solutions.  Subjective:     Patient ID: Jesus Lam , male    DOB: 1933-07-31 , 87 y.o.   MRN: [id number]   Chief Complaint  Patient presents with   Hypertension    HPI  Pt is here today for a hypertension follow up   Hypertension This is a chronic problem. The current episode started more than 1 year ago. The problem is unchanged. The problem is controlled. Pertinent negatives include no anxiety, chest pain, headaches or palpitations. There are no associated agents to hypertension. Risk factors for coronary artery disease include obesity, male gender and sedentary lifestyle. Past treatments include calcium channel blockers. The current treatment provides moderate improvement. There are no compliance problems.  There is no history of angina. Identifiable causes of hypertension include a thyroid problem. There is no history of chronic renal disease.    Past Medical History:  Diagnosis Date   Anemia    BPH (benign prostatic hyperplasia)    Cataracts, bilateral    CVA (cerebral vascular accident) ([place])    DVT (deep venous thrombosis) ([facility])    dvt in left leg   Dyslipidemia    Gout    Gout    HTN (hypertension)    Left hip pain 03/15/2020   Left leg pain    Osteoarthritis    PAD (peripheral artery disease) ([facility])    Stasis dermatitis    Stroke ([place])    Vitamin D deficiency      Family History  Problem Relation Age of Onset   Cancer Mother    Cancer Father    Alcohol abuse Father     Breast cancer Daughter    Stroke Daughter    CVA Daughter      Current Outpatient Medications:    azelastine (ASTELIN) 0.1 % nasal spray, Place 2 sprays into both nostrils 2 (two) times daily. Use in each nostril as directed, Disp: 30 mL, Rfl: 5   amLODipine (NORVASC) 5 MG tablet, TAKE ONE TABLET BY MOUTH DAILY, Disp: 90 tablet, Rfl: 2   atorvastatin (LIPITOR) 80 MG tablet, TAKE 1 TABLET ONE TIME DAILY, Disp: 5 tablet, Rfl: 0   bisoprolol (ZEBETA) 5 MG tablet, TAKE HALF TABLET BY MOUTH DAILY, Disp: 60 tablet, Rfl: 2   chlorthalidone (HYGROTON) 25 MG tablet, Take 1 tablet (25 mg total) by mouth daily., Disp: 5 tablet, Rfl: 0   Cholecalciferol (VITAMIN D3) 5000 units CAPS, Take 5,000 Units by mouth daily., Disp: , Rfl:    clopidogrel (PLAVIX) 75 MG tablet, TAKE ONE TABLET BY MOUTH DAILY, Disp: 90 tablet, Rfl: 3   diclofenac Sodium (VOLTAREN) 1 % GEL, APPLY TWO GRAMS TOPICALLY FOUR TIMES A DAY, Disp: 100 g, Rfl: 0   donepezil (ARICEPT) 10 MG tablet, Take 1 tablet (10 mg total) by mouth at bedtime., Disp: 90 tablet, Rfl: 1   Elastic Bandages & Supports (MEDICAL COMPRESSION STOCKINGS) MISC, , Disp: , Rfl:    furosemide (LASIX) 40 MG tablet, Take 1 tablet (40 mg total) by mouth daily., Disp: 60 tablet, Rfl: 3  gabapentin (NEURONTIN) 300 MG capsule, TAKE ONE CAPSULE BY MOUTH AT BEDTIME, Disp: 90 capsule, Rfl: 0   hydrALAZINE (APRESOLINE) 25 MG tablet, TAKE ONE TABLET BY MOUTH TWICE A DAY, Disp: 60 tablet, Rfl: 0   icosapent Ethyl (VASCEPA) 1 g capsule, TAKE ONE CAPSULE BY MOUTH TWICE A DAY, Disp: 60 capsule, Rfl: 0   isosorbide mononitrate (IMDUR) 30 MG 24 hr tablet, TAKE ONE TABLET BY MOUTH TWICE A DAY, Disp: 60 tablet, Rfl: 0   ketorolac (ACULAR) 0.5 % ophthalmic solution, ketorolac 0.5 % eye drops, Disp: , Rfl:    levocetirizine (XYZAL) 5 MG tablet, Take 1 tablet (5 mg total) by mouth every evening., Disp: 90 tablet, Rfl: 1   Menthol-Methyl Salicylate (MUSCLE RUB) 10-15 % CREA, muscle rub,  Disp: , Rfl:    methimazole (TAPAZOLE) 10 MG tablet, TAKE ONE TABLET BY MOUTH DAILY, Disp: 30 tablet, Rfl: 0   Multiple Vitamins-Minerals (COMPLETE SENIOR PO), 1 tablet daily. , Disp: , Rfl:    tamsulosin (FLOMAX) 0.4 MG CAPS capsule, TAKE ONE CAPSULE BY MOUTH DAILY HALF HOUR FOLLOWING THE SAME MEAL DAILY, Disp: 90 capsule, Rfl: 1   vitamin B-12 (CYANOCOBALAMIN) 500 MCG tablet, Take 500 mcg by mouth daily., Disp: , Rfl:    vitamin C (ASCORBIC ACID) 500 MG tablet, Take 500 mg daily by mouth., Disp: , Rfl:   Current Facility-Administered Medications:    0.9 %  sodium chloride infusion, , Intravenous, PRN, Ghumman, Ramandeep, NP   Allergies  Allergen Reactions   Shellfish Allergy Other (See Comments)    Gout      Review of Systems  Constitutional: Negative.   Respiratory: Negative.    Cardiovascular: Negative.  Negative for chest pain, palpitations and leg swelling.  Gastrointestinal: Negative.   Musculoskeletal:  Arthralgias: left knee pain - his orthopedic would like to replace his knee he is thinking about it.  Skin:        Left 4th finger with wart present  Neurological: Negative.  Negative for headaches.  Psychiatric/Behavioral: Negative.      Today's Vitals   08/15/21 1419  BP: (!) 166/74  Pulse: (!) 56  Temp: 98.6 F (37 C)  TempSrc: Oral  Weight: 239 lb (108.4 kg)  Height: 6' (1.829 m)   Body mass index is 32.41 kg/m.  Wt Readings from Last 3 Encounters:  08/15/21 239 lb (108.4 kg)  08/01/21 236 lb (107 kg)  07/05/21 240 lb (108.9 kg)    Objective:  Physical Exam Vitals reviewed.  Constitutional:      General: He is not in acute distress.    Appearance: Normal appearance. He is obese.  HENT:     Right Ear: External ear normal. There is impacted cerumen.     Left Ear: External ear normal. There is impacted cerumen.     Ears:     Comments: TM normal after cerumen removal Cardiovascular:     Rate and Rhythm: Normal rate and regular rhythm.     Pulses:  Normal pulses.     Heart sounds: Normal heart sounds. No murmur heard. Pulmonary:     Effort: Pulmonary effort is normal. No respiratory distress.     Breath sounds: Normal breath sounds. No wheezing.  Musculoskeletal:     Comments: Uses cane for ambulation  Skin:    General: Skin is warm.     Capillary Refill: Capillary refill takes less than 2 seconds.     Comments: Left 4th phalange with hard raised wart present  Neurological:  General: No focal deficit present.     Mental Status: He is alert and oriented to person, place, and time.     Cranial Nerves: No cranial nerve deficit.     Motor: No weakness.  Psychiatric:        Mood and Affect: Mood normal.        Behavior: Behavior normal.        Thought Content: Thought content normal.        Judgment: Judgment normal.        Assessment And Plan:     1. Essential hypertension Comments: Blood pressure is slightly elevated, I have encouraged him to increase his water intake.and be sure to take medications as directed - BMP8+eGFR  2. Class 1 obesity due to excess calories with serious comorbidity and body mass index (BMI) of 32.0 to 32.9 in adult Comments:  He is encouraged to initially strive for BMI less than 30 to decrease cardiac risk. He is advised to exercise no less than 150 minutes per week.   3. Encounter for immunization - Zoster Vaccine Adjuvanted [facility]) injection; Inject 0.5 mLs into the muscle once for 1 dose. Administer 2nd dose in 2-6 months  Please fax when each dose administered [phone]  Dispense: 0.5 mL; Refill: 1 - Tdap (BOOSTRIX) 5-2.5-18.5 LF-MCG/0.5 injection; Inject 0.5 mLs into the muscle once for 1 dose.  Dispense: 0.5 mL; Refill: 0  4. Polypharmacy - AMB Referral to [facility]  5. Viral warts, unspecified type Comments: left 4th phalange with wart present, unsuccessful with using over the counter solution.  Will refer to Dermatology  6.  Need for influenza vaccination - Flu Vaccine QUAD High Dose(Fluad)  7. Bilateral impacted cerumen Comments: Cleaned ears with lighted curette, large amount of hard cerumen removed from left ear and soft wax from right ear  8. Bilateral hearing loss, unspecified hearing loss type Comments: He would like to have his hearing evaluated - Ambulatory referral to ENT     Patient was given opportunity to ask questions. Patient verbalized understanding of the plan and was able to repeat key elements of the plan. All questions were answered to their satisfaction.  Minette Brine, FNP    I, Minette Brine, FNP, have reviewed all documentation for this visit. The documentation on 07/2121 for the exam, diagnosis, procedures, and orders are all accurate and complete.   IF YOU HAVE BEEN REFERRED TO A SPECIALIST, IT MAY TAKE 1-2 WEEKS TO SCHEDULE/PROCESS THE REFERRAL. IF YOU HAVE NOT HEARD FROM US/SPECIALIST IN TWO WEEKS, PLEASE GIVE [place] A CALL AT [phone] X [phone].   THE PATIENT IS ENCOURAGED TO PRACTICE SOCIAL DISTANCING DUE TO THE COVID-19 PANDEMIC.

## 2021-08-15 NOTE — Patient Instructions (Addendum)
Influenza Virus Vaccine injection What is this medication? INFLUENZA VIRUS VACCINE (in floo EN zuh VAHY ruhs vak SEEN) helps to reduce the risk of getting influenza also known as the flu. The vaccine only helps protect you against some strains of the flu. This medicine may be used for other purposes; ask your health care provider or pharmacist if you have questions. COMMON BRAND NAME(S): Afluria, Afluria Quadrivalent, Agriflu, Alfuria, FLUAD, FLUAD Quadrivalent, Fluarix, Fluarix Quadrivalent, Flublok, Flublok Quadrivalent, FLUCELVAX, FLUCELVAX Quadrivalent, Flulaval, Flulaval Quadrivalent, Fluvirin, Fluzone, Fluzone High-Dose, Fluzone Intradermal, Fluzone Quadrivalent What should I tell my care team before I take this medication? They need to know if you have any of these conditions: bleeding disorder like hemophilia fever or infection Guillain-Barre syndrome or other neurological problems immune system problems infection with the human immunodeficiency virus (HIV) or AIDS low blood platelet counts multiple sclerosis an unusual or allergic reaction to influenza virus vaccine, latex, other medicines, foods, dyes, or preservatives. Different brands of vaccines contain different allergens. Some may contain latex or eggs. Talk to your doctor about your allergies to make sure that you get the right vaccine. pregnant or trying to get pregnant breast-feeding How should I use this medication? This vaccine is for injection into a muscle or under the skin. It is given by a health care professional. A copy of Vaccine Information Statements will be given before each vaccination. Read this sheet carefully each time. The sheet may change frequently. Talk to your healthcare provider to see which vaccines are right for you. Some vaccines should not be used in all age groups. Overdosage: If you think you have taken too much of this medicine contact a poison control center or emergency room at once. NOTE: This  medicine is only for you. Do not share this medicine with others. What if I miss a dose? This does not apply. What may interact with this medication? chemotherapy or radiation therapy medicines that lower your immune system like etanercept, anakinra, infliximab, and adalimumab medicines that treat or prevent blood clots like warfarin phenytoin steroid medicines like prednisone or cortisone theophylline vaccines This list may not describe all possible interactions. Give your health care provider a list of all the medicines, herbs, non-prescription drugs, or dietary supplements you use. Also tell them if you smoke, drink alcohol, or use illegal drugs. Some items may interact with your medicine. What should I watch for while using this medication? Report any side effects that do not go away within 3 days to your doctor or health care professional. Call your health care provider if any unusual symptoms occur within 6 weeks of receiving this vaccine. You may still catch the flu, but the illness is not usually as bad. You cannot get the flu from the vaccine. The vaccine will not protect against colds or other illnesses that may cause fever. The vaccine is needed every year. What side effects may I notice from receiving this medication? Side effects that you should report to your doctor or health care professional as soon as possible: allergic reactions like skin rash, itching or hives, swelling of the face, lips, or tongue Side effects that usually do not require medical attention (report to your doctor or health care professional if they continue or are bothersome): fever headache muscle aches and pains pain, tenderness, redness, or swelling at the injection site tiredness Side effects that you should report to your doctor or health care professional as soon as possible: allergic reactions like skin rash, itching or hives, swelling of   the face, lips, or tongue Side effects that usually do not  require medical attention (report to your doctor or health care professional if they continue or are bothersome): fever headache muscle aches and pains pain, tenderness, redness, or swelling at the injection site tiredness This list may not describe all possible side effects. Call your doctor for medical advice about side effects. You may report side effects to FDA at 1-800-FDA-1088. Where should I keep my medication? The vaccine will be given by a health care professional in a clinic, pharmacy, doctor's office, or other health care setting. You will not be given vaccine doses to store at home. NOTE: This sheet is a summary. It may not cover all possible information. If you have questions about this medicine, talk to your doctor, pharmacist, or health care provider.  2022 Elsevier/Gold Standard (2020-07-18 19:49:22) Hypertension, Adult High blood pressure (hypertension) is when the force of blood pumping through the arteries is too strong. The arteries are the blood vessels that carry blood from the heart throughout the body. Hypertension forces the heart to work harder to pump blood and may cause arteries to become narrow or stiff. Untreated or uncontrolled hypertension can cause a heart attack, heart failure, a stroke, kidney disease, and other problems. A blood pressure reading consists of a higher number over a lower number. Ideally, your blood pressure should be below 120/80. The first ("top") number is called the systolic pressure. It is a measure of the pressure in your arteries as your heart beats. The second ("bottom") number is called the diastolic pressure. It is a measure of the pressure in your arteries as the heart relaxes. What are the causes? The exact cause of this condition is not known. There are some conditions that result in or are related to high blood pressure. What increases the risk? Some risk factors for high blood pressure are under your control. The following factors may  make you more likely to develop this condition: Smoking. Having type 2 diabetes mellitus, high cholesterol, or both. Not getting enough exercise or physical activity. Being overweight. Having too much fat, sugar, calories, or salt (sodium) in your diet. Drinking too much alcohol. Some risk factors for high blood pressure may be difficult or impossible to change. Some of these factors include: Having chronic kidney disease. Having a family history of high blood pressure. Age. Risk increases with age. Race. You may be at higher risk if you are African American. Gender. Men are at higher risk than women before age 12. After age 64, women are at higher risk than men. Having obstructive sleep apnea. Stress. What are the signs or symptoms? High blood pressure may not cause symptoms. Very high blood pressure (hypertensive crisis) may cause: Headache. Anxiety. Shortness of breath. Nosebleed. Nausea and vomiting. Vision changes. Severe chest pain. Seizures. How is this diagnosed? This condition is diagnosed by measuring your blood pressure while you are seated, with your arm resting on a flat surface, your legs uncrossed, and your feet flat on the floor. The cuff of the blood pressure monitor will be placed directly against the skin of your upper arm at the level of your heart. It should be measured at least twice using the same arm. Certain conditions can cause a difference in blood pressure between your right and left arms. Certain factors can cause blood pressure readings to be lower or higher than normal for a short period of time: When your blood pressure is higher when you are in a health  care provider's office than when you are at home, this is called white coat hypertension. Most people with this condition do not need medicines. When your blood pressure is higher at home than when you are in a health care provider's office, this is called masked hypertension. Most people with this  condition may need medicines to control blood pressure. If you have a high blood pressure reading during one visit or you have normal blood pressure with other risk factors, you may be asked to: Return on a different day to have your blood pressure checked again. Monitor your blood pressure at home for 1 week or longer. If you are diagnosed with hypertension, you may have other blood or imaging tests to help your health care provider understand your overall risk for other conditions. How is this treated? This condition is treated by making healthy lifestyle changes, such as eating healthy foods, exercising more, and reducing your alcohol intake. Your health care provider may prescribe medicine if lifestyle changes are not enough to get your blood pressure under control, and if: Your systolic blood pressure is above 130. Your diastolic blood pressure is above 80. Your personal target blood pressure may vary depending on your medical conditions, your age, and other factors. Follow these instructions at home: Eating and drinking  Eat a diet that is high in fiber and potassium, and low in sodium, added sugar, and fat. An example eating plan is called the DASH (Dietary Approaches to Stop Hypertension) diet. To eat this way: Eat plenty of fresh fruits and vegetables. Try to fill one half of your plate at each meal with fruits and vegetables. Eat whole grains, such as whole-wheat pasta, brown rice, or whole-grain bread. Fill about one fourth of your plate with whole grains. Eat or drink low-fat dairy products, such as skim milk or low-fat yogurt. Avoid fatty cuts of meat, processed or cured meats, and poultry with skin. Fill about one fourth of your plate with lean proteins, such as fish, chicken without skin, beans, eggs, or tofu. Avoid pre-made and processed foods. These tend to be higher in sodium, added sugar, and fat. Reduce your daily sodium intake. Most people with hypertension should eat less  than 1,500 mg of sodium a day. Do not drink alcohol if: Your health care provider tells you not to drink. You are pregnant, may be pregnant, or are planning to become pregnant. If you drink alcohol: Limit how much you use to: 0-1 drink a day for women. 0-2 drinks a day for men. Be aware of how much alcohol is in your drink. In the [place] one drink equals one 12 oz bottle of beer (355 mL), one 5 oz glass of wine (148 mL), or one 1 oz glass of hard liquor (44 mL). Lifestyle  Work with your health care provider to maintain a healthy body weight or to lose weight. Ask what an ideal weight is for you. Get at least 30 minutes of exercise most days of the week. Activities may include walking, swimming, or biking. Include exercise to strengthen your muscles (resistance exercise), such as Pilates or lifting weights, as part of your weekly exercise routine. Try to do these types of exercises for 30 minutes at least 3 days a week. Do not use any products that contain nicotine or tobacco, such as cigarettes, e-cigarettes, and chewing tobacco. If you need help quitting, ask your health care provider. Monitor your blood pressure at home as told by your health care provider. Keep all follow-up  visits as told by your health care provider. This is important. Medicines Take over-the-counter and prescription medicines only as told by your health care provider. Follow directions carefully. Blood pressure medicines must be taken as prescribed. Do not skip doses of blood pressure medicine. Doing this puts you at risk for problems and can make the medicine less effective. Ask your health care provider about side effects or reactions to medicines that you should watch for. Contact a health care provider if you: Think you are having a reaction to a medicine you are taking. Have headaches that keep coming back (recurring). Feel dizzy. Have swelling in your ankles. Have trouble with your vision. Get help right away  if you: Develop a severe headache or confusion. Have unusual weakness or numbness. Feel faint. Have severe pain in your chest or abdomen. Vomit repeatedly. Have trouble breathing. Summary Hypertension is when the force of blood pumping through your arteries is too strong. If this condition is not controlled, it may put you at risk for serious complications. Your personal target blood pressure may vary depending on your medical conditions, your age, and other factors. For most people, a normal blood pressure is less than 120/80. Hypertension is treated with lifestyle changes, medicines, or a combination of both. Lifestyle changes include losing weight, eating a healthy, low-sodium diet, exercising more, and limiting alcohol. This information is not intended to replace advice given to you by your health care provider. Make sure you discuss any questions you have with your health care provider. Document Revised: 07/22/2018 Document Reviewed: 07/22/2018 Elsevier Patient Education  2022 Else[employer] Inc.   Earwax Buildup, Adult The ears produce a substance called earwax that helps keep bacteria out of the ear and protects the skin in the ear canal. Occasionally, earwax can build up in the ear and cause discomfort or hearing loss. What are the causes? This condition is caused by a buildup of earwax. Ear canals are self-cleaning. Ear wax is made in the outer part of the ear canal and generally falls out in small amounts over time. When the self-cleaning mechanism is not working, earwax builds up and can cause decreased hearing and discomfort. Attempting to clean ears with cotton swabs can push the earwax deep into the ear canal and cause decreased hearing and pain. What increases the risk? This condition is more likely to develop in people who: Clean their ears often with cotton swabs. Pick at their ears. Use earplugs or in-ear headphones often, or wear hearing aids. The following factors may also  make you more likely to develop this condition: Being male. Being of older age. Naturally producing more earwax. Having narrow ear canals. Having earwax that is overly thick or sticky. Having excess hair in the ear canal. Having eczema. Being dehydrated. What are the signs or symptoms? Symptoms of this condition include: Reduced or muffled hearing. A feeling of fullness in the ear or feeling that the ear is plugged. Fluid coming from the ear. Ear pain or an itchy ear. Ringing in the ear. Coughing. Balance problems. An obvious piece of earwax that can be seen inside the ear canal. How is this diagnosed? This condition may be diagnosed based on: Your symptoms. Your medical history. An ear exam. During the exam, your health care provider will look into your ear with an instrument called an otoscope. You may have tests, including a hearing test. How is this treated? This condition may be treated by: Using ear drops to soften the earwax. Having the earwax  removed by a health care provider. The health care provider may: Flush the ear with water. Use an instrument that has a loop on the end (curette). Use a suction device. Having surgery to remove the wax buildup. This may be done in severe cases. Follow these instructions at home:  Take over-the-counter and prescription medicines only as told by your health care provider. Do not put any objects, including cotton swabs, into your ear. You can clean the opening of your ear canal with a washcloth or facial tissue. Follow instructions from your health care provider about cleaning your ears. Do not overclean your ears. Drink enough fluid to keep your urine pale yellow. This will help to thin the earwax. Keep all follow-up visits as told. If earwax builds up in your ears often or if you use hearing aids, consider seeing your health care provider for routine, preventive ear cleanings. Ask your health care provider how often you should  schedule your cleanings. If you have hearing aids, clean them according to instructions from the manufacturer and your health care provider. Contact a health care provider if: You have ear pain. You develop a fever. You have pus or other fluid coming from your ear. You have hearing loss. You have ringing in your ears that does not go away. You feel like the room is spinning (vertigo). Your symptoms do not improve with treatment. Get help right away if: You have bleeding from the affected ear. You have severe ear pain. Summary Earwax can build up in the ear and cause discomfort or hearing loss. The most common symptoms of this condition include reduced or muffled hearing, a feeling of fullness in the ear, or feeling that the ear is plugged. This condition may be diagnosed based on your symptoms, your medical history, and an ear exam. This condition may be treated by using ear drops to soften the earwax or by having the earwax removed by a health care provider. Do not put any objects, including cotton swabs, into your ear. You can clean the opening of your ear canal with a washcloth or facial tissue. This information is not intended to replace advice given to you by your health care provider. Make sure you discuss any questions you have with your health care provider. Document Revised: 02/29/2020 Document Reviewed: 02/29/2020 Elsevier Patient Education  2022 [employer].

## 2021-08-17 ENCOUNTER — Telehealth: Payer: Medicare HMO

## 2021-08-17 ENCOUNTER — Encounter: Payer: Self-pay | Admitting: Nurse Practitioner

## 2021-08-20 ENCOUNTER — Telehealth: Payer: Self-pay | Admitting: *Deleted

## 2021-08-20 NOTE — Chronic Care Management (AMB) (Signed)
  Chronic Care Lam   Note  08/20/2021 Name: Jesus Lam MRN: [id number] DOB: 03/06/1933  Jesus Lam is a 87 y.o. year old male who is a primary care patient of Jesus Felts, Jesus Lam. Jesus Lam is currently enrolled in care Lam services. An additional referral for Pharmacy  was placed.   Follow up plan: Telephone appointment with care Lam team member scheduled for:09/13/21  Jesus Lam, Jesus Care Coordination [facility]  Care [facility]  Jesus Lam: [phone]

## 2021-08-22 ENCOUNTER — Ambulatory Visit (INDEPENDENT_AMBULATORY_CARE_PROVIDER_SITE_OTHER): Payer: Medicare HMO

## 2021-08-22 ENCOUNTER — Telehealth: Payer: Medicare HMO

## 2021-08-22 DIAGNOSIS — E7800 Pure hypercholesterolemia, unspecified: Secondary | ICD-10-CM

## 2021-08-22 DIAGNOSIS — I10 Essential (primary) hypertension: Secondary | ICD-10-CM

## 2021-08-22 DIAGNOSIS — G8929 Other chronic pain: Secondary | ICD-10-CM

## 2021-08-22 DIAGNOSIS — E0590 Thyrotoxicosis, unspecified without thyrotoxic crisis or storm: Secondary | ICD-10-CM

## 2021-08-22 DIAGNOSIS — R413 Other amnesia: Secondary | ICD-10-CM

## 2021-08-22 DIAGNOSIS — M25562 Pain in left knee: Secondary | ICD-10-CM

## 2021-08-23 ENCOUNTER — Other Ambulatory Visit: Payer: Medicare HMO

## 2021-08-23 ENCOUNTER — Other Ambulatory Visit: Payer: Self-pay

## 2021-08-23 DIAGNOSIS — I10 Essential (primary) hypertension: Secondary | ICD-10-CM | POA: Diagnosis not present

## 2021-08-24 DIAGNOSIS — E7800 Pure hypercholesterolemia, unspecified: Secondary | ICD-10-CM

## 2021-08-24 DIAGNOSIS — I10 Essential (primary) hypertension: Secondary | ICD-10-CM

## 2021-08-24 LAB — BMP8+EGFR
BUN/Creatinine Ratio: 12 (ref 10–24)
BUN: 14 mg/dL (ref 8–27)
CO2: 22 mmol/L (ref 20–29)
Calcium: 9.4 mg/dL (ref 8.6–10.2)
Chloride: 109 mmol/L — ABNORMAL HIGH (ref 96–106)
Creatinine, Ser: 1.19 mg/dL (ref 0.76–1.27)
Glucose: 93 mg/dL (ref 70–99)
Potassium: 4.7 mmol/L (ref 3.5–5.2)
Sodium: 145 mmol/L — ABNORMAL HIGH (ref 134–144)
eGFR: 59 mL/min/{1.73_m2} — ABNORMAL LOW (ref >59)

## 2021-08-28 NOTE — Chronic Care Management (AMB) (Signed)
Chronic Care Management   CCM RN Visit Note  08/22/2021 Name: Jesus Lam MRN: [id number] DOB: 10/26/1933  Subjective: Jesus Lam is a 87 y.o. year old male who is a primary care patient of [name], FNP. The care management team was consulted for assistance with disease management and care coordination needs.    Engaged with patient by telephone for follow up visit in response to provider referral for case management and/or care coordination services.   Consent to Services:  The patient was given information about Chronic Care Management services, agreed to services, and gave verbal consent prior to initiation of services.  Please see initial visit note for detailed documentation.   Patient agreed to services and verbal consent obtained.   Assessment: Review of patient past medical history, allergies, medications, health status, including review of consultants reports, laboratory and other test data, was performed as part of comprehensive evaluation and provision of chronic care management services.   SDOH (Social Determinants of Health) assessments and interventions performed:  Yes, no acute needs   CCM Care Plan  Allergies  Allergen Reactions   Shellfish Allergy Other (See Comments)    Gout     Outpatient Encounter Medications as of 08/22/2021  Medication Sig   amLODipine (NORVASC) 5 MG tablet TAKE ONE TABLET BY MOUTH DAILY   atorvastatin (LIPITOR) 80 MG tablet TAKE 1 TABLET ONE TIME DAILY   azelastine (ASTELIN) 0.1 % nasal spray Place 2 sprays into both nostrils 2 (two) times daily. Use in each nostril as directed   bisoprolol (ZEBETA) 5 MG tablet TAKE HALF TABLET BY MOUTH DAILY   chlorthalidone (HYGROTON) 25 MG tablet Take 1 tablet (25 mg total) by mouth daily.   Cholecalciferol (VITAMIN D3) 5000 units CAPS Take 5,000 Units by mouth daily.   clopidogrel (PLAVIX) 75 MG tablet TAKE ONE TABLET BY MOUTH DAILY   diclofenac Sodium (VOLTAREN) 1 % GEL APPLY TWO GRAMS  TOPICALLY FOUR TIMES A DAY   donepezil (ARICEPT) 10 MG tablet Take 1 tablet (10 mg total) by mouth at bedtime.   Elastic Bandages & Supports (MEDICAL COMPRESSION STOCKINGS) MISC    furosemide (LASIX) 40 MG tablet Take 1 tablet (40 mg total) by mouth daily.   gabapentin (NEURONTIN) 300 MG capsule TAKE ONE CAPSULE BY MOUTH AT BEDTIME   hydrALAZINE (APRESOLINE) 25 MG tablet TAKE ONE TABLET BY MOUTH TWICE A DAY   icosapent Ethyl (VASCEPA) 1 g capsule TAKE ONE CAPSULE BY MOUTH TWICE A DAY   isosorbide mononitrate (IMDUR) 30 MG 24 hr tablet TAKE ONE TABLET BY MOUTH TWICE A DAY   ketorolac (ACULAR) 0.5 % ophthalmic solution ketorolac 0.5 % eye drops   levocetirizine (XYZAL) 5 MG tablet Take 1 tablet (5 mg total) by mouth every evening.   Menthol-Methyl Salicylate (MUSCLE RUB) 10-15 % CREA muscle rub   methimazole (TAPAZOLE) 10 MG tablet TAKE ONE TABLET BY MOUTH DAILY   Multiple Vitamins-Minerals (COMPLETE SENIOR PO) 1 tablet daily.    tamsulosin (FLOMAX) 0.4 MG CAPS capsule TAKE ONE CAPSULE BY MOUTH DAILY HALF HOUR FOLLOWING THE SAME MEAL DAILY   vitamin B-12 (CYANOCOBALAMIN) 500 MCG tablet Take 500 mcg by mouth daily.   vitamin C (ASCORBIC ACID) 500 MG tablet Take 500 mg daily by mouth.   Facility-Administered Encounter Medications as of 08/22/2021  Medication   0.9 %  sodium chloride infusion    Patient Active Problem List   Diagnosis Date Noted   Leg edema 08/01/2021   Mild cognitive impairment 09/07/2020  Chronic pain of left knee 03/15/2020   Pure hypercholesterolemia 11/15/2019   First degree AV block 07/28/2019   Hypertriglyceridemia 04/29/2019   Mixed hyperlipidemia 04/29/2019   CVA (cerebral vascular accident) (HCC)    Bradycardia    Subclinical hyperthyroidism 11/18/2018   Peripheral artery disease (HCC) 06/13/2018   Ischemic cerebrovascular accident (CVA) (HCC) 06/13/2018   Dysarthria 06/12/2018   Renal insufficiency 06/12/2018   Claudication (HCC) 01/11/2018   Anemia  10/10/2017   Essential hypertension 11/04/2013    Conditions to be addressed/monitored: HTN, Hyperthyroidism, Altered memory,  Bilateral hand pain, Medication management, Pure hypercholesterolemia, Chronic pain of left knee   Care Plan : Dementia (Adult)  Updates made by Riley Churches, RN since 08/22/2021 12:00 AM     Problem: Long-Term Care Planning   Priority: High     Long-Range Goal: Effective Long-Term Care Planning   Start Date: 02/20/2021  Expected End Date: 08/23/2021  Recent Progress: On track  Priority: High  Note:   Current Barriers:  Ineffective Self Health Maintenance Memory deficits  Clinical Goal(s):  Collaboration with [name], FNP regarding development and update of comprehensive plan of care as evidenced by provider attestation and co-signature Inter-disciplinary care team collaboration (see longitudinal plan of care) patient will work with care management team to address care coordination and chronic disease management needs related to Disease Management Educational Needs Care Coordination Medication Management and Education Psychosocial Support   Interventions:  08/22/21 completed successful outbound call with patient  Evaluation of current treatment plan related to  memory deficit  , self-management and patient's adherence to plan as established by provider. Collaboration with [name], FNP regarding development and update of comprehensive plan of care as evidenced by provider attestation       and co-signature Inter-disciplinary care team collaboration (see longitudinal plan of care) Provided education to patient about basic disease process for dementia  Review of patient status, including review of consultants reports, relevant laboratory and other test results, and medications completed. Reviewed medications with patient and discussed importance of medication adherence Educated on use of calendar for appointments and reminders of important  events  Determined patient feels his memory is stable, reports no safety concerns related to change in memory  Educated patient on importance of staying active and engaged socially, patient is participating in routine exercise regimen 5-7 days weekly  Discussed plans with patient for ongoing care management follow up and provided patient with direct contact information for care management team Self Care Activities:  Continue to keep all scheduled follow up appointments Take medications as directed  Let your healthcare team know if you are unable to take your medications Call your pharmacy for refills at least 7 days prior to running out of medication Patient Goals: - use calendar for appointment reminders  - stay active and engaged socially   Follow Up Plan: Telephone follow up appointment with care management team member scheduled for: 11/22/21     Care Plan : Hypertriglyceridemia  Updates made by Riley Churches, RN since 08/22/2021 12:00 AM     Problem: Hypertriglyceridemia   Priority: Medium     Long-Range Goal: Hypertriglyceridemia - treatment optimized   Start Date: 02/20/2021  Expected End Date: 02/20/2022  Recent Progress: On track  Priority: Medium  Note:   Current Barriers:  Ineffective Self Health Maintenance Memory deficits  Clinical Goal(s):  Collaboration with [name], FNP regarding development and update of comprehensive plan of care as evidenced by provider attestation and co-signature Inter-disciplinary care  team collaboration (see longitudinal plan of care) patient will work with care management team to address care coordination and chronic disease management needs related to Disease Management Educational Needs Care Coordination Medication Management and Education Psychosocial Support   Interventions:  04/20/21 completed successful outbound call with patient  Evaluation of current treatment plan related to  elevated triglycerides , self-management and  patient's adherence to plan as established by provider. Collaboration with [name], FNP regarding development and update of comprehensive plan of care as evidenced by provider attestation       and co-signature Inter-disciplinary care team collaboration (see longitudinal plan of care) Provided education to patient about basic disease process related to elevated triglycerides  Determined patient is adhering to dietary and exercise recommendations as directed Review of patient status, including review of consultants reports, relevant laboratory and other test results, and medications completed. Reviewed medications with patient and discussed importance of medication adherence Confirmed patient received and reviewed the printed educational materials related to lowering Cholesterol and Triglycerides  Discussed plans with patient for ongoing care management follow up and provided patient with direct contact information for care management team Self Care Activities:  Continue to keep all scheduled follow up appointments Take medications as directed  Let your healthcare team know if you are unable to take your medications Call your pharmacy for refills at least 7 days prior to running out of medication Patient Goals: - continue to adhere to dietary and exercise recommendations  - review patient educational materials related to elevated Triglycerides    Follow Up Plan:  Telephone follow up appointment with care management team member scheduled for: 11/22/21    Care Plan : Rheumatoid Arthritis /Osteoarthritis (Adult)  Updates made by Riley Churches, RN since 08/22/2021 12:00 AM     Problem: Mobility and Function (Osteoarthritis)   Priority: High     Long-Range Goal: Maintain Mobility and Function   Start Date: 02/20/2021  Expected End Date: 02/20/2022  Recent Progress: On track  Priority: High  Note:   Current Barriers:  Ineffective Self Health Maintenance Memory deficits  Clinical  Goal(s):  Collaboration with [name], FNP regarding development and update of comprehensive plan of care as evidenced by provider attestation and co-signature Inter-disciplinary care team collaboration (see longitudinal plan of care) patient will work with care management team to address care coordination and chronic disease management needs related to Disease Management Educational Needs Care Coordination Medication Management and Education Psychosocial Support   Interventions:  08/22/21 completed successful outbound call with patient  Evaluation of current treatment plan related to Chronic pain of left knee, self-management and patient's adherence to plan as established by provider. Collaboration with [name], FNP regarding development and update of comprehensive plan of care as evidenced by provider attestation       and co-signature Inter-disciplinary care team collaboration (see longitudinal plan of care) Provided education to patient about basic Arthritis disease process Review of patient status, including review of consultants reports, relevant laboratory and other test results, and medications completed. Reviewed medications with patient and discussed importance of medication adherence Reviewed and discussed Rheumatology follow up completed on 05/16/21 with Dr. Corliss Skains with the following Assessment/Plan reviewed:   Assessment / Plan:     Visit Diagnoses: Primary osteoarthritis of both hands - Clinical and radiographic findings are consistent with osteoarthritis.  Joint protection muscle strengthening was discussed.  A handout on hand exercises was given.  I advised him to contact me if develops any increased swelling.  Rheumatoid factor positive - Low titer rheumatoid factor was negative.  He had no synovitis on examination.  We discussed obtaining ultrasound in case he develops swelling.  He had no synovitis on my examination.   Primary osteoarthritis of left knee -  End-stage severe osteoarthritis and severe patellofemoral narrowing was noted on the x-ray.  He was given a handout on exercises.  We will need total knee replacement.  Patient states he has an orthopedic surgeon and he will schedule appointment.  Have given her a handout on lower extremity muscle strengthening exercises.   Primary osteoarthritis of both feet-proper fitting shoes were discussed.  Other medical problems are listed as follows:   Essential hypertension   First degree AV block   Ischemic cerebrovascular accident (CVA) (HCC)   Pure hypercholesterolemia   Peripheral artery disease (HCC)   Claudication (HCC)   Renal insufficiency   Subclinical hyperthyroidism   Orders: No orders of the defined types were placed in this encounter.   No orders of the defined types were placed in this encounter.   Follow-Up Instructions: Return if symptoms worsen or fail to improve, for Osteoarthritis. Determined patient has a good understanding of his prescribed treatment plan per Dr. Corliss Skains Discussed plans with patient for ongoing care management follow up and provided patient with direct contact information for care management team Self Care Activities:   Continue to keep all scheduled follow up appointments Take medications as directed  Let your healthcare team know if you are unable to take your medications Call your pharmacy for refills at least 7 days prior to running out of medication Patient Goals: -continue to follow the knee exercises provided by Dr. Corliss Skains  -return to Dr. Corliss Skains if symptoms worsen or fall to improve, for Osteoarthritis as directed   Follow Up Plan: Telephone follow up appointment with care management team member scheduled for: 11/22/21     Care Plan : COVID 19 infection  Updates made by Riley Churches, RN since 08/28/2021 12:00 AM  Completed 08/28/2021   Problem: COVID 19 infection Resolved 08/22/2021  Priority: High     Goal: COVID 19  infection - complications minimized or prevented Completed 08/22/2021  Start Date: 05/09/2021  Expected End Date: 06/22/2021  Recent Progress: On track  Priority: High  Note:   Current Barriers:  Ineffective Self Health Maintenance  Clinical Goal(s):  Collaboration with [name], FNP regarding development and update of comprehensive plan of care as evidenced by provider attestation and co-signature Inter-disciplinary care team collaboration (see longitudinal plan of care) patient will work with care management team to address care coordination and chronic disease management needs related to Disease Management Educational Needs Care Coordination Medication Management and Education Psychosocial Support   Interventions:  08/22/21 completed successful outbound call with patient  Evaluation of current treatment plan related to  COVID 19 infection  , self-management and patient's adherence to plan as established by provider. Collaboration with [name], FNP regarding development and update of comprehensive plan of care as evidenced by provider attestation       and co-signature Inter-disciplinary care team collaboration (see longitudinal plan of care) Determined patient has made a full recovery from COVID 19 without long haul effects Self Care Activities:  Self administers medications as prescribed Attends all scheduled provider appointments Calls pharmacy for medication refills Calls provider office for new concerns or questions Patient Goals:  follow MD recommendations for post COVID 19    Care Plan : Chronic Kidney (Adult)  Updates made by  Tauni Sanks, Karma Lew, RN since 08/22/2021 12:00 AM     Problem: Chronic Kidney disease   Priority: High     Long-Range Goal: Chronic Kidney disease progression prevented or minimized   Start Date: 08/22/2021  Expected End Date: 08/22/2022  This Visit's Progress: On track  Note:   Current Barriers:  Ineffective Self Health Maintenance in a  patient with HTN, Hyperthyroidism, Altered memory,  Bilateral hand pain, Medication management, Pure hypercholesterolemia, Chronic pain of left knee  Clinical Goal(s):  Collaboration with [name], FNP regarding development and update of comprehensive plan of care as evidenced by provider attestation and co-signature Inter-disciplinary care team collaboration (see longitudinal plan of care) patient will work with care management team to address care coordination and chronic disease management needs related to Disease Management Educational Needs Care Coordination Medication Management and Education Medication Reconciliation Psychosocial Support   Interventions:  08/22/21 completed successful outbound call with patient  Evaluation of current treatment plan related to CKD Stage IIIa ,  self-management and patient's adherence to plan as established by provider. Collaboration with [name], FNP regarding development and update of comprehensive plan of care as evidenced by provider attestation       and co-signature Inter-disciplinary care team collaboration (see longitudinal plan of care) Provided education to patient about basic disease process related to Chronic Kidney disease  Review of patient status, including review of consultant's reports, relevant laboratory and other test results, and medications completed. Reviewed medications with patient and discussed importance of medication adherence Educated patient on importance to increase water intake to 64 oz daily Educated on how to track and manage symptoms secondary to stage 3 Chronic Kidney disease Mailed printed educational materials related to 6 Ways to be Water Wise; Eating Right with Chronic Kidney disease; Stages of Chronic Kidney disease Discussed plans with patient for ongoing care management follow up and provided patient with direct contact information for care management team Self Care Activities:  Self administers  medications as prescribed Attends all scheduled provider appointments Calls pharmacy for medication refills Calls provider office for new concerns or questions Patient Goals: - increase water intake to 64 oz daily - learn to track and manage symptoms related to chronic kidney disease  Follow Up Plan: Telephone follow up appointment with care management team member scheduled for: 11/22/21     Plan:Telephone follow up appointment with care management team member scheduled for:  11/22/21  [name], RN, BSN, CCM Care Management Coordinator [facility]  Direct Phone: [phone]

## 2021-08-28 NOTE — Patient Instructions (Signed)
Visit Information  PATIENT GOALS:  Goals Addressed      Chronic Kidney disease progression prevented or minimized   On track    Timeframe:  Long-Range Goal Priority:  High Start Date: 08/22/21                            Expected End Date: 08/22/22     Follow up date: 11/22/21            Patient Goals: - increase water intake to 64 oz daily - learn to track and manage symptoms related to chronic kidney disease           COMPLETED: COVID 19 infection - complications minimized or prevented       Timeframe:  Short-Term Goal Priority:  High Start Date:  05/09/21                           Expected End Date: 06/22/21  Next Scheduled Follow up date: 06/20/21        Self Care Activities:  Self administers medications as prescribed Attends all scheduled provider appointments Calls pharmacy for medication refills Calls provider office for new concerns or questions Patient Goals:  follow MD recommendations for post COVID 19                     Effective Long Term Care Planning   On track    Timeframe:  Long-Range Goal Priority:  High Start Date: 02/20/21                             Expected End Date: 02/20/22   Follow up date: 11/22/21  Patient Goals: - use calendar for appointment reminders  - stay active and engaged socially                            Hypertriglyceridemia - treatment optimized       Timeframe:  Long-Range Goal Priority:  Medium Start Date:  02/20/21                           Expected End Date:  02/20/22      Follow Up date: 11/22/21  Patient Goals: - lower Triglycerides and increase HDL - review patient educational materials related to elevated Triglycerides                       Maintain Mobility and Function   On track    Timeframe:  Long-Range Goal Priority:  High Start Date:  02/20/21                           Expected End Date: 02/20/22    Follow Up date: 11/22/21  Patient Goals:  -continue to follow the knee exercises provided by Dr. Corliss Skains   -return to Dr. Corliss Skains if symptoms worsen or fall to improve, for Osteoarthritis as directed                   The patient verbalized understanding of instructions, educational materials, and care plan provided today and declined offer to receive copy of patient instructions, educational materials, and care plan.   Telephone follow up appointment with care management team member scheduled for: 11/22/21  Barb Merino, RN, BSN, CCM Care Management Coordinator [place] [facility]  Direct Phone: [phone]

## 2021-08-29 DIAGNOSIS — I10 Essential (primary) hypertension: Secondary | ICD-10-CM | POA: Diagnosis not present

## 2021-09-06 ENCOUNTER — Telehealth: Payer: Self-pay

## 2021-09-06 NOTE — Chronic Care Management (AMB) (Signed)
Chronic Care Management Pharmacy Assistant   Name: Jesus Lam  MRN: [id number] DOB: Mar 30, 1933   Reason for Encounter: Chart review for CPP visit on 09-13-2021   Conditions to be addressed/monitored: HTN, HLD, and Hypertriglyceridemia   Recent office visits:  08-22-2021 Lynne Logan, RN (CCM)  08-15-2021 Minette Brine, [place]. eGFR= 59, Sodium= 145, Chloride= 109. Referral placed to ENT. START azelastine nasal spray 2 sprays in both nostrils twice daily. Tdap and shingrix given.  05-15-2021 Minette Brine, [place]. START Xyzal 5 mg nightly. Prevnar, TDAP and shingrix given.  05-09-2021 Little, Claudette Stapler, RN (CCM)  05-02-2021 Bary Castilla, NP. Televisit for covid symptoms. Orders placed for monoclonal antibody treatment. The patient was instructed to stay well hydrated. The patient was instructed to go to the emergency room if he experiences any shortness of breath or chest pain. Advised patient to take Vitamin C, D, Zinc. Keep yourself hydrated with a lot of water and rest. Take Delsym for cough and Mucinex. Take Tylenol or pain reliever every 4-6 hours as needed for pain/fever/body ache. Educated patient if symptoms get worse or if he experiences any SOB or pain in her legs to seek immediate emergency care. Continue to monitor pulse oxygen. Quarantine for 5 days if tested positive and no symptoms or 10 days if tested positive and have symptoms. Wear a mask around other people.   04-20-2021 Little, Claudette Stapler, RN (CCM)   Recent consult visits:  08-01-2021 Nigel Mormon, MD (Cardiology). Increased Lasix 20 mg daily TO 40 mg daily.  07-30-2021 Nigel Mormon, MD (Cardiology). Unable to view encounter.  07-24-2021 Adrian Prows, MD (Cardiology). Unable to view encounter.  07-05-2021 Nigel Mormon, MD (Cardiology). EKG and PCV lower arterial (bilateral) ordered. INCREASE Atorvastatin 40 mg daily TO 80 mg daily.  06-29-2021 Nigel Mormon, MD (Cardiology).  Unable to view encounter.  05-24-2021 Nigel Mormon, MD (Cardiology). Unable to view encounter.  05-16-2021 Bo Merino, MD (Rheumatology). Knee exercises provided.  05-09-2021 Rodriguez-Southworth, Sunday Spillers, MD (Internal Medicine). Unable to view encounter.  05-04-2021 Koren Shiver, RN. Positive covid test. START Albuteral inhaler 2 puffs as needed. Benadryl injection given. Sodium chloride infusion and solu-medrol given. Bebtelovimab injection given.  04-25-2021 Nigel Mormon, MD (Cardiology). Unable to view encounter.  04-20-2021 Shelby Mattocks, MD (Internal Medicine). Unable to view encounter.  04-17-2021 Bo Merino, MD (Rheumatology). STOP isosorbide dinitrate and fish oil. XR of both feet, both hands and left knee.  03-27-2021 Rod Can, MD (Orthopedic surgery). Unable to view encounter  03-26-2021 Nigel Mormon, MD (Cardiology). Unable to view encounter.  Hospital visits:  None in previous 6 months  Medications: Outpatient Encounter Medications as of 09/06/2021  Medication Sig   amLODipine (NORVASC) 5 MG tablet TAKE ONE TABLET BY MOUTH DAILY   atorvastatin (LIPITOR) 80 MG tablet TAKE 1 TABLET ONE TIME DAILY   azelastine (ASTELIN) 0.1 % nasal spray Place 2 sprays into both nostrils 2 (two) times daily. Use in each nostril as directed   bisoprolol (ZEBETA) 5 MG tablet TAKE HALF TABLET BY MOUTH DAILY   chlorthalidone (HYGROTON) 25 MG tablet Take 1 tablet (25 mg total) by mouth daily.   Cholecalciferol (VITAMIN D3) 5000 units CAPS Take 5,000 Units by mouth daily.   clopidogrel (PLAVIX) 75 MG tablet TAKE ONE TABLET BY MOUTH DAILY   diclofenac Sodium (VOLTAREN) 1 % GEL APPLY TWO GRAMS TOPICALLY FOUR TIMES A DAY   donepezil (ARICEPT) 10 MG tablet Take 1 tablet (10 mg total) by mouth  at bedtime.   Elastic Bandages & Supports (MEDICAL COMPRESSION STOCKINGS) MISC    furosemide (LASIX) 40 MG tablet Take 1 tablet (40 mg total) by  mouth daily.   gabapentin (NEURONTIN) 300 MG capsule TAKE ONE CAPSULE BY MOUTH AT BEDTIME   hydrALAZINE (APRESOLINE) 25 MG tablet TAKE ONE TABLET BY MOUTH TWICE A DAY   icosapent Ethyl (VASCEPA) 1 g capsule TAKE ONE CAPSULE BY MOUTH TWICE A DAY   isosorbide mononitrate (IMDUR) 30 MG 24 hr tablet TAKE ONE TABLET BY MOUTH TWICE A DAY   ketorolac (ACULAR) 0.5 % ophthalmic solution ketorolac 0.5 % eye drops   levocetirizine (XYZAL) 5 MG tablet Take 1 tablet (5 mg total) by mouth every evening.   Menthol-Methyl Salicylate (MUSCLE RUB) 10-15 % CREA muscle rub   methimazole (TAPAZOLE) 10 MG tablet TAKE ONE TABLET BY MOUTH DAILY   Multiple Vitamins-Minerals (COMPLETE SENIOR PO) 1 tablet daily.    tamsulosin (FLOMAX) 0.4 MG CAPS capsule TAKE ONE CAPSULE BY MOUTH DAILY HALF HOUR FOLLOWING THE SAME MEAL DAILY   vitamin B-12 (CYANOCOBALAMIN) 500 MCG tablet Take 500 mcg by mouth daily.   vitamin C (ASCORBIC ACID) 500 MG tablet Take 500 mg daily by mouth.   Facility-Administered Encounter Medications as of 09/06/2021  Medication   0.9 %  sodium chloride infusion   Have you seen any other providers since your last visit? Patient stated no  Any changes in your medications or health? Patient stated no  Any side effects from any medications? Patient stated no  Do you have an symptoms or problems not managed by your medications? Patient stated no  Any concerns about your health right now? Patient stated he is sometimes forgetful.  Has your provider asked that you check blood pressure, blood sugar, or follow special diet at home? Patient states he checks blood pressure  every morning.  Do you get any type of exercise on a regular basis? Patient states he swims 3 times weekly, walks and stays active.  Can you think of a goal you would like to reach for your health? Patient states he just wants to stay healthy.  Do you have any problems getting your medications? no Is there anything that you would like  to discuss during the appointment? Patient stated no  Please bring medications and supplements to appointment   Care Gaps: Covid booster overdue last completed 05-04-2021  AWV 02-14-2022  Star Rating Drugs: Atorvastatin 80 mg- Last filled 08-09-2021 60 DS [place] Clinical Pharmacist Assistant [phone]

## 2021-09-07 ENCOUNTER — Other Ambulatory Visit: Payer: Self-pay | Admitting: Nurse Practitioner

## 2021-09-13 ENCOUNTER — Ambulatory Visit (INDEPENDENT_AMBULATORY_CARE_PROVIDER_SITE_OTHER): Payer: Medicare HMO

## 2021-09-13 DIAGNOSIS — I10 Essential (primary) hypertension: Secondary | ICD-10-CM

## 2021-09-13 DIAGNOSIS — R413 Other amnesia: Secondary | ICD-10-CM

## 2021-09-13 DIAGNOSIS — E7800 Pure hypercholesterolemia, unspecified: Secondary | ICD-10-CM

## 2021-09-13 NOTE — Progress Notes (Signed)
Chronic Care Management Pharmacy Note  09/20/2021 Name:  Jesus Lam MRN:  [id number] DOB:  August 15, 1933  Summary: Patient reports that he is doing okay but his BP readings at home have gone up   Recommendations/Changes made from today's visit: Recommend patient start checking his BP daily and log the readings in a book. Recommend he check his BP at the same time each day. Recommended patient receive COVID-19 booster vaccine and Pneumonia vaccine.   Plan: Patient is going to check his BP at the same time each day . Will be scheduled for covid-19 bivalent booster on Monday 09/17/2021 when his daughter is available to confirm time for transportation.  Patient to get pneumonia vaccine.    Subjective: Jesus Lam is an 87 y.o. year old male who is a primary patient of Minette Brine, [place].  The CCM team was consulted for assistance with disease management and care coordination needs.    Engaged with patient by telephone for initial visit in response to provider referral for pharmacy case management and/or care coordination services. Patient reports that he is originally from [place], [place]. He has been here in [place] for 12 years. He has two boys and two girls, and one passed away. There are four living total. He is living with his daughter [name]. He reports that he loves his children but sometimes things become complicated. He is retired and he was a Education officer, museum. He reports that his daughters are traveling right now, and he is by himself.   Consent to Services:  The patient was given the following information about Chronic Care Management services today, agreed to services, and gave verbal consent: 1. CCM service includes personalized support from designated clinical staff supervised by the primary care provider, including individualized plan of care and coordination with other care providers 2. 24/7 contact phone numbers for assistance for urgent and routine care needs. 3. Service will  only be billed when office clinical staff spend 20 minutes or more in a month to coordinate care. 4. Only one practitioner may furnish and bill the service in a calendar month. 5.The patient may stop CCM services at any time (effective at the end of the month) by phone call to the office staff. 6. The patient will be responsible for cost sharing (co-pay) of up to 20% of the service fee (after annual deductible is met). Patient agreed to services and consent obtained.  Patient Care Team: Minette Brine, FNP as PCP - General (General Practice) Rex Kras Claudette Stapler, RN as Case Manager Mayford Knife, [facility] (Pharmacist)  Recent office visits: 08/15/2021 PCP OV   Recent consult visits: 08/01/2021 Cardiology [facility] visits: None in previous 6 months   Objective:  Lab Results  Component Value Date   CREATININE 1.19 08/23/2021   BUN 14 08/23/2021   GFRNONAA 55 (L) 08/03/2020   GFRAA 64 08/03/2020   NA 145 (H) 08/23/2021   K 4.7 08/23/2021   CALCIUM 9.4 08/23/2021   CO2 22 08/23/2021   GLUCOSE 93 08/23/2021    Lab Results  Component Value Date/Time   HGBA1C 5.1 02/03/2020 03:37 PM   HGBA1C 5.3 06/13/2018 10:18 AM   MICROALBUR 10 02/08/2021 03:00 PM   MICROALBUR 30 02/03/2020 03:24 PM    Last diabetic Eye exam: No results found for: HMDIABEYEEXA  Last diabetic Foot exam: No results found for: HMDIABFOOTEX   Lab Results  Component Value Date   CHOL 124 02/08/2021   HDL 32 (L) 02/08/2021   LDLCALC 59  02/08/2021   TRIG 203 (H) 02/08/2021   CHOLHDL 3.9 02/08/2021    Hepatic Function Latest Ref Rng & Units 02/08/2021 08/03/2020 04/27/2020  Total Protein 6.0 - 8.5 g/dL 7.4 7.4 7.1  Albumin 3.6 - 4.6 g/dL 4.0 4.2 3.9  AST 0 - 40 IU/L 21 17 21   ALT 0 - 44 IU/L 17 13 13   Alk Phosphatase 44 - 121 IU/L 93 109 86  Total Bilirubin 0.0 - 1.2 mg/dL 0.5 0.4 0.7    Lab Results  Component Value Date/Time   TSH 1.110 02/08/2021 03:29 PM   TSH 3.740 08/03/2020 03:06 PM   FREET4 1.13  01/28/2019 04:06 PM   FREET4 1.00 12/14/2018 01:38 PM    CBC Latest Ref Rng & Units 01/28/2019 11/17/2018 10/29/2018  WBC 3.4 - 10.8 x10E3/uL 6.2 6.0 4.8  Hemoglobin 13.0 - 17.7 g/dL 11.7(L) 12.1(L) 11.7(L)  Hematocrit 37.5 - 51.0 % 36.4(L) 38.7(L) 35.7(L)  Platelets 150 - 450 x10E3/uL 194 155 171    No results found for: VD25OH  Clinical ASCVD: Yes  The ASCVD Risk score (Arnett DK, et al., 2019) failed to calculate for the following reasons:   The 2019 ASCVD risk score is only valid for ages 2 to 19   The patient has a prior MI or stroke diagnosis    Depression screen [facility] 2/9 02/08/2021 02/03/2020 02/03/2020  Decreased Interest 0 0 0  Down, Depressed, Hopeless 3 0 0  PHQ - 2 Score 3 0 0  Altered sleeping 0 0 -  Tired, decreased energy 3 0 -  Change in appetite 0 0 -  Feeling bad or failure about yourself  0 0 -  Trouble concentrating 3 0 -  Moving slowly or fidgety/restless 0 0 -  Suicidal thoughts 0 0 -  PHQ-9 Score 9 0 -  Difficult doing work/chores Somewhat difficult Not difficult at all -      Social History   Tobacco Use  Smoking Status Former   Types: Cigarettes  Smokeless Tobacco Never   BP Readings from Last 3 Encounters:  08/15/21 (!) 166/74  08/01/21 (!) 159/70  07/05/21 133/74   Pulse Readings from Last 3 Encounters:  08/15/21 (!) 56  08/01/21 64  07/05/21 60   Wt Readings from Last 3 Encounters:  08/15/21 239 lb (108.4 kg)  08/01/21 236 lb (107 kg)  07/05/21 240 lb (108.9 kg)   BMI Readings from Last 3 Encounters:  08/15/21 32.41 kg/m  08/01/21 32.01 kg/m  07/05/21 33.47 kg/m    Assessment/Interventions: Review of patient past medical history, allergies, medications, health status, including review of consultants reports, laboratory and other test data, was performed as part of comprehensive evaluation and provision of chronic care management services.   SDOH:  (Social Determinants of Health) assessments and interventions performed:  Yes  SDOH Screenings   Alcohol Screen: Not on file  Depression (PHQ2-9): Medium Risk   PHQ-2 Score: 9  Financial Resource Strain: Low Risk    Difficulty of Paying Living Expenses: Not hard at all  Food Insecurity: No Food Insecurity   Worried About Charity fundraiser in the Last Year: Never true   Ran Out of Food in the Last Year: Never true  Housing: Not on file  Physical Activity: Insufficiently Active   Days of Exercise per Week: 3 days   Minutes of Exercise per Session: 40 min  Social Connections: Not on file  Stress: Stress Concern Present   Feeling of Stress : To some extent  Tobacco  Use: Medium Risk   Smoking Tobacco Use: Former   Smokeless Tobacco Use: Never   Passive Exposure: Not on file  Transportation Needs: No Transportation Needs   Lack of Transportation (Medical): No   Lack of Transportation (Non-Medical): No    CCM Care Plan  Allergies  Allergen Reactions   Shellfish Allergy Other (See Comments)    Gout     Medications Reviewed Today     Reviewed by Mayford Knife, RPH (Pharmacist) on 09/13/21 at 1006  Med List Status: <None>   Medication Order Taking? Sig Documenting Provider Last Dose Status Informant  0.9 %  sodium chloride infusion [id number]   Ghumman, Ramandeep, NP  Active   amLODipine (NORVASC) 5 MG tablet [id number] Yes TAKE ONE TABLET BY MOUTH DAILY Patwardhan, Manish J, MD Taking Active   atorvastatin (LIPITOR) 80 MG tablet [id number] Yes TAKE 1 TABLET ONE TIME DAILY [place] Chard, MD Taking Active   azelastine (ASTELIN) 0.1 % nasal spray [id number] Yes Place 2 sprays into both nostrils 2 (two) times daily. Use in each nostril as directed Minette Brine, FNP Taking Active   bisoprolol (ZEBETA) 5 MG tablet [id number] Yes TAKE HALF TABLET BY MOUTH DAILY Patwardhan, Manish J, MD Taking Active   chlorthalidone (HYGROTON) 25 MG tablet [id number] Yes Take 1 tablet (25 mg total) by mouth daily. [place] Chard, MD Taking Active   Cholecalciferol (VITAMIN  D3) 5000 units CAPS [id number] Yes Take 5,000 Units by mouth daily. [provider] Taking Active Self  clopidogrel (PLAVIX) 75 MG tablet [id number] Yes TAKE ONE TABLET BY MOUTH DAILY Patwardhan, Manish J, MD Taking Active   diclofenac Sodium (VOLTAREN) 1 % GEL [id number] Yes APPLY TWO GRAMS TOPICALLY FOUR TIMES A DAY Minette Brine, FNP Taking Active   donepezil (ARICEPT) 10 MG tablet [id number] Yes Take 1 tablet (10 mg total) by mouth at bedtime. Minette Brine, FNP Taking Active   Elastic Bandages & Supports ([place]) [place] [id number] Yes  [provider] Taking Active   furosemide (LASIX) 20 MG tablet [id number] Yes TAKE ONE TABLET BY MOUTH DAILY Minette Brine, FNP Taking Active   furosemide (LASIX) 40 MG tablet [id number] Yes Take 1 tablet (40 mg total) by mouth daily. Patwardhan, Reynold Bowen, MD Taking Active   gabapentin (NEURONTIN) 300 MG capsule [id number] Yes TAKE ONE CAPSULE BY MOUTH AT BEDTIME Minette Brine, FNP Taking Active   hydrALAZINE (APRESOLINE) 25 MG tablet [id number] Yes TAKE ONE TABLET BY MOUTH TWICE A DAY Patwardhan, Manish J, MD Taking Active   icosapent Ethyl (VASCEPA) 1 g capsule [id number] Yes TAKE ONE CAPSULE BY MOUTH TWICE A DAY Patwardhan, Manish J, MD Taking Active   isosorbide mononitrate (IMDUR) 30 MG 24 hr tablet [id number] Yes TAKE ONE TABLET BY MOUTH TWICE A DAY Patwardhan, Manish J, MD Taking Active   ketorolac (ACULAR) 0.5 % ophthalmic solution [id number] Yes ketorolac 0.5 % eye drops [provider] Taking Active   levocetirizine (XYZAL) 5 MG tablet [id number] Yes Take 1 tablet (5 mg total) by mouth every evening. Minette Brine, FNP Taking Active   Menthol-Methyl Salicylate (MUSCLE RUB) 10-15 % CREA [id number] Yes muscle rub [provider] Taking Active   methimazole (TAPAZOLE) 10 MG tablet [id number] Yes TAKE ONE TABLET BY MOUTH DAILY Minette Brine, FNP Taking Active   Multiple Vitamins-Minerals (COMPLETE SENIOR PO)  [id number] Yes 1 tablet daily.  [provider] Taking Active Self  tamsulosin (FLOMAX) 0.4 MG CAPS capsule [id number] Yes TAKE ONE CAPSULE BY MOUTH DAILY HALF HOUR FOLLOWING  THE SAME MEAL DAILY Minette Brine, FNP Taking Active   vitamin B-12 (CYANOCOBALAMIN) 500 MCG tablet [id number] Yes Take 500 mcg by mouth daily. [provider] Taking Active Self  vitamin C (ASCORBIC ACID) 500 MG tablet [id number] Yes Take 500 mg daily by mouth. [provider] Taking Active Self            Patient Active Problem List   Diagnosis Date Noted   Leg edema 08/01/2021   Mild cognitive impairment 09/07/2020   Chronic pain of left knee 03/15/2020   Pure hypercholesterolemia 11/15/2019   First degree AV block 07/28/2019   Hypertriglyceridemia 04/29/2019   Mixed hyperlipidemia 04/29/2019   CVA (cerebral vascular accident) ([place])    Bradycardia    Subclinical hyperthyroidism 11/18/2018   Peripheral artery disease ([place]) 06/13/2018   Ischemic cerebrovascular accident (CVA) ([place]) 06/13/2018   Dysarthria 06/12/2018   Renal insufficiency 06/12/2018   Claudication ([employer]) 01/11/2018   Anemia 10/10/2017   Essential hypertension 11/04/2013    Immunization History  Administered Date(s) Administered   Fluad Quad(high Dose 65+) 08/03/2020, 08/15/2021   Influenza, High Dose Seasonal PF 10/29/2018, 08/11/2019   Moderna SARS-COV2 Booster Vaccination 11/15/2020   PFIZER(Purple Top)SARS-COV-2 Vaccination 01/17/2020, 01/29/2020   Unspecified SARS-COV-2 Vaccination 05/04/2021    Conditions to be addressed/monitored:  Hypertension and Hyperlipidemia  Care Plan : [place]  Updates made by Mayford Knife, [place] since 09/20/2021 12:00 AM     Problem: HTN, PAD   Priority: High     Long-Range Goal: Disease Management   This Visit's Progress: On track  Note:   Current Barriers:  Unable to independently monitor therapeutic efficacy  Pharmacist Clinical Goal(s):  Patient  will achieve adherence to monitoring guidelines and medication adherence to achieve therapeutic efficacy through collaboration with PharmD and provider.   Interventions: 1:1 collaboration with Minette Brine, FNP regarding development and update of comprehensive plan of care as evidenced by provider attestation and co-signature Inter-disciplinary care team collaboration (see longitudinal plan of care) Comprehensive medication review performed; medication list updated in electronic medical record  Hypertension (BP goal <130/80) -Uncontrolled -Current treatment: Chlorthalidone 43m tablet by mouth daily Bisoprolol 5 mg tablet - taking 1/2 table daily Amlodipine 5 mg tablet once per day Isosorbide Mononitrate 30 mg table twice per day Hydralazine 25 mg tablet twice per day -Current home readings: patient reports that he is checking his BP everyday, he is checking his BP between 9-9:30 am, 140/63  -Current dietary habits: he does not eat any salt, he prepares some of his meals, but his daughter cooks most of the time and it is salty. He its twice per day.  -Current exercise habits: He [facility] every [facility] He is swimming for 45 minutes on Monday, Wednesday and Friday  -Denies hypotensive/hypertensive symptoms -Educated on BP goals and benefits of medications for prevention of heart attack, stroke and kidney damage; Importance of home blood pressure monitoring; Proper BP monitoring technique; Symptoms of hypotension and importance of maintaining adequate hydration; -Counseled to monitor BP at home at least once per day, document, and provide log at future appointments -Recommended to continue current medication Educated on the importance of witting down his BP readings in a log book and bring it to his next appointment with the cardioogist and PCP team.    Peripheral Artery Disease : (LDL goal < 70) -Controlled -Current treatment: Atorvastatin 80 taking 1 tablet by mouth  daily Vascepa 1 gram- take 1 capsule by mouth twice a  day Clopidogrel 75 mg tablet daily  -Current dietary patterns: He is eating plenty of vegetables and he does not eat any pork.  Rarely eats fried fish or chicken.  -Current exercise habits: please see hypertension for more details  -Educated on Cholesterol goals;  Importance of limiting foods high in cholesterol; -Recommended to continue current medication  Dementia (Goal: improve signs and symptoms of memory loss) -Controlled -Current treatment  Donepezil 10 mg tabet once per day  -Recommended to continue current medication  Patient Goals/Self-Care Activities Patient will:  - take medications as prescribed  Follow Up Plan: The patient has been provided with contact information for the care management team and has been advised to call with any health related questions or concerns.       Medication Assistance: None required.  Patient affirms current coverage meets needs.  Compliance/Adherence/Medication fill history: Care Gaps: Pneumonia Vaccine - Patient confirmed receiving vaccine on 09/18/2021, confirmed in [place] that patient received (Pneumo Poly 23)  TDAP Shingrix COVID-19 Booster  Star-Rating Drugs: Atorvastatin 80 mg tablet   Patient's preferred pharmacy is:  [facility], [place] - [address] [place] [address] Phone: [phone] Fax: [phone]  [place], [address] [place] [address] Phone: [phone] Fax: [phone]  [place] [facility] [place], [place] [address] Phone: [phone] Fax: [phone]  Uses pill box? Yes Pt endorses 80% compliance  We discussed: Benefits of medication synchronization, packaging and delivery as well as enhanced pharmacist oversight with [employer]. Patient decided  to: Continue current medication management strategy  Care Plan and Follow Up Patient Decision:  Patient agrees to Care Plan and Follow-up.  Plan: The patient has been provided with contact information for the care management team and has been advised to call with any health related questions or concerns.   [name], PharmD Clinical Pharmacist [facility] [phone]

## 2021-09-14 ENCOUNTER — Telehealth: Payer: Self-pay

## 2021-09-14 NOTE — Chronic Care Management (AMB) (Signed)
    Chronic Care Management Pharmacy Assistant   Name: Jesus Lam  MRN: [id number] DOB: 03-29-1933   Reason for Encounter: Scheduling pneumonia vaccine at [facility]     Medications: Outpatient Encounter Medications as of 09/14/2021  Medication Sig   amLODipine (NORVASC) 5 MG tablet TAKE ONE TABLET BY MOUTH DAILY   atorvastatin (LIPITOR) 80 MG tablet TAKE 1 TABLET ONE TIME DAILY   azelastine (ASTELIN) 0.1 % nasal spray Place 2 sprays into both nostrils 2 (two) times daily. Use in each nostril as directed   bisoprolol (ZEBETA) 5 MG tablet TAKE HALF TABLET BY MOUTH DAILY   chlorthalidone (HYGROTON) 25 MG tablet Take 1 tablet (25 mg total) by mouth daily.   Cholecalciferol (VITAMIN D3) 5000 units CAPS Take 5,000 Units by mouth daily.   clopidogrel (PLAVIX) 75 MG tablet TAKE ONE TABLET BY MOUTH DAILY   diclofenac Sodium (VOLTAREN) 1 % GEL APPLY TWO GRAMS TOPICALLY FOUR TIMES A DAY   donepezil (ARICEPT) 10 MG tablet Take 1 tablet (10 mg total) by mouth at bedtime.   Elastic Bandages & Supports (MEDICAL COMPRESSION STOCKINGS) MISC    furosemide (LASIX) 20 MG tablet TAKE ONE TABLET BY MOUTH DAILY   furosemide (LASIX) 40 MG tablet Take 1 tablet (40 mg total) by mouth daily.   gabapentin (NEURONTIN) 300 MG capsule TAKE ONE CAPSULE BY MOUTH AT BEDTIME   hydrALAZINE (APRESOLINE) 25 MG tablet TAKE ONE TABLET BY MOUTH TWICE A DAY   icosapent Ethyl (VASCEPA) 1 g capsule TAKE ONE CAPSULE BY MOUTH TWICE A DAY   isosorbide mononitrate (IMDUR) 30 MG 24 hr tablet TAKE ONE TABLET BY MOUTH TWICE A DAY   ketorolac (ACULAR) 0.5 % ophthalmic solution ketorolac 0.5 % eye drops   levocetirizine (XYZAL) 5 MG tablet Take 1 tablet (5 mg total) by mouth every evening.   Menthol-Methyl Salicylate (MUSCLE RUB) 10-15 % CREA muscle rub   methimazole (TAPAZOLE) 10 MG tablet TAKE ONE TABLET BY MOUTH DAILY   Multiple Vitamins-Minerals (COMPLETE SENIOR PO) 1 tablet daily.    tamsulosin (FLOMAX) 0.4 MG CAPS capsule  TAKE ONE CAPSULE BY MOUTH DAILY HALF HOUR FOLLOWING THE SAME MEAL DAILY   vitamin B-12 (CYANOCOBALAMIN) 500 MCG tablet Take 500 mcg by mouth daily.   vitamin C (ASCORBIC ACID) 500 MG tablet Take 500 mg daily by mouth.   Facility-Administered Encounter Medications as of 09/14/2021  Medication   0.9 %  sodium chloride infusion   09-14-2021: Made patient appointment for pneumonia vaccine on 09-18-2021 at 4:00 pm at [facility] at [address] [facility] [address] [place], [place]. Daughter [name] is aware.  Care Gaps: Covid booster overdue last completed 05-04-2021  AWV 02-14-2022 Pneumonia vaccine overdue Tdap overdue Shingrix overdue Covid booster overdue  Star Rating Drugs: Atorvastatin 80 mg- Last filled 08-09-2021 60 DS [facility] Huey Romans CMA Clinical Pharmacist Assistant [phone]

## 2021-09-20 NOTE — Patient Instructions (Addendum)
Visit Information It was great speaking with you today!  Please let me know if you have any questions about our visit.   Goals Addressed             This Visit's Progress    Manage My Medicine       Timeframe:  Long-Range Goal Priority:  High Start Date:                             Expected End Date:                       Follow Up Date 10/31/2021    - call for medicine refill 2 or 3 days before it runs out - call if I am sick and can't take my medicine - keep a list of all the medicines I take; vitamins and herbals too - use an alarm clock or phone to remind me to take my medicine    Why is this important?   These steps will help you keep on track with your medicines.   Notes:  Please call the pharmacy team with any questions you may have.      Track and Manage My Blood Pressure-Hypertension       Timeframe:  Long-Range Goal Priority:  High Start Date:                             Expected End Date:                       Follow Up Date 10/31/2021    - check blood pressure daily - write blood pressure results in a log or diary    Why is this important?   You won't feel high blood pressure, but it can still hurt your blood vessels.  High blood pressure can cause heart or kidney problems. It can also cause a stroke.  Making lifestyle changes like losing a little weight or eating less salt will help.  Checking your blood pressure at home and at different times of the day can help to control blood pressure.  If the doctor prescribes medicine remember to take it the way the doctor ordered.  Call the office if you cannot afford the medicine or if there are questions about it.     Notes:  Please call if you have any questions         Patient Care Plan: CCM Pharmacy Care Plan     Problem Identified: HTN, HLD   Priority: High     Long-Range Goal: Disease Management   This Visit's Progress: On track  Note:   Current Barriers:  Unable to independently monitor  therapeutic efficacy  Pharmacist Clinical Goal(s):  Patient will achieve adherence to monitoring guidelines and medication adherence to achieve therapeutic efficacy through collaboration with PharmD and provider.   Interventions: 1:1 collaboration with [name], FNP regarding development and update of comprehensive plan of care as evidenced by provider attestation and co-signature Inter-disciplinary care team collaboration (see longitudinal plan of care) Comprehensive medication review performed; medication list updated in electronic medical record  Hypertension (BP goal <130/80) -Uncontrolled -Current treatment: Chlorthalidone 25mg  tablet by mouth daily Bisoprolol 5 mg tablet - taking 1/2 table daily Amlodipine 5 mg tablet once per day Isosorbide Mononitrate 30 mg table twice per day Hydralazine 25 mg tablet twice per day -  Current home readings: patient reports that he is checking his BP everyday, he is checking his BP between 9-9:30 am, 140/63  -Current dietary habits: he does not eat any salt, he prepares some of his meals, but his daughter cooks most of the time and it is salty. He its twice per day.  -Current exercise habits: He goes swimming every morning, at the [facility]. He is swimming for 45 minutes on Monday, Wednesday and Friday  -Denies hypotensive/hypertensive symptoms -Educated on BP goals and benefits of medications for prevention of heart attack, stroke and kidney damage; Importance of home blood pressure monitoring; Proper BP monitoring technique; Symptoms of hypotension and importance of maintaining adequate hydration; -Counseled to monitor BP at home at least once per day, document, and provide log at future appointments -Recommended to continue current medication Educated on the importance of witting down his BP readings in a log book and bring it to his next appointment with the cardioogist and PCP team.    Hyperlipidemia : (LDL goal < 70) -Controlled -Current  treatment: Atorvastatin 80 taking 1 tablet by mouth daily Vascepa 1 gram- take 1 capsule by mouth twice a day Clopidogrel 75 mg tablet daily  -Current dietary patterns: He is eating plenty of vegetables and he does not eat any pork.  Rarely eats fried fish or chicken.  -Current exercise habits: please see hypertension for more details  -Educated on Cholesterol goals;  Importance of limiting foods high in cholesterol; -Recommended to continue current medication  Dementia (Goal: improve signs and symptoms of memory loss) -Controlled -Current treatment  Donepezil 10 mg tabet once per day  -Recommended to continue current medication  Patient Goals/Self-Care Activities Patient will:  - take medications as prescribed  Follow Up Plan: The patient has been provided with contact information for the care management team and has been advised to call with any health related questions or concerns.        Mr. Berrios was given information about Chronic Care Management services today including:  CCM service includes personalized support from designated clinical staff supervised by his physician, including individualized plan of care and coordination with other care providers 24/7 contact phone numbers for assistance for urgent and routine care needs. Standard insurance, coinsurance, copays and deductibles apply for chronic care management only during months in which we provide at least 20 minutes of these services. Most insurances cover these services at 100%, however patients may be responsible for any copay, coinsurance and/or deductible if applicable. This service may help you avoid the need for more expensive face-to-face services. Only one practitioner may furnish and bill the service in a calendar month. The patient may stop CCM services at any time (effective at the end of the month) by phone call to the office staff.  Patient agreed to services and verbal consent obtained.   The patient  verbalized understanding of instructions, educational materials, and care plan provided today and agreed to receive a mailed copy of patient instructions, educational materials, and care plan.   Cherylin Mylar, PharmD Clinical Pharmacist [facility] [phone]

## 2021-09-24 ENCOUNTER — Other Ambulatory Visit: Payer: Self-pay | Admitting: Cardiology

## 2021-09-24 ENCOUNTER — Encounter: Payer: Self-pay | Admitting: Pharmacist

## 2021-09-24 DIAGNOSIS — E7800 Pure hypercholesterolemia, unspecified: Secondary | ICD-10-CM

## 2021-09-24 DIAGNOSIS — I10 Essential (primary) hypertension: Secondary | ICD-10-CM | POA: Diagnosis not present

## 2021-09-24 NOTE — Progress Notes (Signed)
CARE PLAN ENTRY  09/24/2021 Name: Jesus Lam MRN: [id number] DOB: 06/01/1933  Jesus Lam is enrolled in Remote Patient Monitoring/Principle Care Monitoring.  Date of Enrollment: 07/10/20 Supervising physician: Truett Mainland Indication: HTN  Remote Readings: Compliant and Avg BP: 142/75, HR:55  Next scheduled OV: 11/01/21  Pharmacist Clinical Goal(s):  Over the next 90 days, patient will demonstrate Improved medication adherence as evidenced by medication fill history Over the next 90 days, patient will demonstrate improved understanding of prescribed medications and rationale for usage as evidenced by patient teach back Over the next 90 days, patient will experience decrease in ED visits. ED visits in last 6 months = 0 Over the next 90 days, patient will not experience hospital admission. Hospital Admissions in last 6 months = 0  Interventions: Provider and Inter-disciplinary care team collaboration (see longitudinal plan of care) Comprehensive medication review performed. Discussed plans with patient for ongoing care management follow up and provided patient with direct contact information for care management team Collaboration with provider re: medication management  Patient Self Care Activities:  Self administers medications as prescribed Attends all scheduled provider appointments Performs ADL's independently Performs IADL's independently  Allergies  Allergen Reactions   Shellfish Allergy Other (See Comments)    Gout    Outpatient Encounter Medications as of 09/24/2021  Medication Sig   hydrALAZINE (APRESOLINE) 25 MG tablet TAKE ONE TABLET BY MOUTH TWICE A DAY   isosorbide mononitrate (IMDUR) 30 MG 24 hr tablet TAKE ONE TABLET BY MOUTH TWICE A DAY   amLODipine (NORVASC) 5 MG tablet TAKE ONE TABLET BY MOUTH DAILY   atorvastatin (LIPITOR) 80 MG tablet TAKE 1 TABLET ONE TIME DAILY   azelastine (ASTELIN) 0.1 % nasal spray Place 2 sprays into both nostrils 2  (two) times daily. Use in each nostril as directed   bisoprolol (ZEBETA) 5 MG tablet TAKE HALF TABLET BY MOUTH DAILY   chlorthalidone (HYGROTON) 25 MG tablet Take 1 tablet (25 mg total) by mouth daily.   Cholecalciferol (VITAMIN D3) 5000 units CAPS Take 5,000 Units by mouth daily.   clopidogrel (PLAVIX) 75 MG tablet TAKE ONE TABLET BY MOUTH DAILY   diclofenac Sodium (VOLTAREN) 1 % GEL APPLY TWO GRAMS TOPICALLY FOUR TIMES A DAY   donepezil (ARICEPT) 10 MG tablet Take 1 tablet (10 mg total) by mouth at bedtime.   Elastic Bandages & Supports (MEDICAL COMPRESSION STOCKINGS) MISC    furosemide (LASIX) 20 MG tablet TAKE ONE TABLET BY MOUTH DAILY   furosemide (LASIX) 40 MG tablet Take 1 tablet (40 mg total) by mouth daily.   gabapentin (NEURONTIN) 300 MG capsule TAKE ONE CAPSULE BY MOUTH AT BEDTIME   icosapent Ethyl (VASCEPA) 1 g capsule TAKE ONE CAPSULE BY MOUTH TWICE A DAY   ketorolac (ACULAR) 0.5 % ophthalmic solution ketorolac 0.5 % eye drops   levocetirizine (XYZAL) 5 MG tablet Take 1 tablet (5 mg total) by mouth every evening.   Menthol-Methyl Salicylate (MUSCLE RUB) 10-15 % CREA muscle rub   methimazole (TAPAZOLE) 10 MG tablet TAKE ONE TABLET BY MOUTH DAILY   Multiple Vitamins-Minerals (COMPLETE SENIOR PO) 1 tablet daily.    tamsulosin (FLOMAX) 0.4 MG CAPS capsule TAKE ONE CAPSULE BY MOUTH DAILY HALF HOUR FOLLOWING THE SAME MEAL DAILY   vitamin B-12 (CYANOCOBALAMIN) 500 MCG tablet Take 500 mcg by mouth daily.   vitamin C (ASCORBIC ACID) 500 MG tablet Take 500 mg daily by mouth.   Facility-Administered Encounter Medications as of 09/24/2021  Medication   0.9 %  sodium chloride infusion    Hypertension   BP goal is:  <140/90  Office blood pressures are  BP Readings from Last 3 Encounters:  08/15/21 (!) 166/74  08/01/21 (!) 159/70  07/05/21 133/74    Patient is currently controlled on the following medications: Hydralazine 25 mg BID, imdur 30 mg BID, lasix 20 mg, amlodipine 5 mg,  bisoprolol 5 mg, chlorthalidone 25 mg.   Patient checks BP at home daily  Patient home BP readings are ranging: 113-169/56-90  We discussed diet and exercise extensively  Plan  Continue current medications and control with diet and exercise   ______________ Visit Information SDOH (Social Determinants of Health) assessments performed: Yes.  Mr. Guggenheim was given information about Principle Care Management/Remote Patient Monitoring services today including:  RPM/PCM service includes personalized support from designated clinical staff supervised by his physician, including individualized plan of care and coordination with other care providers 24/7 contact phone numbers for assistance for urgent and routine care needs. Standard insurance, coinsurance, copays and deductibles apply for principle care management only during months in which we provide at least 30 minutes of these services. Most insurances cover these services at 100%, however patients may be responsible for any copay, coinsurance and/or deductible if applicable. This service may help you avoid the need for more expensive face-to-face services. Only one practitioner may furnish and bill the service in a calendar month. The patient may stop PCM/RPM services at any time (effective at the end of the month) by phone call to the office staff.  Patient agreed to services and verbal consent obtained.   Cassell Clement, Pharm.D. Clinical Pharmacist [facility] [phone] Ext: [phone]

## 2021-09-29 DIAGNOSIS — I10 Essential (primary) hypertension: Secondary | ICD-10-CM | POA: Diagnosis not present

## 2021-10-01 ENCOUNTER — Other Ambulatory Visit: Payer: Self-pay | Admitting: Nurse Practitioner

## 2021-10-01 DIAGNOSIS — G3184 Mild cognitive impairment, so stated: Secondary | ICD-10-CM

## 2021-10-03 ENCOUNTER — Encounter: Payer: Self-pay | Admitting: Nurse Practitioner

## 2021-10-17 DIAGNOSIS — R6889 Other general symptoms and signs: Secondary | ICD-10-CM | POA: Diagnosis not present

## 2021-10-19 DIAGNOSIS — R6889 Other general symptoms and signs: Secondary | ICD-10-CM | POA: Diagnosis not present

## 2021-10-22 ENCOUNTER — Other Ambulatory Visit: Payer: Self-pay | Admitting: Cardiology

## 2021-10-22 ENCOUNTER — Other Ambulatory Visit: Payer: Self-pay | Admitting: Nurse Practitioner

## 2021-10-22 DIAGNOSIS — J310 Chronic rhinitis: Secondary | ICD-10-CM

## 2021-10-23 ENCOUNTER — Telehealth: Payer: Self-pay

## 2021-10-23 NOTE — Telephone Encounter (Signed)
Diane Perlie Gold RN with [facility] said that the pt has been taking 60 mg of Lasix, she was told that the pt is supposed to be taking 40 mg based off of his cardiology note from Dr. Ninfa Meeker at [facility].

## 2021-10-29 DIAGNOSIS — I10 Essential (primary) hypertension: Secondary | ICD-10-CM | POA: Diagnosis not present

## 2021-10-30 ENCOUNTER — Telehealth: Payer: Self-pay

## 2021-10-30 NOTE — Chronic Care Management (AMB) (Signed)
    Called Jesus Lam, No answer, left message of appointment on 12-07-202 at 2:00 via telephone visit with Cherylin Mylar, Pharm D. Notified to have all medications, supplements, blood pressure and/or blood sugar logs available during appointment and to return call if need to reschedule. Spoke with daughter and she stated patient wasn't around and to call hs phone.   Care Gaps: Covid booster overdue last completed 05-04-2021  AWV 02-14-2022 Shingrix overdue  Tdap overdue  Star Rating Drug: Atorvastatin 80 mg- Last filled 09-06-2021 60 DS [facility].  Any gaps in medications fill history? No  Huey Romans [facility] Clinical Pharmacist Assistant [phone]

## 2021-10-31 ENCOUNTER — Ambulatory Visit (INDEPENDENT_AMBULATORY_CARE_PROVIDER_SITE_OTHER): Payer: Medicare HMO

## 2021-10-31 VITALS — BP 123/66 | HR 68

## 2021-10-31 DIAGNOSIS — E7800 Pure hypercholesterolemia, unspecified: Secondary | ICD-10-CM

## 2021-10-31 DIAGNOSIS — I10 Essential (primary) hypertension: Secondary | ICD-10-CM

## 2021-10-31 NOTE — Progress Notes (Signed)
Chronic Care Management Pharmacy Note  11/08/2021 Name:  Jesus Lam MRN:  [id number] DOB:  1933-07-10  Summary: Patient reports that he is doing well.   Recommendations/Changes made from today's visit: Recommend patient receive COVID-19 vaccine. Recommend patient record BP readings in a log book and bring them to his next office visit.   Plan: Patient was scheduled for COVID-19 booster on tomorrow at 11:00 AM. Mr. Ging is going to start writing down his BP readings daily when he takes them in the morning.    Subjective: Teron Blais is an 87 y.o. year old male who is a primary patient of Minette Brine, [place].  The CCM team was consulted for assistance with disease management and care coordination needs.    Engaged with patient by telephone for follow up visit in response to provider referral for pharmacy case management and/or care coordination services. Patient reports that he is doing okay.   Consent to Services:  The patient was given information about Chronic Care Management services, agreed to services, and gave verbal consent prior to initiation of services.  Please see initial visit note for detailed documentation.   Patient Care Team: Minette Brine, FNP as PCP - General (General Practice) Rex Kras Claudette Stapler, RN as Case Manager Mayford Knife, [facility] (Pharmacist)  Recent office visits: 08/15/2021 PCP OV  05/15/2021 PCP OV   Recent consult visits: 08/29/2021 Cardiology OV  07/05/2021 Cardiology OV  05/16/2021 Rheumatology [facility] visits: None in previous 6 months   Objective:  Lab Results  Component Value Date   CREATININE 1.19 08/23/2021   BUN 14 08/23/2021   GFRNONAA 55 (L) 08/03/2020   GFRAA 64 08/03/2020   NA 145 (H) 08/23/2021   K 4.7 08/23/2021   CALCIUM 9.4 08/23/2021   CO2 22 08/23/2021   GLUCOSE 93 08/23/2021    Lab Results  Component Value Date/Time   HGBA1C 5.1 02/03/2020 03:37 PM   HGBA1C 5.3 06/13/2018 10:18 AM   MICROALBUR 10  02/08/2021 03:00 PM   MICROALBUR 30 02/03/2020 03:24 PM    Last diabetic Eye exam: No results found for: HMDIABEYEEXA  Last diabetic Foot exam: No results found for: HMDIABFOOTEX   Lab Results  Component Value Date   CHOL 124 02/08/2021   HDL 32 (L) 02/08/2021   LDLCALC 59 02/08/2021   TRIG 203 (H) 02/08/2021   CHOLHDL 3.9 02/08/2021    Hepatic Function Latest Ref Rng & Units 02/08/2021 08/03/2020 04/27/2020  Total Protein 6.0 - 8.5 g/dL 7.4 7.4 7.1  Albumin 3.6 - 4.6 g/dL 4.0 4.2 3.9  AST 0 - 40 IU/L _0 ALT 0 - 44 IU/L _1 Alk Phosphatase 44 - 121 IU/L 93 109 86  Total Bilirubin 0.0 - 1.2 mg/dL 0.5 0.4 0.7    Lab Results  Component Value Date/Time   TSH 1.110 02/08/2021 03:29 PM   TSH 3.740 08/03/2020 03:06 PM   FREET4 1.13 01/28/2019 04:06 PM   FREET4 1.00 12/14/2018 01:38 PM    CBC Latest Ref Rng & Units 01/28/2019 11/17/2018 10/29/2018  WBC 3.4 - 10.8 x10E3/uL 6.2 6.0 4.8  Hemoglobin 13.0 - 17.7 g/dL 11.7(L) 12.1(L) 11.7(L)  Hematocrit 37.5 - 51.0 % 36.4(L) 38.7(L) 35.7(L)  Platelets 150 - 450 x10E3/uL 194 155 171    No results found for: VD25OH  Clinical ASCVD: Yes  The ASCVD Risk score (Arnett DK, et al., 2019) failed to calculate for the following reasons:   The 2019 ASCVD risk  score is only valid for ages 22 to 41   The patient has a prior MI or stroke diagnosis    Depression screen [facility] 2/9 02/08/2021 02/03/2020 02/03/2020  Decreased Interest 0 0 0  Down, Depressed, Hopeless 3 0 0  PHQ - 2 Score 3 0 0  Altered sleeping 0 0 -  Tired, decreased energy 3 0 -  Change in appetite 0 0 -  Feeling bad or failure about yourself  0 0 -  Trouble concentrating 3 0 -  Moving slowly or fidgety/restless 0 0 -  Suicidal thoughts 0 0 -  PHQ-9 Score 9 0 -  Difficult doing work/chores Somewhat difficult Not difficult at all -      Social History   Tobacco Use  Smoking Status Former   Types: Cigarettes  Smokeless Tobacco Never   BP Readings from Last 3  Encounters:  11/08/21 123/66  08/15/21 (!) 166/74  08/01/21 (!) 159/70   Pulse Readings from Last 3 Encounters:  11/08/21 68  08/15/21 (!) 56  08/01/21 64   Wt Readings from Last 3 Encounters:  08/15/21 239 lb (108.4 kg)  08/01/21 236 lb (107 kg)  07/05/21 240 lb (108.9 kg)   BMI Readings from Last 3 Encounters:  08/15/21 32.41 kg/m  08/01/21 32.01 kg/m  07/05/21 33.47 kg/m    Assessment/Interventions: Review of patient past medical history, allergies, medications, health status, including review of consultants reports, laboratory and other test data, was performed as part of comprehensive evaluation and provision of chronic care management services.   SDOH:  (Social Determinants of Health) assessments and interventions performed: No  SDOH Screenings   Alcohol Screen: Not on file  Depression (PHQ2-9): Medium Risk   PHQ-2 Score: 9  Financial Resource Strain: Low Risk    Difficulty of Paying Living Expenses: Not hard at all  Food Insecurity: No Food Insecurity   Worried About Charity fundraiser in the Last Year: Never true   Ran Out of Food in the Last Year: Never true  Housing: Not on file  Physical Activity: Insufficiently Active   Days of Exercise per Week: 3 days   Minutes of Exercise per Session: 40 min  Social Connections: Not on file  Stress: Stress Concern Present   Feeling of Stress : To some extent  Tobacco Use: Medium Risk   Smoking Tobacco Use: Former   Smokeless Tobacco Use: Never   Passive Exposure: Not on file  Transportation Needs: No Transportation Needs   Lack of Transportation (Medical): No   Lack of Transportation (Non-Medical): No    CCM Care Plan  Allergies  Allergen Reactions   Shellfish Allergy Other (See Comments)    Gout     Medications Reviewed Today     Reviewed by Mayford Knife, [facility] (Pharmacist) on 11/08/21 at [place]  Med List Status: <None>   Medication Order Taking? Sig Documenting Provider Last Dose Status Informant   0.9 %  sodium chloride infusion [id number]   Ghumman, Ramandeep, NP  Active   amLODipine (NORVASC) 5 MG tablet [id number] Yes TAKE ONE TABLET BY MOUTH DAILY Patwardhan, Manish J, MD Taking Active   atorvastatin (LIPITOR) 40 MG tablet [id number] Yes Take 40 mg by mouth daily. [provider] Taking Active   azelastine (ASTELIN) 0.1 % nasal spray [id number] Yes Place 2 sprays into both nostrils 2 (two) times daily. Use in each nostril as directed Minette Brine, FNP Taking Active   bisoprolol (ZEBETA) 5 MG tablet [id number] Yes TAKE HALF TABLET  BY MOUTH DAILY Patwardhan, Manish J, MD Taking Active   chlorthalidone (HYGROTON) 25 MG tablet [id number] Yes Take 1 tablet (25 mg total) by mouth daily. [place] Chard, MD Taking Active   Cholecalciferol (VITAMIN D3) 5000 units CAPS [id number] Yes Take 5,000 Units by mouth daily. [provider] Taking Active Self  clopidogrel (PLAVIX) 75 MG tablet [id number] Yes TAKE ONE TABLET BY MOUTH DAILY Patwardhan, Manish J, MD Taking Active   donepezil (ARICEPT) 10 MG tablet [id number] Yes TAKE 1 TABLET AT BEDTIME Minette Brine, FNP Taking Active   Elastic Bandages & Supports ([place]) [place] [id number] Yes  [provider] Taking Active   furosemide (LASIX) 20 MG tablet [id number] Yes TAKE ONE TABLET BY MOUTH DAILY Minette Brine, FNP Taking Active   furosemide (LASIX) 40 MG tablet [id number] Yes Take 1 tablet (40 mg total) by mouth daily. Patwardhan, Reynold Bowen, MD Taking Active   gabapentin (NEURONTIN) 300 MG capsule [id number] Yes TAKE ONE CAPSULE BY MOUTH AT BEDTIME Minette Brine, FNP Taking Active   hydrALAZINE (APRESOLINE) 25 MG tablet [id number] Yes TAKE ONE TABLET BY MOUTH TWICE A DAY Patwardhan, Manish J, MD Taking Active   icosapent Ethyl (VASCEPA) 1 g capsule [id number] Yes TAKE ONE CAPSULE BY MOUTH TWICE A DAY Patwardhan, Manish J, MD Taking Active   isosorbide mononitrate (IMDUR) 30 MG 24 hr tablet [id number] Yes TAKE ONE TABLET  BY MOUTH TWICE A DAY Patwardhan, Manish J, MD Taking Active   ketorolac (ACULAR) 0.5 % ophthalmic solution [id number] Yes ketorolac 0.5 % eye drops [provider] Taking Active   levocetirizine (XYZAL) 5 MG tablet [id number] Yes TAKE ONE TABLET BY MOUTH EVERY Wenda Low, FNP Taking Active   Menthol-Methyl Salicylate (MUSCLE RUB) 10-15 % CREA [id number] Yes muscle rub [provider] Taking Active   methimazole (TAPAZOLE) 10 MG tablet [id number] Yes TAKE ONE TABLET BY MOUTH DAILY Minette Brine, FNP Taking Active   Multiple Vitamins-Minerals (COMPLETE SENIOR PO) [id number] Yes 1 tablet daily.  [provider] Taking Active Self  tamsulosin (FLOMAX) 0.4 MG CAPS capsule [id number] Yes TAKE ONE CAPSULE BY MOUTH DAILY HALF HOUR FOLLOWING THE SAME MEAL DAILY Minette Brine, FNP Taking Active   vitamin B-12 (CYANOCOBALAMIN) 500 MCG tablet [id number] Yes Take 500 mcg by mouth daily. [provider] Taking Active Self  vitamin C (ASCORBIC ACID) 500 MG tablet [id number] Yes Take 500 mg daily by mouth. [provider] Taking Active Self            Patient Active Problem List   Diagnosis Date Noted   Leg edema 08/01/2021   Mild cognitive impairment 09/07/2020   Chronic pain of left knee 03/15/2020   Pure hypercholesterolemia 11/15/2019   First degree AV block 07/28/2019   Hypertriglyceridemia 04/29/2019   Mixed hyperlipidemia 04/29/2019   CVA (cerebral vascular accident) [facility])    Bradycardia    Subclinical hyperthyroidism 11/18/2018   Peripheral artery disease ([place]) 06/13/2018   Ischemic cerebrovascular accident (CVA) ([place]) 06/13/2018   Dysarthria 06/12/2018   Renal insufficiency 06/12/2018   Claudication ([place]) 01/11/2018   Anemia 10/10/2017   Essential hypertension 11/04/2013    Immunization History  Administered Date(s) Administered   Fluad Quad(high Dose 65+) 08/03/2020, 08/15/2021   Influenza, High Dose Seasonal PF 10/29/2018, 08/11/2019    Moderna SARS-COV2 Booster Vaccination 11/15/2020   PFIZER(Purple Top)SARS-COV-2 Vaccination 01/17/2020, 01/29/2020   Pneumococcal Polysaccharide-23 09/18/2021   Unspecified SARS-COV-2 Vaccination 05/04/2021    Conditions to be addressed/monitored:  Hypertension and Hyperlipidemia  Care Plan :  CCM Pharmacy Care Plan  Updates made by Mayford Knife, RPH since 11/08/2021 12:00 AM     Problem: HTN, PAD, Health Maintenance   Priority: High     Long-Range Goal: Disease Management   Recent Progress: On track  Note:   Current Barriers:  Does not contact provider office for questions/concerns  Pharmacist Clinical Goal(s):  Patient will contact provider office for questions/concerns as evidenced notation of same in electronic health record through collaboration with PharmD and provider.   Interventions: 1:1 collaboration with Minette Brine, FNP regarding development and update of comprehensive plan of care as evidenced by provider attestation and co-signature Inter-disciplinary care team collaboration (see longitudinal plan of care) Comprehensive medication review performed; medication list updated in electronic medical record  Hypertension (BP goal <130/80) -Uncontrolled -Current treatment: Amlodipine 5 mg tablet once per day  Bisoprolol 5 mg tablet- taking 1/2 tablet once per day  Chlorthalidone 25 mg tablet once per day  Hydralazine 25 mg tablet twice per day  Isosorbide Mononitrate- 30 mg tablet twice per day  -Current home readings: 123/66 pulse: 68, Mr. Riolo is checking his BP everyday, he checks in the morning. He is not currently writing down his BP readings but he is going to start writing them down.  -Current dietary habits: Patient reports that he is limiting the amount of salt he uses in his food. He reports that he has good eating habits.  -Current exercise habits: patient reports that he exercises all the time, he swims three times per week at [facility],  and he walks everyday. He is exercising at least half an hour a day and on swim days he is swimming for 45 minutes and then he walks some more.  -Denies hypotensive/hypertensive symptoms -Educated on Daily salt intake goal < 2300 mg; Importance of home blood pressure monitoring; Proper BP monitoring technique; -Counseled to monitor BP at home, document, and provide log at future appointments -Counseled on diet and exercise extensively Recommended to continue current medication  Peripheral Artery Disease/Pure Hypercholesterolemia: (LDL goal < 70) -Uncontrolled -Current treatment: Atorvastatin 40 mg tablet once per day  Clopidogrel 75 mg tablet once per day Vascepa 1 gram capsule twice per day  -Medications previously tried: Atorvastatin 80 mg discontinued    -Current dietary patterns: he reports trying to limit his fried and fatty foods, he is eating more vegetables  -Current exercise habits: patient reports that at this time he is not currently exercising -Educated on Cholesterol goals;  Benefits of statin for ASCVD risk reduction; Importance of limiting foods high in cholesterol; -Recommended to continue current medication   Health Maintenance -Vaccine gaps: COVID-19 Booster, TDAP, Shingrix     -Scheduled patient for COVID-19 Booster on 11/09/2021 at [facility] on [address]  -Recommended patient receive vaccines that are recommended. Patient agreed that this was important.    Patient Goals/Self-Care Activities Patient will:  - take medications as prescribed as evidenced by patient report and record review  Follow Up Plan: The patient has been provided with contact information for the care management team and has been advised to call with any health related questions or concerns.       Medication Assistance: None required.  Patient affirms current coverage meets needs.  Compliance/Adherence/Medication fill history: Care Gaps: TDAP Shingrix Vaccine  COVID-19 Booster    Star-Rating Drugs: Atorvastatin 40 mg tablet  Patient's preferred pharmacy is:  [facility], [place]  [address] Phone: [phone] Fax: [place], [place] [address] Phone: [phone] Fax: [phone]  [place], [place] [address] Phone: [phone] Fax: [phone]  Uses pill box? Yes Pt endorses 85% compliance  We discussed: Benefits of medication synchronization, packaging and delivery as well as enhanced pharmacist oversight with [employer]. Patient decided to: Continue current medication management strategy  Care Plan and Follow Up Patient Decision:  Patient agrees to Care Plan and Follow-up.  Plan: The patient has been provided with contact information for the care management team and has been advised to call with any health related questions or concerns.  Next PCP appointment scheduled for:  11/22/2021   [name], CPP, PharmD Clinical Pharmacist Practitioner [facility] [phone]

## 2021-11-01 ENCOUNTER — Ambulatory Visit: Payer: Medicare HMO | Admitting: Cardiology

## 2021-11-01 DIAGNOSIS — Z87891 Personal history of nicotine dependence: Secondary | ICD-10-CM | POA: Diagnosis not present

## 2021-11-01 DIAGNOSIS — H6123 Impacted cerumen, bilateral: Secondary | ICD-10-CM | POA: Insufficient documentation

## 2021-11-01 DIAGNOSIS — H9113 Presbycusis, bilateral: Secondary | ICD-10-CM | POA: Insufficient documentation

## 2021-11-08 NOTE — Patient Instructions (Signed)
Visit Information It was great speaking with you today!  Please let me know if you have any questions about our visit.   Goals Addressed             This Visit's Progress    Manage My Medicine       Timeframe:  Long-Range Goal Priority:  High Start Date:                             Expected End Date:                       Follow Up Date 02/15/2022  In Progress:   - call for medicine refill 2 or 3 days before it runs out - call if I am sick and can't take my medicine - keep a list of all the medicines I take; vitamins and herbals too - use an alarm clock or phone to remind me to take my medicine    Why is this important?   These steps will help you keep on track with your medicines.   Notes:  Please call the pharmacy team with any questions you may have.        Patient Care Plan: CCM Pharmacy Care Plan     Problem Identified: HTN, PAD, Health Maintenance   Priority: High     Long-Range Goal: Disease Management   Recent Progress: On track  Note:   Current Barriers:  Does not contact provider office for questions/concerns  Pharmacist Clinical Goal(s):  Patient will contact provider office for questions/concerns as evidenced notation of same in electronic health record through collaboration with PharmD and provider.   Interventions: 1:1 collaboration with [name], FNP regarding development and update of comprehensive plan of care as evidenced by provider attestation and co-signature Inter-disciplinary care team collaboration (see longitudinal plan of care) Comprehensive medication review performed; medication list updated in electronic medical record  Hypertension (BP goal <130/80) -Uncontrolled -Current treatment: Amlodipine 5 mg tablet once per day  Bisoprolol 5 mg tablet- taking 1/2 tablet once per day  Chlorthalidone 25 mg tablet once per day  Hydralazine 25 mg tablet twice per day  Isosorbide Mononitrate- 30 mg tablet twice per day  -Current home  readings: 123/66 pulse: 68, Mr. Lampkins is checking his BP everyday, he checks in the morning. He is not currently writing down his BP readings but he is going to start writing them down.  -Current dietary habits: Patient reports that he is limiting the amount of salt he uses in his food. He reports that he has good eating habits.  -Current exercise habits: patient reports that he exercises all the time, he swims three times per week at [facility], and he walks everyday. He is exercising at least half an hour a day and on swim days he is swimming for 45 minutes and then he walks some more.  -Denies hypotensive/hypertensive symptoms -Educated on Daily salt intake goal < 2300 mg; Importance of home blood pressure monitoring; Proper BP monitoring technique; -Counseled to monitor BP at home, document, and provide log at future appointments -Counseled on diet and exercise extensively Recommended to continue current medication  Peripheral Artery Disease/Pure Hypercholesterolemia: (LDL goal < 70) -Uncontrolled -Current treatment: Atorvastatin 40 mg tablet once per day  Clopidogrel 75 mg tablet once per day Vascepa 1 gram capsule twice per day  -Medications previously tried: Atorvastatin 80 mg discontinued    -Current  dietary patterns: he reports trying to limit his fried and fatty foods, he is eating more vegetables  -Current exercise habits: patient reports that at this time he is not currently exercising -Educated on Cholesterol goals;  Benefits of statin for ASCVD risk reduction; Importance of limiting foods high in cholesterol; -Recommended to continue current medication   Health Maintenance -Vaccine gaps: COVID-19 Booster, TDAP, Shingrix     -Scheduled patient for COVID-19 Booster on 11/09/2021 at [facility] on [address]  -Recommended patient receive vaccines that are recommended. Patient agreed that this was important.    Patient Goals/Self-Care Activities Patient will:  - take  medications as prescribed as evidenced by patient report and record review  Follow Up Plan: The patient has been provided with contact information for the care management team and has been advised to call with any health related questions or concerns.      Patient agreed to services and verbal consent obtained.   The patient verbalized understanding of instructions, educational materials, and care plan provided today and agreed to receive a mailed copy of patient instructions, educational materials, and care plan.   Cherylin Mylar, PharmD Clinical Pharmacist [facility] [phone]

## 2021-11-12 ENCOUNTER — Ambulatory Visit: Payer: Medicare HMO | Admitting: Cardiology

## 2021-11-12 ENCOUNTER — Other Ambulatory Visit: Payer: Self-pay | Admitting: Nurse Practitioner

## 2021-11-12 ENCOUNTER — Other Ambulatory Visit: Payer: Self-pay | Admitting: Cardiology

## 2021-11-12 NOTE — Progress Notes (Deleted)
Subjective:   Jesus Lam, male    DOB: Mar 30, 1933, 87 y.o.   MRN: [id number]   Chief complaint:  PAD   87 year old African-American male with severe bilateral PAD with class III claudication, now s/p successful DCB Rt SFA and 2 overlapping Zilver PTX stents placed left SFA (12/2017), s/p left temporal occipital cortical infarct (05/2018).  He is able to swim laps without much difficulty, but has pain in left knee on walking. He does have bilateral leg swelling today, but denies any significant shortness of breath, chest pain symptoms.    Current Outpatient Medications on File Prior to Visit  Medication Sig Dispense Refill   amLODipine (NORVASC) 5 MG tablet TAKE ONE TABLET BY MOUTH DAILY 90 tablet 2   atorvastatin (LIPITOR) 40 MG tablet Take 40 mg by mouth daily.     azelastine (ASTELIN) 0.1 % nasal spray Place 2 sprays into both nostrils 2 (two) times daily. Use in each nostril as directed 30 mL 5   bisoprolol (ZEBETA) 5 MG tablet TAKE HALF TABLET BY MOUTH DAILY 60 tablet 2   chlorthalidone (HYGROTON) 25 MG tablet Take 1 tablet (25 mg total) by mouth daily. 5 tablet 0   Cholecalciferol (VITAMIN D3) 5000 units CAPS Take 5,000 Units by mouth daily.     clopidogrel (PLAVIX) 75 MG tablet TAKE ONE TABLET BY MOUTH DAILY 90 tablet 3   donepezil (ARICEPT) 10 MG tablet TAKE 1 TABLET AT BEDTIME 90 tablet 1   Elastic Bandages & Supports (MEDICAL COMPRESSION STOCKINGS) MISC      furosemide (LASIX) 20 MG tablet TAKE ONE TABLET BY MOUTH DAILY 90 tablet 4   furosemide (LASIX) 40 MG tablet Take 1 tablet (40 mg total) by mouth daily. 60 tablet 3   gabapentin (NEURONTIN) 300 MG capsule TAKE ONE CAPSULE BY MOUTH AT BEDTIME 90 capsule 0   hydrALAZINE (APRESOLINE) 25 MG tablet TAKE ONE TABLET BY MOUTH TWICE A DAY 60 tablet 0   icosapent Ethyl (VASCEPA) 1 g capsule TAKE ONE CAPSULE BY MOUTH TWICE A DAY 60 capsule 0   isosorbide mononitrate (IMDUR) 30 MG 24 hr tablet TAKE ONE TABLET BY MOUTH TWICE A  DAY 60 tablet 0   ketorolac (ACULAR) 0.5 % ophthalmic solution ketorolac 0.5 % eye drops     levocetirizine (XYZAL) 5 MG tablet TAKE ONE TABLET BY MOUTH EVERY EVENING 90 tablet 4   Menthol-Methyl Salicylate (MUSCLE RUB) 10-15 % CREA muscle rub     methimazole (TAPAZOLE) 10 MG tablet TAKE ONE TABLET BY MOUTH DAILY 30 tablet 0   Multiple Vitamins-Minerals (COMPLETE SENIOR PO) 1 tablet daily.      tamsulosin (FLOMAX) 0.4 MG CAPS capsule TAKE ONE CAPSULE BY MOUTH DAILY HALF HOUR FOLLOWING THE SAME MEAL DAILY 90 capsule 1   vitamin B-12 (CYANOCOBALAMIN) 500 MCG tablet Take 500 mcg by mouth daily.     vitamin C (ASCORBIC ACID) 500 MG tablet Take 500 mg daily by mouth.     Current Facility-Administered Medications on File Prior to Visit  Medication Dose Route Frequency Provider Last Rate Last Admin   0.9 %  sodium chloride infusion   Intravenous PRN Bary Castilla, NP        Cardiovascular studies:  Lower Extremity Arterial Duplex 07/24/2021:  There is near occlusion in the right proximal to mid SFA on the right and  diffuse disease with markedly abnormal monophasic waveform below the SFA.  lower extremity arterial system.  There is diffuse disease in the left SFA with markedly  abnormal monophasic  waveform below the distal SFA suggestive of diffuse small vessel disease.  This exam reveals moderately decreased perfusion of the right lower  extremity, noted at the post tibial artery level (ABI 0.63) and moderately  decreased perfusion of the left lower extremity, noted at the anterior  tibial artery level (ABI 0.56).  Compared to 11/19/2017, previously right distal SFA stenosis of >50% was  noted and left distal SFA >70% stenosis is noted.    No significant change in ABI.  Study suggests probably patency of left SFA  angioplasty site and progression of the disease severity in the right  lower extremity.  EKG 07/05/2021: Sinus rhythm 62 bpm First degree A-V block  Occasional PAC    Left  ventricular hypertrophy Old anteroseptal infarct Right bundle branch block Left anterior fascicular block    ABI 02/05/2018: This exam reveals moderately decreased perfusion of the lower extremity, noted at  the post tibial artery level (ABI). Right ABI 0.79 and left ABI 0.71. Compared to  11/19/2017:   LABI 0.67 and RABI 0.59.  Periperal angiography 01/27/2018:  Right mid and distal SFA CTO, moderate calcification evident. Successful PTA and stenting of the CTO right mid and distal SFA with antegrade left femoral and retrograde right PT access and implantation of 2 overlapping 6.0 x 1 20 mm Zilver PTX drug-coated stents. Stenosis reduced from 100% to 0%. Brisk flow was evident at the end of the procedure..  Echocardiogram 07/24/2018: 1. Left ventricle cavity is normal in size. Mild concentric hypertrophy of the left ventricle. Doppler evidence of grade I (impaired) diastolic dysfunction, normal LAP. Left ventricle regional wall motion findings: Basal and mid inferior hypokinesis. Calculated EF 46%. 2. Moderate aortic valve leaflet thickening. Mildly restricted aortic valve leaflets. No evidence of aortic valve stenosis. Mild (Grade I) aortic regurgitation. 3. Structurally normal mitral valve with mild (Grade I) regurgitation. 4. Compared to the study done on 09/20/2017, no significant change in LVEF. Wall motion appears to be new. Previously mitral regurgitation was mild to moderate.   Recent labs: 02/08/2021: Glucose 90, BUN/Cr 15/1.39. EGFR 49. Na/K 136/5.1. Rest of the CMP normal Chol 124, TG 203, HDL 32, LDL 59 TSH 1.1 normal  04/27/2020: Glucose 86, BUN/Cr 12/1.16. EGFR 66. Na/K 141/4.8. Rest of the CMP normal Chol 120, TG 165, HDL 34, LDL 58 TSH 0.9 normal  02/03/2020: Glucose 81, BUN/Cr 16/1.1. EGFR 70. Na/K 142/4.1. Rest of the CMP normal HbA1C 5.1% Chol 140, TG 253, HDL 31, LDL 68 TSH 8.5, elevated  12/29/2019: H/H 11.7/36.4. MCV 92. Platelets 194 Chol 108, TG 143, HDL 32,  LDL 51   Review of Systems  Cardiovascular:  Positive for claudication (Left leg) and leg swelling. Negative for chest pain, dyspnea on exertion, palpitations and syncope.  Musculoskeletal:  Positive for joint pain.      There were no vitals filed for this visit.    There is no height or weight on file to calculate BMI. There were no vitals filed for this visit.     Objective:    Physical Exam Vitals and nursing note reviewed.  Constitutional:      Appearance: He is well-developed.  Neck:     Vascular: No JVD.  Cardiovascular:     Rate and Rhythm: Normal rate and regular rhythm.     Pulses:          Femoral pulses are 3+ on the right side and 3+ on the left side.      Popliteal pulses  are 1+ on the right side and 1+ on the left side.       Dorsalis pedis pulses are 0 on the right side and 0 on the left side.       Posterior tibial pulses are 0 on the right side and 0 on the left side.     Heart sounds: Normal heart sounds. No murmur heard. Pulmonary:     Effort: Pulmonary effort is normal.     Breath sounds: Normal breath sounds. No wheezing or rales.  Musculoskeletal:     Right lower leg: Edema (1+) present.     Left lower leg: Edema (1+) present.          Assessment & Recommendations:   87 year old African-American male with PAD, h/o left temporal occipital cortical infarct (05/2018).  PAD: DCB Rt SFA and 2 overlapping Zilver PTX stents placed left SFA (12/2017). B/l moderately reduced ABI, details above. However, his pain is primarily in left knee likely due to osteoarthritis. If pain does not improve after OA management, will consider peripheral angiography and possible intervention. Continue medical management. Not on Pletal due to prior h/o heart failure  Hypertension: Uncontrolled. Has 1+ b/l leg edema. Increased lasix to 40 mg  Hypertriglyceridemia: Improved on lipitor and vascepa.  F/u in 3 months  [place], MD [facility]  Cardiovascular. PA Pager: [phone] Office: [phone] If no answer Cell [phone]

## 2021-11-21 ENCOUNTER — Telehealth: Payer: Self-pay | Admitting: *Deleted

## 2021-11-21 NOTE — Chronic Care Management (AMB) (Signed)
°  Care Management   Note  11/21/2021 Name: Jesus Lam MRN: [id number] DOB: Nov 04, 1933  Jesus Lam is a 87 y.o. year old male who is a primary care patient of [name], FNP and is actively engaged with the care management team. I reached out to [employer] by phone today to assist with re-scheduling a follow up visit with the RN Case Manager  Follow up plan: Unsuccessful telephone outreach attempt made. A HIPAA compliant phone message was left for the patient providing contact information and requesting a return call.  The care management team will reach out to the patient again over the next 7 days.  If patient returns call to provider office, please advise to call Embedded Care Management Care Guide Misty Stanley at [phone]  [place] Guide, Embedded Care Coordination [facility]   Care Management  Direct Dial: [phone]

## 2021-11-22 ENCOUNTER — Ambulatory Visit: Payer: Medicare HMO | Admitting: Nurse Practitioner

## 2021-11-24 DIAGNOSIS — E7800 Pure hypercholesterolemia, unspecified: Secondary | ICD-10-CM | POA: Diagnosis not present

## 2021-11-24 DIAGNOSIS — I10 Essential (primary) hypertension: Secondary | ICD-10-CM | POA: Diagnosis not present

## 2021-11-27 ENCOUNTER — Telehealth: Payer: Medicare HMO

## 2021-11-28 NOTE — Chronic Care Management (AMB) (Signed)
°  Care Management   Note  11/28/2021 Name: Jesus Lam MRN: [id number] DOB: 04/29/1933  Jesus Lam is a 88 y.o. year old male who is a primary care patient of [name], FNP and is actively engaged with the care management team. I reached out to [name] by phone today to assist with re-scheduling a follow up visit with the RN Case Manager  Follow up plan: Telephone appointment with care management team member scheduled for:12/13/21  [facility] Guide, Embedded Care Coordination [facility]   Care Management  Direct Dial: [phone]

## 2021-11-29 DIAGNOSIS — I10 Essential (primary) hypertension: Secondary | ICD-10-CM | POA: Diagnosis not present

## 2021-12-04 ENCOUNTER — Telehealth: Payer: Self-pay

## 2021-12-04 NOTE — Progress Notes (Signed)
° ° °  Chronic Care Management Pharmacy Assistant   Name: Jame Brimmer  MRN: [id number] DOB: 01/25/1933  Reason for Encounter: returned mail  Called Mr. Hepper daughter to get a updated address because his AVS was returned. Daughter VM is full.     Doreen Beam Clinical Pharmacist Assistant [phone]

## 2021-12-05 ENCOUNTER — Other Ambulatory Visit: Payer: Self-pay | Admitting: Cardiology

## 2021-12-13 ENCOUNTER — Telehealth: Payer: Medicare HMO

## 2021-12-13 ENCOUNTER — Ambulatory Visit (INDEPENDENT_AMBULATORY_CARE_PROVIDER_SITE_OTHER): Payer: Medicare HMO

## 2021-12-13 DIAGNOSIS — E0590 Thyrotoxicosis, unspecified without thyrotoxic crisis or storm: Secondary | ICD-10-CM

## 2021-12-13 DIAGNOSIS — G8929 Other chronic pain: Secondary | ICD-10-CM

## 2021-12-13 DIAGNOSIS — I10 Essential (primary) hypertension: Secondary | ICD-10-CM

## 2021-12-13 DIAGNOSIS — H6123 Impacted cerumen, bilateral: Secondary | ICD-10-CM

## 2021-12-13 DIAGNOSIS — R4182 Altered mental status, unspecified: Secondary | ICD-10-CM

## 2021-12-13 DIAGNOSIS — R413 Other amnesia: Secondary | ICD-10-CM

## 2021-12-13 DIAGNOSIS — E7800 Pure hypercholesterolemia, unspecified: Secondary | ICD-10-CM

## 2021-12-13 DIAGNOSIS — M25562 Pain in left knee: Secondary | ICD-10-CM

## 2021-12-13 NOTE — Patient Instructions (Signed)
Visit Information  Thank you for taking time to visit with me today. Please don't hesitate to contact me if I can be of assistance to you before our next scheduled telephone appointment.  Following are the goals we discussed today:  (Copy and paste patient goals from clinical care plan here)  Our next appointment is by telephone on 02/14/22 at 3:20PM  Please call the care guide team at [phone] if you need to cancel or reschedule your appointment.   If you are experiencing a Mental Health or Behavioral Health Crisis or need someone to talk to, please call 1-800-273-TALK (toll free, 24 hour hotline)   The patient verbalized understanding of instructions, educational materials, and care plan provided today and agreed to receive a mailed copy of patient instructions, educational materials, and care plan.    [name], RN, BSN, CCM Care Management Coordinator [facility] Care Management/[facility]  Direct Phone: [phone]

## 2021-12-13 NOTE — Chronic Care Management (AMB) (Signed)
Chronic Care Management   CCM RN Visit Note  12/13/2021 Name: Jesus Lam MRN: [id number] DOB: 1933-08-22  Subjective: Jesus Lam is a 88 y.o. year old male who is a primary care patient of Jesus Lam, [place]. The care management team was consulted for assistance with disease management and care coordination needs.    Engaged with patient by telephone for follow up visit in response to provider referral for case management and/or care coordination services.   Consent to Services:  The patient was given information about Chronic Care Management services, agreed to services, and gave verbal consent prior to initiation of services.  Please see initial visit note for detailed documentation.   Patient agreed to services and verbal consent obtained.   Assessment: Review of patient past medical history, allergies, medications, health status, including review of consultants reports, laboratory and other test data, was performed as part of comprehensive evaluation and provision of chronic care management services.   SDOH (Social Determinants of Health) assessments and interventions performed:    CCM Care Plan  Allergies  Allergen Reactions   Shellfish Allergy Other (See Comments)    Gout     Outpatient Encounter Medications as of 12/13/2021  Medication Sig   amLODipine (NORVASC) 5 MG tablet TAKE ONE TABLET BY MOUTH DAILY   atorvastatin (LIPITOR) 40 MG tablet TAKE TWO TABLETS BY MOUTH DAILY   azelastine (ASTELIN) 0.1 % nasal spray Place 2 sprays into both nostrils 2 (two) times daily. Use in each nostril as directed   bisoprolol (ZEBETA) 5 MG tablet TAKE HALF TABLET BY MOUTH DAILY   chlorthalidone (HYGROTON) 25 MG tablet Take 1 tablet (25 mg total) by mouth daily.   Cholecalciferol (VITAMIN D3) 5000 units CAPS Take 5,000 Units by mouth daily.   clopidogrel (PLAVIX) 75 MG tablet TAKE ONE TABLET BY MOUTH DAILY   donepezil (ARICEPT) 10 MG tablet TAKE 1 TABLET AT BEDTIME   Elastic  Bandages & Supports (MEDICAL COMPRESSION STOCKINGS) MISC    furosemide (LASIX) 20 MG tablet TAKE ONE TABLET BY MOUTH DAILY   furosemide (LASIX) 40 MG tablet Take 1 tablet (40 mg total) by mouth daily.   gabapentin (NEURONTIN) 300 MG capsule TAKE ONE CAPSULE BY MOUTH AT BEDTIME   hydrALAZINE (APRESOLINE) 25 MG tablet TAKE ONE TABLET BY MOUTH TWICE A DAY   icosapent Ethyl (VASCEPA) 1 g capsule TAKE ONE CAPSULE BY MOUTH TWICE A DAY   isosorbide mononitrate (IMDUR) 30 MG 24 hr tablet TAKE ONE TABLET BY MOUTH TWICE A DAY   ketorolac (ACULAR) 0.5 % ophthalmic solution ketorolac 0.5 % eye drops   levocetirizine (XYZAL) 5 MG tablet TAKE ONE TABLET BY MOUTH EVERY EVENING   Menthol-Methyl Salicylate (MUSCLE RUB) 10-15 % CREA muscle rub   methimazole (TAPAZOLE) 10 MG tablet TAKE ONE TABLET BY MOUTH DAILY   Multiple Vitamins-Minerals (COMPLETE SENIOR PO) 1 tablet daily.    tamsulosin (FLOMAX) 0.4 MG CAPS capsule TAKE ONE CAPSULE BY MOUTH DAILY HALF HOUR FOLLOWING THE SAME MEAL DAILY   vitamin B-12 (CYANOCOBALAMIN) 500 MCG tablet Take 500 mcg by mouth daily.   vitamin C (ASCORBIC ACID) 500 MG tablet Take 500 mg daily by mouth.   Facility-Administered Encounter Medications as of 12/13/2021  Medication   0.9 %  sodium chloride infusion    Patient Active Problem List   Diagnosis Date Noted   Leg edema 08/01/2021   Mild cognitive impairment 09/07/2020   Chronic pain of left knee 03/15/2020   Pure hypercholesterolemia 11/15/2019   First degree  AV block 07/28/2019   Hypertriglyceridemia 04/29/2019   Mixed hyperlipidemia 04/29/2019   CVA (cerebral vascular accident) ([place])    Bradycardia    Subclinical hyperthyroidism 11/18/2018   Peripheral artery disease ([place]) 06/13/2018   Ischemic cerebrovascular accident (CVA) ([place]) 06/13/2018   Dysarthria 06/12/2018   Renal insufficiency 06/12/2018   Claudication ([place]) 01/11/2018   Anemia 10/10/2017   Essential hypertension 11/04/2013    Conditions to be  addressed/monitored: HTN, Hyperthyroidism, Altered memory,  Bilateral hand pain, Medication management, Pure hypercholesterolemia, Chronic pain of left knee   Care Plan : RN Care Manager Plan of Care  Updates made by Lynne Logan, RN since 12/13/2021 12:00 AM     Problem: No Plan of Care established for management of chronic disease states (HTN, Hyperthyroidism, Altered memory,  Bilateral hand pain, Medication management, Pure hypercholesterolemia, Chronic pain of left knee)   Priority: High     Long-Range Goal: Establishment of plan of care for management of chronic disease states (HTN, Hyperthyroidism, Altered memory,  Bilateral hand pain, Medication management, Pure hypercholesterolemia, Chronic pain of left knee)   Start Date: 12/13/2021  Expected End Date: 12/13/2022  This Visit's Progress: On track  Priority: High  Note:   Current Barriers:  Knowledge Deficits related to plan of care for management of HTN, Hyperthyroidism, Altered memory,  Bilateral hand pain, Medication management, Pure hypercholesterolemia, Chronic pain of left knee   Chronic Disease Management support and education needs related to HTN, Hyperthyroidism, Altered memory,  Bilateral hand pain, Medication management, Pure hypercholesterolemia, Chronic pain of left knee    RNCM Clinical Goal(s):  Patient will verbalize basic understanding of  HTN, Hyperthyroidism, Altered memory,  Bilateral hand pain, Medication management, Pure hypercholesterolemia, Chronic pain of left knee  disease process and self health management plan as evidenced by patient will report having no disease exacerbations related to his chronic disease states listed above  take all medications exactly as prescribed and will call provider for medication related questions as evidenced by patient will demonstrate improved understanding of prescribed medications and rationale for usage as evidenced by patient teach back demonstrate Improved health management  independence as evidenced by patient will maintain current mobility and fuctionality continue to work with RN Care Manager to address care management and care coordination needs related to  HTN, Hyperthyroidism, Altered memory,  Bilateral hand pain, Medication management, Pure hypercholesterolemia, Chronic pain of left knee  as evidenced by adherence to CM Team Scheduled appointments through collaboration with RN Care manager, provider, and care team.   Interventions: 1:1 collaboration with primary care provider regarding development and update of comprehensive plan of care as evidenced by provider attestation and co-signature Inter-disciplinary care team collaboration (see longitudinal plan of care) Evaluation of current treatment plan related to  self management and patient's adherence to plan as established by provider   Chronic Kidney Disease Interventions:  (Status:  Goal on track:  Yes.) Long Term Goal Assessed the Patient understanding of chronic kidney disease    Evaluation of current treatment plan related to chronic kidney disease self management and patient's adherence to plan as established by provider      Reviewed prescribed diet increase daily water intake to 64 oz  Discussed the impact of chronic kidney disease on daily life and mental health and acknowledged and normalized feelings of disempowerment, fear, and frustration    Provided education on kidney disease progression    Determined patient is experiencing urinary frequency with nocturia, denies other symptoms but states this is a  new problem that started about 1 month ago Educated patient on s/s suggestive UTI, educated on other potential causes and complications if left undiagnosed/treated  Determined patient feels he is staying well hydrated as evidence by patient is drinking 4-16.9 oz of bottled water daily  Encouraged patient to schedule a PCP visit for further evaluation/treatment, placed patient on PCP schedule for  12/17/21 @3 :00 PM, daughter will provide transportation  Discussed plans with patient for ongoing care management follow up and provided patient with direct contact information for care management team Last practice recorded BP readings:  BP Readings from Last 3 Encounters:  11/08/21 123/66  08/15/21 (!) 166/74  08/01/21 (!) 159/70  Most recent eGFR/CrCl:  Lab Results  Component Value Date   EGFR 59 (L) 08/23/2021    No components found for: CRCL   Rheumatoid Arthritis Interventions:  (Status:  Condition stable.  Not addressed this visit.)  Long Term Goal Evaluation of current treatment plan related to  Rheumatoid Arthritis , self-management and patient's adherence to plan as established by provider Provided education to patient about basic disease process related to RA Review of patient status, including review of consultant's reports, relevant laboratory and other test results, and medications completed. Reviewed medications with patient and discussed importance of medication adherence Discussed plans with patient for ongoing care management follow up and provided patient with direct contact information for care management team  Hyperlipidemia Interventions:  (Status:  Condition stable.  Not addressed this visit.) Long Term Goal Evaluation of current treatment plan related to Hyperlipidemia, self-management and patient's adherence to plan as established by provider Provided education to patient about basic disease process related to Hyperlipidemia Review of patient status, including review of consultant's reports, relevant laboratory and other test results, and medications completed. Reviewed medications with patient and discussed importance of medication adherence Discussed plans with patient for ongoing care management follow up and provided patient with direct contact information for care management team Lipid Panel     Component Value Date/Time   CHOL 124 02/08/2021 1529   TRIG  203 (H) 02/08/2021 1529   HDL 32 (L) 02/08/2021 1529   CHOLHDL 3.9 02/08/2021 1529   CHOLHDL 4.4 06/13/2018 1018   VLDL 18 06/13/2018 1018   LDLCALC 59 02/08/2021 1529   LABVLDL 33 02/08/2021 1529   Dementia Interventions:  (Status:  Condition stable.  Not addressed this visit.)  Long Term Goal Evaluation of current treatment plan related to memory loss, self-management and patient's adherence to plan as established by provider Provided education to patient about basic disease process related to Dementia  Review of patient status, including review of consultant's reports, relevant laboratory and other test results, and medications completed. Reviewed medications with patient and discussed importance of medication adherence Discussed plans with patient for ongoing care management follow up and provided patient with direct contact information for care management team  Patient Goals/Self-Care Activities: Take all medications as prescribed Attend all scheduled provider appointments Call pharmacy for medication refills 3-7 days in advance of running out of medications Perform all self care activities independently  Call provider office for new concerns or questions   Follow Up Plan:  Telephone follow up appointment with care management team member scheduled for:  02/14/22      Plan:Telephone follow up appointment with care management team member scheduled for:  02/14/22  Barb Merino, RN, BSN, CCM Care Management Coordinator [place] Management/[facility]  Direct Phone: [phone]

## 2021-12-17 ENCOUNTER — Ambulatory Visit: Payer: Medicare HMO | Admitting: Nurse Practitioner

## 2021-12-21 ENCOUNTER — Telehealth: Payer: Medicare HMO

## 2021-12-21 ENCOUNTER — Ambulatory Visit: Payer: Self-pay

## 2021-12-21 DIAGNOSIS — R4182 Altered mental status, unspecified: Secondary | ICD-10-CM

## 2021-12-21 DIAGNOSIS — E7800 Pure hypercholesterolemia, unspecified: Secondary | ICD-10-CM

## 2021-12-21 DIAGNOSIS — I10 Essential (primary) hypertension: Secondary | ICD-10-CM

## 2021-12-21 DIAGNOSIS — G8929 Other chronic pain: Secondary | ICD-10-CM

## 2021-12-21 DIAGNOSIS — E0590 Thyrotoxicosis, unspecified without thyrotoxic crisis or storm: Secondary | ICD-10-CM

## 2021-12-21 DIAGNOSIS — H6123 Impacted cerumen, bilateral: Secondary | ICD-10-CM

## 2021-12-21 NOTE — Chronic Care Management (AMB) (Signed)
Chronic Care Management   CCM RN Visit Note  12/21/2021 Name: Jesus Lam MRN: [id number] DOB: September 26, 1933  Subjective: Jesus Lam is a 88 y.o. year old male who is a primary care patient of Minette Brine, [place]. The care management team was consulted for assistance with disease management and care coordination needs.    Engaged with patient by telephone for follow up visit in response to provider referral for case management and/or care coordination services.   Consent to Services:  The patient was given information about Chronic Care Management services, agreed to services, and gave verbal consent prior to initiation of services.  Please see initial visit note for detailed documentation.   Patient agreed to services and verbal consent obtained.   Assessment: Review of patient past medical history, allergies, medications, health status, including review of consultants reports, laboratory and other test data, was performed as part of comprehensive evaluation and provision of chronic care management services.   SDOH (Social Determinants of Health) assessments and interventions performed:  Yes, no acute changes  CCM Care Plan  Allergies  Allergen Reactions   Shellfish Allergy Other (See Comments)    Gout     Outpatient Encounter Medications as of 12/21/2021  Medication Sig   amLODipine (NORVASC) 5 MG tablet TAKE ONE TABLET BY MOUTH DAILY   atorvastatin (LIPITOR) 40 MG tablet TAKE TWO TABLETS BY MOUTH DAILY   azelastine (ASTELIN) 0.1 % nasal spray Place 2 sprays into both nostrils 2 (two) times daily. Use in each nostril as directed   bisoprolol (ZEBETA) 5 MG tablet TAKE HALF TABLET BY MOUTH DAILY   chlorthalidone (HYGROTON) 25 MG tablet Take 1 tablet (25 mg total) by mouth daily.   Cholecalciferol (VITAMIN D3) 5000 units CAPS Take 5,000 Units by mouth daily.   clopidogrel (PLAVIX) 75 MG tablet TAKE ONE TABLET BY MOUTH DAILY   donepezil (ARICEPT) 10 MG tablet TAKE 1 TABLET AT  BEDTIME   Elastic Bandages & Supports (MEDICAL COMPRESSION STOCKINGS) MISC    furosemide (LASIX) 20 MG tablet TAKE ONE TABLET BY MOUTH DAILY   furosemide (LASIX) 40 MG tablet Take 1 tablet (40 mg total) by mouth daily.   gabapentin (NEURONTIN) 300 MG capsule TAKE ONE CAPSULE BY MOUTH AT BEDTIME   hydrALAZINE (APRESOLINE) 25 MG tablet TAKE ONE TABLET BY MOUTH TWICE A DAY   icosapent Ethyl (VASCEPA) 1 g capsule TAKE ONE CAPSULE BY MOUTH TWICE A DAY   isosorbide mononitrate (IMDUR) 30 MG 24 hr tablet TAKE ONE TABLET BY MOUTH TWICE A DAY   ketorolac (ACULAR) 0.5 % ophthalmic solution ketorolac 0.5 % eye drops   levocetirizine (XYZAL) 5 MG tablet TAKE ONE TABLET BY MOUTH EVERY EVENING   Menthol-Methyl Salicylate (MUSCLE RUB) 10-15 % CREA muscle rub   methimazole (TAPAZOLE) 10 MG tablet TAKE ONE TABLET BY MOUTH DAILY   Multiple Vitamins-Minerals (COMPLETE SENIOR PO) 1 tablet daily.    tamsulosin (FLOMAX) 0.4 MG CAPS capsule TAKE ONE CAPSULE BY MOUTH DAILY HALF HOUR FOLLOWING THE SAME MEAL DAILY   vitamin B-12 (CYANOCOBALAMIN) 500 MCG tablet Take 500 mcg by mouth daily.   vitamin C (ASCORBIC ACID) 500 MG tablet Take 500 mg daily by mouth.   Facility-Administered Encounter Medications as of 12/21/2021  Medication   0.9 %  sodium chloride infusion    Patient Active Problem List   Diagnosis Date Noted   Leg edema 08/01/2021   Mild cognitive impairment 09/07/2020   Chronic pain of left knee 03/15/2020   Pure hypercholesterolemia 11/15/2019  First degree AV block 07/28/2019   Hypertriglyceridemia 04/29/2019   Mixed hyperlipidemia 04/29/2019   CVA (cerebral vascular accident) ([place])    Bradycardia    Subclinical hyperthyroidism 11/18/2018   Peripheral artery disease ([place]) 06/13/2018   Ischemic cerebrovascular accident (CVA) ([place]) 06/13/2018   Dysarthria 06/12/2018   Renal insufficiency 06/12/2018   Claudication ([place]) 01/11/2018   Anemia 10/10/2017   Essential hypertension 11/04/2013     Conditions to be addressed/monitored: HTN, Hyperthyroidism, Altered memory,  Bilateral hand pain, Medication management, Pure hypercholesterolemia, Chronic pain of left knee   Care Plan : RN Care Manager Plan of Care  Updates made by Lynne Logan, RN since 12/21/2021 12:00 AM     Problem: No Plan of Care established for management of chronic disease states (HTN, Hyperthyroidism, Altered memory,  Bilateral hand pain, Medication management, Pure hypercholesterolemia, Chronic pain of left knee)   Priority: High     Long-Range Goal: Establishment of plan of care for management of chronic disease states (HTN, Hyperthyroidism, Altered memory,  Bilateral hand pain, Medication management, Pure hypercholesterolemia, Chronic pain of left knee)   Start Date: 12/13/2021  Expected End Date: 12/13/2022  Recent Progress: On track  Priority: High  Note:   Current Barriers:  Knowledge Deficits related to plan of care for management of HTN, Hyperthyroidism, Altered memory,  Bilateral hand pain, Medication management, Pure hypercholesterolemia, Chronic pain of left knee   Chronic Disease Management support and education needs related to HTN, Hyperthyroidism, Altered memory,  Bilateral hand pain, Medication management, Pure hypercholesterolemia, Chronic pain of left knee    RNCM Clinical Goal(s):  Patient will verbalize basic understanding of  HTN, Hyperthyroidism, Altered memory,  Bilateral hand pain, Medication management, Pure hypercholesterolemia, Chronic pain of left knee  disease process and self health management plan as evidenced by patient will report having no disease exacerbations related to his chronic disease states listed above  take all medications exactly as prescribed and will call provider for medication related questions as evidenced by patient will demonstrate improved understanding of prescribed medications and rationale for usage as evidenced by patient teach back demonstrate Improved  health management independence as evidenced by patient will maintain current mobility and fuctionality continue to work with RN Care Manager to address care management and care coordination needs related to  HTN, Hyperthyroidism, Altered memory,  Bilateral hand pain, Medication management, Pure hypercholesterolemia, Chronic pain of left knee  as evidenced by adherence to CM Team Scheduled appointments through collaboration with RN Care manager, provider, and care team.   Interventions: 1:1 collaboration with primary care provider regarding development and update of comprehensive plan of care as evidenced by provider attestation and co-signature Inter-disciplinary care team collaboration (see longitudinal plan of care) Evaluation of current treatment plan related to  self management and patient's adherence to plan as established by provider   Chronic Kidney Disease Interventions:  (Status:  Goal on track:  Yes.) Long Term Goal Assessed the Patient understanding of chronic kidney disease    Evaluation of current treatment plan related to chronic kidney disease self management and patient's adherence to plan as established by provider      Reviewed prescribed diet increase daily water intake to 64 oz  Discussed the impact of chronic kidney disease on daily life and mental health and acknowledged and normalized feelings of disempowerment, fear, and frustration    Provided education on kidney disease progression    Determined patient is experiencing urinary frequency with nocturia, denies other symptoms but states this is  a new problem that started about 1 month ago Educated patient on s/s suggestive UTI, educated on other potential causes and complications if left undiagnosed/treated  Determined patient feels he is staying well hydrated as evidence by patient is drinking 4-16.9 oz of bottled water daily  Encouraged patient to schedule a PCP visit for further evaluation/treatment, placed patient on PCP  schedule for 12/17/21 $RemoveBef'@[id number]'[id number]$ :00 PM, daughter will provide transportation  Discussed plans with patient for ongoing care management follow up and provided patient with direct contact information for care management team Last practice recorded BP readings:  BP Readings from Last 3 Encounters:  11/08/21 123/66  08/15/21 (!) 166/74  08/01/21 (!) 159/70  Most recent eGFR/CrCl:  Lab Results  Component Value Date   EGFR 59 (L) 08/23/2021    No components found for: CRCL Placed outbound successful call to patient to f/u on PCP evaluation scheduled for 12/17/21 for urinary frequency as discussed at last RN CM follow up Determined patient missed the appointment but continues to experience urinary frequency and would like to reschedule his appointment for the next available Placed patient on PCP schedule with Minette Brine, FNP for next available, Monday 12/24/21 $RemoveBefore'@4'[id number]$ :00 PM Discussed with patient the importance of patient keeping all MD scheduled appointments, patient verbalizes understanding and states he will keep this appointment Discussed plans with patient for ongoing care management follow up and provided patient with direct contact information for care management team  Rheumatoid Arthritis Interventions:  (Status:  Condition stable.  Not addressed this visit.)  Long Term Goal Evaluation of current treatment plan related to  Rheumatoid Arthritis , self-management and patient's adherence to plan as established by provider Provided education to patient about basic disease process related to RA Review of patient status, including review of consultant's reports, relevant laboratory and other test results, and medications completed. Reviewed medications with patient and discussed importance of medication adherence Discussed plans with patient for ongoing care management follow up and provided patient with direct contact information for care management team  Hyperlipidemia Interventions:  (Status:  Condition  stable.  Not addressed this visit.) Long Term Goal Evaluation of current treatment plan related to Hyperlipidemia, self-management and patient's adherence to plan as established by provider Provided education to patient about basic disease process related to Hyperlipidemia Review of patient status, including review of consultant's reports, relevant laboratory and other test results, and medications completed. Reviewed medications with patient and discussed importance of medication adherence Discussed plans with patient for ongoing care management follow up and provided patient with direct contact information for care management team Lipid Panel     Component Value Date/Time   CHOL 124 02/08/2021 1529   TRIG 203 (H) 02/08/2021 1529   HDL 32 (L) 02/08/2021 1529   CHOLHDL 3.9 02/08/2021 1529   CHOLHDL 4.4 06/13/2018 1018   VLDL 18 06/13/2018 1018   LDLCALC 59 02/08/2021 1529   LABVLDL 33 02/08/2021 1529   Dementia Interventions:  (Status:  Condition stable.  Not addressed this visit.)  Long Term Goal Evaluation of current treatment plan related to memory loss, self-management and patient's adherence to plan as established by provider Provided education to patient about basic disease process related to Dementia  Review of patient status, including review of consultant's reports, relevant laboratory and other test results, and medications completed. Reviewed medications with patient and discussed importance of medication adherence Discussed plans with patient for ongoing care management follow up and provided patient with direct contact information for care management team  Patient  Goals/Self-Care Activities: Take all medications as prescribed Attend all scheduled provider appointments Call pharmacy for medication refills 3-7 days in advance of running out of medications Perform all self care activities independently  Call provider office for new concerns or questions   Follow Up Plan:   Telephone follow up appointment with care management team member scheduled for:  12/26/21      Plan:Telephone follow up appointment with care management team member scheduled for:  12/26/21  Barb Merino, RN, BSN, CCM Care Management Coordinator [place] [facility]  Direct Phone: [phone]

## 2021-12-21 NOTE — Patient Instructions (Signed)
Visit Information  Thank you for taking time to visit with me today. Please don't hesitate to contact me if I can be of assistance to you before our next scheduled telephone appointment.  Following are the goals we discussed today:  (Copy and paste patient goals from clinical care plan here)  Our next appointment is by telephone on 12/26/21 at 9:00 AM  Please call the care guide team at [phone] if you need to cancel or reschedule your appointment.   If you are experiencing a Mental Health or Behavioral Health Crisis or need someone to talk to, please call 1-800-273-TALK (toll free, 24 hour hotline)   The patient verbalized understanding of instructions, educational materials, and care plan provided today and agreed to receive a mailed copy of patient instructions, educational materials, and care plan.   [name], RN, BSN, CCM Care Management Coordinator [facility]  Direct Phone: [phone]

## 2021-12-22 ENCOUNTER — Other Ambulatory Visit: Payer: Self-pay | Admitting: Cardiology

## 2021-12-24 ENCOUNTER — Ambulatory Visit (INDEPENDENT_AMBULATORY_CARE_PROVIDER_SITE_OTHER): Payer: Medicare HMO | Admitting: Nurse Practitioner

## 2021-12-24 ENCOUNTER — Other Ambulatory Visit: Payer: Self-pay

## 2021-12-24 VITALS — BP 122/60 | HR 55 | Temp 98.6°F | Ht 72.0 in | Wt 239.0 lb

## 2021-12-24 DIAGNOSIS — E0590 Thyrotoxicosis, unspecified without thyrotoxic crisis or storm: Secondary | ICD-10-CM

## 2021-12-24 DIAGNOSIS — I10 Essential (primary) hypertension: Secondary | ICD-10-CM | POA: Diagnosis not present

## 2021-12-24 DIAGNOSIS — Z23 Encounter for immunization: Secondary | ICD-10-CM

## 2021-12-24 DIAGNOSIS — E6609 Other obesity due to excess calories: Secondary | ICD-10-CM

## 2021-12-24 DIAGNOSIS — R6889 Other general symptoms and signs: Secondary | ICD-10-CM | POA: Diagnosis not present

## 2021-12-24 DIAGNOSIS — Z9989 Dependence on other enabling machines and devices: Secondary | ICD-10-CM

## 2021-12-24 DIAGNOSIS — R350 Frequency of micturition: Secondary | ICD-10-CM | POA: Diagnosis not present

## 2021-12-24 DIAGNOSIS — E7800 Pure hypercholesterolemia, unspecified: Secondary | ICD-10-CM

## 2021-12-24 DIAGNOSIS — Z6832 Body mass index (BMI) 32.0-32.9, adult: Secondary | ICD-10-CM

## 2021-12-24 DIAGNOSIS — R351 Nocturia: Secondary | ICD-10-CM

## 2021-12-24 DIAGNOSIS — H903 Sensorineural hearing loss, bilateral: Secondary | ICD-10-CM | POA: Diagnosis not present

## 2021-12-24 LAB — POCT URINALYSIS DIPSTICK
Bilirubin, UA: NEGATIVE
Blood, UA: NEGATIVE
Glucose, UA: NEGATIVE
Ketones, UA: NEGATIVE
Nitrite, UA: NEGATIVE
Protein, UA: NEGATIVE
Spec Grav, UA: 1.015 (ref 1.010–1.025)
Urobilinogen, UA: 0.2 E.U./dL
pH, UA: 5.5 (ref 5.0–8.0)

## 2021-12-24 MED ORDER — TETANUS-DIPHTH-ACELL PERTUSSIS 5-2.5-18.5 LF-MCG/0.5 IM SUSP: 0.5000 mL | Freq: Once | INTRAMUSCULAR | 0 refills | Status: AC

## 2021-12-24 MED ORDER — MIRABEGRON ER 25 MG PO TB24: 25.0000 mg | ORAL_TABLET | Freq: Every day | ORAL | 2 refills | Status: DC

## 2021-12-24 NOTE — Progress Notes (Signed)
I,Tianna Badgett,acting as a Education administrator for [employer], FNP.,have documented all relevant documentation on the behalf of Minette Brine, FNP,as directed by  Minette Brine, FNP while in the presence of Minette Brine, [place].  This visit occurred during the SARS-CoV-2 public health emergency. Safety protocols were in place, including screening questions prior to the visit, additional usage of staff PPE, and extensive cleaning of exam room while observing appropriate contact time as indicated for disinfecting solutions.  Subjective:     Patient ID: Jesus Lam , male    DOB: 1933-04-15 , 88 y.o.   MRN: [id number]   Chief Complaint  Patient presents with   Urinary Frequency    HPI  Patient is here today for urinary frequency.   Urinary Frequency  This is a new problem. The current episode started more than 1 month ago (6 months). The patient is experiencing no pain. He is Not sexually active. There is No history of pyelonephritis. Associated symptoms include frequency.    Past Medical History:  Diagnosis Date   Anemia    BPH (benign prostatic hyperplasia)    Cataracts, bilateral    CVA (cerebral vascular accident) ([place])    DVT (deep venous thrombosis) ([facility])    dvt in left leg   Dyslipidemia    Gout    Gout    HTN (hypertension)    Left hip pain 03/15/2020   Left leg pain    Osteoarthritis    PAD (peripheral artery disease) ([facility])    Stasis dermatitis    Stroke ([place])    Vitamin D deficiency      Family History  Problem Relation Age of Onset   Cancer Mother    Cancer Father    Alcohol abuse Father    Breast cancer Daughter    Stroke Daughter    CVA Daughter      Current Outpatient Medications:    amLODipine (NORVASC) 5 MG tablet, TAKE ONE TABLET BY MOUTH DAILY, Disp: 90 tablet, Rfl: 7   atorvastatin (LIPITOR) 40 MG tablet, TAKE TWO TABLETS BY MOUTH DAILY, Disp: 90 tablet, Rfl: 4   azelastine (ASTELIN) 0.1 % nasal spray, Place 2 sprays into both nostrils 2 (two) times daily.  Use in each nostril as directed, Disp: 30 mL, Rfl: 5   chlorthalidone (HYGROTON) 25 MG tablet, Take 1 tablet (25 mg total) by mouth daily., Disp: 5 tablet, Rfl: 0   Cholecalciferol (VITAMIN D3) 5000 units CAPS, Take 5,000 Units by mouth daily., Disp: , Rfl:    clopidogrel (PLAVIX) 75 MG tablet, TAKE ONE TABLET BY MOUTH DAILY, Disp: 90 tablet, Rfl: 3   donepezil (ARICEPT) 10 MG tablet, TAKE 1 TABLET AT BEDTIME, Disp: 90 tablet, Rfl: 1   Elastic Bandages & Supports (MEDICAL COMPRESSION STOCKINGS) MISC, , Disp: , Rfl:    furosemide (LASIX) 20 MG tablet, TAKE ONE TABLET BY MOUTH DAILY, Disp: 90 tablet, Rfl: 4   furosemide (LASIX) 40 MG tablet, Take 1 tablet (40 mg total) by mouth daily., Disp: 60 tablet, Rfl: 3   gabapentin (NEURONTIN) 300 MG capsule, TAKE ONE CAPSULE BY MOUTH AT BEDTIME, Disp: 90 capsule, Rfl: 0   hydrALAZINE (APRESOLINE) 25 MG tablet, TAKE ONE TABLET BY MOUTH TWICE A DAY, Disp: 60 tablet, Rfl: 0   icosapent Ethyl (VASCEPA) 1 g capsule, TAKE ONE CAPSULE BY MOUTH TWICE A DAY, Disp: 60 capsule, Rfl: 0   isosorbide mononitrate (IMDUR) 30 MG 24 hr tablet, TAKE ONE TABLET BY MOUTH TWICE A DAY, Disp: 60 tablet, Rfl: 0  ketorolac (ACULAR) 0.5 % ophthalmic solution, ketorolac 0.5 % eye drops, Disp: , Rfl:    levocetirizine (XYZAL) 5 MG tablet, TAKE ONE TABLET BY MOUTH EVERY EVENING, Disp: 90 tablet, Rfl: 4   Menthol-Methyl Salicylate (MUSCLE RUB) 10-15 % CREA, muscle rub, Disp: , Rfl:    methimazole (TAPAZOLE) 10 MG tablet, TAKE ONE TABLET BY MOUTH DAILY, Disp: 30 tablet, Rfl: 0   mirabegron ER (MYRBETRIQ) 25 MG TB24 tablet, Take 1 tablet (25 mg total) by mouth daily., Disp: 30 tablet, Rfl: 2   Multiple Vitamins-Minerals (COMPLETE SENIOR PO), 1 tablet daily. , Disp: , Rfl:    tamsulosin (FLOMAX) 0.4 MG CAPS capsule, TAKE ONE CAPSULE BY MOUTH DAILY HALF HOUR FOLLOWING THE SAME MEAL DAILY, Disp: 90 capsule, Rfl: 4   vitamin B-12 (CYANOCOBALAMIN) 500 MCG tablet, Take 500 mcg by mouth daily.,  Disp: , Rfl:    vitamin C (ASCORBIC ACID) 500 MG tablet, Take 500 mg daily by mouth., Disp: , Rfl:    bisoprolol (ZEBETA) 5 MG tablet, TAKE HALF TABLET BY MOUTH DAILY, Disp: 60 tablet, Rfl: 0   doxycycline (MONODOX) 100 MG capsule, Take 1 capsule (100 mg total) by mouth 2 (two) times daily., Disp: 14 capsule, Rfl: 0  Current Facility-Administered Medications:    0.9 %  sodium chloride infusion, , Intravenous, PRN, Ghumman, Ramandeep, NP   Allergies  Allergen Reactions   Shellfish Allergy Other (See Comments)    Gout      Review of Systems  Constitutional: Negative.   Respiratory: Negative.    Cardiovascular: Negative.   Gastrointestinal: Negative.   Genitourinary:  Positive for frequency.  Neurological: Negative.     Today's Vitals   12/24/21 1611  BP: 122/60  Pulse: (!) 55  Temp: 98.6 F (37 C)  TempSrc: Oral  Weight: 239 lb (108.4 kg)  Height: 6' (1.829 m)   Body mass index is 32.41 kg/m.  Wt Readings from Last 3 Encounters:  12/24/21 239 lb (108.4 kg)  08/15/21 239 lb (108.4 kg)  08/01/21 236 lb (107 kg)    Objective:  Physical Exam Vitals reviewed.  Constitutional:      General: He is not in acute distress.    Appearance: Normal appearance. He is obese.  HENT:     Ears:     Comments: TM normal after cerumen removal Cardiovascular:     Rate and Rhythm: Normal rate and regular rhythm.     Pulses: Normal pulses.     Heart sounds: Normal heart sounds. No murmur heard. Pulmonary:     Effort: Pulmonary effort is normal. No respiratory distress.     Breath sounds: Normal breath sounds. No wheezing.  Genitourinary:    Comments: His pants are wet in the front.  Musculoskeletal:     Comments: Uses cane for ambulation  Skin:    General: Skin is warm.     Capillary Refill: Capillary refill takes less than 2 seconds.     Comments: Left 4th phalange with hard raised wart present  Neurological:     General: No focal deficit present.     Mental Status: He is  alert and oriented to person, place, and time.     Cranial Nerves: No cranial nerve deficit.     Motor: No weakness.  Psychiatric:        Mood and Affect: Mood normal.        Behavior: Behavior normal.        Thought Content: Thought content normal.  Judgment: Judgment normal.        Assessment And Plan:     1. Urinary frequency Comments: Trace white cells in urinalysis, will check PSA and send urine for culture.  - POCT Urinalysis Dipstick (81002) - Culture, Urine - Ambulatory referral to Urology - PSA - Hemoglobin A1c - mirabegron ER (MYRBETRIQ) 25 MG TB24 tablet; Take 1 tablet (25 mg total) by mouth daily.  Dispense: 30 tablet; Refill: 2  2. Nocturia Comments: Reports symptoms are worsening, I will check PSA levels and change medication to myrbetriq - PSA - mirabegron ER (MYRBETRIQ) 25 MG TB24 tablet; Take 1 tablet (25 mg total) by mouth daily.  Dispense: 30 tablet; Refill: 2  3. Essential hypertension Comments: Blood pressure is well controlled. Contnue current medications - BMP8+EGFR  4. Hyperthyroidism Comments: He had been followed by Endocrinology but has not been in some time. Will recheck his levels - TSH - T4 - T3, free  5. Pure hypercholesterolemia Comments: Stable, tolerating statin well.  - Lipid panel  6. Class 1 obesity due to excess calories with serious comorbidity and body mass index (BMI) of 32.0 to 32.9 in adult He is encouraged to initially strive for BMI less than 30 to decrease cardiac risk. He is advised to exercise no less than 150 minutes per week.    7. Encounter for immunization Rx sent to pharmacy - Tdap ([place]) 5-2.5-18.5 LF-MCG/0.5 injection; Inject 0.5 mLs into the muscle once for 1 dose.  Dispense: 0.5 mL; Refill: 0    Patient was given opportunity to ask questions. Patient verbalized understanding of the plan and was able to repeat key elements of the plan. All questions were answered to their satisfaction.  Minette Brine,  FNP   I, Minette Brine, FNP, have reviewed all documentation for this visit. The documentation on 12/24/21 for the exam, diagnosis, procedures, and orders are all accurate and complete.   IF YOU HAVE BEEN REFERRED TO A SPECIALIST, IT MAY TAKE 1-2 WEEKS TO SCHEDULE/PROCESS THE REFERRAL. IF YOU HAVE NOT HEARD FROM US/SPECIALIST IN TWO WEEKS, PLEASE GIVE [place] A CALL AT [phone] X [phone].   THE PATIENT IS ENCOURAGED TO PRACTICE SOCIAL DISTANCING DUE TO THE COVID-19 PANDEMIC.

## 2021-12-24 NOTE — Patient Instructions (Signed)
Urinary Frequency, Adult ?Urinary frequency means urinating more often than usual. You may urinate every 1-2 hours even though you drink a normal amount of fluid and do not have a bladder infection or condition. Although you urinate more often than normal, the total amount of urine produced in a day is normal. ?With urinary frequency, you may have an urgent need to urinate often. The stress and anxiety of needing to find a bathroom quickly can make this urge worse. This condition may go away on its own, or you may need treatment at home. Home treatment may include bladder training, exercises, taking medicines, or making changes to your diet. ?Follow these instructions at home: ?Bladder health ?Your health care provider will tell you what to do to improve bladder health. You may be told to: ?Keep a bladder diary. Keep track of: ?What you eat and drink. ?How often you urinate. ?How much you urinate. ?Follow a bladder training program. This may include: ?Learning to delay going to the bathroom. ?Double urinating, also called voiding. This helps if you are not completely emptying your bladder. ?Scheduled voiding. ?Do Kegel exercises. Kegel exercises strengthen the muscles that help control urination, which may help the condition. ? ?Eating and drinking ?Follow instructions from your health care provider about eating or drinking restrictions. You may be told to: ?Avoid caffeine. ?Drink fewer fluids, especially alcohol. ?Avoid drinking in the evening. ?Avoid foods or drinks that may irritate the bladder. These include coffee, tea, soda, artificial sweeteners, citrus, tomato-based foods, and chocolate. ?Eat foods that help prevent or treat constipation. Constipation can make urinary frequency worse. You may need to take these actions to prevent or treat constipation: ?Drink enough fluid to keep your urine pale yellow. ?Take over-the-counter or prescription medicines. ?Eat foods that are high in fiber, such as beans, whole  grains, and fresh fruits and vegetables. ?Limit foods that are high in fat and processed sugars, such as fried or sweet foods. ?General instructions ?Take over-the-counter and prescription medicines only as told by your health care provider. ?Keep all follow-up visits. This is important. ?Contact a health care provider if: ?You start urinating more often. ?You feel pain or irritation when you urinate. ?You notice blood in your urine. ?Your urine looks cloudy. ?You develop a fever. ?You begin vomiting. ?Get help right away if: ?You are unable to urinate. ?Summary ?Urinary frequency means urinating more often than usual. With urinary frequency, you may urinate every 1-2 hours even though you drink a normal amount of fluid and do not have a bladder infection or other bladder condition. ?Your health care provider may recommend that you keep a bladder diary, follow a bladder training program, or make dietary changes. ?If told by your health care provider, do Kegel exercises to strengthen the muscles that help control urination. ?Take over-the-counter and prescription medicines only as told by your health care provider. ?Contact a health care provider if your symptoms do not improve or get worse. ?This information is not intended to replace advice given to you by your health care provider. Make sure you discuss any questions you have with your health care provider. ?Document Revised: 06/16/2020 Document Reviewed: 06/16/2020 ?Elsevier Patient Education ? 2022 [employer] ? ?

## 2021-12-25 DIAGNOSIS — I10 Essential (primary) hypertension: Secondary | ICD-10-CM

## 2021-12-25 DIAGNOSIS — E7800 Pure hypercholesterolemia, unspecified: Secondary | ICD-10-CM

## 2021-12-25 LAB — HEMOGLOBIN A1C
Est. average glucose Bld gHb Est-mCnc: 108 mg/dL
Hgb A1c MFr Bld: 5.4 % (ref 4.8–5.6)

## 2021-12-25 LAB — BMP8+EGFR
BUN/Creatinine Ratio: 16 (ref 10–24)
BUN: 21 mg/dL (ref 8–27)
CO2: 24 mmol/L (ref 20–29)
Calcium: 9.2 mg/dL (ref 8.6–10.2)
Chloride: 108 mmol/L — ABNORMAL HIGH (ref 96–106)
Creatinine, Ser: 1.28 mg/dL — ABNORMAL HIGH (ref 0.76–1.27)
Glucose: 81 mg/dL (ref 70–99)
Potassium: 4.8 mmol/L (ref 3.5–5.2)
Sodium: 145 mmol/L — ABNORMAL HIGH (ref 134–144)
eGFR: 54 mL/min/{1.73_m2} — ABNORMAL LOW (ref >59)

## 2021-12-25 LAB — LIPID PANEL
Chol/HDL Ratio: 3.1 ratio (ref 0.0–5.0)
Cholesterol, Total: 111 mg/dL (ref 100–199)
HDL: 36 mg/dL — ABNORMAL LOW (ref >39)
LDL Chol Calc (NIH): 51 mg/dL (ref 0–99)
Triglycerides: 135 mg/dL (ref 0–149)
VLDL Cholesterol Cal: 24 mg/dL (ref 5–40)

## 2021-12-25 LAB — T3, FREE: T3, Free: 3.3 pg/mL (ref 2.0–4.4)

## 2021-12-25 LAB — PSA: Prostate Specific Ag, Serum: 8.5 ng/mL — ABNORMAL HIGH (ref 0.0–4.0)

## 2021-12-25 LAB — TSH: TSH: 0.056 u[IU]/mL — ABNORMAL LOW (ref 0.450–4.500)

## 2021-12-25 LAB — T4: T4, Total: 8.8 ug/dL (ref 4.5–12.0)

## 2021-12-26 ENCOUNTER — Other Ambulatory Visit: Payer: Self-pay | Admitting: Nurse Practitioner

## 2021-12-26 ENCOUNTER — Ambulatory Visit (INDEPENDENT_AMBULATORY_CARE_PROVIDER_SITE_OTHER): Payer: Medicare HMO

## 2021-12-26 ENCOUNTER — Telehealth: Payer: Medicare HMO

## 2021-12-26 DIAGNOSIS — R4182 Altered mental status, unspecified: Secondary | ICD-10-CM

## 2021-12-26 DIAGNOSIS — I10 Essential (primary) hypertension: Secondary | ICD-10-CM

## 2021-12-26 DIAGNOSIS — R350 Frequency of micturition: Secondary | ICD-10-CM

## 2021-12-26 DIAGNOSIS — E7800 Pure hypercholesterolemia, unspecified: Secondary | ICD-10-CM

## 2021-12-26 DIAGNOSIS — R972 Elevated prostate specific antigen [PSA]: Secondary | ICD-10-CM

## 2021-12-26 DIAGNOSIS — G8929 Other chronic pain: Secondary | ICD-10-CM

## 2021-12-26 DIAGNOSIS — M79641 Pain in right hand: Secondary | ICD-10-CM

## 2021-12-26 DIAGNOSIS — E0590 Thyrotoxicosis, unspecified without thyrotoxic crisis or storm: Secondary | ICD-10-CM

## 2021-12-26 DIAGNOSIS — R32 Unspecified urinary incontinence: Secondary | ICD-10-CM

## 2021-12-26 DIAGNOSIS — R6889 Other general symptoms and signs: Secondary | ICD-10-CM | POA: Diagnosis not present

## 2021-12-26 LAB — URINE CULTURE: Organism ID, Bacteria: NO GROWTH

## 2021-12-26 MED ORDER — DOXYCYCLINE MONOHYDRATE 100 MG PO CAPS: 100.0000 mg | ORAL_CAPSULE | Freq: Two times a day (BID) | ORAL | 0 refills | Status: DC

## 2021-12-27 NOTE — Chronic Care Management (AMB) (Signed)
Chronic Care Management   CCM RN Visit Note  12/26/2021 Name: Jesus Lam MRN: [id number] DOB: 1933-11-08  Subjective: Jesus Lam is a 88 y.o. year old male who is a primary care patient of Minette Brine, [place]. The care management team was consulted for assistance with disease management and care coordination needs.    Engaged with patient by telephone for follow up visit in response to provider referral for case management and/or care coordination services.   Consent to Services:  The patient was given information about Chronic Care Management services, agreed to services, and gave verbal consent prior to initiation of services.  Please see initial visit note for detailed documentation.   Patient agreed to services and verbal consent obtained.   Assessment: Review of patient past medical history, allergies, medications, health status, including review of consultants reports, laboratory and other test data, was performed as part of comprehensive evaluation and provision of chronic care management services.   SDOH (Social Determinants of Health) assessments and interventions performed:    CCM Care Plan  Allergies  Allergen Reactions   Shellfish Allergy Other (See Comments)    Gout     Outpatient Encounter Medications as of 12/26/2021  Medication Sig   amLODipine (NORVASC) 5 MG tablet TAKE ONE TABLET BY MOUTH DAILY   atorvastatin (LIPITOR) 40 MG tablet TAKE TWO TABLETS BY MOUTH DAILY   azelastine (ASTELIN) 0.1 % nasal spray Place 2 sprays into both nostrils 2 (two) times daily. Use in each nostril as directed   bisoprolol (ZEBETA) 5 MG tablet TAKE HALF TABLET BY MOUTH DAILY   chlorthalidone (HYGROTON) 25 MG tablet Take 1 tablet (25 mg total) by mouth daily.   Cholecalciferol (VITAMIN D3) 5000 units CAPS Take 5,000 Units by mouth daily.   clopidogrel (PLAVIX) 75 MG tablet TAKE ONE TABLET BY MOUTH DAILY   donepezil (ARICEPT) 10 MG tablet TAKE 1 TABLET AT BEDTIME   doxycycline  (MONODOX) 100 MG capsule Take 1 capsule (100 mg total) by mouth 2 (two) times daily.   Elastic Bandages & Supports (MEDICAL COMPRESSION STOCKINGS) MISC    furosemide (LASIX) 20 MG tablet TAKE ONE TABLET BY MOUTH DAILY   furosemide (LASIX) 40 MG tablet Take 1 tablet (40 mg total) by mouth daily.   gabapentin (NEURONTIN) 300 MG capsule TAKE ONE CAPSULE BY MOUTH AT BEDTIME   hydrALAZINE (APRESOLINE) 25 MG tablet TAKE ONE TABLET BY MOUTH TWICE A DAY   icosapent Ethyl (VASCEPA) 1 g capsule TAKE ONE CAPSULE BY MOUTH TWICE A DAY   isosorbide mononitrate (IMDUR) 30 MG 24 hr tablet TAKE ONE TABLET BY MOUTH TWICE A DAY   ketorolac (ACULAR) 0.5 % ophthalmic solution ketorolac 0.5 % eye drops   levocetirizine (XYZAL) 5 MG tablet TAKE ONE TABLET BY MOUTH EVERY EVENING   Menthol-Methyl Salicylate (MUSCLE RUB) 10-15 % CREA muscle rub   methimazole (TAPAZOLE) 10 MG tablet TAKE ONE TABLET BY MOUTH DAILY   mirabegron ER (MYRBETRIQ) 25 MG TB24 tablet Take 1 tablet (25 mg total) by mouth daily.   Multiple Vitamins-Minerals (COMPLETE SENIOR PO) 1 tablet daily.    tamsulosin (FLOMAX) 0.4 MG CAPS capsule TAKE ONE CAPSULE BY MOUTH DAILY HALF HOUR FOLLOWING THE SAME MEAL DAILY   vitamin B-12 (CYANOCOBALAMIN) 500 MCG tablet Take 500 mcg by mouth daily.   vitamin C (ASCORBIC ACID) 500 MG tablet Take 500 mg daily by mouth.   Facility-Administered Encounter Medications as of 12/26/2021  Medication   0.9 %  sodium chloride infusion  Patient Active Problem List   Diagnosis Date Noted   Leg edema 08/01/2021   Mild cognitive impairment 09/07/2020   Chronic pain of left knee 03/15/2020   Pure hypercholesterolemia 11/15/2019   First degree AV block 07/28/2019   Hypertriglyceridemia 04/29/2019   Mixed hyperlipidemia 04/29/2019   CVA (cerebral vascular accident) ([place])    Bradycardia    Subclinical hyperthyroidism 11/18/2018   Peripheral artery disease ([place]) 06/13/2018   Ischemic cerebrovascular accident (CVA) ([place])  06/13/2018   Dysarthria 06/12/2018   Renal insufficiency 06/12/2018   Claudication ([place]) 01/11/2018   Anemia 10/10/2017   Essential hypertension 11/04/2013    Conditions to be addressed/monitored: HTN, Hyperthyroidism, Altered memory,  Bilateral hand pain, Medication management, Pure hypercholesterolemia, Chronic pain of left knee   Care Plan : RN Care Manager Plan of Care  Updates made by Lynne Logan, RN since 12/26/2021 12:00 AM     Problem: No Plan of Care established for management of chronic disease states (HTN, Hyperthyroidism, Altered memory,  Bilateral hand pain, Medication management, Pure hypercholesterolemia, Chronic pain of left knee)   Priority: High     Long-Range Goal: Establishment of plan of care for management of chronic disease states (HTN, Hyperthyroidism, Altered memory,  Bilateral hand pain, Medication management, Pure hypercholesterolemia, Chronic pain of left knee)   Start Date: 12/13/2021  Expected End Date: 12/13/2022  Recent Progress: On track  Priority: High  Note:   Current Barriers:  Knowledge Deficits related to plan of care for management of HTN, Hyperthyroidism, Altered memory,  Bilateral hand pain, Medication management, Pure hypercholesterolemia, Chronic pain of left knee   Chronic Disease Management support and education needs related to HTN, Hyperthyroidism, Altered memory,  Bilateral hand pain, Medication management, Pure hypercholesterolemia, Chronic pain of left knee    RNCM Clinical Goal(s):  Patient will verbalize basic understanding of  HTN, Hyperthyroidism, Altered memory,  Bilateral hand pain, Medication management, Pure hypercholesterolemia, Chronic pain of left knee  disease process and self health management plan as evidenced by patient will report having no disease exacerbations related to his chronic disease states listed above  take all medications exactly as prescribed and will call provider for medication related questions as  evidenced by patient will demonstrate improved understanding of prescribed medications and rationale for usage as evidenced by patient teach back demonstrate Improved health management independence as evidenced by patient will maintain current mobility and fuctionality continue to work with RN Care Manager to address care management and care coordination needs related to  HTN, Hyperthyroidism, Altered memory,  Bilateral hand pain, Medication management, Pure hypercholesterolemia, Chronic pain of left knee  as evidenced by adherence to CM Team Scheduled appointments through collaboration with RN Care manager, provider, and care team.   Interventions: 1:1 collaboration with primary care provider regarding development and update of comprehensive plan of care as evidenced by provider attestation and co-signature Inter-disciplinary care team collaboration (see longitudinal plan of care) Evaluation of current treatment plan related to  self management and patient's adherence to plan as established by provider   Chronic Kidney Disease Interventions:  (Status:  Condition stable.  Not addressed this visit.) Long Term Goal Assessed the Patient understanding of chronic kidney disease    Evaluation of current treatment plan related to chronic kidney disease self management and patient's adherence to plan as established by provider      Reviewed prescribed diet increase daily water intake to 64 oz  Discussed the impact of chronic kidney disease on daily life and mental health  and acknowledged and normalized feelings of disempowerment, fear, and frustration    Provided education on kidney disease progression    Determined patient is experiencing urinary frequency with nocturia, denies other symptoms but states this is a new problem that started about 1 month ago Educated patient on s/s suggestive UTI, educated on other potential causes and complications if left undiagnosed/treated  Determined patient feels he is  staying well hydrated as evidence by patient is drinking 4-16.9 oz of bottled water daily  Encouraged patient to schedule a PCP visit for further evaluation/treatment, placed patient on PCP schedule for 12/17/21 _0 :00 PM, daughter will provide transportation  Discussed plans with patient for ongoing care management follow up and provided patient with direct contact information for care management team Last practice recorded BP readings:  BP Readings from Last 3 Encounters:  11/08/21 123/66  08/15/21 (!) 166/74  08/01/21 (!) 159/70  Most recent eGFR/CrCl:  Lab Results  Component Value Date   EGFR 59 (L) 08/23/2021    No components found for: CRCL Placed outbound successful call to patient to f/u on PCP evaluation scheduled for 12/17/21 for urinary frequency as discussed at last RN CM follow up Determined patient missed the appointment but continues to experience urinary frequency and would like to reschedule his appointment for the next available Placed patient on PCP schedule with Minette Brine, FNP for next available, Monday 12/24/21 _1 :00 PM Discussed with patient the importance of patient keeping all MD scheduled appointments, patient verbalizes understanding and states he will keep this appointment Discussed plans with patient for ongoing care management follow up and provided patient with direct contact information for care management team  Rheumatoid Arthritis Interventions:  (Status:  Condition stable.  Not addressed this visit.)  Long Term Goal Evaluation of current treatment plan related to  Rheumatoid Arthritis , self-management and patient's adherence to plan as established by provider Provided education to patient about basic disease process related to RA Review of patient status, including review of consultant's reports, relevant laboratory and other test results, and medications completed. Reviewed medications with patient and discussed importance of medication adherence Discussed  plans with patient for ongoing care management follow up and provided patient with direct contact information for care management team  Hyperlipidemia Interventions:  (Status:  Condition stable.  Not addressed this visit.) Long Term Goal Evaluation of current treatment plan related to Hyperlipidemia, self-management and patient's adherence to plan as established by provider Provided education to patient about basic disease process related to Hyperlipidemia Review of patient status, including review of consultant's reports, relevant laboratory and other test results, and medications completed. Reviewed medications with patient and discussed importance of medication adherence Discussed plans with patient for ongoing care management follow up and provided patient with direct contact information for care management team Lipid Panel     Component Value Date/Time   CHOL 124 02/08/2021 1529   TRIG 203 (H) 02/08/2021 1529   HDL 32 (L) 02/08/2021 1529   CHOLHDL 3.9 02/08/2021 1529   CHOLHDL 4.4 06/13/2018 1018   VLDL 18 06/13/2018 1018   LDLCALC 59 02/08/2021 1529   LABVLDL 33 02/08/2021 1529   Dementia Interventions:  (Status:  Condition stable.  Not addressed this visit.)  Long Term Goal Evaluation of current treatment plan related to memory loss, self-management and patient's adherence to plan as established by provider Provided education to patient about basic disease process related to Dementia  Review of patient status, including review of consultant's reports, relevant laboratory and other test  results, and medications completed. Reviewed medications with patient and discussed importance of medication adherence Discussed plans with patient for ongoing care management follow up and provided patient with direct contact information for care management team   Elevated PSA:  (Status:  New goal.)  Long Term Goal Completed successful outbound call with patient and daughter [name] Evaluation of  current treatment plan related to  Elevated PSA , self-management and patient's adherence to plan as established by provider. Review of patient status, including review of consultant's reports, relevant laboratory and other test results, and medications completed. Educated on basic disease process related to elevated PSA Reviewed medications with patient and discussed importance of medication adherence, including the importance of completing full course of Doxycycline for elevated PSA Reviewed and discussed Urology referral, educated on referral process and next steps Discussed plans with patient for ongoing care management follow up and provided patient with direct contact information for care management team   Subclinical hyperthyroidism:  (Status:  New goal.)  Short Term Goal Completed successful outbound call with patient and daughter [name]  Evaluation of current treatment plan related to  Subclinical hyperthyroidism , self-management and patient's adherence to plan as established by provider. Review of patient status, including review of consultant's reports, relevant laboratory and other test results, and medications completed. Reviewed medications with patient and discussed importance of medication adherence, determined patient is not currently taking a prescription medication prescribed for thyroid disease Determined patient previously established with Dr. Kelton Pillar with last visit completed undetermined Collaboration with PCP Minette Brine FNP regarding new Endocrinology referral needed to be sent to Dr. Kelton Pillar as patient was previously established, this has been completed, educated daughter on referral process  Reviewed scheduled/upcoming provider appointments including: next PCP follow up appointment scheduled for 02/14/22 _0 :20 PM  Discussed plans with patient for ongoing care management follow up and provided patient with direct contact information for care management team   Patient  Goals/Self-Care Activities: Take all medications as prescribed Attend all scheduled provider appointments Call pharmacy for medication refills 3-7 days in advance of running out of medications Perform all self care activities independently  Call provider office for new concerns or questions   Follow Up Plan:  Telephone follow up appointment with care management team member scheduled for:  02/19/22      Plan:Telephone follow up appointment with care management team member scheduled for:  02/19/22  Barb Merino, RN, BSN, CCM Care Management Coordinator [place] [facility]  Direct Phone: [phone]

## 2021-12-27 NOTE — Patient Instructions (Signed)
Visit Information  Thank you for taking time to visit with me today. Please don't hesitate to contact me if I can be of assistance to you before our next scheduled telephone appointment.  Following are the goals we discussed today:  (Copy and paste patient goals from clinical care plan here)  Our next appointment is by telephone on 02/19/22 at 12 noon  Please call the care guide team at [phone] if you need to cancel or reschedule your appointment.   If you are experiencing a Mental Health or Behavioral Health Crisis or need someone to talk to, please call 1-800-273-TALK (toll free, 24 hour hotline)   The patient verbalized understanding of instructions, educational materials, and care plan provided today and agreed to receive a mailed copy of patient instructions, educational materials, and care plan.   Delsa SaleAngel Nykolas Bacallao, RN, BSN, CCM Care Management Coordinator [facility]  Direct Phone: [phone]

## 2021-12-30 DIAGNOSIS — I10 Essential (primary) hypertension: Secondary | ICD-10-CM | POA: Diagnosis not present

## 2021-12-31 DIAGNOSIS — R6889 Other general symptoms and signs: Secondary | ICD-10-CM | POA: Diagnosis not present

## 2022-01-02 DIAGNOSIS — R6889 Other general symptoms and signs: Secondary | ICD-10-CM | POA: Diagnosis not present

## 2022-01-04 ENCOUNTER — Other Ambulatory Visit: Payer: Self-pay | Admitting: Cardiology

## 2022-01-04 DIAGNOSIS — R6889 Other general symptoms and signs: Secondary | ICD-10-CM | POA: Diagnosis not present

## 2022-01-07 ENCOUNTER — Encounter: Payer: Self-pay | Admitting: Nurse Practitioner

## 2022-01-09 DIAGNOSIS — R6889 Other general symptoms and signs: Secondary | ICD-10-CM | POA: Diagnosis not present

## 2022-01-10 ENCOUNTER — Encounter: Payer: Self-pay | Admitting: Cardiology

## 2022-01-10 ENCOUNTER — Other Ambulatory Visit: Payer: Self-pay

## 2022-01-10 ENCOUNTER — Ambulatory Visit: Payer: Medicare HMO | Admitting: Cardiology

## 2022-01-10 VITALS — BP 145/67 | HR 56 | Temp 97.6°F | Resp 16 | Ht 72.0 in | Wt 238.0 lb

## 2022-01-10 DIAGNOSIS — I10 Essential (primary) hypertension: Secondary | ICD-10-CM

## 2022-01-10 DIAGNOSIS — I739 Peripheral vascular disease, unspecified: Secondary | ICD-10-CM | POA: Diagnosis not present

## 2022-01-10 MED ORDER — HYDRALAZINE HCL 50 MG PO TABS: 50.0000 mg | ORAL_TABLET | Freq: Three times a day (TID) | ORAL | 3 refills | Status: DC

## 2022-01-11 DIAGNOSIS — R6889 Other general symptoms and signs: Secondary | ICD-10-CM | POA: Diagnosis not present

## 2022-01-12 ENCOUNTER — Encounter: Payer: Self-pay | Admitting: Cardiology

## 2022-01-12 NOTE — Progress Notes (Signed)
Subjective:   Jesus Lam, male    DOB: 02-Mar-1933, 88 y.o.   MRN: [id number]   Chief complaint:  PAD   88 year old African-American male with severe bilateral PAD with class III claudication, now s/p successful DCB Rt SFA and 2 overlapping Zilver PTX stents placed left SFA (12/2017), s/p left temporal occipital cortical infarct (05/2018).  He continues to be able to swim laps without much difficulty, but has pain in both his knees on walking. He has not seen his orthopedic surgeon yet. Recently, his TSH was found to be low. He has not seen an endocrinologist yet. Blood pressure is elevated today.   Current Outpatient Medications on File Prior to Visit  Medication Sig Dispense Refill   amLODipine (NORVASC) 5 MG tablet TAKE ONE TABLET BY MOUTH DAILY 90 tablet 7   atorvastatin (LIPITOR) 40 MG tablet TAKE TWO TABLETS BY MOUTH DAILY 90 tablet 4   bisoprolol (ZEBETA) 5 MG tablet TAKE HALF TABLET BY MOUTH DAILY 60 tablet 0   chlorthalidone (HYGROTON) 25 MG tablet Take 1 tablet (25 mg total) by mouth daily. 5 tablet 0   Cholecalciferol (VITAMIN D3) 5000 units CAPS Take 5,000 Units by mouth daily.     clopidogrel (PLAVIX) 75 MG tablet TAKE ONE TABLET BY MOUTH DAILY 90 tablet 3   donepezil (ARICEPT) 10 MG tablet TAKE 1 TABLET AT BEDTIME 90 tablet 1   doxycycline (MONODOX) 100 MG capsule Take 1 capsule (100 mg total) by mouth 2 (two) times daily. 14 capsule 0   Elastic Bandages & Supports (MEDICAL COMPRESSION STOCKINGS) MISC      furosemide (LASIX) 20 MG tablet TAKE ONE TABLET BY MOUTH DAILY 90 tablet 4   gabapentin (NEURONTIN) 300 MG capsule TAKE ONE CAPSULE BY MOUTH AT BEDTIME 90 capsule 0   icosapent Ethyl (VASCEPA) 1 g capsule TAKE ONE CAPSULE BY MOUTH TWICE A DAY 60 capsule 0   isosorbide mononitrate (IMDUR) 30 MG 24 hr tablet TAKE ONE TABLET BY MOUTH TWICE A DAY 60 tablet 0   ketorolac (ACULAR) 0.5 % ophthalmic solution ketorolac 0.5 % eye drops     levocetirizine (XYZAL) 5 MG tablet  TAKE ONE TABLET BY MOUTH EVERY EVENING 90 tablet 4   Menthol-Methyl Salicylate (MUSCLE RUB) 10-15 % CREA muscle rub     methimazole (TAPAZOLE) 10 MG tablet TAKE ONE TABLET BY MOUTH DAILY 30 tablet 0   mirabegron ER (MYRBETRIQ) 25 MG TB24 tablet Take 1 tablet (25 mg total) by mouth daily. 30 tablet 2   Multiple Vitamins-Minerals (COMPLETE SENIOR PO) 1 tablet daily.      NON FORMULARY e     tamsulosin (FLOMAX) 0.4 MG CAPS capsule TAKE ONE CAPSULE BY MOUTH DAILY HALF HOUR FOLLOWING THE SAME MEAL DAILY 90 capsule 4   vitamin B-12 (CYANOCOBALAMIN) 500 MCG tablet Take 500 mcg by mouth daily.     vitamin C (ASCORBIC ACID) 500 MG tablet Take 500 mg daily by mouth.     azelastine (ASTELIN) 0.1 % nasal spray Place 2 sprays into both nostrils 2 (two) times daily. Use in each nostril as directed 30 mL 5   furosemide (LASIX) 40 MG tablet Take 1 tablet (40 mg total) by mouth daily. (Patient not taking: Reported on 01/10/2022) 60 tablet 3   Current Facility-Administered Medications on File Prior to Visit  Medication Dose Route Frequency Provider Last Rate Last Admin   0.9 %  sodium chloride infusion   Intravenous PRN Bary Castilla, NP  Cardiovascular studies:  EKG 07/05/2021: Sinus rhythm 62 bpm First degree A-V block  Occasional PAC    Left ventricular hypertrophy Old anteroseptal infarct Right bundle branch block Left anterior fascicular block   ABI 02/05/2018: This exam reveals moderately decreased perfusion of the lower extremity, noted at  the post tibial artery level (ABI). Right ABI 0.79 and left ABI 0.71. Compared to  11/19/2017:   LABI 0.67 and RABI 0.59.  Periperal angiography 01/27/2018:  Right mid and distal SFA CTO, moderate calcification evident. Successful PTA and stenting of the CTO right mid and distal SFA with antegrade left femoral and retrograde right PT access and implantation of 2 overlapping 6.0 x 1 20 mm Zilver PTX drug-coated stents. Stenosis reduced from 100% to  0%. Brisk flow was evident at the end of the procedure..  Echocardiogram 07/24/2018: 1. Left ventricle cavity is normal in size. Mild concentric hypertrophy of the left ventricle. Doppler evidence of grade I (impaired) diastolic dysfunction, normal LAP. Left ventricle regional wall motion findings: Basal and mid inferior hypokinesis. Calculated EF 46%. 2. Moderate aortic valve leaflet thickening. Mildly restricted aortic valve leaflets. No evidence of aortic valve stenosis. Mild (Grade I) aortic regurgitation. 3. Structurally normal mitral valve with mild (Grade I) regurgitation. 4. Compared to the study done on 09/20/2017, no significant change in LVEF. Wall motion appears to be new. Previously mitral regurgitation was mild to moderate.   Recent labs: 12/24/2021: Glucose 81, BUN/Cr 21/1.28. EGFR 54. Na/K 145/4.8.  HbA1C 5.4% Chol 111, TG 135, HDL 36, LDL 51 TSH 0.05 very low Free T3, T4 normal   02/08/2021: Glucose 90, BUN/Cr 15/1.39. EGFR 49. Na/K 136/5.1. Rest of the CMP normal Chol 124, TG 203, HDL 32, LDL 59 TSH 1.1 normal  04/27/2020: Glucose 86, BUN/Cr 12/1.16. EGFR 66. Na/K 141/4.8. Rest of the CMP normal Chol 120, TG 165, HDL 34, LDL 58 TSH 0.9 normal  02/03/2020: Glucose 81, BUN/Cr 16/1.1. EGFR 70. Na/K 142/4.1. Rest of the CMP normal HbA1C 5.1% Chol 140, TG 253, HDL 31, LDL 68 TSH 8.5, elevated  12/29/2019: H/H 11.7/36.4. MCV 92. Platelets 194 Chol 108, TG 143, HDL 32, LDL 51   Review of Systems  Cardiovascular:  Positive for claudication (Left leg) and leg swelling. Negative for chest pain, dyspnea on exertion, palpitations and syncope.  Musculoskeletal:  Positive for joint pain.      Vitals:   01/10/22 0912  BP: (!) 145/67  Pulse: (!) 56  Resp: 16  Temp: 97.6 F (36.4 C)  SpO2: 97%     Body mass index is 32.28 kg/m. Filed Weights   01/10/22 0912  Weight: 238 lb (108 kg)      Objective:    Physical Exam Vitals and nursing note reviewed.   Constitutional:      Appearance: He is well-developed.  Neck:     Vascular: No JVD.  Cardiovascular:     Rate and Rhythm: Normal rate and regular rhythm.     Pulses:          Femoral pulses are 3+ on the right side and 3+ on the left side.      Popliteal pulses are 1+ on the right side and 1+ on the left side.       Dorsalis pedis pulses are 0 on the right side and 0 on the left side.       Posterior tibial pulses are 0 on the right side and 0 on the left side.     Heart sounds:  Normal heart sounds. No murmur heard. Pulmonary:     Effort: Pulmonary effort is normal.     Breath sounds: Normal breath sounds. No wheezing or rales.  Musculoskeletal:     Right lower leg: Edema (1+) present.     Left lower leg: Edema (1+) present.          Assessment & Recommendations:   88 year old African-American male with PAD, h/o left temporal occipital cortical infarct (05/2018).  PAD: DCB Rt SFA and 2 overlapping Zilver PTX stents placed left SFA (12/2017). B/l moderately reduced ABI, details above. However, his bl knee pain more likely due to osteoarthritis. If pain does not improve after OA management, will consider peripheral angiography and possible intervention. Continue medical management. Not on Pletal due to prior h/o heart failure  Hypertension: Uncontrolled. I reckon hyperthyroidism is playing a role. Needs to see endocrinology.  In the meantime,  I have increased his hydralazine to 50 mg tid.  Hypertriglyceridemia: Improved on lipitor and vascepa.  F/u in 3 months  [place], MD [facility] Pager: [phone] Office: [phone] If no answer Cell [phone]

## 2022-01-21 DIAGNOSIS — R6889 Other general symptoms and signs: Secondary | ICD-10-CM | POA: Diagnosis not present

## 2022-01-22 DIAGNOSIS — R6889 Other general symptoms and signs: Secondary | ICD-10-CM | POA: Diagnosis not present

## 2022-01-23 DIAGNOSIS — R6889 Other general symptoms and signs: Secondary | ICD-10-CM | POA: Diagnosis not present

## 2022-01-25 DIAGNOSIS — R6889 Other general symptoms and signs: Secondary | ICD-10-CM | POA: Diagnosis not present

## 2022-01-28 DIAGNOSIS — R6889 Other general symptoms and signs: Secondary | ICD-10-CM | POA: Diagnosis not present

## 2022-01-29 DIAGNOSIS — R6889 Other general symptoms and signs: Secondary | ICD-10-CM | POA: Diagnosis not present

## 2022-01-29 DIAGNOSIS — I10 Essential (primary) hypertension: Secondary | ICD-10-CM | POA: Diagnosis not present

## 2022-02-01 DIAGNOSIS — R6889 Other general symptoms and signs: Secondary | ICD-10-CM | POA: Diagnosis not present

## 2022-02-05 DIAGNOSIS — R6889 Other general symptoms and signs: Secondary | ICD-10-CM | POA: Diagnosis not present

## 2022-02-08 DIAGNOSIS — R6889 Other general symptoms and signs: Secondary | ICD-10-CM | POA: Diagnosis not present

## 2022-02-11 ENCOUNTER — Telehealth: Payer: Self-pay

## 2022-02-11 DIAGNOSIS — R6889 Other general symptoms and signs: Secondary | ICD-10-CM | POA: Diagnosis not present

## 2022-02-11 NOTE — Chronic Care Management (AMB) (Signed)
? ?  Jesus Lam was reminded to have all medications, supplements and any blood glucose and blood pressure readings available for review with [name], Pharm. D, at his telephone visit on 02-15-2022 at 11:30. ? ? ?Malecca Hicks CMA ?Clinical Pharmacist Assistant ?[phone] ? ? ?

## 2022-02-13 DIAGNOSIS — R6889 Other general symptoms and signs: Secondary | ICD-10-CM | POA: Diagnosis not present

## 2022-02-14 ENCOUNTER — Ambulatory Visit (INDEPENDENT_AMBULATORY_CARE_PROVIDER_SITE_OTHER): Payer: Medicare HMO

## 2022-02-14 ENCOUNTER — Ambulatory Visit (INDEPENDENT_AMBULATORY_CARE_PROVIDER_SITE_OTHER): Payer: Medicare HMO | Admitting: Nurse Practitioner

## 2022-02-14 ENCOUNTER — Encounter: Payer: Self-pay | Admitting: Nurse Practitioner

## 2022-02-14 ENCOUNTER — Other Ambulatory Visit: Payer: Self-pay

## 2022-02-14 VITALS — BP 126/60 | HR 62 | Temp 98.7°F | Ht 72.0 in | Wt 234.4 lb

## 2022-02-14 VITALS — BP 126/60 | HR 62 | Temp 98.7°F | Ht 72.0 in | Wt 234.0 lb

## 2022-02-14 DIAGNOSIS — M25562 Pain in left knee: Secondary | ICD-10-CM | POA: Diagnosis not present

## 2022-02-14 DIAGNOSIS — E7800 Pure hypercholesterolemia, unspecified: Secondary | ICD-10-CM | POA: Diagnosis not present

## 2022-02-14 DIAGNOSIS — Z23 Encounter for immunization: Secondary | ICD-10-CM

## 2022-02-14 DIAGNOSIS — Z0000 Encounter for general adult medical examination without abnormal findings: Secondary | ICD-10-CM | POA: Diagnosis not present

## 2022-02-14 DIAGNOSIS — E6609 Other obesity due to excess calories: Secondary | ICD-10-CM

## 2022-02-14 DIAGNOSIS — E0590 Thyrotoxicosis, unspecified without thyrotoxic crisis or storm: Secondary | ICD-10-CM

## 2022-02-14 DIAGNOSIS — I10 Essential (primary) hypertension: Secondary | ICD-10-CM | POA: Diagnosis not present

## 2022-02-14 DIAGNOSIS — G8929 Other chronic pain: Secondary | ICD-10-CM | POA: Diagnosis not present

## 2022-02-14 DIAGNOSIS — Z6831 Body mass index (BMI) 31.0-31.9, adult: Secondary | ICD-10-CM

## 2022-02-14 DIAGNOSIS — R972 Elevated prostate specific antigen [PSA]: Secondary | ICD-10-CM

## 2022-02-14 MED ORDER — DICLOFENAC SODIUM 1 % EX GEL: 2.0000 g | Freq: Four times a day (QID) | CUTANEOUS | 2 refills | Status: DC

## 2022-02-14 NOTE — Patient Instructions (Addendum)
Hypertension, Adult ?High blood pressure (hypertension) is when the force of blood pumping through the arteries is too strong. The arteries are the blood vessels that carry blood from the heart throughout the body. Hypertension forces the heart to work harder to pump blood and may cause arteries to become narrow or stiff. Untreated or uncontrolled hypertension can cause a heart attack, heart failure, a stroke, kidney disease, and other problems. ?A blood pressure reading consists of a higher number over a lower number. Ideally, your blood pressure should be below 120/80. The first ("top") number is called the systolic pressure. It is a measure of the pressure in your arteries as your heart beats. The second ("bottom") number is called the diastolic pressure. It is a measure of the pressure in your arteries as the heart relaxes. ?What are the causes? ?The exact cause of this condition is not known. There are some conditions that result in or are related to high blood pressure. ?What increases the risk? ?Some risk factors for high blood pressure are under your control. The following factors may make you more likely to develop this condition: ?Smoking. ?Having type 2 diabetes mellitus, high cholesterol, or both. ?Not getting enough exercise or physical activity. ?Being overweight. ?Having too much fat, sugar, calories, or salt (sodium) in your diet. ?Drinking too much alcohol. ?Some risk factors for high blood pressure may be difficult or impossible to change. Some of these factors include: ?Having chronic kidney disease. ?Having a family history of high blood pressure. ?Age. Risk increases with age. ?Race. You may be at higher risk if you are African American. ?Gender. Men are at higher risk than women before age 57. After age 32, women are at higher risk than men. ?Having obstructive sleep apnea. ?Stress. ?What are the signs or symptoms? ?High blood pressure may not cause symptoms. Very high blood pressure  (hypertensive crisis) may cause: ?Headache. ?Anxiety. ?Shortness of breath. ?Nosebleed. ?Nausea and vomiting. ?Vision changes. ?Severe chest pain. ?Seizures. ?How is this diagnosed? ?This condition is diagnosed by measuring your blood pressure while you are seated, with your arm resting on a flat surface, your legs uncrossed, and your feet flat on the floor. The cuff of the blood pressure monitor will be placed directly against the skin of your upper arm at the level of your heart. It should be measured at least twice using the same arm. Certain conditions can cause a difference in blood pressure between your right and left arms. ?Certain factors can cause blood pressure readings to be lower or higher than normal for a short period of time: ?When your blood pressure is higher when you are in a health care provider's office than when you are at home, this is called white coat hypertension. Most people with this condition do not need medicines. ?When your blood pressure is higher at home than when you are in a health care provider's office, this is called masked hypertension. Most people with this condition may need medicines to control blood pressure. ?If you have a high blood pressure reading during one visit or you have normal blood pressure with other risk factors, you may be asked to: ?Return on a different day to have your blood pressure checked again. ?Monitor your blood pressure at home for 1 week or longer. ?If you are diagnosed with hypertension, you may have other blood or imaging tests to help your health care provider understand your overall risk for other conditions. ?How is this treated? ?This condition is treated by making  healthy lifestyle changes, such as eating healthy foods, exercising more, and reducing your alcohol intake. Your health care provider may prescribe medicine if lifestyle changes are not enough to get your blood pressure under control, and if: ?Your systolic blood pressure is above  130. ?Your diastolic blood pressure is above 80. ?Your personal target blood pressure may vary depending on your medical conditions, your age, and other factors. ?Follow these instructions at home: ?Eating and drinking ? ?Eat a diet that is high in fiber and potassium, and low in sodium, added sugar, and fat. An example eating plan is called the DASH (Dietary Approaches to Stop Hypertension) diet. To eat this way: ?Eat plenty of fresh fruits and vegetables. Try to fill one half of your plate at each meal with fruits and vegetables. ?Eat whole grains, such as whole-wheat pasta, brown rice, or whole-grain bread. Fill about one fourth of your plate with whole grains. ?Eat or drink low-fat dairy products, such as skim milk or low-fat yogurt. ?Avoid fatty cuts of meat, processed or cured meats, and poultry with skin. Fill about one fourth of your plate with lean proteins, such as fish, chicken without skin, beans, eggs, or tofu. ?Avoid pre-made and processed foods. These tend to be higher in sodium, added sugar, and fat. ?Reduce your daily sodium intake. Most people with hypertension should eat less than 1,500 mg of sodium a day. ?Do not drink alcohol if: ?Your health care provider tells you not to drink. ?You are pregnant, may be pregnant, or are planning to become pregnant. ?If you drink alcohol: ?Limit how much you use to: ?0-1 drink a day for women. ?0-2 drinks a day for men. ?Be aware of how much alcohol is in your drink. In the [place] one drink equals one 12 oz bottle of beer (355 mL), one 5 oz glass of wine (148 mL), or one 1? oz glass of hard liquor (44 mL). ?Lifestyle ? ?Work with your health care provider to maintain a healthy body weight or to lose weight. Ask what an ideal weight is for you. ?Get at least 30 minutes of exercise most days of the week. Activities may include walking, swimming, or biking. ?Include exercise to strengthen your muscles (resistance exercise), such as Pilates or lifting weights, as  part of your weekly exercise routine. Try to do these types of exercises for 30 minutes at least 3 days a week. ?Do not use any products that contain nicotine or tobacco, such as cigarettes, e-cigarettes, and chewing tobacco. If you need help quitting, ask your health care provider. ?Monitor your blood pressure at home as told by your health care provider. ?Keep all follow-up visits as told by your health care provider. This is important. ?Medicines ?Take over-the-counter and prescription medicines only as told by your health care provider. Follow directions carefully. Blood pressure medicines must be taken as prescribed. ?Do not skip doses of blood pressure medicine. Doing this puts you at risk for problems and can make the medicine less effective. ?Ask your health care provider about side effects or reactions to medicines that you should watch for. ?Contact a health care provider if you: ?Think you are having a reaction to a medicine you are taking. ?Have headaches that keep coming back (recurring). ?Feel dizzy. ?Have swelling in your ankles. ?Have trouble with your vision. ?Get help right away if you: ?Develop a severe headache or confusion. ?Have unusual weakness or numbness. ?Feel faint. ?Have severe pain in your chest or abdomen. ?Vomit repeatedly. ?Have trouble  breathing. ?Summary ?Hypertension is when the force of blood pumping through your arteries is too strong. If this condition is not controlled, it may put you at risk for serious complications. ?Your personal target blood pressure may vary depending on your medical conditions, your age, and other factors. For most people, a normal blood pressure is less than 120/80. ?Hypertension is treated with lifestyle changes, medicines, or a combination of both. Lifestyle changes include losing weight, eating a healthy, low-sodium diet, exercising more, and limiting alcohol. ?This information is not intended to replace advice given to you by your health care  provider. Make sure you discuss any questions you have with your health care provider. ?Document Revised: 07/22/2018 Document Reviewed: 07/22/2018 ?Elsevier Patient Education ? [place]. ? ?Dr Charlett Lango ?[place]

## 2022-02-14 NOTE — Progress Notes (Signed)
?Industrial/product designer as a Education administrator for [employer], FNP.,have documented all relevant documentation on the behalf of Minette Brine, FNP,as directed by  Minette Brine, FNP while in the presence of Minette Brine, [place]. ? ?This visit occurred during the SARS-CoV-2 public health emergency.  Safety protocols were in place, including screening questions prior to the visit, additional usage of staff PPE, and extensive cleaning of exam room while observing appropriate contact time as indicated for disinfecting solutions. ? ?Subjective:  ?  ? Patient ID: Jesus Lam , male    DOB: [birth year] , 88 y.o.   MRN: [id number] ? ? ?Chief Complaint  ?Patient presents with  ? Hypertension  ? ? ?HPI ? ?Pt is here today for a hypertension follow up.  He had his AWV done ?His daughter is here today Zarif Les to review his medications.  ? ?Furosemid 20 mg daily  ?Amlodipine 5 mg daily ?Tamsulosin 0.4 mg daily half hour following the same meal daily  ?Clopidregel 75mg  daily ?Hydralazine 25 mg twice a day ?Isorsobide mononitrate ER 30 mg twice a day ?Bisoprolol fumarate 5 mg (1/2 tab) daily ?Donepezil 10 mg daily ? ?He feels his memory is better since taking donepezil. His second page of medications were left off from her picture.  ? ?His daughter inquires why he can not have a knee replacement. He is interested in having a knee replacement. When he has an injection he will do good for 2-3 months. ? ?Hypertension ?This is a chronic problem. The current episode started more than 1 year ago. The problem is unchanged. The problem is controlled. Pertinent negatives include no anxiety, chest pain, headaches or palpitations. There are no associated agents to hypertension. Risk factors for coronary artery disease include obesity, male gender and sedentary lifestyle. Past treatments include calcium channel blockers. The current treatment provides moderate improvement. There are no compliance problems.  There is no history of angina.  Identifiable causes of hypertension include a thyroid problem. There is no history of chronic renal disease.   ? ?Past Medical History:  ?Diagnosis Date  ? Anemia   ? BPH (benign prostatic hyperplasia)   ? Cataracts, bilateral   ? CVA (cerebral vascular accident) ([place])   ? DVT (deep venous thrombosis) ([place])   ? dvt in left leg  ? Dyslipidemia   ? Gout   ? Gout   ? HTN (hypertension)   ? Left hip pain 03/15/2020  ? Left leg pain   ? Osteoarthritis   ? PAD (peripheral artery disease) ([place])   ? Stasis dermatitis   ? Stroke [facility])   ? Vitamin D deficiency   ?  ? ?Family History  ?Problem Relation Age of Onset  ? Cancer Mother   ? Cancer Father   ? Alcohol abuse Father   ? Breast cancer Daughter   ? Stroke Daughter   ? CVA Daughter   ? ? ? ?Current Outpatient Medications:  ?  diclofenac Sodium (VOLTAREN) 1 % GEL, Apply 2 g topically 4 (four) times daily., Disp: 100 g, Rfl: 2 ?  amLODipine (NORVASC) 5 MG tablet, TAKE ONE TABLET BY MOUTH DAILY, Disp: 90 tablet, Rfl: 7 ?  atorvastatin (LIPITOR) 40 MG tablet, TAKE TWO TABLETS BY MOUTH DAILY, Disp: 90 tablet, Rfl: 4 ?  azelastine (ASTELIN) 0.1 % nasal spray, Place 2 sprays into both nostrils 2 (two) times daily. Use in each nostril as directed, Disp: 30 mL, Rfl: 5 ?  bisoprolol (ZEBETA) 5 MG tablet, TAKE HALF TABLET BY MOUTH DAILY, Disp:  60 tablet, Rfl: 0 ?  chlorthalidone (HYGROTON) 25 MG tablet, Take 1 tablet (25 mg total) by mouth daily., Disp: 5 tablet, Rfl: 0 ?  Cholecalciferol (VITAMIN D3) 5000 units CAPS, Take 5,000 Units by mouth daily., Disp: , Rfl:  ?  clopidogrel (PLAVIX) 75 MG tablet, TAKE ONE TABLET BY MOUTH DAILY, Disp: 90 tablet, Rfl: 3 ?  donepezil (ARICEPT) 10 MG tablet, TAKE 1 TABLET AT BEDTIME, Disp: 90 tablet, Rfl: 1 ?  Elastic Bandages & Supports (MEDICAL COMPRESSION STOCKINGS) MISC, , Disp: , Rfl:  ?  furosemide (LASIX) 20 MG tablet, TAKE ONE TABLET BY MOUTH DAILY, Disp: 90 tablet, Rfl: 4 ?  gabapentin (NEURONTIN) 300 MG capsule, TAKE ONE CAPSULE BY MOUTH  AT BEDTIME, Disp: 90 capsule, Rfl: 0 ?  hydrALAZINE (APRESOLINE) 50 MG tablet, Take 1 tablet (50 mg total) by mouth 3 (three) times daily., Disp: 270 tablet, Rfl: 3 ?  icosapent Ethyl (VASCEPA) 1 g capsule, TAKE ONE CAPSULE BY MOUTH TWICE A DAY, Disp: 60 capsule, Rfl: 0 ?  isosorbide mononitrate (IMDUR) 30 MG 24 hr tablet, TAKE ONE TABLET BY MOUTH TWICE A DAY, Disp: 60 tablet, Rfl: 0 ?  ketorolac (ACULAR) 0.5 % ophthalmic solution, ketorolac 0.5 % eye drops, Disp: , Rfl:  ?  levocetirizine (XYZAL) 5 MG tablet, TAKE ONE TABLET BY MOUTH EVERY EVENING, Disp: 90 tablet, Rfl: 4 ?  Menthol-Methyl Salicylate (MUSCLE RUB) 10-15 % CREA, muscle rub, Disp: , Rfl:  ?  methimazole (TAPAZOLE) 10 MG tablet, TAKE ONE TABLET BY MOUTH DAILY, Disp: 30 tablet, Rfl: 0 ?  mirabegron ER (MYRBETRIQ) 25 MG TB24 tablet, Take 1 tablet (25 mg total) by mouth daily., Disp: 30 tablet, Rfl: 2 ?  Multiple Vitamins-Minerals (COMPLETE SENIOR PO), 1 tablet daily. , Disp: , Rfl:  ?  tamsulosin (FLOMAX) 0.4 MG CAPS capsule, TAKE ONE CAPSULE BY MOUTH DAILY HALF HOUR FOLLOWING THE SAME MEAL DAILY, Disp: 90 capsule, Rfl: 4 ?  vitamin B-12 (CYANOCOBALAMIN) 500 MCG tablet, Take 500 mcg by mouth daily., Disp: , Rfl:  ?  vitamin C (ASCORBIC ACID) 500 MG tablet, Take 500 mg daily by mouth., Disp: , Rfl:  ? ?Current Facility-Administered Medications:  ?  0.9 %  sodium chloride infusion, , Intravenous, PRN, Dan Europe, [place], NP  ? ?Allergies  ?Allergen Reactions  ? Shellfish Allergy Other (See Comments)  ?  Gout   ?  ? ?Review of Systems  ?Constitutional: Negative.   ?Respiratory: Negative.    ?Cardiovascular: Negative.  Negative for chest pain and palpitations.  ?Gastrointestinal: Negative.   ?Neurological: Negative.  Negative for headaches.  ?Psychiatric/Behavioral: Negative.     ? ?Today's Vitals  ? 02/14/22 1446  ?BP: 126/60  ?Pulse: 62  ?Temp: 98.7 ?F (37.1 ?C)  ?TempSrc: Oral  ?Weight: 234 lb (106.1 kg)  ?Height: 6' (1.829 m)  ? ?Body mass index is 31.74  kg/m?.  ?Wt Readings from Last 3 Encounters:  ?02/14/22 234 lb (106.1 kg)  ?02/14/22 234 lb 6.4 oz (106.3 kg)  ?01/10/22 238 lb (108 kg)  ? ? ?Objective:  ?Physical Exam ?Vitals reviewed.  ?Constitutional:   ?   General: He is not in acute distress. ?   Appearance: Normal appearance. He is obese.  ?Cardiovascular:  ?   Rate and Rhythm: Normal rate and regular rhythm.  ?   Pulses: Normal pulses.  ?   Heart sounds: Normal heart sounds. No murmur heard. ?Pulmonary:  ?   Effort: No respiratory distress.  ?   Breath sounds: Normal  breath sounds.  ?Musculoskeletal:     ?   General: No swelling.  ?Skin: ?   General: Skin is warm and dry.  ?   Capillary Refill: Capillary refill takes less than 2 seconds.  ?Neurological:  ?   General: No focal deficit present.  ?   Mental Status: He is alert and oriented to person, place, and time.  ?Psychiatric:     ?   Mood and Affect: Mood normal.     ?   Behavior: Behavior normal.     ?   Thought Content: Thought content normal.     ?   Judgment: Judgment normal.  ?  ? ?   ?Assessment And Plan:  ?   ?1. Essential hypertension ?Comments: Blood pressure is well controlled, continue curretne medications ? ?2. Subclinical hyperthyroidism ?Comments: Continue follow up with Endocrinology. ? ?3. Pure hypercholesterolemia ?Comments: Stable, continue current medications. He has a follow up appt with [facility]  ? ?4. Class 1 obesity due to excess calories with serious comorbidity and body mass index (BMI) of 31.0 to 31.9 in adult ?He is encouraged to strive for BMI less than 30 to decrease cardiac risk. Advised to aim for at least 150 minutes of exercise per week. ? ?5. Elevated PSA ?Treated with antibiotics last month for elevated PSA thought to be prostatitis, will recheck if remains elevated will refer to Urologist. ?- PSA ?- Ambulatory referral to Urology ? ?6. Chronic pain of left knee ?Comments: He would like to see orthopedics due to his pain, after review of his chart he seen [facility]  01/2021 and will give phone number to schedule appt. ?- diclofenac Sodium (VOLTAREN) 1 % GEL; Apply 2 g topically 4 (four) times daily.  Dispense: 100 g; Refill: 2 ? ?  ?Patient was given opportunity to ask que

## 2022-02-14 NOTE — Progress Notes (Signed)
?This visit occurred during the SARS-CoV-2 public health emergency.  Safety protocols were in place, including screening questions prior to the visit, additional usage of staff PPE, and extensive cleaning of exam room while observing appropriate contact time as indicated for disinfecting solutions. ? ?Patient received shingrix. It was billed through [employer] with his consent. ? ?Subjective:  ? Jesus Lam is a 88 y.o. male who presents for Medicare Annual/Subsequent preventive examination. ? ?Review of Systems    ? ?Cardiac Risk Factors include: advanced age (>56men, >52 women);dyslipidemia;hypertension;male gender;obesity (BMI >30kg/m2) ? ?   ?Objective:  ?  ?Today's Vitals  ? 02/14/22 1412 02/14/22 1418  ?BP: 126/60   ?Pulse: 62   ?Temp: 98.7 ?F (37.1 ?C)   ?TempSrc: Oral   ?SpO2: 95%   ?Weight: 234 lb 6.4 oz (106.3 kg)   ?Height: 6' (1.829 m)   ?PainSc:  7   ? ?Body mass index is 31.79 kg/m?. ? ? ?  02/14/2022  ?  2:24 PM 02/08/2021  ?  2:27 PM 02/03/2020  ?  2:48 PM 02/08/2019  ?  9:49 AM 01/28/2019  ?  3:14 PM 06/13/2018  ? 10:00 AM 06/12/2018  ?  8:14 PM  ?Advanced Directives  ?Does Patient Have a Medical Advance Directive? No No No No No No No  ?Would patient like information on creating a medical advance directive? Yes (MAU/Ambulatory/Procedural Areas - Information given)    No - Patient declined No - Patient declined No - Patient declined  ? ? ?Current Medications (verified) ?Outpatient Encounter Medications as of 02/14/2022  ?Medication Sig  ? amLODipine (NORVASC) 5 MG tablet TAKE ONE TABLET BY MOUTH DAILY  ? atorvastatin (LIPITOR) 40 MG tablet TAKE TWO TABLETS BY MOUTH DAILY  ? azelastine (ASTELIN) 0.1 % nasal spray Place 2 sprays into both nostrils 2 (two) times daily. Use in each nostril as directed  ? bisoprolol (ZEBETA) 5 MG tablet TAKE HALF TABLET BY MOUTH DAILY  ? chlorthalidone (HYGROTON) 25 MG tablet Take 1 tablet (25 mg total) by mouth daily.  ? Cholecalciferol (VITAMIN D3) 5000 units CAPS Take  5,000 Units by mouth daily.  ? clopidogrel (PLAVIX) 75 MG tablet TAKE ONE TABLET BY MOUTH DAILY  ? donepezil (ARICEPT) 10 MG tablet TAKE 1 TABLET AT BEDTIME  ? furosemide (LASIX) 20 MG tablet TAKE ONE TABLET BY MOUTH DAILY  ? gabapentin (NEURONTIN) 300 MG capsule TAKE ONE CAPSULE BY MOUTH AT BEDTIME  ? hydrALAZINE (APRESOLINE) 50 MG tablet Take 1 tablet (50 mg total) by mouth 3 (three) times daily.  ? icosapent Ethyl (VASCEPA) 1 g capsule TAKE ONE CAPSULE BY MOUTH TWICE A DAY  ? isosorbide mononitrate (IMDUR) 30 MG 24 hr tablet TAKE ONE TABLET BY MOUTH TWICE A DAY  ? ketorolac (ACULAR) 0.5 % ophthalmic solution ketorolac 0.5 % eye drops  ? levocetirizine (XYZAL) 5 MG tablet TAKE ONE TABLET BY MOUTH EVERY EVENING  ? Menthol-Methyl Salicylate (MUSCLE RUB) 10-15 % CREA muscle rub  ? methimazole (TAPAZOLE) 10 MG tablet TAKE ONE TABLET BY MOUTH DAILY  ? mirabegron ER (MYRBETRIQ) 25 MG TB24 tablet Take 1 tablet (25 mg total) by mouth daily.  ? Multiple Vitamins-Minerals (COMPLETE SENIOR PO) 1 tablet daily.   ? tamsulosin (FLOMAX) 0.4 MG CAPS capsule TAKE ONE CAPSULE BY MOUTH DAILY HALF HOUR FOLLOWING THE SAME MEAL DAILY  ? vitamin B-12 (CYANOCOBALAMIN) 500 MCG tablet Take 500 mcg by mouth daily.  ? vitamin C (ASCORBIC ACID) 500 MG tablet Take 500 mg daily by mouth.  ?  doxycycline (MONODOX) 100 MG capsule Take 1 capsule (100 mg total) by mouth 2 (two) times daily. (Patient not taking: Reported on 02/14/2022)  ? Elastic Bandages & Supports (MEDICAL COMPRESSION STOCKINGS) MISC   ? furosemide (LASIX) 40 MG tablet Take 1 tablet (40 mg total) by mouth daily. (Patient not taking: Reported on 01/10/2022)  ? NON FORMULARY e  ? ?Facility-Administered Encounter Medications as of 02/14/2022  ?Medication  ? 0.9 %  sodium chloride infusion  ? ? ?Allergies (verified) ?Shellfish allergy  ? ?History: ?Past Medical History:  ?Diagnosis Date  ? Anemia   ? BPH (benign prostatic hyperplasia)   ? Cataracts, bilateral   ? CVA (cerebral vascular  accident) ([place])   ? DVT (deep venous thrombosis) ([place])   ? dvt in left leg  ? Dyslipidemia   ? Gout   ? Gout   ? HTN (hypertension)   ? Left hip pain 03/15/2020  ? Left leg pain   ? Osteoarthritis   ? PAD (peripheral artery disease) ([place])   ? Stasis dermatitis   ? Stroke [facility])   ? Vitamin D deficiency   ? ?Past Surgical History:  ?Procedure Laterality Date  ? ABDOMINAL AORTOGRAM N/A 01/13/2018  ? Procedure: ABDOMINAL AORTOGRAM;  Surgeon: Nigel Mormon, MD;  Location: [facility];  Service: Cardiovascular;  Laterality: N/A;  ? CATARACT EXTRACTION, BILATERAL    ? LEFT HEART CATH AND CORONARY ANGIOGRAPHY N/A 01/13/2018  ? Procedure: LEFT HEART CATH AND CORONARY ANGIOGRAPHY;  Surgeon: Nigel Mormon, MD;  Location: [facility];  Service: Cardiovascular;  Laterality: N/A;  ? LOWER EXTREMITY ANGIOGRAPHY N/A 01/13/2018  ? Procedure: LOWER EXTREMITY ANGIOGRAPHY;  Surgeon: Nigel Mormon, MD;  Location: [facility];  Service: Cardiovascular;  Laterality: N/A;  ? LOWER EXTREMITY ANGIOGRAPHY Right 01/27/2018  ? Procedure: LOWER EXTREMITY ANGIOGRAPHY;  Surgeon: Adrian Prows, MD;  Location: [facility];  Service: Cardiovascular;  Laterality: Right;  ? PERIPHERAL VASCULAR ATHERECTOMY Left 01/13/2018  ? Procedure: PERIPHERAL VASCULAR ATHERECTOMY;  Surgeon: Nigel Mormon, MD;  Location: [facility];  Service: Cardiovascular;  Laterality: Left;  SFA WITH PTA DRUG COATED BALLOON  ? PERIPHERAL VASCULAR INTERVENTION  01/27/2018  ? Procedure: PERIPHERAL VASCULAR INTERVENTION;  Surgeon: Adrian Prows, MD;  Location: [facility];  Service: Cardiovascular;;  ? ?Family History  ?Problem Relation Age of Onset  ? Cancer Mother   ? Cancer Father   ? Alcohol abuse Father   ? Breast cancer Daughter   ? Stroke Daughter   ? CVA Daughter   ? ?Social History  ? ?Socioeconomic History  ? Marital status: Single  ?  Spouse name: Not on file  ? Number of children: 3  ? Years of education: Not on file   ? Highest education level: Not on file  ?Occupational History  ? Occupation: retired  ?Tobacco Use  ? Smoking status: Former  ?  Packs/day: 0.25  ?  Years: 0.50  ?  Pack years: 0.13  ?  Types: Cigarettes  ?  Quit date: 48  ?  Years since quitting: 43.2  ? Smokeless tobacco: Never  ?Vaping Use  ? Vaping Use: Never used  ?Substance and Sexual Activity  ? Alcohol use: Yes  ?  Alcohol/week: 6.0 - 7.0 standard drinks  ?  Types: 6 - 7 Shots of liquor per week  ?  Comment: 6-7 drinks per week  ? Drug use: Yes  ?  Types: Marijuana  ?  Comment: occ  ? Sexual activity: Yes  ?Other Topics Concern  ? Not on file  ?Social History Narrative  ? ** Merged History Encounter **  ?    ? Lives with daughter, Jesus Lam. 2 daughters live here in [place]. Son lives in [place]. ?3/16- offered information on local food pantries as well as congregate meal sites. Information declined.  ? ?Social Determinants of Health  ? ?Financial Resource Strain: Low Risk   ? Difficulty of Paying Living Expenses: Not hard at all  ?Food Insecurity: No Food Insecurity  ? Worried About Charity fundraiser in the Last Year: Never true  ? Ran Out of Food in the Last Year: Never true  ?Transportation Needs: No Transportation Needs  ? Lack of Transportation (Medical): No  ? Lack of Transportation (Non-Medical): No  ?Physical Activity: Sufficiently Active  ? Days of Exercise per Week: 3 days  ? Minutes of Exercise per Session: 50 min  ?Stress: No Stress Concern Present  ? Feeling of Stress : Only a little  ?Social Connections: Not on file  ? ? ?Tobacco Counseling ?Counseling given: Not Answered ? ? ?Clinical Intake: ? ?Pre-visit preparation completed: Yes ? ?Pain : 0-10 ?Pain Score: 7  ?Pain Type: Chronic pain ?Pain Location: Knee ?Pain Orientation: Left ?Pain Descriptors / Indicators: Aching ?Pain Onset: More than a month ago ?Pain Frequency: Constant ? ?  ? ?Nutritional Status: BMI > 30  Obese ?Nutritional Risks: None ?Diabetes: No ? ?How often do you  need to have someone help you when you read instructions, pamphlets, or other written materials from your doctor or pharmacy?: 1 - Never ? ?Diabetic? no ? ?Interpreter Needed?: No ? ?Information entered by

## 2022-02-14 NOTE — Patient Instructions (Signed)
Jesus Lam , ?Thank you for taking time to come for your Medicare Wellness Visit. I appreciate your ongoing commitment to your health goals. Please review the following plan we discussed and let me know if I can assist you in the future.  ? ?Screening recommendations/referrals: ?Colonoscopy: not required ?Recommended yearly ophthalmology/optometry visit for glaucoma screening and checkup ?Recommended yearly dental visit for hygiene and checkup ? ?Vaccinations: ?Influenza vaccine: completed 08/15/2021, due next flu season ?Pneumococcal vaccine: completed 09/18/2021 ?Tdap vaccine: due ?Shingles vaccine: 2nd dose due 04/23/2022   ?Covid-19: 11/09/2021, 11/15/2020, 01/29/2020, 01/17/2020 ? ?Advanced directives: Advance directive discussed with you today. I have provided a copy for you to complete at home and have notarized. Once this is complete please bring a copy in to our office so we can scan it into your chart. ? ?Conditions/risks identified: none ? ?Next appointment: Follow up in one year for your annual wellness visit.  ? ?Preventive Care 86 Years and Older, Male ?Preventive care refers to lifestyle choices and visits with your health care provider that can promote health and wellness. ?What does preventive care include? ?A yearly physical exam. This is also called an annual well check. ?Dental exams once or twice a year. ?Routine eye exams. Ask your health care provider how often you should have your eyes checked. ?Personal lifestyle choices, including: ?Daily care of your teeth and gums. ?Regular physical activity. ?Eating a healthy diet. ?Avoiding tobacco and drug use. ?Limiting alcohol use. ?Practicing safe sex. ?Taking low doses of aspirin every day. ?Taking vitamin and mineral supplements as recommended by your health care provider. ?What happens during an annual well check? ?The services and screenings done by your health care provider during your annual well check will depend on your age, overall health,  lifestyle risk factors, and family history of disease. ?Counseling  ?Your health care provider may ask you questions about your: ?Alcohol use. ?Tobacco use. ?Drug use. ?Emotional well-being. ?Home and relationship well-being. ?Sexual activity. ?Eating habits. ?History of falls. ?Memory and ability to understand (cognition). ?Work and work Astronomer. ?Screening  ?You may have the following tests or measurements: ?Height, weight, and BMI. ?Blood pressure. ?Lipid and cholesterol levels. These may be checked every 5 years, or more frequently if you are over 41 years old. ?Skin check. ?Lung cancer screening. You may have this screening every year starting at age 53 if you have a 30-pack-year history of smoking and currently smoke or have quit within the past 15 years. ?Fecal occult blood test (FOBT) of the stool. You may have this test every year starting at age 91. ?Flexible sigmoidoscopy or colonoscopy. You may have a sigmoidoscopy every 5 years or a colonoscopy every 10 years starting at age 31. ?Prostate cancer screening. Recommendations will vary depending on your family history and other risks. ?Hepatitis C blood test. ?Hepatitis B blood test. ?Sexually transmitted disease (STD) testing. ?Diabetes screening. This is done by checking your blood sugar (glucose) after you have not eaten for a while (fasting). You may have this done every 1-3 years. ?Abdominal aortic aneurysm (AAA) screening. You may need this if you are a current or former smoker. ?Osteoporosis. You may be screened starting at age 49 if you are at high risk. ?Talk with your health care provider about your test results, treatment options, and if necessary, the need for more tests. ?Vaccines  ?Your health care provider may recommend certain vaccines, such as: ?Influenza vaccine. This is recommended every year. ?Tetanus, diphtheria, and acellular pertussis (Tdap, Td) vaccine. You  may need a Td booster every 10 years. ?Zoster vaccine. You may need this  after age 64. ?Pneumococcal 13-valent conjugate (PCV13) vaccine. One dose is recommended after age 80. ?Pneumococcal polysaccharide (PPSV23) vaccine. One dose is recommended after age 5. ?Talk to your health care provider about which screenings and vaccines you need and how often you need them. ?This information is not intended to replace advice given to you by your health care provider. Make sure you discuss any questions you have with your health care provider. ?Document Released: 12/08/2015 Document Revised: 07/31/2016 Document Reviewed: 09/12/2015 ?Elsevier Interactive Patient Education ? 2017 [employer] ? ?Fall Prevention in the Home ?Falls can cause injuries. They can happen to people of all ages. There are many things you can do to make your home safe and to help prevent falls. ?What can I do on the outside of my home? ?Regularly fix the edges of walkways and driveways and fix any cracks. ?Remove anything that might make you trip as you walk through a door, such as a raised step or threshold. ?Trim any bushes or trees on the path to your home. ?Use bright outdoor lighting. ?Clear any walking paths of anything that might make someone trip, such as rocks or tools. ?Regularly check to see if handrails are loose or broken. Make sure that both sides of any steps have handrails. ?Any raised decks and porches should have guardrails on the edges. ?Have any leaves, snow, or ice cleared regularly. ?Use sand or salt on walking paths during winter. ?Clean up any spills in your garage right away. This includes oil or grease spills. ?What can I do in the bathroom? ?Use night lights. ?Install grab bars by the toilet and in the tub and shower. Do not use towel bars as grab bars. ?Use non-skid mats or decals in the tub or shower. ?If you need to sit down in the shower, use a plastic, non-slip stool. ?Keep the floor dry. Clean up any water that spills on the floor as soon as it happens. ?Remove soap buildup in the tub or  shower regularly. ?Attach bath mats securely with double-sided non-slip rug tape. ?Do not have throw rugs and other things on the floor that can make you trip. ?What can I do in the bedroom? ?Use night lights. ?Make sure that you have a light by your bed that is easy to reach. ?Do not use any sheets or blankets that are too big for your bed. They should not hang down onto the floor. ?Have a firm chair that has side arms. You can use this for support while you get dressed. ?Do not have throw rugs and other things on the floor that can make you trip. ?What can I do in the kitchen? ?Clean up any spills right away. ?Avoid walking on wet floors. ?Keep items that you use a lot in easy-to-reach places. ?If you need to reach something above you, use a strong step stool that has a grab bar. ?Keep electrical cords out of the way. ?Do not use floor polish or wax that makes floors slippery. If you must use wax, use non-skid floor wax. ?Do not have throw rugs and other things on the floor that can make you trip. ?What can I do with my stairs? ?Do not leave any items on the stairs. ?Make sure that there are handrails on both sides of the stairs and use them. Fix handrails that are broken or loose. Make sure that handrails are as long as the  stairways. ?Check any carpeting to make sure that it is firmly attached to the stairs. Fix any carpet that is loose or worn. ?Avoid having throw rugs at the top or bottom of the stairs. If you do have throw rugs, attach them to the floor with carpet tape. ?Make sure that you have a light switch at the top of the stairs and the bottom of the stairs. If you do not have them, ask someone to add them for you. ?What else can I do to help prevent falls? ?Wear shoes that: ?Do not have high heels. ?Have rubber bottoms. ?Are comfortable and fit you well. ?Are closed at the toe. Do not wear sandals. ?If you use a stepladder: ?Make sure that it is fully opened. Do not climb a closed stepladder. ?Make  sure that both sides of the stepladder are locked into place. ?Ask someone to hold it for you, if possible. ?Clearly mark and make sure that you can see: ?Any grab bars or handrails. ?First and last steps. ?W

## 2022-02-15 ENCOUNTER — Ambulatory Visit (INDEPENDENT_AMBULATORY_CARE_PROVIDER_SITE_OTHER): Payer: Medicare HMO

## 2022-02-15 DIAGNOSIS — I10 Essential (primary) hypertension: Secondary | ICD-10-CM

## 2022-02-15 DIAGNOSIS — R6889 Other general symptoms and signs: Secondary | ICD-10-CM | POA: Diagnosis not present

## 2022-02-15 DIAGNOSIS — I739 Peripheral vascular disease, unspecified: Secondary | ICD-10-CM

## 2022-02-15 LAB — PSA: Prostate Specific Ag, Serum: 8.8 ng/mL — ABNORMAL HIGH (ref 0.0–4.0)

## 2022-02-15 NOTE — Progress Notes (Signed)
? ?Chronic Care Management ?Pharmacy Note ? ?02/22/2022 ?Name:  Jesus Lam MRN:  [id number] DOB:  Apr 15, 1933 ? ?Summary: ?Patient reports that he is doing well.  ? ?Recommendations/Changes made from today's visit: ?Recommend patient consider day programs that he can go to for activities  ? ?Plan: ?Jesus Lam and his daughter are going to review programs that he can be apart of.  ? ? ?Subjective: ?Jesus Lam is an 88 y.o. year old male who is a primary patient of Minette Brine, [place].  The CCM team was consulted for assistance with disease management and care coordination needs.   ? ?Engaged with patient by telephone for follow up visit in response to provider referral for pharmacy case management and/or care coordination services. Ms [name] is a former [employer] fast streamer. She was a Occupational psychologist for two years, and she was a Web designer for 7 years.  ? ?Consent to Services:  ?The patient was given information about Chronic Care Management services, agreed to services, and gave verbal consent prior to initiation of services.  Please see initial visit note for detailed documentation.  ? ?Patient Care Team: ?Minette Brine, FNP as PCP - General (General Practice) ?Little, Claudette Stapler, RN as Case Manager ?Mayford Knife, RPH (Pharmacist) ? ?Recent office visits: ?02/14/2022 - PCP OV ? ?Recent consult visits: ?01/10/2022 Cardiology OV  ?12/30/2021 Cardiology OV  ? ?Hospital visits: ?None in previous 6 months ? ? ?Objective: ? ?Lab Results  ?Component Value Date  ? CREATININE 1.28 (H) 12/24/2021  ? BUN 21 12/24/2021  ? EGFR 54 (L) 12/24/2021  ? GFRNONAA 55 (L) 08/03/2020  ? GFRAA 64 08/03/2020  ? NA 145 (H) 12/24/2021  ? K 4.8 12/24/2021  ? CALCIUM 9.2 12/24/2021  ? CO2 24 12/24/2021  ? GLUCOSE 81 12/24/2021  ? ? ?Lab Results  ?Component Value Date/Time  ? HGBA1C 5.4 12/24/2021 04:41 PM  ? HGBA1C 5.1 02/03/2020 03:37 PM  ? MICROALBUR 10 02/08/2021 03:00 PM  ? MICROALBUR 30 02/03/2020 03:24 PM  ?  ?Last diabetic Eye  exam: No results found for: HMDIABEYEEXA  ?Last diabetic Foot exam: No results found for: HMDIABFOOTEX  ? ?Lab Results  ?Component Value Date  ? CHOL 111 12/24/2021  ? HDL 36 (L) 12/24/2021  ? [place] 51 12/24/2021  ? TRIG 135 12/24/2021  ? CHOLHDL 3.1 12/24/2021  ? ? ? ?  Latest Ref Rng & Units 02/08/2021  ?  3:29 PM 08/03/2020  ?  2:32 PM 04/27/2020  ?  2:40 PM  ?Hepatic Function  ?Total Protein 6.0 - 8.5 g/dL 7.4   7.4   7.1    ?Albumin 3.6 - 4.6 g/dL 4.0   4.2   3.9    ?AST 0 - 40 IU/L _0 ?ALT 0 - 44 IU/L _1 ?Alk Phosphatase 44 - 121 IU/L 93   109   86    ?Total Bilirubin 0.0 - 1.2 mg/dL 0.5   0.4   0.7    ? ? ?Lab Results  ?Component Value Date/Time  ? TSH 0.056 (L) 12/24/2021 04:41 PM  ? TSH 1.110 02/08/2021 03:29 PM  ? FREET4 1.13 01/28/2019 04:06 PM  ? FREET4 1.00 12/14/2018 01:38 PM  ? ? ? ?  Latest Ref Rng & Units 01/28/2019  ?  4:06 PM 11/17/2018  ?  2:46 PM 10/29/2018  ?  9:41 AM  ?CBC  ?WBC 3.4 -  10.8 x10E3/uL 6.2   6.0   4.8    ?Hemoglobin 13.0 - 17.7 g/dL 11.7   12.1   11.7    ?Hematocrit 37.5 - 51.0 % 36.4   38.7   35.7    ?Platelets 150 - 450 x10E3/uL 194   155   171    ? ? ?No results found for: VD25OH ? ?Clinical ASCVD: Yes  ?The ASCVD Risk score (Arnett DK, et al., 2019) failed to calculate for the following reasons: ?  The 2019 ASCVD risk score is only valid for ages 73 to 64 ?  The patient has a prior MI or stroke diagnosis   ? ? ?  02/14/2022  ?  2:26 PM 02/08/2021  ?  2:28 PM 02/03/2020  ?  2:49 PM  ?Depression screen PHQ 2/9  ?Decreased Interest 0 0 0  ?Down, Depressed, Hopeless 0 3 0  ?PHQ - 2 Score 0 3 0  ?Altered sleeping  0 0  ?Tired, decreased energy  3 0  ?Change in appetite  0 0  ?Feeling bad or failure about yourself   0 0  ?Trouble concentrating  3 0  ?Moving slowly or fidgety/restless  0 0  ?Suicidal thoughts  0 0  ?PHQ-9 Score  9 0  ?Difficult doing work/chores  Somewhat difficult Not difficult at all  ?  ? ?Social History  ? ?Tobacco Use  ?Smoking Status Former  ?  Packs/day: 0.25  ? Years: 0.50  ? Pack years: 0.13  ? Types: Cigarettes  ? Quit date: 48  ? Years since quitting: 43.2  ?Smokeless Tobacco Never  ? ?BP Readings from Last 3 Encounters:  ?02/14/22 126/60  ?02/14/22 126/60  ?01/10/22 (!) 145/67  ? ?Pulse Readings from Last 3 Encounters:  ?02/14/22 62  ?02/14/22 62  ?01/10/22 (!) 56  ? ?Wt Readings from Last 3 Encounters:  ?02/14/22 234 lb (106.1 kg)  ?02/14/22 234 lb 6.4 oz (106.3 kg)  ?01/10/22 238 lb (108 kg)  ? ?BMI Readings from Last 3 Encounters:  ?02/14/22 31.74 kg/m?  ?02/14/22 31.79 kg/m?  ?01/10/22 32.28 kg/m?  ? ? ?Assessment/Interventions: Review of patient past medical history, allergies, medications, health status, including review of consultants reports, laboratory and other test data, was performed as part of comprehensive evaluation and provision of chronic care management services.  ? ?SDOH:  (Social Determinants of Health) assessments and interventions performed: Yes ? ?SDOH Screenings  ? ?Alcohol Screen: Not on file  ?Depression (PHQ2-9): Low Risk   ? PHQ-2 Score: 0  ?Financial Resource Strain: Low Risk   ? Difficulty of Paying Living Expenses: Not hard at all  ?Food Insecurity: No Food Insecurity  ? Worried About Charity fundraiser in the Last Year: Never true  ? Ran Out of Food in the Last Year: Never true  ?Housing: Not on file  ?Physical Activity: Sufficiently Active  ? Days of Exercise per Week: 3 days  ? Minutes of Exercise per Session: 50 min  ?Social Connections: Not on file  ?Stress: No Stress Concern Present  ? Feeling of Stress : Only a little  ?Tobacco Use: Medium Risk  ? Smoking Tobacco Use: Former  ? Smokeless Tobacco Use: Never  ? Passive Exposure: Not on file  ?Transportation Needs: No Transportation Needs  ? Lack of Transportation (Medical): No  ? Lack of Transportation (Non-Medical): No  ? ? ?CCM Care Plan ? ?Allergies  ?Allergen Reactions  ? Shellfish Allergy Other (See Comments)  ?  Gout   ? ? ?  Medications Reviewed Today   ? ?  Reviewed by Lynne Logan, RN (Registered Nurse) on 02/19/22 at [facility] List Status: <None>  ? ?Medication Order Taking? Sig Documenting Provider Last Dose Status Informant  ?0.9 %  sodium chloride infusion [id number]   Dan Europe, Ramandeep, NP  Active   ?amLODipine (NORVASC) 5 MG tablet [id number] No TAKE ONE TABLET BY MOUTH DAILY Patwardhan, Reynold Bowen, MD Taking Active   ?atorvastatin (LIPITOR) 40 MG tablet [id number] No TAKE TWO TABLETS BY MOUTH DAILY Patwardhan, Manish J, MD Taking Active   ?azelastine (ASTELIN) 0.1 % nasal spray [id number] No Place 2 sprays into both nostrils 2 (two) times daily. Use in each nostril as directed Minette Brine, FNP Taking Active   ?bisoprolol (ZEBETA) 5 MG tablet [id number] No TAKE HALF TABLET BY MOUTH DAILY Patwardhan, Manish J, MD Taking Active   ?chlorthalidone (HYGROTON) 25 MG tablet [id number] No Take 1 tablet (25 mg total) by mouth daily. Glendale Chard, MD Taking Active   ?Cholecalciferol (VITAMIN D3) 5000 units CAPS [id number] No Take 5,000 Units by mouth daily. [provider] Taking Active Self  ?clopidogrel (PLAVIX) 75 MG tablet [id number] No TAKE ONE TABLET BY MOUTH DAILY Patwardhan, Manish J, MD Taking Active   ?diclofenac Sodium (VOLTAREN) 1 % GEL [id number] No Apply 2 g topically 4 (four) times daily. Minette Brine, FNP Taking Active   ?donepezil (ARICEPT) 10 MG tablet [id number] No TAKE 1 TABLET AT BEDTIME Minette Brine, FNP Taking Active   ?Elastic Bandages & Supports (MEDICAL COMPRESSION STOCKINGS) [place] [id number] No  [provider] Taking Active   ?furosemide (LASIX) 20 MG tablet [id number] No TAKE ONE TABLET BY MOUTH DAILY Minette Brine, FNP Taking Active   ?gabapentin (NEURONTIN) 300 MG capsule [id number] No TAKE ONE CAPSULE BY MOUTH AT BEDTIME Minette Brine, FNP Taking Active   ?hydrALAZINE (APRESOLINE) 50 MG tablet [id number] No Take 1 tablet (50 mg total) by mouth 3 (three) times daily. Patwardhan, Reynold Bowen, MD Taking Active   ?icosapent Ethyl  (VASCEPA) 1 g capsule [id number] No TAKE ONE CAPSULE BY MOUTH TWICE A DAY Patwardhan, Manish J, MD Taking Active   ?isosorbide mononitrate (IMDUR) 30 MG 24 hr tablet [id number] No TAKE ONE TABLET BY MOUTH TWIC

## 2022-02-19 ENCOUNTER — Ambulatory Visit: Payer: Self-pay

## 2022-02-19 ENCOUNTER — Telehealth: Payer: Medicare HMO

## 2022-02-19 DIAGNOSIS — I10 Essential (primary) hypertension: Secondary | ICD-10-CM

## 2022-02-19 DIAGNOSIS — E7800 Pure hypercholesterolemia, unspecified: Secondary | ICD-10-CM

## 2022-02-19 DIAGNOSIS — E0590 Thyrotoxicosis, unspecified without thyrotoxic crisis or storm: Secondary | ICD-10-CM

## 2022-02-19 DIAGNOSIS — R4182 Altered mental status, unspecified: Secondary | ICD-10-CM

## 2022-02-19 DIAGNOSIS — G8929 Other chronic pain: Secondary | ICD-10-CM

## 2022-02-19 DIAGNOSIS — R972 Elevated prostate specific antigen [PSA]: Secondary | ICD-10-CM

## 2022-02-19 NOTE — Chronic Care Management (AMB) (Signed)
?Chronic Care Management  ? ?CCM RN Visit Note ? ?02/19/2022 ?Name: Jesus Lam MRN: [id number] DOB: October 27, 1933 ? ?Subjective: ?Jesus Lam is a 88 y.o. year old male who is a primary care patient of Minette Brine, [place]. The care management team was consulted for assistance with disease management and care coordination needs.   ? ?Engaged with patient by telephone for follow up visit in response to provider referral for case management and/or care coordination services.  ? ?Consent to Services:  ?The patient was given information about Chronic Care Management services, agreed to services, and gave verbal consent prior to initiation of services.  Please see initial visit note for detailed documentation.  ? ?Patient agreed to services and verbal consent obtained.  ? ?Assessment: Review of patient past medical history, allergies, medications, health status, including review of consultants reports, laboratory and other test data, was performed as part of comprehensive evaluation and provision of chronic care management services.  ? ?SDOH (Social Determinants of Health) assessments and interventions performed:  Yes, no acute needs  ? ?CCM Care Plan ? ?Allergies  ?Allergen Reactions  ? Shellfish Allergy Other (See Comments)  ?  Gout   ? ? ?Outpatient Encounter Medications as of 02/19/2022  ?Medication Sig  ? amLODipine (NORVASC) 5 MG tablet TAKE ONE TABLET BY MOUTH DAILY  ? atorvastatin (LIPITOR) 40 MG tablet TAKE TWO TABLETS BY MOUTH DAILY  ? azelastine (ASTELIN) 0.1 % nasal spray Place 2 sprays into both nostrils 2 (two) times daily. Use in each nostril as directed  ? bisoprolol (ZEBETA) 5 MG tablet TAKE HALF TABLET BY MOUTH DAILY  ? chlorthalidone (HYGROTON) 25 MG tablet Take 1 tablet (25 mg total) by mouth daily.  ? Cholecalciferol (VITAMIN D3) 5000 units CAPS Take 5,000 Units by mouth daily.  ? clopidogrel (PLAVIX) 75 MG tablet TAKE ONE TABLET BY MOUTH DAILY  ? diclofenac Sodium (VOLTAREN) 1 % GEL Apply 2 g  topically 4 (four) times daily.  ? donepezil (ARICEPT) 10 MG tablet TAKE 1 TABLET AT BEDTIME  ? Elastic Bandages & Supports (MEDICAL COMPRESSION STOCKINGS) MISC   ? furosemide (LASIX) 20 MG tablet TAKE ONE TABLET BY MOUTH DAILY  ? gabapentin (NEURONTIN) 300 MG capsule TAKE ONE CAPSULE BY MOUTH AT BEDTIME  ? hydrALAZINE (APRESOLINE) 50 MG tablet Take 1 tablet (50 mg total) by mouth 3 (three) times daily.  ? icosapent Ethyl (VASCEPA) 1 g capsule TAKE ONE CAPSULE BY MOUTH TWICE A DAY  ? isosorbide mononitrate (IMDUR) 30 MG 24 hr tablet TAKE ONE TABLET BY MOUTH TWICE A DAY  ? ketorolac (ACULAR) 0.5 % ophthalmic solution ketorolac 0.5 % eye drops  ? levocetirizine (XYZAL) 5 MG tablet TAKE ONE TABLET BY MOUTH EVERY EVENING  ? Menthol-Methyl Salicylate (MUSCLE RUB) 10-15 % CREA muscle rub  ? methimazole (TAPAZOLE) 10 MG tablet TAKE ONE TABLET BY MOUTH DAILY  ? mirabegron ER (MYRBETRIQ) 25 MG TB24 tablet Take 1 tablet (25 mg total) by mouth daily.  ? Multiple Vitamins-Minerals (COMPLETE SENIOR PO) 1 tablet daily.   ? tamsulosin (FLOMAX) 0.4 MG CAPS capsule TAKE ONE CAPSULE BY MOUTH DAILY HALF HOUR FOLLOWING THE SAME MEAL DAILY  ? vitamin B-12 (CYANOCOBALAMIN) 500 MCG tablet Take 500 mcg by mouth daily.  ? vitamin C (ASCORBIC ACID) 500 MG tablet Take 500 mg daily by mouth.  ? ?Facility-Administered Encounter Medications as of 02/19/2022  ?Medication  ? 0.9 %  sodium chloride infusion  ? ? ?Patient Active Problem List  ? Diagnosis Date Noted  ?  Leg edema 08/01/2021  ? Mild cognitive impairment 09/07/2020  ? Chronic pain of left knee 03/15/2020  ? Pure hypercholesterolemia 11/15/2019  ? First degree AV block 07/28/2019  ? Hypertriglyceridemia 04/29/2019  ? Mixed hyperlipidemia 04/29/2019  ? CVA (cerebral vascular accident) [facility])   ? Bradycardia   ? Subclinical hyperthyroidism 11/18/2018  ? Peripheral artery disease ([place]) 06/13/2018  ? Ischemic cerebrovascular accident (CVA) ([place]) 06/13/2018  ? Dysarthria 06/12/2018  ? Renal  insufficiency 06/12/2018  ? Claudication ([place]) 01/11/2018  ? Anemia 10/10/2017  ? Essential hypertension 11/04/2013  ? ? ?Conditions to be addressed/monitored: HTN, Hyperthyroidism, Altered memory,  Bilateral hand pain, Medication management, Pure hypercholesterolemia, Chronic pain of left knee  ? ?Care Plan : RN Care Manager Plan of Care  ?Updates made by Lynne Logan, RN since 02/19/2022 12:00 AM  ?  ? ?Problem: No Plan of Care established for management of chronic disease states (HTN, Hyperthyroidism, Altered memory,  Bilateral hand pain, Medication management, Pure hypercholesterolemia, Chronic pain of left knee)   ?Priority: High  ?  ? ?Long-Range Goal: Establishment of plan of care for management of chronic disease states (HTN, Hyperthyroidism, Altered memory,  Bilateral hand pain, Medication management, Pure hypercholesterolemia, Chronic pain of left knee)   ?Start Date: 12/13/2021  ?Expected End Date: 12/13/2022  ?Recent Progress: On track  ?Priority: High  ?Note:   ?Current Barriers:  ?Knowledge Deficits related to plan of care for management of HTN, Hyperthyroidism, Altered memory,  Bilateral hand pain, Medication management, Pure hypercholesterolemia, Chronic pain of left knee   ?Chronic Disease Management support and education needs related to HTN, Hyperthyroidism, Altered memory,  Bilateral hand pain, Medication management, Pure hypercholesterolemia, Chronic pain of left knee   ? ?RNCM Clinical Goal(s):  ?Patient will verbalize basic understanding of  HTN, Hyperthyroidism, Altered memory,  Bilateral hand pain, Medication management, Pure hypercholesterolemia, Chronic pain of left knee  disease process and self health management plan as evidenced by patient will report having no disease exacerbations related to his chronic disease states listed above  ?take all medications exactly as prescribed and will call provider for medication related questions as evidenced by patient will demonstrate improved  understanding of prescribed medications and rationale for usage as evidenced by patient teach back ?demonstrate Improved health management independence as evidenced by patient will maintain current mobility and fuctionality ?continue to work with RN Care Manager to address care management and care coordination needs related to  HTN, Hyperthyroidism, Altered memory,  Bilateral hand pain, Medication management, Pure hypercholesterolemia, Chronic pain of left knee  as evidenced by adherence to CM Team Scheduled appointments through collaboration with RN Care manager, provider, and care team.  ? ?Interventions: ?1:1 collaboration with primary care provider regarding development and update of comprehensive plan of care as evidenced by provider attestation and co-signature ?Inter-disciplinary care team collaboration (see longitudinal plan of care) ?Evaluation of current treatment plan related to  self management and patient's adherence to plan as established by provider ? ? Chronic Kidney Disease Interventions:  (Status:  Condition stable.  Not addressed this visit.) Long Term Goal ?Assessed the Patient understanding of chronic kidney disease    ?Evaluation of current treatment plan related to chronic kidney disease self management and patient's adherence to plan as established by provider      ?Reviewed prescribed diet increase daily water intake to 64 oz  ?Discussed the impact of chronic kidney disease on daily life and mental health and acknowledged and normalized feelings of disempowerment, fear, and frustration    ?  Provided education on kidney disease progression    ?Determined patient is experiencing urinary frequency with nocturia, denies other symptoms but states this is a new problem that started about 1 month ago ?Educated patient on s/s suggestive UTI, educated on other potential causes and complications if left undiagnosed/treated  ?Determined patient feels he is staying well hydrated as evidence by patient is  drinking 4-16.9 oz of bottled water daily  ?Encouraged patient to schedule a PCP visit for further evaluation/treatment, placed patient on PCP schedule for 12/17/21 @3 :00 PM, daughter will provide transportation  ?

## 2022-02-19 NOTE — Patient Instructions (Signed)
Visit Information ? ?Thank you for taking time to visit with me today. Please don't hesitate to contact me if I can be of assistance to you before our next scheduled telephone appointment. ? ?Following are the goals we discussed today:  ?(Copy and paste patient goals from clinical care plan here) ? ?Our next appointment is by telephone on 04/12/22 at 10:30 AM ? ?Please call the care guide team at [phone] if you need to cancel or reschedule your appointment.  ? ?If you are experiencing a Mental Health or Behavioral Health Crisis or need someone to talk to, please call 1-800-273-TALK (toll free, 24 hour hotline)  ? ?The patient verbalized understanding of instructions, educational materials, and care plan provided today and agreed to receive a mailed copy of patient instructions, educational materials, and care plan.  ? ?[name], RN, BSN, CCM ?Care Management Coordinator ?[facility] Care Management/[facility]  ?Direct Phone: [phone] ? ? ?

## 2022-02-20 DIAGNOSIS — R6889 Other general symptoms and signs: Secondary | ICD-10-CM | POA: Diagnosis not present

## 2022-02-21 DIAGNOSIS — H5203 Hypermetropia, bilateral: Secondary | ICD-10-CM | POA: Diagnosis not present

## 2022-02-21 DIAGNOSIS — H524 Presbyopia: Secondary | ICD-10-CM | POA: Diagnosis not present

## 2022-02-21 DIAGNOSIS — H52209 Unspecified astigmatism, unspecified eye: Secondary | ICD-10-CM | POA: Diagnosis not present

## 2022-02-22 DIAGNOSIS — E7800 Pure hypercholesterolemia, unspecified: Secondary | ICD-10-CM

## 2022-02-22 DIAGNOSIS — I10 Essential (primary) hypertension: Secondary | ICD-10-CM

## 2022-02-22 DIAGNOSIS — R6889 Other general symptoms and signs: Secondary | ICD-10-CM | POA: Diagnosis not present

## 2022-02-22 NOTE — Patient Instructions (Addendum)
Visit Information ?It was great speaking with you today!  Please let me know if you have any questions about our visit. ? ? Goals Addressed   ? ?  ?  ?  ?  ? This Visit's Progress  ?  Manage My Medicine     ?  Timeframe:  Long-Range Goal ?Priority:  High ?Start Date:                             ?Expected End Date:                      ? ?Follow Up Date 08/30/2022 ? ?In Progress:   ?- call for medicine refill 2 or 3 days before it runs out ?- call if I am sick and can't take my medicine ?- keep a list of all the medicines I take; vitamins and herbals too ?- use an alarm clock or phone to remind me to take my medicine  ?  ?Why is this important?   ?These steps will help you keep on track with your medicines. ?  ?Notes:  ?Please call the pharmacy team with any questions you may have.  ?  ?  Track and Manage My Blood Pressure-Hypertension     ?  Timeframe:  Long-Range Goal ?Priority:  High ?Start Date:                             ?Expected End Date:                      ? ?Follow Up Date 08/30/2022  ?  ?In Progress: ?- check blood pressure daily ?- write blood pressure results in a log or diary  ?  ?Why is this important?   ?You won't feel high blood pressure, but it can still hurt your blood vessels.  ?High blood pressure can cause heart or kidney problems. It can also cause a stroke.  ?Making lifestyle changes like losing a little weight or eating less salt will help.  ?Checking your blood pressure at home and at different times of the day can help to control blood pressure.  ?If the doctor prescribes medicine remember to take it the way the doctor ordered.  ?Call the office if you cannot afford the medicine or if there are questions about it.   ?  ?Notes:  ?Please call if you have any questions  ?  ? ?  ? ?Patient Care Plan: CCM Pharmacy Care Plan  ?  ? ?Problem Identified: HTN, PAD   ?Priority: High  ?  ? ?Long-Range Goal: Disease Management   ?Recent Progress: On track  ?Note:   ? ? ?Current Barriers:  ?Unable to  independently monitor therapeutic efficacy ? ?Pharmacist Clinical Goal(s):  ?Patient will achieve adherence to monitoring guidelines and medication adherence to achieve therapeutic efficacy through collaboration with PharmD and provider.  ? ?Interventions: ?1:1 collaboration with [name], FNP regarding development and update of comprehensive plan of care as evidenced by provider attestation and co-signature ?Inter-disciplinary care team collaboration (see longitudinal plan of care) ?Comprehensive medication review performed; medication list updated in electronic medical record ? ?Hypertension (BP goal <130/80) ?-Controlled ?-Current treatment: ?Amlodipine 5 mg tablet once per day Appropriate, Effective, Safe, Accessible ?Bisoprolol 5 mg tablet once per day Appropriate, Effective, Safe, Accessible ?Chlorthalidone 25 mg tablet once per dayAppropriate, Effective, Safe, Accessible ?Isosorbide  20 mg tablet twice per day Appropriate, Effective, Safe, Accessible ?Hydralazine 50 mg tablet three times per day Appropriate, Effective, Safe, Accessible ?-Current home readings: he is checking his BP every day and the one for today was 138/69 pulse: 59  ?-Current dietary habits:  He eats a lot of vegetables with his daughter  ?-Current exercise habits: [facility] goes to [facility] and swims, and do the low impact exercises with silver sneakers.  ?-His daughter encourages him to participate in these activities ?-Denies hypotensive/hypertensive symptoms ?-Educated on BP goals and benefits of medications for prevention of heart attack, stroke and kidney damage; ?Daily salt intake goal < 2300 mg; ?Exercise goal of 150 minutes per week; ?-Counseled to monitor BP at home at least once per day, document, and provide log at future appointments ?-[facility]  ?-Mr. Braman is drinking three to four bottles of water per day  ?-Recommended to continue current medication ? ? ?PAD: (LDL goal <  55) ?-Controlled ?-Current treatment: ?Atorvastatin 40 mg tablet once per day Appropriate, Effective, Safe, Accessible ?Vascepa 1 gram - take one capsule by mouth twice daily Appropriate, Effective, Safe, Accessible ?Plavix 75 mg tablet once per day Appropriate, Effective, Safe, Accessible ?-Current dietary patterns: will follow up with patient during next visit  ?-Current exercise habits: he is going to the [facility] and does swim aerobics.  ?-Educated on Cholesterol goals;  ?Benefits of statin for ASCVD risk reduction; ?Importance of limiting foods high in cholesterol; ?Strategies to manage statin-induced myalgias; ?-Recommended to continue current medication ? ? ?Patient Goals/Self-Care Activities ?Patient will:  ?- take medications as prescribed as evidenced by patient report and record review ? ?Follow Up Plan: The patient has been provided with contact information for the care management team and has been advised to call with any health related questions or concerns.  ?  ? ? ?Patient agreed to services and verbal consent obtained.  ? ?The patient verbalized understanding of instructions, educational materials, and care plan provided today and agreed to receive a mailed copy of patient instructions, educational materials, and care plan.  ? ?Cherylin Mylar, PharmD ?Clinical Pharmacist ?[facility] ?[phone] ?  ?

## 2022-02-25 DIAGNOSIS — R6889 Other general symptoms and signs: Secondary | ICD-10-CM | POA: Diagnosis not present

## 2022-02-27 ENCOUNTER — Other Ambulatory Visit: Payer: Self-pay | Admitting: Nurse Practitioner

## 2022-02-27 DIAGNOSIS — E0590 Thyrotoxicosis, unspecified without thyrotoxic crisis or storm: Secondary | ICD-10-CM

## 2022-03-01 DIAGNOSIS — H40013 Open angle with borderline findings, low risk, bilateral: Secondary | ICD-10-CM | POA: Diagnosis not present

## 2022-03-01 DIAGNOSIS — I10 Essential (primary) hypertension: Secondary | ICD-10-CM | POA: Diagnosis not present

## 2022-03-04 ENCOUNTER — Other Ambulatory Visit: Payer: Self-pay | Admitting: Nurse Practitioner

## 2022-03-04 ENCOUNTER — Other Ambulatory Visit: Payer: Self-pay | Admitting: Cardiology

## 2022-03-04 DIAGNOSIS — R350 Frequency of micturition: Secondary | ICD-10-CM

## 2022-03-04 DIAGNOSIS — R351 Nocturia: Secondary | ICD-10-CM

## 2022-03-04 DIAGNOSIS — I739 Peripheral vascular disease, unspecified: Secondary | ICD-10-CM

## 2022-03-05 NOTE — Telephone Encounter (Signed)
I am not able to refill it can you please

## 2022-03-26 ENCOUNTER — Other Ambulatory Visit: Payer: Self-pay

## 2022-03-26 ENCOUNTER — Encounter: Payer: Medicare HMO | Admitting: Nurse Practitioner

## 2022-03-26 ENCOUNTER — Ambulatory Visit (HOSPITAL_COMMUNITY)
Admission: EM | Admit: 2022-03-26 | Discharge: 2022-03-26 | Disposition: A | Discharge status: Home / Self Care | Payer: Medicare HMO | Attending: Family Medicine | Admitting: Family Medicine

## 2022-03-26 ENCOUNTER — Encounter (HOSPITAL_COMMUNITY): Payer: Self-pay | Admitting: Emergency Medicine

## 2022-03-26 VITALS — BP 118/70 | HR 60 | Temp 98.1°F | Ht 72.0 in | Wt 230.4 lb

## 2022-03-26 DIAGNOSIS — R300 Dysuria: Secondary | ICD-10-CM | POA: Insufficient documentation

## 2022-03-26 DIAGNOSIS — Z23 Encounter for immunization: Secondary | ICD-10-CM

## 2022-03-26 LAB — POCT URINALYSIS DIPSTICK, ED / UC
Bilirubin Urine: NEGATIVE
Glucose, UA: NEGATIVE mg/dL
Ketones, ur: NEGATIVE mg/dL
Nitrite: NEGATIVE
Protein, ur: 100 mg/dL — AB
Specific Gravity, Urine: 1.015 (ref 1.005–1.030)
Urobilinogen, UA: 0.2 mg/dL (ref 0.0–1.0)
pH: 6 (ref 5.0–8.0)

## 2022-03-26 MED ORDER — SULFAMETHOXAZOLE-TRIMETHOPRIM 800-160 MG PO TABS: 1.0000 | ORAL_TABLET | Freq: Two times a day (BID) | ORAL | 0 refills | Status: AC

## 2022-03-26 NOTE — Progress Notes (Signed)
Pt presents today for tdap.  ?

## 2022-03-26 NOTE — Discharge Instructions (Signed)
You have had labs (urine culture) sent today. We will call you with any significant abnormalities or if there is need to begin or change treatment or pursue further follow up.  You may also review your test results online through MyChart. If you do not have a MyChart account, instructions to sign up should be on your discharge paperwork.  

## 2022-03-26 NOTE — ED Notes (Signed)
Urine in lab 

## 2022-03-26 NOTE — ED Triage Notes (Signed)
Painful urination started one month ago.   ?

## 2022-03-27 NOTE — ED Provider Notes (Signed)
?[facility] ? ? ? ?ASSESSMENT & PLAN: ? ?1. Dysuria   ? ?Labs Reviewed  ?POCT URINALYSIS DIPSTICK, ED / UC - Abnormal; Notable for the following components:  ?    Result Value  ? Hgb urine dipstick SMALL (*)   ? Protein, ur 100 (*)   ? Leukocytes,Ua LARGE (*)   ? All other components within normal limits  ?URINE CULTURE  ? ?Will tx for UTI. No urinary hesitancy. ?Begin: ?Meds ordered this encounter  ?Medications  ? sulfamethoxazole-trimethoprim (BACTRIM DS) 800-160 MG tablet  ?  Sig: Take 1 tablet by mouth 2 (two) times daily for 10 days.  ?  Dispense:  14 tablet  ?  Refill:  0  ? ?No signs of pyelonephritis. ?Urine culture sent.  ?Will follow up with his PCP or here if not showing improvement over the next 48 hours, sooner if needed. ? ?Outlined signs and symptoms indicating need for more acute intervention. ?Patient verbalized understanding. ?After Visit Summary given. ? ?SUBJECTIVE: ? ?Jesus Lam is a 88 y.o. male who complains of urinary frequency and mild dysuria for the past sev weeks; worse past few days. Without associated flank pain, fever, chills, penild discharge or bleeding. Gross hematuria: not present. No specific aggravating or alleviating factors reported. No LE edema. Normal PO intake without n/v/d. Without specific abdominal pain. Ambulatory without difficulty. ?OTC treatment: none. ?H/O UTI: none reported. ? ?OBJECTIVE: ? ?Vitals:  ? 03/26/22 1606  ?BP: 140/64  ?Pulse: 66  ?Resp: 20  ?Temp: 98 ?F (36.7 ?C)  ?TempSrc: Oral  ?SpO2: 97%  ? ?General appearance: alert; no distress ?HENT: oropharynx: moist ?Lungs: unlabored respirations ?Abdomen: soft, non-tender; bowel sounds normal; no masses or organomegaly; no guarding or rebound tenderness ?Back: no CVA tenderness ?Extremities: no edema; symmetrical with no gross deformities ?Skin: warm and dry ?Neurologic: normal gait ?Psychological: alert and cooperative; normal mood and affect ? ?Labs Reviewed  ?POCT URINALYSIS DIPSTICK, ED / UC  - Abnormal; Notable for the following components:  ?    Result Value  ? Hgb urine dipstick SMALL (*)   ? Protein, ur 100 (*)   ? Leukocytes,Ua LARGE (*)   ? All other components within normal limits  ?URINE CULTURE  ? ? ?Allergies  ?Allergen Reactions  ? Shellfish Allergy Other (See Comments)  ?  Gout   ? ? ?Past Medical History:  ?Diagnosis Date  ? Anemia   ? BPH (benign prostatic hyperplasia)   ? Cataracts, bilateral   ? CVA (cerebral vascular accident) (HCC)   ? DVT (deep venous thrombosis) (HCC)   ? dvt in left leg  ? Dyslipidemia   ? Gout   ? Gout   ? HTN (hypertension)   ? Left hip pain 03/15/2020  ? Left leg pain   ? Osteoarthritis   ? PAD (peripheral artery disease) (HCC)   ? Stasis dermatitis   ? Stroke [facility](HCC)   ? Vitamin D deficiency   ? ?Social History  ? ?Socioeconomic History  ? Marital status: Single  ?  Spouse name: Not on file  ? Number of children: 3  ? Years of education: Not on file  ? Highest education level: Not on file  ?Occupational History  ? Occupation: retired  ?Tobacco Use  ? Smoking status: Former  ?  Packs/day: 0.25  ?  Years: 0.50  ?  Pack years: 0.13  ?  Types: Cigarettes  ?  Quit date: 191980  ?  Years since quitting: 43.3  ? Smokeless  tobacco: Never  ?Vaping Use  ? Vaping Use: Never used  ?Substance and Sexual Activity  ? Alcohol use: Yes  ?  Alcohol/week: 6.0 - 7.0 standard drinks  ?  Types: 6 - 7 Shots of liquor per week  ?  Comment: 6-7 drinks per week  ? Drug use: Yes  ?  Types: Marijuana  ?  Comment: occ  ? Sexual activity: Yes  ?Other Topics Concern  ? Not on file  ?Social History Narrative  ? ** Merged History Encounter **  ?    ? Lives with daughter, [name]. 2 daughters live here in [place]. Son lives in [place]. ?3/16- offered information on local food pantries as well as congregate meal sites. Information declined.  ? ?Social Determinants of Health  ? ?Financial Resource Strain: Low Risk   ? Difficulty of Paying Living Expenses: Not hard at all  ?Food Insecurity: No  Food Insecurity  ? Worried About Programme researcher, broadcasting/film/video in the Last Year: Never true  ? Ran Out of Food in the Last Year: Never true  ?Transportation Needs: No Transportation Needs  ? Lack of Transportation (Medical): No  ? Lack of Transportation (Non-Medical): No  ?Physical Activity: Sufficiently Active  ? Days of Exercise per Week: 3 days  ? Minutes of Exercise per Session: 50 min  ?Stress: No Stress Concern Present  ? Feeling of Stress : Only a little  ?Social Connections: Not on file  ?Intimate Partner Violence: Not on file  ? ?Family History  ?Problem Relation Age of Onset  ? Cancer Mother   ? Cancer Father   ? Alcohol abuse Father   ? Breast cancer Daughter   ? Stroke Daughter   ? CVA Daughter   ? ? ? ? ?  ?Mardella Layman, MD ?03/27/22 [phone] ? ?

## 2022-03-28 ENCOUNTER — Ambulatory Visit: Payer: Medicare HMO | Admitting: Nurse Practitioner

## 2022-03-29 ENCOUNTER — Telehealth (HOSPITAL_COMMUNITY): Payer: Self-pay | Admitting: Emergency Medicine

## 2022-03-29 LAB — URINE CULTURE: Culture: >=100000 — AB

## 2022-03-29 MED ORDER — AMPICILLIN 500 MG PO CAPS: 500.0000 mg | ORAL_CAPSULE | Freq: Four times a day (QID) | ORAL | 0 refills | Status: AC

## 2022-03-31 DIAGNOSIS — I10 Essential (primary) hypertension: Secondary | ICD-10-CM | POA: Diagnosis not present

## 2022-04-10 ENCOUNTER — Ambulatory Visit: Payer: Medicare HMO | Admitting: Cardiology

## 2022-04-12 ENCOUNTER — Telehealth: Payer: Self-pay

## 2022-04-12 ENCOUNTER — Telehealth: Payer: Medicare HMO

## 2022-04-12 NOTE — Telephone Encounter (Signed)
  Care Management   Follow Up Note   04/12/2022 Name: Jesus Lam MRN: [id number] DOB: 1933/03/03   Referred by: Minette Brine, FNP Reason for referral : No chief complaint on file.   An unsuccessful telephone outreach was attempted today. The patient was referred to the case management team for assistance with care management and care coordination.   Follow Up Plan: A HIPPA compliant phone message was left for the patient providing contact information and requesting a return call.   Barb Merino, RN, BSN, CCM Care Management Coordinator [facility]  Direct Phone: [phone]

## 2022-04-16 ENCOUNTER — Ambulatory Visit: Payer: Medicare HMO | Admitting: Cardiology

## 2022-04-16 ENCOUNTER — Encounter: Payer: Self-pay | Admitting: Cardiology

## 2022-04-16 VITALS — BP 120/60 | HR 49 | Temp 97.8°F | Resp 16 | Ht 72.0 in | Wt 231.0 lb

## 2022-04-16 DIAGNOSIS — E782 Mixed hyperlipidemia: Secondary | ICD-10-CM

## 2022-04-16 DIAGNOSIS — I10 Essential (primary) hypertension: Secondary | ICD-10-CM | POA: Diagnosis not present

## 2022-04-16 DIAGNOSIS — I739 Peripheral vascular disease, unspecified: Secondary | ICD-10-CM | POA: Diagnosis not present

## 2022-04-16 NOTE — Progress Notes (Signed)
Subjective:   Jesus Lam, male    DOB: 04/29/1933, 88 y.o.   MRN: [id number]   Chief complaint:  PAD   88 year old African-American male with severe bilateral PAD with class III claudication, now s/p successful DCB Rt SFA and 2 overlapping Zilver PTX stents placed left SFA (12/2017), s/p left temporal occipital cortical infarct (05/2018).  Patient is doing well. Blood pressure is much better after starting methimazole. Claudication is not lifestyle limiting. He still swims regularly without any difficulty.    Current Outpatient Medications:    amLODipine (NORVASC) 5 MG tablet, TAKE ONE TABLET BY MOUTH DAILY, Disp: 90 tablet, Rfl: 7   atorvastatin (LIPITOR) 40 MG tablet, TAKE TWO TABLETS BY MOUTH DAILY, Disp: 90 tablet, Rfl: 4   azelastine (ASTELIN) 0.1 % nasal spray, Place 2 sprays into both nostrils 2 (two) times daily. Use in each nostril as directed, Disp: 30 mL, Rfl: 5   bisoprolol (ZEBETA) 5 MG tablet, TAKE HALF TABLET BY MOUTH DAILY, Disp: 60 tablet, Rfl: 0   chlorthalidone (HYGROTON) 25 MG tablet, Take 1 tablet (25 mg total) by mouth daily., Disp: 5 tablet, Rfl: 0   Cholecalciferol (VITAMIN D3) 5000 units CAPS, Take 5,000 Units by mouth daily., Disp: , Rfl:    clopidogrel (PLAVIX) 75 MG tablet, TAKE ONE TABLET BY MOUTH DAILY, Disp: 90 tablet, Rfl: 10   diclofenac Sodium (VOLTAREN) 1 % GEL, Apply 2 g topically 4 (four) times daily., Disp: 100 g, Rfl: 2   donepezil (ARICEPT) 10 MG tablet, TAKE 1 TABLET AT BEDTIME, Disp: 90 tablet, Rfl: 1   Elastic Bandages & Supports (MEDICAL COMPRESSION STOCKINGS) MISC, , Disp: , Rfl:    furosemide (LASIX) 20 MG tablet, TAKE ONE TABLET BY MOUTH DAILY, Disp: 90 tablet, Rfl: 4   gabapentin (NEURONTIN) 300 MG capsule, TAKE ONE CAPSULE BY MOUTH AT BEDTIME, Disp: 90 capsule, Rfl: 0   hydrALAZINE (APRESOLINE) 50 MG tablet, Take 1 tablet (50 mg total) by mouth 3 (three) times daily., Disp: 270 tablet, Rfl: 3   icosapent Ethyl (VASCEPA) 1 g capsule,  TAKE ONE CAPSULE BY MOUTH TWICE A DAY, Disp: 60 capsule, Rfl: 0   isosorbide mononitrate (IMDUR) 30 MG 24 hr tablet, TAKE ONE TABLET BY MOUTH TWICE A DAY, Disp: 60 tablet, Rfl: 0   ketorolac (ACULAR) 0.5 % ophthalmic solution, ketorolac 0.5 % eye drops, Disp: , Rfl:    levocetirizine (XYZAL) 5 MG tablet, TAKE ONE TABLET BY MOUTH EVERY EVENING, Disp: 90 tablet, Rfl: 4   Menthol-Methyl Salicylate (MUSCLE RUB) 10-15 % CREA, muscle rub, Disp: , Rfl:    methimazole (TAPAZOLE) 10 MG tablet, TAKE ONE TABLET BY MOUTH DAILY, Disp: 30 tablet, Rfl: 0   Multiple Vitamins-Minerals (COMPLETE SENIOR PO), 1 tablet daily. , Disp: , Rfl:    MYRBETRIQ 25 MG TB24 tablet, TAKE ONE TABLET BY MOUTH DAILY, Disp: 30 tablet, Rfl: 1   tamsulosin (FLOMAX) 0.4 MG CAPS capsule, TAKE ONE CAPSULE BY MOUTH DAILY HALF HOUR FOLLOWING THE SAME MEAL DAILY, Disp: 90 capsule, Rfl: 4   vitamin B-12 (CYANOCOBALAMIN) 500 MCG tablet, Take 500 mcg by mouth daily., Disp: , Rfl:    vitamin C (ASCORBIC ACID) 500 MG tablet, Take 500 mg daily by mouth., Disp: , Rfl:   Current Facility-Administered Medications:    0.9 %  sodium chloride infusion, , Intravenous, PRN, Ghumman, Ramandeep, NP  Cardiovascular studies:  EKG 04/16/2022: Sinus bradycardia 48 bpm First degree A-V block  RBBB, LAFB Old anteroseptal infarct Nonspecific T wave abnormality  ABI 02/05/2018: This exam reveals moderately decreased perfusion of the lower extremity, noted at  the post tibial artery level (ABI). Right ABI 0.79 and left ABI 0.71. Compared to  11/19/2017:   LABI 0.67 and RABI 0.59.  Periperal angiography 01/27/2018:  Right mid and distal SFA CTO, moderate calcification evident. Successful PTA and stenting of the CTO right mid and distal SFA with antegrade left femoral and retrograde right PT access and implantation of 2 overlapping 6.0 x 1 20 mm Zilver PTX drug-coated stents. Stenosis reduced from 100% to 0%. Brisk flow was evident at the end of the  procedure..  Echocardiogram 07/24/2018: 1. Left ventricle cavity is normal in size. Mild concentric hypertrophy of the left ventricle. Doppler evidence of grade I (impaired) diastolic dysfunction, normal LAP. Left ventricle regional wall motion findings: Basal and mid inferior hypokinesis. Calculated EF 46%. 2. Moderate aortic valve leaflet thickening. Mildly restricted aortic valve leaflets. No evidence of aortic valve stenosis. Mild (Grade I) aortic regurgitation. 3. Structurally normal mitral valve with mild (Grade I) regurgitation. 4. Compared to the study done on 09/20/2017, no significant change in LVEF. Wall motion appears to be new. Previously mitral regurgitation was mild to moderate.   Recent labs: 12/24/2021: Glucose 81, BUN/Cr 21/1.28. EGFR 54. Na/K 145/4.8.  HbA1C 5.4% Chol 111, TG 135, HDL 36, LDL 51 TSH 0.05 very low Free T3, T4 normal   02/08/2021: Glucose 90, BUN/Cr 15/1.39. EGFR 49. Na/K 136/5.1. Rest of the CMP normal Chol 124, TG 203, HDL 32, LDL 59 TSH 1.1 normal  04/27/2020: Glucose 86, BUN/Cr 12/1.16. EGFR 66. Na/K 141/4.8. Rest of the CMP normal Chol 120, TG 165, HDL 34, LDL 58 TSH 0.9 normal  02/03/2020: Glucose 81, BUN/Cr 16/1.1. EGFR 70. Na/K 142/4.1. Rest of the CMP normal HbA1C 5.1% Chol 140, TG 253, HDL 31, LDL 68 TSH 8.5, elevated  12/29/2019: H/H 11.7/36.4. MCV 92. Platelets 194 Chol 108, TG 143, HDL 32, LDL 51   Review of Systems  Cardiovascular:  Positive for claudication (Left leg). Negative for chest pain, dyspnea on exertion, leg swelling, palpitations and syncope.  Musculoskeletal:  Positive for joint pain.      Vitals:   04/16/22 1036  BP: 120/60  Pulse: (!) 49  Resp: 16  Temp: 97.8 F (36.6 C)  SpO2: 93%     Body mass index is 31.33 kg/m. Filed Weights   04/16/22 1036  Weight: 231 lb (104.8 kg)      Objective:    Physical Exam Vitals and nursing note reviewed.  Constitutional:      Appearance: He is well-developed.   Neck:     Vascular: No JVD.  Cardiovascular:     Rate and Rhythm: Normal rate and regular rhythm.     Pulses:          Femoral pulses are 3+ on the right side and 3+ on the left side.      Popliteal pulses are 1+ on the right side and 1+ on the left side.       Dorsalis pedis pulses are 0 on the right side and 0 on the left side.       Posterior tibial pulses are 0 on the right side and 0 on the left side.     Heart sounds: Normal heart sounds. No murmur heard. Pulmonary:     Effort: Pulmonary effort is normal.     Breath sounds: Normal breath sounds. No wheezing or rales.  Musculoskeletal:     Right lower  leg: No edema.     Left lower leg: No edema.          Assessment & Recommendations:   88 year old African-American male with PAD, h/o left temporal occipital cortical infarct (05/2018).  PAD: DCB Rt SFA and 2 overlapping Zilver PTX stents placed left SFA (12/2017). B/l moderately reduced ABI, details above. However, his bl knee pain more likely due to osteoarthritis. Cointroleld at this time. Continue medical management.  Hypertension: Much better controlled.   Hypertriglyceridemia: Improved on lipitor and vascepa.  F/u in 6 months  Lenford Beddow Esther Hardy, MD [facility] Pager: [phone] Office: [phone] If no answer Cell [phone]

## 2022-04-23 ENCOUNTER — Ambulatory Visit: Payer: Medicare HMO

## 2022-04-25 ENCOUNTER — Ambulatory Visit (INDEPENDENT_AMBULATORY_CARE_PROVIDER_SITE_OTHER): Payer: Medicare HMO

## 2022-04-25 ENCOUNTER — Telehealth: Payer: Medicare HMO

## 2022-04-25 DIAGNOSIS — G8929 Other chronic pain: Secondary | ICD-10-CM

## 2022-04-25 DIAGNOSIS — M79642 Pain in left hand: Secondary | ICD-10-CM

## 2022-04-25 DIAGNOSIS — R4182 Altered mental status, unspecified: Secondary | ICD-10-CM

## 2022-04-25 DIAGNOSIS — E7800 Pure hypercholesterolemia, unspecified: Secondary | ICD-10-CM

## 2022-04-25 DIAGNOSIS — I10 Essential (primary) hypertension: Secondary | ICD-10-CM

## 2022-04-25 NOTE — Patient Instructions (Signed)
Visit Information  Thank you for taking time to visit with me today. Please don't hesitate to contact me if I can be of assistance to you before our next scheduled telephone appointment.  Following are the goals we discussed today:  (Copy and paste patient goals from clinical care plan here)  Our next appointment is by telephone on 08/23/22 at 10:30 AM   Please call the care guide team at [phone] if you need to cancel or reschedule your appointment.   If you are experiencing a Mental Health or Behavioral Health Crisis or need someone to talk to, please call 1-800-273-TALK (toll free, 24 hour hotline)   The patient verbalized understanding of instructions, educational materials, and care plan provided today and agreed to receive a mailed copy of patient instructions, educational materials, and care plan.   [name], RN, BSN, CCM Care Management Coordinator [facility] Management/[facility]  Direct Phone: [phone]

## 2022-04-25 NOTE — Chronic Care Management (AMB) (Signed)
Chronic Care Management   CCM RN Visit Note  04/25/2022 Name: Jesus Lam MRN: [id number] DOB: 04-08-1933  Subjective: Jesus Lam is a 88 y.o. year old male who is a primary care patient of Minette Brine, [place]. The care management team was consulted for assistance with disease management and care coordination needs.    Engaged with patient by telephone for follow up visit in response to provider referral for case management and/or care coordination services.   Consent to Services:  The patient was given information about Chronic Care Management services, agreed to services, and gave verbal consent prior to initiation of services.  Please see initial visit note for detailed documentation.   Patient agreed to services and verbal consent obtained.   Assessment: Review of patient past medical history, allergies, medications, health status, including review of consultants reports, laboratory and other test data, was performed as part of comprehensive evaluation and provision of chronic care management services.   SDOH (Social Determinants of Health) assessments and interventions performed:  Yes, no acute needs   CCM Care Plan  Allergies  Allergen Reactions   Shellfish Allergy Other (See Comments)    Gout     Outpatient Encounter Medications as of 04/25/2022  Medication Sig   amLODipine (NORVASC) 5 MG tablet TAKE ONE TABLET BY MOUTH DAILY   atorvastatin (LIPITOR) 40 MG tablet TAKE TWO TABLETS BY MOUTH DAILY   azelastine (ASTELIN) 0.1 % nasal spray Place 2 sprays into both nostrils 2 (two) times daily. Use in each nostril as directed   bisoprolol (ZEBETA) 5 MG tablet TAKE HALF TABLET BY MOUTH DAILY   chlorthalidone (HYGROTON) 25 MG tablet Take 1 tablet (25 mg total) by mouth daily.   Cholecalciferol (VITAMIN D3) 5000 units CAPS Take 5,000 Units by mouth daily.   clopidogrel (PLAVIX) 75 MG tablet TAKE ONE TABLET BY MOUTH DAILY   diclofenac Sodium (VOLTAREN) 1 % GEL Apply 2 g  topically 4 (four) times daily.   donepezil (ARICEPT) 10 MG tablet TAKE 1 TABLET AT BEDTIME   Elastic Bandages & Supports (MEDICAL COMPRESSION STOCKINGS) MISC    furosemide (LASIX) 20 MG tablet TAKE ONE TABLET BY MOUTH DAILY   gabapentin (NEURONTIN) 300 MG capsule TAKE ONE CAPSULE BY MOUTH AT BEDTIME   hydrALAZINE (APRESOLINE) 50 MG tablet Take 1 tablet (50 mg total) by mouth 3 (three) times daily.   icosapent Ethyl (VASCEPA) 1 g capsule TAKE ONE CAPSULE BY MOUTH TWICE A DAY   isosorbide mononitrate (IMDUR) 30 MG 24 hr tablet TAKE ONE TABLET BY MOUTH TWICE A DAY   ketorolac (ACULAR) 0.5 % ophthalmic solution ketorolac 0.5 % eye drops   levocetirizine (XYZAL) 5 MG tablet TAKE ONE TABLET BY MOUTH EVERY EVENING   Menthol-Methyl Salicylate (MUSCLE RUB) 10-15 % CREA muscle rub   methimazole (TAPAZOLE) 10 MG tablet TAKE ONE TABLET BY MOUTH DAILY   Multiple Vitamins-Minerals (COMPLETE SENIOR PO) 1 tablet daily.    MYRBETRIQ 25 MG TB24 tablet TAKE ONE TABLET BY MOUTH DAILY   tamsulosin (FLOMAX) 0.4 MG CAPS capsule TAKE ONE CAPSULE BY MOUTH DAILY HALF HOUR FOLLOWING THE SAME MEAL DAILY   vitamin B-12 (CYANOCOBALAMIN) 500 MCG tablet Take 500 mcg by mouth daily.   vitamin C (ASCORBIC ACID) 500 MG tablet Take 500 mg daily by mouth.   Facility-Administered Encounter Medications as of 04/25/2022  Medication   0.9 %  sodium chloride infusion    Patient Active Problem List   Diagnosis Date Noted   Leg edema 08/01/2021  Mild cognitive impairment 09/07/2020   Chronic pain of left knee 03/15/2020   Pure hypercholesterolemia 11/15/2019   First degree AV block 07/28/2019   Hypertriglyceridemia 04/29/2019   Mixed hyperlipidemia 04/29/2019   CVA (cerebral vascular accident) ([place])    Bradycardia    Subclinical hyperthyroidism 11/18/2018   Peripheral artery disease ([place]) 06/13/2018   Ischemic cerebrovascular accident (CVA) ([place]) 06/13/2018   Dysarthria 06/12/2018   Renal insufficiency 06/12/2018    Claudication ([place]) 01/11/2018   Anemia 10/10/2017   Essential hypertension 11/04/2013    Conditions to be addressed/monitored: HTN, Hyperthyroidism, Altered memory,  Bilateral hand pain, Medication management, Pure hypercholesterolemia, Chronic pain of left knee   Care Plan : RN Care Manager Plan of Care  Updates made by Lynne Logan, RN since 04/25/2022 12:00 AM     Problem: No Plan of Care established for management of chronic disease states (HTN, Hyperthyroidism, Altered memory,  Bilateral hand pain, Medication management, Pure hypercholesterolemia, Chronic pain of left knee)   Priority: High     Long-Range Goal: Establishment of plan of care for management of chronic disease states (HTN, Hyperthyroidism, Altered memory,  Bilateral hand pain, Medication management, Pure hypercholesterolemia, Chronic pain of left knee)   Start Date: 12/13/2021  Expected End Date: 12/13/2022  Recent Progress: On track  Priority: High  Note:   Current Barriers:  Knowledge Deficits related to plan of care for management of HTN, Hyperthyroidism, Altered memory,  Bilateral hand pain, Medication management, Pure hypercholesterolemia, Chronic pain of left knee   Chronic Disease Management support and education needs related to HTN, Hyperthyroidism, Altered memory,  Bilateral hand pain, Medication management, Pure hypercholesterolemia, Chronic pain of left knee    RNCM Clinical Goal(s):  Patient will verbalize basic understanding of  HTN, Hyperthyroidism, Altered memory,  Bilateral hand pain, Medication management, Pure hypercholesterolemia, Chronic pain of left knee  disease process and self health management plan as evidenced by patient will report having no disease exacerbations related to his chronic disease states listed above  take all medications exactly as prescribed and will call provider for medication related questions as evidenced by patient will demonstrate improved understanding of prescribed  medications and rationale for usage as evidenced by patient teach back demonstrate Improved health management independence as evidenced by patient will maintain current mobility and fuctionality continue to work with RN Care Manager to address care management and care coordination needs related to  HTN, Hyperthyroidism, Altered memory,  Bilateral hand pain, Medication management, Pure hypercholesterolemia, Chronic pain of left knee  as evidenced by adherence to CM Team Scheduled appointments through collaboration with RN Care manager, provider, and care team.   Interventions: 1:1 collaboration with primary care provider regarding development and update of comprehensive plan of care as evidenced by provider attestation and co-signature Inter-disciplinary care team collaboration (see longitudinal plan of care) Evaluation of current treatment plan related to  self management and patient's adherence to plan as established by provider   Chronic Kidney Disease Interventions:  (Status:  Condition stable.  Not addressed this visit.) Long Term Goal Assessed the Patient understanding of chronic kidney disease    Evaluation of current treatment plan related to chronic kidney disease self management and patient's adherence to plan as established by provider      Reviewed prescribed diet increase daily water intake to 64 oz  Discussed the impact of chronic kidney disease on daily life and mental health and acknowledged and normalized feelings of disempowerment, fear, and frustration    Provided education on  kidney disease progression    Determined patient is experiencing urinary frequency with nocturia, denies other symptoms but states this is a new problem that started about 1 month ago Educated patient on s/s suggestive UTI, educated on other potential causes and complications if left undiagnosed/treated  Determined patient feels he is staying well hydrated as evidence by patient is drinking 4-16.9 oz of  bottled water daily  Encouraged patient to schedule a PCP visit for further evaluation/treatment, placed patient on PCP schedule for 12/17/21 $RemoveBef'@[id number]$ :00 PM, daughter will provide transportation  Discussed plans with patient for ongoing care management follow up and provided patient with direct contact information for care management team   Hypertension Interventions:  (Status:  Condition stable.  Not addressed this visit.) Long Term Goal Last practice recorded BP readings:  BP Readings from Last 3 Encounters:  02/14/22 126/60  02/14/22 126/60  01/10/22 (!) 145/67  Most recent eGFR/CrCl:  Lab Results  Component Value Date   EGFR 54 (L) 12/24/2021    No components found for: CRCL Evaluation of current treatment plan related to hypertension self management and patient's adherence to plan as established by provider Reviewed medications with patient and discussed importance of compliance, discussed recent increase in Hydralazine per Dr. Virgina Jock for improved management of hypertension  Assessed social determinant of health barriers  Mailed printed educational materials related to Why Should I Restrict Sodium?; Chair Exercises  Rheumatoid Arthritis Interventions:  (Status:  Condition stable.  Not addressed this visit.)  Long Term Goal Evaluation of current treatment plan related to  Rheumatoid Arthritis , self-management and patient's adherence to plan as established by provider Provided education to patient about basic disease process related to RA Review of patient status, including review of consultant's reports, relevant laboratory and other test results, and medications completed. Reviewed medications with patient and discussed importance of medication adherence Discussed plans with patient for ongoing care management follow up and provided patient with direct contact information for care management team  Hyperlipidemia Interventions:  (Status:  Condition stable.  Not addressed this visit.)  Long Term Goal Evaluation of current treatment plan related to Hyperlipidemia, self-management and patient's adherence to plan as established by provider Provided education to patient about basic disease process related to Hyperlipidemia Review of patient status, including review of consultant's reports, relevant laboratory and other test results, and medications completed. Reviewed medications with patient and discussed importance of medication adherence Discussed plans with patient for ongoing care management follow up and provided patient with direct contact information for care management team Lipid Panel     Component Value Date/Time   CHOL 124 02/08/2021 1529   TRIG 203 (H) 02/08/2021 1529   HDL 32 (L) 02/08/2021 1529   CHOLHDL 3.9 02/08/2021 1529   CHOLHDL 4.4 06/13/2018 1018   VLDL 18 06/13/2018 1018   LDLCALC 59 02/08/2021 1529   LABVLDL 33 02/08/2021 1529   Dementia Interventions:  (Status:  Condition stable.  Not addressed this visit.)  Long Term Goal Evaluation of current treatment plan related to memory loss, self-management and patient's adherence to plan as established by provider Provided education to patient about basic disease process related to Dementia  Review of patient status, including review of consultant's reports, relevant laboratory and other test results, and medications completed. Reviewed medications with patient and discussed importance of medication adherence Discussed plans with patient for ongoing care management follow up and provided patient with direct contact information for care management team  Elevated PSA:  (Status:  Goal on track:  NO.)  Long Term Goal Completed successful outbound call with patient and daughter [name] Evaluation of current treatment plan related to  Elevated PSA , self-management and patient's adherence to plan as established by provider Determined patient experienced dysuria and frequency several weeks prior to going to Urgent  Care Review of patient status, including review of consultant's reports, relevant laboratory and other test results, and medications completed Dicussed patient feels his symptoms have improved Educated on basic disease process related to elevated PSA Determined patient did not receive a call from [facility], he requested this information be provided to his daughter so she can help him call for an appointment Educated patient on importance to promptly report reoccurring urinary symptoms in order to receive early diagnosis/treatment  Placed successful outbound call to daughter [name], advised of patient's recent urgent care visit with symptoms of dysuria and frequency Discussed referral for Urology was sent by PCP in late March for further evaluation of elevated PSA, provided further rationale for the need for evaluation and treatment of symptoms  Sent secure email to daughter [name] with the contact number for [facility], mailed letter to patient with this information  Discussed plans with patient for ongoing care management follow up and provided patient with direct contact information for care management team Prostate Specific Ag, Serum 0.0 - 4.0 ng/mL 8.8 High   8.5 High  CM   Subclinical hyperthyroidism:  (Status:  Condition stable.  Not addressed this visit.)  Long Term Goal Completed successful outbound call with patient and daughter [name]  Evaluation of current treatment plan related to  Subclinical hyperthyroidism , self-management and patient's adherence to plan as established by provider. Review of patient status, including review of consultant's reports, relevant laboratory and other test results, and medications completed. Reviewed medications with patient and discussed importance of medication adherence, determined patient is not currently taking a prescription medication prescribed for thyroid disease Determined PCP referral to Endocrinology has been authorized with Dr. Kelton Pillar  and is ready to schedule Provided daughter with the name/contact number for Dr. Kelton Pillar, instructed daughter to contact this provider in order to schedule a new patient appointment for evaluation/treatment of abnormal TSH, daughter verbalizes understanding Discussed plans with patient for ongoing care management follow up and provided patient with direct contact information for care management team TSH 0.450 - 4.500 uIU/mL 0.056 Low        Patient Goals/Self-Care Activities: Take all medications as prescribed Attend all scheduled provider appointments Call pharmacy for medication refills 3-7 days in advance of running out of medications Perform all self care activities independently  Call provider office for new concerns or questions   Follow Up Plan:  Telephone follow up appointment with care management team member scheduled for:  08/23/22      Barb Merino, RN, BSN, CCM Care Management Coordinator [facility]  Direct Phone: [phone]

## 2022-05-01 DIAGNOSIS — I10 Essential (primary) hypertension: Secondary | ICD-10-CM | POA: Diagnosis not present

## 2022-05-20 ENCOUNTER — Telehealth: Payer: Self-pay

## 2022-05-20 NOTE — Chronic Care Management (AMB) (Signed)
Chronic Care Management Pharmacy Assistant   Name: Jesus Lam  MRN: [id number] DOB: 1933/08/22  Reason for Encounter: Disease State/ Hypertension  Recent office visits:  04-25-2022 [name], RN (CCM)  03-26-2022 [place], [place]. Tdap injection given.  02-19-2022 [name], RN (CCM)  Recent consult visits:  04-16-2022 Elder Negus, MD (Cardiology). EKG completed.  03-31-2022 Elder Negus, MD (Cardiology). EKG completed.  03-01-2022 Augustin Schooling, MD (Ophthalmology). Unable to view encounter.  03-01-2022 Yates Decamp, MD (Cardiology). Unable to view encounter.  02-21-2022 Herschel Senegal, MD (Optometry). Unable to view encounter  Hospital visits:  Medication Reconciliation was completed by comparing discharge summary, patient's EMR and Pharmacy list, and upon discussion with patient.  Admitted to the hospital on 03-26-2022 due to Dysuria. Discharge date was 03-25-2022 [facility].    New?Medications Started at [facility] Discharge:?? Bactrim 800-160 mg twice daily for 10 days  Medication Changes at Hospital Discharge: None  Medications Discontinued at Hospital Discharge: None  Medications that remain the same after Hospital Discharge:??  -All other medications will remain the same.    Medications: Outpatient Encounter Medications as of 05/20/2022  Medication Sig   amLODipine (NORVASC) 5 MG tablet TAKE ONE TABLET BY MOUTH DAILY   atorvastatin (LIPITOR) 40 MG tablet TAKE TWO TABLETS BY MOUTH DAILY   azelastine (ASTELIN) 0.1 % nasal spray Place 2 sprays into both nostrils 2 (two) times daily. Use in each nostril as directed   bisoprolol (ZEBETA) 5 MG tablet TAKE HALF TABLET BY MOUTH DAILY   chlorthalidone (HYGROTON) 25 MG tablet Take 1 tablet (25 mg total) by mouth daily.   Cholecalciferol (VITAMIN D3) 5000 units CAPS Take 5,000 Units by mouth daily.   clopidogrel (PLAVIX) 75 MG tablet TAKE ONE TABLET BY MOUTH  DAILY   diclofenac Sodium (VOLTAREN) 1 % GEL Apply 2 g topically 4 (four) times daily.   donepezil (ARICEPT) 10 MG tablet TAKE 1 TABLET AT BEDTIME   Elastic Bandages & Supports (MEDICAL COMPRESSION STOCKINGS) MISC    furosemide (LASIX) 20 MG tablet TAKE ONE TABLET BY MOUTH DAILY   gabapentin (NEURONTIN) 300 MG capsule TAKE ONE CAPSULE BY MOUTH AT BEDTIME   hydrALAZINE (APRESOLINE) 50 MG tablet Take 1 tablet (50 mg total) by mouth 3 (three) times daily.   icosapent Ethyl (VASCEPA) 1 g capsule TAKE ONE CAPSULE BY MOUTH TWICE A DAY   isosorbide mononitrate (IMDUR) 30 MG 24 hr tablet TAKE ONE TABLET BY MOUTH TWICE A DAY   ketorolac (ACULAR) 0.5 % ophthalmic solution ketorolac 0.5 % eye drops   levocetirizine (XYZAL) 5 MG tablet TAKE ONE TABLET BY MOUTH EVERY EVENING   Menthol-Methyl Salicylate (MUSCLE RUB) 10-15 % CREA muscle rub   methimazole (TAPAZOLE) 10 MG tablet TAKE ONE TABLET BY MOUTH DAILY   Multiple Vitamins-Minerals (COMPLETE SENIOR PO) 1 tablet daily.    MYRBETRIQ 25 MG TB24 tablet TAKE ONE TABLET BY MOUTH DAILY   tamsulosin (FLOMAX) 0.4 MG CAPS capsule TAKE ONE CAPSULE BY MOUTH DAILY HALF HOUR FOLLOWING THE SAME MEAL DAILY   vitamin B-12 (CYANOCOBALAMIN) 500 MCG tablet Take 500 mcg by mouth daily.   vitamin C (ASCORBIC ACID) 500 MG tablet Take 500 mg daily by mouth.   Facility-Administered Encounter Medications as of 05/20/2022  Medication   0.9 %  sodium chloride infusion  Reviewed chart prior to disease state call. Spoke with patient regarding BP  Recent Office Vitals: BP Readings from Last 3 Encounters:  04/16/22 120/60  03/26/22  140/64  03/26/22 118/70   Pulse Readings from Last 3 Encounters:  04/16/22 (!) 49  03/26/22 66  03/26/22 60    Wt Readings from Last 3 Encounters:  04/16/22 231 lb (104.8 kg)  03/26/22 230 lb 6.4 oz (104.5 kg)  02/14/22 234 lb (106.1 kg)     Kidney Function Lab Results  Component Value Date/Time   CREATININE 1.28 (H) 12/24/2021 04:41 PM    CREATININE 1.19 08/23/2021 10:16 AM   GFRNONAA 55 (L) 08/03/2020 02:32 PM   GFRAA 64 08/03/2020 02:32 PM       Latest Ref Rng & Units 12/24/2021    4:41 PM 08/23/2021   10:16 AM 02/08/2021    3:29 PM  BMP  Glucose 70 - 99 mg/dL 81  93  90   BUN 8 - 27 mg/dL 21  14  15    Creatinine 0.76 - 1.27 mg/dL  5.39  7.67   BUN/Creat Ratio 10 - 24 16  12  11    Sodium 134 - 144 mmol/L 145  145  146   Potassium 3.5 - 5.2 mmol/L 4.8  4.7  5.1   Chloride 96 - 106 mmol/L 108  109  108   CO2 20 - 29 mmol/L 24  22  23    Calcium 8.6 - 10.2 mg/dL 9.2  9.4  9.5     Current antihypertensive regimen:  Amlodipine 5 mg daily Bisoprolol 5 mg daily Chlorthalidone 25 mg daily Isosorbide 20 mg twice daily Hydralazine 50 mg daily  How often are you checking your Blood Pressure? daily  Current home BP readings: 120/67 70, 122/64 67  What recent interventions/DTPs have been made by any provider to improve Blood Pressure control since last CPP Visit:  -Educated on BP goals and benefits of medications for prevention of heart attack, stroke and kidney damage; Daily salt intake goal < 2300 mg; Exercise goal of 150 minutes per week; -Counseled to monitor BP at home at least once per day, document, and provide log at future appointments  Any recent hospitalizations or ED visits since last visit with CPP? Yes  What diet changes have been made to improve Blood Pressure Control?  Patient stated he limits salt intake and drinks plenty of water daily.  What exercise is being done to improve your Blood Pressure Control?  Patient states he [facility] the senior center and the [facility] some days.  Adherence Review: Is the patient currently on ACE/ARB medication? No Does the patient have >5 day gap between last estimated fill dates? No   Care Gaps: 2nd shingrix overdue AWV 03-05-2023  Star Rating Drugs: Atorvastatin 40 mg- Last filled 04-17-2022 30 DS [facility]  [facility] Clinical  Pharmacist Assistant [phone]

## 2022-05-22 ENCOUNTER — Other Ambulatory Visit: Payer: Self-pay | Admitting: Nurse Practitioner

## 2022-05-22 DIAGNOSIS — G3184 Mild cognitive impairment, so stated: Secondary | ICD-10-CM

## 2022-05-24 DIAGNOSIS — E785 Hyperlipidemia, unspecified: Secondary | ICD-10-CM

## 2022-05-24 DIAGNOSIS — N189 Chronic kidney disease, unspecified: Secondary | ICD-10-CM | POA: Diagnosis not present

## 2022-05-24 DIAGNOSIS — M069 Rheumatoid arthritis, unspecified: Secondary | ICD-10-CM

## 2022-05-24 DIAGNOSIS — I129 Hypertensive chronic kidney disease with stage 1 through stage 4 chronic kidney disease, or unspecified chronic kidney disease: Secondary | ICD-10-CM | POA: Diagnosis not present

## 2022-05-24 DIAGNOSIS — E0590 Thyrotoxicosis, unspecified without thyrotoxic crisis or storm: Secondary | ICD-10-CM

## 2022-05-31 DIAGNOSIS — I10 Essential (primary) hypertension: Secondary | ICD-10-CM | POA: Diagnosis not present

## 2022-07-01 ENCOUNTER — Other Ambulatory Visit: Payer: Self-pay | Admitting: Nurse Practitioner

## 2022-07-01 ENCOUNTER — Other Ambulatory Visit: Payer: Self-pay | Admitting: Cardiology

## 2022-07-01 DIAGNOSIS — R350 Frequency of micturition: Secondary | ICD-10-CM

## 2022-07-01 DIAGNOSIS — R351 Nocturia: Secondary | ICD-10-CM

## 2022-07-01 DIAGNOSIS — I10 Essential (primary) hypertension: Secondary | ICD-10-CM | POA: Diagnosis not present

## 2022-07-01 DIAGNOSIS — G8929 Other chronic pain: Secondary | ICD-10-CM

## 2022-07-01 MED ORDER — MIRABEGRON ER 25 MG PO TB24: 25.0000 mg | ORAL_TABLET | Freq: Every day | ORAL | 1 refills | Status: DC

## 2022-07-23 NOTE — Progress Notes (Signed)
This encounter was created in error - please disregard.

## 2022-07-27 ENCOUNTER — Emergency Department (HOSPITAL_COMMUNITY)
Admission: EM | Admit: 2022-07-27 | Discharge: 2022-07-28 | Disposition: A | Discharge status: Home / Self Care | Payer: Medicare HMO | Attending: Emergency Medicine | Admitting: Emergency Medicine

## 2022-07-27 ENCOUNTER — Other Ambulatory Visit: Payer: Self-pay

## 2022-07-27 ENCOUNTER — Encounter (HOSPITAL_COMMUNITY): Payer: Self-pay | Admitting: Emergency Medicine

## 2022-07-27 DIAGNOSIS — Z7901 Long term (current) use of anticoagulants: Secondary | ICD-10-CM | POA: Insufficient documentation

## 2022-07-27 DIAGNOSIS — Z79899 Other long term (current) drug therapy: Secondary | ICD-10-CM | POA: Diagnosis not present

## 2022-07-27 DIAGNOSIS — I10 Essential (primary) hypertension: Secondary | ICD-10-CM | POA: Insufficient documentation

## 2022-07-27 DIAGNOSIS — R55 Syncope and collapse: Secondary | ICD-10-CM | POA: Diagnosis not present

## 2022-07-27 NOTE — ED Triage Notes (Signed)
Pt BIB [facility] for a syncopal episode while sitting at the table playing cards; pt reports he is unsure if he had LOC; upon arrival pt was diaphoretic, v/s BP 112/68, HR 42, CBG 78, O2 100%; pt reports feeling dizzy after standing; pt has hx HTN, Afib, stroke, MI, bilateral leg edema, pt a&o

## 2022-07-28 ENCOUNTER — Emergency Department (HOSPITAL_COMMUNITY): Payer: Medicare HMO

## 2022-07-28 DIAGNOSIS — R55 Syncope and collapse: Secondary | ICD-10-CM | POA: Diagnosis not present

## 2022-07-28 LAB — COMPREHENSIVE METABOLIC PANEL
ALT: 18 U/L (ref 0–44)
AST: 26 U/L (ref 15–41)
Albumin: 3.3 g/dL — ABNORMAL LOW (ref 3.5–5.0)
Alkaline Phosphatase: 60 U/L (ref 38–126)
Anion gap: 7 (ref 5–15)
BUN: 16 mg/dL (ref 8–23)
CO2: 25 mmol/L (ref 22–32)
Calcium: 9.3 mg/dL (ref 8.9–10.3)
Chloride: 111 mmol/L (ref 98–111)
Creatinine, Ser: 1.34 mg/dL — ABNORMAL HIGH (ref 0.61–1.24)
GFR, Estimated: 51 mL/min — ABNORMAL LOW (ref >60)
Glucose, Bld: 119 mg/dL — ABNORMAL HIGH (ref 70–99)
Potassium: 4.1 mmol/L (ref 3.5–5.1)
Sodium: 143 mmol/L (ref 135–145)
Total Bilirubin: 0.6 mg/dL (ref 0.3–1.2)
Total Protein: 7.4 g/dL (ref 6.5–8.1)

## 2022-07-28 LAB — URINALYSIS, ROUTINE W REFLEX MICROSCOPIC
Bacteria, UA: NONE SEEN
Bilirubin Urine: NEGATIVE
Glucose, UA: NEGATIVE mg/dL
Hgb urine dipstick: NEGATIVE
Ketones, ur: NEGATIVE mg/dL
Nitrite: NEGATIVE
Protein, ur: NEGATIVE mg/dL
Specific Gravity, Urine: 1.014 (ref 1.005–1.030)
pH: 5 (ref 5.0–8.0)

## 2022-07-28 LAB — RAPID URINE DRUG SCREEN, HOSP PERFORMED
Amphetamines: NOT DETECTED
Barbiturates: NOT DETECTED
Benzodiazepines: NOT DETECTED
Cocaine: NOT DETECTED
Opiates: NOT DETECTED
Tetrahydrocannabinol: POSITIVE — AB

## 2022-07-28 LAB — CBC WITH DIFFERENTIAL/PLATELET
Abs Immature Granulocytes: 0.03 10*3/uL (ref 0.00–0.07)
Basophils Absolute: 0 10*3/uL (ref 0.0–0.1)
Basophils Relative: 1 %
Eosinophils Absolute: 0.1 10*3/uL (ref 0.0–0.5)
Eosinophils Relative: 1 %
HCT: 36.6 % — ABNORMAL LOW (ref 39.0–52.0)
Hemoglobin: 11.4 g/dL — ABNORMAL LOW (ref 13.0–17.0)
Immature Granulocytes: 1 %
Lymphocytes Relative: 19 %
Lymphs Abs: 1.2 10*3/uL (ref 0.7–4.0)
MCH: 30.2 pg (ref 26.0–34.0)
MCHC: 31.1 g/dL (ref 30.0–36.0)
MCV: 96.8 fL (ref 80.0–100.0)
Monocytes Absolute: 0.4 10*3/uL (ref 0.1–1.0)
Monocytes Relative: 7 %
Neutro Abs: 4.5 10*3/uL (ref 1.7–7.7)
Neutrophils Relative %: 71 %
Platelets: 171 10*3/uL (ref 150–400)
RBC: 3.78 MIL/uL — ABNORMAL LOW (ref 4.22–5.81)
RDW: 13.6 % (ref 11.5–15.5)
WBC: 6.3 10*3/uL (ref 4.0–10.5)
nRBC: 0 % (ref 0.0–0.2)

## 2022-07-28 LAB — ETHANOL: Alcohol, Ethyl (B): <10 mg/dL (ref <10)

## 2022-07-28 LAB — TROPONIN I (HIGH SENSITIVITY)
Troponin I (High Sensitivity): 25 ng/L — ABNORMAL HIGH (ref <18)
Troponin I (High Sensitivity): 25 ng/L — ABNORMAL HIGH (ref <18)

## 2022-07-28 NOTE — ED Notes (Signed)
Pt unable to void at this time, pt aware sample is needed, urinal placed at bedside

## 2022-07-28 NOTE — ED Provider Notes (Signed)
[facility] EMERGENCY DEPARTMENT Provider Note   CSN: [id number] Arrival date & time: 07/27/22  2345     History  Chief Complaint  Patient presents with   Loss of Consciousness    Jesus Lam is a 88 y.o. male.  The history is provided by the patient and medical records.  Loss of Consciousness  88 year old male with history of hypertension, bradycardia, prior stroke, hyperlipidemia, peripheral arterial disease, presenting to the ED after syncopal episode.  States he was at home with family sitting at the table playing cards when all of a sudden he got very lightheaded and his vision seem to go black.  He was assisted up from the table to the sofa and proceeded to have brief loss of consciousness.  He did not fall to the ground or experience any trauma.  Currently, states he feels fine.  He does report eating less today and did not really drink any water today at all.  He does admit to some alcohol use tonight and "smoked refer".  He denies any recent illness, fever, or chills.  No history of syncope in the past.  Denies chest pain or SOB.  Home Medications Prior to Admission medications   Medication Sig Start Date End Date Taking? Authorizing Provider  donepezil (ARICEPT) 10 MG tablet TAKE 1 TABLET AT BEDTIME 05/22/22   [name], FNP  amLODipine (NORVASC) 5 MG tablet TAKE ONE TABLET BY MOUTH DAILY 12/05/21   Patwardhan, Manish J, MD  atorvastatin (LIPITOR) 40 MG tablet TAKE TWO TABLETS BY MOUTH DAILY 11/12/21   Patwardhan, Manish J, MD  azelastine (ASTELIN) 0.1 % nasal spray Place 2 sprays into both nostrils 2 (two) times daily. Use in each nostril as directed 08/15/21   [name], FNP  bisoprolol (ZEBETA) 5 MG tablet TAKE HALF TABLET BY MOUTH DAILY 07/01/22   [name], FNP  chlorthalidone (HYGROTON) 25 MG tablet Take 1 tablet (25 mg total) by mouth daily. 02/07/20   Dorothyann Peng, MD  Cholecalciferol (VITAMIN D3) 5000 units CAPS Take 5,000 Units by mouth  daily. 01/09/18   [provider]  clopidogrel (PLAVIX) 75 MG tablet TAKE ONE TABLET BY MOUTH DAILY 03/25/22   Patwardhan, Manish J, MD  diclofenac Sodium (VOLTAREN) 1 % GEL APPLY TWO GRAMS TOPICALLY FOUR TIMES A DAY 07/01/22   [name], FNP  Elastic Bandages & Supports (MEDICAL COMPRESSION STOCKINGS) MISC  06/06/18   [provider]  furosemide (LASIX) 20 MG tablet TAKE ONE TABLET BY MOUTH DAILY 09/12/21   [name], FNP  gabapentin (NEURONTIN) 300 MG capsule TAKE ONE CAPSULE BY MOUTH AT BEDTIME 01/11/21   [name], FNP  hydrALAZINE (APRESOLINE) 50 MG tablet Take 1 tablet (50 mg total) by mouth 3 (three) times daily. 01/10/22   Patwardhan, Anabel Bene, MD  icosapent Ethyl (VASCEPA) 1 g capsule TAKE ONE CAPSULE BY MOUTH TWICE A DAY 02/08/21   Patwardhan, Manish J, MD  isosorbide mononitrate (IMDUR) 30 MG 24 hr tablet TAKE ONE TABLET BY MOUTH TWICE A DAY 12/24/21   Patwardhan, Manish J, MD  ketorolac (ACULAR) 0.5 % ophthalmic solution ketorolac 0.5 % eye drops    [provider]  levocetirizine (XYZAL) 5 MG tablet TAKE ONE TABLET BY MOUTH EVERY EVENING 10/22/21   [name], FNP  Menthol-Methyl Salicylate (MUSCLE RUB) 10-15 % CREA muscle rub    [provider]  methimazole (TAPAZOLE) 10 MG tablet TAKE ONE TABLET BY MOUTH DAILY 02/08/21   [name], FNP  mirabegron ER (  MYRBETRIQ) 25 MG TB24 tablet Take 1 tablet (25 mg total) by mouth daily. 07/01/22   [name], FNP  Multiple Vitamins-Minerals (COMPLETE SENIOR PO) 1 tablet daily.  06/06/18   [provider]  MYRBETRIQ 25 MG TB24 tablet TAKE ONE TABLET BY MOUTH DAILY 07/01/22   [name], FNP  tamsulosin (FLOMAX) 0.4 MG CAPS capsule TAKE ONE CAPSULE BY MOUTH DAILY HALF HOUR FOLLOWING THE SAME MEAL DAILY 11/13/21   [name], FNP  vitamin B-12 (CYANOCOBALAMIN) 500 MCG tablet Take 500 mcg by mouth daily.    [provider]  vitamin C (ASCORBIC ACID) 500 MG tablet Take 500 mg daily by  mouth.    [provider]      Allergies    Shellfish allergy    Review of Systems   Review of Systems  Cardiovascular:  Positive for syncope.  Neurological:  Positive for syncope.  All other systems reviewed and are negative.   Physical Exam Updated Vital Signs BP 137/81   Pulse (!) 53   Temp 97.9 F (36.6 C) (Oral)   Resp 15   Ht 6' (1.829 m)   Wt 110.2 kg   SpO2 97%   BMI 32.96 kg/m  Physical Exam Vitals and nursing note reviewed.  Constitutional:      Appearance: He is well-developed.  HENT:     Head: Normocephalic and atraumatic.  Eyes:     Conjunctiva/sclera: Conjunctivae normal.     Pupils: Pupils are equal, round, and reactive to light.  Cardiovascular:     Rate and Rhythm: Normal rate and regular rhythm.     Heart sounds: Normal heart sounds.  Pulmonary:     Effort: Pulmonary effort is normal. No respiratory distress.     Breath sounds: Normal breath sounds. No rhonchi.  Abdominal:     General: Bowel sounds are normal.     Palpations: Abdomen is soft.     Tenderness: There is no abdominal tenderness. There is no rebound.  Musculoskeletal:        General: Normal range of motion.     Cervical back: Normal range of motion.  Skin:    General: Skin is warm and dry.  Neurological:     Mental Status: He is alert and oriented to person, place, and time.     Comments: AAOx3, answering questions and following commands appropriately; equal strength UE and LE bilaterally; CN grossly intact; moves all extremities appropriately without ataxia; no focal neuro deficits or facial asymmetry appreciated     ED Results / Procedures / Treatments   Labs (all labs ordered are listed, but only abnormal results are displayed) Labs Reviewed  CBC WITH DIFFERENTIAL/PLATELET - Abnormal; Notable for the following components:      Result Value   RBC 3.78 (*)    Hemoglobin 11.4 (*)    HCT 36.6 (*)    All other components within normal limits  URINALYSIS, ROUTINE W  REFLEX MICROSCOPIC - Abnormal; Notable for the following components:   APPearance HAZY (*)    Leukocytes,Ua TRACE (*)    All other components within normal limits  RAPID URINE DRUG SCREEN, HOSP PERFORMED - Abnormal; Notable for the following components:   Tetrahydrocannabinol POSITIVE (*)    All other components within normal limits  COMPREHENSIVE METABOLIC PANEL - Abnormal; Notable for the following components:   Glucose, Bld 119 (*)    Creatinine, Ser 1.34 (*)    Albumin 3.3 (*)    GFR, Estimated 51 (*)  All other components within normal limits  TROPONIN I (HIGH SENSITIVITY) - Abnormal; Notable for the following components:   Troponin I (High Sensitivity) 25 (*)    All other components within normal limits  TROPONIN I (HIGH SENSITIVITY) - Abnormal; Notable for the following components:   Troponin I (High Sensitivity) 25 (*)    All other components within normal limits  ETHANOL    EKG EKG Interpretation  Date/Time:  Saturday July 27 2022 23:52:59 EDT Ventricular Rate:  54 PR Interval:  305 QRS Duration: 140 QT Interval:  485 QTC Calculation: 460 R Axis:   -65 Text Interpretation: Sinus rhythm Prolonged PR interval Left bundle branch block no wpw prolonged qt or brugada Confirmed by Melene Plan [phone]) on 07/27/2022 11:55:10 PM  Radiology DG Chest 2 View  Result Date: 07/28/2022 CLINICAL DATA:  Syncope. EXAM: CHEST - 2 VIEW COMPARISON:  Chest radiograph dated 06/12/2018. FINDINGS: No focal consolidation, pleural effusion or pneumothorax. The cardiac silhouette is within limits. No acute osseous pathology. IMPRESSION: No active cardiopulmonary disease. Electronically Signed   By: Elgie Collard M.D.   On: 07/28/2022 00:40    Procedures Procedures    Medications Ordered in ED Medications - No data to display  ED Course/ Medical Decision Making/ A&P                           Medical Decision Making Amount and/or Complexity of Data Reviewed Labs:  ordered. Radiology: ordered and independent interpretation performed. ECG/medicine tests: ordered and independent interpretation performed.   88 year old male presenting to the ED after syncopal event.  States he was sitting at table playing cards with family when vision started to "go black".  He was assisted to the couch and had brief loss of consciousness.  There was no fall to the ground or trauma.  On arrival to ED he is awake, alert, fully oriented.  States he feels fine currently.  He denies any history of syncope in the past.  Does admit to poor oral intake today, did not drink any water, and smoked some "reefer".  Will check EKG, chest x-ray, labs.  Orthostatics are within normal limits.  EKG sinus rhythm, no acute ischemia.  Labs as above-- no leukocytosis or profound anemia.  Serum creatinine 1.3 which appears around his baseline.  Troponin is 25.  Denies any chest pain today or earlier today.  CXR is clear.    Delta troponin remains flat at 25.  Continues to deny chest pain.  May be secondary to his CKD.  He remains AAOx3 here, tolerating PO.  Suspect syncope may be related to his poor oral intake.  Feel he is stable for discharge.  Encouraged to eat/drink regularly.  Follow-up with PCP.  Return here for new concerns.  Shared visit with attending physician, Dr. Adela Lank, who evaluated patient and agrees with plan of care.  Final Clinical Impression(s) / ED Diagnoses Final diagnoses:  Syncope, unspecified syncope type    Rx / DC Orders ED Discharge Orders     None         Garlon Hatchet, PA-C 07/28/22 0407    Melene Plan, DO 07/28/22 0410

## 2022-07-28 NOTE — Discharge Instructions (Signed)
Make sure to eat/drink regularly. Please follow-up with your primary care doctor. Return here for new concerns.

## 2022-07-31 ENCOUNTER — Telehealth: Payer: Self-pay

## 2022-07-31 NOTE — Telephone Encounter (Signed)
Transition Care Management Unsuccessful Follow-up Telephone Call  Date of discharge and from where:  07/28/2022 [place]   Attempts:  1st Attempt  Reason for unsuccessful TCM follow-up call:  Left voice message

## 2022-08-02 ENCOUNTER — Other Ambulatory Visit: Payer: Self-pay | Admitting: Cardiology

## 2022-08-20 ENCOUNTER — Ambulatory Visit (INDEPENDENT_AMBULATORY_CARE_PROVIDER_SITE_OTHER): Payer: Medicare HMO | Admitting: Internal Medicine

## 2022-08-20 ENCOUNTER — Encounter: Payer: Self-pay | Admitting: Internal Medicine

## 2022-08-20 VITALS — BP 146/80 | HR 84 | Ht 72.0 in | Wt 237.0 lb

## 2022-08-20 DIAGNOSIS — E0590 Thyrotoxicosis, unspecified without thyrotoxic crisis or storm: Secondary | ICD-10-CM

## 2022-08-20 DIAGNOSIS — E042 Nontoxic multinodular goiter: Secondary | ICD-10-CM

## 2022-08-20 LAB — T4, FREE: Free T4: 0.96 ng/dL (ref 0.60–1.60)

## 2022-08-20 LAB — TSH: TSH: 0.07 u[IU]/mL — ABNORMAL LOW (ref 0.35–5.50)

## 2022-08-20 NOTE — Progress Notes (Unsigned)
Name: Jesus Lam  MRN/ DOB: [id number], [birth year]    Age/ Sex: 88 y.o., male    PCP: [name], FNP   Reason for Endocrinology Evaluation: Subclinical hyperthyroidism     Date of Initial Endocrinology Evaluation: 08/20/2022     HPI: [name] is a 88 y.o. male with a past medical history of HTN, PVD, Hx of CVA. The patient presented for initial endocrinology clinic visit on 08/20/2022 for consultative assistance with his subclinical hyperthyroidism.   Patient has been noted with low TSH since December 2019 with a nadir of 0.018 u IU/mL, with normal T4 and T3.  Patient with normal TPO antibodies as well as undetectable TRAb  Thyroid ultrasound 01/2019 revealed multiple thyroid nodules  He is s/p benign FNA of the left mid 7.2 cm and right mid 2.5 cm nodules in May 2020  Methimazole was started in 10/2018  Pt not on Amiodarone   Weight continues to decrease  Denies palpitations  Has chronic hand tremors  Denies local neck swelling , denies dysphagia  Denies loose stools or diarrhea    Methimazole 10 mg daily      HISTORY:  Past Medical History:  Past Medical History:  Diagnosis Date   Anemia    BPH (benign prostatic hyperplasia)    Cataracts, bilateral    CVA (cerebral vascular accident) (HCC)    DVT (deep venous thrombosis) (HCC)    dvt in left leg   Dyslipidemia    Gout    Gout    HTN (hypertension)    Left hip pain 03/15/2020   Left leg pain    Osteoarthritis    PAD (peripheral artery disease) (HCC)    Stasis dermatitis    Stroke (HCC)    Vitamin D deficiency    Past Surgical History:  Past Surgical History:  Procedure Laterality Date   ABDOMINAL AORTOGRAM N/A 01/13/2018   Procedure: ABDOMINAL AORTOGRAM;  Surgeon: Elder Negus, MD;  Location: [facility];  Service: Cardiovascular;  Laterality: N/A;   CATARACT EXTRACTION, BILATERAL     LEFT HEART CATH AND CORONARY ANGIOGRAPHY N/A 01/13/2018   Procedure: LEFT HEART CATH  AND CORONARY ANGIOGRAPHY;  Surgeon: Elder Negus, MD;  Location: [facility];  Service: Cardiovascular;  Laterality: N/A;   LOWER EXTREMITY ANGIOGRAPHY N/A 01/13/2018   Procedure: LOWER EXTREMITY ANGIOGRAPHY;  Surgeon: Elder Negus, MD;  Location: [facility];  Service: Cardiovascular;  Laterality: N/A;   LOWER EXTREMITY ANGIOGRAPHY Right 01/27/2018   Procedure: LOWER EXTREMITY ANGIOGRAPHY;  Surgeon: Yates Decamp, MD;  Location: [facility];  Service: Cardiovascular;  Laterality: Right;   PERIPHERAL VASCULAR ATHERECTOMY Left 01/13/2018   Procedure: PERIPHERAL VASCULAR ATHERECTOMY;  Surgeon: Elder Negus, MD;  Location: [facility];  Service: Cardiovascular;  Laterality: Left;  SFA WITH PTA DRUG COATED BALLOON   PERIPHERAL VASCULAR INTERVENTION  01/27/2018   Procedure: PERIPHERAL VASCULAR INTERVENTION;  Surgeon: Yates Decamp, MD;  Location: [facility];  Service: Cardiovascular;;    Social History:  reports that he quit smoking about 43 years ago. His smoking use included cigarettes. He has a 0.13 pack-year smoking history. He has never used smokeless tobacco. He reports current alcohol use of about 6.0 - 7.0 standard drinks of alcohol per week. He reports current drug use. Drug: Marijuana. Family History: family history includes Alcohol abuse in his father; Breast cancer in his daughter; CVA in his daughter; Cancer in his father and mother;  Stroke in his daughter.   HOME MEDICATIONS: Allergies as of 08/20/2022       Reactions   Shellfish Allergy Other (See Comments)   Gout         Medication List        Accurate as of August 20, 2022  1:41 PM. If you have any questions, ask your nurse or doctor.          amLODipine 5 MG tablet Commonly known as: NORVASC TAKE ONE TABLET BY MOUTH DAILY   ascorbic acid 500 MG tablet Commonly known as: VITAMIN C Take 500 mg daily by mouth.   atorvastatin 40 MG tablet Commonly known as:  LIPITOR TAKE TWO TABLETS BY MOUTH DAILY   azelastine 0.1 % nasal spray Commonly known as: ASTELIN Place 2 sprays into both nostrils 2 (two) times daily. Use in each nostril as directed   bisoprolol 5 MG tablet Commonly known as: ZEBETA TAKE HALF TABLET BY MOUTH DAILY   chlorthalidone 25 MG tablet Commonly known as: HYGROTON Take 1 tablet (25 mg total) by mouth daily.   clopidogrel 75 MG tablet Commonly known as: PLAVIX TAKE ONE TABLET BY MOUTH DAILY   COMPLETE SENIOR PO 1 tablet daily.   cyanocobalamin 500 MCG tablet Commonly known as: VITAMIN B12 Take 500 mcg by mouth daily.   diclofenac Sodium 1 % Gel Commonly known as: VOLTAREN APPLY TWO GRAMS TOPICALLY FOUR TIMES A DAY   donepezil 10 MG tablet Commonly known as: ARICEPT TAKE 1 TABLET AT BEDTIME   furosemide 20 MG tablet Commonly known as: LASIX TAKE ONE TABLET BY MOUTH DAILY   gabapentin 300 MG capsule Commonly known as: NEURONTIN TAKE ONE CAPSULE BY MOUTH AT BEDTIME   hydrALAZINE 50 MG tablet Commonly known as: APRESOLINE Take 1 tablet (50 mg total) by mouth 3 (three) times daily.   icosapent Ethyl 1 g capsule Commonly known as: VASCEPA TAKE ONE CAPSULE BY MOUTH TWICE A DAY   isosorbide mononitrate 30 MG 24 hr tablet Commonly known as: IMDUR TAKE ONE TABLET BY MOUTH TWICE A DAY   ketorolac 0.5 % ophthalmic solution Commonly known as: ACULAR ketorolac 0.5 % eye drops   levocetirizine 5 MG tablet Commonly known as: XYZAL TAKE ONE TABLET BY MOUTH EVERY EVENING   Medical Compression Stockings Misc   methimazole 10 MG tablet Commonly known as: TAPAZOLE TAKE ONE TABLET BY MOUTH DAILY   Muscle Rub 10-15 % Crea muscle rub   Myrbetriq 25 MG Tb24 tablet Generic drug: mirabegron ER TAKE ONE TABLET BY MOUTH DAILY   mirabegron ER 25 MG Tb24 tablet Commonly known as: Myrbetriq Take 1 tablet (25 mg total) by mouth daily.   tamsulosin 0.4 MG Caps capsule Commonly known as: FLOMAX TAKE ONE CAPSULE  BY MOUTH DAILY HALF HOUR FOLLOWING THE SAME MEAL DAILY   Vitamin D3 125 MCG (5000 UT) Caps Take 5,000 Units by mouth daily.          REVIEW OF SYSTEMS: A comprehensive ROS was conducted with the patient and is negative except as per HPI and below:  ROS     OBJECTIVE:  VS: BP (!) 146/80 (BP Location: Left Arm, Patient Position: Sitting, Cuff Size: Large)   Pulse 84   Ht 6' (1.829 m)   Wt 237 lb (107.5 kg)   SpO2 96%   BMI 32.14 kg/m    Wt Readings from Last 3 Encounters:  08/20/22 237 lb (107.5 kg)  07/27/22 243 lb (110.2 kg)  04/16/22 231 lb (104.8  kg)     EXAM: General: Pt appears well and is in NAD  Eyes: External eye exam normal without stare, lid lag or exophthalmos.  EOM intact.   Neck: General: Supple without adenopathy. Thyroid: Thyroid size normal.  No goiter or nodules appreciated.   Lungs: Clear with good BS bilat with no rales, rhonchi, or wheezes  Heart: Auscultation: RRR.  Abdomen: Normoactive bowel sounds, soft, nontender, without masses or organomegaly palpable  Extremities:  BL LE: No pretibial edema normal ROM and strength.  Mental Status: Judgment, insight: Intact Orientation: Oriented to time, place, and person Mood and affect: No depression, anxiety, or agitation     DATA REVIEWED: ***    Latest Reference Range & Units 07/28/22 00:13  Sodium 135 - 145 mmol/L 143  Potassium 3.5 - 5.1 mmol/L 4.1  Chloride 98 - 111 mmol/L 111  CO2 22 - 32 mmol/L 25  Glucose 70 - 99 mg/dL 119 (H)  BUN 8 - 23 mg/dL 16  Creatinine 0.61 - 1.24 mg/dL 1.34 (H)  Calcium 8.9 - 10.3 mg/dL 9.3  Anion gap 5 - 15  7  Alkaline Phosphatase 38 - 126 U/L 60  Albumin 3.5 - 5.0 g/dL 3.3 (L)  AST 15 - 41 U/L 26  ALT 0 - 44 U/L 18  Total Protein 6.5 - 8.1 g/dL 7.4  Total Bilirubin 0.3 - 1.2 mg/dL 0.6  GFR, Estimated >60 mL/min 51 (L)  Troponin I (High Sensitivity) <18 ng/L 25 (H)  WBC 4.0 - 10.5 K/uL 6.3  RBC 4.22 - 5.81 MIL/uL 3.78 (L)  Hemoglobin 13.0 - 17.0 g/dL  11.4 (L)  HCT 39.0 - 52.0 % 36.6 (L)  MCV 80.0 - 100.0 fL 96.8  MCH 26.0 - 34.0 pg 30.2  MCHC 30.0 - 36.0 g/dL 31.1  RDW 11.5 - 15.5 % 13.6  Platelets 150 - 400 K/uL 171  nRBC 0.0 - 0.2 % 0.0  Neutrophils % 71  Lymphocytes % 19  Monocytes Relative % 7  Eosinophil % 1  Basophil % 1  Immature Granulocytes % 1  NEUT# 1.7 - 7.7 K/uL 4.5  Lymphocyte # 0.7 - 4.0 K/uL 1.2  Monocyte # 0.1 - 1.0 K/uL 0.4  Eosinophils Absolute 0.0 - 0.5 K/uL 0.1  Basophils Absolute 0.0 - 0.1 K/uL 0.0  Abs Immature Granulocytes 0.00 - 0.07 K/uL 0.03      Thyroid ultrasound 02/05/2019  Estimated total number of nodules >/= 1 cm: 2   Number of spongiform nodules >/=  2 cm not described below (TR1): 0   Number of mixed cystic and solid nodules >/= 1.5 cm not described below (TR2): 0   _________________________________________________________   Nodule # 1:   Location: Right; Mid   Maximum size: 2.5 cm; Other 2 dimensions: 2.0 x 1.4 cm   Composition: solid/almost completely solid (2)   Echogenicity: isoechoic (1)   Shape: not taller-than-wide (0)   Margins: smooth (0)   Echogenic foci: none (0)   ACR TI-RADS total points: 3.   ACR TI-RADS risk category: TR3 (3 points).   ACR TI-RADS recommendations:   **Given size (>/= 2.5 cm) and appearance, fine needle aspiration of this mildly suspicious nodule should be considered based on TI-RADS criteria.   _________________________________________________________   Nodule # 2:   Location: Left; Mid   Maximum size: 7.2 cm; Other 2 dimensions: 4.5 x 4.8 cm   Composition: solid/almost completely solid (2)   Echogenicity: isoechoic (1)   Shape: not taller-than-wide (0)   Margins: smooth (  0)   Echogenic foci: none (0)   ACR TI-RADS total points: 3.   ACR TI-RADS risk category: TR3 (3 points).   ACR TI-RADS recommendations:   **Given size (>/= 2.5 cm) and appearance, fine needle aspiration of this mildly suspicious nodule should be  considered based on TI-RADS criteria.   _________________________________________________________   IMPRESSION: 1. Nodule # 1, a 2.5 cm TI-RADS category 3 nodule in the medial right mid gland meets criteria for consideration of fine-needle aspiration biopsy. 2. Nodule # 2, a large 7.2 cm TI-RADS category 3 nodule occupying the majority of the left thyroid gland meets criteria for consideration of fine-needle aspiration biopsy.   FNA left mid 7.2 cm nodule 04/15/2019  THYROID FINE NEEDLE ASPIRATION LEFT LOBE (SPECIMEN 2 OF 2, COLLECTED 04/15/2019): CONSISTENT WITH BENIGN FOLLICULAR NODULE (BETHESDA CATEGORY II).   FNA right mid 2.5 cm nodule 04/15/2019   THYROID FINE NEEDLE ASPIRATION MID RIGHT LOBE (SPECIMEN 1 OF 2, COLLECTED 04/15/2019): CONSISTENT WITH BENIGN FOLLICULAR NODULE (BETHESDA CATEGORY II).  ASSESSMENT/PLAN/RECOMMENDATIONS:   Multinodular goiter:  -No local neck symptoms -Patient is s/p benign FNA of bilateral nodules in 2020 -We will proceed with repeat thyroid ultrasound -This is most likely the reason for his hyperthyroidism    2.  Subclinical hyperthyroid:  -Patient is clinically euthyroid -This was a limited visit as the patient is a poor historian, with hearing difficulty and did not bring his medication list, thus was unable to confirm the intake of medications   Medication Methimazole 10 mg    Signed electronically by: Lyndle Herrlich, MD  [facility] Endocrinology  [facility] [address] [place] [place], [place] [address] Phone: [phone] FAX: [phone]   CC: [name], FNP [address] [place] [address] Phone: [phone] Fax: [phone]   Return to Endocrinology clinic as below: Future Appointments  Date Time Provider Department Center  08/21/2022  2:00 PM [name], FNP [facility] None  08/26/2022 10:30 AM Little, Karma Lew, RN [facility] None  08/30/2022 11:00 AM [facility]  PHARMACIST [facility] None  10/23/2022 11:30 AM Patwardhan, Anabel Bene, MD [facility] None  01/03/2023  1:00 PM Avett Reineck, Konrad Dolores, MD [facility] None  03/05/2023  2:00 PM [facility] None  03/05/2023  2:40 PM [name], FNP [facility] None

## 2022-08-21 ENCOUNTER — Telehealth: Payer: Self-pay | Admitting: Internal Medicine

## 2022-08-21 ENCOUNTER — Encounter: Payer: Medicare HMO | Admitting: Nurse Practitioner

## 2022-08-21 MED ORDER — METHIMAZOLE 5 MG PO TABS: 5.0000 mg | ORAL_TABLET | Freq: Every day | ORAL | 1 refills | Status: DC

## 2022-08-21 NOTE — Telephone Encounter (Signed)
Please let the patient /caregiver know that his thyroid continues to be overactive    I have represcribed methimazole 5 mg, 1 tablet daily     Thanks  Abby Nena Jordan, MD  [facility] Endocrinology  [facility] [address] [place], [place] [address] Phone: [phone] FAX: [phone]

## 2022-08-21 NOTE — Telephone Encounter (Signed)
Can you please contact his pharmacy and check when was the last pick up of methimazole and how many tablets he received ?   Thanks

## 2022-08-21 NOTE — Telephone Encounter (Signed)
Per [place] last script for Methimazole was picked up 02/24/21 for a Qty of 30.

## 2022-08-21 NOTE — Progress Notes (Deleted)
I,Steffany Schoenfelder,acting as a Neurosurgeon for [employer], FNP.,have documented all relevant documentation on the behalf of [name], FNP,as directed by  [name], FNP while in the presence of [name], FNP.  Subjective:     Patient ID: Jesus Lam , male    DOB: 12-04-1932 , 88 y.o.   MRN: [id number]   No chief complaint on file.   HPI  Patient presents today for HM.     Past Medical History:  Diagnosis Date  . Anemia   . BPH (benign prostatic hyperplasia)   . Cataracts, bilateral   . CVA (cerebral vascular accident) (HCC)   . DVT (deep venous thrombosis) (HCC)    dvt in left leg  . Dyslipidemia   . Gout   . Gout   . HTN (hypertension)   . Left hip pain 03/15/2020  . Left leg pain   . Osteoarthritis   . PAD (peripheral artery disease) (HCC)   . Stasis dermatitis   . Stroke (HCC)   . Vitamin D deficiency      Family History  Problem Relation Age of Onset  . Cancer Mother   . Cancer Father   . Alcohol abuse Father   . Breast cancer Daughter   . Stroke Daughter   . CVA Daughter      Current Outpatient Medications:  .  amLODipine (NORVASC) 5 MG tablet, TAKE ONE TABLET BY MOUTH DAILY, Disp: 90 tablet, Rfl: 7 .  atorvastatin (LIPITOR) 40 MG tablet, TAKE TWO TABLETS BY MOUTH DAILY, Disp: 90 tablet, Rfl: 5 .  azelastine (ASTELIN) 0.1 % nasal spray, Place 2 sprays into both nostrils 2 (two) times daily. Use in each nostril as directed, Disp: 30 mL, Rfl: 5 .  bisoprolol (ZEBETA) 5 MG tablet, TAKE HALF TABLET BY MOUTH DAILY, Disp: 60 tablet, Rfl: 2 .  chlorthalidone (HYGROTON) 25 MG tablet, Take 1 tablet (25 mg total) by mouth daily., Disp: 5 tablet, Rfl: 0 .  Cholecalciferol (VITAMIN D3) 5000 units CAPS, Take 5,000 Units by mouth daily., Disp: , Rfl:  .  clopidogrel (PLAVIX) 75 MG tablet, TAKE ONE TABLET BY MOUTH DAILY, Disp: 90 tablet, Rfl: 10 .  diclofenac Sodium (VOLTAREN) 1 % GEL, APPLY TWO GRAMS TOPICALLY FOUR TIMES A DAY, Disp: 100 g, Rfl: 1 .  donepezil  (ARICEPT) 10 MG tablet, TAKE 1 TABLET AT BEDTIME, Disp: 90 tablet, Rfl: 1 .  Elastic Bandages & Supports (MEDICAL COMPRESSION STOCKINGS) MISC, , Disp: , Rfl:  .  furosemide (LASIX) 20 MG tablet, TAKE ONE TABLET BY MOUTH DAILY, Disp: 90 tablet, Rfl: 4 .  gabapentin (NEURONTIN) 300 MG capsule, TAKE ONE CAPSULE BY MOUTH AT BEDTIME, Disp: 90 capsule, Rfl: 0 .  hydrALAZINE (APRESOLINE) 50 MG tablet, Take 1 tablet (50 mg total) by mouth 3 (three) times daily., Disp: 270 tablet, Rfl: 3 .  icosapent Ethyl (VASCEPA) 1 g capsule, TAKE ONE CAPSULE BY MOUTH TWICE A DAY, Disp: 60 capsule, Rfl: 0 .  isosorbide mononitrate (IMDUR) 30 MG 24 hr tablet, TAKE ONE TABLET BY MOUTH TWICE A DAY, Disp: 60 tablet, Rfl: 0 .  ketorolac (ACULAR) 0.5 % ophthalmic solution, ketorolac 0.5 % eye drops, Disp: , Rfl:  .  levocetirizine (XYZAL) 5 MG tablet, TAKE ONE TABLET BY MOUTH EVERY EVENING, Disp: 90 tablet, Rfl: 4 .  Menthol-Methyl Salicylate (MUSCLE RUB) 10-15 % CREA, muscle rub, Disp: , Rfl:  .  methimazole (TAPAZOLE) 10 MG tablet, TAKE ONE TABLET BY MOUTH DAILY, Disp: 30 tablet, Rfl: 0 .  mirabegron ER (MYRBETRIQ) 25 MG TB24 tablet, Take 1 tablet (25 mg total) by mouth daily., Disp: 30 tablet, Rfl: 1 .  Multiple Vitamins-Minerals (COMPLETE SENIOR PO), 1 tablet daily. , Disp: , Rfl:  .  MYRBETRIQ 25 MG TB24 tablet, TAKE ONE TABLET BY MOUTH DAILY, Disp: 30 tablet, Rfl: 0 .  tamsulosin (FLOMAX) 0.4 MG CAPS capsule, TAKE ONE CAPSULE BY MOUTH DAILY HALF HOUR FOLLOWING THE SAME MEAL DAILY, Disp: 90 capsule, Rfl: 4 .  vitamin B-12 (CYANOCOBALAMIN) 500 MCG tablet, Take 500 mcg by mouth daily., Disp: , Rfl:  .  vitamin C (ASCORBIC ACID) 500 MG tablet, Take 500 mg daily by mouth., Disp: , Rfl:   Current Facility-Administered Medications:  .  0.9 %  sodium chloride infusion, , Intravenous, PRN, Ghumman, Ramandeep, NP   Allergies  Allergen Reactions  . Shellfish Allergy Other (See Comments)    Gout      Men's preventive visit.  Patient Health Questionnaire (PHQ-2) is  Flowsheet Row Clinical Support from 02/14/2022 in [facility]  PHQ-2 Total Score 0     . Patient is on a *** diet. Marital status: Single. Relevant history for alcohol use is:  Social History   Substance and Sexual Activity  Alcohol Use Yes  . Alcohol/week: 6.0 - 7.0 standard drinks of alcohol  . Types: 6 - 7 Shots of liquor per week   Comment: 6-7 drinks per week  . Relevant history for tobacco use is:  Social History   Tobacco Use  Smoking Status Former  . Packs/day: 0.25  . Years: 0.50  . Total pack years: 0.13  . Types: Cigarettes  . Quit date: 82  . Years since quitting: 43.7  Smokeless Tobacco Never  .   Review of Systems  Constitutional: Negative.   HENT: Negative.    Eyes: Negative.   Respiratory: Negative.    Cardiovascular: Negative.   Gastrointestinal: Negative.   Endocrine: Negative.   Genitourinary: Negative.   Musculoskeletal: Negative.   Skin: Negative.   Allergic/Immunologic: Negative.   Neurological: Negative.   Hematological: Negative.   Psychiatric/Behavioral: Negative.      There were no vitals filed for this visit. There is no height or weight on file to calculate BMI.   Objective:  Physical Exam      Assessment And Plan:    There are no diagnoses linked to this encounter.    Patient was given opportunity to ask questions. Patient verbalized understanding of the plan and was able to repeat key elements of the plan. All questions were answered to their satisfaction.   Octavio Manns   I, Octavio Manns, have reviewed all documentation for this visit. The documentation on 08/21/22 for the exam, diagnosis, procedures, and orders are all accurate and complete.  THE PATIENT IS ENCOURAGED TO PRACTICE SOCIAL DISTANCING DUE TO THE COVID-19 PANDEMIC.

## 2022-08-22 NOTE — Telephone Encounter (Signed)
Patients daughter informed of results expressed understanding.

## 2022-08-23 ENCOUNTER — Telehealth: Payer: Medicare HMO

## 2022-08-23 NOTE — Progress Notes (Signed)
Not seen

## 2022-08-26 ENCOUNTER — Ambulatory Visit: Payer: Self-pay

## 2022-08-26 NOTE — Patient Instructions (Signed)
Visit Information  Thank you for taking time to visit with me today. Please don't hesitate to contact me if I can be of assistance to you.   Following are the goals we discussed today:   Goals Addressed               This Visit's Progress     Patient Stated     I have restarted taking my thyroid medication (pt-stated)        Care Coordination Interventions: Evaluation of current treatment plan related to hyperthyroidism and patient's adherence to plan as established by provider Determined patient completed recent follow up with Endocrinologist, Dr. Kelton Pillar Review of patient status, including review of consultant's reports, relevant laboratory and other test results Reviewed medications with patient and discussed importance of medication adherence Instructed patient to report new symptoms or concerns to his thyroid doctor promptly Reviewed scheduled/upcoming provider appointments            Our next appointment is by telephone on 10/25/22 at 0930 AM  Please call the care guide team at [phone] if you need to cancel or reschedule your appointment.   If you are experiencing a Mental Health or [place] or need someone to talk to, please call 1-800-273-TALK (toll free, 24 hour hotline)  The patient verbalized understanding of instructions, educational materials, and care plan provided today and agreed to receive a mailed copy of patient instructions, educational materials, and care plan.   Barb Merino, RN, BSN, CCM Care Management Coordinator [place] [facility] Direct Phone: [phone]

## 2022-08-26 NOTE — Patient Outreach (Signed)
  Care Coordination   Follow Up Visit Note   08/26/2022 Name: Jesus Lam MRN: [id number] DOB: [birth year]  Jesus Lam is a 88 y.o. year old male who sees Jesus Lam, [place] for primary care. I spoke with  Jesus Lam by phone today.  What matters to the patients health and wellness today?  Patient restarted his thyroid medication.     Goals Addressed               This Visit's Progress     Patient Stated     I have restarted taking my thyroid medication (pt-stated)        Care Coordination Interventions: Evaluation of current treatment plan related to hyperthyroidism and patient's adherence to plan as established by provider Determined patient completed recent follow up with Endocrinologist, Jesus Lam Review of patient status, including review of consultant's reports, relevant laboratory and other test results Reviewed medications with patient and discussed importance of medication adherence Instructed patient to report new symptoms or concerns to his thyroid doctor promptly Reviewed scheduled/upcoming provider appointments            SDOH assessments and interventions completed:  No     Care Coordination Interventions Activated:  Yes  Care Coordination Interventions:  Yes, provided   Follow up plan: Follow up call scheduled for 10/25/22 @09 :30 AM    Encounter Outcome:  Pt. Visit Completed

## 2022-08-28 ENCOUNTER — Telehealth: Payer: Self-pay

## 2022-08-28 NOTE — Chronic Care Management (AMB) (Addendum)
08-28-2022: Patient's appointment with [name] on 08-30-2022 was rescheduled to 09-04-2022 due to provider being out of office.   Jesus Lam was reminded to have all medications, supplements and any blood glucose and blood pressure readings available for review with [name], Pharm. D, at his telephone visit on 09-04-2022 at 12:00   Questions: Have you had any recent office visit or specialist visit outside of [place]? Patient stated no  Are there any concerns you would like to discuss during your office visit? Patient stated no  Are you having any problems obtaining your medications? (Whether it pharmacy issues or cost) Patient stated no  If patient has any PAP medications ask if they are having any problems getting their PAP medication or refill? No PAP meds  Care Gaps: Covid booster overdue Shingrix overdue Flu vaccine  Star Rating Drug: Atorvastatin 40 mg- Last filled 08-26-2022 30 [facility].  Any gaps in medications fill history? No   [place] Pharmacist Assistant [phone]

## 2022-08-30 ENCOUNTER — Telehealth: Payer: Medicare HMO

## 2022-09-03 ENCOUNTER — Ambulatory Visit
Admission: RE | Admit: 2022-09-03 | Discharge: 2022-09-03 | Disposition: A | Discharge status: Home / Self Care | Payer: Medicare HMO | Source: Ambulatory Visit | Attending: Internal Medicine | Admitting: Internal Medicine

## 2022-09-03 DIAGNOSIS — E0590 Thyrotoxicosis, unspecified without thyrotoxic crisis or storm: Secondary | ICD-10-CM

## 2022-09-03 DIAGNOSIS — E042 Nontoxic multinodular goiter: Secondary | ICD-10-CM

## 2022-09-04 ENCOUNTER — Telehealth: Payer: Self-pay

## 2022-09-04 ENCOUNTER — Ambulatory Visit (INDEPENDENT_AMBULATORY_CARE_PROVIDER_SITE_OTHER): Payer: Medicare HMO

## 2022-09-04 DIAGNOSIS — I10 Essential (primary) hypertension: Secondary | ICD-10-CM

## 2022-09-04 DIAGNOSIS — I739 Peripheral vascular disease, unspecified: Secondary | ICD-10-CM

## 2022-09-04 NOTE — Telephone Encounter (Signed)
Pt was seen 09/03/22 for Ultrasound and results are available in Epic. Diane with [facility] called to advise results should be reviewed as soon as possible specifically impression 2 and 3 from report.

## 2022-09-04 NOTE — Progress Notes (Signed)
Chronic Care Management Pharmacy Note  09/10/2022 Name:  Jesus Lam MRN:  [id number] DOB:  09/24/1933  Summary: Patient reports that he is doing well and has no issues. His daughter reports that she is worried about the him  taking Hydralazine three times a day and having a dizzy spell on Monday where he did not fall.   Recommendations/Changes made from today's visit: Recommend patient follow up with cardiology team because he is checking his BP at home, but uncertain if the readings are being received by cardiology  Patients daughter is concerned that his BP readings are sometimes too low   Plan: [name] with daughter is going to call [facility] to confirm transmission of BP readings.  Encourage patient to use BLIP machine to check BP at home at the same time each day  Collaborated with PCP team patient to have physical and office visit rescheduled for the next two weeks, orthostatics to be completed.    Subjective: Jesus Lam is an 88 y.o. year old male who is a primary patient of System, Provider Not In.  The CCM team was consulted for assistance with disease management and care coordination needs.    Engaged with patient by telephone for follow up visit in response to provider referral for pharmacy case management and/or care coordination services.   Consent to Services:  The patient was given information about Chronic Care Management services, agreed to services, and gave verbal consent prior to initiation of services.  Please see initial visit note for detailed documentation.   Patient Care Team: System, Provider Not In as PCP - General Little, Claudette Stapler, RN as Case Manager Mayford Knife, [facility] (Pharmacist)  Recent office visits: None patient needs office visit, collaborated with PCP team to have patients visit scheduled in the next two weeks,   Recent consult visits: 08/20/2022 Endocrinology [facility] visits: 07/28/2022 ED Visit - syncope  03/26/2022  ED  visit    Objective:  Lab Results  Component Value Date   CREATININE 1.36 (H) 09/05/2022   BUN 27 (H) 09/05/2022   EGFR 54 (L) 12/24/2021   GFRNONAA 50 (L) 09/05/2022   GFRAA 64 08/03/2020   NA 139 09/05/2022   K 4.5 09/05/2022   CALCIUM 9.2 09/05/2022   CO2 25 09/05/2022   GLUCOSE 103 (H) 09/05/2022    Lab Results  Component Value Date/Time   HGBA1C 5.4 12/24/2021 04:41 PM   HGBA1C 5.1 02/03/2020 03:37 PM   MICROALBUR 10 02/08/2021 03:00 PM   MICROALBUR 30 02/03/2020 03:24 PM    Last diabetic Eye exam: No results found for: "HMDIABEYEEXA"  Last diabetic Foot exam: No results found for: "HMDIABFOOTEX"   Lab Results  Component Value Date   CHOL 111 12/24/2021   HDL 36 (L) 12/24/2021   LDLCALC 51 12/24/2021   TRIG 135 12/24/2021   CHOLHDL 3.1 12/24/2021       Latest Ref Rng & Units 09/05/2022   11:35 AM 07/28/2022   12:13 AM 02/08/2021    3:29 PM  Hepatic Function  Total Protein 6.5 - 8.1 g/dL 7.8  7.4  7.4   Albumin 3.5 - 5.0 g/dL 3.8  3.3  4.0   AST 15 - 41 U/L _0 ALT 0 - 44 U/L _1 Alk Phosphatase 38 - 126 U/L 74  60  93   Total Bilirubin 0.3 - 1.2 mg/dL 0.7  0.6  0.5  Lab Results  Component Value Date/Time   TSH 0.07 (L) 08/20/2022 01:33 PM   TSH 0.056 (L) 12/24/2021 04:41 PM   FREET4 0.96 08/20/2022 01:33 PM   FREET4 1.13 01/28/2019 04:06 PM       Latest Ref Rng & Units 09/05/2022   11:35 AM 07/28/2022   12:13 AM 01/28/2019    4:06 PM  CBC  WBC 4.0 - 10.5 K/uL 5.7  6.3  6.2   Hemoglobin 13.0 - 17.0 g/dL 12.0  11.4  11.7   Hematocrit 39.0 - 52.0 % 37.5  36.6  36.4   Platelets 150 - 400 K/uL 190  171  194     No results found for: "VD25OH"  Clinical ASCVD: Yes  The ASCVD Risk score (Arnett DK, et al., 2019) failed to calculate for the following reasons:   The 2019 ASCVD risk score is only valid for ages 65 to 16   The patient has a prior MI or stroke diagnosis       02/14/2022    2:26 PM 02/08/2021    2:28 PM 02/03/2020     2:49 PM  Depression screen PHQ 2/9  Decreased Interest 0 0 0  Down, Depressed, Hopeless 0 3 0  PHQ - 2 Score 0 3 0  Altered sleeping  0 0  Tired, decreased energy  3 0  Change in appetite  0 0  Feeling bad or failure about yourself   0 0  Trouble concentrating  3 0  Moving slowly or fidgety/restless  0 0  Suicidal thoughts  0 0  PHQ-9 Score  9 0  Difficult doing work/chores  Somewhat difficult Not difficult at all     Social History   Tobacco Use  Smoking Status Former   Packs/day: 0.25   Years: 0.50   Total pack years: 0.13   Types: Cigarettes   Quit date: 1980   Years since quitting: 43.8  Smokeless Tobacco Never   BP Readings from Last 3 Encounters:  09/05/22 (!) 149/79  08/20/22 (!) 146/80  07/28/22 129/71   Pulse Readings from Last 3 Encounters:  09/05/22 (!) 59  08/20/22 84  07/28/22 (!) 56   Wt Readings from Last 3 Encounters:  09/05/22 235 lb (106.6 kg)  08/20/22 237 lb (107.5 kg)  07/27/22 243 lb (110.2 kg)   BMI Readings from Last 3 Encounters:  09/05/22 31.87 kg/m  08/20/22 32.14 kg/m  07/27/22 32.96 kg/m    Assessment/Interventions: Review of patient past medical history, allergies, medications, health status, including review of consultants reports, laboratory and other test data, was performed as part of comprehensive evaluation and provision of chronic care management services.   SDOH:  (Social Determinants of Health) assessments and interventions performed: No SDOH Interventions    Flowsheet Row Clinical Support from 02/03/2020 in [facility]  SDOH Interventions   Depression Interventions/Treatment  PHQ2-9 Score <4 Follow-up Not Indicated      SDOH Screenings   Food Insecurity: No Food Insecurity (02/14/2022)  Housing: Low Risk  (02/08/2019)  Transportation Needs: No Transportation Needs (02/14/2022)  Alcohol Screen: Low Risk  (02/08/2019)  Depression (PHQ2-9): Low Risk  (02/14/2022)  Financial Resource Strain: Low  Risk  (02/14/2022)  Physical Activity: Sufficiently Active (02/14/2022)  Stress: No Stress Concern Present (02/14/2022)  Tobacco Use: Medium Risk (09/05/2022)    CCM Care Plan  Allergies  Allergen Reactions   Shellfish Allergy Other (See Comments)    Gout     Medications Reviewed Today  Reviewed by Dessie Coma, CPhT (Pharmacy Technician) on 09/05/22 at 1526  Med List Status: Awaiting Fax   Medication Order Taking? Sig Documenting Provider Last Dose Status Informant  0.9 %  sodium chloride infusion [id number]   Ghumman, Ramandeep, NP  Active   amLODipine (NORVASC) 5 MG tablet [id number]  TAKE ONE TABLET BY MOUTH DAILY Patwardhan, Manish J, MD  Active   atorvastatin (LIPITOR) 40 MG tablet [id number]  TAKE TWO TABLETS BY MOUTH DAILY Patwardhan, Manish J, MD  Active   azelastine (ASTELIN) 0.1 % nasal spray [id number]  Place 2 sprays into both nostrils 2 (two) times daily. Use in each nostril as directed Minette Brine, FNP  Active   bisoprolol (ZEBETA) 5 MG tablet [id number]  TAKE HALF TABLET BY MOUTH DAILY Minette Brine, FNP  Active   chlorthalidone (HYGROTON) 25 MG tablet [id number]  Take 1 tablet (25 mg total) by mouth daily. [place] Chard, MD  Active   Cholecalciferol (VITAMIN D3) 5000 units CAPS [id number]  Take 5,000 Units by mouth daily. [provider]  Active   clopidogrel (PLAVIX) 75 MG tablet [id number]  TAKE ONE TABLET BY MOUTH DAILY Patwardhan, Manish J, MD  Active   diclofenac Sodium (VOLTAREN) 1 % GEL [id number]  APPLY TWO GRAMS TOPICALLY FOUR TIMES A Nolene Ebbs, FNP  Active   donepezil (ARICEPT) 10 MG tablet [id number]  TAKE 1 TABLET AT BEDTIME Minette Brine, FNP  Active   Elastic Bandages & Supports ([place]) [place] [id number]   [provider]  Active   furosemide (LASIX) 20 MG tablet [id number]  TAKE ONE TABLET BY MOUTH DAILY Minette Brine, FNP  Active   gabapentin (NEURONTIN) 300 MG capsule [id number]  TAKE ONE CAPSULE BY MOUTH AT  BEDTIME Minette Brine, FNP  Active   hydrALAZINE (APRESOLINE) 50 MG tablet [id number]  Take 1 tablet (50 mg total) by mouth 3 (three) times daily. Patwardhan, Reynold Bowen, MD  Active   icosapent Ethyl (VASCEPA) 1 g capsule [id number]  TAKE ONE CAPSULE BY MOUTH TWICE A DAY Patwardhan, Manish J, MD  Active   isosorbide mononitrate (IMDUR) 30 MG 24 hr tablet [id number]  TAKE ONE TABLET BY MOUTH TWICE A DAY Patwardhan, Manish J, MD  Active   ketorolac (ACULAR) 0.5 % ophthalmic solution [id number]  ketorolac 0.5 % eye drops [provider]  Active   levocetirizine (XYZAL) 5 MG tablet [id number]  TAKE ONE TABLET BY MOUTH EVERY Wenda Low, FNP  Active   Menthol-Methyl Salicylate (MUSCLE RUB) 10-15 % CREA [id number]  muscle rub [provider]  Active   methimazole (TAPAZOLE) 5 MG tablet [id number]  Take 1 tablet (5 mg total) by mouth daily. Shamleffer, Melanie Crazier, MD  Active   mirabegron ER (MYRBETRIQ) 25 MG TB24 tablet [id number]  Take 1 tablet (25 mg total) by mouth daily. Minette Brine, FNP  Active   Multiple Vitamins-Minerals (COMPLETE SENIOR PO) [id number]  1 tablet daily.  [provider]  Active   MYRBETRIQ 25 MG TB24 tablet [id number]  TAKE ONE TABLET BY MOUTH DAILY Minette Brine, FNP  Active   tamsulosin (FLOMAX) 0.4 MG CAPS capsule [id number]  TAKE ONE CAPSULE BY MOUTH DAILY HALF HOUR FOLLOWING THE SAME MEAL DAILY Minette Brine, FNP  Active   vitamin B-12 (CYANOCOBALAMIN) 500 MCG tablet [id number]  Take 500 mcg by mouth daily. [provider]  Active   vitamin C (ASCORBIC ACID) 500 MG tablet [id number]  Take 500 mg daily by mouth. [provider]  Active  Patient Active Problem List   Diagnosis Date Noted   Leg edema 08/01/2021   Mild cognitive impairment 09/07/2020   Chronic pain of left knee 03/15/2020   Pure hypercholesterolemia 11/15/2019   First degree AV block 07/28/2019   Hypertriglyceridemia 04/29/2019   Mixed  hyperlipidemia 04/29/2019   CVA (cerebral vascular accident) ([place])    Bradycardia    Subclinical hyperthyroidism 11/18/2018   Peripheral artery disease ([place]) 06/13/2018   Ischemic cerebrovascular accident (CVA) ([place]) 06/13/2018   Dysarthria 06/12/2018   Renal insufficiency 06/12/2018   Claudication ([place]) 01/11/2018   Anemia 10/10/2017   Essential hypertension 11/04/2013    Immunization History  Administered Date(s) Administered   Fluad Quad(high Dose 65+) 08/03/2020, 08/15/2021   Influenza, High Dose Seasonal PF 10/29/2018, 08/11/2019   Moderna SARS-COV2 Booster Vaccination 11/15/2020   PFIZER(Purple Top)SARS-COV-2 Vaccination 01/17/2020, 01/29/2020   Pfizer Covid-19 Vaccine Bivalent Booster 43yr & up 11/09/2021   Pneumococcal Polysaccharide-23 09/18/2021   Tdap 03/26/2022   Unspecified SARS-COV-2 Vaccination 05/04/2021   Zoster Recombinat (Shingrix) 02/14/2022    Conditions to be addressed/monitored:  Hypertension and Hyperlipidemia  Care Plan : CBenson Updates made by PMayford Knife RLa Junta Gardenssince 09/10/2022 12:00 AM     Problem: HTN, PAD   Priority: High     Long-Range Goal: Disease Management   Recent Progress: On track  Note:    Current Barriers:  Unable to independently monitor therapeutic efficacy  Pharmacist Clinical Goal(s):  Patient will achieve adherence to monitoring guidelines and medication adherence to achieve therapeutic efficacy through collaboration with PharmD and provider.   Interventions: 1:1 collaboration with MMinette Brine FNP regarding development and update of comprehensive plan of care as evidenced by provider attestation and co-signature Inter-disciplinary care team collaboration (see longitudinal plan of care) Comprehensive medication review performed; medication list updated in electronic medical record  Hypertension (BP goal <130/80) -Controlled -Current treatment: Hydralazine 100 mg tablet take 1 tablet by mouth  three times daily Appropriate, Effective, Safe, Accessible Amlodipine 5 mg tablet once per day Appropriate, Effective, Safe, Accessible Isosorbide mononitrate 30 mg tablet three times per day Appropriate, Effective, Safe, Accessible Bisoprolol 5 mg tablet take 1/2 tablet by mouth daily Appropriate, Effective, Safe, Accessible Chlorthalidone 25 mg tablet once per day Appropriate, Effective, Safe, Accessible -Current home readings: Patient is checking his BP at home sometimes daughter reports that on Monday he seemed dizzy and she is concerned that it was because of a low BP reading - 09/04/2022 124/58 pulse 56 -Current exercise habits: patient reports that he is exercising.  -Reports hypotensive/hypertensive symptoms -Educated on BP goals and benefits of medications for prevention of heart attack, stroke and kidney damage; Importance of home blood pressure monitoring; Proper BP monitoring technique; Symptoms of hypotension and importance of maintaining adequate hydration; -Counseled to monitor BP at home at least once per day, document, and provide log at future appointment -Patient reports that he is drinking 6-4 bottles 12 ounce bottles of water per day.  -Collaborated with patients daughter MRip Harbourthe youngest, and she is going to write down his BP readings and give them to the [facility].  -Patient reports checking BP daily and it going to cardiology daily and they call him when there is an issue. He reports he was called two weeks ago  -Spoke with pharmacist from cardiology AElmo Putt who reports that the BP readings have been normal and his BP is optimized -Collaborated with PCP team to determine when patient should be seen for a visit. Start  checking  BP using BLIP care machine as given by cardiology to make sure readings are received by cardiology team     Peripheral Arterial Disease: (LDL goal < 70) -Controlled -Current treatment: Atorvastatin 40 mg tablet once per day Appropriate,  Effective, Safe, Accessible  -Current dietary patterns: will discuss during next office visit -Educated on Cholesterol goals;  Benefits of statin for ASCVD risk reduction; -Recommended to continue current medication  Patient Goals/Self-Care Activities Patient will:  - take medications as prescribed as evidenced by patient report and record review  Follow Up Plan: The patient has been provided with contact information for the care management team and has been advised to call with any health related questions or concerns.       Medication Assistance: None required.  Patient affirms current coverage meets needs.  Compliance/Adherence/Medication fill history: Care Gaps: COVID-19 Vaccine  Shingrix Vaccine  Influenza Vaccine   Star-Rating Drugs: Atorvastatin 40 mg tablet    Patient's preferred pharmacy is:  [facility], [place] [address] Phone: [phone] Fax: [place], [place] [address] Phone: [phone] Fax: [phone]  Uses pill box? Yes  Pt endorses 90% compliance  We discussed: Benefits of medication synchronization, packaging and delivery as well as enhanced pharmacist oversight with [employer]. Patient decided to: Continue current medication management strategy  Care Plan and Follow Up Patient Decision:  Patient agrees to Care Plan and Follow-up.  Plan: The patient has been provided with contact information for the care management team and has been advised to call with any health related questions or concerns.   [name], CPP, PharmD Clinical Pharmacist Practitioner [facility] [phone]

## 2022-09-05 ENCOUNTER — Emergency Department (HOSPITAL_BASED_OUTPATIENT_CLINIC_OR_DEPARTMENT_OTHER): Payer: Medicare HMO

## 2022-09-05 ENCOUNTER — Emergency Department (HOSPITAL_COMMUNITY): Payer: Medicare HMO

## 2022-09-05 ENCOUNTER — Emergency Department (HOSPITAL_COMMUNITY)
Admission: EM | Admit: 2022-09-05 | Discharge: 2022-09-05 | Disposition: A | Discharge status: Home / Self Care | Payer: Medicare HMO | Attending: Emergency Medicine | Admitting: Emergency Medicine

## 2022-09-05 ENCOUNTER — Encounter (HOSPITAL_COMMUNITY): Payer: Self-pay

## 2022-09-05 ENCOUNTER — Other Ambulatory Visit: Payer: Self-pay

## 2022-09-05 DIAGNOSIS — I82401 Acute embolism and thrombosis of unspecified deep veins of right lower extremity: Secondary | ICD-10-CM | POA: Diagnosis not present

## 2022-09-05 DIAGNOSIS — I10 Essential (primary) hypertension: Secondary | ICD-10-CM | POA: Insufficient documentation

## 2022-09-05 DIAGNOSIS — M7989 Other specified soft tissue disorders: Secondary | ICD-10-CM | POA: Insufficient documentation

## 2022-09-05 DIAGNOSIS — R799 Abnormal finding of blood chemistry, unspecified: Secondary | ICD-10-CM | POA: Diagnosis present

## 2022-09-05 DIAGNOSIS — Z7901 Long term (current) use of anticoagulants: Secondary | ICD-10-CM | POA: Diagnosis not present

## 2022-09-05 DIAGNOSIS — I8290 Acute embolism and thrombosis of unspecified vein: Secondary | ICD-10-CM | POA: Insufficient documentation

## 2022-09-05 DIAGNOSIS — Z79899 Other long term (current) drug therapy: Secondary | ICD-10-CM | POA: Diagnosis not present

## 2022-09-05 LAB — CBC WITH DIFFERENTIAL/PLATELET
Abs Immature Granulocytes: 0.04 10*3/uL (ref 0.00–0.07)
Basophils Absolute: 0 10*3/uL (ref 0.0–0.1)
Basophils Relative: 1 %
Eosinophils Absolute: 0.1 10*3/uL (ref 0.0–0.5)
Eosinophils Relative: 2 %
HCT: 37.5 % — ABNORMAL LOW (ref 39.0–52.0)
Hemoglobin: 12 g/dL — ABNORMAL LOW (ref 13.0–17.0)
Immature Granulocytes: 1 %
Lymphocytes Relative: 32 %
Lymphs Abs: 1.8 10*3/uL (ref 0.7–4.0)
MCH: 31.1 pg (ref 26.0–34.0)
MCHC: 32 g/dL (ref 30.0–36.0)
MCV: 97.2 fL (ref 80.0–100.0)
Monocytes Absolute: 0.6 10*3/uL (ref 0.1–1.0)
Monocytes Relative: 10 %
Neutro Abs: 3.1 10*3/uL (ref 1.7–7.7)
Neutrophils Relative %: 54 %
Platelets: 190 10*3/uL (ref 150–400)
RBC: 3.86 MIL/uL — ABNORMAL LOW (ref 4.22–5.81)
RDW: 13.4 % (ref 11.5–15.5)
WBC: 5.7 10*3/uL (ref 4.0–10.5)
nRBC: 0 % (ref 0.0–0.2)

## 2022-09-05 LAB — COMPREHENSIVE METABOLIC PANEL
ALT: 21 U/L (ref 0–44)
AST: 20 U/L (ref 15–41)
Albumin: 3.8 g/dL (ref 3.5–5.0)
Alkaline Phosphatase: 74 U/L (ref 38–126)
Anion gap: 5 (ref 5–15)
BUN: 27 mg/dL — ABNORMAL HIGH (ref 8–23)
CO2: 25 mmol/L (ref 22–32)
Calcium: 9.2 mg/dL (ref 8.9–10.3)
Chloride: 109 mmol/L (ref 98–111)
Creatinine, Ser: 1.36 mg/dL — ABNORMAL HIGH (ref 0.61–1.24)
GFR, Estimated: 50 mL/min — ABNORMAL LOW (ref >60)
Glucose, Bld: 103 mg/dL — ABNORMAL HIGH (ref 70–99)
Potassium: 4.5 mmol/L (ref 3.5–5.1)
Sodium: 139 mmol/L (ref 135–145)
Total Bilirubin: 0.7 mg/dL (ref 0.3–1.2)
Total Protein: 7.8 g/dL (ref 6.5–8.1)

## 2022-09-05 LAB — APTT: aPTT: 28 seconds (ref 24–36)

## 2022-09-05 LAB — PROTIME-INR
INR: 1.1 (ref 0.8–1.2)
Prothrombin Time: 13.9 seconds (ref 11.4–15.2)

## 2022-09-05 MED ORDER — APIXABAN 5 MG PO TABS: 5.0000 mg | ORAL_TABLET | Freq: Two times a day (BID) | ORAL | Status: DC

## 2022-09-05 MED ORDER — IOHEXOL 300 MG/ML  SOLN
75.0000 mL | Freq: Once | INTRAMUSCULAR | Status: AC | PRN
  Administered 2022-09-05: 75 mL via INTRAVENOUS

## 2022-09-05 MED ORDER — APIXABAN 5 MG PO TABS
10.0000 mg | ORAL_TABLET | Freq: Two times a day (BID) | ORAL | Status: DC
  Administered 2022-09-05: 10 mg via ORAL
  Filled 2022-09-05: qty 2

## 2022-09-05 MED ORDER — APIXABAN (ELIQUIS) VTE STARTER PACK (10MG AND 5MG): ORAL_TABLET | ORAL | 0 refills | Status: DC

## 2022-09-05 NOTE — ED Notes (Signed)
Pt states he is calling his daughter for a ride home.

## 2022-09-05 NOTE — Progress Notes (Signed)
ANTICOAGULATION CONSULT NOTE - Initial Consult  Pharmacy Consult for apixaban Indication: DVT of right internal jugular (IJ ) vein  Allergies  Allergen Reactions   Shellfish Allergy Other (See Comments)    Gout     Patient Measurements: Height: 6' (182.9 cm) Weight: 106.6 kg (235 lb) IBW/kg (Calculated) : 77.6  Vital Signs: Temp: 98.1 F (36.7 C) (10/12 1427) Temp Source: Oral (10/12 1427) BP: 148/89 (10/12 1600) Pulse Rate: 52 (10/12 1600)  Labs: Recent Labs    09/05/22 1135  HGB 12.0*  HCT 37.5*  PLT 190  APTT 28  LABPROT 13.9  INR 1.1  CREATININE 1.36*    Estimated Creatinine Clearance: 48.3 mL/min (A) (by C-G formula based on SCr of 1.36 mg/dL (H)).   Medical History: Past Medical History:  Diagnosis Date   Anemia    BPH (benign prostatic hyperplasia)    Cataracts, bilateral    CVA (cerebral vascular accident) ([place])    DVT (deep venous thrombosis) ([facility])    dvt in left leg   Dyslipidemia    Gout    Gout    HTN (hypertension)    Left hip pain 03/15/2020   Left leg pain    Osteoarthritis    PAD (peripheral artery disease) ([facility])    Stasis dermatitis    Stroke ([facility])    Vitamin D deficiency     Medications:  No anticoagulation prior to admission  Assessment: Pharmacy consulted to dose apixaban for this 88 yo male with history of hypertension, bradycardia, prior stroke, hyperlipidemia, peripheral arterial disease, presenting to the ED for after being notified by [facility] Endocrinology to go to ED for treatment of DVT.    10/10 US Thyroid: Incidental finding of right IJ vein thrombus 10/12 Right upper extremity venous duplex showed findings consistent with acute DVT involving the right IJ vein.    Goal of Therapy:  Treat acute right IJ DVT Monitor platelets by anticoagulation protocol: Yes   Plan:  Apixaban 10 mg twice daily for 7 days followed by 5 mg twice daily Provide apixaban education Monitor signs/symptoms of bleeding   Thank you for  allowing pharmacy to be a part of this patient's care.  Royetta Asal, PharmD, BCPS Clinical Pharmacist [place] Please utilize Amion for appropriate phone number to reach the unit pharmacist ([place]) 09/05/2022 4:25 PM

## 2022-09-05 NOTE — Progress Notes (Signed)
Right upper extremity venous duplex has been completed. Preliminary results can be found in CV Proc through chart review.  Results were given to Dr. Maylon Peppers.  09/05/22 2:45 PM Carlos Levering RVT

## 2022-09-05 NOTE — ED Provider Notes (Signed)
[place] DEPT Provider Note   CSN: [id number] Arrival date & time: 09/05/22  1029     History  Chief Complaint  Patient presents with   Abnormal Lab    Jesus Lam is a 88 y.o. male.  Patient is an 88 year old male with a past medical history of hypertension, prior DVT no longer on anticoagulation and prior CVA without residual deficits presenting to the emergency department with an abnormal finding on outpatient ultrasound.  Patient had a thyroid ultrasound performed this week to evaluate for thyroid nodules which incidentally showed a thrombus in his right IJ.  The patient denies any neck pain, facial swelling or arm swelling.  He denies any previous lines or foreign bodies in his IJ.  He denies any fevers or chills, cough, congestion, runny nose or sore throat.  The history is provided by the patient and a relative.  Abnormal Lab      Home Medications Prior to Admission medications   Medication Sig Start Date End Date Taking? Authorizing Provider  APIXABAN Arne Cleveland) VTE STARTER PACK (10MG  AND 5MG ) Take as directed on package: start with two-5mg  tablets twice daily for 7 days. On day 8, switch to one-5mg  tablet twice daily. 09/05/22  Yes Maylon Peppers, Korine Winton K, DO  amLODipine (NORVASC) 5 MG tablet TAKE ONE TABLET BY MOUTH DAILY 12/05/21   Patwardhan, Manish J, MD  atorvastatin (LIPITOR) 40 MG tablet TAKE TWO TABLETS BY MOUTH DAILY 08/02/22   Patwardhan, Manish J, MD  azelastine (ASTELIN) 0.1 % nasal spray Place 2 sprays into both nostrils 2 (two) times daily. Use in each nostril as directed 08/15/21   Minette Brine, FNP  bisoprolol (ZEBETA) 5 MG tablet TAKE HALF TABLET BY MOUTH DAILY 07/01/22   Minette Brine, FNP  chlorthalidone (HYGROTON) 25 MG tablet Take 1 tablet (25 mg total) by mouth daily. 02/07/20   Glendale Chard, MD  Cholecalciferol (VITAMIN D3) 5000 units CAPS Take 5,000 Units by mouth daily. 01/09/18   [provider]  clopidogrel  (PLAVIX) 75 MG tablet TAKE ONE TABLET BY MOUTH DAILY 03/25/22   Patwardhan, Manish J, MD  diclofenac Sodium (VOLTAREN) 1 % GEL APPLY TWO GRAMS TOPICALLY FOUR TIMES A DAY 07/01/22   Minette Brine, FNP  donepezil (ARICEPT) 10 MG tablet TAKE 1 TABLET AT BEDTIME 05/22/22   Minette Brine, FNP  Elastic Bandages & Supports (MEDICAL COMPRESSION STOCKINGS) [place]  06/06/18   [provider]  furosemide (LASIX) 20 MG tablet TAKE ONE TABLET BY MOUTH DAILY 09/12/21   Minette Brine, FNP  gabapentin (NEURONTIN) 300 MG capsule TAKE ONE CAPSULE BY MOUTH AT BEDTIME 01/11/21   Minette Brine, FNP  hydrALAZINE (APRESOLINE) 50 MG tablet Take 1 tablet (50 mg total) by mouth 3 (three) times daily. 01/10/22   Patwardhan, Reynold Bowen, MD  icosapent Ethyl (VASCEPA) 1 g capsule TAKE ONE CAPSULE BY MOUTH TWICE A DAY 02/08/21   Patwardhan, Manish J, MD  isosorbide mononitrate (IMDUR) 30 MG 24 hr tablet TAKE ONE TABLET BY MOUTH TWICE A DAY 12/24/21   Patwardhan, Manish J, MD  ketorolac (ACULAR) 0.5 % ophthalmic solution ketorolac 0.5 % eye drops    [provider]  levocetirizine (XYZAL) 5 MG tablet TAKE ONE TABLET BY MOUTH EVERY EVENING 10/22/21   Minette Brine, FNP  Menthol-Methyl Salicylate (MUSCLE RUB) 10-15 % CREA muscle rub    [provider]  methimazole (TAPAZOLE) 5 MG tablet Take 1 tablet (5 mg total) by mouth daily. 08/21/22   Shamleffer, Melanie Crazier, MD  mirabegron ER (MYRBETRIQ) 25 MG TB24 tablet Take 1 tablet (25 mg total) by mouth daily. 07/01/22   Minette Brine, FNP  Multiple Vitamins-Minerals (COMPLETE SENIOR PO) 1 tablet daily.  06/06/18   [provider]  MYRBETRIQ 25 MG TB24 tablet TAKE ONE TABLET BY MOUTH DAILY 07/01/22   Minette Brine, FNP  tamsulosin (FLOMAX) 0.4 MG CAPS capsule TAKE ONE CAPSULE BY MOUTH DAILY HALF HOUR FOLLOWING THE SAME MEAL DAILY 11/13/21   Minette Brine, FNP  vitamin B-12 (CYANOCOBALAMIN) 500 MCG tablet Take 500 mcg by mouth daily.    [provider]   vitamin C (ASCORBIC ACID) 500 MG tablet Take 500 mg daily by mouth.    [provider]      Allergies    Shellfish allergy    Review of Systems   Review of Systems  Physical Exam Updated Vital Signs BP (!) 148/89   Pulse (!) 52   Temp 98.1 F (36.7 C) (Oral)   Resp 18   Ht 6' (1.829 m)   Wt 106.6 kg   SpO2 99%   BMI 31.87 kg/m  Physical Exam Vitals and nursing note reviewed.  Constitutional:      General: He is not in acute distress.    Appearance: Normal appearance.  HENT:     Head: Normocephalic and atraumatic.     Nose: Nose normal.     Mouth/Throat:     Mouth: Mucous membranes are moist.     Pharynx: Oropharynx is clear. No oropharyngeal exudate or posterior oropharyngeal erythema.  Eyes:     Extraocular Movements: Extraocular movements intact.     Conjunctiva/sclera: Conjunctivae normal.     Pupils: Pupils are equal, round, and reactive to light.  Cardiovascular:     Rate and Rhythm: Normal rate and regular rhythm.     Pulses: Normal pulses.     Heart sounds: Normal heart sounds.  Pulmonary:     Effort: Pulmonary effort is normal.     Breath sounds: Normal breath sounds.  Abdominal:     General: Abdomen is flat.     Palpations: Abdomen is soft.     Tenderness: There is no abdominal tenderness.  Musculoskeletal:     Cervical back: Normal range of motion and neck supple.     Right lower leg: No edema.     Left lower leg: No edema.     Comments: No edema to bilateral upper extremities  Skin:    General: Skin is warm and dry.     Findings: No erythema.  Neurological:     General: No focal deficit present.     Mental Status: He is alert and oriented to person, place, and time.  Psychiatric:        Mood and Affect: Mood normal.        Behavior: Behavior normal.     ED Results / Procedures / Treatments   Labs (all labs ordered are listed, but only abnormal results are displayed) Labs Reviewed  COMPREHENSIVE METABOLIC PANEL - Abnormal;  Notable for the following components:      Result Value   Glucose, Bld 103 (*)    BUN 27 (*)    Creatinine, Ser 1.36 (*)    GFR, Estimated 50 (*)    All other components within normal limits  CBC WITH DIFFERENTIAL/PLATELET - Abnormal; Notable for the following components:   RBC 3.86 (*)    Hemoglobin 12.0 (*)    HCT 37.5 (*)    All other components  within normal limits  PROTIME-INR  APTT    EKG None  Radiology UE VENOUS DUPLEX (7am - 7pm)  Result Date: 09/05/2022 UPPER VENOUS STUDY  Patient Name:  Jesus Lam  Date of Exam:   09/05/2022 Medical Rec #: [id number]        Accession #:    [id number] Date of Birth: 1933-04-24         Patient Gender: M Patient Age:   44 years Exam Location:  [facility] Procedure:      VAS [place] UPPER EXTREMITY VENOUS DUPLEX Referring Phys: Leanord Asal --------------------------------------------------------------------------------  Indications: Thrombus Risk Factors: None identified. Comparison Study: No prior studies. Performing Technologist: Oliver Hum RVT  Examination Guidelines: A complete evaluation includes B-mode imaging, spectral Doppler, color Doppler, and power Doppler as needed of all accessible portions of each vessel. Bilateral testing is considered an integral part of a complete examination. Limited examinations for reoccurring indications may be performed as noted.  Right Findings: +----------+------------+---------+-----------+----------+-------+ RIGHT     CompressiblePhasicitySpontaneousPropertiesSummary +----------+------------+---------+-----------+----------+-------+ IJV         Partial      No        Yes               Acute  +----------+------------+---------+-----------+----------+-------+ Subclavian    Full       Yes       Yes                      +----------+------------+---------+-----------+----------+-------+ Axillary      Full       Yes       Yes                       +----------+------------+---------+-----------+----------+-------+ Brachial      Full       Yes       Yes                      +----------+------------+---------+-----------+----------+-------+ Radial        Full                                          +----------+------------+---------+-----------+----------+-------+ Ulnar         Full                                          +----------+------------+---------+-----------+----------+-------+ Cephalic      Full                                          +----------+------------+---------+-----------+----------+-------+ Basilic       Full                                          +----------+------------+---------+-----------+----------+-------+  Left Findings: +----------+------------+---------+-----------+----------+-------+ LEFT      CompressiblePhasicitySpontaneousPropertiesSummary +----------+------------+---------+-----------+----------+-------+ Subclavian    Full       Yes       Yes                      +----------+------------+---------+-----------+----------+-------+  Summary:  Right: No evidence of superficial vein thrombosis in the upper extremity. Findings consistent with acute deep vein thrombosis involving the right internal jugular vein.  Left: No evidence of thrombosis in the subclavian.  *See table(s) above for measurements and observations.    Preliminary    CT Soft Tissue Neck W Contrast  Result Date: 09/05/2022 CLINICAL DATA:  Thyroid nodule IJ Thrombus on thyroid ultrasoudn EXAM: CT NECK WITH CONTRAST TECHNIQUE: Multidetector CT imaging of the neck was performed using the standard protocol following the bolus administration of intravenous contrast. RADIATION DOSE REDUCTION: This exam was performed according to the departmental dose-optimization program which includes automated exposure control, adjustment of the mA and/or kV according to patient size and/or use of iterative reconstruction  technique. CONTRAST:  28mL OMNIPAQUE IOHEXOL 300 MG/ML  SOLN COMPARISON:  Thyroid ultrasound September 03, 2022. FINDINGS: Pharynx and larynx: Normal. No mass or swelling. Salivary glands: No inflammation, mass, or stone. Thyroid: Enlarged thyroid with many thyroid nodules, better characterized on recent thyroid ultrasound. The enlarged thyroid narrows the trachea at the thoracic inlet. Lymph nodes: None enlarged or abnormal density. Vascular: Nonocclusive thrombus within the lower right internal jugular vein. Limited intracranial: No acute findings.  Remote infarct. Visualized orbits: Negative. Mastoids and visualized paranasal sinuses: Clear. Skeleton: Severe degenerative change in the lower cervical spine. Upper chest: Visualized lung apices are clear. IMPRESSION: 1. Nonocclusive thrombus within the lower right internal jugular vein. A right upper extremity DVT ultrasound could further evaluate for thrombus within other vessels. 2. Enlarged thyroid with many thyroid nodules, better characterized on recent thyroid ultrasound. The enlarged thyroid narrows the trachea at the thoracic inlet. Electronically Signed   By: Margaretha Sheffield M.D.   On: 09/05/2022 13:50    Procedures Procedures    Medications Ordered in ED Medications  apixaban (ELIQUIS) tablet 10 mg (10 mg Oral Given 09/05/22 1711)    Followed by  apixaban (ELIQUIS) tablet 5 mg (has no administration in time range)  iohexol (OMNIPAQUE) 300 MG/ML solution 75 mL (75 mLs Intravenous Contrast Given 09/05/22 1322)    ED Course/ Medical Decision Making/ A&P                           Medical Decision Making This patient presents to the ED with chief complaint(s) of right IJ thrombus on outpatient thyroid ultrasound with pertinent past medical history of pretension, CVA, prior DVT no longer on anticoagulation which further complicates the presenting complaint. The complaint involves an extensive differential diagnosis and also carries with it a  high risk of complications and morbidity.    The differential diagnosis includes patient has had no recent infectious symptoms making Mnire's unlikely, he is no upper extremity swelling making an upper extremity DVT less likely, no evidence of any recent procedures or lines in his IJ as a cause of the thrombus, no evidence of new focal neurologic deficits making acute CVA unlikely, no chest pain or shortness of breath making acute PE unlikely  Additional history obtained: Additional history obtained from family Records reviewed Primary Care Documents and recent thyroid ultrasound  ED Course and Reassessment: Patient has had thyroid ultrasound was reviewed by myself that showed a right IJ thrombus.  I spoke with radiology who recommended CT neck with contrast to evaluate for extensive thrombus.  He will have labs performed to evaluate for coagulopathy.  Independent labs interpretation:  The following labs were independently interpreted: Within normal range  Independent visualization of  imaging: - I independently visualized the following imaging with scope of interpretation limited to determining acute life threatening conditions related to emergency care: CT neck, right upper extremity ultrasound, which revealed nonocclusive right IJ thrombus, no DVT in right upper extremity  Consultation: - Consulted or discussed management/test interpretation w/ external professional: Vascular recommends 3 months of anticoagulation  Consideration for admission or further workup: Patient is stable for discharge home on Eliquis and was given primary care follow-up.  He was given strict return precautions. Social Determinants of health: N/A    Amount and/or Complexity of Data Reviewed Labs: ordered. Radiology: ordered.  Risk Prescription drug management.          Final Clinical Impression(s) / ED Diagnoses Final diagnoses:  Thrombus    Rx / DC Orders ED Discharge Orders           Ordered    APIXABAN (ELIQUIS) VTE STARTER PACK (10MG  AND 5MG )        09/05/22 1649              Kemper Durie, DO 09/05/22 1725

## 2022-09-05 NOTE — ED Triage Notes (Signed)
Patient's daughter reports that her father was notified to come to the ED for a blood clot in his neck.

## 2022-09-05 NOTE — Telephone Encounter (Signed)
Patient advised and will contact his daughter and give me a call back and let me know which emergency room he will be going too.

## 2022-09-05 NOTE — ED Notes (Signed)
Pt denies SOB and chest pain at this time.

## 2022-09-05 NOTE — Discharge Instructions (Addendum)
You were seen in the emergency department for the blood clot seen in your neck on your outpatient ultrasound.  Your work-up here shows a clot only in one vein in your neck and it does not extend into your arm.  I did speak with the vascular surgeon who recommend that you be on anticoagulation for the next 3 months.  We have given you a starter pack for Eliquis and you should follow-up with your primary doctor for further prescriptions and for reassessment of your symptoms.  You should return to the emergency department if you had worsening shortness of breath, you fall and hit your head, you have numbness or weakness on one side of your body compared to the other or if you have any other new or concerning symptoms.  ___________________________________________________________________________________________________________  Information on my medicine - ELIQUIS (apixaban)    Why was Eliquis prescribed for you? Eliquis was prescribed to treat blood clots that may have been found in the veins of your legs (deep vein thrombosis) or in your lungs (pulmonary embolism) and to reduce the risk of them occurring again.  What do You need to know about Eliquis ? The starting dose is 10 mg (two 5 mg tablets) taken TWICE daily for the FIRST SEVEN (7) DAYS, then on 09/12/2022 the dose is reduced to ONE 5 mg tablet taken TWICE daily.  Eliquis may be taken with or without food.   Try to take the dose about the same time in the morning and in the evening. If you have difficulty swallowing the tablet whole please discuss with your pharmacist how to take the medication safely.  Take Eliquis exactly as prescribed and DO NOT stop taking Eliquis without talking to the doctor who prescribed the medication.  Stopping may increase your risk of developing a new blood clot.  Refill your prescription before you run out.  After discharge, you should have regular check-up appointments with your healthcare provider that is  prescribing your Eliquis.    What do you do if you miss a dose? If a dose of ELIQUIS is not taken at the scheduled time, take it as soon as possible on the same day and twice-daily administration should be resumed. The dose should not be doubled to make up for a missed dose.  Important Safety Information A possible side effect of Eliquis is bleeding. You should call your healthcare provider right away if you experience any of the following: Bleeding from an injury or your nose that does not stop. Unusual colored urine (red or dark brown) or unusual colored stools (red or black). Unusual bruising for unknown reasons. A serious fall or if you hit your head (even if there is no bleeding).  Some medicines may interact with Eliquis and might increase your risk of bleeding or clotting while on Eliquis. To help avoid this, consult your healthcare provider or pharmacist prior to using any new prescription or non-prescription medications, including herbals, vitamins, non-steroidal anti-inflammatory drugs (NSAIDs) and supplements.  This website has more information on Eliquis (apixaban): http://www.eliquis.com/eliquis/home

## 2022-09-05 NOTE — ED Notes (Addendum)
An After Visit Summary was printed and given to the patient. Discharge instructions given and no further questions at this time.  Pt states his daughter is taking him home. Pt A&Ox4, ambulatory with cane.

## 2022-09-05 NOTE — Telephone Encounter (Signed)
Spoke with Amparo Bristol the Triage nurse at [facility] ED advise that patient would be coming over for evaluation for blood clot in right side of neck.

## 2022-09-06 ENCOUNTER — Telehealth: Payer: Self-pay

## 2022-09-09 ENCOUNTER — Ambulatory Visit: Payer: Medicare HMO | Admitting: Podiatry

## 2022-09-09 ENCOUNTER — Other Ambulatory Visit: Payer: Self-pay | Admitting: Nurse Practitioner

## 2022-09-09 DIAGNOSIS — R350 Frequency of micturition: Secondary | ICD-10-CM

## 2022-09-09 DIAGNOSIS — R351 Nocturia: Secondary | ICD-10-CM

## 2022-09-10 NOTE — Patient Instructions (Signed)
Visit Information It was great speaking with you today!  Please let me know if you have any questions about our visit.   Goals Addressed             This Visit's Progress    Manage My Medicine       Timeframe:  Long-Range Goal Priority:  High Start Date:                             Expected End Date:                       Follow Up Date 10/16/2022   In Progress:   - call for medicine refill 2 or 3 days before it runs out - call if I am sick and can't take my medicine - keep a list of all the medicines I take; vitamins and herbals too - use an alarm clock or phone to remind me to take my medicine    Why is this important?   These steps will help you keep on track with your medicines.   Notes:  Please call the pharmacy team with any questions you may have.      Track and Manage My Blood Pressure-Hypertension       Timeframe:  Long-Range Goal Priority:  High Start Date:                             Expected End Date:                       Follow Up Date 10/16/2022   In Progress: - check blood pressure daily - write blood pressure results in a log or diary    Why is this important?   You won't feel high blood pressure, but it can still hurt your blood vessels.  High blood pressure can cause heart or kidney problems. It can also cause a stroke.  Making lifestyle changes like losing a little weight or eating less salt will help.  Checking your blood pressure at home and at different times of the day can help to control blood pressure.  If the doctor prescribes medicine remember to take it the way the doctor ordered.  Call the office if you cannot afford the medicine or if there are questions about it.     Notes:  Please call if you have any questions         Patient Care Plan: CCM Pharmacy Care Plan     Problem Identified: HTN, PAD   Priority: High     Long-Range Goal: Disease Management   Recent Progress: On track  Note:    Current Barriers:  Unable to  independently monitor therapeutic efficacy  Pharmacist Clinical Goal(s):  Patient will achieve adherence to monitoring guidelines and medication adherence to achieve therapeutic efficacy through collaboration with PharmD and provider.   Interventions: 1:1 collaboration with Minette Brine, FNP regarding development and update of comprehensive plan of care as evidenced by provider attestation and co-signature Inter-disciplinary care team collaboration (see longitudinal plan of care) Comprehensive medication review performed; medication list updated in electronic medical record  Hypertension (BP goal <130/80) -Controlled -Current treatment: Hydralazine 100 mg tablet take 1 tablet by mouth three times daily Appropriate, Effective, Safe, Accessible Amlodipine 5 mg tablet once per day Appropriate, Effective, Safe, Accessible Isosorbide mononitrate 30 mg tablet three  times per day Appropriate, Effective, Safe, Accessible Bisoprolol 5 mg tablet take 1/2 tablet by mouth daily Appropriate, Effective, Safe, Accessible Chlorthalidone 25 mg tablet once per day Appropriate, Effective, Safe, Accessible -Current home readings: Patient is checking his BP at home sometimes daughter reports that on Monday he seemed dizzy and she is concerned that it was because of a low BP reading - 09/04/2022 124/58 pulse 56 -Current exercise habits: patient reports that he is exercising.  -Reports hypotensive/hypertensive symptoms -Educated on BP goals and benefits of medications for prevention of heart attack, stroke and kidney damage; Importance of home blood pressure monitoring; Proper BP monitoring technique; Symptoms of hypotension and importance of maintaining adequate hydration; -Counseled to monitor BP at home at least once per day, document, and provide log at future appointment -Patient reports that he is drinking 6-4 bottles 12 ounce bottles of water per day.  -Collaborated with patients daughter Rip Harbour the  youngest, and she is going to write down his BP readings and give them to the [facility] weekly.  -Patient reports checking BP daily and it going to cardiology daily and they call him when there is an issue. He reports he was called two weeks ago  -Spoke with pharmacist from cardiology Elmo Putt, who reports that the BP readings have been normal and his BP is optimized -Collaborated with PCP team to determine when patient should be seen for a visit. Start checking  BP using BLIP care machine as given by cardiology to make sure readings are received by cardiology team     Peripheral Arterial Disease: (LDL goal < 70) -Controlled -Current treatment: Atorvastatin 40 mg tablet once per day Appropriate, Effective, Safe, Accessible  -Current dietary patterns: will discuss during next office visit -Educated on Cholesterol goals;  Benefits of statin for ASCVD risk reduction; -Recommended to continue current medication  Patient Goals/Self-Care Activities Patient will:  - take medications as prescribed as evidenced by patient report and record review  Follow Up Plan: The patient has been provided with contact information for the care management team and has been advised to call with any health related questions or concerns.        Patient agreed to services and verbal consent obtained.   The patient verbalized understanding of instructions, educational materials, and care plan provided today and agreed to receive a mailed copy of patient instructions, educational materials, and care plan.   [name], PharmD Clinical Pharmacist [facility] [phone]

## 2022-09-11 ENCOUNTER — Encounter: Payer: Medicare HMO | Admitting: Nurse Practitioner

## 2022-09-18 ENCOUNTER — Encounter: Payer: Self-pay | Admitting: Podiatry

## 2022-09-18 ENCOUNTER — Ambulatory Visit (INDEPENDENT_AMBULATORY_CARE_PROVIDER_SITE_OTHER): Payer: Medicare HMO | Admitting: Podiatry

## 2022-09-18 DIAGNOSIS — M21622 Bunionette of left foot: Secondary | ICD-10-CM

## 2022-09-18 DIAGNOSIS — M21621 Bunionette of right foot: Secondary | ICD-10-CM

## 2022-09-18 DIAGNOSIS — I999 Unspecified disorder of circulatory system: Secondary | ICD-10-CM | POA: Diagnosis not present

## 2022-09-18 DIAGNOSIS — L84 Corns and callosities: Secondary | ICD-10-CM

## 2022-09-18 DIAGNOSIS — I798 Other disorders of arteries, arterioles and capillaries in diseases classified elsewhere: Secondary | ICD-10-CM | POA: Diagnosis not present

## 2022-09-18 NOTE — Progress Notes (Signed)
Patient presents with subjective:   Patient ID: Jesus Lam, male   DOB: 88 y.o.   MRN: [id number]   HPI Full calluses on the bottom of both feet that are hard for him to walk on and does have vascular issues sees a vascular doctor.  Patient does not smoke and is not active   Review of Systems  All other systems reviewed and are negative.       Objective:  Physical Exam Vitals and nursing note reviewed.  Constitutional:      Appearance: He is well-developed.  Pulmonary:     Effort: Pulmonary effort is normal.  Musculoskeletal:        General: Normal range of motion.  Skin:    General: Skin is warm.  Neurological:     Mental Status: He is alert.     Pulses PT and DP are reduced bilateral with patient under the care of a vascular surgeon who is watching him with neurological also diminished sharp dull vibratory.  Range of motion is somewhat diminished subtalar joint muscle strength diminished also due to advanced age with severe keratotic lesions of the first and fifth metatarsal both feet that become painful     Assessment:  Vascular disease at risk with lesion formation bilateral painful     Plan:  H&P reviewed condition sharp sterile debridement of lesions bilateral no iatrogenic bleeding reappoint routine care cushioned shoes to be worn and everything discussed along with his circulatory status

## 2022-09-23 ENCOUNTER — Other Ambulatory Visit: Payer: Self-pay | Admitting: Nurse Practitioner

## 2022-09-24 DIAGNOSIS — I10 Essential (primary) hypertension: Secondary | ICD-10-CM

## 2022-09-26 ENCOUNTER — Ambulatory Visit
Admission: RE | Admit: 2022-09-26 | Discharge: 2022-09-26 | Disposition: A | Discharge status: Home / Self Care | Payer: Medicare HMO | Source: Ambulatory Visit | Attending: Family Medicine | Admitting: Family Medicine

## 2022-09-26 ENCOUNTER — Other Ambulatory Visit: Payer: Self-pay | Admitting: Family Medicine

## 2022-09-26 DIAGNOSIS — M1712 Unilateral primary osteoarthritis, left knee: Secondary | ICD-10-CM

## 2022-10-07 ENCOUNTER — Other Ambulatory Visit: Payer: Self-pay | Admitting: Nurse Practitioner

## 2022-10-07 DIAGNOSIS — R350 Frequency of micturition: Secondary | ICD-10-CM

## 2022-10-07 DIAGNOSIS — R351 Nocturia: Secondary | ICD-10-CM

## 2022-10-10 ENCOUNTER — Ambulatory Visit: Payer: Medicare HMO | Admitting: Nurse Practitioner

## 2022-10-14 ENCOUNTER — Telehealth: Payer: Self-pay

## 2022-10-14 NOTE — Chronic Care Management (AMB) (Signed)
Contacted patient for appointment reminder. Patient is no longer with [facility]. Patient stated new PCP is Dr. Roswell Miners. Patient isn't sure what the facility is called. Updated team.  Huey Romans CMA Clinical Pharmacist Assistant [phone]

## 2022-10-16 ENCOUNTER — Telehealth: Payer: Self-pay

## 2022-10-23 ENCOUNTER — Ambulatory Visit: Payer: Self-pay | Admitting: Cardiology

## 2022-10-23 ENCOUNTER — Telehealth: Payer: Self-pay

## 2022-10-23 NOTE — Progress Notes (Signed)
Error

## 2022-10-23 NOTE — Telephone Encounter (Signed)
Patient's home BP has been fairly controlled. BP does tend to fluctuate.   Average Systolic BP Level 141.37 mmHg Lowest Systolic BP Level 110 mmHg Highest Systolic BP Level 154 mmHg  10/22/2022 Tuesday at 09:44 AM 147 / 80      10/20/2022 Sunday at 11:22 PM 143 / 83      10/19/2022 Saturday at 06:34 AM 150 / 80      10/19/2022 Saturday at 06:32 AM 153 / 84      10/18/2022 Friday at 03:55 PM 110 / 67      10/17/2022 Thursday at 02:31 PM 117 / 63      10/16/2022 Wednesday at 03:05 PM 139 / 72      10/15/2022 Tuesday at 01:15 PM 126 / 65      10/15/2022 Tuesday at 01:13 PM 147 / 80      11 /19/2023 Sunday at 02:27 PM 139 / 70

## 2022-10-25 ENCOUNTER — Ambulatory Visit: Payer: Self-pay

## 2022-10-25 DIAGNOSIS — E782 Mixed hyperlipidemia: Secondary | ICD-10-CM

## 2022-10-25 DIAGNOSIS — I10 Essential (primary) hypertension: Secondary | ICD-10-CM

## 2022-10-25 NOTE — Patient Outreach (Signed)
  Care Coordination   Follow Up Visit Note   10/25/2022 Name: Jesus Lam MRN: [id number] DOB: January 16, 1933  Jesus Lam is a 88 y.o. year old male who sees [facility] for primary care. I spoke with  Jesus Lam by phone today.  What matters to the patients health and wellness today?  Transportation to appointment on 12/6    Goals Addressed             This Visit's Progress    COMPLETED: Care Coordination Activities       Care Coordination Interventions: Discussed the patient has an appointment on 12/6 with no transportation Education provided on the patients [employer] transportation benefit - patient indicates he is aware of this benefit and has used it in the past Assisted the patient in contacting his health plan to arrange transportation. Patient will be picked up at 11:30 and transported to 12/6 visit. He will need to contact [employer] at [phone] for his return trip home. Reservation ID #: [id number] I have provided this information to the patient as well as the provider the patient is seeing in case the patient forgets No further follow up planned; patient indicates he has switched health care providers and is now being followed by Roswell Miners with [facility] which is not within [employer] with RN Care Manager to advise on above information         SDOH assessments and interventions completed:  No     Care Coordination Interventions:  Yes, provided   Follow up plan: No further intervention required.   Encounter Outcome:  Pt. Visit Completed   Bevelyn Ngo, BSW, CDP Social Worker, Certified Dementia Practitioner [facility] Care Management  Care Coordination [phone]

## 2022-10-25 NOTE — Patient Instructions (Signed)
Visit Information  Thank you for taking time to visit with me today. Please don't hesitate to contact me if I can be of assistance to you.   Following are the goals we discussed today:   Goals Addressed             This Visit's Progress    COMPLETED: Care Coordination Activities       Care Coordination Interventions: Discussed the patient has an appointment on 12/6 with no transportation Education provided on the patients [employer] transportation benefit - patient indicates he is aware of this benefit and has used it in the past Assisted the patient in contacting his health plan to arrange transportation. Patient will be picked up at 11:30 and transported to 12/6 visit. He will need to contact [employer] at [phone] for his return trip home. Reservation ID #: [phone] [phone] I have provided this information to the patient as well as the provider the patient is seeing in case the patient forgets No further follow up planned; patient indicates he has switched health care providers and is now being followed by Roswell Miners with [facility] which is not within [employer] with RN Care Manager to advise on above information         If you are experiencing a Mental Health or Behavioral Health Crisis or need someone to talk to, please call 1-800-273-TALK (toll free, 24 hour hotline)  The patient verbalized understanding of instructions, educational materials, and care plan provided today and DECLINED offer to receive copy of patient instructions, educational materials, and care plan.   No further follow up required: Please contact you primary care provider as needed

## 2022-10-25 NOTE — Patient Outreach (Signed)
  Care Coordination   Follow Up Visit Note   10/25/2022 Name: Jesus Lam MRN: [id number] DOB: [birth year]  Jesus Lam is a 88 y.o. year old male who sees [facility] Provider Not In for primary care. I spoke with  Jesus Lam by phone today.  What matters to the patients health and wellness today?  Patient's daughter will reschedule missed MD appointments and ensure patient is taking the correct medications as prescribed.      Goals Addressed               This Visit's Progress     Patient Stated     COMPLETED: I have restarted taking my thyroid medication (pt-stated)        Care Coordination Interventions: Evaluation of current treatment plan related to hyperthyroidism and patient's adherence to plan as established by provider Determined patient completed recent follow up with Endocrinologist, Dr. Lonzo Cloud Review of patient status, including review of consultant's reports, relevant laboratory and other test results Reviewed medications with patient and discussed importance of medication adherence Instructed patient to report new symptoms or concerns to his thyroid doctor promptly Reviewed scheduled/upcoming provider appointments          I missed some appointments (pt-stated)        Care Coordination Interventions: Patient interviewed about adult health maintenance status including  the importance of keeping all scheduled MD appointments in order to receive the treatment necessary to maintain good health Determined patient is having transportation barriers Placed [id number] referral marked [employer] to assist with transportation for upcoming scheduled appointments Placed outbound call to daughter Jesus Lam to review recently missed appointments, reviewed upcoming scheduled appointments Provided daughter Jesus Lam with the contact name/number for Cardiologist in order to rescheduled missed appointment from 10/23/22         Other     Acute Thrombus to IJ        Care  Coordination Interventions: Evaluation of current treatment plan related to thrombus to IJ and patient's adherence to plan as established by provider Reviewed and discussed IP admission on 09/05/22 for evaluation of syncope episode and incidental finding of thrombus to IJ Review of patient status, including review of consultant's reports, relevant laboratory and other test results, and medications completed Determined patient is under the impression he has completed course of Apixaban Placed successful outbound call to daughter Jesus Lam to review MD recommendations for 3 month use of Apixaban to treat thrombus Determined daughter Jesus Lam will visit her father today to confirm he is taking this medication exactly as prescribed            SDOH assessments and interventions completed:  Yes  SDOH Interventions Today    Flowsheet Row Most Recent Value  SDOH Interventions   Transportation Interventions AMB Referral        Care Coordination Interventions:  Yes, provided   Follow up plan: Follow up call scheduled for 11/26/22 @1230  PM    Encounter Outcome:  Pt. Visit Completed

## 2022-10-25 NOTE — Patient Instructions (Signed)
Visit Information  Thank you for taking time to visit with me today. Please don't hesitate to contact me if I can be of assistance to you.   Following are the goals we discussed today:   Goals Addressed               This Visit's Progress     Patient Stated     COMPLETED: I have restarted taking my thyroid medication (pt-stated)        Care Coordination Interventions: Evaluation of current treatment plan related to hyperthyroidism and patient's adherence to plan as established by provider Determined patient completed recent follow up with Endocrinologist, Dr. Lonzo Cloud Review of patient status, including review of consultant's reports, relevant laboratory and other test results Reviewed medications with patient and discussed importance of medication adherence Instructed patient to report new symptoms or concerns to his thyroid doctor promptly Reviewed scheduled/upcoming provider appointments          I missed some appointments (pt-stated)        Care Coordination Interventions: Patient interviewed about adult health maintenance status including  the importance of keeping all scheduled MD appointments in order to receive the treatment necessary to maintain good health Determined patient is having transportation barriers Placed [id number] referral marked Emergent to assist with transportation for upcoming scheduled appointments Placed outbound call to daughter [name] to review recently missed appointments, reviewed upcoming scheduled appointments Provided daughter [name] with the contact name/number for Cardiologist in order to rescheduled missed appointment from 10/23/22         Other     Acute Thrombus to IJ        Care Coordination Interventions: Evaluation of current treatment plan related to thrombus to IJ and patient's adherence to plan as established by provider Reviewed and discussed IP admission on 09/05/22 for evaluation of syncope episode and incidental finding of  thrombus to IJ Review of patient status, including review of consultant's reports, relevant laboratory and other test results, and medications completed Determined patient is under the impression he has completed course of Apixaban Placed successful outbound call to daughter [name] to review MD recommendations for 3 month use of Apixaban to treat thrombus Determined daughter [name] will visit her father today to confirm he is taking this medication exactly as prescribed            Our next appointment is by telephone on 11/26/22 at 1230 PM  Please call the care guide team at [phone] if you need to cancel or reschedule your appointment.   If you are experiencing a Mental Health or Behavioral Health Crisis or need someone to talk to, please call 1-800-273-TALK (toll free, 24 hour hotline)  The patient verbalized understanding of instructions, educational materials, and care plan provided today and agreed to receive a mailed copy of patient instructions, educational materials, and care plan.   [name], RN, BSN, CCM Care Management Coordinator [facility] Care Management  Direct Phone: [phone]

## 2022-10-29 ENCOUNTER — Encounter: Payer: Self-pay | Admitting: Orthopaedic Surgery

## 2022-10-29 ENCOUNTER — Ambulatory Visit: Payer: Medicare HMO | Admitting: Orthopaedic Surgery

## 2022-10-29 ENCOUNTER — Ambulatory Visit (INDEPENDENT_AMBULATORY_CARE_PROVIDER_SITE_OTHER): Payer: Medicare HMO

## 2022-10-29 ENCOUNTER — Telehealth: Payer: Self-pay

## 2022-10-29 DIAGNOSIS — M25562 Pain in left knee: Secondary | ICD-10-CM

## 2022-10-29 DIAGNOSIS — G8929 Other chronic pain: Secondary | ICD-10-CM

## 2022-10-29 DIAGNOSIS — M1712 Unilateral primary osteoarthritis, left knee: Secondary | ICD-10-CM

## 2022-10-29 NOTE — Telephone Encounter (Signed)
VOB submitted for Monovisc, left knee 

## 2022-10-29 NOTE — Progress Notes (Signed)
Office Visit Note   Patient: Jesus Lam           Date of Birth: [birth year]           MRN: [id number] Visit Date: 10/29/2022              Requested by: No referring provider defined for this encounter. PCP: [facility] Not In   Assessment & Plan: Visit Diagnoses:  1. Primary osteoarthritis of left knee     Plan: Impression is severe bone-on-bone tricompartmental osteoarthritis with varus deformity.  This is a difficult situation.  Patient does have relatively advanced age for knee replacement surgery however he is having severe pain and conservative treatments are no longer effective.  He was just diagnosed with a right internal jugular DVT and currently on Eliquis and Plavix.  Not sure if he would be allowed to stop anticoagulation this soon.  We will check with his doctors.  In the meantime we will get approval for viscosupplementation injections in case he is currently not a surgical candidate.  Ultimately he understands that a knee replacement will be the most reliable treatment option to provide long-term pain relief.  Follow-Up Instructions: No follow-ups on file.   Orders:  Orders Placed This Encounter  Procedures   XR KNEE 3 VIEW LEFT   No orders of the defined types were placed in this encounter.     Procedures: No procedures performed   Clinical Data: No additional findings.   Subjective: Chief Complaint  Patient presents with   Left Knee - Pain    HPI Mr. Doten is a very pleasant 88 year old gentleman here for evaluation of chronic severe left knee pain for 20 years.  Has constant pain throughout the knee.  This significantly interferes with daily activities and quality of life.  He has had cortisone injections and physical therapy in the past without significant or long-lasting relief.  Most recent cortisone injection was about a month and a half ago at his PCPs office per patient report.  He had a stroke about 2 years ago.   Review of Systems   Constitutional: Negative.   HENT: Negative.    Eyes: Negative.   Respiratory: Negative.    Cardiovascular: Negative.   Gastrointestinal: Negative.   Endocrine: Negative.   Genitourinary: Negative.   Skin: Negative.   Allergic/Immunologic: Negative.   Neurological: Negative.   Hematological: Negative.   Psychiatric/Behavioral: Negative.    All other systems reviewed and are negative.    Objective: Vital Signs: There were no vitals taken for this visit.  Physical Exam Vitals and nursing note reviewed.  Constitutional:      Appearance: He is well-developed.  HENT:     Head: Normocephalic and atraumatic.  Eyes:     Pupils: Pupils are equal, round, and reactive to light.  Pulmonary:     Effort: Pulmonary effort is normal.  Abdominal:     Palpations: Abdomen is soft.  Musculoskeletal:        General: Normal range of motion.     Cervical back: Neck supple.  Skin:    General: Skin is warm.  Neurological:     Mental Status: He is alert and oriented to person, place, and time.  Psychiatric:        Behavior: Behavior normal.        Thought Content: Thought content normal.        Judgment: Judgment normal.     Ortho Exam Examination of left knee shows a varus deformity.  Medial joint line tenderness with significant crepitus.  Collaterals and cruciates are stable.  Pain throughout arc of motion. Specialty Comments:  No specialty comments available.  Imaging: XR KNEE 3 VIEW LEFT  Result Date: 10/29/2022 Three-view x-rays show severe tricompartmental osteoarthritis.  Medial compartment is bone-on-bone with varus deformity.    PMFS History: Patient Active Problem List   Diagnosis Date Noted   Leg edema 08/01/2021   Mild cognitive impairment 09/07/2020   Chronic pain of left knee 03/15/2020   Pure hypercholesterolemia 11/15/2019   First degree AV block 07/28/2019   Hypertriglyceridemia 04/29/2019   Mixed hyperlipidemia 04/29/2019   CVA (cerebral vascular accident)  (HCC)    Bradycardia    Subclinical hyperthyroidism 11/18/2018   Peripheral artery disease (HCC) 06/13/2018   Ischemic cerebrovascular accident (CVA) (HCC) 06/13/2018   Dysarthria 06/12/2018   Renal insufficiency 06/12/2018   Claudication (HCC) 01/11/2018   Anemia 10/10/2017   Essential hypertension 11/04/2013   Past Medical History:  Diagnosis Date   Anemia    BPH (benign prostatic hyperplasia)    Cataracts, bilateral    CVA (cerebral vascular accident) (HCC)    DVT (deep venous thrombosis) (HCC)    dvt in left leg   Dyslipidemia    Gout    Gout    HTN (hypertension)    Left hip pain 03/15/2020   Left leg pain    Osteoarthritis    PAD (peripheral artery disease) (HCC)    Stasis dermatitis    Stroke (HCC)    Vitamin D deficiency     Family History  Problem Relation Age of Onset   Cancer Mother    Cancer Father    Alcohol abuse Father    Breast cancer Daughter    Stroke Daughter    CVA Daughter     Past Surgical History:  Procedure Laterality Date   ABDOMINAL AORTOGRAM N/A 01/13/2018   Procedure: ABDOMINAL AORTOGRAM;  Surgeon: Elder Negus, MD;  Location: [facility];  Service: Cardiovascular;  Laterality: N/A;   CATARACT EXTRACTION, BILATERAL     LEFT HEART CATH AND CORONARY ANGIOGRAPHY N/A 01/13/2018   Procedure: LEFT HEART CATH AND CORONARY ANGIOGRAPHY;  Surgeon: Elder Negus, MD;  Location: [facility];  Service: Cardiovascular;  Laterality: N/A;   LOWER EXTREMITY ANGIOGRAPHY N/A 01/13/2018   Procedure: LOWER EXTREMITY ANGIOGRAPHY;  Surgeon: Elder Negus, MD;  Location: [facility];  Service: Cardiovascular;  Laterality: N/A;   LOWER EXTREMITY ANGIOGRAPHY Right 01/27/2018   Procedure: LOWER EXTREMITY ANGIOGRAPHY;  Surgeon: Yates Decamp, MD;  Location: [facility];  Service: Cardiovascular;  Laterality: Right;   PERIPHERAL VASCULAR ATHERECTOMY Left 01/13/2018   Procedure: PERIPHERAL VASCULAR ATHERECTOMY;  Surgeon:  Elder Negus, MD;  Location: [facility];  Service: Cardiovascular;  Laterality: Left;  SFA WITH PTA DRUG COATED BALLOON   PERIPHERAL VASCULAR INTERVENTION  01/27/2018   Procedure: PERIPHERAL VASCULAR INTERVENTION;  Surgeon: Yates Decamp, MD;  Location: [facility];  Service: Cardiovascular;;   Social History   Occupational History   Occupation: retired  Tobacco Use   Smoking status: Former    Packs/day: 0.25    Years: 0.50    Total pack years: 0.13    Types: Cigarettes    Quit date: 1980    Years since quitting: 43.9   Smokeless tobacco: Never  Vaping Use   Vaping Use: Never used  Substance and Sexual Activity   Alcohol use: Yes    Alcohol/week:  6.0 - 7.0 standard drinks of alcohol    Types: 6 - 7 Shots of liquor per week    Comment: 6-7 drinks per week   Drug use: Yes    Types: Marijuana    Comment: last used x 2 hours ago   Sexual activity: Yes

## 2022-10-29 NOTE — Telephone Encounter (Signed)
Left knee visco approval. Dr.Xu's patient. Thanks! 

## 2022-10-30 ENCOUNTER — Ambulatory Visit: Payer: Medicare HMO | Attending: Family Medicine

## 2022-10-30 ENCOUNTER — Other Ambulatory Visit: Payer: Self-pay

## 2022-10-30 DIAGNOSIS — M25562 Pain in left knee: Secondary | ICD-10-CM | POA: Diagnosis present

## 2022-10-30 DIAGNOSIS — R600 Localized edema: Secondary | ICD-10-CM | POA: Insufficient documentation

## 2022-10-30 DIAGNOSIS — M6281 Muscle weakness (generalized): Secondary | ICD-10-CM | POA: Diagnosis present

## 2022-10-30 DIAGNOSIS — G8929 Other chronic pain: Secondary | ICD-10-CM | POA: Diagnosis present

## 2022-10-30 DIAGNOSIS — R2689 Other abnormalities of gait and mobility: Secondary | ICD-10-CM | POA: Diagnosis present

## 2022-10-30 NOTE — Therapy (Signed)
OUTPATIENT PHYSICAL THERAPY LOWER EXTREMITY EVALUATION   Patient Name: Jesus Lam MRN: [id number] DOB:1933-06-05, 88 y.o., male Today's Date: 10/30/2022  END OF SESSION:  PT End of Session - 10/30/22 1253     Visit Number 1    Number of Visits 17    Date for PT Re-Evaluation 12/25/22    Authorization Type [employer] MCR    PT Start Time 1210    PT Stop Time 1250    PT Time Calculation (min) 40 min    Activity Tolerance Patient tolerated treatment well    Behavior During Therapy WFL for tasks assessed/performed             Past Medical History:  Diagnosis Date   Anemia    BPH (benign prostatic hyperplasia)    Cataracts, bilateral    CVA (cerebral vascular accident) (HCC)    DVT (deep venous thrombosis) (HCC)    dvt in left leg   Dyslipidemia    Gout    Gout    HTN (hypertension)    Left hip pain 03/15/2020   Left leg pain    Osteoarthritis    PAD (peripheral artery disease) (HCC)    Stasis dermatitis    Stroke (HCC)    Vitamin D deficiency    Past Surgical History:  Procedure Laterality Date   ABDOMINAL AORTOGRAM N/A 01/13/2018   Procedure: ABDOMINAL AORTOGRAM;  Surgeon: Elder Negus, MD;  [facility]: [facility];  Service: Cardiovascular;  Laterality: N/A;   CATARACT EXTRACTION, BILATERAL     LEFT HEART CATH AND CORONARY ANGIOGRAPHY N/A 01/13/2018   Procedure: LEFT HEART CATH AND CORONARY ANGIOGRAPHY;  Surgeon: Elder Negus, MD;  Location: [facility];  Service: Cardiovascular;  Laterality: N/A;   LOWER EXTREMITY ANGIOGRAPHY N/A 01/13/2018   Procedure: LOWER EXTREMITY ANGIOGRAPHY;  Surgeon: Elder Negus, MD;  [facility]: [facility];  Service: Cardiovascular;  Laterality: N/A;   LOWER EXTREMITY ANGIOGRAPHY Right 01/27/2018   Procedure: LOWER EXTREMITY ANGIOGRAPHY;  Surgeon: Yates Decamp, MD;  [facility];  Service: Cardiovascular;  Laterality: Right;   PERIPHERAL VASCULAR ATHERECTOMY Left 01/13/2018    Procedure: PERIPHERAL VASCULAR ATHERECTOMY;  Surgeon: Elder Negus, MD;  [facility]: [facility];  Service: Cardiovascular;  Laterality: Left;  SFA WITH PTA DRUG COATED BALLOON   PERIPHERAL VASCULAR INTERVENTION  01/27/2018   Procedure: PERIPHERAL VASCULAR INTERVENTION;  Surgeon: Yates Decamp, MD;  [facility]: [facility];  Service: Cardiovascular;;   Patient Active Problem List   Diagnosis Date Noted   Leg edema 08/01/2021   Mild cognitive impairment 09/07/2020   Chronic pain of left knee 03/15/2020   Pure hypercholesterolemia 11/15/2019   First degree AV block 07/28/2019   Hypertriglyceridemia 04/29/2019   Mixed hyperlipidemia 04/29/2019   CVA (cerebral vascular accident) (HCC)    Bradycardia    Subclinical hyperthyroidism 11/18/2018   Peripheral artery disease (HCC) 06/13/2018   Ischemic cerebrovascular accident (CVA) (HCC) 06/13/2018   Dysarthria 06/12/2018   Renal insufficiency 06/12/2018   Claudication (HCC) 01/11/2018   Anemia 10/10/2017   Essential hypertension 11/04/2013    PCP: Unknown  REFERRING PROVIDER: Roswell Miners, MD  REFERRING DIAG: M17.12 (ICD-10-CM) - Unilateral primary osteoarthritis, left knee   THERAPY DIAG:  Chronic pain of left knee  Muscle weakness (generalized)  Other abnormalities of gait and mobility  Localized edema  Rationale for Evaluation and Treatment: Rehabilitation  ONSET DATE: Chronic  SUBJECTIVE:   SUBJECTIVE STATEMENT: Pt presents to PT reports  of chronic L knee pain and discomfort. Denies N/T, but does have past history of CVA with residual speech deficits per report. Pt notes that he has had increasing trouble with balance and L knee pain since he can no longer go to the pool at the [facility]. Pt would like to get back to being more active with swimming and not be limited by his knee pain.  PERTINENT HISTORY: CVA, DVT, HTN PAIN:  Are you having pain?  Yes: NPRS scale: 10/10 Pain location: left medial knee Pain  description: sharp Aggravating factors: walking, stairs Relieving factors: rest, medication  PRECAUTIONS: None  WEIGHT BEARING RESTRICTIONS: No  FALLS:  Has patient fallen in last 6 months? Yes. Number of falls: one; fell to ground while   LIVING ENVIRONMENT: Lives with: lives with their family Lives in: House/apartment Stairs: Yes: External: 4 steps; none Has following equipment at home: Single point cane, Walker - 2 wheeled, Environmental consultant - 4 wheeled, and Grab bars  OCCUPATION: Retired  PLOF: Independent  PATIENT GOALS: decrease pain, get back to swimming at the [employer]  NEXT MD VISIT:   OBJECTIVE:   DIAGNOSTIC FINDINGS:   Three-view x-rays show severe tricompartmental osteoarthritis.  Medial  compartment is bone-on-bone with varus deformity.   PATIENT SURVEYS:  FOTO: 43% function; 54% predicted   COGNITION: Overall cognitive status: Within functional limits for tasks assessed     SENSATION: WFL  POSTURE: rounded shoulders and forward head  PALPATION: TTP to medial L knee, distal L quad  LOWER EXTREMITY ROM:  Active ROM Right eval Left eval  Hip flexion    Hip extension    Hip abduction    Hip adduction    Hip internal rotation    Hip external rotation    Knee flexion WFL 95  Knee extension WFL 5  Ankle dorsiflexion    Ankle plantarflexion    Ankle inversion    Ankle eversion     (Blank rows = not tested)  LOWER EXTREMITY MMT:  MMT Right eval Left eval  Hip flexion 4/5 4/5  Hip extension    Hip abduction 3+/5 3+/5  Hip adduction    Hip internal rotation    Hip external rotation    Knee flexion 4/5 4/5  Knee extension 4/5 3+/5  Ankle dorsiflexion    Ankle plantarflexion    Ankle inversion    Ankle eversion     (Blank rows = not tested)  LOWER EXTREMITY SPECIAL TESTS:  DNT  FUNCTIONAL TESTS:  30 Second Sit to Stand: 9 reps - with UE support  GAIT: Distance walked: 94ft Assistive device utilized: Single point cane Level of assistance:  Modified independence Comments: antalgic gait L, decreased L knee flexion   TREATMENT: [facility] Adult PT Treatment:                                                DATE: 10/30/2022 Therapeutic Exercise: Supine quad set x 5 - 5" hold Supine SLR x 5 L Seated clamshell x 15 BTB STS x 10   PATIENT EDUCATION:  Education details: eval findings, FOTO, HEP, POC Person educated: Patient Education method: Explanation, Demonstration, and Handouts Education comprehension: verbalized understanding and returned demonstration  HOME EXERCISE PROGRAM: Access Code: [email or web address] URL: [email or web address] Date: 10/30/2022 Prepared by: Edwinna Areola  Exercises - Supine Quadricep Sets  - 1 x daily - 7 x  weekly - 2 sets - 10 reps - 5 sec hold - Active Straight Leg Raise with Quad Set  - 1 x daily - 7 x weekly - 2 sets - 10 reps - Seated Hip Abduction with Resistance  - 1 x daily - 7 x weekly - 3 sets - 10 reps - blue theraband hold - Sit to Stand  - 1 x daily - 7 x weekly - 2 sets - 10 reps  ASSESSMENT:  CLINICAL IMPRESSION: Patient is a 88 y.o. m who was seen today for physical therapy evaluation and treatment for chronic L knee pain and discomfort. Physical findings are consistent with MD impression as pt demonstrates decrease LE strength, L knee ROM, and functional mobility. His FOTO score demonstrates decrease in functional ability below PLOF. Pt would benefit from skilled PT services working on improving LE strength and functional mobility in order to decrease pain and improve comfort.   OBJECTIVE IMPAIRMENTS: Abnormal gait, decreased activity tolerance, decreased balance, decreased mobility, difficulty walking, decreased ROM, decreased strength, and pain.   ACTIVITY LIMITATIONS: carrying, lifting, bending, standing, squatting, stairs, transfers, and locomotion level  PARTICIPATION LIMITATIONS: driving, community activity, occupation, and yard work  PERSONAL FACTORS: Fitness, Time since  onset of injury/illness/exacerbation, and 3+ comorbidities: CVA, DVT, HTN  are also affecting patient's functional outcome.   REHAB POTENTIAL: Good  CLINICAL DECISION MAKING: Evolving/moderate complexity  EVALUATION COMPLEXITY: Moderate   GOALS: Goals reviewed with patient? No  SHORT TERM GOALS: Target date: 11/20/2022   Pt will be compliant and knowledgeable with initial HEP for improved comfort and carryover Baseline: initial HEP given  Goal status: INITIAL  2.  Pt will self report left knee pain no greater than 7/10 for improved comfort and functional ability Baseline: 10/10 at worst Goal status: INITIAL   LONG TERM GOALS: Target date: 12/25/2022   Pt will self report left knee pain no greater than 3/10 for improved comfort and functional ability Baseline: 10/10 at worst Goal status: INITIAL   2.  Pt will improve FOTO function score to no less than 54% as proxy for functional improvement Baseline: 43% function Goal status: INITIAL   3.  Pt will increase 30 Second Sit to Stand rep count to no less than 11 reps for improved balance, strength, and functional mobility Baseline: 9 reps with UE Goal status: INITIAL   4.  Pt will improve L knee ROM to no less than 3-105 for improved comfort and functional mobility Baseline: 5-95 Goal status: INITIAL   PLAN:  PT FREQUENCY: 2x/week  PT DURATION: 8 weeks  PLANNED INTERVENTIONS: Therapeutic exercises, Therapeutic activity, Neuromuscular re-education, Balance training, Gait training, Patient/Family education, Self Care, Joint mobilization, Aquatic Therapy, Dry Needling, Electrical stimulation, Cryotherapy, Moist heat, Vasopneumatic device, Manual therapy, and Re-evaluation  PLAN FOR NEXT SESSION: assess HEP response, quad and hip strengthening, gait training   Referring diagnosis?  M17.12 (ICD-10-CM) - Unilateral primary osteoarthritis, left knee  Treatment diagnosis? (if different than referring diagnosis)  Chronic pain  of left knee Muscle weakness (generalized) Other abnormalities of gait and mobility Localized edema What was this (referring dx) caused by? []  Surgery []  Fall []  Ongoing issue [x]  Arthritis []  Other: ____________  Laterality: []  Rt [x]  Lt []  Both  Check all possible CPT codes:  *CHOOSE 10 OR LESS*    [x]  97110 (Therapeutic Exercise)  []  92507 (SLP Treatment)  [x]  97112 (Neuro Re-ed)   []  (Swallowing Treatment)   [x]  (Gait Training)   []  [phone] (Cognitive Training,  1st 15 minutes) [x]  97140 (Manual Therapy)   []  97130 (Cognitive Training, each add'l 15 minutes)  [x]  97164 (Re-evaluation)                              []  Other, List CPT Code ____________  [x]  97530 (Therapeutic Activities)     [x]  97535 (Self Care)   []  All codes above (97110 - 97535)  []  97012 (Mechanical Traction)  []  97014 (E-stim Unattended)  []  97032 (E-stim manual)  []  97033 (Ionto)  []  97035 (Ultrasound) []  97750 (Physical Performance Training) [x]  [id number] (Aquatic Therapy) [x]  97016 (Vasopneumatic Device) []  [id number] (Paraffin) []  97034 (Contrast Bath) []  97597 (Wound Care 1st 20 sq cm) []  97598 (Wound Care each add'l 20 sq cm) []  97760 (Orthotic Fabrication, Fitting, Training Initial) []  [id number] (Prosthetic Management and Training Initial) []  41583 (Orthotic or Prosthetic Training/ Modification Subsequent)    Eloy End, PT 10/30/2022, 1:33 PM

## 2022-10-31 ENCOUNTER — Telehealth: Payer: Self-pay | Admitting: Orthopaedic Surgery

## 2022-10-31 NOTE — Telephone Encounter (Signed)
10/29/22 ov note faxed [facility] [phone]/referring office

## 2022-11-04 ENCOUNTER — Ambulatory Visit: Payer: Medicare HMO

## 2022-11-04 DIAGNOSIS — G8929 Other chronic pain: Secondary | ICD-10-CM

## 2022-11-04 DIAGNOSIS — M6281 Muscle weakness (generalized): Secondary | ICD-10-CM

## 2022-11-04 DIAGNOSIS — R600 Localized edema: Secondary | ICD-10-CM

## 2022-11-04 DIAGNOSIS — R2689 Other abnormalities of gait and mobility: Secondary | ICD-10-CM

## 2022-11-04 DIAGNOSIS — M25562 Pain in left knee: Secondary | ICD-10-CM | POA: Diagnosis not present

## 2022-11-04 NOTE — Therapy (Signed)
OUTPATIENT PHYSICAL THERAPY TREATMENT NOTE   Patient Name: Jesus Lam MRN: [id number] DOB:1933-04-14, 88 y.o., male Today's Date: 11/04/2022  PCP: Unknown  REFERRING PROVIDER: Roswell Miners, MD   END OF SESSION:   PT End of Session - 11/04/22 1319     Visit Number 2    Number of Visits 17    Date for PT Re-Evaluation 12/25/22    Authorization Type [employer] MCR    PT Start Time 1320    PT Stop Time 1400    PT Time Calculation (min) 40 min    Activity Tolerance Patient tolerated treatment well    Behavior During Therapy WFL for tasks assessed/performed             Past Medical History:  Diagnosis Date   Anemia    BPH (benign prostatic hyperplasia)    Cataracts, bilateral    CVA (cerebral vascular accident) (HCC)    DVT (deep venous thrombosis) (HCC)    dvt in left leg   Dyslipidemia    Gout    Gout    HTN (hypertension)    Left hip pain 03/15/2020   Left leg pain    Osteoarthritis    PAD (peripheral artery disease) (HCC)    Stasis dermatitis    Stroke (HCC)    Vitamin D deficiency    Past Surgical History:  Procedure Laterality Date   ABDOMINAL AORTOGRAM N/A 01/13/2018   Procedure: ABDOMINAL AORTOGRAM;  Surgeon: Elder Negus, MD;  [facility]: [facility];  Service: Cardiovascular;  Laterality: N/A;   CATARACT EXTRACTION, BILATERAL     LEFT HEART CATH AND CORONARY ANGIOGRAPHY N/A 01/13/2018   Procedure: LEFT HEART CATH AND CORONARY ANGIOGRAPHY;  Surgeon: Elder Negus, MD;  [facility]: [facility];  Service: Cardiovascular;  Laterality: N/A;   LOWER EXTREMITY ANGIOGRAPHY N/A 01/13/2018   Procedure: LOWER EXTREMITY ANGIOGRAPHY;  Surgeon: Elder Negus, MD;  [facility]: [facility];  Service: Cardiovascular;  Laterality: N/A;   LOWER EXTREMITY ANGIOGRAPHY Right 01/27/2018   Procedure: LOWER EXTREMITY ANGIOGRAPHY;  Surgeon: Yates Decamp, MD;  [facility]: [facility];  Service: Cardiovascular;  Laterality: Right;   PERIPHERAL  VASCULAR ATHERECTOMY Left 01/13/2018   Procedure: PERIPHERAL VASCULAR ATHERECTOMY;  Surgeon: Elder Negus, MD;  [facility]: [facility];  Service: Cardiovascular;  Laterality: Left;  SFA WITH PTA DRUG COATED BALLOON   PERIPHERAL VASCULAR INTERVENTION  01/27/2018   Procedure: PERIPHERAL VASCULAR INTERVENTION;  Surgeon: Yates Decamp, MD;  [facility]: [facility];  Service: Cardiovascular;;   Patient Active Problem List   Diagnosis Date Noted   Leg edema 08/01/2021   Mild cognitive impairment 09/07/2020   Chronic pain of left knee 03/15/2020   Pure hypercholesterolemia 11/15/2019   First degree AV block 07/28/2019   Hypertriglyceridemia 04/29/2019   Mixed hyperlipidemia 04/29/2019   CVA (cerebral vascular accident) (HCC)    Bradycardia    Subclinical hyperthyroidism 11/18/2018   Peripheral artery disease (HCC) 06/13/2018   Ischemic cerebrovascular accident (CVA) (HCC) 06/13/2018   Dysarthria 06/12/2018   Renal insufficiency 06/12/2018   Claudication (HCC) 01/11/2018   Anemia 10/10/2017   Essential hypertension 11/04/2013    REFERRING DIAG: M17.12 (ICD-10-CM) - Unilateral primary osteoarthritis, left knee   THERAPY DIAG:  Chronic pain of left knee  Muscle weakness (generalized)  Other abnormalities of gait and mobility  Localized edema  Rationale for Evaluation and Treatment Rehabilitation  PERTINENT HISTORY: CVA, DVT, HTN   PRECAUTIONS: None  SUBJECTIVE:  SUBJECTIVE STATEMENT:  Patient presents to PT with improved pain in L knee today and reports sporadic HEP compliance.    PAIN:  Are you having pain?  Yes: NPRS scale: 6/10 Pain location: left medial knee Pain description: sharp Aggravating factors: walking, stairs Relieving factors: rest, medication   OBJECTIVE: (objective  measures completed at initial evaluation unless otherwise dated)   DIAGNOSTIC FINDINGS:             Three-view x-rays show severe tricompartmental osteoarthritis.  Medial  compartment is bone-on-bone with varus deformity.    PATIENT SURVEYS:  FOTO: 43% function; 54% predicted    COGNITION: Overall cognitive status: Within functional limits for tasks assessed                         SENSATION: WFL   POSTURE: rounded shoulders and forward head   PALPATION: TTP to medial L knee, distal L quad   LOWER EXTREMITY ROM:   Active ROM Right eval Left eval  Hip flexion      Hip extension      Hip abduction      Hip adduction      Hip internal rotation      Hip external rotation      Knee flexion WFL 95  Knee extension WFL 5  Ankle dorsiflexion      Ankle plantarflexion      Ankle inversion      Ankle eversion       (Blank rows = not tested)   LOWER EXTREMITY MMT:   MMT Right eval Left eval  Hip flexion 4/5 4/5  Hip extension      Hip abduction 3+/5 3+/5  Hip adduction      Hip internal rotation      Hip external rotation      Knee flexion 4/5 4/5  Knee extension 4/5 3+/5  Ankle dorsiflexion      Ankle plantarflexion      Ankle inversion      Ankle eversion       (Blank rows = not tested)   LOWER EXTREMITY SPECIAL TESTS:  DNT   FUNCTIONAL TESTS:  30 Second Sit to Stand: 9 reps - with UE support   GAIT: Distance walked: 14ft Assistive device utilized: Single point cane Level of assistance: Modified independence Comments: antalgic gait L, decreased L knee flexion     TREATMENT: [facility] Adult PT Treatment:                                                DATE: 11/04/2022 Therapeutic Exercise: Nustep level 5 x 5 mins while gathering subjective Seated marching 2x15 BIL LAQ 2x10 Lt LAQ with hip adduction ball squeeze Lt 2x10 Seated hamstring curl GTB 2x10 Lt Supine clamshell GTB 2x10 Supine marching GTB 2x10 BIL Supine hip adduction ball squeeze 5" hold  2x10  QS Lt 5" hold 2x10 Bridges x10 (Lt hamstring cramp) SLR Lt 2x10 STS 2x10 Gait with SPC O294' rolled into therex   [facility] Adult PT Treatment:                                                DATE: 10/30/2022 Therapeutic Exercise: Supine quad  set x 5 - 5" hold Supine SLR x 5 L Seated clamshell x 15 BTB STS x 10    PATIENT EDUCATION:  Education details: eval findings, FOTO, HEP, POC Person educated: Patient Education method: Explanation, Demonstration, and Handouts Education comprehension: verbalized understanding and returned demonstration   HOME EXERCISE PROGRAM: Access Code: [email or web address] URL: [email or web address] Date: 10/30/2022 Prepared by: Edwinna Areola   Exercises - Supine Quadricep Sets  - 1 x daily - 7 x weekly - 2 sets - 10 reps - 5 sec hold - Active Straight Leg Raise with Quad Set  - 1 x daily - 7 x weekly - 2 sets - 10 reps - Seated Hip Abduction with Resistance  - 1 x daily - 7 x weekly - 3 sets - 10 reps - blue theraband hold - Sit to Stand  - 1 x daily - 7 x weekly - 2 sets - 10 reps   ASSESSMENT:   CLINICAL IMPRESSION: Patient presents to first follow up session reporting improved pain in Lt knee and reports occasional HEP compliance. He ambulates with SPC and displays significant varus on LLE. Session today focused on proximal hip and Lt knee strengthening with mat based exercises today. Plan to incorporate more standing exercises in future sessions. Patient was able to tolerate all prescribed exercises with no adverse effects. Patient continues to benefit from skilled PT services and should be progressed as able to improve functional independence.    OBJECTIVE IMPAIRMENTS: Abnormal gait, decreased activity tolerance, decreased balance, decreased mobility, difficulty walking, decreased ROM, decreased strength, and pain.    ACTIVITY LIMITATIONS: carrying, lifting, bending, standing, squatting, stairs, transfers, and locomotion level   PARTICIPATION  LIMITATIONS: driving, community activity, occupation, and yard work   PERSONAL FACTORS: Fitness, Time since onset of injury/illness/exacerbation, and 3+ comorbidities: CVA, DVT, HTN  are also affecting patient's functional outcome.    REHAB POTENTIAL: Good   CLINICAL DECISION MAKING: Evolving/moderate complexity   EVALUATION COMPLEXITY: Moderate     GOALS: Goals reviewed with patient? No   SHORT TERM GOALS: Target date: 11/20/2022   Pt will be compliant and knowledgeable with initial HEP for improved comfort and carryover Baseline: initial HEP given  Goal status: INITIAL   2.  Pt will self report left knee pain no greater than 7/10 for improved comfort and functional ability Baseline: 10/10 at worst Goal status: INITIAL    LONG TERM GOALS: Target date: 12/25/2022   Pt will self report left knee pain no greater than 3/10 for improved comfort and functional ability Baseline: 10/10 at worst Goal status: INITIAL    2.  Pt will improve FOTO function score to no less than 54% as proxy for functional improvement Baseline: 43% function Goal status: INITIAL    3.  Pt will increase 30 Second Sit to Stand rep count to no less than 11 reps for improved balance, strength, and functional mobility Baseline: 9 reps with UE Goal status: INITIAL    4.  Pt will improve L knee ROM to no less than 3-105 for improved comfort and functional mobility Baseline: 5-95 Goal status: INITIAL     PLAN:   PT FREQUENCY: 2x/week   PT DURATION: 8 weeks   PLANNED INTERVENTIONS: Therapeutic exercises, Therapeutic activity, Neuromuscular re-education, Balance training, Gait training, Patient/Family education, Self Care, Joint mobilization, Aquatic Therapy, Dry Needling, Electrical stimulation, Cryotherapy, Moist heat, Vasopneumatic device, Manual therapy, and Re-evaluation   PLAN FOR NEXT SESSION: assess HEP response, quad and hip strengthening, gait training  Berta Minor, PTA 11/04/2022,  1:19 PM

## 2022-11-06 ENCOUNTER — Ambulatory Visit: Payer: Medicare HMO

## 2022-11-06 ENCOUNTER — Other Ambulatory Visit: Payer: Self-pay | Admitting: Nurse Practitioner

## 2022-11-06 DIAGNOSIS — R600 Localized edema: Secondary | ICD-10-CM

## 2022-11-06 DIAGNOSIS — R350 Frequency of micturition: Secondary | ICD-10-CM

## 2022-11-06 DIAGNOSIS — M6281 Muscle weakness (generalized): Secondary | ICD-10-CM

## 2022-11-06 DIAGNOSIS — M25562 Pain in left knee: Secondary | ICD-10-CM | POA: Diagnosis not present

## 2022-11-06 DIAGNOSIS — G8929 Other chronic pain: Secondary | ICD-10-CM

## 2022-11-06 DIAGNOSIS — R351 Nocturia: Secondary | ICD-10-CM

## 2022-11-06 DIAGNOSIS — R2689 Other abnormalities of gait and mobility: Secondary | ICD-10-CM

## 2022-11-06 DIAGNOSIS — J310 Chronic rhinitis: Secondary | ICD-10-CM

## 2022-11-06 NOTE — Therapy (Signed)
OUTPATIENT PHYSICAL THERAPY TREATMENT NOTE   Patient Name: Jesus Lam MRN: [id number] DOB:07-20-1933, 88 y.o., male Today's Date: 11/06/2022  PCP: Unknown  REFERRING PROVIDER: Roswell Miners, MD   END OF SESSION:   PT End of Session - 11/06/22 1526     Visit Number 3    Number of Visits 17    Date for PT Re-Evaluation 12/25/22    Authorization Type [employer] MCR    PT Start Time 1527    PT Stop Time 1607    PT Time Calculation (min) 40 min    Activity Tolerance Patient tolerated treatment well    Behavior During Therapy WFL for tasks assessed/performed              Past Medical History:  Diagnosis Date   Anemia    BPH (benign prostatic hyperplasia)    Cataracts, bilateral    CVA (cerebral vascular accident) (HCC)    DVT (deep venous thrombosis) (HCC)    dvt in left leg   Dyslipidemia    Gout    Gout    HTN (hypertension)    Left hip pain 03/15/2020   Left leg pain    Osteoarthritis    PAD (peripheral artery disease) (HCC)    Stasis dermatitis    Stroke (HCC)    Vitamin D deficiency    Past Surgical History:  Procedure Laterality Date   ABDOMINAL AORTOGRAM N/A 01/13/2018   Procedure: ABDOMINAL AORTOGRAM;  Surgeon: Elder Negus, MD;  Location: [facility];  Service: Cardiovascular;  Laterality: N/A;   CATARACT EXTRACTION, BILATERAL     LEFT HEART CATH AND CORONARY ANGIOGRAPHY N/A 01/13/2018   Procedure: LEFT HEART CATH AND CORONARY ANGIOGRAPHY;  Surgeon: Elder Negus, MD;  Location: [facility];  Service: Cardiovascular;  Laterality: N/A;   LOWER EXTREMITY ANGIOGRAPHY N/A 01/13/2018   Procedure: LOWER EXTREMITY ANGIOGRAPHY;  Surgeon: Elder Negus, MD;  Location: [facility];  Service: Cardiovascular;  Laterality: N/A;   LOWER EXTREMITY ANGIOGRAPHY Right 01/27/2018   Procedure: LOWER EXTREMITY ANGIOGRAPHY;  Surgeon: Yates Decamp, MD;  Location: [facility];  Service: Cardiovascular;  Laterality: Right;    PERIPHERAL VASCULAR ATHERECTOMY Left 01/13/2018   Procedure: PERIPHERAL VASCULAR ATHERECTOMY;  Surgeon: Elder Negus, MD;  Location: [facility];  Service: Cardiovascular;  Laterality: Left;  SFA WITH PTA DRUG COATED BALLOON   PERIPHERAL VASCULAR INTERVENTION  01/27/2018   Procedure: PERIPHERAL VASCULAR INTERVENTION;  Surgeon: Yates Decamp, MD;  Location: [facility];  Service: Cardiovascular;;   Patient Active Problem List   Diagnosis Date Noted   Leg edema 08/01/2021   Mild cognitive impairment 09/07/2020   Chronic pain of left knee 03/15/2020   Pure hypercholesterolemia 11/15/2019   First degree AV block 07/28/2019   Hypertriglyceridemia 04/29/2019   Mixed hyperlipidemia 04/29/2019   CVA (cerebral vascular accident) (HCC)    Bradycardia    Subclinical hyperthyroidism 11/18/2018   Peripheral artery disease (HCC) 06/13/2018   Ischemic cerebrovascular accident (CVA) (HCC) 06/13/2018   Dysarthria 06/12/2018   Renal insufficiency 06/12/2018   Claudication (HCC) 01/11/2018   Anemia 10/10/2017   Essential hypertension 11/04/2013    REFERRING DIAG: M17.12 (ICD-10-CM) - Unilateral primary osteoarthritis, left knee   THERAPY DIAG:  Chronic pain of left knee  Muscle weakness (generalized)  Other abnormalities of gait and mobility  Localized edema  Rationale for Evaluation and Treatment Rehabilitation  PERTINENT HISTORY: CVA, DVT, HTN   PRECAUTIONS: None  SUBJECTIVE:  SUBJECTIVE STATEMENT:  Pt presents to PT with reports of increased L knee pain. Has been compliant with HEP with no adverse effect. Pt is ready to begin PT at this time.    PAIN:  Are you having pain?  Yes: NPRS scale: 10/10 Pain location: left medial knee Pain description: sharp Aggravating factors: walking,  stairs Relieving factors: rest, medication   OBJECTIVE: (objective measures completed at initial evaluation unless otherwise dated)   DIAGNOSTIC FINDINGS:             Three-view x-rays show severe tricompartmental osteoarthritis.  Medial  compartment is bone-on-bone with varus deformity.    PATIENT SURVEYS:  FOTO: 43% function; 54% predicted    COGNITION: Overall cognitive status: Within functional limits for tasks assessed                         SENSATION: WFL   POSTURE: rounded shoulders and forward head   PALPATION: TTP to medial L knee, distal L quad   LOWER EXTREMITY ROM:   Active ROM Right eval Left eval  Hip flexion      Hip extension      Hip abduction      Hip adduction      Hip internal rotation      Hip external rotation      Knee flexion WFL 95  Knee extension WFL 5  Ankle dorsiflexion      Ankle plantarflexion      Ankle inversion      Ankle eversion       (Blank rows = not tested)   LOWER EXTREMITY MMT:   MMT Right eval Left eval  Hip flexion 4/5 4/5  Hip extension      Hip abduction 3+/5 3+/5  Hip adduction      Hip internal rotation      Hip external rotation      Knee flexion 4/5 4/5  Knee extension 4/5 3+/5  Ankle dorsiflexion      Ankle plantarflexion      Ankle inversion      Ankle eversion       (Blank rows = not tested)   LOWER EXTREMITY SPECIAL TESTS:  DNT   FUNCTIONAL TESTS:  30 Second Sit to Stand: 9 reps - with UE support   GAIT: Distance walked: 60ft Assistive device utilized: Single point cane Level of assistance: Modified independence Comments: antalgic gait L, decreased L knee flexion     TREATMENT: [facility] Adult PT Treatment:                                                DATE: 11/06/2022 Therapeutic Exercise: Nustep level 5 x 5 mins while gathering subjective Seated marching 2x15 BIL LAQ 2x10 2# Seated hamstring curl GTB 2x15 Supine SLR 2x10 each Seated clamshell GTB 3x15 Seated hamstring stretch  2x30" L Seated heel slide L 2x10 - 5" hold STS 2x10 Leg press x 10 - 35#  [facility] Adult PT Treatment:                                                DATE: 11/04/2022 Therapeutic Exercise: Nustep level 5 x 5 mins while gathering subjective Seated  marching 2x15 BIL LAQ 2x10 Lt LAQ with hip adduction ball squeeze Lt 2x10 Seated hamstring curl GTB 2x10 Lt Supine clamshell GTB 2x10 Supine marching GTB 2x10 BIL Supine hip adduction ball squeeze 5" hold 2x10  QS Lt 5" hold 2x10 Bridges x10 (Lt hamstring cramp) SLR Lt 2x10 STS 2x10 Gait with [facility] x185' rolled into therex   [facility] Adult PT Treatment:                                                DATE: 10/30/2022 Therapeutic Exercise: Supine quad set x 5 - 5" hold Supine SLR x 5 L Seated clamshell x 15 BTB STS x 10    PATIENT EDUCATION:  Education details: eval findings, FOTO, HEP, POC Person educated: Patient Education method: Explanation, Demonstration, and Handouts Education comprehension: verbalized understanding and returned demonstration   HOME EXERCISE PROGRAM: Access Code: [email or web address] URL: [email or web address] Date: 10/30/2022 Prepared by: Edwinna Areola   Exercises - Supine Quadricep Sets  - 1 x daily - 7 x weekly - 2 sets - 10 reps - 5 sec hold - Active Straight Leg Raise with Quad Set  - 1 x daily - 7 x weekly - 2 sets - 10 reps - Seated Hip Abduction with Resistance  - 1 x daily - 7 x weekly - 3 sets - 10 reps - blue theraband hold - Sit to Stand  - 1 x daily - 7 x weekly - 2 sets - 10 reps   ASSESSMENT:   CLINICAL IMPRESSION: Pt was able to complete all prescribed exercises with no adverse effect. Therapy today again focused on improving quad and proximal hip strength in order to decrease pain and improve functional mobility. He continues to benefit from skilled PT services, will continue per POC as prescribed,    OBJECTIVE IMPAIRMENTS: Abnormal gait, decreased activity tolerance, decreased balance,  decreased mobility, difficulty walking, decreased ROM, decreased strength, and pain.    ACTIVITY LIMITATIONS: carrying, lifting, bending, standing, squatting, stairs, transfers, and locomotion level   PARTICIPATION LIMITATIONS: driving, community activity, occupation, and yard work   PERSONAL FACTORS: Fitness, Time since onset of injury/illness/exacerbation, and 3+ comorbidities: CVA, DVT, HTN  are also affecting patient's functional outcome.    REHAB POTENTIAL: Good   CLINICAL DECISION MAKING: Evolving/moderate complexity   EVALUATION COMPLEXITY: Moderate     GOALS: Goals reviewed with patient? No   SHORT TERM GOALS: Target date: 11/20/2022   Pt will be compliant and knowledgeable with initial HEP for improved comfort and carryover Baseline: initial HEP given  Goal status: INITIAL   2.  Pt will self report left knee pain no greater than 7/10 for improved comfort and functional ability Baseline: 10/10 at worst Goal status: INITIAL    LONG TERM GOALS: Target date: 12/25/2022   Pt will self report left knee pain no greater than 3/10 for improved comfort and functional ability Baseline: 10/10 at worst Goal status: INITIAL    2.  Pt will improve FOTO function score to no less than 54% as proxy for functional improvement Baseline: 43% function Goal status: INITIAL    3.  Pt will increase 30 Second Sit to Stand rep count to no less than 11 reps for improved balance, strength, and functional mobility Baseline: 9 reps with UE Goal status: INITIAL    4.  Pt will improve L knee ROM to no  less than 3-105 for improved comfort and functional mobility Baseline: 5-95 Goal status: INITIAL     PLAN:   PT FREQUENCY: 2x/week   PT DURATION: 8 weeks   PLANNED INTERVENTIONS: Therapeutic exercises, Therapeutic activity, Neuromuscular re-education, Balance training, Gait training, Patient/Family education, Self Care, Joint mobilization, Aquatic Therapy, Dry Needling, Electrical  stimulation, Cryotherapy, Moist heat, Vasopneumatic device, Manual therapy, and Re-evaluation   PLAN FOR NEXT SESSION: assess HEP response, quad and hip strengthening, gait training    Eloy End, PT 11/06/2022, 5:19 PM

## 2022-11-07 ENCOUNTER — Telehealth: Payer: Self-pay

## 2022-11-07 NOTE — Patient Outreach (Signed)
  Care Coordination   Follow Up Visit Note   11/07/2022 Name: Jesus Lam MRN: [id number] DOB: 07-25-1933  Jesus Lam is a 88 y.o. year old male who sees [facility], Provider Not In for primary care. I  collaboration with patients physical therapist regarding transportation needs.   What matters to the patients health and wellness today?  Unsuccessful call to the patient    Goals Addressed             This Visit's Progress    Care Coordination Activities       Care Coordination Interventions: Collaboration with Edwinna Areola, PT who requests SW support with assisting the patient with arranging [employer] transportation to upcomming therapy sessions. Reports patient took the bus to last visit Attempted to contact the patient, voice message left Mailed the patient education on how to arrange transportation via health plan benefit SW will attempt to contact the patient over the next 3 business days         SDOH assessments and interventions completed:  No     Care Coordination Interventions:  Yes, provided   Follow up plan:  SW will continue to follow    Encounter Outcome:  No Answer   Bevelyn Ngo, BSW, CDP Social Worker, Certified Dementia Practitioner [facility] Care Management  Care Coordination [phone]

## 2022-11-07 NOTE — Patient Instructions (Addendum)
    Visit Information  Thank you for taking time to visit with me today. Please don't hesitate to contact me if I can be of assistance to you.   Following are the goals we discussed today:   Goals Addressed             This Visit's Progress    Care Coordination Activities       Care Coordination Interventions: Collaboration with Edwinna Areola, PT who requests SW support with assisting the patient with arranging [employer] transportation to upcomming therapy sessions. Reports patient took the bus to last visit Attempted to contact the patient, voice message left Mailed the patient education on how to arrange transportation via health plan benefit SW will attempt to contact the patient over the next 3 business days         If you are experiencing a Mental Health or Behavioral Health Crisis or need someone to talk to, please go to [facility] [address] [place] [phone]) call 911    SW will continue to follow to educate patient on health plan benefit  Bevelyn Ngo, Kenard Gower, CDP Social Worker, Certified Dementia Practitioner [facility] Care Management  Care Coordination [phone]

## 2022-11-11 ENCOUNTER — Ambulatory Visit: Payer: Self-pay

## 2022-11-11 NOTE — Patient Outreach (Signed)
  Care Coordination   Follow Up Visit Note   11/11/2022 Name: Barth Trella MRN: [id number] DOB: 1933/11/01  Anselmo Reihl is a 88 y.o. year old male who sees [facility], Provider Not In for primary care. I  spoke with patients daughter today by phone.  What matters to the patients health and wellness today?  Transportation Education    Goals Addressed             This Visit's Progress    COMPLETED: Care Coordination Activities       Care Coordination Interventions: Unable to get in touch with the patient but able to speak with his daughter Hassan Rowan on [place] transportation benefit, information also mailed to patients home address         SDOH assessments and interventions completed:  No     Care Coordination Interventions:  Yes, provided   Follow up plan: No further intervention required.   Encounter Outcome:  Pt. Visit Completed   Bevelyn Ngo, BSW, CDP Social Worker, Certified Dementia Practitioner [facility] Management  Care Coordination [phone]

## 2022-11-11 NOTE — Patient Instructions (Signed)
Visit Information  Thank you for taking time to visit with me today. Please don't hesitate to contact me if I can be of assistance to you.   Following are the goals we discussed today:   Goals Addressed             This Visit's Progress    COMPLETED: Care Coordination Activities       Care Coordination Interventions: Unable to get in touch with the patient but able to speak with his daughter Hassan Rowan on [place] transportation benefit, information also mailed to patients home address         If you are experiencing a Mental Health or Behavioral Health Crisis or need someone to talk to, please call 1-800-273-TALK (toll free, 24 hour hotline) call 911  The patient verbalized understanding of instructions, educational materials, and care plan provided today and DECLINED offer to receive copy of patient instructions, educational materials, and care plan.   No further follow up required: Please contact me as needed  Bevelyn Ngo, BSW, CDP Social Worker, Certified Dementia Practitioner [facility] Care Management  Care Coordination [phone]

## 2022-11-12 ENCOUNTER — Ambulatory Visit: Payer: Medicare HMO

## 2022-11-12 DIAGNOSIS — M25562 Pain in left knee: Secondary | ICD-10-CM | POA: Diagnosis not present

## 2022-11-12 DIAGNOSIS — R600 Localized edema: Secondary | ICD-10-CM

## 2022-11-12 DIAGNOSIS — R2689 Other abnormalities of gait and mobility: Secondary | ICD-10-CM

## 2022-11-12 DIAGNOSIS — M6281 Muscle weakness (generalized): Secondary | ICD-10-CM

## 2022-11-12 DIAGNOSIS — G8929 Other chronic pain: Secondary | ICD-10-CM

## 2022-11-12 NOTE — Therapy (Signed)
OUTPATIENT PHYSICAL THERAPY TREATMENT NOTE   Patient Name: Jesus Lam MRN: [id number] DOB:1933/08/13, 88 y.o., male Today's Date: 11/12/2022  PCP: Unknown  REFERRING PROVIDER: Roswell Miners, MD   END OF SESSION:   PT End of Session - 11/12/22 1448     Visit Number 4    Number of Visits 17    Date for PT Re-Evaluation 12/25/22    Authorization Type [employer] MCR    PT Start Time 1442    PT Stop Time 1522    PT Time Calculation (min) 40 min    Activity Tolerance Patient tolerated treatment well    Behavior During Therapy WFL for tasks assessed/performed               Past Medical History:  Diagnosis Date   Anemia    BPH (benign prostatic hyperplasia)    Cataracts, bilateral    CVA (cerebral vascular accident) (HCC)    DVT (deep venous thrombosis) (HCC)    dvt in left leg   Dyslipidemia    Gout    Gout    HTN (hypertension)    Left hip pain 03/15/2020   Left leg pain    Osteoarthritis    PAD (peripheral artery disease) (HCC)    Stasis dermatitis    Stroke (HCC)    Vitamin D deficiency    Past Surgical History:  Procedure Laterality Date   ABDOMINAL AORTOGRAM N/A 01/13/2018   Procedure: ABDOMINAL AORTOGRAM;  Surgeon: Elder Negus, MD;  Location: [facility];  Service: Cardiovascular;  Laterality: N/A;   CATARACT EXTRACTION, BILATERAL     LEFT HEART CATH AND CORONARY ANGIOGRAPHY N/A 01/13/2018   Procedure: LEFT HEART CATH AND CORONARY ANGIOGRAPHY;  Surgeon: Elder Negus, MD;  Location: [facility];  Service: Cardiovascular;  Laterality: N/A;   LOWER EXTREMITY ANGIOGRAPHY N/A 01/13/2018   Procedure: LOWER EXTREMITY ANGIOGRAPHY;  Surgeon: Elder Negus, MD;  Location: [facility];  Service: Cardiovascular;  Laterality: N/A;   LOWER EXTREMITY ANGIOGRAPHY Right 01/27/2018   Procedure: LOWER EXTREMITY ANGIOGRAPHY;  Surgeon: Yates Decamp, MD;  Location: [facility];  Service: Cardiovascular;  Laterality: Right;    PERIPHERAL VASCULAR ATHERECTOMY Left 01/13/2018   Procedure: PERIPHERAL VASCULAR ATHERECTOMY;  Surgeon: Elder Negus, MD;  Location: [facility];  Service: Cardiovascular;  Laterality: Left;  SFA WITH PTA DRUG COATED BALLOON   PERIPHERAL VASCULAR INTERVENTION  01/27/2018   Procedure: PERIPHERAL VASCULAR INTERVENTION;  Surgeon: Yates Decamp, MD;  Location: [facility];  Service: Cardiovascular;;   Patient Active Problem List   Diagnosis Date Noted   Leg edema 08/01/2021   Mild cognitive impairment 09/07/2020   Chronic pain of left knee 03/15/2020   Pure hypercholesterolemia 11/15/2019   First degree AV block 07/28/2019   Hypertriglyceridemia 04/29/2019   Mixed hyperlipidemia 04/29/2019   CVA (cerebral vascular accident) (HCC)    Bradycardia    Subclinical hyperthyroidism 11/18/2018   Peripheral artery disease (HCC) 06/13/2018   Ischemic cerebrovascular accident (CVA) (HCC) 06/13/2018   Dysarthria 06/12/2018   Renal insufficiency 06/12/2018   Claudication (HCC) 01/11/2018   Anemia 10/10/2017   Essential hypertension 11/04/2013    REFERRING DIAG: M17.12 (ICD-10-CM) - Unilateral primary osteoarthritis, left knee   THERAPY DIAG:  Chronic pain of left knee  Muscle weakness (generalized)  Other abnormalities of gait and mobility  Localized edema  Rationale for Evaluation and Treatment Rehabilitation  PERTINENT HISTORY: CVA, DVT, HTN   PRECAUTIONS: None  SUBJECTIVE:  SUBJECTIVE STATEMENT:  Pt presents to PT with continued reports of L knee pain. Has been compliant with HEP with no adverse effect. Ready to begin PT at this time.    PAIN:  Are you having pain?  Yes: NPRS scale: 10/10 Pain location: left medial knee Pain description: sharp Aggravating factors: walking,  stairs Relieving factors: rest, medication   OBJECTIVE: (objective measures completed at initial evaluation unless otherwise dated)   DIAGNOSTIC FINDINGS:             Three-view x-rays show severe tricompartmental osteoarthritis.  Medial  compartment is bone-on-bone with varus deformity.    PATIENT SURVEYS:  FOTO: 43% function; 54% predicted    COGNITION: Overall cognitive status: Within functional limits for tasks assessed                         SENSATION: WFL   POSTURE: rounded shoulders and forward head   PALPATION: TTP to medial L knee, distal L quad   LOWER EXTREMITY ROM:   Active ROM Right eval Left eval  Hip flexion      Hip extension      Hip abduction      Hip adduction      Hip internal rotation      Hip external rotation      Knee flexion WFL 95  Knee extension WFL 5  Ankle dorsiflexion      Ankle plantarflexion      Ankle inversion      Ankle eversion       (Blank rows = not tested)   LOWER EXTREMITY MMT:   MMT Right eval Left eval  Hip flexion 4/5 4/5  Hip extension      Hip abduction 3+/5 3+/5  Hip adduction      Hip internal rotation      Hip external rotation      Knee flexion 4/5 4/5  Knee extension 4/5 3+/5  Ankle dorsiflexion      Ankle plantarflexion      Ankle inversion      Ankle eversion       (Blank rows = not tested)   LOWER EXTREMITY SPECIAL TESTS:  DNT   FUNCTIONAL TESTS:  30 Second Sit to Stand: 9 reps - with UE support   GAIT: Distance walked: 60ft Assistive device utilized: Single point cane Level of assistance: Modified independence Comments: antalgic gait L, decreased L knee flexion     TREATMENT: [facility] Adult PT Treatment:                                                DATE: 11/12/2022 Therapeutic Exercise: Nustep level 5 x 5 mins while gathering subjective Seated hamstring curl 2x10 BTB LAQ 3x10 3# STS 2x10 - high table Seated clamshell 3x20 BTB Seated hamstring stretch 2x30" L Seated heel slide L  2x10 - 5" hold Supine quad set x 10 -3" hold Supine SLR 2x10 Supine ball squeeze 2x10 - 3" hold  [facility] Adult PT Treatment:                                                DATE: 11/06/2022 Therapeutic Exercise: Nustep level 5 x 5 mins  while gathering subjective Seated marching 2x15 BIL LAQ 2x10 2# Seated hamstring curl GTB 2x15 Supine SLR 2x10 each Seated clamshell GTB 3x15 Seated hamstring stretch 2x30" L Seated heel slide L 2x10 - 5" hold STS 2x10 Leg press x 10 - 35#  [facility] Adult PT Treatment:                                                DATE: 11/04/2022 Therapeutic Exercise: Nustep level 5 x 5 mins while gathering subjective Seated marching 2x15 BIL LAQ 2x10 Lt LAQ with hip adduction ball squeeze Lt 2x10 Seated hamstring curl GTB 2x10 Lt Supine clamshell GTB 2x10 Supine marching GTB 2x10 BIL Supine hip adduction ball squeeze 5" hold 2x10  QS Lt 5" hold 2x10 Bridges x10 (Lt hamstring cramp) SLR Lt 2x10 STS 2x10 Gait with [facility] x185' rolled into therex   [facility] Adult PT Treatment:                                                DATE: 10/30/2022 Therapeutic Exercise: Supine quad set x 5 - 5" hold Supine SLR x 5 L Seated clamshell x 15 BTB STS x 10    PATIENT EDUCATION:  Education details: eval findings, FOTO, HEP, POC Person educated: Patient Education method: Explanation, Demonstration, and Handouts Education comprehension: verbalized understanding and returned demonstration   HOME EXERCISE PROGRAM: Access Code: [email or web address] URL: [email or web address] Date: 10/30/2022 Prepared by: Edwinna Areola   Exercises - Supine Quadricep Sets  - 1 x daily - 7 x weekly - 2 sets - 10 reps - 5 sec hold - Active Straight Leg Raise with Quad Set  - 1 x daily - 7 x weekly - 2 sets - 10 reps - Seated Hip Abduction with Resistance  - 1 x daily - 7 x weekly - 3 sets - 10 reps - blue theraband hold - Sit to Stand  - 1 x daily - 7 x weekly - 2 sets - 10 reps   ASSESSMENT:    CLINICAL IMPRESSION: Pt was able to complete all prescribed exercises with no adverse effect. Therapy today again focused on improving quad and proximal hip strength in order to decrease pain and improve functional mobility. He continues to benefit from skilled PT services, will continue per POC as prescribed.  OBJECTIVE IMPAIRMENTS: Abnormal gait, decreased activity tolerance, decreased balance, decreased mobility, difficulty walking, decreased ROM, decreased strength, and pain.    ACTIVITY LIMITATIONS: carrying, lifting, bending, standing, squatting, stairs, transfers, and locomotion level   PARTICIPATION LIMITATIONS: driving, community activity, occupation, and yard work   PERSONAL FACTORS: Fitness, Time since onset of injury/illness/exacerbation, and 3+ comorbidities: CVA, DVT, HTN  are also affecting patient's functional outcome.      GOALS: Goals reviewed with patient? No   SHORT TERM GOALS: Target date: 11/20/2022   Pt will be compliant and knowledgeable with initial HEP for improved comfort and carryover Baseline: initial HEP given  Goal status: INITIAL   2.  Pt will self report left knee pain no greater than 7/10 for improved comfort and functional ability Baseline: 10/10 at worst Goal status: INITIAL    LONG TERM GOALS: Target date: 12/25/2022   Pt will self report left knee pain no greater  than 3/10 for improved comfort and functional ability Baseline: 10/10 at worst Goal status: INITIAL    2.  Pt will improve FOTO function score to no less than 54% as proxy for functional improvement Baseline: 43% function Goal status: INITIAL    3.  Pt will increase 30 Second Sit to Stand rep count to no less than 11 reps for improved balance, strength, and functional mobility Baseline: 9 reps with UE Goal status: INITIAL    4.  Pt will improve L knee ROM to no less than 3-105 for improved comfort and functional mobility Baseline: 5-95 Goal status: INITIAL     PLAN:   PT  FREQUENCY: 2x/week   PT DURATION: 8 weeks   PLANNED INTERVENTIONS: Therapeutic exercises, Therapeutic activity, Neuromuscular re-education, Balance training, Gait training, Patient/Family education, Self Care, Joint mobilization, Aquatic Therapy, Dry Needling, Electrical stimulation, Cryotherapy, Moist heat, Vasopneumatic device, Manual therapy, and Re-evaluation   PLAN FOR NEXT SESSION: assess HEP response, quad and hip strengthening, gait training    Eloy End, PT 11/12/2022, 3:24 PM

## 2022-11-14 ENCOUNTER — Ambulatory Visit: Payer: Medicare HMO

## 2022-11-14 DIAGNOSIS — R2689 Other abnormalities of gait and mobility: Secondary | ICD-10-CM

## 2022-11-14 DIAGNOSIS — G8929 Other chronic pain: Secondary | ICD-10-CM

## 2022-11-14 DIAGNOSIS — M25562 Pain in left knee: Secondary | ICD-10-CM | POA: Diagnosis not present

## 2022-11-14 DIAGNOSIS — M6281 Muscle weakness (generalized): Secondary | ICD-10-CM

## 2022-11-14 NOTE — Therapy (Signed)
OUTPATIENT PHYSICAL THERAPY TREATMENT NOTE   Patient Name: Jesus Lam MRN: [id number] DOB:Mar 05, 1933, 88 y.o., male Today's Date: 11/14/2022  PCP: Unknown  REFERRING PROVIDER: Roswell Miners, MD   END OF SESSION:   PT End of Session - 11/14/22 1527     Visit Number 5    Number of Visits 17    Date for PT Re-Evaluation 12/25/22    Authorization Type [employer] MCR    PT Start Time 1530    PT Stop Time 1609    PT Time Calculation (min) 39 min    Activity Tolerance Patient tolerated treatment well    Behavior During Therapy WFL for tasks assessed/performed                Past Medical History:  Diagnosis Date   Anemia    BPH (benign prostatic hyperplasia)    Cataracts, bilateral    CVA (cerebral vascular accident) (HCC)    DVT (deep venous thrombosis) (HCC)    dvt in left leg   Dyslipidemia    Gout    Gout    HTN (hypertension)    Left hip pain 03/15/2020   Left leg pain    Osteoarthritis    PAD (peripheral artery disease) (HCC)    Stasis dermatitis    Stroke (HCC)    Vitamin D deficiency    Past Surgical History:  Procedure Laterality Date   ABDOMINAL AORTOGRAM N/A 01/13/2018   Procedure: ABDOMINAL AORTOGRAM;  Surgeon: Elder Negus, MD;  Location: [facility];  Service: Cardiovascular;  Laterality: N/A;   CATARACT EXTRACTION, BILATERAL     LEFT HEART CATH AND CORONARY ANGIOGRAPHY N/A 01/13/2018   Procedure: LEFT HEART CATH AND CORONARY ANGIOGRAPHY;  Surgeon: Elder Negus, MD;  Location: [facility];  Service: Cardiovascular;  Laterality: N/A;   LOWER EXTREMITY ANGIOGRAPHY N/A 01/13/2018   Procedure: LOWER EXTREMITY ANGIOGRAPHY;  Surgeon: Elder Negus, MD;  Location: [facility];  Service: Cardiovascular;  Laterality: N/A;   LOWER EXTREMITY ANGIOGRAPHY Right 01/27/2018   Procedure: LOWER EXTREMITY ANGIOGRAPHY;  Surgeon: Yates Decamp, MD;  Location: [facility];  Service: Cardiovascular;  Laterality: Right;    PERIPHERAL VASCULAR ATHERECTOMY Left 01/13/2018   Procedure: PERIPHERAL VASCULAR ATHERECTOMY;  Surgeon: Elder Negus, MD;  Location: [facility];  Service: Cardiovascular;  Laterality: Left;  SFA WITH PTA DRUG COATED BALLOON   PERIPHERAL VASCULAR INTERVENTION  01/27/2018   Procedure: PERIPHERAL VASCULAR INTERVENTION;  Surgeon: Yates Decamp, MD;  Location: [facility];  Service: Cardiovascular;;   Patient Active Problem List   Diagnosis Date Noted   Leg edema 08/01/2021   Mild cognitive impairment 09/07/2020   Chronic pain of left knee 03/15/2020   Pure hypercholesterolemia 11/15/2019   First degree AV block 07/28/2019   Hypertriglyceridemia 04/29/2019   Mixed hyperlipidemia 04/29/2019   CVA (cerebral vascular accident) (HCC)    Bradycardia    Subclinical hyperthyroidism 11/18/2018   Peripheral artery disease (HCC) 06/13/2018   Ischemic cerebrovascular accident (CVA) (HCC) 06/13/2018   Dysarthria 06/12/2018   Renal insufficiency 06/12/2018   Claudication (HCC) 01/11/2018   Anemia 10/10/2017   Essential hypertension 11/04/2013    REFERRING DIAG: M17.12 (ICD-10-CM) - Unilateral primary osteoarthritis, left knee   THERAPY DIAG:  Chronic pain of left knee  Muscle weakness (generalized)  Other abnormalities of gait and mobility  Rationale for Evaluation and Treatment Rehabilitation  PERTINENT HISTORY: CVA, DVT, HTN   PRECAUTIONS: None  SUBJECTIVE:  SUBJECTIVE STATEMENT:  Pt presents to PT with reports of continued pain in L knee. Has been compliant with HEP with no adverse effect. Pt explained to PT after a few exercises that he had blood in his urine earlier today and he was wondering what he should do. States he wants to complete PT session and that he feels fine.     PAIN:  Are  you having pain?  Yes: NPRS scale: 10/10 Pain location: left medial knee Pain description: sharp Aggravating factors: walking, stairs Relieving factors: rest, medication   OBJECTIVE: (objective measures completed at initial evaluation unless otherwise dated)   DIAGNOSTIC FINDINGS:             Three-view x-rays show severe tricompartmental osteoarthritis.  Medial  compartment is bone-on-bone with varus deformity.    PATIENT SURVEYS:  FOTO: 43% function; 54% predicted    COGNITION: Overall cognitive status: Within functional limits for tasks assessed                         SENSATION: WFL   POSTURE: rounded shoulders and forward head   PALPATION: TTP to medial L knee, distal L quad   LOWER EXTREMITY ROM:   Active ROM Right eval Left eval  Hip flexion      Hip extension      Hip abduction      Hip adduction      Hip internal rotation      Hip external rotation      Knee flexion WFL 95  Knee extension WFL 5  Ankle dorsiflexion      Ankle plantarflexion      Ankle inversion      Ankle eversion       (Blank rows = not tested)   LOWER EXTREMITY MMT:   MMT Right eval Left eval  Hip flexion 4/5 4/5  Hip extension      Hip abduction 3+/5 3+/5  Hip adduction      Hip internal rotation      Hip external rotation      Knee flexion 4/5 4/5  Knee extension 4/5 3+/5  Ankle dorsiflexion      Ankle plantarflexion      Ankle inversion      Ankle eversion       (Blank rows = not tested)   LOWER EXTREMITY SPECIAL TESTS:  DNT   FUNCTIONAL TESTS:  30 Second Sit to Stand: 9 reps - with UE support   GAIT: Distance walked: 46ft Assistive device utilized: Single point cane Level of assistance: Modified independence Comments: antalgic gait L, decreased L knee flexion     TREATMENT: [facility] Adult PT Treatment:                                                DATE: 11/14/2022 Therapeutic Exercise: Nustep level 5 x 5 mins while gathering subjective Seated hamstring  curl 2x10 BTB LAQ 3x10 3# STS 2x10 - high table Seated clamshell 3x20 BTB Seated hamstring stretch 2x30" L Seated heel slide L 2x15 - 5" hold Supine quad set x 10 -3" hold Supine SLR 2x10 Seated pilates ring squeeze 2x15 - 3" hold Standing hip abd/ext 2x10  [facility] Adult PT Treatment:  DATE: 11/12/2022 Therapeutic Exercise: Nustep level 5 x 5 mins while gathering subjective Seated hamstring curl 2x10 BTB LAQ 3x10 3# STS 2x10 - high table Seated clamshell 3x20 BTB Seated hamstring stretch 2x30" L Seated heel slide L 2x10 - 5" hold Supine quad set x 10 -3" hold Supine SLR 2x10 Supine ball squeeze 2x10 - 3" hold  [facility] Adult PT Treatment:                                                DATE: 11/06/2022 Therapeutic Exercise: Nustep level 5 x 5 mins while gathering subjective Seated marching 2x15 BIL LAQ 2x10 2# Seated hamstring curl GTB 2x15 Supine SLR 2x10 each Seated clamshell GTB 3x15 Seated hamstring stretch 2x30" L Seated heel slide L 2x10 - 5" hold STS 2x10 Leg press x 10 - 35#  [facility] Adult PT Treatment:                                                DATE: 11/04/2022 Therapeutic Exercise: Nustep level 5 x 5 mins while gathering subjective Seated marching 2x15 BIL LAQ 2x10 Lt LAQ with hip adduction ball squeeze Lt 2x10 Seated hamstring curl GTB 2x10 Lt Supine clamshell GTB 2x10 Supine marching GTB 2x10 BIL Supine hip adduction ball squeeze 5" hold 2x10  QS Lt 5" hold 2x10 Bridges x10 (Lt hamstring cramp) SLR Lt 2x10 STS 2x10 Gait with [facility] x185' rolled into therex   [facility] Adult PT Treatment:                                                DATE: 10/30/2022 Therapeutic Exercise: Supine quad set x 5 - 5" hold Supine SLR x 5 L Seated clamshell x 15 BTB STS x 10    PATIENT EDUCATION:  Education details: eval findings, FOTO, HEP, POC Person educated: Patient Education method: Explanation, Demonstration, and  Handouts Education comprehension: verbalized understanding and returned demonstration   HOME EXERCISE PROGRAM: Access Code: [email or web address] URL: [email or web address] Date: 10/30/2022 Prepared by: Edwinna Areola   Exercises - Supine Quadricep Sets  - 1 x daily - 7 x weekly - 2 sets - 10 reps - 5 sec hold - Active Straight Leg Raise with Quad Set  - 1 x daily - 7 x weekly - 2 sets - 10 reps - Seated Hip Abduction with Resistance  - 1 x daily - 7 x weekly - 3 sets - 10 reps - blue theraband hold - Sit to Stand  - 1 x daily - 7 x weekly - 2 sets - 10 reps   ASSESSMENT:   CLINICAL IMPRESSION: Pt was able to complete all prescribed exercises with no adverse effect. Therapy today again focused on improving quad and proximal hip strength in order to decrease pain and improve functional mobility. PT encouraged pt to call PCP office at end of session about blood in urine from earlier today. PT explained that they may want him to come to office or go to ED/urgent care. Pt verbalized understanding and stated he would reach out. He continues to benefit from skilled PT services, will  continue per POC as prescribed.   OBJECTIVE IMPAIRMENTS: Abnormal gait, decreased activity tolerance, decreased balance, decreased mobility, difficulty walking, decreased ROM, decreased strength, and pain.    ACTIVITY LIMITATIONS: carrying, lifting, bending, standing, squatting, stairs, transfers, and locomotion level   PARTICIPATION LIMITATIONS: driving, community activity, occupation, and yard work   PERSONAL FACTORS: Fitness, Time since onset of injury/illness/exacerbation, and 3+ comorbidities: CVA, DVT, HTN  are also affecting patient's functional outcome.      GOALS: Goals reviewed with patient? No   SHORT TERM GOALS: Target date: 11/20/2022   Pt will be compliant and knowledgeable with initial HEP for improved comfort and carryover Baseline: initial HEP given  Goal status: INITIAL   2.  Pt will self  report left knee pain no greater than 7/10 for improved comfort and functional ability Baseline: 10/10 at worst Goal status: INITIAL    LONG TERM GOALS: Target date: 12/25/2022   Pt will self report left knee pain no greater than 3/10 for improved comfort and functional ability Baseline: 10/10 at worst Goal status: INITIAL    2.  Pt will improve FOTO function score to no less than 54% as proxy for functional improvement Baseline: 43% function Goal status: INITIAL    3.  Pt will increase 30 Second Sit to Stand rep count to no less than 11 reps for improved balance, strength, and functional mobility Baseline: 9 reps with UE Goal status: INITIAL    4.  Pt will improve L knee ROM to no less than 3-105 for improved comfort and functional mobility Baseline: 5-95 Goal status: INITIAL     PLAN:   PT FREQUENCY: 2x/week   PT DURATION: 8 weeks   PLANNED INTERVENTIONS: Therapeutic exercises, Therapeutic activity, Neuromuscular re-education, Balance training, Gait training, Patient/Family education, Self Care, Joint mobilization, Aquatic Therapy, Dry Needling, Electrical stimulation, Cryotherapy, Moist heat, Vasopneumatic device, Manual therapy, and Re-evaluation   PLAN FOR NEXT SESSION: assess HEP response, quad and hip strengthening, gait training    Eloy End, PT 11/14/2022, 4:20 PM

## 2022-11-19 ENCOUNTER — Ambulatory Visit: Payer: Medicare HMO

## 2022-11-19 DIAGNOSIS — G8929 Other chronic pain: Secondary | ICD-10-CM

## 2022-11-19 DIAGNOSIS — M6281 Muscle weakness (generalized): Secondary | ICD-10-CM

## 2022-11-19 DIAGNOSIS — M25562 Pain in left knee: Secondary | ICD-10-CM | POA: Diagnosis not present

## 2022-11-19 NOTE — Therapy (Signed)
OUTPATIENT PHYSICAL THERAPY TREATMENT NOTE   Patient Name: Jesus Lam MRN: [id number] DOB:06-19-1933, 88 y.o., male Today's Date: 11/19/2022  PCP: Unknown  REFERRING PROVIDER: Roswell Miners, MD   END OF SESSION:   PT End of Session - 11/19/22 1418     Visit Number 6    Number of Visits 17    Date for PT Re-Evaluation 12/25/22    Authorization Type [employer] M[employer]    PT Start Time 1415   arrived late   PT Stop Time 1442    PT Time Calculation (min) 27 min    Activity Tolerance Patient tolerated treatment well    Behavior During Therapy WFL for tasks assessed/performed                 Past Medical History:  Diagnosis Date   Anemia    BPH (benign prostatic hyperplasia)    Cataracts, bilateral    CVA (cerebral vascular accident) (HCC)    DVT (deep venous thrombosis) (HCC)    dvt in left leg   Dyslipidemia    Gout    Gout    HTN (hypertension)    Left hip pain 03/15/2020   Left leg pain    Osteoarthritis    PAD (peripheral artery disease) (HCC)    Stasis dermatitis    Stroke (HCC)    Vitamin D deficiency    Past Surgical History:  Procedure Laterality Date   ABDOMINAL AORTOGRAM N/A 01/13/2018   Procedure: ABDOMINAL AORTOGRAM;  Surgeon: Elder Negus, MD;  Location: [facility];  Service: Cardiovascular;  Laterality: N/A;   CATARACT EXTRACTION, BILATERAL     LEFT HEART CATH AND CORONARY ANGIOGRAPHY N/A 01/13/2018   Procedure: LEFT HEART CATH AND CORONARY ANGIOGRAPHY;  Surgeon: Elder Negus, MD;  Location: [facility];  Service: Cardiovascular;  Laterality: N/A;   LOWER EXTREMITY ANGIOGRAPHY N/A 01/13/2018   Procedure: LOWER EXTREMITY ANGIOGRAPHY;  Surgeon: Elder Negus, MD;  Location: [facility];  Service: Cardiovascular;  Laterality: N/A;   LOWER EXTREMITY ANGIOGRAPHY Right 01/27/2018   Procedure: LOWER EXTREMITY ANGIOGRAPHY;  Surgeon: Yates Decamp, MD;  Location: [facility];  Service: Cardiovascular;  Laterality:  Right;   PERIPHERAL VASCULAR ATHERECTOMY Left 01/13/2018   Procedure: PERIPHERAL VASCULAR ATHERECTOMY;  Surgeon: Elder Negus, MD;  Location: [facility];  Service: Cardiovascular;  Laterality: Left;  SFA WITH PTA DRUG COATED BALLOON   PERIPHERAL VASCULAR INTERVENTION  01/27/2018   Procedure: PERIPHERAL VASCULAR INTERVENTION;  Surgeon: Yates Decamp, MD;  Location: [facility];  Service: Cardiovascular;;   Patient Active Problem List   Diagnosis Date Noted   Leg edema 08/01/2021   Mild cognitive impairment 09/07/2020   Chronic pain of left knee 03/15/2020   Pure hypercholesterolemia 11/15/2019   First degree AV block 07/28/2019   Hypertriglyceridemia 04/29/2019   Mixed hyperlipidemia 04/29/2019   CVA (cerebral vascular accident) (HCC)    Bradycardia    Subclinical hyperthyroidism 11/18/2018   Peripheral artery disease (HCC) 06/13/2018   Ischemic cerebrovascular accident (CVA) (HCC) 06/13/2018   Dysarthria 06/12/2018   Renal insufficiency 06/12/2018   Claudication (HCC) 01/11/2018   Anemia 10/10/2017   Essential hypertension 11/04/2013    REFERRING DIAG: M17.12 (ICD-10-CM) - Unilateral primary osteoarthritis, left knee   THERAPY DIAG:  Chronic pain of left knee  Muscle weakness (generalized)  Rationale for Evaluation and Treatment Rehabilitation  PERTINENT HISTORY: CVA, DVT, HTN   PRECAUTIONS: None  SUBJECTIVE:  SUBJECTIVE STATEMENT:  Pt presents to PT with reports of continued severe L knee pain. Notes that he went to his primary care physician last week and they found nothing wrong. Pt is ready to begin PT at this time.    PAIN:  Are you having pain?  Yes: NPRS scale: 10/10 Pain location: left medial knee Pain description: sharp Aggravating factors: walking,  stairs Relieving factors: rest, medication   OBJECTIVE: (objective measures completed at initial evaluation unless otherwise dated)   DIAGNOSTIC FINDINGS:             Three-view x-rays show severe tricompartmental osteoarthritis.  Medial  compartment is bone-on-bone with varus deformity.    PATIENT SURVEYS:  FOTO: 43% function; 54% predicted    COGNITION: Overall cognitive status: Within functional limits for tasks assessed                         SENSATION: WFL   POSTURE: rounded shoulders and forward head   PALPATION: TTP to medial L knee, distal L quad   LOWER EXTREMITY ROM:   Active ROM Right eval Left eval  Hip flexion      Hip extension      Hip abduction      Hip adduction      Hip internal rotation      Hip external rotation      Knee flexion WFL 95  Knee extension WFL 5  Ankle dorsiflexion      Ankle plantarflexion      Ankle inversion      Ankle eversion       (Blank rows = not tested)   LOWER EXTREMITY MMT:   MMT Right eval Left eval  Hip flexion 4/5 4/5  Hip extension      Hip abduction 3+/5 3+/5  Hip adduction      Hip internal rotation      Hip external rotation      Knee flexion 4/5 4/5  Knee extension 4/5 3+/5  Ankle dorsiflexion      Ankle plantarflexion      Ankle inversion      Ankle eversion       (Blank rows = not tested)   LOWER EXTREMITY SPECIAL TESTS:  DNT   FUNCTIONAL TESTS:  30 Second Sit to Stand: 9 reps - with UE support   GAIT: Distance walked: 15ft Assistive device utilized: Single point cane Level of assistance: Modified independence Comments: antalgic gait L, decreased L knee flexion     TREATMENT: [facility] Adult PT Treatment:                                                DATE: 11/19/2022 Therapeutic Exercise: Nustep level 5 x 5 mins while gathering subjective Seated hamstring curl 2x10 Black TB LAQ 3x10 3# STS 2x10 - high table Seated clamshell 3x15 Black TB Seated pilates ring squeeze 2x15 - 3"  hold Seated hamstring stretch 2x30" L  [facility] Adult PT Treatment:                                                DATE: 11/14/2022 Therapeutic Exercise: Nustep level 5 x 5 mins while gathering subjective Seated hamstring  curl 2x10 BTB LAQ 3x10 3# STS 2x10 - high table Seated clamshell 3x20 BTB Seated hamstring stretch 2x30" L Seated heel slide L 2x15 - 5" hold Supine quad set x 10 -3" hold Supine SLR 2x10 Seated pilates ring squeeze 2x15 - 3" hold Standing hip abd/ext 2x10  [facility] Adult PT Treatment:                                                DATE: 11/12/2022 Therapeutic Exercise: Nustep level 5 x 5 mins while gathering subjective Seated hamstring curl 2x10 BTB LAQ 3x10 3# STS 2x10 - high table Seated clamshell 3x20 BTB Seated hamstring stretch 2x30" L Seated heel slide L 2x10 - 5" hold Supine quad set x 10 -3" hold Supine SLR 2x10 Supine ball squeeze 2x10 - 3" hold  [facility] Adult PT Treatment:                                                DATE: 11/06/2022 Therapeutic Exercise: Nustep level 5 x 5 mins while gathering subjective Seated marching 2x15 BIL LAQ 2x10 2# Seated hamstring curl GTB 2x15 Supine SLR 2x10 each Seated clamshell GTB 3x15 Seated hamstring stretch 2x30" L Seated heel slide L 2x10 - 5" hold STS 2x10 Leg press x 10 - 35#  [facility] Adult PT Treatment:                                                DATE: 11/04/2022 Therapeutic Exercise: Nustep level 5 x 5 mins while gathering subjective Seated marching 2x15 BIL LAQ 2x10 Lt LAQ with hip adduction ball squeeze Lt 2x10 Seated hamstring curl GTB 2x10 Lt Supine clamshell GTB 2x10 Supine marching GTB 2x10 BIL Supine hip adduction ball squeeze 5" hold 2x10  QS Lt 5" hold 2x10 Bridges x10 (Lt hamstring cramp) SLR Lt 2x10 STS 2x10 Gait with [facility] x185' rolled into therex   [facility] Adult PT Treatment:                                                DATE: 10/30/2022 Therapeutic Exercise: Supine quad set x 5 - 5"  hold Supine SLR x 5 L Seated clamshell x 15 BTB STS x 10    PATIENT EDUCATION:  Education details: continue HEP Person educated: Patient Education method: Explanation, Demonstration, and Handouts Education comprehension: verbalized understanding and returned demonstration   HOME EXERCISE PROGRAM: Access Code: [email or web address] URL: [email or web address] Date: 10/30/2022 Prepared by: Edwinna Areola   Exercises - Supine Quadricep Sets  - 1 x daily - 7 x weekly - 2 sets - 10 reps - 5 sec hold - Active Straight Leg Raise with Quad Set  - 1 x daily - 7 x weekly - 2 sets - 10 reps - Seated Hip Abduction with Resistance  - 1 x daily - 7 x weekly - 3 sets - 10 reps - blue theraband hold - Sit to Stand  - 1 x daily - 7  x weekly - 2 sets - 10 reps   ASSESSMENT:   CLINICAL IMPRESSION: Pt was able to complete all prescribed exercises with no adverse effect. Therapy today again focused on improving quad and proximal hip strength in order to decrease pain and improve functional mobility. He continues to benefit from skilled PT services, will continue per POC as prescribed.   OBJECTIVE IMPAIRMENTS: Abnormal gait, decreased activity tolerance, decreased balance, decreased mobility, difficulty walking, decreased ROM, decreased strength, and pain.    ACTIVITY LIMITATIONS: carrying, lifting, bending, standing, squatting, stairs, transfers, and locomotion level   PARTICIPATION LIMITATIONS: driving, community activity, occupation, and yard work   PERSONAL FACTORS: Fitness, Time since onset of injury/illness/exacerbation, and 3+ comorbidities: CVA, DVT, HTN  are also affecting patient's functional outcome.      GOALS: Goals reviewed with patient? No   SHORT TERM GOALS: Target date: 11/20/2022   Pt will be compliant and knowledgeable with initial HEP for improved comfort and carryover Baseline: initial HEP given  Goal status: INITIAL   2.  Pt will self report left knee pain no greater than  7/10 for improved comfort and functional ability Baseline: 10/10 at worst Goal status: INITIAL    LONG TERM GOALS: Target date: 12/25/2022   Pt will self report left knee pain no greater than 3/10 for improved comfort and functional ability Baseline: 10/10 at worst Goal status: INITIAL    2.  Pt will improve FOTO function score to no less than 54% as proxy for functional improvement Baseline: 43% function Goal status: INITIAL    3.  Pt will increase 30 Second Sit to Stand rep count to no less than 11 reps for improved balance, strength, and functional mobility Baseline: 9 reps with UE Goal status: INITIAL    4.  Pt will improve L knee ROM to no less than 3-105 for improved comfort and functional mobility Baseline: 5-95 Goal status: INITIAL     PLAN:   PT FREQUENCY: 2x/week   PT DURATION: 8 weeks   PLANNED INTERVENTIONS: Therapeutic exercises, Therapeutic activity, Neuromuscular re-education, Balance training, Gait training, Patient/Family education, Self Care, Joint mobilization, Aquatic Therapy, Dry Needling, Electrical stimulation, Cryotherapy, Moist heat, Vasopneumatic device, Manual therapy, and Re-evaluation   PLAN FOR NEXT SESSION: assess HEP response, quad and hip strengthening, gait training    Eloy End, PT 11/19/2022, 2:44 PM

## 2022-11-21 ENCOUNTER — Ambulatory Visit: Payer: Medicare HMO

## 2022-11-21 DIAGNOSIS — M6281 Muscle weakness (generalized): Secondary | ICD-10-CM

## 2022-11-21 DIAGNOSIS — R2689 Other abnormalities of gait and mobility: Secondary | ICD-10-CM

## 2022-11-21 DIAGNOSIS — R600 Localized edema: Secondary | ICD-10-CM

## 2022-11-21 DIAGNOSIS — G8929 Other chronic pain: Secondary | ICD-10-CM

## 2022-11-21 DIAGNOSIS — M25562 Pain in left knee: Secondary | ICD-10-CM | POA: Diagnosis not present

## 2022-11-21 NOTE — Therapy (Incomplete)
OUTPATIENT PHYSICAL THERAPY TREATMENT NOTE   Patient Name: Jesus Lam MRN: [id number] DOB:10-12-1933, 88 y.o., male Today's Date: 11/21/2022  PCP: Unknown  REFERRING PROVIDER: Roswell Miners, MD   END OF SESSION:         Past Medical History:  Diagnosis Date   Anemia    BPH (benign prostatic hyperplasia)    Cataracts, bilateral    CVA (cerebral vascular accident) (HCC)    DVT (deep venous thrombosis) (HCC)    dvt in left leg   Dyslipidemia    Gout    Gout    HTN (hypertension)    Left hip pain 03/15/2020   Left leg pain    Osteoarthritis    PAD (peripheral artery disease) (HCC)    Stasis dermatitis    Stroke (HCC)    Vitamin D deficiency    Past Surgical History:  Procedure Laterality Date   ABDOMINAL AORTOGRAM N/A 01/13/2018   Procedure: ABDOMINAL AORTOGRAM;  Surgeon: Elder Negus, MD;  Location: [facility];  Service: Cardiovascular;  Laterality: N/A;   CATARACT EXTRACTION, BILATERAL     LEFT HEART CATH AND CORONARY ANGIOGRAPHY N/A 01/13/2018   Procedure: LEFT HEART CATH AND CORONARY ANGIOGRAPHY;  Surgeon: Elder Negus, MD;  Location: [facility];  Service: Cardiovascular;  Laterality: N/A;   LOWER EXTREMITY ANGIOGRAPHY N/A 01/13/2018   Procedure: LOWER EXTREMITY ANGIOGRAPHY;  Surgeon: Elder Negus, MD;  Location: [facility];  Service: Cardiovascular;  Laterality: N/A;   LOWER EXTREMITY ANGIOGRAPHY Right 01/27/2018   Procedure: LOWER EXTREMITY ANGIOGRAPHY;  Surgeon: Yates Decamp, MD;  Location: [facility];  Service: Cardiovascular;  Laterality: Right;   PERIPHERAL VASCULAR ATHERECTOMY Left 01/13/2018   Procedure: PERIPHERAL VASCULAR ATHERECTOMY;  Surgeon: Elder Negus, MD;  Location: [facility];  Service: Cardiovascular;  Laterality: Left;  SFA WITH PTA DRUG COATED BALLOON   PERIPHERAL VASCULAR INTERVENTION  01/27/2018   Procedure: PERIPHERAL VASCULAR INTERVENTION;  Surgeon: Yates Decamp, MD;   Location: [facility];  Service: Cardiovascular;;   Patient Active Problem List   Diagnosis Date Noted   Leg edema 08/01/2021   Mild cognitive impairment 09/07/2020   Chronic pain of left knee 03/15/2020   Pure hypercholesterolemia 11/15/2019   First degree AV block 07/28/2019   Hypertriglyceridemia 04/29/2019   Mixed hyperlipidemia 04/29/2019   CVA (cerebral vascular accident) (HCC)    Bradycardia    Subclinical hyperthyroidism 11/18/2018   Peripheral artery disease (HCC) 06/13/2018   Ischemic cerebrovascular accident (CVA) (HCC) 06/13/2018   Dysarthria 06/12/2018   Renal insufficiency 06/12/2018   Claudication (HCC) 01/11/2018   Anemia 10/10/2017   Essential hypertension 11/04/2013    REFERRING DIAG: M17.12 (ICD-10-CM) - Unilateral primary osteoarthritis, left knee   THERAPY DIAG:  No diagnosis found.  Rationale for Evaluation and Treatment Rehabilitation  PERTINENT HISTORY: CVA, DVT, HTN   PRECAUTIONS: None  SUBJECTIVE:  SUBJECTIVE STATEMENT: *** Pt presents to PT with reports of continued severe L knee pain. Notes that he went to his primary care physician last week and they found nothing wrong. Pt is ready to begin PT at this time.    PAIN:  Are you having pain?  Yes: NPRS scale: ***10/10 Pain location: left medial knee Pain description: sharp Aggravating factors: walking, stairs Relieving factors: rest, medication   OBJECTIVE: (objective measures completed at initial evaluation unless otherwise dated)   DIAGNOSTIC FINDINGS:             Three-view x-rays show severe tricompartmental osteoarthritis.  Medial  compartment is bone-on-bone with varus deformity.    PATIENT SURVEYS:  FOTO: 43% function; 54% predicted  11/21/2022: ***   COGNITION: Overall cognitive status:  Within functional limits for tasks assessed                         SENSATION: WFL   POSTURE: rounded shoulders and forward head   PALPATION: TTP to medial L knee, distal L quad   LOWER EXTREMITY ROM:   Active ROM Right eval Left eval  Hip flexion      Hip extension      Hip abduction      Hip adduction      Hip internal rotation      Hip external rotation      Knee flexion WFL 95  Knee extension WFL 5  Ankle dorsiflexion      Ankle plantarflexion      Ankle inversion      Ankle eversion       (Blank rows = not tested)   LOWER EXTREMITY MMT:   MMT Right eval Left eval  Hip flexion 4/5 4/5  Hip extension      Hip abduction 3+/5 3+/5  Hip adduction      Hip internal rotation      Hip external rotation      Knee flexion 4/5 4/5  Knee extension 4/5 3+/5  Ankle dorsiflexion      Ankle plantarflexion      Ankle inversion      Ankle eversion       (Blank rows = not tested)   LOWER EXTREMITY SPECIAL TESTS:  DNT   FUNCTIONAL TESTS:  30 Second Sit to Stand: 9 reps - with UE support   GAIT: Distance walked: 33ft Assistive device utilized: Single point cane Level of assistance: Modified independence Comments: antalgic gait L, decreased L knee flexion     TREATMENT: [facility] Adult PT Treatment:                                                DATE: 11/21/2022 Therapeutic Exercise: Nustep level 5 x 5 mins while gathering subjective FOTO*** Omega knee flexion 15# 3x10 Omega knee extension? 5# 2x10 LAQ 3x10 3# STS 2x10 - high table Seated clamshell 3x15 Black TB Seated pilates ring squeeze 2x15 - 3" hold Seated hamstring stretch 2x30" L Supine SLR 2x10  [facility] Adult PT Treatment:                                                DATE: 11/19/2022 Therapeutic Exercise: Nustep level  5 x 5 mins while gathering subjective Seated hamstring curl 2x10 Black TB LAQ 3x10 3# STS 2x10 - high table Seated clamshell 3x15 Black TB Seated pilates ring squeeze 2x15 - 3"  hold Seated hamstring stretch 2x30" L  [facility] Adult PT Treatment:                                                DATE: 11/14/2022 Therapeutic Exercise: Nustep level 5 x 5 mins while gathering subjective Seated hamstring curl 2x10 BTB LAQ 3x10 3# STS 2x10 - high table Seated clamshell 3x20 BTB Seated hamstring stretch 2x30" L Seated heel slide L 2x15 - 5" hold Supine quad set x 10 -3" hold Supine SLR 2x10 Seated pilates ring squeeze 2x15 - 3" hold Standing hip abd/ext 2x10  [facility] Adult PT Treatment:                                                DATE: 11/12/2022 Therapeutic Exercise: Nustep level 5 x 5 mins while gathering subjective Seated hamstring curl 2x10 BTB LAQ 3x10 3# STS 2x10 - high table Seated clamshell 3x20 BTB Seated hamstring stretch 2x30" L Seated heel slide L 2x10 - 5" hold Supine quad set x 10 -3" hold Supine SLR 2x10 Supine ball squeeze 2x10 - 3" hold   PATIENT EDUCATION:  Education details: continue HEP Person educated: Patient Education method: Explanation, Demonstration, and Handouts Education comprehension: verbalized understanding and returned demonstration   HOME EXERCISE PROGRAM: Access Code: [email or web address] URL: [email or web address] Date: 10/30/2022 Prepared by: Edwinna Areola   Exercises - Supine Quadricep Sets  - 1 x daily - 7 x weekly - 2 sets - 10 reps - 5 sec hold - Active Straight Leg Raise with Quad Set  - 1 x daily - 7 x weekly - 2 sets - 10 reps - Seated Hip Abduction with Resistance  - 1 x daily - 7 x weekly - 3 sets - 10 reps - blue theraband hold - Sit to Stand  - 1 x daily - 7 x weekly - 2 sets - 10 reps   ASSESSMENT:   CLINICAL IMPRESSION: *** Patient presents to PT  Pt was able to complete all prescribed exercises with no adverse effect. Therapy today again focused on improving quad and proximal hip strength in order to decrease pain and improve functional mobility. He continues to benefit from skilled PT services, will  continue per POC as prescribed.   OBJECTIVE IMPAIRMENTS: Abnormal gait, decreased activity tolerance, decreased balance, decreased mobility, difficulty walking, decreased ROM, decreased strength, and pain.    ACTIVITY LIMITATIONS: carrying, lifting, bending, standing, squatting, stairs, transfers, and locomotion level   PARTICIPATION LIMITATIONS: driving, community activity, occupation, and yard work   PERSONAL FACTORS: Fitness, Time since onset of injury/illness/exacerbation, and 3+ comorbidities: CVA, DVT, HTN  are also affecting patient's functional outcome.      GOALS: Goals reviewed with patient? No   SHORT TERM GOALS: Target date: 11/20/2022   Pt will be compliant and knowledgeable with initial HEP for improved comfort and carryover Baseline: initial HEP given  Goal status: INITIAL   2.  Pt will self report left knee pain no greater than 7/10 for improved comfort and functional ability Baseline: 10/10 at  worst Goal status: INITIAL    LONG TERM GOALS: Target date: 12/25/2022   Pt will self report left knee pain no greater than 3/10 for improved comfort and functional ability Baseline: 10/10 at worst Goal status: INITIAL    2.  Pt will improve FOTO function score to no less than 54% as proxy for functional improvement Baseline: 43% function Goal status: INITIAL    3.  Pt will increase 30 Second Sit to Stand rep count to no less than 11 reps for improved balance, strength, and functional mobility Baseline: 9 reps with UE Goal status: INITIAL    4.  Pt will improve L knee ROM to no less than 3-105 for improved comfort and functional mobility Baseline: 5-95 Goal status: INITIAL     PLAN:   PT FREQUENCY: 2x/week   PT DURATION: 8 weeks   PLANNED INTERVENTIONS: Therapeutic exercises, Therapeutic activity, Neuromuscular re-education, Balance training, Gait training, Patient/Family education, Self Care, Joint mobilization, Aquatic Therapy, Dry Needling, Electrical  stimulation, Cryotherapy, Moist heat, Vasopneumatic device, Manual therapy, and Re-evaluation   PLAN FOR NEXT SESSION: assess HEP response, quad and hip strengthening, gait training    Berta Minor, PTA 11/21/2022, 8:54 AM

## 2022-11-21 NOTE — Therapy (Addendum)
OUTPATIENT PHYSICAL THERAPY TREATMENT NOTE/DC   Patient Name: Jesus Lam MRN: [id number] DOB:1933-08-25, 88 y.o., male Today's Date: 11/21/2022  PCP: Unknown  REFERRING PROVIDER: Roswell Miners, MD   END OF SESSION:   PT End of Session - 11/21/22 1535     Visit Number 7    Number of Visits 17    Date for PT Re-Evaluation 12/25/22    Authorization Type [employer] MCR    PT Start Time 1515    PT Stop Time 1545    PT Time Calculation (min) 30 min    Activity Tolerance Patient tolerated treatment well    Behavior During Therapy WFL for tasks assessed/performed                 Past Medical History:  Diagnosis Date   Anemia    BPH (benign prostatic hyperplasia)    Cataracts, bilateral    CVA (cerebral vascular accident) (HCC)    DVT (deep venous thrombosis) (HCC)    dvt in left leg   Dyslipidemia    Gout    Gout    HTN (hypertension)    Left hip pain 03/15/2020   Left leg pain    Osteoarthritis    PAD (peripheral artery disease) (HCC)    Stasis dermatitis    Stroke (HCC)    Vitamin D deficiency    Past Surgical History:  Procedure Laterality Date   ABDOMINAL AORTOGRAM N/A 01/13/2018   Procedure: ABDOMINAL AORTOGRAM;  Surgeon: Elder Negus, MD;  [facility]: [facility];  Service: Cardiovascular;  Laterality: N/A;   CATARACT EXTRACTION, BILATERAL     LEFT HEART CATH AND CORONARY ANGIOGRAPHY N/A 01/13/2018   Procedure: LEFT HEART CATH AND CORONARY ANGIOGRAPHY;  Surgeon: Elder Negus, MD;  Location: [facility];  Service: Cardiovascular;  Laterality: N/A;   LOWER EXTREMITY ANGIOGRAPHY N/A 01/13/2018   Procedure: LOWER EXTREMITY ANGIOGRAPHY;  Surgeon: Elder Negus, MD;  Location: [facility];  Service: Cardiovascular;  Laterality: N/A;   LOWER EXTREMITY ANGIOGRAPHY Right 01/27/2018   Procedure: LOWER EXTREMITY ANGIOGRAPHY;  Surgeon: Yates Decamp, MD;  [facility]: [facility];  Service: Cardiovascular;  Laterality: Right;    PERIPHERAL VASCULAR ATHERECTOMY Left 01/13/2018   Procedure: PERIPHERAL VASCULAR ATHERECTOMY;  Surgeon: Elder Negus, MD;  [facility]: [facility];  Service: Cardiovascular;  Laterality: Left;  SFA WITH PTA DRUG COATED BALLOON   PERIPHERAL VASCULAR INTERVENTION  01/27/2018   Procedure: PERIPHERAL VASCULAR INTERVENTION;  Surgeon: Yates Decamp, MD;  Location: [facility];  Service: Cardiovascular;;   Patient Active Problem List   Diagnosis Date Noted   Leg edema 08/01/2021   Mild cognitive impairment 09/07/2020   Chronic pain of left knee 03/15/2020   Pure hypercholesterolemia 11/15/2019   First degree AV block 07/28/2019   Hypertriglyceridemia 04/29/2019   Mixed hyperlipidemia 04/29/2019   CVA (cerebral vascular accident) (HCC)    Bradycardia    Subclinical hyperthyroidism 11/18/2018   Peripheral artery disease (HCC) 06/13/2018   Ischemic cerebrovascular accident (CVA) (HCC) 06/13/2018   Dysarthria 06/12/2018   Renal insufficiency 06/12/2018   Claudication (HCC) 01/11/2018   Anemia 10/10/2017   Essential hypertension 11/04/2013    REFERRING DIAG: M17.12 (ICD-10-CM) - Unilateral primary osteoarthritis, left knee   THERAPY DIAG:  Chronic pain of left knee  Muscle weakness (generalized)  Other abnormalities of gait and mobility  Localized edema  Rationale for Evaluation and Treatment Rehabilitation  PERTINENT HISTORY: CVA, DVT, HTN   PRECAUTIONS: None  SUBJECTIVE:                                                                                                                                                                                      SUBJECTIVE STATEMENT:  Pt continues to report a high level of L knee pain.   PAIN:  Are you having pain?  Yes: NPRS scale: 10/10 Pain location: left medial knee Pain description: sharp Aggravating factors: walking, stairs Relieving factors: rest, medication   OBJECTIVE: (objective measures completed at initial  evaluation unless otherwise dated)   DIAGNOSTIC FINDINGS:             Three-view x-rays show severe tricompartmental osteoarthritis.  Medial  compartment is bone-on-bone with varus deformity.    PATIENT SURVEYS:  FOTO: 43% function; 54% predicted    COGNITION: Overall cognitive status: Within functional limits for tasks assessed                         SENSATION: WFL   POSTURE: rounded shoulders and forward head   PALPATION: TTP to medial L knee, distal L quad   LOWER EXTREMITY ROM:   Active ROM Right eval Left eval  Hip flexion      Hip extension      Hip abduction      Hip adduction      Hip internal rotation      Hip external rotation      Knee flexion WFL 95  Knee extension WFL 5  Ankle dorsiflexion      Ankle plantarflexion      Ankle inversion      Ankle eversion       (Blank rows = not tested)   LOWER EXTREMITY MMT:   MMT Right eval Left eval  Hip flexion 4/5 4/5  Hip extension      Hip abduction 3+/5 3+/5  Hip adduction      Hip internal rotation      Hip external rotation      Knee flexion 4/5 4/5  Knee extension 4/5 3+/5  Ankle dorsiflexion      Ankle plantarflexion      Ankle inversion      Ankle eversion       (Blank rows = not tested)   LOWER EXTREMITY SPECIAL TESTS:  DNT   FUNCTIONAL TESTS:  30 Second Sit to Stand: 9 reps - with UE support   GAIT: Distance walked: 56ft Assistive device utilized: Single point cane Level of assistance: Modified independence Comments: antalgic gait L, decreased L knee flexion  TREATMENT: [facility] Adult PT Treatment:  DATE: 11/21/2022 Therapeutic Exercise: Nustep level 5 x 6 mins while gathering subjective Seated hamstring curl 2x10 Black TB LAQ 3x10 3# STS 2x10 - high table Seated clamshell 3x15 Black TB Seated pilates ring squeeze 2x15 - 3" hold Seated hamstring stretch 2x30" L    TREATMENT: [facility] Adult PT Treatment:                                                 DATE: 11/19/2022 Therapeutic Exercise: Nustep level 5 x 6 mins while gathering subjective Seated hamstring curl 2x10 Black TB LAQ 3x10 3# STS 2x10 - high table Seated clamshell 3x15 Black TB Seated pilates ring squeeze 2x15 - 3" hold Seated hamstring stretch 2x30" L  [facility] Adult PT Treatment:                                                DATE: 11/14/2022 Therapeutic Exercise: Nustep level 5 x 5 mins while gathering subjective Seated hamstring curl 2x10 BTB LAQ 3x10 3# STS 2x10 - high table Seated clamshell 3x20 BTB Seated hamstring stretch 2x30" L Seated heel slide L 2x15 - 5" hold Supine quad set x 10 -3" hold Supine SLR 2x10 Seated pilates ring squeeze 2x15 - 3" hold Standing hip abd/ext 2x10  [facility] Adult PT Treatment:                                                DATE: 11/12/2022 Therapeutic Exercise: Nustep level 5 x 5 mins while gathering subjective Seated hamstring curl 2x10 BTB LAQ 3x10 3# STS 2x10 - high table Seated clamshell 3x20 BTB Seated hamstring stretch 2x30" L Seated heel slide L 2x10 - 5" hold Supine quad set x 10 -3" hold Supine SLR 2x10 Supine ball squeeze 2x10 - 3" hold   PATIENT EDUCATION:  Education details: continue HEP Person educated: Patient Education method: Explanation, Demonstration, and Handouts Education comprehension: verbalized understanding and returned demonstration   HOME EXERCISE PROGRAM: Access Code: [email or web address] URL: [email or web address] Date: 10/30/2022 Prepared by: Edwinna Areola   Exercises - Supine Quadricep Sets  - 1 x daily - 7 x weekly - 2 sets - 10 reps - 5 sec hold - Active Straight Leg Raise with Quad Set  - 1 x daily - 7 x weekly - 2 sets - 10 reps - Seated Hip Abduction with Resistance  - 1 x daily - 7 x weekly - 3 sets - 10 reps - blue theraband hold - Sit to Stand  - 1 x daily - 7 x weekly - 2 sets - 10 reps   ASSESSMENT:   CLINICAL IMPRESSION: Today;s treatmemt session was  shortened due to pt being late related to public transportation issues. PT was completed for LE strengthening and hamstring flexibility. Pt participated with good effort and tolerated PT today without adverse effects. Pt will continue to benefit from skilled PT to address impairments for improved function of the L knee/LE.  OBJECTIVE IMPAIRMENTS: Abnormal gait, decreased activity tolerance, decreased balance, decreased mobility, difficulty walking, decreased ROM, decreased strength, and pain.    ACTIVITY LIMITATIONS: carrying, lifting, bending, standing,  squatting, stairs, transfers, and locomotion level   PARTICIPATION LIMITATIONS: driving, community activity, occupation, and yard work   PERSONAL FACTORS: Fitness, Time since onset of injury/illness/exacerbation, and 3+ comorbidities: CVA, DVT, HTN  are also affecting patient's functional outcome.      GOALS: Goals reviewed with patient? No   SHORT TERM GOALS: Target date: 11/20/2022   Pt will be compliant and knowledgeable with initial HEP for improved comfort and carryover Baseline: initial HEP given  Goal status: Met- pt returned demonstration of his HEP   2.  Pt will self report left knee pain no greater than 7/10 for improved comfort and functional ability Baseline: 10/10 at worst Goal status:  Not met   LONG TERM GOALS: Target date: 12/25/2022   Pt will self report left knee pain no greater than 3/10 for improved comfort and functional ability Baseline: 10/10 at worst Goal status: INITIAL    2.  Pt will improve FOTO function score to no less than 54% as proxy for functional improvement Baseline: 43% function Goal status: INITIAL    3.  Pt will increase 30 Second Sit to Stand rep count to no less than 11 reps for improved balance, strength, and functional mobility Baseline: 9 reps with UE Goal status: INITIAL    4.  Pt will improve L knee ROM to no less than 3-105 for improved comfort and functional mobility Baseline:  5-95 Goal status: INITIAL     PLAN:   PT FREQUENCY: 2x/week   PT DURATION: 8 weeks   PLANNED INTERVENTIONS: Therapeutic exercises, Therapeutic activity, Neuromuscular re-education, Balance training, Gait training, Patient/Family education, Self Care, Joint mobilization, Aquatic Therapy, Dry Needling, Electrical stimulation, Cryotherapy, Moist heat, Vasopneumatic device, Manual therapy, and Re-evaluation   PLAN FOR NEXT SESSION: assess HEP response, quad and hip strengthening, gait training    [employer] MS, PT 11/21/22 5:09 PM  PHYSICAL THERAPY DISCHARGE SUMMARY  Visits from Start of Care: 7  Current functional level related to goals / functional outcomes unknown   Remaining deficits: unknown   Education / Equipment: HEP  Patient agrees to discharge. Patient goals were not met. Patient is being discharged due to not returning since the last visit.   Teasha Murrillo MS, PT 01/08/24 4:53 PM

## 2022-11-22 ENCOUNTER — Other Ambulatory Visit: Payer: Self-pay | Admitting: Nurse Practitioner

## 2022-11-22 DIAGNOSIS — R350 Frequency of micturition: Secondary | ICD-10-CM

## 2022-11-22 DIAGNOSIS — R351 Nocturia: Secondary | ICD-10-CM

## 2022-11-26 ENCOUNTER — Ambulatory Visit: Payer: Self-pay

## 2022-11-26 ENCOUNTER — Ambulatory Visit: Payer: Medicare HMO | Attending: Family Medicine

## 2022-11-26 NOTE — Therapy (Incomplete)
OUTPATIENT PHYSICAL THERAPY TREATMENT NOTE   Patient Name: Jesus Lam MRN: [id number] DOB:1933-10-07, 89 y.o., male Today's Date: 11/26/2022  PCP: Unknown  REFERRING PROVIDER: Harless Litten, MD   END OF SESSION:         Past Medical History:  Diagnosis Date   Anemia    BPH (benign prostatic hyperplasia)    Cataracts, bilateral    CVA (cerebral vascular accident) ([place])    DVT (deep venous thrombosis) ([place])    dvt in left leg   Dyslipidemia    Gout    Gout    HTN (hypertension)    Left hip pain 03/15/2020   Left leg pain    Osteoarthritis    PAD (peripheral artery disease) ([place])    Stasis dermatitis    Stroke ([place])    Vitamin D deficiency    Past Surgical History:  Procedure Laterality Date   ABDOMINAL AORTOGRAM N/A 01/13/2018   Procedure: ABDOMINAL AORTOGRAM;  Surgeon: Nigel Mormon, MD;  Location: [facility];  Service: Cardiovascular;  Laterality: N/A;   CATARACT EXTRACTION, BILATERAL     LEFT HEART CATH AND CORONARY ANGIOGRAPHY N/A 01/13/2018   Procedure: LEFT HEART CATH AND CORONARY ANGIOGRAPHY;  Surgeon: Nigel Mormon, MD;  Location: [facility];  Service: Cardiovascular;  Laterality: N/A;   LOWER EXTREMITY ANGIOGRAPHY N/A 01/13/2018   Procedure: LOWER EXTREMITY ANGIOGRAPHY;  Surgeon: Nigel Mormon, MD;  Location: [facility];  Service: Cardiovascular;  Laterality: N/A;   LOWER EXTREMITY ANGIOGRAPHY Right 01/27/2018   Procedure: LOWER EXTREMITY ANGIOGRAPHY;  Surgeon: Adrian Prows, MD;  Location: [facility];  Service: Cardiovascular;  Laterality: Right;   PERIPHERAL VASCULAR ATHERECTOMY Left 01/13/2018   Procedure: PERIPHERAL VASCULAR ATHERECTOMY;  Surgeon: Nigel Mormon, MD;  Location: [facility];  Service: Cardiovascular;  Laterality: Left;  SFA WITH PTA DRUG COATED BALLOON   PERIPHERAL VASCULAR INTERVENTION  01/27/2018   Procedure: PERIPHERAL VASCULAR INTERVENTION;  Surgeon: Adrian Prows, MD;  Location:  [facility];  Service: Cardiovascular;;   Patient Active Problem List   Diagnosis Date Noted   Leg edema 08/01/2021   Mild cognitive impairment 09/07/2020   Chronic pain of left knee 03/15/2020   Pure hypercholesterolemia 11/15/2019   First degree AV block 07/28/2019   Hypertriglyceridemia 04/29/2019   Mixed hyperlipidemia 04/29/2019   CVA (cerebral vascular accident) ([place])    Bradycardia    Subclinical hyperthyroidism 11/18/2018   Peripheral artery disease ([place]) 06/13/2018   Ischemic cerebrovascular accident (CVA) ([place]) 06/13/2018   Dysarthria 06/12/2018   Renal insufficiency 06/12/2018   Claudication ([place]) 01/11/2018   Anemia 10/10/2017   Essential hypertension 11/04/2013    REFERRING DIAG: M17.12 (ICD-10-CM) - Unilateral primary osteoarthritis, left knee   THERAPY DIAG:  No diagnosis found.  Rationale for Evaluation and Treatment Rehabilitation  PERTINENT HISTORY: CVA, DVT, HTN   PRECAUTIONS: None  SUBJECTIVE:  SUBJECTIVE STATEMENT:  *** Pt continues to report a high level of L knee pain.   PAIN:  Are you having pain?  Yes: NPRS scale: ***10/10 Pain location: left medial knee Pain description: sharp Aggravating factors: walking, stairs Relieving factors: rest, medication   OBJECTIVE: (objective measures completed at initial evaluation unless otherwise dated)   DIAGNOSTIC FINDINGS:             Three-view x-rays show severe tricompartmental osteoarthritis.  Medial  compartment is bone-on-bone with varus deformity.    PATIENT SURVEYS:  FOTO: 43% function; 54% predicted  11/26/2022: ***   COGNITION: Overall cognitive status: Within functional limits for tasks assessed                         SENSATION: WFL   POSTURE: rounded shoulders and forward head   PALPATION: TTP  to medial L knee, distal L quad   LOWER EXTREMITY ROM:   Active ROM Right eval Left eval  Hip flexion      Hip extension      Hip abduction      Hip adduction      Hip internal rotation      Hip external rotation      Knee flexion WFL 95  Knee extension WFL 5  Ankle dorsiflexion      Ankle plantarflexion      Ankle inversion      Ankle eversion       (Blank rows = not tested)   LOWER EXTREMITY MMT:   MMT Right eval Left eval  Hip flexion 4/5 4/5  Hip extension      Hip abduction 3+/5 3+/5  Hip adduction      Hip internal rotation      Hip external rotation      Knee flexion 4/5 4/5  Knee extension 4/5 3+/5  Ankle dorsiflexion      Ankle plantarflexion      Ankle inversion      Ankle eversion       (Blank rows = not tested)   LOWER EXTREMITY SPECIAL TESTS:  DNT   FUNCTIONAL TESTS:  30 Second Sit to Stand: 9 reps - with UE support   GAIT: Distance walked: 10f Assistive device utilized: Single point cane Level of assistance: Modified independence Comments: antalgic gait L, decreased L knee flexion  TREATMENT: [facility] Adult PT Treatment:                                                DATE: 11/26/2022 Therapeutic Exercise: Nustep level 5 x 6 mins while gathering subjective Seated hamstring curl 2x10 Black TB LAQ 3x10 3# STS 2x10 - high table Seated clamshell 3x15 Black TB Seated pilates ring squeeze 2x15 - 3" hold Seated hamstring stretch 2x30" L Supine SLR 2x10 Seated pilates ring squeeze 2x15 - 3" hold Standing hip abd/ext 2x10 Therapeutic Activity: FOTO***  [facility] Adult PT Treatment:                                                DATE: 11/21/2022 Therapeutic Exercise: Nustep level 5 x 6 mins while gathering subjective Seated hamstring curl 2x10 Black TB LAQ 3x10 3# STS 2x10 -  high table Seated clamshell 3x15 Black TB Seated pilates ring squeeze 2x15 - 3" hold Seated hamstring stretch 2x30" L    TREATMENT: [facility] Adult PT Treatment:                                                 DATE: 11/19/2022 Therapeutic Exercise: Nustep level 5 x 6 mins while gathering subjective Seated hamstring curl 2x10 Black TB LAQ 3x10 3# STS 2x10 - high table Seated clamshell 3x15 Black TB Seated pilates ring squeeze 2x15 - 3" hold Seated hamstring stretch 2x30" L  [facility] Adult PT Treatment:                                                DATE: 11/14/2022 Therapeutic Exercise: Nustep level 5 x 5 mins while gathering subjective Seated hamstring curl 2x10 BTB LAQ 3x10 3# STS 2x10 - high table Seated clamshell 3x20 BTB Seated hamstring stretch 2x30" L Seated heel slide L 2x15 - 5" hold Supine quad set x 10 -3" hold Supine SLR 2x10 Seated pilates ring squeeze 2x15 - 3" hold Standing hip abd/ext 2x10    PATIENT EDUCATION:  Education details: continue HEP Person educated: Patient Education method: Explanation, Demonstration, and Handouts Education comprehension: verbalized understanding and returned demonstration   HOME EXERCISE PROGRAM: Access Code: [email or web address] URL: [email or web address] Date: 10/30/2022 Prepared by: Octavio Manns   Exercises - Supine Quadricep Sets  - 1 x daily - 7 x weekly - 2 sets - 10 reps - 5 sec hold - Active Straight Leg Raise with Quad Set  - 1 x daily - 7 x weekly - 2 sets - 10 reps - Seated Hip Abduction with Resistance  - 1 x daily - 7 x weekly - 3 sets - 10 reps - blue theraband hold - Sit to Stand  - 1 x daily - 7 x weekly - 2 sets - 10 reps   ASSESSMENT:   CLINICAL IMPRESSION: ***  Today;s treatmemt session was shortened due to pt being late related to public transportation issues. PT was completed for LE strengthening and hamstring flexibility. Pt participated with good effort and tolerated PT today without adverse effects. Pt will continue to benefit from skilled PT to address impairments for improved function of the L knee/LE.  OBJECTIVE IMPAIRMENTS: Abnormal gait, decreased activity tolerance,  decreased balance, decreased mobility, difficulty walking, decreased ROM, decreased strength, and pain.    ACTIVITY LIMITATIONS: carrying, lifting, bending, standing, squatting, stairs, transfers, and locomotion level   PARTICIPATION LIMITATIONS: driving, community activity, occupation, and yard work   PERSONAL FACTORS: Fitness, Time since onset of injury/illness/exacerbation, and 3+ comorbidities: CVA, DVT, HTN  are also affecting patient's functional outcome.      GOALS: Goals reviewed with patient? No   SHORT TERM GOALS: Target date: 11/20/2022   Pt will be compliant and knowledgeable with initial HEP for improved comfort and carryover Baseline: initial HEP given  Goal status: Met- pt returned demonstration of his HEP   2.  Pt will self report left knee pain no greater than 7/10 for improved comfort and functional ability Baseline: 10/10 at worst Goal status:  Not met   LONG TERM GOALS: Target date: 12/25/2022   Pt will self report left  knee pain no greater than 3/10 for improved comfort and functional ability Baseline: 10/10 at worst Goal status: INITIAL    2.  Pt will improve FOTO function score to no less than 54% as proxy for functional improvement Baseline: 43% function Goal status: INITIAL    3.  Pt will increase 30 Second Sit to Stand rep count to no less than 11 reps for improved balance, strength, and functional mobility Baseline: 9 reps with UE Goal status: INITIAL    4.  Pt will improve L knee ROM to no less than 3-105 for improved comfort and functional mobility Baseline: 5-95 Goal status: INITIAL     PLAN:   PT FREQUENCY: 2x/week   PT DURATION: 8 weeks   PLANNED INTERVENTIONS: Therapeutic exercises, Therapeutic activity, Neuromuscular re-education, Balance training, Gait training, Patient/Family education, Self Care, Joint mobilization, Aquatic Therapy, Dry Needling, Electrical stimulation, Cryotherapy, Moist heat, Vasopneumatic device, Manual therapy,  and Re-evaluation   PLAN FOR NEXT SESSION: assess HEP response, quad and hip strengthening, gait training    Margarette Canada, PTA 11/26/22 8:53 AM

## 2022-11-26 NOTE — Patient Outreach (Signed)
  Care Coordination   11/26/2022 Name: Jesus Lam MRN: [id number] DOB: 03-04-1933   Care Coordination Outreach Attempts:  An unsuccessful telephone outreach was attempted for a scheduled appointment today.  Follow Up Plan:  No further outreach attempts will be made at this time. We have been unable to contact the patient to offer or enroll patient in care coordination services  Encounter Outcome:  No Answer   Care Coordination Interventions:  No, not indicated    Barb Merino, RN, BSN, CCM Care Management Coordinator [facility] Direct Phone: [phone]

## 2022-11-27 ENCOUNTER — Telehealth: Payer: Self-pay

## 2022-11-27 NOTE — Telephone Encounter (Signed)
Spoke with patient regarding missed visit, he missed the bus and was unable to attend. Confirmed next appointment time.  Margarette Canada, PTA 11/27/22 11:35 AM

## 2022-11-28 ENCOUNTER — Telehealth: Payer: Self-pay

## 2022-11-28 ENCOUNTER — Ambulatory Visit: Payer: Medicare HMO

## 2022-11-28 NOTE — Telephone Encounter (Signed)
PT called regarding missed visit on 11/28/2022. Patient apologized, his friends picked him up to celebrate his birthday and he forgot to call and cancel.   PT will reschedule appointment.   Ward Chatters   11/28/22 5:47 PM

## 2022-11-28 NOTE — Therapy (Incomplete)
OUTPATIENT PHYSICAL THERAPY TREATMENT NOTE   Patient Name: Jesus Lam MRN: [id number] DOB:1933/09/17, 89 y.o., male Today's Date: 11/28/2022  PCP: Unknown  REFERRING PROVIDER: Harless Litten, MD   END OF SESSION:         Past Medical History:  Diagnosis Date   Anemia    BPH (benign prostatic hyperplasia)    Cataracts, bilateral    CVA (cerebral vascular accident) ([place])    DVT (deep venous thrombosis) ([place])    dvt in left leg   Dyslipidemia    Gout    Gout    HTN (hypertension)    Left hip pain 03/15/2020   Left leg pain    Osteoarthritis    PAD (peripheral artery disease) ([place])    Stasis dermatitis    Stroke ([place])    Vitamin D deficiency    Past Surgical History:  Procedure Laterality Date   ABDOMINAL AORTOGRAM N/A 01/13/2018   Procedure: ABDOMINAL AORTOGRAM;  Surgeon: Nigel Mormon, MD;  Location: [facility];  Service: Cardiovascular;  Laterality: N/A;   CATARACT EXTRACTION, BILATERAL     LEFT HEART CATH AND CORONARY ANGIOGRAPHY N/A 01/13/2018   Procedure: LEFT HEART CATH AND CORONARY ANGIOGRAPHY;  Surgeon: Nigel Mormon, MD;  Location: [facility];  Service: Cardiovascular;  Laterality: N/A;   LOWER EXTREMITY ANGIOGRAPHY N/A 01/13/2018   Procedure: LOWER EXTREMITY ANGIOGRAPHY;  Surgeon: Nigel Mormon, MD;  Location: [facility];  Service: Cardiovascular;  Laterality: N/A;   LOWER EXTREMITY ANGIOGRAPHY Right 01/27/2018   Procedure: LOWER EXTREMITY ANGIOGRAPHY;  Surgeon: Adrian Prows, MD;  Location: [facility];  Service: Cardiovascular;  Laterality: Right;   PERIPHERAL VASCULAR ATHERECTOMY Left 01/13/2018   Procedure: PERIPHERAL VASCULAR ATHERECTOMY;  Surgeon: Nigel Mormon, MD;  Location: [facility];  Service: Cardiovascular;  Laterality: Left;  SFA WITH PTA DRUG COATED BALLOON   PERIPHERAL VASCULAR INTERVENTION  01/27/2018   Procedure: PERIPHERAL VASCULAR INTERVENTION;  Surgeon: Adrian Prows, MD;  Location:  [facility];  Service: Cardiovascular;;   Patient Active Problem List   Diagnosis Date Noted   Leg edema 08/01/2021   Mild cognitive impairment 09/07/2020   Chronic pain of left knee 03/15/2020   Pure hypercholesterolemia 11/15/2019   First degree AV block 07/28/2019   Hypertriglyceridemia 04/29/2019   Mixed hyperlipidemia 04/29/2019   CVA (cerebral vascular accident) ([place])    Bradycardia    Subclinical hyperthyroidism 11/18/2018   Peripheral artery disease ([place]) 06/13/2018   Ischemic cerebrovascular accident (CVA) ([place]) 06/13/2018   Dysarthria 06/12/2018   Renal insufficiency 06/12/2018   Claudication ([place]) 01/11/2018   Anemia 10/10/2017   Essential hypertension 11/04/2013    REFERRING DIAG: M17.12 (ICD-10-CM) - Unilateral primary osteoarthritis, left knee   THERAPY DIAG:  No diagnosis found.  Rationale for Evaluation and Treatment Rehabilitation  PERTINENT HISTORY: CVA, DVT, HTN   PRECAUTIONS: None  SUBJECTIVE:  SUBJECTIVE STATEMENT:  ***   PAIN:  Are you having pain?  Yes: NPRS scale: 10/10 Pain location: left medial knee Pain description: sharp Aggravating factors: walking, stairs Relieving factors: rest, medication   OBJECTIVE: (objective measures completed at initial evaluation unless otherwise dated)   DIAGNOSTIC FINDINGS:             Three-view x-rays show severe tricompartmental osteoarthritis.  Medial  compartment is bone-on-bone with varus deformity.    PATIENT SURVEYS:  FOTO: 43% function; 54% predicted    COGNITION: Overall cognitive status: Within functional limits for tasks assessed                         SENSATION: WFL   POSTURE: rounded shoulders and forward head   PALPATION: TTP to medial L knee, distal L quad   LOWER EXTREMITY ROM:   Active  ROM Right eval Left eval  Hip flexion      Hip extension      Hip abduction      Hip adduction      Hip internal rotation      Hip external rotation      Knee flexion WFL 95  Knee extension WFL 5  Ankle dorsiflexion      Ankle plantarflexion      Ankle inversion      Ankle eversion       (Blank rows = not tested)   LOWER EXTREMITY MMT:   MMT Right eval Left eval  Hip flexion 4/5 4/5  Hip extension      Hip abduction 3+/5 3+/5  Hip adduction      Hip internal rotation      Hip external rotation      Knee flexion 4/5 4/5  Knee extension 4/5 3+/5  Ankle dorsiflexion      Ankle plantarflexion      Ankle inversion      Ankle eversion       (Blank rows = not tested)   LOWER EXTREMITY SPECIAL TESTS:  DNT   FUNCTIONAL TESTS:  30 Second Sit to Stand: 9 reps - with UE support   GAIT: Distance walked: 23f Assistive device utilized: Single point cane Level of assistance: Modified independence Comments: antalgic gait L, decreased L knee flexion  TREATMENT: [facility] Adult PT Treatment:                                                DATE: 11/21/2022 Therapeutic Exercise: Nustep level 5 x 6 mins while gathering subjective Seated hamstring curl 2x10 Black TB LAQ 3x10 3# STS 2x10 - high table Seated clamshell 3x15 Black TB Seated pilates ring squeeze 2x15 - 3" hold Seated hamstring stretch 2x30" L    TREATMENT: [facility] Adult PT Treatment:                                                DATE: 11/19/2022 Therapeutic Exercise: Nustep level 5 x 6 mins while gathering subjective Seated hamstring curl 2x10 Black TB LAQ 3x10 3# STS 2x10 - high table Seated clamshell 3x15 Black TB Seated pilates ring squeeze 2x15 - 3" hold Seated hamstring stretch 2x30" L  [facility] Adult PT Treatment:  DATE: 11/14/2022 Therapeutic Exercise: Nustep level 5 x 5 mins while gathering subjective Seated hamstring curl 2x10 BTB LAQ 3x10 3# STS 2x10 - high  table Seated clamshell 3x20 BTB Seated hamstring stretch 2x30" L Seated heel slide L 2x15 - 5" hold Supine quad set x 10 -3" hold Supine SLR 2x10 Seated pilates ring squeeze 2x15 - 3" hold Standing hip abd/ext 2x10  [facility] Adult PT Treatment:                                                DATE: 11/12/2022 Therapeutic Exercise: Nustep level 5 x 5 mins while gathering subjective Seated hamstring curl 2x10 BTB LAQ 3x10 3# STS 2x10 - high table Seated clamshell 3x20 BTB Seated hamstring stretch 2x30" L Seated heel slide L 2x10 - 5" hold Supine quad set x 10 -3" hold Supine SLR 2x10 Supine ball squeeze 2x10 - 3" hold   PATIENT EDUCATION:  Education details: continue HEP Person educated: Patient Education method: Explanation, Demonstration, and Handouts Education comprehension: verbalized understanding and returned demonstration   HOME EXERCISE PROGRAM: Access Code: [email or web address] URL: [email or web address] Date: 10/30/2022 Prepared by: Octavio Manns   Exercises - Supine Quadricep Sets  - 1 x daily - 7 x weekly - 2 sets - 10 reps - 5 sec hold - Active Straight Leg Raise with Quad Set  - 1 x daily - 7 x weekly - 2 sets - 10 reps - Seated Hip Abduction with Resistance  - 1 x daily - 7 x weekly - 3 sets - 10 reps - blue theraband hold - Sit to Stand  - 1 x daily - 7 x weekly - 2 sets - 10 reps   ASSESSMENT:   CLINICAL IMPRESSION: ***  OBJECTIVE IMPAIRMENTS: Abnormal gait, decreased activity tolerance, decreased balance, decreased mobility, difficulty walking, decreased ROM, decreased strength, and pain.    ACTIVITY LIMITATIONS: carrying, lifting, bending, standing, squatting, stairs, transfers, and locomotion level   PARTICIPATION LIMITATIONS: driving, community activity, occupation, and yard work   PERSONAL FACTORS: Fitness, Time since onset of injury/illness/exacerbation, and 3+ comorbidities: CVA, DVT, HTN  are also affecting patient's functional outcome.       GOALS: Goals reviewed with patient? No   SHORT TERM GOALS: Target date: 11/20/2022   Pt will be compliant and knowledgeable with initial HEP for improved comfort and carryover Baseline: initial HEP given  Goal status: Met- pt returned demonstration of his HEP   2.  Pt will self report left knee pain no greater than 7/10 for improved comfort and functional ability Baseline: 10/10 at worst Goal status:  Not met   LONG TERM GOALS: Target date: 12/25/2022   Pt will self report left knee pain no greater than 3/10 for improved comfort and functional ability Baseline: 10/10 at worst Goal status: INITIAL    2.  Pt will improve FOTO function score to no less than 54% as proxy for functional improvement Baseline: 43% function Goal status: INITIAL    3.  Pt will increase 30 Second Sit to Stand rep count to no less than 11 reps for improved balance, strength, and functional mobility Baseline: 9 reps with UE Goal status: INITIAL    4.  Pt will improve L knee ROM to no less than 3-105 for improved comfort and functional mobility Baseline: 5-95 Goal status: INITIAL     PLAN:  PT FREQUENCY: 2x/week   PT DURATION: 8 weeks   PLANNED INTERVENTIONS: Therapeutic exercises, Therapeutic activity, Neuromuscular re-education, Balance training, Gait training, Patient/Family education, Self Care, Joint mobilization, Aquatic Therapy, Dry Needling, Electrical stimulation, Cryotherapy, Moist heat, Vasopneumatic device, Manual therapy, and Re-evaluation   PLAN FOR NEXT SESSION: assess HEP response, quad and hip strengthening, gait training    Ward Chatters PT  11/28/22 9:44 AM

## 2022-11-30 DIAGNOSIS — I10 Essential (primary) hypertension: Secondary | ICD-10-CM | POA: Diagnosis not present

## 2022-12-03 NOTE — Telephone Encounter (Signed)
Chmg-error.  

## 2022-12-05 ENCOUNTER — Other Ambulatory Visit: Payer: Self-pay | Admitting: Nurse Practitioner

## 2022-12-05 DIAGNOSIS — J310 Chronic rhinitis: Secondary | ICD-10-CM

## 2022-12-06 ENCOUNTER — Other Ambulatory Visit: Payer: Self-pay | Admitting: Nurse Practitioner

## 2022-12-06 DIAGNOSIS — J310 Chronic rhinitis: Secondary | ICD-10-CM

## 2022-12-24 ENCOUNTER — Telehealth: Payer: Self-pay

## 2022-12-24 NOTE — Telephone Encounter (Signed)
Patient approved for gel injection, but would need to resubmit due to last submission being done in 2023.

## 2022-12-31 DIAGNOSIS — I10 Essential (primary) hypertension: Secondary | ICD-10-CM | POA: Diagnosis not present

## 2023-01-03 ENCOUNTER — Ambulatory Visit: Payer: Medicare HMO | Admitting: Internal Medicine

## 2023-01-03 NOTE — Progress Notes (Deleted)
Name: Jesus Lam  MRN/ DOB: [id number], Jul 30, 1933    Age/ Sex: 89 y.o., male    PCP: System, Provider Not In   Reason for Endocrinology Evaluation: Subclinical hyperthyroidism     Date of Initial Endocrinology Evaluation: 08/20/2022    HPI: [name] is a 89 y.o. male with a past medical history of HTN, PVD, Hx of CVA. The patient presented for initial endocrinology clinic visit on 08/20/2022 for consultative assistance with his subclinical hyperthyroidism.   Patient has been noted with low TSH since December 2019 with a nadir of 0.018 u IU/mL, with normal T4 and T3.  Patient with normal TPO antibodies as well as undetectable TRAb  Thyroid ultrasound 01/2019 revealed multiple thyroid nodules  He is s/p benign FNA of the left mid 7.2 cm and right mid 2.5 cm nodules in May 2020  Methimazole was started in 10/2018  Pt not on Amiodarone      SUBJECTIVE:    Today (01/03/23):  Mr.Jesus Lam    Weight continues to decrease  Denies palpitations  Has chronic hand tremors  Denies local neck swelling , denies dysphagia  Denies loose stools or diarrhea    Methimazole 10 mg daily      HISTORY:  Past Medical History:  Past Medical History:  Diagnosis Date   Anemia    BPH (benign prostatic hyperplasia)    Cataracts, bilateral    CVA (cerebral vascular accident) ([place])    DVT (deep venous thrombosis) ([place])    dvt in left leg   Dyslipidemia    Gout    Gout    HTN (hypertension)    Left hip pain 03/15/2020   Left leg pain    Osteoarthritis    PAD (peripheral artery disease) ([facility])    Stasis dermatitis    Stroke ([place])    Vitamin D deficiency    Past Surgical History:  Past Surgical History:  Procedure Laterality Date   ABDOMINAL AORTOGRAM N/A 01/13/2018   Procedure: ABDOMINAL AORTOGRAM;  Surgeon: Nigel Mormon, MD;  Location: [facility];  Service: Cardiovascular;  Laterality: N/A;   CATARACT EXTRACTION, BILATERAL     LEFT HEART CATH AND  CORONARY ANGIOGRAPHY N/A 01/13/2018   Procedure: LEFT HEART CATH AND CORONARY ANGIOGRAPHY;  Surgeon: Nigel Mormon, MD;  Location: [facility];  Service: Cardiovascular;  Laterality: N/A;   LOWER EXTREMITY ANGIOGRAPHY N/A 01/13/2018   Procedure: LOWER EXTREMITY ANGIOGRAPHY;  Surgeon: Nigel Mormon, MD;  Location: [facility];  Service: Cardiovascular;  Laterality: N/A;   LOWER EXTREMITY ANGIOGRAPHY Right 01/27/2018   Procedure: LOWER EXTREMITY ANGIOGRAPHY;  Surgeon: Adrian Prows, MD;  Location: [facility];  Service: Cardiovascular;  Laterality: Right;   PERIPHERAL VASCULAR ATHERECTOMY Left 01/13/2018   Procedure: PERIPHERAL VASCULAR ATHERECTOMY;  Surgeon: Nigel Mormon, MD;  Location: [facility];  Service: Cardiovascular;  Laterality: Left;  SFA WITH PTA DRUG COATED BALLOON   PERIPHERAL VASCULAR INTERVENTION  01/27/2018   Procedure: PERIPHERAL VASCULAR INTERVENTION;  Surgeon: Adrian Prows, MD;  Location: [facility];  Service: Cardiovascular;;    Social History:  reports that he quit smoking about 44 years ago. His smoking use included cigarettes. He has a 0.13 pack-year smoking history. He has never used smokeless tobacco. He reports current alcohol use of about 6.0 - 7.0 standard drinks of alcohol per week. He reports current drug use. Drug: Marijuana. Family History: family history includes Alcohol abuse in his father; Breast  cancer in his daughter; CVA in his daughter; Cancer in his father and mother; Stroke in his daughter.   HOME MEDICATIONS: Allergies as of 01/03/2023       Reactions   Shellfish Allergy Other (See Comments)   Gout         Medication List        Accurate as of January 03, 2023 12:49 PM. If you have any questions, ask your nurse or doctor.          amLODipine 5 MG tablet Commonly known as: NORVASC TAKE ONE TABLET BY MOUTH DAILY   Apixaban Starter Pack (22m and 568m Commonly known as: ELIQUIS STARTER PACK Take  as directed on package: start with two-71m31mablets twice daily for 7 days. On day 8, switch to one-71mg42mblet twice daily.   ascorbic acid 500 MG tablet Commonly known as: VITAMIN C Take 500 mg daily by mouth.   atorvastatin 40 MG tablet Commonly known as: LIPITOR TAKE TWO TABLETS BY MOUTH DAILY   azelastine 0.1 % nasal spray Commonly known as: ASTELIN Place 2 sprays into both nostrils 2 (two) times daily. Use in each nostril as directed   bisoprolol 5 MG tablet Commonly known as: ZEBETA TAKE HALF TABLET BY MOUTH DAILY   chlorthalidone 25 MG tablet Commonly known as: HYGROTON Take 1 tablet (25 mg total) by mouth daily.   clopidogrel 75 MG tablet Commonly known as: PLAVIX TAKE ONE TABLET BY MOUTH DAILY   COMPLETE SENIOR PO 1 tablet daily.   cyanocobalamin 500 MCG tablet Commonly known as: VITAMIN B12 Take 500 mcg by mouth daily.   diclofenac Sodium 1 % Gel Commonly known as: VOLTAREN APPLY TWO GRAMS TOPICALLY FOUR TIMES A DAY   donepezil 10 MG tablet Commonly known as: ARICEPT TAKE 1 TABLET AT BEDTIME   furosemide 20 MG tablet Commonly known as: LASIX TAKE ONE TABLET BY MOUTH DAILY   gabapentin 300 MG capsule Commonly known as: NEURONTIN TAKE ONE CAPSULE BY MOUTH AT BEDTIME   hydrALAZINE 50 MG tablet Commonly known as: APRESOLINE Take 1 tablet (50 mg total) by mouth 3 (three) times daily.   icosapent Ethyl 1 g capsule Commonly known as: VASCEPA TAKE ONE CAPSULE BY MOUTH TWICE A DAY   isosorbide mononitrate 30 MG 24 hr tablet Commonly known as: IMDUR TAKE ONE TABLET BY MOUTH TWICE A DAY   ketorolac 0.5 % ophthalmic solution Commonly known as: ACULAR ketorolac 0.5 % eye drops   levocetirizine 5 MG tablet Commonly known as: XYZAL TAKE ONE TABLET BY MOUTH EVERY EVENING   Medical Compression Stockings Misc   methimazole 5 MG tablet Commonly known as: TAPAZOLE Take 1 tablet (5 mg total) by mouth daily.   Muscle Rub 10-15 % Crea muscle rub    Myrbetriq 25 MG Tb24 tablet Generic drug: mirabegron ER TAKE ONE TABLET BY MOUTH DAILY   tamsulosin 0.4 MG Caps capsule Commonly known as: FLOMAX TAKE ONE CAPSULE BY MOUTH DAILY HALF HOUR FOLLOWING THE SAME MEAL DAILY   Vitamin D3 125 MCG (5000 UT) Caps Take 5,000 Units by mouth daily.          REVIEW OF SYSTEMS: A comprehensive ROS was conducted with the patient and is negative except as per HPI and below:  ROS     OBJECTIVE:  VS: There were no vitals taken for this visit.   Wt Readings from Last 3 Encounters:  09/05/22 235 lb (106.6 kg)  08/20/22 237 lb (107.5 kg)  07/27/22 243 lb (110.2  kg)     EXAM: General: Pt appears well and is in NAD  Eyes: External eye exam normal without stare, lid lag or exophthalmos.  EOM intact.   Neck: General: Supple without adenopathy. Thyroid: Thyroid size normal.  No goiter or nodules appreciated.   Lungs: Clear with good BS bilat with no rales, rhonchi, or wheezes  Heart: Auscultation: RRR.  Abdomen: Normoactive bowel sounds, soft, nontender, without masses or organomegaly palpable  Extremities:  BL LE: No pretibial edema normal ROM and strength.  Mental Status: Judgment, insight: Intact Orientation: Oriented to time, place, and person Mood and affect: No depression, anxiety, or agitation     DATA REVIEWED:   Latest Reference Range & Units 08/20/22 13:33  TSH 0.35 - 5.50 uIU/mL 0.07 (L)  T4,Free(Direct) 0.60 - 1.60 ng/dL 0.96  (L): Data is abnormally low   Latest Reference Range & Units 07/28/22 00:13  Sodium 135 - 145 mmol/L 143  Potassium 3.5 - 5.1 mmol/L 4.1  Chloride 98 - 111 mmol/L 111  CO2 22 - 32 mmol/L 25  Glucose 70 - 99 mg/dL 119 (H)  BUN 8 - 23 mg/dL 16  Creatinine 0.61 - 1.24 mg/dL 1.34 (H)  Calcium 8.9 - 10.3 mg/dL 9.3  Anion gap 5 - 15  7  Alkaline Phosphatase 38 - 126 U/L 60  Albumin 3.5 - 5.0 g/dL 3.3 (L)  AST 15 - 41 U/L 26  ALT 0 - 44 U/L 18  Total Protein 6.5 - 8.1 g/dL 7.4  Total Bilirubin  0.3 - 1.2 mg/dL 0.6  GFR, Estimated >60 mL/min 51 (L)  Troponin I (High Sensitivity) <18 ng/L 25 (H)  WBC 4.0 - 10.5 K/uL 6.3  RBC 4.22 - 5.81 MIL/uL 3.78 (L)  Hemoglobin 13.0 - 17.0 g/dL 11.4 (L)  HCT 39.0 - 52.0 % 36.6 (L)  MCV 80.0 - 100.0 fL 96.8  MCH 26.0 - 34.0 pg 30.2  MCHC 30.0 - 36.0 g/dL 31.1  RDW 11.5 - 15.5 % 13.6  Platelets 150 - 400 K/uL 171  nRBC 0.0 - 0.2 % 0.0  Neutrophils % 71  Lymphocytes % 19  Monocytes Relative % 7  Eosinophil % 1  Basophil % 1  Immature Granulocytes % 1  NEUT# 1.7 - 7.7 K/uL 4.5  Lymphocyte # 0.7 - 4.0 K/uL 1.2  Monocyte # 0.1 - 1.0 K/uL 0.4  Eosinophils Absolute 0.0 - 0.5 K/uL 0.1  Basophils Absolute 0.0 - 0.1 K/uL 0.0  Abs Immature Granulocytes 0.00 - 0.07 K/uL 0.03      Thyroid ultrasound 02/05/2019  Estimated total number of nodules >/= 1 cm: 2   Number of spongiform nodules >/=  2 cm not described below (TR1): 0   Number of mixed cystic and solid nodules >/= 1.5 cm not described below (TR2): 0   _________________________________________________________   Nodule # 1:   Location: Right; Mid   Maximum size: 2.5 cm; Other 2 dimensions: 2.0 x 1.4 cm   Composition: solid/almost completely solid (2)   Echogenicity: isoechoic (1)   Shape: not taller-than-wide (0)   Margins: smooth (0)   Echogenic foci: none (0)   ACR TI-RADS total points: 3.   ACR TI-RADS risk category: TR3 (3 points).   ACR TI-RADS recommendations:   **Given size (>/= 2.5 cm) and appearance, fine needle aspiration of this mildly suspicious nodule should be considered based on TI-RADS criteria.   _________________________________________________________   Nodule # 2:   Location: Left; Mid   Maximum size: 7.2 cm; Other  2 dimensions: 4.5 x 4.8 cm   Composition: solid/almost completely solid (2)   Echogenicity: isoechoic (1)   Shape: not taller-than-wide (0)   Margins: smooth (0)   Echogenic foci: none (0)   ACR TI-RADS total points:  3.   ACR TI-RADS risk category: TR3 (3 points).   ACR TI-RADS recommendations:   **Given size (>/= 2.5 cm) and appearance, fine needle aspiration of this mildly suspicious nodule should be considered based on TI-RADS criteria.   _________________________________________________________   IMPRESSION: 1. Nodule # 1, a 2.5 cm TI-RADS category 3 nodule in the medial right mid gland meets criteria for consideration of fine-needle aspiration biopsy. 2. Nodule # 2, a large 7.2 cm TI-RADS category 3 nodule occupying the majority of the left thyroid gland meets criteria for consideration of fine-needle aspiration biopsy.   FNA left mid 7.2 cm nodule 04/15/2019  THYROID FINE NEEDLE ASPIRATION LEFT LOBE (SPECIMEN 2 OF 2, COLLECTED 04/15/2019): CONSISTENT WITH BENIGN FOLLICULAR NODULE (BETHESDA CATEGORY II).   FNA right mid 2.5 cm nodule 04/15/2019   THYROID FINE NEEDLE ASPIRATION MID RIGHT LOBE (SPECIMEN 1 OF 2, COLLECTED 04/15/2019): CONSISTENT WITH BENIGN FOLLICULAR NODULE (BETHESDA CATEGORY II).  ASSESSMENT/PLAN/RECOMMENDATIONS:   Multinodular goiter:  -No local neck symptoms -Patient is s/p benign FNA of bilateral nodules in 2020 -We will proceed with repeat thyroid ultrasound -This is most likely the reason for his hyperthyroidism    2.  Subclinical hyperthyroid:  -Patient is clinically euthyroid -This was a limited visit as the patient is a poor historian, with hearing difficulty ,a and mild of cannabis which would affect his short-term memory.  He did not bring his medication list, thus was unable to confirm the intake of medications -Echo was provided to his pharmacy and they confirmed the last prescription of methimazole was dispensed in April for #30 -I will prescribe methimazole as below  Medication Start  methimazole 5 mg, 1 tablet daily  Follow-up in 4 months  Signed electronically by: Mack Guise, MD  [facility]  [place]  [address] [place], [place] [address] Phone: [phone] FAX: [phone]   CC: System, Provider Not In No address on file Phone: None Fax: None   Return to Endocrinology clinic as below: Future Appointments  Date Time Provider [place]  01/03/2023  1:00 PM Talicia Sui, Melanie Crazier, MD [id number] None

## 2023-01-17 ENCOUNTER — Other Ambulatory Visit: Payer: Self-pay | Admitting: Nurse Practitioner

## 2023-01-17 ENCOUNTER — Other Ambulatory Visit: Payer: Self-pay | Admitting: Cardiology

## 2023-01-17 DIAGNOSIS — I10 Essential (primary) hypertension: Secondary | ICD-10-CM

## 2023-01-17 DIAGNOSIS — E0590 Thyrotoxicosis, unspecified without thyrotoxic crisis or storm: Secondary | ICD-10-CM | POA: Insufficient documentation

## 2023-01-20 ENCOUNTER — Ambulatory Visit (INDEPENDENT_AMBULATORY_CARE_PROVIDER_SITE_OTHER): Payer: Medicare HMO | Admitting: Nurse Practitioner

## 2023-01-20 ENCOUNTER — Encounter: Payer: Self-pay | Admitting: Nurse Practitioner

## 2023-01-20 VITALS — BP 122/58 | HR 54 | Temp 98.3°F | Ht 72.0 in | Wt 237.0 lb

## 2023-01-20 DIAGNOSIS — E7800 Pure hypercholesterolemia, unspecified: Secondary | ICD-10-CM

## 2023-01-20 DIAGNOSIS — R972 Elevated prostate specific antigen [PSA]: Secondary | ICD-10-CM

## 2023-01-20 DIAGNOSIS — Z79899 Other long term (current) drug therapy: Secondary | ICD-10-CM | POA: Diagnosis not present

## 2023-01-20 DIAGNOSIS — M19042 Primary osteoarthritis, left hand: Secondary | ICD-10-CM

## 2023-01-20 DIAGNOSIS — Z23 Encounter for immunization: Secondary | ICD-10-CM

## 2023-01-20 DIAGNOSIS — I10 Essential (primary) hypertension: Secondary | ICD-10-CM

## 2023-01-20 DIAGNOSIS — E0590 Thyrotoxicosis, unspecified without thyrotoxic crisis or storm: Secondary | ICD-10-CM | POA: Diagnosis not present

## 2023-01-20 DIAGNOSIS — R300 Dysuria: Secondary | ICD-10-CM

## 2023-01-20 DIAGNOSIS — N1831 Chronic kidney disease, stage 3a: Secondary | ICD-10-CM | POA: Diagnosis not present

## 2023-01-20 DIAGNOSIS — M19041 Primary osteoarthritis, right hand: Secondary | ICD-10-CM

## 2023-01-20 DIAGNOSIS — I129 Hypertensive chronic kidney disease with stage 1 through stage 4 chronic kidney disease, or unspecified chronic kidney disease: Secondary | ICD-10-CM | POA: Diagnosis not present

## 2023-01-20 DIAGNOSIS — Z748 Other problems related to care provider dependency: Secondary | ICD-10-CM

## 2023-01-20 DIAGNOSIS — N183 Chronic kidney disease, stage 3 unspecified: Secondary | ICD-10-CM | POA: Insufficient documentation

## 2023-01-20 LAB — POC URINALSYSI DIPSTICK (AUTOMATED)
Bilirubin, UA: NEGATIVE
Blood, UA: NEGATIVE
Glucose, UA: NEGATIVE
Ketones, UA: NEGATIVE
Nitrite, UA: NEGATIVE
Protein, UA: POSITIVE — AB
Spec Grav, UA: 1.02 (ref 1.010–1.025)
Urobilinogen, UA: NEGATIVE E.U./dL — AB
pH, UA: 6.5 (ref 5.0–8.0)

## 2023-01-20 MED ORDER — ACETAMINOPHEN ER 650 MG PO TBCR: 650.0000 mg | EXTENDED_RELEASE_TABLET | Freq: Two times a day (BID) | ORAL | 5 refills | Status: DC

## 2023-01-20 NOTE — Progress Notes (Addendum)
I,Jesus Lam,acting as a Neurosurgeon for Jesus Felts, FNP.,have documented all relevant documentation on the behalf of Jesus Felts, FNP,as directed by  Jesus Felts, FNP while in the presence of Jesus Felts, FNP.    Subjective:     Patient ID: Jesus Lam , male    DOB: 1933/01/17 , 89 y.o.   MRN: [id number]   Chief Complaint  Patient presents with   Hypertension    HPI  Patient presents today for follow up: Htn, HLD, [place] Hyperthyroidism, >PSA. Patient reports pain in both hands, decreased ROM and difficulty with tasks. Patient also reports dysuria x2w, denies hematuria, abd pain, n/v or other symptoms.   He had transferred to another provider last year, denies having an issue with the office but reports his daughter switched him  He rode the bus today and walked 3 blocks to get to the office.    Hypertension This is a chronic problem. The problem is controlled. Pertinent negatives include no anxiety. Risk factors for coronary artery disease include obesity, male gender and dyslipidemia. Compliance problems include exercise.  There is no history of angina. There is no history of chronic renal disease.     Past Medical History:  Diagnosis Date   Anemia    BPH (benign prostatic hyperplasia)    Cataracts, bilateral    CVA (cerebral vascular accident) (HCC)    DVT (deep venous thrombosis) (HCC)    dvt in left leg   Dyslipidemia    Gout    Gout    HTN (hypertension)    Left hip pain 03/15/2020   Left leg pain    Osteoarthritis    PAD (peripheral artery disease) (HCC)    Stasis dermatitis    Stroke (HCC)    Vitamin D deficiency      Family History  Problem Relation Age of Onset   Cancer Mother    Cancer Father    Alcohol abuse Father    Breast cancer Daughter    Stroke Daughter    CVA Daughter      Current Outpatient Medications:    acetaminophen (TYLENOL 8 HOUR ARTHRITIS PAIN) 650 MG CR tablet, Take 1 tablet (650 mg total) by mouth 2 (two) times daily.,  Disp: 60 tablet, Rfl: 5   atorvastatin (LIPITOR) 40 MG tablet, TAKE TWO TABLETS BY MOUTH DAILY, Disp: 90 tablet, Rfl: 5   azelastine (ASTELIN) 0.1 % nasal spray, Place 2 sprays into both nostrils 2 (two) times daily. Use in each nostril as directed, Disp: 30 mL, Rfl: 5   bisoprolol (ZEBETA) 5 MG tablet, TAKE HALF TABLET BY MOUTH DAILY, Disp: 60 tablet, Rfl: 2   chlorthalidone (HYGROTON) 25 MG tablet, Take 1 tablet (25 mg total) by mouth daily., Disp: 5 tablet, Rfl: 0   Cholecalciferol (VITAMIN D3) 5000 units CAPS, Take 5,000 Units by mouth daily., Disp: , Rfl:    diclofenac Sodium (VOLTAREN) 1 % GEL, APPLY TWO GRAMS TOPICALLY FOUR TIMES A DAY, Disp: 100 g, Rfl: 1   Elastic Bandages & Supports (MEDICAL COMPRESSION STOCKINGS) MISC, , Disp: , Rfl:    furosemide (LASIX) 20 MG tablet, TAKE ONE TABLET BY MOUTH DAILY, Disp: 90 tablet, Rfl: 1   gabapentin (NEURONTIN) 300 MG capsule, TAKE ONE CAPSULE BY MOUTH AT BEDTIME, Disp: 90 capsule, Rfl: 0   icosapent Ethyl (VASCEPA) 1 g capsule, TAKE ONE CAPSULE BY MOUTH TWICE A DAY, Disp: 60 capsule, Rfl: 0   isosorbide mononitrate (IMDUR) 30 MG 24 hr tablet, TAKE ONE TABLET BY MOUTH  TWICE A DAY, Disp: 60 tablet, Rfl: 0   ketorolac (ACULAR) 0.5 % ophthalmic solution, ketorolac 0.5 % eye drops, Disp: , Rfl:    levocetirizine (XYZAL) 5 MG tablet, TAKE ONE TABLET BY MOUTH EVERY EVENING, Disp: 90 tablet, Rfl: 4   Menthol-Methyl Salicylate (MUSCLE RUB) 10-15 % CREA, muscle rub, Disp: , Rfl:    Multiple Vitamins-Minerals (COMPLETE SENIOR PO), 1 tablet daily. , Disp: , Rfl:    MYRBETRIQ 25 MG TB24 tablet, TAKE ONE TABLET BY MOUTH DAILY, Disp: 30 tablet, Rfl: 0   tamsulosin (FLOMAX) 0.4 MG CAPS capsule, TAKE ONE CAPSULE BY MOUTH DAILY HALF HOUR FOLLOWING THE SAME MEAL DAILY, Disp: 90 capsule, Rfl: 4   vitamin B-12 (CYANOCOBALAMIN) 500 MCG tablet, Take 500 mcg by mouth daily., Disp: , Rfl:    vitamin C (ASCORBIC ACID) 500 MG tablet, Take 500 mg daily by mouth., Disp: , Rfl:     amLODipine (NORVASC) 5 MG tablet, Take 1 tablet (5 mg total) by mouth daily., Disp: 180 tablet, Rfl: 3   clopidogrel (PLAVIX) 75 MG tablet, TAKE ONE TABLET BY MOUTH DAILY, Disp: 90 tablet, Rfl: 0   donepezil (ARICEPT) 10 MG tablet, TAKE 1 TABLET AT BEDTIME, Disp: 90 tablet, Rfl: 3   doxycycline (MONODOX) 100 MG capsule, Take 1 capsule (100 mg total) by mouth 2 (two) times daily., Disp: 14 capsule, Rfl: 0   hydrALAZINE (APRESOLINE) 50 MG tablet, Take 1 tablet (50 mg total) by mouth 3 (three) times daily., Disp: 270 tablet, Rfl: 3   methimazole (TAPAZOLE) 5 MG tablet, Take 1 tablet (5 mg total) by mouth daily., Disp: 30 tablet, Rfl: 0  Current Facility-Administered Medications:    0.9 %  sodium chloride infusion, , Intravenous, PRN, Ghumman, Ramandeep, NP   Allergies  Allergen Reactions   Shellfish Allergy Other (See Comments)    Gout      Review of Systems  Constitutional: Negative.   Respiratory: Negative.    Cardiovascular: Negative.   Genitourinary:  Positive for dysuria.  Musculoskeletal:  Positive for arthralgias.  Psychiatric/Behavioral: Negative.    All other systems reviewed and are negative.    Today's Vitals   01/20/23 1202  BP: (!) 122/58  Pulse: (!) 54  Temp: 98.3 F (36.8 C)  TempSrc: Oral  SpO2: 97%  Weight: 237 lb (107.5 kg)  Height: 6' (1.829 m)   Body mass index is 32.14 kg/m.   Objective:  Physical Exam Vitals reviewed.  Constitutional:      General: He is not in acute distress.    Appearance: Normal appearance. He is obese.  Cardiovascular:     Rate and Rhythm: Normal rate and regular rhythm.     Pulses: Normal pulses.     Heart sounds: Normal heart sounds. No murmur heard. Pulmonary:     Effort: Pulmonary effort is normal. No respiratory distress.     Breath sounds: Normal breath sounds. No wheezing.  Musculoskeletal:        General: No swelling.  Skin:    General: Skin is warm and dry.     Capillary Refill: Capillary refill takes less  than 2 seconds.  Neurological:     General: No focal deficit present.     Mental Status: He is alert and oriented to person, place, and time.  Psychiatric:        Mood and Affect: Mood normal.        Behavior: Behavior normal.        Thought Content: Thought content normal.  Judgment: Judgment normal.         Assessment And Plan:     1. Benign hypertension without CHF Comments: Blood pressure is well controlled, continue current medications. He is to follow up with Cardiology as well. - AMB Referral to [facility] Coordinaton (ACO Patients)  2. Subclinical hyperthyroidism Comments: Continue follow up with Endocrinology - AMB Referral to [facility] Coordinaton (ACO Patients) - TSH + free T4 - CMP14+EGFR  3. Stage 3a chronic kidney disease (HCC) Comments: Will recheck eGFR, encouraged to avoid taking NSAIDs and to stay well hydrated with water.  4. Pure hypercholesterolemia Comments: Cholesterol levels are stable. Continue current medications. - AMB Referral to [facility] Coordinaton (ACO Patients) - CMP14+EGFR - Lipid panel  5. Dysuria Comments: Urinalysis is normal. Continue to stay wll hydrated, will recheck PSA - POCT Urinalysis Dipstick (Automated) - Urine Culture  6. Elevated PSA - PSA  7. Primary osteoarthritis of both hands Comments: Encouraged to take tylenol twice a day and limit intake of gluten - acetaminophen (TYLENOL 8 HOUR ARTHRITIS PAIN) 650 MG CR tablet; Take 1 tablet (650 mg total) by mouth 2 (two) times daily.  Dispense: 60 tablet; Refill: 5  8. Other long term (current) drug therapy - CBC  9. Assistance needed with transportation Comments: Brought a SCAT form, will complete - AMB Referral to [facility] Coordinaton (ACO Patients)  10. Need for shingles vaccine - Zoster Recombinant (Shingrix )     Patient was given opportunity to ask questions. Patient verbalized understanding of the plan and was able to repeat key  elements of the plan. All questions were answered to their satisfaction.  Jesus Felts, FNP   I, Jesus Felts, FNP, have reviewed all documentation for this visit. The documentation on 01/20/23 for the exam, diagnosis, procedures, and orders are all accurate and complete.   IF YOU HAVE BEEN REFERRED TO A SPECIALIST, IT MAY TAKE 1-2 WEEKS TO SCHEDULE/PROCESS THE REFERRAL. IF YOU HAVE NOT HEARD FROM US/SPECIALIST IN TWO WEEKS, PLEASE GIVE [place] A CALL AT [phone].   THE PATIENT IS ENCOURAGED TO PRACTICE SOCIAL DISTANCING DUE TO THE COVID-19 PANDEMIC.

## 2023-01-21 ENCOUNTER — Telehealth: Payer: Self-pay | Admitting: *Deleted

## 2023-01-21 LAB — CMP14+EGFR
ALT: 13 IU/L (ref 0–44)
AST: 19 IU/L (ref 0–40)
Albumin/Globulin Ratio: 1.3 (ref 1.2–2.2)
Albumin: 4 g/dL (ref 3.7–4.7)
Alkaline Phosphatase: 89 IU/L (ref 44–121)
BUN/Creatinine Ratio: 14 (ref 10–24)
BUN: 18 mg/dL (ref 8–27)
Bilirubin Total: 0.5 mg/dL (ref 0.0–1.2)
CO2: 24 mmol/L (ref 20–29)
Calcium: 9.3 mg/dL (ref 8.6–10.2)
Chloride: 108 mmol/L — ABNORMAL HIGH (ref 96–106)
Creatinine, Ser: 1.3 mg/dL — ABNORMAL HIGH (ref 0.76–1.27)
Globulin, Total: 3.2 g/dL (ref 1.5–4.5)
Glucose: 94 mg/dL (ref 70–99)
Potassium: 4.9 mmol/L (ref 3.5–5.2)
Sodium: 144 mmol/L (ref 134–144)
Total Protein: 7.2 g/dL (ref 6.0–8.5)
eGFR: 53 mL/min/{1.73_m2} — ABNORMAL LOW (ref >59)

## 2023-01-21 LAB — CBC
Hematocrit: 37.6 % (ref 37.5–51.0)
Hemoglobin: 11.6 g/dL — ABNORMAL LOW (ref 13.0–17.7)
MCH: 29.2 pg (ref 26.6–33.0)
MCHC: 30.9 g/dL — ABNORMAL LOW (ref 31.5–35.7)
MCV: 95 fL (ref 79–97)
Platelets: 169 10*3/uL (ref 150–450)
RBC: 3.97 x10E6/uL — ABNORMAL LOW (ref 4.14–5.80)
RDW: 13.3 % (ref 11.6–15.4)
WBC: 6.2 10*3/uL (ref 3.4–10.8)

## 2023-01-21 LAB — LIPID PANEL
Chol/HDL Ratio: 4.2 ratio (ref 0.0–5.0)
Cholesterol, Total: 123 mg/dL (ref 100–199)
HDL: 29 mg/dL — ABNORMAL LOW (ref >39)
LDL Chol Calc (NIH): 64 mg/dL (ref 0–99)
Triglycerides: 180 mg/dL — ABNORMAL HIGH (ref 0–149)
VLDL Cholesterol Cal: 30 mg/dL (ref 5–40)

## 2023-01-21 LAB — TSH+FREE T4
Free T4: 1.29 ng/dL (ref 0.82–1.77)
TSH: 1.12 u[IU]/mL (ref 0.450–4.500)

## 2023-01-21 LAB — PSA: Prostate Specific Ag, Serum: 9.2 ng/mL — ABNORMAL HIGH (ref 0.0–4.0)

## 2023-01-21 NOTE — Progress Notes (Signed)
  Care Coordination   Note   01/21/2023 Name: Jesus Lam MRN: [id number] DOB: 1933-08-06  Jesus Lam is a 89 y.o. year old male who sees Minette Brine, [place] for primary care. I reached out to [employer] by phone today to offer care coordination services.  [name] was given information about Care Coordination services today including:   The Care Coordination services include support from the care team which includes your Nurse Coordinator, Clinical Social Worker, or Pharmacist.  The Care Coordination team is here to help remove barriers to the health concerns and goals most important to you. Care Coordination services are voluntary, and the patient may decline or stop services at any time by request to their care team member.   Care Coordination Consent Status: Patient agreed to services and verbal consent obtained.   Follow up plan:  Telephone appointment with care coordination team member scheduled for:  01/29/23  Encounter Outcome:  Pt. Scheduled  [place]  Direct Dial: [phone]'

## 2023-01-22 ENCOUNTER — Other Ambulatory Visit: Payer: Self-pay | Admitting: Nurse Practitioner

## 2023-01-22 DIAGNOSIS — G3184 Mild cognitive impairment, so stated: Secondary | ICD-10-CM

## 2023-01-22 LAB — URINE CULTURE

## 2023-01-23 ENCOUNTER — Other Ambulatory Visit: Payer: Self-pay

## 2023-01-23 DIAGNOSIS — M1712 Unilateral primary osteoarthritis, left knee: Secondary | ICD-10-CM

## 2023-01-28 ENCOUNTER — Other Ambulatory Visit: Payer: Self-pay | Admitting: Internal Medicine

## 2023-01-29 ENCOUNTER — Ambulatory Visit: Payer: Self-pay

## 2023-01-29 NOTE — Patient Outreach (Signed)
  Care Coordination   01/29/2023 Name: Sunday Cappel MRN: [id number] DOB: 08/15/1933   Care Coordination Outreach Attempts:  An unsuccessful telephone outreach was attempted for a scheduled appointment today.  Follow Up Plan:  Additional outreach attempts will be made to offer the patient care coordination information and services.   Encounter Outcome:  No Answer   Care Coordination Interventions:  No, not indicated    Barb Merino, RN, BSN, CCM Care Management Coordinator [facility] Care Management Direct Phone: [phone]

## 2023-01-30 ENCOUNTER — Ambulatory Visit (INDEPENDENT_AMBULATORY_CARE_PROVIDER_SITE_OTHER): Payer: Medicare HMO | Admitting: Orthopaedic Surgery

## 2023-01-30 ENCOUNTER — Encounter: Payer: Self-pay | Admitting: Orthopaedic Surgery

## 2023-01-30 DIAGNOSIS — I10 Essential (primary) hypertension: Secondary | ICD-10-CM | POA: Diagnosis not present

## 2023-01-30 DIAGNOSIS — M1712 Unilateral primary osteoarthritis, left knee: Secondary | ICD-10-CM | POA: Diagnosis not present

## 2023-01-30 DIAGNOSIS — R6889 Other general symptoms and signs: Secondary | ICD-10-CM | POA: Diagnosis not present

## 2023-01-30 MED ORDER — HYALURONAN 88 MG/4ML IX SOSY
88.0000 mg | PREFILLED_SYRINGE | INTRA_ARTICULAR | Status: AC | PRN
  Administered 2023-01-30: 88 mg via INTRA_ARTICULAR

## 2023-01-30 NOTE — Progress Notes (Signed)
Office Visit Note   Patient: Jesus Lam           Date of Birth: 16-Jun-1933           MRN: [id number] Visit Date: 01/30/2023              Requested by: Minette Brine, [place] [address] [place],  [place] [address] PCP: Minette Brine, FNP   Assessment & Plan: Visit Diagnoses:  1. Unilateral primary osteoarthritis, left knee     Plan: Impression is left knee osteoarthritis.  Today, we injected the left knee with Monovisc.  He tolerated this well.  He will follow-up as needed. Lot [id number] Expiration date 07/30/2025  Follow-Up Instructions: Return if symptoms worsen or fail to improve.   Orders:  No orders of the defined types were placed in this encounter.  No orders of the defined types were placed in this encounter.     Procedures: Large Joint Inj: L knee on 01/30/2023 4:56 PM Indications: pain Details: 22 G needle  Arthrogram: No  Medications: 88 mg Hyaluronan 88 MG/4ML Outcome: tolerated well, no immediate complications Patient was prepped and draped in the usual sterile fashion.       Clinical Data: No additional findings.   Subjective: Chief Complaint  Patient presents with   Left Knee - Pain    HPI patient is a pleasant 89 year old gentleman with left knee osteoarthritis who comes in today for his first Monovisc injection to the left knee.  No previous viscosupplementation injection.     Objective: Vital Signs: There were no vitals taken for this visit.    Ortho Exam left knee exam is unchanged  Specialty Comments:  No specialty comments available.  Imaging: No new imaging   PMFS History: Patient Active Problem List   Diagnosis Date Noted   Stage 3 chronic kidney disease, unspecified whether stage 3a or 3b CKD ([place]) 01/20/2023   Hyperthyroidism 01/17/2023   Bilateral impacted cerumen 11/01/2021   Presbycusis of both ears 11/01/2021   Leg edema 08/01/2021   Mild cognitive impairment 09/07/2020   Chronic pain of left  knee 03/15/2020   Pure hypercholesterolemia 11/15/2019   First degree AV block 07/28/2019   Hypertriglyceridemia 04/29/2019   Mixed hyperlipidemia 04/29/2019   CVA (cerebral vascular accident) ([place])    Bradycardia    Subclinical hyperthyroidism 11/18/2018   Peripheral artery disease ([place]) 06/13/2018   Ischemic cerebrovascular accident (CVA) ([place]) 06/13/2018   Dysarthria 06/12/2018   Renal insufficiency 06/12/2018   Claudication ([place]) 01/11/2018   Anemia 10/10/2017   Essential hypertension 11/04/2013   Past Medical History:  Diagnosis Date   Anemia    BPH (benign prostatic hyperplasia)    Cataracts, bilateral    CVA (cerebral vascular accident) ([place])    DVT (deep venous thrombosis) (HCC)    dvt in left leg   Dyslipidemia    Gout    Gout    HTN (hypertension)    Left hip pain 03/15/2020   Left leg pain    Osteoarthritis    PAD (peripheral artery disease) ([place])    Stasis dermatitis    Stroke ([place])    Vitamin D deficiency     Family History  Problem Relation Age of Onset   Cancer Mother    Cancer Father    Alcohol abuse Father    Breast cancer Daughter    Stroke Daughter    CVA Daughter     Past Surgical History:  Procedure Laterality Date   ABDOMINAL AORTOGRAM  N/A 01/13/2018   Procedure: ABDOMINAL AORTOGRAM;  Surgeon: Nigel Mormon, MD;  Location: [place] [facility];  Service: Cardiovascular;  Laterality: N/A;   CATARACT EXTRACTION, BILATERAL     LEFT HEART CATH AND CORONARY ANGIOGRAPHY N/A 01/13/2018   Procedure: LEFT HEART CATH AND CORONARY ANGIOGRAPHY;  Surgeon: Nigel Mormon, MD;  Location: [place] [facility];  Service: Cardiovascular;  Laterality: N/A;   LOWER EXTREMITY ANGIOGRAPHY N/A 01/13/2018   Procedure: LOWER EXTREMITY ANGIOGRAPHY;  Surgeon: Nigel Mormon, MD;  Location: [place] [facility];  Service: Cardiovascular;  Laterality: N/A;   LOWER EXTREMITY ANGIOGRAPHY Right 01/27/2018   Procedure: LOWER EXTREMITY ANGIOGRAPHY;  Surgeon: Adrian Prows, MD;  Location: [place] [facility];  Service: Cardiovascular;  Laterality: Right;   PERIPHERAL VASCULAR ATHERECTOMY Left 01/13/2018   Procedure: PERIPHERAL VASCULAR ATHERECTOMY;  Surgeon: Nigel Mormon, MD;  Location: [place] [facility];  Service: Cardiovascular;  Laterality: Left;  SFA WITH PTA DRUG COATED BALLOON   PERIPHERAL VASCULAR INTERVENTION  01/27/2018   Procedure: PERIPHERAL VASCULAR INTERVENTION;  Surgeon: Adrian Prows, MD;  Location: [place] [facility];  Service: Cardiovascular;;   Social History   Occupational History   Occupation: retired  Tobacco Use   Smoking status: Former    Packs/day: 0.25    Years: 0.50    Total pack years: 0.13    Types: Cigarettes    Quit date: 1980    Years since quitting: 44.2   Smokeless tobacco: Never  Vaping Use   Vaping Use: Never used  Substance and Sexual Activity   Alcohol use: Yes    Alcohol/week: 6.0 - 7.0 standard drinks of alcohol    Types: 6 - 7 Shots of liquor per week    Comment: 6-7 drinks per week   Drug use: Yes    Types: Marijuana    Comment: last used x 2 hours ago   Sexual activity: Yes

## 2023-01-31 DIAGNOSIS — R6889 Other general symptoms and signs: Secondary | ICD-10-CM | POA: Diagnosis not present

## 2023-02-04 ENCOUNTER — Ambulatory Visit: Payer: Medicare HMO

## 2023-02-04 NOTE — Patient Outreach (Signed)
  Care Coordination   Initial Visit Note   02/04/2023 Name: Amogh Komatsu MRN: [id number] DOB: 08-14-1933  [place] Hijazi is a 89 y.o. year old male who sees Minette Brine, [place] for primary care. I spoke with  [name] by phone today.  What matters to the patients health and wellness today?  Patient will call [facility] to schedule a follow up to evaluate elevated PSA. Patient will start an antibiotic for symptom management.     Goals Addressed             This Visit's Progress    To schedule a folllow up with [facility] for evaluation of elevated PSA       Care Coordination Interventions: Evaluation of current treatment plan related to elevated PSA and patient's adherence to plan as established by provider Discussed with patient he is experiencing the following symptoms, painful urination with burning. no blood no discharge no fever no other symptoms Determined patient has yet to contact [facility] to schedule an appointment Instructed patient to call today to schedule an appointment and provided patient with the contact number Collaborated with PCP to report symptoms, discussed PCP will send in an Rx for Doxycycline for patient to take as prescribed while waiting to be seen by Urology Informed patient of the Rx sent by PCP and instructed patient to take this antibiotic exactly as prescribed and complete full course of treatment, patient verbalizes understanding and denies having questions        Interventions Today    Flowsheet Row Most Recent Value  Chronic Disease   Chronic disease during today's visit Other  [elevated PSA]  General Interventions   General Interventions Discussed/Reviewed General Interventions Discussed, General Interventions Reviewed, Doctor Visits  Doctor Visits Discussed/Reviewed PCP, Specialist, Doctor Visits Discussed, Doctor Visits Reviewed  Education Interventions   Education Provided Provided Education  Pharmacy  Interventions   Pharmacy Dicussed/Reviewed Pharmacy Topics Discussed, Pharmacy Topics Reviewed, Medications and their functions, Medication Adherence          SDOH assessments and interventions completed:  No     Care Coordination Interventions:  Yes, provided   Follow up plan: Follow up call scheduled for 02/17/23 @12 :30 PM     Encounter Outcome:  Pt. Visit Completed

## 2023-02-04 NOTE — Patient Instructions (Signed)
Visit Information  Thank you for taking time to visit with me today. Please don't hesitate to contact me if I can be of assistance to you.   Following are the goals we discussed today:   Goals Addressed             This Visit's Progress    To schedule a folllow up with [facility] for evaluation of elevated PSA       Care Coordination Interventions: Evaluation of current treatment plan related to elevated PSA and patient's adherence to plan as established by provider Discussed with patient he is experiencing the following symptoms, painful urination with burning. no blood no discharge no fever no other symptoms Determined patient has yet to contact [facility] to schedule an appointment Instructed patient to call today to schedule an appointment and provided patient with the contact number Collaborated with PCP to report symptoms, discussed PCP will send in an Rx for Doxycycline for patient to take as prescribed while waiting to be seen by Urology Informed patient of the Rx sent by PCP and instructed patient to take this antibiotic exactly as prescribed and complete full course of treatment, patient verbalizes understanding and denies having questions           Our next appointment is by telephone on 02/17/23 at 12:30 PM  Please call the care guide team at [phone] if you need to cancel or reschedule your appointment.   If you are experiencing a Mental Health or [place] or need someone to talk to, please call 1-800-273-TALK (toll free, 24 hour hotline) go to [facility] [place] [phone])  The patient verbalized understanding of instructions, educational materials, and care plan provided today and DECLINED offer to receive copy of patient instructions, educational materials, and care plan.   Barb Merino, RN, BSN, CCM Care Management Coordinator [facility] Care Management Direct Phone:  [phone]

## 2023-02-05 DIAGNOSIS — R6889 Other general symptoms and signs: Secondary | ICD-10-CM | POA: Diagnosis not present

## 2023-02-05 NOTE — Progress Notes (Signed)
This encounter was created in error - please disregard.

## 2023-02-07 DIAGNOSIS — R6889 Other general symptoms and signs: Secondary | ICD-10-CM | POA: Diagnosis not present

## 2023-02-12 DIAGNOSIS — R6889 Other general symptoms and signs: Secondary | ICD-10-CM | POA: Diagnosis not present

## 2023-02-14 DIAGNOSIS — R6889 Other general symptoms and signs: Secondary | ICD-10-CM | POA: Diagnosis not present

## 2023-02-17 ENCOUNTER — Other Ambulatory Visit: Payer: Self-pay | Admitting: Nurse Practitioner

## 2023-02-17 ENCOUNTER — Ambulatory Visit: Payer: Self-pay

## 2023-02-17 DIAGNOSIS — R32 Unspecified urinary incontinence: Secondary | ICD-10-CM

## 2023-02-17 DIAGNOSIS — R350 Frequency of micturition: Secondary | ICD-10-CM

## 2023-02-17 DIAGNOSIS — R972 Elevated prostate specific antigen [PSA]: Secondary | ICD-10-CM

## 2023-02-17 MED ORDER — DOXYCYCLINE MONOHYDRATE 100 MG PO CAPS: 100.0000 mg | ORAL_CAPSULE | Freq: Two times a day (BID) | ORAL | 0 refills | Status: DC

## 2023-02-17 NOTE — Patient Instructions (Signed)
Visit Information  Thank you for taking time to visit with me today. Please don't hesitate to contact me if I can be of assistance to you.   Following are the goals we discussed today:   Goals Addressed             This Visit's Progress    To schedule a folllow up with [facility] for evaluation of elevated PSA       Care Coordination Interventions: Evaluation of current treatment plan related to elevated PSA with symptoms of impaired urinary elimination and patient's adherence to plan as established by provider Discussed with patient he is continues to experience painful urination with burning, no blood, no discharge, no fever, no other symptoms Determined patient did not receive an Rx for Doxycycline as recommended by PCP Determined patient did however, schedule a new patient appointment to see Dr. Zigmund Daniel with [facility] for further evaluation of symptoms and elevated PSA Collaborated with PCP Minette Brine FNP regarding ongoing symptoms, advised patient did not receive Rx for Doxycycline  Determined PCP will send in new Rx for this antibiotic for patient to start ASAP Educated patient regarding the indication, dosage and frequency of this medication, instructed patient to complete the full course of medicine even if his symptoms improve and he verbalizes understanding of these instructions  Counseled patient on the importance of keeping his new patient appointment with Dr. Zigmund Daniel as directed          Our next appointment is by telephone on 02/28/23 at 1:00 PM   Please call the care guide team at [phone] if you need to cancel or reschedule your appointment.   If you are experiencing a Mental Health or [place] or need someone to talk to, please call 1-800-273-TALK (toll free, 24 hour hotline) go to [facility] [place] [phone])  The patient verbalized understanding of instructions,  educational materials, and care plan provided today and DECLINED offer to receive copy of patient instructions, educational materials, and care plan.   Barb Merino, RN, BSN, CCM Care Management Coordinator [facility] Care Management Direct Phone: [phone]

## 2023-02-17 NOTE — Patient Outreach (Signed)
  Care Coordination   Follow Up Visit Note   02/17/2023 Name: Richard Squyres MRN: [id number] DOB: 12/15/1932  Chang Donald is a 89 y.o. year old male who sees Minette Brine, [place] for primary care. I spoke with  [name] by phone today.  What matters to the patients health and wellness today?  Patient will pick up the Rx for Doxycycline and take it as directed. He will keep his new patient appointment with Dr. Zigmund Daniel, Urologist scheduled for 02/26/23.    Goals Addressed             This Visit's Progress    To schedule a folllow up with [facility] for evaluation of elevated PSA       Care Coordination Interventions: Evaluation of current treatment plan related to elevated PSA with symptoms of impaired urinary elimination and patient's adherence to plan as established by provider Discussed with patient he is continues to experience painful urination with burning, no blood, no discharge, no fever, no other symptoms Determined patient did not receive an Rx for Doxycycline as recommended by PCP Determined patient did however, schedule a new patient appointment to see Dr. Zigmund Daniel with [facility] for further evaluation of symptoms and elevated PSA Collaborated with PCP Minette Brine FNP regarding ongoing symptoms, advised patient did not receive Rx for Doxycycline  Determined PCP will send in new Rx for this antibiotic for patient to start ASAP Educated patient regarding the indication, dosage and frequency of this medication, instructed patient to complete the full course of medicine even if his symptoms improve and he verbalizes understanding of these instructions  Counseled patient on the importance of keeping his new patient appointment with Dr. Zigmund Daniel as directed      Interventions Today    Flowsheet Row Most Recent Value  Chronic Disease   Chronic disease during today's visit Other  [Impaired urinary elimination]  General Interventions   General Interventions  Discussed/Reviewed General Interventions Discussed, General Interventions Reviewed, Doctor Visits, Labs  Doctor Visits Discussed/Reviewed Doctor Visits Reviewed, Doctor Visits Discussed, PCP, Specialist  PCP/Specialist Visits Compliance with follow-up visit  Education Interventions   Education Provided Provided Education  Provided Verbal Education On Labs, Medication  Pharmacy Interventions   Pharmacy Dicussed/Reviewed Medications and their functions, Pharmacy Topics Discussed          SDOH assessments and interventions completed:  No     Care Coordination Interventions:  Yes, provided   Follow up plan: Follow up call scheduled for 02/28/23 @1  PM    Encounter Outcome:  Pt. Visit Completed

## 2023-02-24 ENCOUNTER — Other Ambulatory Visit: Payer: Self-pay | Admitting: Internal Medicine

## 2023-02-26 DIAGNOSIS — N401 Enlarged prostate with lower urinary tract symptoms: Secondary | ICD-10-CM | POA: Diagnosis not present

## 2023-02-26 DIAGNOSIS — R6889 Other general symptoms and signs: Secondary | ICD-10-CM | POA: Diagnosis not present

## 2023-02-26 DIAGNOSIS — R300 Dysuria: Secondary | ICD-10-CM | POA: Diagnosis not present

## 2023-02-26 DIAGNOSIS — R972 Elevated prostate specific antigen [PSA]: Secondary | ICD-10-CM | POA: Diagnosis not present

## 2023-02-26 DIAGNOSIS — R3912 Poor urinary stream: Secondary | ICD-10-CM | POA: Diagnosis not present

## 2023-02-28 DIAGNOSIS — R6889 Other general symptoms and signs: Secondary | ICD-10-CM | POA: Diagnosis not present

## 2023-03-01 DIAGNOSIS — I10 Essential (primary) hypertension: Secondary | ICD-10-CM | POA: Diagnosis not present

## 2023-03-05 ENCOUNTER — Ambulatory Visit: Payer: Medicare HMO | Admitting: Nurse Practitioner

## 2023-03-14 DIAGNOSIS — R6889 Other general symptoms and signs: Secondary | ICD-10-CM | POA: Diagnosis not present

## 2023-03-15 ENCOUNTER — Other Ambulatory Visit: Payer: Self-pay | Admitting: Internal Medicine

## 2023-03-17 DIAGNOSIS — R6889 Other general symptoms and signs: Secondary | ICD-10-CM | POA: Diagnosis not present

## 2023-03-19 ENCOUNTER — Other Ambulatory Visit: Payer: Self-pay

## 2023-03-19 DIAGNOSIS — R6889 Other general symptoms and signs: Secondary | ICD-10-CM | POA: Diagnosis not present

## 2023-03-19 MED ORDER — METHIMAZOLE 5 MG PO TABS: 5.0000 mg | ORAL_TABLET | Freq: Every day | ORAL | 0 refills | Status: DC

## 2023-03-19 NOTE — Addendum Note (Signed)
Addended by: Shelby Dubin on: 03/19/2023 12:39 PM   Modules accepted: Orders

## 2023-03-20 ENCOUNTER — Ambulatory Visit: Payer: Self-pay

## 2023-03-20 NOTE — Patient Outreach (Signed)
  Care Coordination   03/20/2023 Name: Jesus Lam MRN: [id number] DOB: 1933-02-21   Care Coordination Outreach Attempts:  An unsuccessful telephone outreach was attempted for a scheduled appointment today.  Follow Up Plan:  Additional outreach attempts will be made to offer the patient care coordination information and services.   Encounter Outcome:  No Answer   Care Coordination Interventions:  No, not indicated    [name], RN, BSN, CCM Care Management Coordinator [facility] Care Management  Direct Phone: [phone]

## 2023-03-21 DIAGNOSIS — R6889 Other general symptoms and signs: Secondary | ICD-10-CM | POA: Diagnosis not present

## 2023-03-24 ENCOUNTER — Other Ambulatory Visit: Payer: Self-pay | Admitting: Cardiology

## 2023-03-24 DIAGNOSIS — I10 Essential (primary) hypertension: Secondary | ICD-10-CM

## 2023-03-24 DIAGNOSIS — I739 Peripheral vascular disease, unspecified: Secondary | ICD-10-CM

## 2023-03-31 DIAGNOSIS — I10 Essential (primary) hypertension: Secondary | ICD-10-CM | POA: Diagnosis not present

## 2023-04-03 ENCOUNTER — Other Ambulatory Visit: Payer: Self-pay

## 2023-04-03 ENCOUNTER — Telehealth: Payer: Self-pay

## 2023-04-03 MED ORDER — HYDRALAZINE HCL 50 MG PO TABS: 50.0000 mg | ORAL_TABLET | Freq: Three times a day (TID) | ORAL | 3 refills | Status: DC

## 2023-04-03 MED ORDER — AMLODIPINE BESYLATE 5 MG PO TABS: 5.0000 mg | ORAL_TABLET | Freq: Every day | ORAL | 3 refills | Status: DC

## 2023-04-03 NOTE — Telephone Encounter (Signed)
Should this patient be on Plavix and Eliquis? I see both on his list.

## 2023-04-03 NOTE — Telephone Encounter (Signed)
Patient called back and was informed about the message above.

## 2023-04-03 NOTE — Telephone Encounter (Signed)
Called patient no answer left a vm

## 2023-04-03 NOTE — Telephone Encounter (Signed)
I last saw the patient in 03/2022. He was on plavix for his peripheral artery disease. It looks like he was started on Eliquis in 10.2023 for DVT. I am not sure if the duration of required. If he will be contiuing Eliquis, can stop Plavix to reduce bleeding risk.  Thanks MJP

## 2023-04-04 DIAGNOSIS — H903 Sensorineural hearing loss, bilateral: Secondary | ICD-10-CM | POA: Diagnosis not present

## 2023-04-11 ENCOUNTER — Ambulatory Visit: Payer: Self-pay

## 2023-04-11 NOTE — Patient Instructions (Signed)
Visit Information  Thank you for taking time to visit with me today. Please don't hesitate to contact me if I can be of assistance to you.   Following are the goals we discussed today:   Goals Addressed             This Visit's Progress    To schedule a folllow up with [facility] for evaluation of elevated PSA       Care Coordination Interventions: Evaluation of current treatment plan related to elevated PSA with symptoms of impaired urinary elimination and patient's adherence to plan as established by provider Determined patient completed his Urology follow up for evaluation of Impaired Urinary Elimination  Review of patient status, including review of consultant's reports, relevant laboratory and other test results, and medications completed Determined patient does not believe he is taking Tamsulosin as recently prescribed by Urologist Placed outbound call to [facility], confirmed with pharmacist that patient has been taking Tamsulosin as prescribed and this started on 03/11/23 Informed patient his Tamsulosin was filled and added to his pill package over the past month Determined patient feels this medication has been ineffective for his symptoms, reports no improvement Reviewed and discussed with patient MD recommendations include to report ineffectiveness of this medication to the Urologist and schedule an earlier follow up appointment and patient verbalizes understanding Discussed with patient he has the contact information for this provider and will call to schedule a follow up visit         Our next appointment is by telephone on 05/12/23 at 11:45 AM  Please call the care guide team at [phone] if you need to cancel or reschedule your appointment.   If you are experiencing a Mental Health or Behavioral Health Crisis or need someone to talk to, please call 1-800-273-TALK (toll free, 24 hour hotline) go to [facility] [address] [place] [phone])  The patient verbalized understanding of instructions, educational materials, and care plan provided today and DECLINED offer to receive copy of patient instructions, educational materials, and care plan.   [name], RN, BSN, CCM Care Management Coordinator [facility] Care Management Direct Phone: [phone]

## 2023-04-11 NOTE — Patient Outreach (Signed)
  Care Coordination   Follow Up Visit Note   04/11/2023 Name: Jesus Lam MRN: [id number] DOB: [birth year]  Jesus Lam is a 89 y.o. year old male who sees [name], FNP for primary care. I spoke with  Jesus Lam by phone today.  What matters to the patients health and wellness today?  Patient would like to get relief from his nocturia and burning with urination. He will contact his Urologist to schedule a follow up appointment.     Goals Addressed             This Visit's Progress    To schedule a folllow up with [facility] for evaluation of elevated PSA       Care Coordination Interventions: Evaluation of current treatment plan related to elevated PSA with symptoms of impaired urinary elimination and patient's adherence to plan as established by provider Determined patient completed his Urology follow up for evaluation of Impaired Urinary Elimination  Review of patient status, including review of consultant's reports, relevant laboratory and other test results, and medications completed Determined patient does not believe he is taking Tamsulosin as recently prescribed by Urologist Placed outbound call to [facility], confirmed with pharmacist that patient has been taking Tamsulosin as prescribed and this started on 03/11/23 Informed patient his Tamsulosin was filled and added to his pill package over the past month Determined patient feels this medication has been ineffective for his symptoms, reports no improvement Reviewed and discussed with patient MD recommendations include to report ineffectiveness of this medication to the Urologist and schedule an earlier follow up appointment and patient verbalizes understanding Discussed with patient he has the contact information for this provider and will call to schedule a follow up visit     Interventions Today    Flowsheet Row Most Recent Value  Chronic Disease   Chronic disease during today's visit Other   [elevated PSA]  General Interventions   General Interventions Discussed/Reviewed General Interventions Discussed, General Interventions Reviewed, Doctor Visits  Doctor Visits Discussed/Reviewed Doctor Visits Discussed, Doctor Visits Reviewed, Specialist  Education Interventions   Education Provided Provided Education  Provided Verbal Education On When to see the doctor, Medication  Pharmacy Interventions   Pharmacy Dicussed/Reviewed Pharmacy Topics Discussed, Medications and their functions, Pharmacy Topics Reviewed          SDOH assessments and interventions completed:  No     Care Coordination Interventions:  Yes, provided   Follow up plan: Follow up call scheduled for 05/12/23 @11 :45 AM    Encounter Outcome:  Pt. Visit Completed

## 2023-04-14 ENCOUNTER — Other Ambulatory Visit: Payer: Self-pay | Admitting: Internal Medicine

## 2023-05-12 ENCOUNTER — Ambulatory Visit: Payer: Self-pay

## 2023-05-12 NOTE — Patient Instructions (Signed)
Visit Information  Thank you for taking time to visit with me today. Please don't hesitate to contact me if I can be of assistance to you.   Following are the goals we discussed today:   Goals Addressed             This Visit's Progress    To schedule a folllow up with [facility] for evaluation of elevated PSA       Care Coordination Interventions: Completed successful outbound call with daughter [name]  Evaluation of current treatment plan related to elevated PSA with symptoms of impaired urinary elimination and patient's adherence to plan as established by provider Discussed patient's recent follow up with [facility], patient's daughter was unable to attend this visit Review of patient status, including review of consultant's reports, relevant laboratory and other test results, and medications completed Reviewed next upcoming scheduled follow up with [facility] with daughter [name] scheduled for 08/25/23 @10 :00 AM        Our next appointment is by telephone on 06/06/23 at 11:00 AM  Please call the care guide team at [phone] if you need to cancel or reschedule your appointment.   If you are experiencing a Mental Health or Behavioral Health Crisis or need someone to talk to, please call 1-800-273-TALK (toll free, 24 hour hotline)  The patient verbalized understanding of instructions, educational materials, and care plan provided today and DECLINED offer to receive copy of patient instructions, educational materials, and care plan.   [name], RN, BSN, CCM Care Management Coordinator [facility] Care Management Direct Phone: [phone]

## 2023-05-12 NOTE — Patient Outreach (Signed)
  Care Coordination   Follow Up Visit Note   05/12/2023 Name: Jarriel Schlueter MRN: [id number] DOB: [birth year]  Regnald Szczepanski is a 89 y.o. year old male who sees [name], FNP for primary care. I spoke with daughter [name] by phone today.  What matters to the patients health and wellness today?  Patient's daughter will accompany him at his next scheduled Urology appointment.     Goals Addressed             This Visit's Progress    To schedule a folllow up with [facility] for evaluation of elevated PSA       Care Coordination Interventions: Completed successful outbound call with daughter [name]  Evaluation of current treatment plan related to elevated PSA with symptoms of impaired urinary elimination and patient's adherence to plan as established by provider Discussed patient's recent follow up with [facility], patient's daughter was unable to attend this visit Review of patient status, including review of consultant's reports, relevant laboratory and other test results, and medications completed Reviewed next upcoming scheduled follow up with [facility] with daughter [name] scheduled for 08/25/23 @10 :00 AM    Interventions Today    Flowsheet Row Most Recent Value  Chronic Disease   Chronic disease during today's visit Other  [prostate issues]  General Interventions   General Interventions Discussed/Reviewed General Interventions Discussed, General Interventions Reviewed, Doctor Visits  Doctor Visits Discussed/Reviewed Doctor Visits Reviewed, Doctor Visits Discussed, Specialist  Education Interventions   Education Provided Provided Education  Provided Verbal Education On When to see the doctor, Labs, Medication  Pharmacy Interventions   Pharmacy Dicussed/Reviewed Pharmacy Topics Discussed, Medications and their functions, Pharmacy Topics Reviewed          SDOH assessments and interventions completed:  No     Care Coordination Interventions:  Yes,  provided   Follow up plan: Follow up call scheduled for 06/06/23 @11 :00 AM    Encounter Outcome:  Pt. Visit Completed

## 2023-06-04 ENCOUNTER — Encounter: Payer: Self-pay | Admitting: Nurse Practitioner

## 2023-06-04 ENCOUNTER — Ambulatory Visit: Payer: Medicare HMO

## 2023-06-04 ENCOUNTER — Ambulatory Visit: Payer: Medicare HMO | Admitting: Nurse Practitioner

## 2023-06-04 VITALS — BP 122/70 | HR 63 | Temp 98.4°F | Ht 72.0 in | Wt 239.0 lb

## 2023-06-04 VITALS — BP 120/60 | HR 63 | Temp 98.4°F | Ht 72.0 in | Wt 239.4 lb

## 2023-06-04 DIAGNOSIS — Z6832 Body mass index (BMI) 32.0-32.9, adult: Secondary | ICD-10-CM

## 2023-06-04 DIAGNOSIS — E7800 Pure hypercholesterolemia, unspecified: Secondary | ICD-10-CM

## 2023-06-04 DIAGNOSIS — I10 Essential (primary) hypertension: Secondary | ICD-10-CM

## 2023-06-04 DIAGNOSIS — H9193 Unspecified hearing loss, bilateral: Secondary | ICD-10-CM | POA: Diagnosis not present

## 2023-06-04 DIAGNOSIS — G3184 Mild cognitive impairment, so stated: Secondary | ICD-10-CM | POA: Diagnosis not present

## 2023-06-04 DIAGNOSIS — Z23 Encounter for immunization: Secondary | ICD-10-CM | POA: Diagnosis not present

## 2023-06-04 DIAGNOSIS — I129 Hypertensive chronic kidney disease with stage 1 through stage 4 chronic kidney disease, or unspecified chronic kidney disease: Secondary | ICD-10-CM | POA: Diagnosis not present

## 2023-06-04 DIAGNOSIS — R413 Other amnesia: Secondary | ICD-10-CM

## 2023-06-04 DIAGNOSIS — N1831 Chronic kidney disease, stage 3a: Secondary | ICD-10-CM | POA: Diagnosis not present

## 2023-06-04 DIAGNOSIS — R82998 Other abnormal findings in urine: Secondary | ICD-10-CM

## 2023-06-04 DIAGNOSIS — Z974 Presence of external hearing-aid: Secondary | ICD-10-CM

## 2023-06-04 DIAGNOSIS — E6609 Other obesity due to excess calories: Secondary | ICD-10-CM | POA: Diagnosis not present

## 2023-06-04 DIAGNOSIS — Z0000 Encounter for general adult medical examination without abnormal findings: Secondary | ICD-10-CM | POA: Diagnosis not present

## 2023-06-04 DIAGNOSIS — R300 Dysuria: Secondary | ICD-10-CM | POA: Diagnosis not present

## 2023-06-04 LAB — POCT URINALYSIS DIP (CLINITEK)
Bilirubin, UA: NEGATIVE
Glucose, UA: NEGATIVE mg/dL
Ketones, POC UA: NEGATIVE mg/dL
Nitrite, UA: NEGATIVE
POC PROTEIN,UA: 100 — AB
Spec Grav, UA: 1.015 (ref 1.010–1.025)
Urobilinogen, UA: 0.2 E.U./dL
pH, UA: 6 (ref 5.0–8.0)

## 2023-06-04 NOTE — Progress Notes (Signed)
Subjective:   Jesus Lam is a 88 y.o. male who presents for Medicare Annual/Subsequent preventive examination.  Visit Complete: In person    Review of Systems     Cardiac Risk Factors include: advanced age (>75men, >50 women);dyslipidemia;hypertension;male gender;obesity (BMI >30kg/m2)     Objective:    Today's Vitals   06/04/23 1135 06/04/23 1159  BP: (!) 160/70 120/60  Pulse: 63   Temp: 98.4 F (36.9 C)   TempSrc: Oral   SpO2: 94%   Weight: 239 lb 6.4 oz (108.6 kg)   Height: 6' (1.829 m)   PainSc: 9     Body mass index is 32.47 kg/m.     06/04/2023   11:48 AM 10/30/2022   12:53 PM 09/05/2022   10:38 AM 07/27/2022   11:57 PM 02/14/2022    2:24 PM 02/08/2021    2:27 PM 02/03/2020    2:48 PM  Advanced Directives  Does Patient Have a Medical Advance Directive? No Yes No No No No No  Would patient like information on creating a medical advance directive? No - Patient declined  No - Patient declined No - Patient declined Yes (MAU/Ambulatory/Procedural Areas - Information given)      Current Medications (verified) Outpatient Encounter Medications as of 06/04/2023  Medication Sig   acetaminophen (TYLENOL 8 HOUR ARTHRITIS PAIN) 650 MG CR tablet Take 1 tablet (650 mg total) by mouth 2 (two) times daily.   amLODipine (NORVASC) 5 MG tablet Take 1 tablet (5 mg total) by mouth daily.   atorvastatin (LIPITOR) 40 MG tablet TAKE TWO TABLETS BY MOUTH DAILY   azelastine (ASTELIN) 0.1 % nasal spray Place 2 sprays into both nostrils 2 (two) times daily. Use in each nostril as directed   bisoprolol (ZEBETA) 5 MG tablet TAKE HALF TABLET BY MOUTH DAILY   chlorthalidone (HYGROTON) 25 MG tablet Take 1 tablet (25 mg total) by mouth daily.   Cholecalciferol (VITAMIN D3) 5000 units CAPS Take 5,000 Units by mouth daily.   clopidogrel (PLAVIX) 75 MG tablet TAKE ONE TABLET BY MOUTH DAILY   diclofenac Sodium (VOLTAREN) 1 % GEL APPLY TWO GRAMS TOPICALLY FOUR TIMES A DAY   donepezil (ARICEPT)  10 MG tablet TAKE 1 TABLET AT BEDTIME   furosemide (LASIX) 20 MG tablet TAKE ONE TABLET BY MOUTH DAILY   gabapentin (NEURONTIN) 300 MG capsule TAKE ONE CAPSULE BY MOUTH AT BEDTIME   hydrALAZINE (APRESOLINE) 50 MG tablet Take 1 tablet (50 mg total) by mouth 3 (three) times daily.   icosapent Ethyl (VASCEPA) 1 g capsule TAKE ONE CAPSULE BY MOUTH TWICE A DAY   isosorbide mononitrate (IMDUR) 30 MG 24 hr tablet TAKE ONE TABLET BY MOUTH TWICE A DAY   ketorolac (ACULAR) 0.5 % ophthalmic solution ketorolac 0.5 % eye drops   levocetirizine (XYZAL) 5 MG tablet TAKE ONE TABLET BY MOUTH EVERY EVENING   Menthol-Methyl Salicylate (MUSCLE RUB) 10-15 % CREA muscle rub   methimazole (TAPAZOLE) 5 MG tablet Take 1 tablet (5 mg total) by mouth daily.   Multiple Vitamins-Minerals (COMPLETE SENIOR PO) 1 tablet daily.    MYRBETRIQ 25 MG TB24 tablet TAKE ONE TABLET BY MOUTH DAILY   tamsulosin (FLOMAX) 0.4 MG CAPS capsule TAKE ONE CAPSULE BY MOUTH DAILY HALF HOUR FOLLOWING THE SAME MEAL DAILY   vitamin B-12 (CYANOCOBALAMIN) 500 MCG tablet Take 500 mcg by mouth daily.   vitamin C (ASCORBIC ACID) 500 MG tablet Take 500 mg daily by mouth.   doxycycline (MONODOX) 100 MG capsule Take 1 capsule (  100 mg total) by mouth 2 (two) times daily. (Patient not taking: Reported on 06/04/2023)   Elastic Bandages & Supports (MEDICAL COMPRESSION STOCKINGS) MISC    Facility-Administered Encounter Medications as of 06/04/2023  Medication   0.9 %  sodium chloride infusion    Allergies (verified) Shellfish allergy   History: Past Medical History:  Diagnosis Date   Anemia    BPH (benign prostatic hyperplasia)    Cataracts, bilateral    CVA (cerebral vascular accident) (HCC)    DVT (deep venous thrombosis) (HCC)    dvt in left leg   Dyslipidemia    Gout    Gout    HTN (hypertension)    Left hip pain 03/15/2020   Left leg pain    Osteoarthritis    PAD (peripheral artery disease) (HCC)    Stasis dermatitis    Stroke (HCC)     Vitamin D deficiency    Past Surgical History:  Procedure Laterality Date   ABDOMINAL AORTOGRAM N/A 01/13/2018   Procedure: ABDOMINAL AORTOGRAM;  Surgeon: Elder Negus, MD;  Location: [facility];  Service: Cardiovascular;  Laterality: N/A;   CATARACT EXTRACTION, BILATERAL     LEFT HEART CATH AND CORONARY ANGIOGRAPHY N/A 01/13/2018   Procedure: LEFT HEART CATH AND CORONARY ANGIOGRAPHY;  Surgeon: Elder Negus, MD;  Location: [facility];  Service: Cardiovascular;  Laterality: N/A;   LOWER EXTREMITY ANGIOGRAPHY N/A 01/13/2018   Procedure: LOWER EXTREMITY ANGIOGRAPHY;  Surgeon: Elder Negus, MD;  Location: [facility];  Service: Cardiovascular;  Laterality: N/A;   LOWER EXTREMITY ANGIOGRAPHY Right 01/27/2018   Procedure: LOWER EXTREMITY ANGIOGRAPHY;  Surgeon: Yates Decamp, MD;  Location: [facility];  Service: Cardiovascular;  Laterality: Right;   PERIPHERAL VASCULAR ATHERECTOMY Left 01/13/2018   Procedure: PERIPHERAL VASCULAR ATHERECTOMY;  Surgeon: Elder Negus, MD;  Location: [facility];  Service: Cardiovascular;  Laterality: Left;  SFA WITH PTA DRUG COATED BALLOON   PERIPHERAL VASCULAR INTERVENTION  01/27/2018   Procedure: PERIPHERAL VASCULAR INTERVENTION;  Surgeon: Yates Decamp, MD;  Location: [facility];  Service: Cardiovascular;;   Family History  Problem Relation Age of Onset   Cancer Mother    Cancer Father    Alcohol abuse Father    Breast cancer Daughter    Stroke Daughter    CVA Daughter    Social History   Socioeconomic History   Marital status: Single    Spouse name: Not on file   Number of children: 3   Years of education: Not on file   Highest education level: Not on file  Occupational History   Occupation: retired  Tobacco Use   Smoking status: Former    Packs/day: 0.25    Years: 0.50    Additional pack years: 0.00    Total pack years: 0.13    Types: Cigarettes    Quit date: 1980    Years since  quitting: 44.5   Smokeless tobacco: Never  Vaping Use   Vaping Use: Never used  Substance and Sexual Activity   Alcohol use: Yes    Alcohol/week: 6.0 - 7.0 standard drinks of alcohol    Types: 6 - 7 Shots of liquor per week    Comment: on occasion   Drug use: Yes    Types: Marijuana    Comment: last used x 2 hours ago   Sexual activity: Yes  Other Topics Concern   Not on file  Social History Narrative   **  Merged History Encounter **       Lives with daughter, [name]. 2 daughters live here in [place]. Son lives in [place]. 3/16- offered information on local food pantries as well as congregate meal sites. Information declined.   Social Determinants of Health   Financial Resource Strain: Low Risk  (06/04/2023)   Overall Financial Resource Strain (CARDIA)    Difficulty of Paying Living Expenses: Not hard at all  Food Insecurity: No Food Insecurity (06/04/2023)   Hunger Vital Sign    Worried About Running Out of Food in the Last Year: Never true    Ran Out of Food in the Last Year: Never true  Transportation Needs: No Transportation Needs (06/04/2023)   PRAPARE - Administrator, Civil Service (Medical): No    Lack of Transportation (Non-Medical): No  Physical Activity: Sufficiently Active (06/04/2023)   Exercise Vital Sign    Days of Exercise per Week: 3 days    Minutes of Exercise per Session: 60 min  Recent Concern: Physical Activity - Insufficiently Active (06/04/2023)   Exercise Vital Sign    Days of Exercise per Week: 3 days    Minutes of Exercise per Session: 20 min  Stress: No Stress Concern Present (06/04/2023)   [employer] of Occupational Health - Occupational Stress Questionnaire    Feeling of Stress : Not at all  Social Connections: Moderately Integrated (06/04/2023)   Social Connection and Isolation Panel [NHANES]    Frequency of Communication with Friends and Family: More than three times a week    Frequency of Social Gatherings with Friends  and Family: Three times a week    Attends Religious Services: 1 to 4 times per year    Active Member of Clubs or Organizations: Yes    Attends Banker Meetings: More than 4 times per year    Marital Status: Widowed    Tobacco Counseling Counseling given: Not Answered   Clinical Intake:  Pre-visit preparation completed: Yes  Pain : 0-10 Pain Score: 9  Pain Type: Chronic pain Pain Location: Knee Pain Orientation: Left Pain Descriptors / Indicators: Aching Pain Onset: More than a month ago Pain Frequency: Constant     Nutritional Status: BMI > 30  Obese Nutritional Risks: None Diabetes: No  How often do you need to have someone help you when you read instructions, pamphlets, or other written materials from your doctor or pharmacy?: 1 - Never  Interpreter Needed?: No  Information entered by :: NAllen LPN   Activities of Daily Living    06/04/2023   11:37 AM  In your present state of health, do you have any difficulty performing the following activities:  Hearing? 1  Comment had hearing aids. lost them  Vision? 1  Comment blurry at times  Difficulty concentrating or making decisions? 1  Walking or climbing stairs? 1  Comment due to knee  Dressing or bathing? 0  Doing errands, shopping? 0  Preparing Food and eating ? N  Using the Toilet? N  In the past six months, have you accidently leaked urine? Y  Do you have problems with loss of bowel control? N  Managing your Medications? N  Comment forgets sometimes  Managing your Finances? N  Housekeeping or managing your Housekeeping? Y    Patient Care Team: [name], FNP as PCP - General (General Practice) Harlan Stains, RPH (Inactive) (Pharmacist) Clarene Duke, Karma Lew, RN as [facility] Care Management  Indicate any recent Medical Services you may have  received from other than [facility] providers in the past year (date may be approximate).     Assessment:   This is a routine wellness  examination for Strummer.  Hearing/Vision screen Hearing Screening - Comments:: Lost hearing aids Vision Screening - Comments:: Regular eye exams, [facility]  Dietary issues and exercise activities discussed:     Goals Addressed             This Visit's Progress    Patient Stated       06/04/2023, stay alive and stay happy       Depression Screen    06/04/2023   11:50 AM 02/14/2022    2:26 PM 02/08/2021    2:28 PM 02/03/2020    2:49 PM 02/03/2020    2:11 PM 11/15/2019   11:48 AM 08/11/2019   11:09 AM  PHQ 2/9 Scores  PHQ - 2 Score 0 0 3 0 0 0 0  PHQ- 9 Score 4  9 0       Fall Risk    06/04/2023   11:49 AM 02/14/2022    2:25 PM 02/08/2021    2:27 PM 02/03/2020    2:48 PM 02/03/2020    2:11 PM  Fall Risk   Falls in the past year? 0 0 1 0 0  Comment   tripped over himself    Number falls in past yr: 0  0    Injury with Fall? 0  0    Risk for fall due to : Impaired mobility;Impaired balance/gait;Medication side effect Impaired mobility;Medication side effect Impaired balance/gait;Medication side effect Medication side effect   Follow up Falls prevention discussed;Falls evaluation completed Falls evaluation completed;Education provided;Falls prevention discussed Falls evaluation completed;Education provided;Falls prevention discussed Falls evaluation completed;Education provided;Falls prevention discussed     MEDICARE RISK AT HOME:  Medicare Risk at Home - 06/04/23 1149     Any stairs in or around the home? No    If so, are there any without handrails? No    Home free of loose throw rugs in walkways, pet beds, electrical cords, etc? Yes    Adequate lighting in your home to reduce risk of falls? Yes    Life alert? No    Use of a cane, walker or w/c? Yes    Grab bars in the bathroom? Yes    Shower chair or bench in shower? No    Elevated toilet seat or a handicapped toilet? No             TIMED UP AND GO:  Was the test performed?  Yes  Length of time to ambulate 10  feet: 7 sec Gait slow and steady with assistive device    Cognitive Function:        06/04/2023   11:52 AM 02/08/2021    2:34 PM 02/03/2020    2:52 PM 01/28/2019    3:29 PM  6CIT Screen  What Year? 4 points 0 points 4 points 0 points  What month? 0 points 0 points 0 points 0 points  What time? 0 points 0 points 0 points 0 points  Count back from 20 0 points 0 points 0 points 4 points  Months in reverse 4 points 4 points 4 points 0 points  Repeat phrase 8 points 6 points 6 points 0 points  Total Score 16 points 10 points 14 points 4 points    Immunizations Immunization History  Administered Date(s) Administered   Fluad Quad(high Dose 65+) 08/03/2020, 08/15/2021   Influenza, High Dose  Seasonal PF 10/29/2018, 08/11/2019   Moderna SARS-COV2 Booster Vaccination 11/15/2020   PFIZER(Purple Top)SARS-COV-2 Vaccination 01/17/2020, 01/29/2020   Pfizer Covid-19 Vaccine Bivalent Booster 6yrs & up 11/09/2021   Pneumococcal Polysaccharide-23 09/18/2021   Tdap 03/26/2022   Unspecified SARS-COV-2 Vaccination 05/04/2021   Zoster Recombinant(Shingrix) 02/14/2022, 01/20/2023    TDAP status: Up to date  Flu Vaccine status: Up to date  Pneumococcal vaccine status: Completed during today's visit.  Covid-19 vaccine status: Information provided on how to obtain vaccines.   Qualifies for Shingles Vaccine? Yes   Zostavax completed Yes   Shingrix Completed?: Yes  Screening Tests Health Maintenance  Topic Date Due   Pneumonia Vaccine 73+ Years old (2 of 2 - PCV) 09/18/2022   COVID-19 Vaccine (5 - 2023-24 season) 06/20/2023 (Originally 07/26/2022)   INFLUENZA VACCINE  06/26/2023   Medicare Annual Wellness (AWV)  06/03/2024   DTaP/Tdap/Td (2 - Td or Tdap) 03/26/2032   Zoster Vaccines- Shingrix  Completed   HPV VACCINES  Aged Out    Health Maintenance  Health Maintenance Due  Topic Date Due   Pneumonia Vaccine 8+ Years old (2 of 2 - PCV) 09/18/2022    Colorectal cancer screening: No  longer required.   Lung Cancer Screening: (Low Dose CT Chest recommended if Age 27-80 years, 20 pack-year currently smoking OR have quit w/in 15years.) does not qualify.   Lung Cancer Screening Referral: no  Additional Screening:  Hepatitis C Screening: does not qualify;   Vision Screening: Recommended annual ophthalmology exams for early detection of glaucoma and other disorders of the eye. Is the patient up to date with their annual eye exam?  Yes  Who is the provider or what is the name of the office in which the patient attends annual eye exams? [facility] If pt is not established with a provider, would they like to be referred to a provider to establish care? No .   Dental Screening: Recommended annual dental exams for proper oral hygiene  Diabetic Foot Exam: n/a  Community Resource Referral / Chronic Care Management: CRR required this visit?  No   CCM required this visit?  No     Plan:     I have personally reviewed and noted the following in the patient's chart:   Medical and social history Use of alcohol, tobacco or illicit drugs  Current medications and supplements including opioid prescriptions. Patient is not currently taking opioid prescriptions. Functional ability and status Nutritional status Physical activity Advanced directives List of other physicians Hospitalizations, surgeries, and ER visits in previous 12 months Vitals Screenings to include cognitive, depression, and falls Referrals and appointments  In addition, I have reviewed and discussed with patient certain preventive protocols, quality metrics, and best practice recommendations. A written personalized care plan for preventive services as well as general preventive health recommendations were provided to patient.     Barb Merino, LPN   1/61/0960   After Visit Summary: in person  Nurse Notes: none

## 2023-06-04 NOTE — Patient Instructions (Signed)
Jesus Lam , Thank you for taking time to come for your Medicare Wellness Visit. I appreciate your ongoing commitment to your health goals. Please review the following plan we discussed and let me know if I can assist you in the future.   These are the goals we discussed:  Goals       Exercise 150 min/wk Moderate Activity      02/03/2020[facility]      Manage My Medicine      Timeframe:  Long-Range Goal Priority:  High Start Date:                             Expected End Date:                       Follow Up Date 10/16/2022   In Progress:   - call for medicine refill 2 or 3 days before it runs out - call if I am sick and can't take my medicine - keep a list of all the medicines I take; vitamins and herbals too - use an alarm clock or phone to remind me to take my medicine    Why is this important?   These steps will help you keep on track with your medicines.   Notes:  Please call the pharmacy team with any questions you may have.       Patient Stated (pt-stated)      Wants to get better with memory      Patient Stated      02/08/2021, wants to get better      Patient Stated      02/14/2022, wants to get better      Patient Stated      06/04/2023, stay alive and stay happy      To schedule a folllow up with [facility] for evaluation of elevated PSA      Care Coordination Interventions: Completed successful outbound call with daughter [name]  Evaluation of current treatment plan related to elevated PSA with symptoms of impaired urinary elimination and patient's adherence to plan as established by provider Discussed patient's recent follow up with [facility], patient's daughter was unable to attend this visit Review of patient status, including review of consultant's reports, relevant laboratory and other test results, and medications completed Reviewed next upcoming scheduled follow up with [facility] with daughter [name] scheduled  for 08/25/23 @10 :00 AM      Track and Manage My Blood Pressure-Hypertension      Timeframe:  Long-Range Goal Priority:  High Start Date:                             Expected End Date:                       Follow Up Date 10/16/2022   In Progress: - check blood pressure daily - write blood pressure results in a log or diary    Why is this important?   You won't feel high blood pressure, but it can still hurt your blood vessels.  High blood pressure can cause heart or kidney problems. It can also cause a stroke.  Making lifestyle changes like losing a little weight or eating less salt will help.  Checking your blood pressure at home and at different times of the day  can help to control blood pressure.  If the doctor prescribes medicine remember to take it the way the doctor ordered.  Call the office if you cannot afford the medicine or if there are questions about it.     Notes:  Please call if you have any questions         This is a list of the screening recommended for you and due dates:  Health Maintenance  Topic Date Due   Pneumonia Vaccine (2 of 2 - PCV) 09/18/2022   COVID-19 Vaccine (5 - 2023-24 season) 06/20/2023*   Flu Shot  06/26/2023   Medicare Annual Wellness Visit  06/03/2024   DTaP/Tdap/Td vaccine (2 - Td or Tdap) 03/26/2032   Zoster (Shingles) Vaccine  Completed   HPV Vaccine  Aged Out  *Topic was postponed. The date shown is not the original due date.    Advanced directives: Advance directive discussed with you today. Even though you declined this today please call our office should you change your mind and we can give you the proper paperwork for you to fill out.  Conditions/risks identified: none  Next appointment: Follow up in one year for your annual wellness visit.   Preventive Care 11 Years and Older, Male  Preventive care refers to lifestyle choices and visits with your health care provider that can promote health and wellness. What does  preventive care include? A yearly physical exam. This is also called an annual well check. Dental exams once or twice a year. Routine eye exams. Ask your health care provider how often you should have your eyes checked. Personal lifestyle choices, including: Daily care of your teeth and gums. Regular physical activity. Eating a healthy diet. Avoiding tobacco and drug use. Limiting alcohol use. Practicing safe sex. Taking low doses of aspirin every day. Taking vitamin and mineral supplements as recommended by your health care provider. What happens during an annual well check? The services and screenings done by your health care provider during your annual well check will depend on your age, overall health, lifestyle risk factors, and family history of disease. Counseling  Your health care provider may ask you questions about your: Alcohol use. Tobacco use. Drug use. Emotional well-being. Home and relationship well-being. Sexual activity. Eating habits. History of falls. Memory and ability to understand (cognition). Work and work Astronomer. Screening  You may have the following tests or measurements: Height, weight, and BMI. Blood pressure. Lipid and cholesterol levels. These may be checked every 5 years, or more frequently if you are over 38 years old. Skin check. Lung cancer screening. You may have this screening every year starting at age 63 if you have a 30-pack-year history of smoking and currently smoke or have quit within the past 15 years. Fecal occult blood test (FOBT) of the stool. You may have this test every year starting at age 74. Flexible sigmoidoscopy or colonoscopy. You may have a sigmoidoscopy every 5 years or a colonoscopy every 10 years starting at age 63. Prostate cancer screening. Recommendations will vary depending on your family history and other risks. Hepatitis C blood test. Hepatitis B blood test. Sexually transmitted disease (STD) testing. Diabetes  screening. This is done by checking your blood sugar (glucose) after you have not eaten for a while (fasting). You may have this done every 1-3 years. Abdominal aortic aneurysm (AAA) screening. You may need this if you are a current or former smoker. Osteoporosis. You may be screened starting at age 11 if you are at  high risk. Talk with your health care provider about your test results, treatment options, and if necessary, the need for more tests. Vaccines  Your health care provider may recommend certain vaccines, such as: Influenza vaccine. This is recommended every year. Tetanus, diphtheria, and acellular pertussis (Tdap, Td) vaccine. You may need a Td booster every 10 years. Zoster vaccine. You may need this after age 70. Pneumococcal 13-valent conjugate (PCV13) vaccine. One dose is recommended after age 71. Pneumococcal polysaccharide (PPSV23) vaccine. One dose is recommended after age 10. Talk to your health care provider about which screenings and vaccines you need and how often you need them. This information is not intended to replace advice given to you by your health care provider. Make sure you discuss any questions you have with your health care provider. Document Released: 12/08/2015 Document Revised: 07/31/2016 Document Reviewed: 09/12/2015 Elsevier Interactive Patient Education  2017 [employer].  Fall Prevention in the Home Falls can cause injuries. They can happen to people of all ages. There are many things you can do to make your home safe and to help prevent falls. What can I do on the outside of my home? Regularly fix the edges of walkways and driveways and fix any cracks. Remove anything that might make you trip as you walk through a door, such as a raised step or threshold. Trim any bushes or trees on the path to your home. Use bright outdoor lighting. Clear any walking paths of anything that might make someone trip, such as rocks or tools. Regularly check to see if  handrails are loose or broken. Make sure that both sides of any steps have handrails. Any raised decks and porches should have guardrails on the edges. Have any leaves, snow, or ice cleared regularly. Use sand or salt on walking paths during winter. Clean up any spills in your garage right away. This includes oil or grease spills. What can I do in the bathroom? Use night lights. Install grab bars by the toilet and in the tub and shower. Do not use towel bars as grab bars. Use non-skid mats or decals in the tub or shower. If you need to sit down in the shower, use a plastic, non-slip stool. Keep the floor dry. Clean up any water that spills on the floor as soon as it happens. Remove soap buildup in the tub or shower regularly. Attach bath mats securely with double-sided non-slip rug tape. Do not have throw rugs and other things on the floor that can make you trip. What can I do in the bedroom? Use night lights. Make sure that you have a light by your bed that is easy to reach. Do not use any sheets or blankets that are too big for your bed. They should not hang down onto the floor. Have a firm chair that has side arms. You can use this for support while you get dressed. Do not have throw rugs and other things on the floor that can make you trip. What can I do in the kitchen? Clean up any spills right away. Avoid walking on wet floors. Keep items that you use a lot in easy-to-reach places. If you need to reach something above you, use a strong step stool that has a grab bar. Keep electrical cords out of the way. Do not use floor polish or wax that makes floors slippery. If you must use wax, use non-skid floor wax. Do not have throw rugs and other things on the floor that can  make you trip. What can I do with my stairs? Do not leave any items on the stairs. Make sure that there are handrails on both sides of the stairs and use them. Fix handrails that are broken or loose. Make sure that  handrails are as long as the stairways. Check any carpeting to make sure that it is firmly attached to the stairs. Fix any carpet that is loose or worn. Avoid having throw rugs at the top or bottom of the stairs. If you do have throw rugs, attach them to the floor with carpet tape. Make sure that you have a light switch at the top of the stairs and the bottom of the stairs. If you do not have them, ask someone to add them for you. What else can I do to help prevent falls? Wear shoes that: Do not have high heels. Have rubber bottoms. Are comfortable and fit you well. Are closed at the toe. Do not wear sandals. If you use a stepladder: Make sure that it is fully opened. Do not climb a closed stepladder. Make sure that both sides of the stepladder are locked into place. Ask someone to hold it for you, if possible. Clearly mark and make sure that you can see: Any grab bars or handrails. First and last steps. Where the edge of each step is. Use tools that help you move around (mobility aids) if they are needed. These include: Canes. Walkers. Scooters. Crutches. Turn on the lights when you go into a dark area. Replace any light bulbs as soon as they burn out. Set up your furniture so you have a clear path. Avoid moving your furniture around. If any of your floors are uneven, fix them. If there are any pets around you, be aware of where they are. Review your medicines with your doctor. Some medicines can make you feel dizzy. This can increase your chance of falling. Ask your doctor what other things that you can do to help prevent falls. This information is not intended to replace advice given to you by your health care provider. Make sure you discuss any questions you have with your health care provider. Document Released: 09/07/2009 Document Revised: 04/18/2016 Document Reviewed: 12/16/2014 Elsevier Interactive Patient Education  2017 [employer].

## 2023-06-04 NOTE — Progress Notes (Signed)
Madelaine Bhat, CMA,acting as a Neurosurgeon for [name], FNP.,have documented all relevant documentation on the behalf of [name], FNP,as directed by  [name], FNP while in the presence of [name], FNP.  Subjective:  Patient ID: Jesus Lam , male    DOB: 04-06-1933 , 89 y.o.   MRN: [id number]  Chief Complaint  Patient presents with   Hypertension    HPI  Patient presents today for a chol and BP follow up. Patient reports compliance with medications. Patient denies any chest pain, SOB, or headaches. Patient reports he would like his ears checked and his knee is hurting.  BP Readings from Last 3 Encounters: 06/04/23 : (!) 160/70 06/04/23 : (!) 160/70 01/20/23 : Marland Kitchen 122/58      Past Medical History:  Diagnosis Date   Anemia    BPH (benign prostatic hyperplasia)    Cataracts, bilateral    CVA (cerebral vascular accident) (HCC)    DVT (deep venous thrombosis) (HCC)    dvt in left leg   Dyslipidemia    Gout    Gout    HTN (hypertension)    Left hip pain 03/15/2020   Left leg pain    Osteoarthritis    PAD (peripheral artery disease) (HCC)    Stasis dermatitis    Stroke (HCC)    Vitamin D deficiency      Family History  Problem Relation Age of Onset   Cancer Mother    Cancer Father    Alcohol abuse Father    Breast cancer Daughter    Stroke Daughter    CVA Daughter      Current Outpatient Medications:    acetaminophen (TYLENOL 8 HOUR ARTHRITIS PAIN) 650 MG CR tablet, Take 1 tablet (650 mg total) by mouth 2 (two) times daily., Disp: 60 tablet, Rfl: 5   amLODipine (NORVASC) 5 MG tablet, Take 1 tablet (5 mg total) by mouth daily., Disp: 180 tablet, Rfl: 3   atorvastatin (LIPITOR) 40 MG tablet, TAKE TWO TABLETS BY MOUTH DAILY, Disp: 90 tablet, Rfl: 5   azelastine (ASTELIN) 0.1 % nasal spray, Place 2 sprays into both nostrils 2 (two) times daily. Use in each nostril as directed, Disp: 30 mL, Rfl: 5   bisoprolol (ZEBETA) 5 MG tablet, TAKE HALF TABLET BY  MOUTH DAILY, Disp: 60 tablet, Rfl: 2   chlorthalidone (HYGROTON) 25 MG tablet, Take 1 tablet (25 mg total) by mouth daily., Disp: 5 tablet, Rfl: 0   Cholecalciferol (VITAMIN D3) 5000 units CAPS, Take 5,000 Units by mouth daily., Disp: , Rfl:    clopidogrel (PLAVIX) 75 MG tablet, TAKE ONE TABLET BY MOUTH DAILY, Disp: 90 tablet, Rfl: 0   diclofenac Sodium (VOLTAREN) 1 % GEL, APPLY TWO GRAMS TOPICALLY FOUR TIMES A DAY, Disp: 100 g, Rfl: 1   donepezil (ARICEPT) 10 MG tablet, TAKE 1 TABLET AT BEDTIME, Disp: 90 tablet, Rfl: 3   doxycycline (MONODOX) 100 MG capsule, Take 1 capsule (100 mg total) by mouth 2 (two) times daily. (Patient not taking: Reported on 06/04/2023), Disp: 14 capsule, Rfl: 0   Elastic Bandages & Supports (MEDICAL COMPRESSION STOCKINGS) MISC, , Disp: , Rfl:    furosemide (LASIX) 20 MG tablet, TAKE ONE TABLET BY MOUTH DAILY, Disp: 90 tablet, Rfl: 1   gabapentin (NEURONTIN) 300 MG capsule, TAKE ONE CAPSULE BY MOUTH AT BEDTIME, Disp: 90 capsule, Rfl: 0   hydrALAZINE (APRESOLINE) 50 MG tablet, Take 1 tablet (50 mg total) by mouth 3 (three) times daily., Disp: 270 tablet,  Rfl: 3   icosapent Ethyl (VASCEPA) 1 g capsule, TAKE ONE CAPSULE BY MOUTH TWICE A DAY, Disp: 60 capsule, Rfl: 0   isosorbide mononitrate (IMDUR) 30 MG 24 hr tablet, TAKE ONE TABLET BY MOUTH TWICE A DAY, Disp: 60 tablet, Rfl: 0   ketorolac (ACULAR) 0.5 % ophthalmic solution, ketorolac 0.5 % eye drops, Disp: , Rfl:    levocetirizine (XYZAL) 5 MG tablet, TAKE ONE TABLET BY MOUTH EVERY EVENING, Disp: 90 tablet, Rfl: 4   Menthol-Methyl Salicylate (MUSCLE RUB) 10-15 % CREA, muscle rub, Disp: , Rfl:    methimazole (TAPAZOLE) 5 MG tablet, Take 1 tablet (5 mg total) by mouth daily., Disp: 30 tablet, Rfl: 0   Multiple Vitamins-Minerals (COMPLETE SENIOR PO), 1 tablet daily. , Disp: , Rfl:    MYRBETRIQ 25 MG TB24 tablet, TAKE ONE TABLET BY MOUTH DAILY, Disp: 30 tablet, Rfl: 0   tamsulosin (FLOMAX) 0.4 MG CAPS capsule, TAKE ONE CAPSULE  BY MOUTH DAILY HALF HOUR FOLLOWING THE SAME MEAL DAILY, Disp: 90 capsule, Rfl: 4   vitamin B-12 (CYANOCOBALAMIN) 500 MCG tablet, Take 500 mcg by mouth daily., Disp: , Rfl:    vitamin C (ASCORBIC ACID) 500 MG tablet, Take 500 mg daily by mouth., Disp: , Rfl:   Current Facility-Administered Medications:    0.9 %  sodium chloride infusion, , Intravenous, PRN, Ghumman, Ramandeep, NP   Allergies  Allergen Reactions   Shellfish Allergy Other (See Comments)    Gout      Review of Systems  Constitutional: Negative.   Respiratory: Negative.    Cardiovascular: Negative.   Genitourinary:  Positive for dysuria.  Musculoskeletal:  Positive for arthralgias.  Psychiatric/Behavioral: Negative.    All other systems reviewed and are negative.    Today's Vitals   06/04/23 1156 06/04/23 1211  BP: (!) 160/70 122/70  Pulse: 63   Temp: 98.4 F (36.9 C)   TempSrc: Oral   Weight: 239 lb (108.4 kg)   Height: 6' (1.829 m)   PainSc: 9     Body mass index is 32.41 kg/m.  Wt Readings from Last 3 Encounters:  06/04/23 239 lb 6.4 oz (108.6 kg)  06/04/23 239 lb (108.4 kg)  01/20/23 237 lb (107.5 kg)    The ASCVD Risk score (Arnett DK, et al., 2019) failed to calculate for the following reasons:   The 2019 ASCVD risk score is only valid for ages 56 to 39   The patient has a prior MI or stroke diagnosis  Objective:  Physical Exam Vitals reviewed.  Constitutional:      General: He is not in acute distress.    Appearance: Normal appearance. He is obese.  Cardiovascular:     Rate and Rhythm: Normal rate and regular rhythm.     Pulses: Normal pulses.     Heart sounds: Normal heart sounds. No murmur heard. Pulmonary:     Effort: Pulmonary effort is normal. No respiratory distress.     Breath sounds: Normal breath sounds. No wheezing.  Musculoskeletal:        General: No swelling.  Skin:    General: Skin is warm and dry.     Capillary Refill: Capillary refill takes less than 2 seconds.   Neurological:     General: No focal deficit present.     Mental Status: He is alert and oriented to person, place, and time.  Psychiatric:        Mood and Affect: Mood normal.        Behavior: Behavior  normal.        Thought Content: Thought content normal.        Judgment: Judgment normal.         Assessment And Plan:  Benign hypertension without CHF Assessment & Plan: Blood pressure is well controlled, continue current medications   Pure hypercholesterolemia Assessment & Plan: Cholesterol  levels are stable. Continue statin   Orders: -     Lipid panel -     Basic metabolic panel  Stage 3a chronic kidney disease (HCC) Assessment & Plan: Continue to stay well hydrated with water and avoid NSAIDs   Mild cognitive impairment Assessment & Plan: 6CIT is 16 his memory is declining, will check for any metabolic causes. If normal will consider starting a medications  Orders: -     Vitamin B12 -     TSH -     VITAMIN D 25 Hydroxy (Vit-D Deficiency, Fractures) -     T pallidum Screening Cascade  Dysuria -     POCT URINALYSIS DIP (CLINITEK)  Bilateral hearing loss, unspecified hearing loss type Assessment & Plan: Will refer to ENT for further evaluation  Orders: -     Ambulatory referral to ENT  Does use hearing aid Assessment & Plan: Not currently wearing today  Orders: -     Ambulatory referral to ENT  Urine leukocytes Assessment & Plan: Will check urine culture, will treat with nitrofuratoin pending urine results  Orders: -     Urine Culture  Class 1 obesity due to excess calories with serious comorbidity and body mass index (BMI) of 32.0 to 32.9 in adult Assessment & Plan: She is encouraged to strive for BMI less than 30 to decrease cardiac risk. Advised to aim for at least 150 minutes of exercise per week.      Return for 6 month bp check.  Patient was given opportunity to ask questions. Patient verbalized understanding of the plan and was able  to repeat key elements of the plan. All questions were answered to their satisfaction.    Jeanell Sparrow, FNP, have reviewed all documentation for this visit. The documentation on 06/04/23 for the exam, diagnosis, procedures, and orders are all accurate and complete.   IF YOU HAVE BEEN REFERRED TO A SPECIALIST, IT MAY TAKE 1-2 WEEKS TO SCHEDULE/PROCESS THE REFERRAL. IF YOU HAVE NOT HEARD FROM US/SPECIALIST IN TWO WEEKS, PLEASE GIVE [place] A CALL AT [phone].

## 2023-06-06 ENCOUNTER — Ambulatory Visit: Payer: Self-pay

## 2023-06-06 NOTE — Patient Outreach (Signed)
  Care Coordination   Follow Up Visit Note   06/06/2023 Name: Jesus Lam MRN: [id number] DOB: Aug 26, 1933  Jesus Lam is a 89 y.o. year old male who sees [name], FNP for primary care. I spoke with  [name] by phone today.  What matters to the patients health and wellness today?  Patient will stop by his PCP office to get labs and urine collected.     Goals Addressed             This Visit's Progress    To complete lab visit       Care Coordination Interventions: Evaluation of current treatment plan related to Benign hypertension without CHF, Pure Hypercholesterolemia, dysuria, thyroid disease, change in memory and patient's adherence to plan as established by provider Reviewed and discussed with patient his recent PCP follow up with [name] FNP, determined patient's urinalysis is abnormal, however his urine culture and other labs ordered by PCP need to be collected Informed patient he may stop by the lab today or Monday to have these specimens collected and patient verbalizes understanding Discussed with patient this RN Care Coordinator will follow up with him telephonically following receipt of his lab/urine results and patient verbalizes understanding and is agreeable     Interventions Today    Flowsheet Row Most Recent Value  Chronic Disease   Chronic disease during today's visit Other  [abnormal urinalysis]  General Interventions   General Interventions Discussed/Reviewed General Interventions Discussed, General Interventions Reviewed, Labs, Doctor Visits  Doctor Visits Discussed/Reviewed Doctor Visits Discussed, Doctor Visits Reviewed, PCP  Education Interventions   Education Provided Provided Education  Provided Verbal Education On Labs          SDOH assessments and interventions completed:  No     Care Coordination Interventions:  Yes, provided   Follow up plan: Follow up call scheduled for 06/20/23 @10 :30 AM    Encounter Outcome:   Pt. Visit Completed

## 2023-06-06 NOTE — Patient Instructions (Signed)
Visit Information  Thank you for taking time to visit with me today. Please don't hesitate to contact me if I can be of assistance to you.   Following are the goals we discussed today:   Goals Addressed             This Visit's Progress    To complete lab visit       Care Coordination Interventions: Evaluation of current treatment plan related to Benign hypertension without CHF, Pure Hypercholesterolemia, dysuria, thyroid disease, change in memory and patient's adherence to plan as established by provider Reviewed and discussed with patient his recent PCP follow up with [name] FNP, determined patient's urinalysis is abnormal, however his urine culture and other labs ordered by PCP need to be collected Informed patient he may stop by the lab today or Monday to have these specimens collected and patient verbalizes understanding Discussed with patient this RN Care Coordinator will follow up with him telephonically following receipt of his lab/urine results and patient verbalizes understanding and is agreeable         Our next appointment is by telephone on 06/20/23 at 10:30 AM  Please call the care guide team at [phone] if you need to cancel or reschedule your appointment.   If you are experiencing a Mental Health or Behavioral Health Crisis or need someone to talk to, please call 1-800-273-TALK (toll free, 24 hour hotline)  The patient verbalized understanding of instructions, educational materials, and care plan provided today and DECLINED offer to receive copy of patient instructions, educational materials, and care plan.   [name], RN, BSN, CCM Care Management Coordinator [facility]  Direct Phone: [phone]

## 2023-06-09 DIAGNOSIS — E7800 Pure hypercholesterolemia, unspecified: Secondary | ICD-10-CM | POA: Diagnosis not present

## 2023-06-09 DIAGNOSIS — R82998 Other abnormal findings in urine: Secondary | ICD-10-CM | POA: Diagnosis not present

## 2023-06-09 DIAGNOSIS — R413 Other amnesia: Secondary | ICD-10-CM | POA: Diagnosis not present

## 2023-06-10 LAB — BASIC METABOLIC PANEL
BUN/Creatinine Ratio: 13 (ref 10–24)
BUN: 17 mg/dL (ref 8–27)
CO2: 21 mmol/L (ref 20–29)
Calcium: 9.4 mg/dL (ref 8.6–10.2)
Chloride: 110 mmol/L — ABNORMAL HIGH (ref 96–106)
Creatinine, Ser: 1.27 mg/dL (ref 0.76–1.27)
Glucose: 129 mg/dL — ABNORMAL HIGH (ref 70–99)
Potassium: 4.9 mmol/L (ref 3.5–5.2)
Sodium: 146 mmol/L — ABNORMAL HIGH (ref 134–144)
eGFR: 54 mL/min/{1.73_m2} — ABNORMAL LOW (ref >59)

## 2023-06-10 LAB — TSH: TSH: 0.042 u[IU]/mL — ABNORMAL LOW (ref 0.450–4.500)

## 2023-06-10 LAB — LIPID PANEL
Chol/HDL Ratio: 3.1 ratio (ref 0.0–5.0)
Cholesterol, Total: 98 mg/dL — ABNORMAL LOW (ref 100–199)
HDL: 32 mg/dL — ABNORMAL LOW (ref >39)
LDL Chol Calc (NIH): 42 mg/dL (ref 0–99)
Triglycerides: 133 mg/dL (ref 0–149)
VLDL Cholesterol Cal: 24 mg/dL (ref 5–40)

## 2023-06-10 LAB — VITAMIN B12: Vitamin B-12: >2000 pg/mL — ABNORMAL HIGH (ref 232–1245)

## 2023-06-10 LAB — T PALLIDUM SCREENING CASCADE: T pallidum Antibodies (TP-PA): NONREACTIVE

## 2023-06-10 LAB — VITAMIN D 25 HYDROXY (VIT D DEFICIENCY, FRACTURES): Vit D, 25-Hydroxy: 94.3 ng/mL (ref 30.0–100.0)

## 2023-06-13 LAB — URINE CULTURE

## 2023-06-17 ENCOUNTER — Encounter: Payer: Self-pay | Admitting: Nurse Practitioner

## 2023-06-17 DIAGNOSIS — R82998 Other abnormal findings in urine: Secondary | ICD-10-CM | POA: Insufficient documentation

## 2023-06-17 DIAGNOSIS — R300 Dysuria: Secondary | ICD-10-CM | POA: Insufficient documentation

## 2023-06-17 DIAGNOSIS — I10 Essential (primary) hypertension: Secondary | ICD-10-CM | POA: Insufficient documentation

## 2023-06-17 DIAGNOSIS — H9193 Unspecified hearing loss, bilateral: Secondary | ICD-10-CM | POA: Insufficient documentation

## 2023-06-17 DIAGNOSIS — E66811 Obesity, class 1: Secondary | ICD-10-CM | POA: Insufficient documentation

## 2023-06-17 DIAGNOSIS — Z974 Presence of external hearing-aid: Secondary | ICD-10-CM | POA: Insufficient documentation

## 2023-06-17 DIAGNOSIS — E6609 Other obesity due to excess calories: Secondary | ICD-10-CM | POA: Insufficient documentation

## 2023-06-17 HISTORY — DX: Dysuria: R30.0

## 2023-06-17 NOTE — Assessment & Plan Note (Signed)
Will refer to ENT for further evaluation.  

## 2023-06-17 NOTE — Assessment & Plan Note (Signed)
Will check urine culture, will treat with nitrofuratoin pending urine results

## 2023-06-17 NOTE — Assessment & Plan Note (Signed)
Continue to stay well hydrated with water and avoid NSAIDs

## 2023-06-17 NOTE — Assessment & Plan Note (Signed)
Blood pressure is well controlled, continue current medications.

## 2023-06-17 NOTE — Assessment & Plan Note (Signed)
Not currently wearing today

## 2023-06-17 NOTE — Assessment & Plan Note (Signed)
>>  ASSESSMENT AND PLAN FOR CLASS 1 OBESITY DUE TO EXCESS CALORIES WITH SERIOUS COMORBIDITY AND BODY MASS INDEX (BMI) OF 32.0 TO 32.9 IN ADULT WRITTEN ON 06/17/2023 11:07 PM BY GEORGINA SPEAKS, FNP  She is encouraged to strive for BMI less than 30 to decrease cardiac risk. Advised to aim for at least 150 minutes of exercise per week.

## 2023-06-17 NOTE — Assessment & Plan Note (Signed)
She is encouraged to strive for BMI less than 30 to decrease cardiac risk. Advised to aim for at least 150 minutes of exercise per week.  

## 2023-06-17 NOTE — Assessment & Plan Note (Signed)
6CIT is 16 his memory is declining, will check for any metabolic causes. If normal will consider starting a medications

## 2023-06-17 NOTE — Assessment & Plan Note (Signed)
Cholesterol  levels are stable. Continue statin

## 2023-06-20 ENCOUNTER — Ambulatory Visit: Payer: Self-pay

## 2023-06-20 NOTE — Patient Outreach (Signed)
  Care Coordination   06/20/2023 Name: Jesus Lam MRN: [id number] DOB: [birth year]   Care Coordination Outreach Attempts:  An unsuccessful telephone outreach was attempted for a scheduled appointment today.  Follow Up Plan:  Additional outreach attempts will be made to offer the patient care coordination information and services.   Encounter Outcome:  No Answer   Care Coordination Interventions:  No, not indicated    [name], RN, BSN, CCM Care Management Coordinator [facility] Care Management Direct Phone: [phone]

## 2023-06-23 ENCOUNTER — Other Ambulatory Visit: Payer: Self-pay | Admitting: Nurse Practitioner

## 2023-06-23 ENCOUNTER — Ambulatory Visit: Payer: Self-pay

## 2023-06-23 DIAGNOSIS — R350 Frequency of micturition: Secondary | ICD-10-CM

## 2023-06-23 DIAGNOSIS — R972 Elevated prostate specific antigen [PSA]: Secondary | ICD-10-CM

## 2023-06-23 DIAGNOSIS — R32 Unspecified urinary incontinence: Secondary | ICD-10-CM

## 2023-06-23 MED ORDER — DOXYCYCLINE MONOHYDRATE 100 MG PO CAPS: 100.0000 mg | ORAL_CAPSULE | Freq: Two times a day (BID) | ORAL | 0 refills | Status: DC

## 2023-06-23 NOTE — Patient Outreach (Addendum)
  Care Coordination   Follow Up Visit Note   06/23/2023 Name: Jesus Lam MRN: [id number] DOB: 14-May-1933  Jesus Lam is a 89 y.o. year old male who sees Jesus Felts, FNP for primary care. I spoke with  Jesus Lam by phone today.  What matters to the patients health and wellness today?  Patient will take his prescribed antibiotic for UTI as directed.     Goals Addressed             This Visit's Progress    COMPLETED: To complete lab visit       Care Coordination Interventions: Evaluation of current treatment plan related to Benign hypertension without CHF, Pure Hypercholesterolemia, dysuria, thyroid disease, change in memory and patient's adherence to plan as established by provider Determined patient completed his lab visit  Reviewed and discussed urine culture results with patient and advised PCP sent in Rx for antibiotic Instructed patient to complete the full course of his antibiotic and to take exactly as directed Instructed patient to contact his Urologist to make aware of #2 UTI since last Urology f/u and patient verbalizes understanding      To schedule a folllow up with [facility] for evaluation of elevated PSA       Care Coordination Interventions: Evaluation of current treatment plan related to elevated PSA with symptoms of impaired urinary elimination and patient's adherence to plan as established by provider Determined patient completed his f/u with Urology as directed Review of patient status, including review of consultant's reports, relevant laboratory and other test results, and medications completed Reviewed urine culture results with patient, advised PCP sent in Rx for antibiotic for treatment of UTI, instructed patient to contact his Urologist to make aware of #2 UTI since last Urology f/u and patient verbalizes understanding  Educated patient on importance of taking the full course of antibiotic as directed     Interventions Today     Flowsheet Row Most Recent Value  Chronic Disease   Chronic disease during today's visit Other  [UTI]  General Interventions   General Interventions Discussed/Reviewed General Interventions Discussed, General Interventions Reviewed, Doctor Visits, Labs, Communication with  Labs --  [urine culture]  Doctor Visits Discussed/Reviewed Doctor Visits Discussed, Doctor Visits Reviewed, PCP, Specialist  Communication with PCP/Specialists  Education Interventions   Education Provided Provided Education  Provided Verbal Education On Labs, Medication, When to see the doctor  Labs Reviewed --  [urine culture]  Pharmacy Interventions   Pharmacy Dicussed/Reviewed Pharmacy Topics Reviewed, Pharmacy Topics Discussed, Medications and their functions          SDOH assessments and interventions completed:  No     Care Coordination Interventions:  Yes, provided   Follow up plan: Follow up call scheduled for 07/21/23 @09 :30 AM    Encounter Outcome:  Pt. Visit Completed

## 2023-06-23 NOTE — Patient Instructions (Signed)
Visit Information  Thank you for taking time to visit with me today. Please don't hesitate to contact me if I can be of assistance to you.   Following are the goals we discussed today:   Goals Addressed             This Visit's Progress    COMPLETED: To complete lab visit       Care Coordination Interventions: Evaluation of current treatment plan related to Benign hypertension without CHF, Pure Hypercholesterolemia, dysuria, thyroid disease, change in memory and patient's adherence to plan as established by provider Determined patient completed his lab visit  Reviewed and discussed urine culture results with patient and advised PCP sent in Rx for antibiotic Instructed patient to complete the full course of his antibiotic and to take exactly as directed Instructed patient to contact his Urologist to make aware of #2 UTI since last Urology f/u and patient verbalizes understanding      To schedule a folllow up with [facility] for evaluation of elevated PSA       Care Coordination Interventions: Evaluation of current treatment plan related to elevated PSA with symptoms of impaired urinary elimination and patient's adherence to plan as established by provider Determined patient completed his f/u with Urology as directed Review of patient status, including review of consultant's reports, relevant laboratory and other test results, and medications completed Reviewed urine culture results with patient, advised PCP sent in Rx for antibiotic for treatment of UTI, instructed patient to contact his Urologist to make aware of #2 UTI since last Urology f/u and patient verbalizes understanding  Educated patient on importance of taking the full course of antibiotic as directed         Our next appointment is by telephone on 07/21/23 at 09:30 AM  Please call the care guide team at [phone] if you need to cancel or reschedule your appointment.   If you are experiencing a Mental Health or  Behavioral Health Crisis or need someone to talk to, please call 1-800-273-TALK (toll free, 24 hour hotline)  The patient verbalized understanding of instructions, educational materials, and care plan provided today and DECLINED offer to receive copy of patient instructions, educational materials, and care plan.   [name], RN, BSN, CCM Care Management Coordinator [facility] Care Management Direct Phone: [phone]

## 2023-06-24 ENCOUNTER — Other Ambulatory Visit: Payer: Self-pay | Admitting: Cardiology

## 2023-06-24 ENCOUNTER — Other Ambulatory Visit: Payer: Self-pay | Admitting: Nurse Practitioner

## 2023-06-24 DIAGNOSIS — M19041 Primary osteoarthritis, right hand: Secondary | ICD-10-CM

## 2023-06-24 DIAGNOSIS — I739 Peripheral vascular disease, unspecified: Secondary | ICD-10-CM

## 2023-07-11 ENCOUNTER — Other Ambulatory Visit: Payer: Self-pay

## 2023-07-11 MED ORDER — BISOPROLOL FUMARATE 5 MG PO TABS: 2.5000 mg | ORAL_TABLET | Freq: Every day | ORAL | 2 refills | Status: DC

## 2023-07-15 DIAGNOSIS — I739 Peripheral vascular disease, unspecified: Secondary | ICD-10-CM

## 2023-07-21 ENCOUNTER — Ambulatory Visit: Payer: Self-pay

## 2023-07-21 NOTE — Patient Outreach (Signed)
  Care Coordination   Follow Up Visit Note   07/21/2023 Name: Jesus Lam MRN: [id number] DOB: November 21, 1933  Jesus Lam is a 89 y.o. year old male who sees [name], FNP for primary care. I spoke with  Jesus Lam by phone today.  What matters to the patients health and wellness today?  Patient would like to have his lower extremity edema, change in appetite and fatigue evaluated by his PCP provider.     Goals Addressed             This Visit's Progress    To have lower extremity edema and sleepiness evaluated by PCP       Care Coordination Interventions: Spoke with patient is concerned about having new onset of swelling to his legs, ankles and feet bilaterally, not relieved with elevation of extremities Determined patient is also experiencing sleepiness throughout the daytime and this has resulted in an off schedule bedtime routine Discussed with patient, in addition, his appetite is poor and he feels full after only eating a few bites of a meal Determined patient would like to f/u with his PCP for further evaluation of his symptoms Collaborated with [name] FNP regarding patient's c/o, she will have CMA schedule an in person visit with herself or Ellender Hose NP       To schedule a folllow up with [facility] for evaluation of elevated PSA   On track    Care Coordination Interventions: Evaluation of current treatment plan related to elevated PSA with symptoms of impaired urinary elimination and patient's adherence to plan as established by provider Discussed with patient he completed his full course of antibiotics in late July as prescribed by PCP for symptoms suggestive of UTI Determined patient continues to have issues with impaired urinary elimination Reviewed and discussed with patient his next upcoming scheduled appointment with [facility] is scheduled for 08/02/23    Interventions Today    Flowsheet Row Most Recent Value  Chronic Disease    Chronic disease during today's visit Other  [Edema to bilateral LE,  fatigue/sleepiness,  change in appetite,  impaired urinary elimination]  General Interventions   General Interventions Discussed/Reviewed General Interventions Discussed, General Interventions Reviewed, Doctor Visits, Communication with  Doctor Visits Discussed/Reviewed Doctor Visits Reviewed, Doctor Visits Discussed, PCP, Specialist  Communication with PCP/Specialists  [name] FNP]  Education Interventions   Education Provided Provided Education  Provided Verbal Education On Nutrition, Medication, When to see the doctor  Nutrition Interventions   Nutrition Discussed/Reviewed Nutrition Discussed, Nutrition Reviewed  Pharmacy Interventions   Pharmacy Dicussed/Reviewed Pharmacy Topics Discussed, Pharmacy Topics Reviewed, Medications and their functions          SDOH assessments and interventions completed:  No     Care Coordination Interventions:  Yes, provided   Follow up plan: Follow up call scheduled for 08/04/23 @12 :30 PM    Encounter Outcome:  Pt. Visit Completed

## 2023-07-21 NOTE — Patient Instructions (Signed)
Visit Information  Thank you for taking time to visit with me today. Please don't hesitate to contact me if I can be of assistance to you.   Following are the goals we discussed today:   Goals Addressed             This Visit's Progress    To have lower extremity edema and sleepiness evaluated by PCP       Care Coordination Interventions: Spoke with patient is concerned about having new onset of swelling to his legs, ankles and feet bilaterally, not relieved with elevation of extremities Determined patient is also experiencing sleepiness throughout the daytime and this has resulted in an off schedule bedtime routine Discussed with patient, in addition, his appetite is poor and he feels full after only eating a few bites of a meal Determined patient would like to f/u with his PCP for further evaluation of his symptoms Collaborated with [name] FNP regarding patient's c/o, she will have CMA schedule an in person visit with herself or Ellender Hose NP       To schedule a folllow up with [facility] for evaluation of elevated PSA   On track    Care Coordination Interventions: Evaluation of current treatment plan related to elevated PSA with symptoms of impaired urinary elimination and patient's adherence to plan as established by provider Discussed with patient he completed his full course of antibiotics in late July as prescribed by PCP for symptoms suggestive of UTI Determined patient continues to have issues with impaired urinary elimination Reviewed and discussed with patient his next upcoming scheduled appointment with [facility] is scheduled for 08/02/23        Our next appointment is by telephone on 08/04/23 at 12:30 PM  Please call the care guide team at [phone] if you need to cancel or reschedule your appointment.   If you are experiencing a Mental Health or Behavioral Health Crisis or need someone to talk to, please call 1-800-273-TALK (toll free, 24 hour  hotline)  The patient verbalized understanding of instructions, educational materials, and care plan provided today and DECLINED offer to receive copy of patient instructions, educational materials, and care plan.   [name], RN, BSN, CCM Care Management Coordinator [facility] Care Management Direct Phone: [phone]

## 2023-07-22 ENCOUNTER — Ambulatory Visit: Payer: Self-pay | Admitting: Family Medicine

## 2023-07-23 ENCOUNTER — Other Ambulatory Visit: Payer: Self-pay | Admitting: Cardiology

## 2023-07-23 DIAGNOSIS — I739 Peripheral vascular disease, unspecified: Secondary | ICD-10-CM

## 2023-07-30 NOTE — Telephone Encounter (Signed)
This encounter was created in error - please disregard.

## 2023-07-31 ENCOUNTER — Ambulatory Visit: Payer: Medicare HMO | Admitting: Audiology

## 2023-08-04 ENCOUNTER — Ambulatory Visit: Payer: Self-pay

## 2023-08-04 NOTE — Patient Outreach (Signed)
  Care Coordination   08/04/2023 Name: Jesus Lam MRN: [id number] DOB: 1933/06/15   Care Coordination Outreach Attempts:  An unsuccessful telephone outreach was attempted for a scheduled appointment today.  Follow Up Plan:  Additional outreach attempts will be made to offer the patient care coordination information and services.   Encounter Outcome:  No Answer   Care Coordination Interventions:  No, not indicated    [name] RN BSN CCM [place]  [facility] Health Nurse Care Coordinator  Direct Dial: [phone] Website: [email or web address]

## 2023-08-05 ENCOUNTER — Ambulatory Visit: Payer: Self-pay

## 2023-08-05 NOTE — Patient Outreach (Signed)
  Care Coordination   Provider Collaboration   Visit Note   08/05/2023 Name: Jesus Lam MRN: [id number] DOB: December 06, 1932  Jesus Lam is a 89 y.o. year old male who sees [name], FNP for primary care. I collaborated with patient's PCP provider to notify her of patient's reported symptoms.   What matters to the patients health and wellness today?  Patient is c/o of dysuria and increased swelling.     Goals Addressed             This Visit's Progress    RN Care Coordination Activities: further follow up needed       Care Coordination Interventions: Received message from [facility] care guide Gwenevere Ghazi advising patient called in to report dysuria and increased swelling Advised Misty Stanley to direct patient to the ED for further evaluation  Discussed patient scheduled a nurse care coordination follow up call for 08/08/23 Sent in basket message to PCP provider [name] FNP notifying her of the above     Interventions Today    Flowsheet Row Most Recent Value  General Interventions   General Interventions Discussed/Reviewed Communication with  Communication with PCP/Specialists  [name] FNP]          SDOH assessments and interventions completed:  No     Care Coordination Interventions:  Yes, provided   Follow up plan: Follow up call scheduled for 08/08/23 @2 :30 PM    Encounter Outcome:  Patient Visit Completed

## 2023-08-06 ENCOUNTER — Emergency Department (HOSPITAL_COMMUNITY)
Admission: EM | Admit: 2023-08-06 | Discharge: 2023-08-06 | Disposition: A | Discharge status: Home / Self Care | Payer: Medicare HMO | Attending: Emergency Medicine | Admitting: Emergency Medicine

## 2023-08-06 ENCOUNTER — Other Ambulatory Visit: Payer: Self-pay

## 2023-08-06 ENCOUNTER — Emergency Department (HOSPITAL_COMMUNITY): Payer: Medicare HMO

## 2023-08-06 ENCOUNTER — Encounter (HOSPITAL_COMMUNITY): Payer: Self-pay

## 2023-08-06 ENCOUNTER — Emergency Department (HOSPITAL_BASED_OUTPATIENT_CLINIC_OR_DEPARTMENT_OTHER): Admit: 2023-08-06 | Discharge: 2023-08-06 | Disposition: A | Discharge status: Home / Self Care | Payer: Medicare HMO

## 2023-08-06 ENCOUNTER — Ambulatory Visit: Payer: Medicare HMO

## 2023-08-06 DIAGNOSIS — R918 Other nonspecific abnormal finding of lung field: Secondary | ICD-10-CM | POA: Diagnosis not present

## 2023-08-06 DIAGNOSIS — R609 Edema, unspecified: Secondary | ICD-10-CM

## 2023-08-06 DIAGNOSIS — R300 Dysuria: Secondary | ICD-10-CM | POA: Diagnosis present

## 2023-08-06 DIAGNOSIS — N390 Urinary tract infection, site not specified: Secondary | ICD-10-CM | POA: Diagnosis not present

## 2023-08-06 DIAGNOSIS — R0989 Other specified symptoms and signs involving the circulatory and respiratory systems: Secondary | ICD-10-CM | POA: Diagnosis not present

## 2023-08-06 DIAGNOSIS — Z7902 Long term (current) use of antithrombotics/antiplatelets: Secondary | ICD-10-CM | POA: Diagnosis not present

## 2023-08-06 DIAGNOSIS — R600 Localized edema: Secondary | ICD-10-CM | POA: Diagnosis not present

## 2023-08-06 DIAGNOSIS — I771 Stricture of artery: Secondary | ICD-10-CM | POA: Diagnosis not present

## 2023-08-06 DIAGNOSIS — R001 Bradycardia, unspecified: Secondary | ICD-10-CM | POA: Diagnosis not present

## 2023-08-06 DIAGNOSIS — R531 Weakness: Secondary | ICD-10-CM | POA: Diagnosis not present

## 2023-08-06 DIAGNOSIS — I10 Essential (primary) hypertension: Secondary | ICD-10-CM | POA: Diagnosis not present

## 2023-08-06 LAB — COMPREHENSIVE METABOLIC PANEL
ALT: 17 U/L (ref 0–44)
AST: 19 U/L (ref 15–41)
Albumin: 3.4 g/dL — ABNORMAL LOW (ref 3.5–5.0)
Alkaline Phosphatase: 72 U/L (ref 38–126)
Anion gap: 5 (ref 5–15)
BUN: 18 mg/dL (ref 8–23)
CO2: 27 mmol/L (ref 22–32)
Calcium: 9 mg/dL (ref 8.9–10.3)
Chloride: 111 mmol/L (ref 98–111)
Creatinine, Ser: 1.15 mg/dL (ref 0.61–1.24)
GFR, Estimated: >60 mL/min (ref >60)
Glucose, Bld: 102 mg/dL — ABNORMAL HIGH (ref 70–99)
Potassium: 4 mmol/L (ref 3.5–5.1)
Sodium: 143 mmol/L (ref 135–145)
Total Bilirubin: 0.6 mg/dL (ref 0.3–1.2)
Total Protein: 7.8 g/dL (ref 6.5–8.1)

## 2023-08-06 LAB — CBC WITH DIFFERENTIAL/PLATELET
Abs Immature Granulocytes: 0.02 10*3/uL (ref 0.00–0.07)
Basophils Absolute: 0 10*3/uL (ref 0.0–0.1)
Basophils Relative: 1 %
Eosinophils Absolute: 0.1 10*3/uL (ref 0.0–0.5)
Eosinophils Relative: 2 %
HCT: 36.6 % — ABNORMAL LOW (ref 39.0–52.0)
Hemoglobin: 11 g/dL — ABNORMAL LOW (ref 13.0–17.0)
Immature Granulocytes: 0 %
Lymphocytes Relative: 30 %
Lymphs Abs: 1.6 10*3/uL (ref 0.7–4.0)
MCH: 29.1 pg (ref 26.0–34.0)
MCHC: 30.1 g/dL (ref 30.0–36.0)
MCV: 96.8 fL (ref 80.0–100.0)
Monocytes Absolute: 0.6 10*3/uL (ref 0.1–1.0)
Monocytes Relative: 11 %
Neutro Abs: 3 10*3/uL (ref 1.7–7.7)
Neutrophils Relative %: 56 %
Platelets: 186 10*3/uL (ref 150–400)
RBC: 3.78 MIL/uL — ABNORMAL LOW (ref 4.22–5.81)
RDW: 13.7 % (ref 11.5–15.5)
WBC: 5.3 10*3/uL (ref 4.0–10.5)
nRBC: 0 % (ref 0.0–0.2)

## 2023-08-06 LAB — URINALYSIS, W/ REFLEX TO CULTURE (INFECTION SUSPECTED)
Bilirubin Urine: NEGATIVE
Glucose, UA: NEGATIVE mg/dL
Hgb urine dipstick: NEGATIVE
Ketones, ur: NEGATIVE mg/dL
Nitrite: NEGATIVE
Protein, ur: 30 mg/dL — AB
Specific Gravity, Urine: 1.014 (ref 1.005–1.030)
WBC, UA: >50 WBC/hpf (ref 0–5)
pH: 6 (ref 5.0–8.0)

## 2023-08-06 LAB — TROPONIN I (HIGH SENSITIVITY): Troponin I (High Sensitivity): 32 ng/L — ABNORMAL HIGH (ref <18)

## 2023-08-06 LAB — PROTIME-INR
INR: 1.1 (ref 0.8–1.2)
Prothrombin Time: 14 s (ref 11.4–15.2)

## 2023-08-06 LAB — BRAIN NATRIURETIC PEPTIDE: B Natriuretic Peptide: 84.5 pg/mL (ref 0.0–100.0)

## 2023-08-06 MED ORDER — CEPHALEXIN 500 MG PO CAPS: 500.0000 mg | ORAL_CAPSULE | Freq: Four times a day (QID) | ORAL | 0 refills | Status: DC

## 2023-08-06 MED ORDER — FUROSEMIDE 10 MG/ML IJ SOLN
40.0000 mg | Freq: Once | INTRAMUSCULAR | Status: AC
  Administered 2023-08-06: 40 mg via INTRAVENOUS
  Filled 2023-08-06: qty 4

## 2023-08-06 MED ORDER — SODIUM CHLORIDE 0.9 % IV SOLN
1.0000 g | Freq: Once | INTRAVENOUS | Status: AC
  Administered 2023-08-06: 1 g via INTRAVENOUS
  Filled 2023-08-06: qty 10

## 2023-08-06 NOTE — Patient Outreach (Signed)
  Care Coordination   Follow Up Visit Note   08/06/2023 Name: Jesus Lam MRN: [id number] DOB: 10/05/1933  Jesus Lam is a 89 y.o. year old male who sees [name], FNP for primary care. I spoke with  Jesus Lam by phone today.  What matters to the patients health and wellness today?  Patient is currently at [facility] ED for evaluation of impaired urinary elimination and swelling.     Goals Addressed             This Visit's Progress    RN Care Coordination Activities: further follow up needed   On track    Care Coordination Interventions: Placed outbound call to patient for f/u on symptoms of impaired urinary elimination and swelling  Determined patient is currently at [facility] for evaluation of his symptoms Sent secure message to [name] FNP notifying her of patient's ED visit     Interventions Today    Flowsheet Row Most Recent Value  Chronic Disease   Chronic disease during today's visit Other  [impaired urinary elimination,  edema]  General Interventions   General Interventions Discussed/Reviewed Communication with  Doctor Visits Discussed/Reviewed Doctor Visits Discussed, Doctor Visits Reviewed  [ED]  Communication with PCP/Specialists  [name] FNP]          SDOH assessments and interventions completed:  No     Care Coordination Interventions:  Yes, provided   Follow up plan: Follow up call scheduled for 08/08/23 @3 :30 PM     Encounter Outcome:  Patient Visit Completed

## 2023-08-06 NOTE — Progress Notes (Signed)
Left lower extremity venous duplex has been completed. Preliminary results can be found in CV Proc through chart review.  Results were given to Dr. Charm Barges.  08/06/23 12:02 PM Olen Cordial RVT

## 2023-08-06 NOTE — ED Provider Notes (Signed)
[facility] AT [facility] Provider Note   CSN: [id number] Arrival date & time: 08/06/23  1108     History  Chief Complaint  Patient presents with   Leg Swelling    Jesus Lam is a 89 y.o. male.  He was sent in by his doctor for 2 complaints.  #1 is he has burning with urination and difficulty urinating that is been going on over a month.  He said he had a course of antibiotics and that helped for a few days but his symptoms came back.  No fevers nausea vomiting.  No hematuria.  #2 his his legs are swollen and painful.  He said this has been going on for a few months.  He tells me he had a blood clot in his leg at 1 point.  He denies any chest pain or shortness of breath.  He has some chronic knee pain especially on the left side.  The history is provided by the patient.  Dysuria Presenting symptoms: dysuria   Context: after urination   Relieved by:  Nothing Worsened by:  Urination Ineffective treatments:  None tried Associated symptoms: urinary hesitation   Associated symptoms: no abdominal pain, no fever, no nausea and no vomiting        Home Medications Prior to Admission medications   Medication Sig Start Date End Date Taking? Authorizing Provider  acetaminophen (TYLENOL) 650 MG CR tablet TAKE ONE TABLET BY MOUTH TWICE A DAY 06/24/23   [name], FNP  amLODipine (NORVASC) 5 MG tablet Take 1 tablet (5 mg total) by mouth daily. 04/03/23 07/02/23  Patwardhan, Anabel Bene, MD  atorvastatin (LIPITOR) 40 MG tablet TAKE TWO TABLETS BY MOUTH DAILY 08/02/22   Patwardhan, Manish J, MD  azelastine (ASTELIN) 0.1 % nasal spray Place 2 sprays into both nostrils 2 (two) times daily. Use in each nostril as directed 08/15/21   [name], FNP  bisoprolol (ZEBETA) 5 MG tablet Take 0.5 tablets (2.5 mg total) by mouth daily. 07/11/23   [name], FNP  chlorthalidone (HYGROTON) 25 MG tablet Take 1 tablet (25 mg total) by mouth daily. 02/07/20   Dorothyann Peng,  MD  Cholecalciferol (VITAMIN D3) 5000 units CAPS Take 5,000 Units by mouth daily. 01/09/18   [provider]  clopidogrel (PLAVIX) 75 MG tablet TAKE ONE TABLET BY MOUTH DAILY 04/03/23   Patwardhan, Manish J, MD  diclofenac Sodium (VOLTAREN) 1 % GEL APPLY TWO GRAMS TOPICALLY FOUR TIMES A DAY 07/01/22   [name], FNP  donepezil (ARICEPT) 10 MG tablet TAKE 1 TABLET AT BEDTIME 04/03/23   [name], FNP  doxycycline (MONODOX) 100 MG capsule Take 1 capsule (100 mg total) by mouth 2 (two) times daily. 06/23/23   [name], FNP  Elastic Bandages & Supports (MEDICAL COMPRESSION STOCKINGS) MISC  06/06/18   [provider]  furosemide (LASIX) 20 MG tablet TAKE ONE TABLET BY MOUTH DAILY 06/24/23   [name], FNP  gabapentin (NEURONTIN) 300 MG capsule TAKE ONE CAPSULE BY MOUTH AT BEDTIME 01/11/21   [name], FNP  hydrALAZINE (APRESOLINE) 50 MG tablet Take 1 tablet (50 mg total) by mouth 3 (three) times daily. 04/03/23 07/02/23  Patwardhan, Anabel Bene, MD  icosapent Ethyl (VASCEPA) 1 g capsule TAKE ONE CAPSULE BY MOUTH TWICE A DAY 02/08/21   Patwardhan, Manish J, MD  isosorbide mononitrate (IMDUR) 30 MG 24 hr tablet TAKE ONE TABLET BY MOUTH TWICE A DAY 12/24/21   Patwardhan, Anabel Bene, MD  ketorolac Berkley Harvey)  0.5 % ophthalmic solution ketorolac 0.5 % eye drops    [provider]  levocetirizine (XYZAL) 5 MG tablet TAKE ONE TABLET BY MOUTH EVERY EVENING 10/22/21   [name], FNP  Menthol-Methyl Salicylate (MUSCLE RUB) 10-15 % CREA muscle rub    [provider]  methimazole (TAPAZOLE) 5 MG tablet Take 1 tablet (5 mg total) by mouth daily. 03/19/23   Shamleffer, Konrad Dolores, MD  Multiple Vitamins-Minerals (COMPLETE SENIOR PO) 1 tablet daily.  06/06/18   [provider]  MYRBETRIQ 25 MG TB24 tablet TAKE ONE TABLET BY MOUTH DAILY 10/08/22   [name], FNP  tamsulosin (FLOMAX) 0.4 MG CAPS capsule TAKE ONE CAPSULE BY MOUTH DAILY HALF HOUR FOLLOWING THE SAME MEAL  DAILY 11/13/21   [name], FNP  vitamin B-12 (CYANOCOBALAMIN) 500 MCG tablet Take 500 mcg by mouth daily.    [provider]  vitamin C (ASCORBIC ACID) 500 MG tablet Take 500 mg daily by mouth.    [provider]      Allergies    Shellfish allergy    Review of Systems   Review of Systems  Constitutional:  Negative for fever.  Respiratory:  Negative for shortness of breath.   Cardiovascular:  Positive for leg swelling. Negative for chest pain.  Gastrointestinal:  Negative for abdominal pain, nausea and vomiting.  Genitourinary:  Positive for dysuria and hesitancy.    Physical Exam Updated Vital Signs BP (!) 150/94 (BP Location: Left Arm)   Pulse 62   Temp 98.3 F (36.8 C) (Oral)   Resp 16   Ht 6' (1.829 m)   Wt 108.9 kg   SpO2 96%   BMI 32.55 kg/m  Physical Exam Vitals and nursing note reviewed.  Constitutional:      General: He is not in acute distress.    Appearance: Normal appearance. He is well-developed.  HENT:     Head: Normocephalic and atraumatic.  Eyes:     Conjunctiva/sclera: Conjunctivae normal.  Cardiovascular:     Rate and Rhythm: Normal rate and regular rhythm.     Heart sounds: No murmur heard. Pulmonary:     Effort: Pulmonary effort is normal. No respiratory distress.     Breath sounds: Normal breath sounds.  Abdominal:     Palpations: Abdomen is soft.     Tenderness: There is no abdominal tenderness. There is no guarding or rebound.  Musculoskeletal:        General: Tenderness present.     Cervical back: Neck supple.     Right lower leg: Edema present.     Left lower leg: Edema present.  Skin:    General: Skin is warm and dry.     Capillary Refill: Capillary refill takes less than 2 seconds.  Neurological:     General: No focal deficit present.     Mental Status: He is alert.     Comments: He has a little bit of stuttering speech but is able to be understood.     ED Results / Procedures / Treatments   Labs (all  labs ordered are listed, but only abnormal results are displayed) Labs Reviewed  COMPREHENSIVE METABOLIC PANEL - Abnormal; Notable for the following components:      Result Value   Glucose, Bld 102 (*)    Albumin 3.4 (*)    All other components within normal limits  CBC WITH DIFFERENTIAL/PLATELET - Abnormal; Notable for the following components:   RBC 3.78 (*)    Hemoglobin 11.0 (*)  HCT 36.6 (*)    All other components within normal limits  URINALYSIS, W/ REFLEX TO CULTURE (INFECTION SUSPECTED) - Abnormal; Notable for the following components:   APPearance CLOUDY (*)    Protein, ur 30 (*)    Leukocytes,Ua LARGE (*)    Bacteria, UA RARE (*)    Non Squamous Epithelial 0-5 (*)    All other components within normal limits  TROPONIN I (HIGH SENSITIVITY) - Abnormal; Notable for the following components:   Troponin I (High Sensitivity) 32 (*)    All other components within normal limits  URINE CULTURE  PROTIME-INR  BRAIN NATRIURETIC PEPTIDE  TROPONIN I (HIGH SENSITIVITY)    EKG EKG Interpretation Date/Time:  Wednesday August 06 2023 12:04:40 EDT Ventricular Rate:  51 PR Interval:  312 QRS Duration:  138 QT Interval:  471 QTC Calculation: 434 R Axis:   -50  Text Interpretation: Sinus or ectopic atrial rhythm Prolonged PR interval Nonspecific IVCD with LAD Left ventricular hypertrophy Nonspecific T abnormalities, inferior leads COPY Confirmed by Meridee Score [phone]) on 08/06/2023 12:22:12 PM  Radiology DG Chest Port 1 View  Result Date: 08/06/2023 CLINICAL DATA:  Weakness. EXAM: PORTABLE CHEST 1 VIEW COMPARISON:  Radiographs 07/28/2022 and 06/02/2018. FINDINGS: 1138 hours. The heart size and mediastinal contours are stable with mild aortic tortuosity. Lower lung volumes with mildly increased streaky opacities at both lung bases. No confluent airspace disease, edema, pneumothorax or significant pleural effusion identified. The bones appear unremarkable. IMPRESSION: Lower lung  volumes with mildly increased streaky opacities at both lung bases, likely atelectasis. If concern of pneumonia or aspiration, radiographic follow up recommended. Electronically Signed   By: Carey Bullocks M.D.   On: 08/06/2023 12:33   VAS [place] LOWER EXTREMITY VENOUS (DVT) (7a-7p)  Result Date: 08/06/2023  Lower Venous DVT Study Patient Name:  JEVAN BERTELSEN  Date of Exam:   08/06/2023 Medical Rec #: [id number]        Accession #:    [id number] Date of Birth: [birth year]         Patient Gender: M Patient Age:   56 years Exam Location:  [facility] Procedure:      VAS [place] LOWER EXTREMITY VENOUS (DVT) Referring Phys: Lindsi Bayliss --------------------------------------------------------------------------------  Indications: Edema.  Risk Factors: None identified. Limitations: Poor ultrasound/tissue interface. Comparison Study: No prior studies. Performing Technologist: Chanda Busing RVT  Examination Guidelines: A complete evaluation includes B-mode imaging, spectral Doppler, color Doppler, and power Doppler as needed of all accessible portions of each vessel. Bilateral testing is considered an integral part of a complete examination. Limited examinations for reoccurring indications may be performed as noted. The reflux portion of the exam is performed with the patient in reverse Trendelenburg.  +-----+---------------+---------+-----------+----------+--------------+ RIGHTCompressibilityPhasicitySpontaneityPropertiesThrombus Aging +-----+---------------+---------+-----------+----------+--------------+ CFV  Full           Yes      Yes                                 +-----+---------------+---------+-----------+----------+--------------+   +---------+---------------+---------+-----------+----------+--------------+ LEFT     CompressibilityPhasicitySpontaneityPropertiesThrombus Aging +---------+---------------+---------+-----------+----------+--------------+ CFV      Full           Yes       Yes                                 +---------+---------------+---------+-----------+----------+--------------+ SFJ      Full                                                        +---------+---------------+---------+-----------+----------+--------------+  FV Prox  Full                                                        +---------+---------------+---------+-----------+----------+--------------+ FV Mid   Full           Yes      Yes                                 +---------+---------------+---------+-----------+----------+--------------+ FV Distal               Yes      Yes                                 +---------+---------------+---------+-----------+----------+--------------+ PFV      Full                                                        +---------+---------------+---------+-----------+----------+--------------+ POP      Full           Yes      Yes                                 +---------+---------------+---------+-----------+----------+--------------+ PTV      Full                                                        +---------+---------------+---------+-----------+----------+--------------+ PERO     Full                                                        +---------+---------------+---------+-----------+----------+--------------+    Summary: RIGHT: - No evidence of common femoral vein obstruction.   LEFT: - There is no evidence of deep vein thrombosis in the lower extremity.  - No cystic structure found in the popliteal fossa.  *See table(s) above for measurements and observations.    Preliminary     Procedures Procedures    Medications Ordered in ED Medications  furosemide (LASIX) injection 40 mg (40 mg Intravenous Given 08/06/23 1343)  cefTRIAXone (ROCEPHIN) 1 g in sodium chloride 0.9 % 100 mL IVPB (0 g Intravenous Stopped 08/06/23 1517)    ED Course/ Medical Decision Making/ A&P Clinical Course as of 08/06/23 1756  Wed  Aug 06, 2023  1159 Left lower extremities negative for DVT on a preliminary read. [MB]  1200 Chest x-ray interpreted by me as no acute infiltrate.  Awaiting radiology reading [MB]  1222 EKG not crossing in epic.  Sinus rhythm with first-degree block nonspecific idioventricular, nonspecific T waves unchanged from prior 9/23 [MB]    Clinical Course User Index [MB] Terrilee Files, MD  Medical Decision Making Amount and/or Complexity of Data Reviewed Labs: ordered. Radiology: ordered.  Risk Prescription drug management.   This patient complains of swelling of his legs left greater than right, dysuria; this involves an extensive number of treatment Options and is a complaint that carries with it a high risk of complications and morbidity. The differential includes UTI, renal failure, DVT, peripheral edema, cellulitis  I ordered, reviewed and interpreted labs, which included CBC I ordered medication IV Lasix and reviewed PMP when indicated. I ordered imaging studies which included chest x-ray and left lower extremity duplex and I independently    visualized and interpreted imaging which showed low volumes atelectasis cannot exclude infiltrate, duplex without DVT Additional history obtained from patient's family member Previous records obtained and reviewed in epic including recent PCP notes Cardiac monitoring reviewed, sinus bradycardia Social determinants considered, no significant barriers Critical Interventions: None  After the interventions stated above, I reevaluated the patient and found patient to be satting well on room air in no distress Admission and further testing considered, no indications for admission at this time.  Given IV dose of diuretic and will cover with antibiotics for possible UTI.  Urine sent for culture.  Patient needs close follow-up with PCP for possible increase in his diuretics.  Patient counseled to low salt and increase  compression, increase elevation.  Return instructions discussed         Final Clinical Impression(s) / ED Diagnoses Final diagnoses:  Lower urinary tract infection, acute  Peripheral edema    Rx / DC Orders ED Discharge Orders          Ordered    cephALEXin (KEFLEX) 500 MG capsule  4 times daily        08/06/23 1407              Terrilee Files, MD 08/06/23 1759

## 2023-08-06 NOTE — Discharge Instructions (Signed)
You were seen in the emergency department for worsening leg swelling and pain and urinary symptoms.  Your urine showed signs of infection and you were given an IV dose of antibiotics and a prescription was sent for you to continue for a week.  You had an ultrasound of your leg that did not show any evidence of blood clot.  Please contact your primary care doctor as you may need to increase your diuretic.  Please watch your salt intake and try to elevate your legs when able.

## 2023-08-06 NOTE — Patient Instructions (Signed)
Visit Information  Thank you for taking time to visit with me today. Please don't hesitate to contact me if I can be of assistance to you.   Following are the goals we discussed today:   Goals Addressed             This Visit's Progress    RN Care Coordination Activities: further follow up needed   On track    Care Coordination Interventions: Placed outbound call to patient for f/u on symptoms of impaired urinary elimination and swelling  Determined patient is currently at [facility] Emergency Department for evaluation of his symptoms Sent secure message to [name] FNP notifying her of patient's ED visit         Our next appointment is by telephone on 08/08/23 at 3:30 PM  Please call the care guide team at [phone] if you need to cancel or reschedule your appointment.   If you are experiencing a Mental Health or Behavioral Health Crisis or need someone to talk to, please call 1-800-273-TALK (toll free, 24 hour hotline)  The patient verbalized understanding of instructions, educational materials, and care plan provided today and DECLINED offer to receive copy of patient instructions, educational materials, and care plan.   [name] RN BSN CCM [place]  [facility], [facility] Health Nurse Care Coordinator  Direct Dial: [phone] Website: [email or web address]

## 2023-08-06 NOTE — ED Notes (Signed)
Pt aware urine sample is needed 

## 2023-08-06 NOTE — ED Triage Notes (Signed)
Pt referred to the ER by his PCP due to bilateral swelling in both legs going on for about 1 month. Left leg more swollen that right, denies any redness. Pt is also endorsing burning with urination.

## 2023-08-08 ENCOUNTER — Ambulatory Visit: Payer: Self-pay

## 2023-08-08 NOTE — Patient Outreach (Signed)
Care Coordination   Follow Up Visit Note   08/08/2023 Name: Jesus Lam MRN: [id number] DOB: 05/12/1933  Jesus Lam is a 89 y.o. year old male who sees [name], FNP for primary care. I spoke with  Jesus Lam by phone today.  What matters to the patients health and wellness today?  Patient would like to make a full recovery from his UTI. He would like to have his worsening edema further evaluated by his PCP.     Goals Addressed             This Visit's Progress    COMPLETED: RN Care Coordination Activities: further follow up needed       Care Coordination Interventions: See other goals     To have lower extremity edema and sleepiness evaluated by PCP   On track    Care Coordination Interventions: Determined patient completed an ED visit for evaluation of edema and impaired urinary elimination  Review of patient status, including review of consultant's reports, relevant laboratory and other test results, and medications completed Educated patient on the importance of completing his full course of antibiotics, educated on importance of staying well hydrated with water  Assisted patient with schedule his post ED follow up with [facility] Sent an in basket message to PCP provider [name] FNP requesting a post ED appointment be scheduled with patient for further evaluation of his worsening edema     To schedule a folllow up with [facility] for evaluation of elevated PSA   On track    Care Coordination Interventions: Evaluation of current treatment plan related to elevated PSA with symptoms of impaired urinary elimination and patient's adherence to plan as established by provider Assisted patient with scheduling his post ED follow up visit with [facility] Reviewed and discussed patient's upcoming scheduled appointment for 08/14/23 @11 :00 AM, confirmed patient will be able to arrange transportation in order to keep this appointment      Interventions Today    Flowsheet Row Most Recent Value  Chronic Disease   Chronic disease during today's visit Other  [UTI,  edema]  General Interventions   General Interventions Discussed/Reviewed General Interventions Discussed, General Interventions Reviewed, Labs, Doctor Visits, Communication with  Doctor Visits Discussed/Reviewed Doctor Visits Discussed, Doctor Visits Reviewed, Specialist, PCP  Communication with PCP/Specialists  [name] FNP,  [facility]]  Education Interventions   Education Provided Provided Education  Provided Verbal Education On Nutrition, Labs, Medication, When to see the doctor  Nutrition Interventions   Nutrition Discussed/Reviewed Nutrition Discussed, Nutrition Reviewed, Fluid intake  Pharmacy Interventions   Pharmacy Dicussed/Reviewed Pharmacy Topics Discussed, Pharmacy Topics Reviewed, Medications and their functions          SDOH assessments and interventions completed:  No     Care Coordination Interventions:  Yes, provided   Follow up plan: Follow up call scheduled for 08/25/23 @1 :30 PM     Encounter Outcome:  Patient Visit Completed

## 2023-08-08 NOTE — Patient Instructions (Signed)
Visit Information  Thank you for taking time to visit with me today. Please don't hesitate to contact me if I can be of assistance to you.   Following are the goals we discussed today:   Goals Addressed             This Visit's Progress    COMPLETED: RN Care Coordination Activities: further follow up needed       Care Coordination Interventions: See other goals     To have lower extremity edema and sleepiness evaluated by PCP   On track    Care Coordination Interventions: Determined patient completed an ED visit for evaluation of edema and impaired urinary elimination  Review of patient status, including review of consultant's reports, relevant laboratory and other test results, and medications completed Educated patient on the importance of completing his full course of antibiotics, educated on importance of staying well hydrated with water  Assisted patient with schedule his post ED follow up with [facility] Sent an in basket message to PCP provider [name] FNP requesting a post ED appointment be scheduled with patient for further evaluation of his worsening edema     To schedule a folllow up with [facility] for evaluation of elevated PSA   On track    Care Coordination Interventions: Evaluation of current treatment plan related to elevated PSA with symptoms of impaired urinary elimination and patient's adherence to plan as established by provider Assisted patient with scheduling his post ED follow up visit with [facility] Reviewed and discussed patient's upcoming scheduled appointment for 08/14/23 @11 :00 AM, confirmed patient will be able to arrange transportation in order to keep this appointment         Our next appointment is by telephone on 08/25/23 at 1:30 PM  Please call the care guide team at [phone] if you need to cancel or reschedule your appointment.   If you are experiencing a Mental Health or Behavioral Health Crisis or need someone to  talk to, please call 1-800-273-TALK (toll free, 24 hour hotline)  The patient verbalized understanding of instructions, educational materials, and care plan provided today and DECLINED offer to receive copy of patient instructions, educational materials, and care plan.   [name] RN BSN CCM [place]  [facility] Health Nurse Care Coordinator  Direct Dial: [phone] Website: [email or web address]

## 2023-08-09 LAB — URINE CULTURE: Culture: 40000 — AB

## 2023-08-10 ENCOUNTER — Telehealth (HOSPITAL_BASED_OUTPATIENT_CLINIC_OR_DEPARTMENT_OTHER): Payer: Self-pay | Admitting: *Deleted

## 2023-08-10 NOTE — Progress Notes (Signed)
ED Antimicrobial Stewardship Positive Culture Follow Up   Jesus Lam is an 89 y.o. male who presented to [facility] on 08/06/2023 with a chief complaint of  Chief Complaint  Patient presents with   Leg Swelling    Recent Results (from the past 720 hour(s))  Urine Culture     Status: Abnormal   Collection Time: 08/06/23  1:16 PM   Specimen: Urine, Random  Result Value Ref Range Status   Specimen Description   Final    URINE, RANDOM Performed at [facility], [address] [place], [place] [address]    Special Requests   Final    NONE Reflexed from [id number] Performed at [facility], [address], [place], [place] [address]    Culture 40,000 COLONIES/mL ENTEROCOCCUS FAECALIS (A)  Final   Report Status 08/09/2023 FINAL  Final   Organism ID, Bacteria ENTEROCOCCUS FAECALIS (A)  Final      Susceptibility   Enterococcus faecalis - MIC*    AMPICILLIN <=2 SENSITIVE Sensitive     NITROFURANTOIN <=16 SENSITIVE Sensitive     VANCOMYCIN 1 SENSITIVE Sensitive     * 40,000 COLONIES/mL ENTEROCOCCUS FAECALIS    [x]  Treated with Cephalexin, organism resistant to prescribed antimicrobial []  Patient discharged originally without antimicrobial agent and treatment is now indicated  New antibiotic prescription: Ampicillin 500mg  PO 4 times per day for 7 days.   ED Provider: Melton Alar PA  Lynann Beaver PharmD, BCPS [facility] main pharmacy [phone] 08/10/2023 11:44 AM

## 2023-08-10 NOTE — Telephone Encounter (Signed)
Post ED Visit - Positive Culture Follow-up: Successful Patient Follow-Up  Culture assessed and recommendations reviewed by:  []  Enzo Bi, Pharm.D. []  Celedonio Miyamoto, Pharm.D., BCPS AQ-ID []  Garvin Fila, Pharm.D., BCPS []  Georgina Pillion, [address].D., BCPS []  [place], [address].D., BCPS, AAHIVP []  Estella Husk, Pharm.D., BCPS, AAHIVP []  Lysle Pearl, PharmD, BCPS []  Phillips Climes, PharmD, BCPS []  Agapito Games, PharmD, BCPS [x] Lynann Beaver, PharmD  Positive urine culture  []  Patient discharged without antimicrobial prescription and treatment is now indicated [x]  Organism is resistant to prescribed ED discharge antimicrobial []  Patient with positive blood cultures  Spoke with patient. Pt requests prescription be called to [facility] in [place].This pharmacy is closed today. Pt does not want to use a different pharmacy. Will call prescription in in am once open.  Contacted patient, date 07/1523, time 1211   Patsey Berthold 08/10/2023, 12:10 PM

## 2023-08-11 ENCOUNTER — Other Ambulatory Visit: Payer: Self-pay | Admitting: Cardiology

## 2023-08-12 ENCOUNTER — Telehealth: Payer: Self-pay

## 2023-08-12 NOTE — Transitions of Care (Post Inpatient/ED Visit) (Signed)
08/12/2023  Name: Jesus Lam MRN: [id number] DOB: 31-Aug-1933  Today's TOC FU Call Status: Today's TOC FU Call Status:: Successful TOC FU Call Completed TOC FU Call Complete Date: 08/12/23 Patient's Name and Date of Birth confirmed.  Transition Care Management Follow-up Telephone Call Date of Discharge: 08/06/23 Discharge Facility: [facility]) Type of Discharge: Emergency Department Reason for ED Visit: Other: ("lower urinary tract infection,acute") How have you been since you were released from the hospital?: Better (Pt states he is doing  'a little better-urine is much clearer but still having some burning at times.' He was started on new abx due to culture results-taking without issues. Drinking water. States appetite is coming back.) Any questions or concerns?: No  Items Reviewed: Did you receive and understand the discharge instructions provided?: Yes Medications obtained,verified, and reconciled?: No Medications Not Reviewed Reasons:: Other: (pt not at home at present-doesn't know meds-states his abx was changed yest but unable to recall name) Any new allergies since your discharge?: No Dietary orders reviewed?: Yes Type of Diet Ordered:: low salt/heart healthy Do you have support at home?: Yes People in Home: alone  Medications Reviewed Today: Medications Reviewed Today     Reviewed by Charlyn Minerva, RN (Registered Nurse) on 08/12/23 at 1040  Med List Status: <None>   Medication Order Taking? Sig Documenting Provider Last Dose Status Informant  0.9 %  sodium chloride infusion [id number]   Charlesetta Ivory, NP  Active   acetaminophen (TYLENOL) 650 MG CR tablet [id number]  TAKE ONE TABLET BY MOUTH TWICE A Vedia Pereyra, FNP  Active   amLODipine (NORVASC) 5 MG tablet [id number]  Take 1 tablet (5 mg total) by mouth daily. Elder Negus, MD  Expired 07/02/23 2359   atorvastatin (LIPITOR) 40 MG tablet [id number]  TAKE TWO TABLETS BY MOUTH DAILY  Patwardhan, Manish J, MD  Active   azelastine (ASTELIN) 0.1 % nasal spray [id number]  Place 2 sprays into both nostrils 2 (two) times daily. Use in each nostril as directed [name], FNP  Active   bisoprolol (ZEBETA) 5 MG tablet [id number]  Take 0.5 tablets (2.5 mg total) by mouth daily. [name], FNP  Active   cephALEXin (KEFLEX) 500 MG capsule [id number]  Take 1 capsule (500 mg total) by mouth 4 (four) times daily. Terrilee Files, MD  Active   chlorthalidone (HYGROTON) 25 MG tablet [id number]  Take 1 tablet (25 mg total) by mouth daily. Dorothyann Peng, MD  Active   Cholecalciferol (VITAMIN D3) 5000 units CAPS [id number]  Take 5,000 Units by mouth daily. [provider]  Active   clopidogrel (PLAVIX) 75 MG tablet [id number]  TAKE ONE TABLET BY MOUTH DAILY Patwardhan, Manish J, MD  Active   diclofenac Sodium (VOLTAREN) 1 % GEL [id number]  APPLY TWO GRAMS TOPICALLY FOUR TIMES A Vedia Pereyra, FNP  Active   donepezil (ARICEPT) 10 MG tablet [id number]  TAKE 1 TABLET AT BEDTIME [name], FNP  Active   doxycycline (MONODOX) 100 MG capsule [id number]  Take 1 capsule (100 mg total) by mouth 2 (two) times daily. [name], FNP  Active   Elastic Bandages & Supports (MEDICAL COMPRESSION STOCKINGS) MISC [id number]   [provider]  Active   furosemide (LASIX) 20 MG tablet [id number]  TAKE ONE TABLET BY MOUTH DAILY [name], FNP  Active   gabapentin (NEURONTIN) 300 MG capsule [id number]  TAKE ONE CAPSULE BY MOUTH AT BEDTIME [name], FNP  Active   hydrALAZINE (APRESOLINE) 50 MG tablet  [id number]  Take 1 tablet (50 mg total) by mouth 3 (three) times daily. Elder Negus, MD  Expired 07/02/23 2359   icosapent Ethyl (VASCEPA) 1 g capsule [id number]  TAKE ONE CAPSULE BY MOUTH TWICE A DAY Patwardhan, Manish J, MD  Active   isosorbide mononitrate (IMDUR) 30 MG 24 hr tablet [id number]  TAKE ONE TABLET BY MOUTH TWICE A DAY Patwardhan, Manish J, MD  Active   ketorolac  (ACULAR) 0.5 % ophthalmic solution [id number]  ketorolac 0.5 % eye drops [provider]  Active   levocetirizine (XYZAL) 5 MG tablet [id number]  TAKE ONE TABLET BY MOUTH EVERY Juanell Fairly, FNP  Active   Menthol-Methyl Salicylate (MUSCLE RUB) 10-15 % CREA [id number]  muscle rub [provider]  Active   methimazole (TAPAZOLE) 5 MG tablet [id number]  Take 1 tablet (5 mg total) by mouth daily. Shamleffer, Konrad Dolores, MD  Active   Multiple Vitamins-Minerals (COMPLETE SENIOR PO) [id number]  1 tablet daily.  [provider]  Active   MYRBETRIQ 25 MG TB24 tablet [id number]  TAKE ONE TABLET BY MOUTH DAILY [name], FNP  Active   tamsulosin (FLOMAX) 0.4 MG CAPS capsule [id number]  TAKE ONE CAPSULE BY MOUTH DAILY HALF HOUR FOLLOWING THE SAME MEAL DAILY [name], FNP  Active   vitamin B-12 (CYANOCOBALAMIN) 500 MCG tablet [id number]  Take 500 mcg by mouth daily. [provider]  Active   vitamin C (ASCORBIC ACID) 500 MG tablet [id number]  Take 500 mg daily by mouth. [provider]  Active             Home Care and Equipment/Supplies: Were Home Health Services Ordered?: NA Any new equipment or medical supplies ordered?: NA  Functional Questionnaire: Do you need assistance with bathing/showering or dressing?: No Do you need assistance with meal preparation?: No Do you need assistance with eating?: No Do you have difficulty maintaining continence: No Do you need assistance with getting out of bed/getting out of a chair/moving?: No Do you have difficulty managing or taking your medications?: No  Follow up appointments reviewed: PCP Follow-up appointment confirmed?: Yes Date of PCP follow-up appointment?: 08/21/23 Follow-up Provider: Ellender Hose [facility] Follow-up appointment confirmed?: NA Do you need transportation to your follow-up appointment?: No (pt confirms he has someone to take him to appts) Do you understand care  options if your condition(s) worsen?: Yes-patient verbalized understanding  SDOH Interventions Today    Flowsheet Row Most Recent Value  SDOH Interventions   Food Insecurity Interventions Intervention Not Indicated  Transportation Interventions Intervention Not Indicated      TOC Interventions Today    Flowsheet Row Most Recent Value  TOC Interventions   TOC Interventions Discussed/Reviewed TOC Interventions Discussed, Arranged PCP follow up less than 12 days/Care Guide scheduled      Interventions Today    Flowsheet Row Most Recent Value  General Interventions   General Interventions Discussed/Reviewed General Interventions Discussed, Doctor Visits, Durable Medical Equipment (DME)  Doctor Visits Discussed/Reviewed Doctor Visits Discussed, PCP  Durable Medical Equipment (DME) BP Cuff  PCP/Specialist Visits Compliance with follow-up visit  Education Interventions   Education Provided Provided Education  Provided Verbal Education On Nutrition, When to see the doctor, Medication, Other  [sx mgmt]  Nutrition Interventions   Nutrition Discussed/Reviewed Nutrition Discussed, Adding fruits and vegetables, Fluid intake, Decreasing fats, Increasing proteins, Decreasing salt  Pharmacy Interventions   Pharmacy Dicussed/Reviewed Pharmacy Topics Discussed, Medications and their functions       Antionette Fairy, RN,BSN,CCM  [facility] Care Management Care Management Community Coordinator Direct Phone: [phone] Toll Free: [phone] Fax: [phone]

## 2023-08-14 DIAGNOSIS — R3912 Poor urinary stream: Secondary | ICD-10-CM | POA: Diagnosis not present

## 2023-08-14 DIAGNOSIS — N401 Enlarged prostate with lower urinary tract symptoms: Secondary | ICD-10-CM | POA: Diagnosis not present

## 2023-08-14 DIAGNOSIS — R972 Elevated prostate specific antigen [PSA]: Secondary | ICD-10-CM | POA: Diagnosis not present

## 2023-08-21 ENCOUNTER — Ambulatory Visit: Payer: Medicare HMO | Admitting: Family Medicine

## 2023-08-21 ENCOUNTER — Encounter: Payer: Self-pay | Admitting: Family Medicine

## 2023-08-21 VITALS — BP 120/74 | HR 60 | Temp 98.7°F | Ht 72.0 in | Wt 237.4 lb

## 2023-08-21 DIAGNOSIS — I739 Peripheral vascular disease, unspecified: Secondary | ICD-10-CM | POA: Diagnosis not present

## 2023-08-21 DIAGNOSIS — Z6832 Body mass index (BMI) 32.0-32.9, adult: Secondary | ICD-10-CM

## 2023-08-21 DIAGNOSIS — H903 Sensorineural hearing loss, bilateral: Secondary | ICD-10-CM

## 2023-08-21 DIAGNOSIS — N1831 Chronic kidney disease, stage 3a: Secondary | ICD-10-CM

## 2023-08-21 DIAGNOSIS — M7989 Other specified soft tissue disorders: Secondary | ICD-10-CM

## 2023-08-21 DIAGNOSIS — E6609 Other obesity due to excess calories: Secondary | ICD-10-CM

## 2023-08-21 DIAGNOSIS — N183 Chronic kidney disease, stage 3 unspecified: Secondary | ICD-10-CM

## 2023-08-21 DIAGNOSIS — I131 Hypertensive heart and chronic kidney 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 failure, with stage 1 through stage 4 chronic kidney disease, or unspecified chronic kidney disease: Secondary | ICD-10-CM | POA: Diagnosis not present

## 2023-08-21 NOTE — Progress Notes (Signed)
I,Victoria T Deloria Lair, CMA,acting as a Neurosurgeon for [employer], NP.,have documented all relevant documentation on the behalf of Ellender Hose, NP,as directed by  Ellender Hose, NP while in the presence of Ellender Hose, NP.  Subjective:  Patient ID: Jesus Lam , male    DOB: 1933/05/16 , 89 y.o.   MRN: [id number]  Chief Complaint  Patient presents with   Hospitalization Follow-up    HPI  Patient presents today for hospital follow up. Patient was treated at [facility] ER on 08/06/23 for lower extremity swelling , dysuria and urgency. Patient states his symptoms are much better.Patient has had a diagnosis of PAD , reports he has had DVT in the past. Patient was advised to use his TED hose stocking regularly. Patient voiced understanding, he states that he went to see the Urologist last week and has another appointment on Monday 08/25/2023.  Patient states he lost his hearing aids one year ago, needs another.     Past Medical History:  Diagnosis Date   Anemia    BPH (benign prostatic hyperplasia)    Cataracts, bilateral    CVA (cerebral vascular accident) (HCC)    DVT (deep venous thrombosis) (HCC)    dvt in left leg   Dyslipidemia    Gout    Gout    HTN (hypertension)    Left hip pain 03/15/2020   Left leg pain    Osteoarthritis    PAD (peripheral artery disease) (HCC)    Stasis dermatitis    Stroke (HCC)    Vitamin D deficiency      Family History  Problem Relation Age of Onset   Cancer Mother    Cancer Father    Alcohol abuse Father    Breast cancer Daughter    Stroke Daughter    CVA Daughter      Current Outpatient Medications:    acetaminophen (TYLENOL) 650 MG CR tablet, TAKE ONE TABLET BY MOUTH TWICE A DAY, Disp: 60 tablet, Rfl: 4   atorvastatin (LIPITOR) 40 MG tablet, TAKE TWO TABLETS BY MOUTH DAILY, Disp: 180 tablet, Rfl: 10   azelastine (ASTELIN) 0.1 % nasal spray, Place 2 sprays into both nostrils 2 (two) times daily. Use in each nostril as directed, Disp: 30 mL,  Rfl: 5   bisoprolol (ZEBETA) 5 MG tablet, Take 0.5 tablets (2.5 mg total) by mouth daily., Disp: 60 tablet, Rfl: 2   cephALEXin (KEFLEX) 500 MG capsule, Take 1 capsule (500 mg total) by mouth 4 (four) times daily., Disp: 28 capsule, Rfl: 0   chlorthalidone (HYGROTON) 25 MG tablet, Take 1 tablet (25 mg total) by mouth daily., Disp: 5 tablet, Rfl: 0   Cholecalciferol (VITAMIN D3) 5000 units CAPS, Take 5,000 Units by mouth daily., Disp: , Rfl:    clopidogrel (PLAVIX) 75 MG tablet, TAKE ONE TABLET BY MOUTH DAILY, Disp: 90 tablet, Rfl: 0   diclofenac Sodium (VOLTAREN) 1 % GEL, APPLY TWO GRAMS TOPICALLY FOUR TIMES A DAY, Disp: 100 g, Rfl: 1   donepezil (ARICEPT) 10 MG tablet, TAKE 1 TABLET AT BEDTIME, Disp: 90 tablet, Rfl: 3   doxycycline (MONODOX) 100 MG capsule, Take 1 capsule (100 mg total) by mouth 2 (two) times daily., Disp: 14 capsule, Rfl: 0   Elastic Bandages & Supports (MEDICAL COMPRESSION STOCKINGS) MISC, , Disp: , Rfl:    furosemide (LASIX) 20 MG tablet, TAKE ONE TABLET BY MOUTH DAILY, Disp: 90 tablet, Rfl: 4   gabapentin (NEURONTIN) 300 MG capsule, TAKE ONE CAPSULE BY MOUTH AT BEDTIME,  Disp: 90 capsule, Rfl: 0   isosorbide mononitrate (IMDUR) 30 MG 24 hr tablet, TAKE ONE TABLET BY MOUTH TWICE A DAY, Disp: 60 tablet, Rfl: 0   ketorolac (ACULAR) 0.5 % ophthalmic solution, ketorolac 0.5 % eye drops, Disp: , Rfl:    levocetirizine (XYZAL) 5 MG tablet, TAKE ONE TABLET BY MOUTH EVERY EVENING, Disp: 90 tablet, Rfl: 4   Menthol-Methyl Salicylate (MUSCLE RUB) 10-15 % CREA, muscle rub, Disp: , Rfl:    methimazole (TAPAZOLE) 5 MG tablet, Take 1 tablet (5 mg total) by mouth daily., Disp: 30 tablet, Rfl: 0   Multiple Vitamins-Minerals (COMPLETE SENIOR PO), 1 tablet daily. , Disp: , Rfl:    MYRBETRIQ 25 MG TB24 tablet, TAKE ONE TABLET BY MOUTH DAILY, Disp: 30 tablet, Rfl: 0   tamsulosin (FLOMAX) 0.4 MG CAPS capsule, TAKE ONE CAPSULE BY MOUTH DAILY HALF HOUR FOLLOWING THE SAME MEAL DAILY, Disp: 90 capsule,  Rfl: 4   vitamin B-12 (CYANOCOBALAMIN) 500 MCG tablet, Take 500 mcg by mouth daily., Disp: , Rfl:    vitamin C (ASCORBIC ACID) 500 MG tablet, Take 500 mg daily by mouth., Disp: , Rfl:    amLODipine (NORVASC) 5 MG tablet, Take 1 tablet (5 mg total) by mouth daily., Disp: 180 tablet, Rfl: 3   hydrALAZINE (APRESOLINE) 50 MG tablet, Take 1 tablet (50 mg total) by mouth 3 (three) times daily., Disp: 270 tablet, Rfl: 3   icosapent Ethyl (VASCEPA) 1 g capsule, TAKE ONE CAPSULE BY MOUTH TWICE A DAY, Disp: 60 capsule, Rfl: 0  Current Facility-Administered Medications:    0.9 %  sodium chloride infusion, , Intravenous, PRN, Ghumman, Ramandeep, NP   Allergies  Allergen Reactions   Shellfish Allergy Other (See Comments)    Gout      Review of Systems  Constitutional: Negative.   HENT: Negative.         Hearing loss  Respiratory: Negative.    Cardiovascular:  Positive for leg swelling.  Musculoskeletal:  Positive for gait problem.       Uses a cane d/t left knee pain  Skin: Negative.   Allergic/Immunologic: Negative.   Neurological:  Positive for speech difficulty.       Stutters a bit  Hematological: Negative.      Today's Vitals   08/21/23 1413  BP: 120/74  Pulse: 60  Temp: 98.7 F (37.1 C)  SpO2: 98%  Weight: 237 lb 6.4 oz (107.7 kg)  Height: 6' (1.829 m)   Body mass index is 32.2 kg/m.  Wt Readings from Last 3 Encounters:  08/21/23 237 lb 6.4 oz (107.7 kg)  08/06/23 240 lb (108.9 kg)  06/04/23 239 lb 6.4 oz (108.6 kg)     Objective:  Physical Exam Constitutional:      Appearance: Normal appearance.  HENT:     Head: Normocephalic.  Cardiovascular:     Rate and Rhythm: Normal rate and regular rhythm.     Pulses: Normal pulses.     Heart sounds: Normal heart sounds.  Pulmonary:     Effort: Pulmonary effort is normal.     Breath sounds: Normal breath sounds.  Abdominal:     General: Bowel sounds are normal.  Musculoskeletal:        General: Swelling present.   Skin:    General: Skin is warm and dry.  Neurological:     Mental Status: He is alert.  Psychiatric:        Mood and Affect: Mood normal.  Behavior: Behavior normal.         Assessment And Plan:  Leg swelling Assessment & Plan: Visited ER 08/06/2023   Stage 3 chronic kidney disease, unspecified whether stage 3a or 3b CKD (HCC) Assessment & Plan: Encouraged to keep BP well controlled and avoid use of NSAIDs    Peripheral artery disease (HCC) Assessment & Plan: Advised to use TED hose daily .   Sensorineural hearing loss (SNHL) of both ears Assessment & Plan: Uses hearing aids; but states he has lost it.   Class 1 obesity due to excess calories with serious comorbidity and body mass index (BMI) of 32.0 to 32.9 in adult  She is encouraged to strive for BMI less than 30 to decrease cardiac risk. Advised to aim for at least 150 minutes of exercise per week.    I, Ellender Hose, NP, have reviewed all documentation for this visit. The documentation on 08/23/23 for the exam, diagnosis, procedures, and orders are all accurate and complete.    Return if symptoms worsen or fail to improve.  Patient was given opportunity to ask questions. Patient verbalized understanding of the plan and was able to repeat key elements of the plan. All questions were answered to their satisfaction.  Ellender Hose, NP    IF YOU HAVE BEEN REFERRED TO A SPECIALIST, IT MAY TAKE 1-2 WEEKS TO SCHEDULE/PROCESS THE REFERRAL. IF YOU HAVE NOT HEARD FROM US/SPECIALIST IN TWO WEEKS, PLEASE GIVE [place] A CALL AT [phone] X [phone].   THE PATIENT IS ENCOURAGED TO PRACTICE SOCIAL DISTANCING DUE TO THE COVID-19 PANDEMIC.

## 2023-08-25 ENCOUNTER — Ambulatory Visit: Payer: Self-pay

## 2023-08-25 DIAGNOSIS — M7989 Other specified soft tissue disorders: Secondary | ICD-10-CM | POA: Insufficient documentation

## 2023-08-25 DIAGNOSIS — R972 Elevated prostate specific antigen [PSA]: Secondary | ICD-10-CM | POA: Diagnosis not present

## 2023-08-25 NOTE — Patient Outreach (Signed)
Care Coordination   Follow Up Visit Note   08/25/2023 Name: Ruben Altizer MRN: [id number] DOB: [birth year]  Caryl Ferdinand is a 89 y.o. year old male who sees [name], FNP for primary care. I spoke with  [name] by phone today.  What matters to the patients health and wellness today?  Patient would like to stay free from recurrent UTI and improve the edema to his lower extremities.     Goals Addressed             This Visit's Progress    To have lower extremity edema and sleepiness evaluated by PCP   On track    Care Coordination Interventions: Evaluation of current treatment plan related to edema and patient's adherence to plan as established by provider Reviewed and discussed with patient his recent PCP follow up with Ellender Hose NP for evaluation of edema Review of patient status, including review of consultant's reports, relevant laboratory and other test results, and medications completed Determined patient is not wearing his compression stockings due to having difficulty putting them on Educated patient about wearing compression socks, in place of stockings if he is unable to adhere to wearing his compression stockings and patient verbalizes understanding  Instructed patient to keep his PCP informed of new symptoms or concerns      To schedule a folllow up with [facility] for evaluation of elevated PSA   On track    Care Coordination Interventions: Evaluation of current treatment plan related to elevated PSA with symptoms of impaired urinary elimination and patient's adherence to plan as established by provider Reviewed and discussed patient's recent visit with [facility] Determined patient completed some labs but otherwise was advised to f/u in 1 year Reviewed medications with patient and discussed importance of medication adherence Determined patient feels his symptoms have improved but not resolved Instructed patient to contact [facility] to  report new symptoms or concerns related to his urinary health, promptly and patient verbalizes understanding    Interventions Today    Flowsheet Row Most Recent Value  Chronic Disease   Chronic disease during today's visit Other  [edema,  recurrent UTI]  General Interventions   General Interventions Discussed/Reviewed General Interventions Discussed, General Interventions Reviewed, Doctor Visits  Doctor Visits Discussed/Reviewed Doctor Visits Discussed, Doctor Visits Reviewed, PCP, Specialist  Education Interventions   Education Provided Provided Education  Provided Verbal Education On When to see the doctor, Medication  Pharmacy Interventions   Pharmacy Dicussed/Reviewed Pharmacy Topics Discussed, Pharmacy Topics Reviewed, Medications and their functions          SDOH assessments and interventions completed:  No     Care Coordination Interventions:  Yes, provided   Follow up plan: Follow up call scheduled for 10/20/23 @1 :00 PM     Encounter Outcome:  Patient Visit Completed

## 2023-08-25 NOTE — Patient Instructions (Signed)
Visit Information  Thank you for taking time to visit with me today. Please don't hesitate to contact me if I can be of assistance to you.   Following are the goals we discussed today:   Goals Addressed             This Visit's Progress    To have lower extremity edema and sleepiness evaluated by PCP   On track    Care Coordination Interventions: Evaluation of current treatment plan related to edema and patient's adherence to plan as established by provider Reviewed and discussed with patient his recent PCP follow up with Ellender Hose NP for evaluation of edema Review of patient status, including review of consultant's reports, relevant laboratory and other test results, and medications completed Determined patient is not wearing his compression stockings due to having difficulty putting them on Educated patient about wearing compression socks, in place of stockings if he is unable to adhere to wearing his compression stockings and patient verbalizes understanding  Instructed patient to keep his PCP informed of new symptoms or concerns       To schedule a folllow up with [facility] for evaluation of elevated PSA   On track    Care Coordination Interventions: Evaluation of current treatment plan related to elevated PSA with symptoms of impaired urinary elimination and patient's adherence to plan as established by provider Reviewed and discussed patient's recent visit with [facility] Determined patient completed some labs but otherwise was advised to f/u in 1 year Reviewed medications with patient and discussed importance of medication adherence Determined patient feels his symptoms have improved but not resolved Instructed patient to contact [facility] to report new symptoms or concerns related to his urinary health, promptly and patient verbalizes understanding        Our next appointment is by telephone on 10/20/23 at 1:00 PM   Please call the care guide team at  [phone] if you need to cancel or reschedule your appointment.   If you are experiencing a Mental Health or Behavioral Health Crisis or need someone to talk to, please call 1-800-273-TALK (toll free, 24 hour hotline)  The patient verbalized understanding of instructions, educational materials, and care plan provided today and DECLINED offer to receive copy of patient instructions, educational materials, and care plan.   [name] RN BSN CCM [place]  [facility], [facility] Nurse Care Coordinator  Direct Dial: [phone] Website: [email or web address]

## 2023-08-25 NOTE — Assessment & Plan Note (Signed)
Advised to use TED hose daily .

## 2023-08-25 NOTE — Assessment & Plan Note (Signed)
Encouraged to keep BP well controlled and avoid use of NSAIDs

## 2023-08-25 NOTE — Assessment & Plan Note (Signed)
Visited ER 08/06/2023

## 2023-08-25 NOTE — Assessment & Plan Note (Signed)
Uses hearing aids; but states he has lost it.

## 2023-08-26 DIAGNOSIS — N1831 Chronic kidney disease, stage 3a: Secondary | ICD-10-CM | POA: Insufficient documentation

## 2023-08-26 DIAGNOSIS — I130 Hypertensive heart and chronic kidney disease with heart failure and stage 1 through stage 4 chronic kidney disease, or unspecified chronic kidney disease: Secondary | ICD-10-CM | POA: Insufficient documentation

## 2023-08-26 NOTE — Assessment & Plan Note (Signed)
Encouraged to keep BP well controlled and avoid use of NSAIDs

## 2023-09-15 ENCOUNTER — Telehealth: Payer: Self-pay | Admitting: Cardiology

## 2023-09-15 DIAGNOSIS — R600 Localized edema: Secondary | ICD-10-CM

## 2023-09-15 DIAGNOSIS — Z79899 Other long term (current) drug therapy: Secondary | ICD-10-CM

## 2023-09-15 NOTE — Telephone Encounter (Signed)
Spoke with the patient who reports that he has been having swelling in his left leg for about a week now. He states that it is painful. He has had similar symptoms in the past. He was  recently in the ED and had a lower extremity US that was normal. Denies any shortness of breath or chest pain. He has been elevating his leg which helps some. He confirms that he is taking Plavix. Will make Dr. Rosemary Holms aware for further recommendations.

## 2023-09-15 NOTE — Telephone Encounter (Signed)
Pt is returning phone call ° °

## 2023-09-15 NOTE — Telephone Encounter (Signed)
Pt returning nurses phone call. Please advise ?

## 2023-09-15 NOTE — Telephone Encounter (Signed)
Pt c/o swelling/edema: STAT if pt has developed SOB within 24 hours  If swelling, where is the swelling located?   Left leg  How much weight have you gained and in what time span? Yes  Have you gained 2 pounds in a day or 5 pounds in a week?   5 pounds in a week  Do you have a log of your daily weights (if so, list)?   No  Are you currently taking a fluid pill? Yes  Are you currently SOB?   No  Have you traveled recently in a car or plane for an extended period of time?   No  Patient is concerned he has been having a lot of swelling in his left leg.

## 2023-09-15 NOTE — Telephone Encounter (Signed)
Left message for patient to call back  

## 2023-09-15 NOTE — Telephone Encounter (Signed)
I've not seen him in almost a year. Please add to my schedule on 10/24 (quarter day) at 10:20.  Thanks MJP

## 2023-09-16 MED ORDER — FUROSEMIDE 40 MG PO TABS
40.0000 mg | ORAL_TABLET | Freq: Two times a day (BID) | ORAL | 1 refills | Status: DC
Start: 2023-09-16 — End: 2023-10-30

## 2023-09-16 NOTE — Addendum Note (Signed)
Addended by: Loa Socks on: 09/16/2023 03:21 PM   Modules accepted: Orders

## 2023-09-16 NOTE — Telephone Encounter (Signed)
Patwardhan, Anabel Bene, MD  You6 hours ago (8:51 AM)    Ok. For now, we can check BMP and proBNP and increase lasix to 40 mg bid.   Thanks MJP        Pt aware we will increase his lasix to 40 mg po bid and he will need to come into the office for lab work to check his BMET and PRO-BNP.  Confirmed the pharmacy of choice with the pt.   Pt states he will come to the office tomorrow 10/23 for his lab work to check a bmet and pro-bnp.  Pt aware to keep his follow-up appt as scheduled with Dr. Rosemary Holms on Monday 10/28 at 0900.   Pt verbalized understanding and agrees with this plan.

## 2023-09-16 NOTE — Telephone Encounter (Signed)
Spoke with the pt about Dr. Damian Leavell recommendation to bring him in for a visit to reassess his complaints, being it has been a year since he's last seen him in the clinic.   Pt states he can come in next week to see Dr. Rosemary Holms, if there is an availability.   Scheduled the pt in an opening on Dr. Damian Leavell schedule for next Monday 10/28 at 0900.  Advised the pt to arrive 15-20 mins prior to this appt. Pt aware of new office address/location.  Advised the pt to start wearing his compression stockings during the day, watch his salt intake, and elevate his lower extremities at rest.   ED precautions provided to the pt in the interim, if he were to experience symptoms of sob, doe, or chest pain.   Pt verbalized understanding and agrees with this plan.  Pt was gracious for all the assistance provided.   Will send this message back to Dr. Rosemary Holms to make him aware of this plan.

## 2023-09-16 NOTE — Telephone Encounter (Signed)
Ok. For now, we can check BMP and proBNP and increase lasix to 40 mg bid.  Thanks MJP

## 2023-09-16 NOTE — Telephone Encounter (Signed)
Patwardhan, Anabel Bene, MD  Loa Socks, LPN Caller: Unspecified (Yesterday,  2:25 PM) Ok. For now, we can check BMP and proBNP and increase lasix to 40 mg bid.  Thanks MJP   Left the pt a message to call the office back to endorse medication/lab recommendations as indicated above per Dr. Rosemary Holms.

## 2023-09-16 NOTE — Telephone Encounter (Signed)
Pt states he just received a phone call. Please advise

## 2023-09-17 DIAGNOSIS — R600 Localized edema: Secondary | ICD-10-CM | POA: Diagnosis not present

## 2023-09-17 DIAGNOSIS — Z79899 Other long term (current) drug therapy: Secondary | ICD-10-CM | POA: Diagnosis not present

## 2023-09-18 LAB — BASIC METABOLIC PANEL
BUN/Creatinine Ratio: 19 (ref 10–24)
BUN: 23 mg/dL (ref 8–27)
CO2: 23 mmol/L (ref 20–29)
Calcium: 9.3 mg/dL (ref 8.6–10.2)
Chloride: 107 mmol/L — ABNORMAL HIGH (ref 96–106)
Creatinine, Ser: 1.19 mg/dL (ref 0.76–1.27)
Glucose: 88 mg/dL (ref 70–99)
Potassium: 4.8 mmol/L (ref 3.5–5.2)
Sodium: 143 mmol/L (ref 134–144)
eGFR: 59 mL/min/{1.73_m2} — ABNORMAL LOW (ref >59)

## 2023-09-18 LAB — PRO B NATRIURETIC PEPTIDE: NT-Pro BNP: 213 pg/mL (ref 0–486)

## 2023-09-18 NOTE — Progress Notes (Signed)
Labs reviewed. Will discuss with the patient on 10/28. You do not need to call the patient.  Thanks MJP

## 2023-09-22 ENCOUNTER — Ambulatory Visit: Payer: Medicare HMO | Attending: Cardiology | Admitting: Cardiology

## 2023-09-22 ENCOUNTER — Encounter: Payer: Self-pay | Admitting: Cardiology

## 2023-09-22 VITALS — BP 108/56 | HR 52 | Resp 16 | Ht 72.0 in | Wt 237.0 lb

## 2023-09-22 DIAGNOSIS — I739 Peripheral vascular disease, unspecified: Secondary | ICD-10-CM

## 2023-09-22 DIAGNOSIS — R600 Localized edema: Secondary | ICD-10-CM

## 2023-09-22 NOTE — Patient Instructions (Signed)
Medication Instructions:   Your physician recommends that you continue on your current medications as directed. Please refer to the Current Medication list given to you today.  *If you need a refill on your cardiac medications before your next appointment, please call your pharmacy*   Testing/Procedures:  Your physician has requested that you have a LEFT lower extremity venous duplex. This test is an ultrasound of the veins in the legs or arms. It looks at venous blood flow that carries blood from the heart to the legs or arms. Allow one hour for a Lower Venous exam. Allow thirty minutes for an Upper Venous exam. There are no restrictions or special instructions.  PER DR. PATWARDHAN, PREFER HAVING PATIENT HAVE THIS DONE SOMETIME THIS WEEK IF POSSIBLE     Follow-Up:  3 MONTHS WITH DR. PATWARDHAN OR AN EXTENDER

## 2023-09-22 NOTE — Progress Notes (Signed)
Cardiology Office Note:  .   Date:  09/22/2023  ID:  Jesus Lam, DOB 04/08/1933, MRN [id number] PCP: [name], FNP  [facility] Providers Cardiologist:  Truett Mainland, MD PCP: [name], FNP  Chief Complaint  Patient presents with   Essential hypertension   Peripheral artery disease    Follow-up      History of Present Illness: .    Jesus Lam is a 89 y.o. male with hypertension, PAD, h/o left temporal occipital cortical infarct (05/2018), with leg pain  Patient made today's appointment due to ongoing left leg pain.  This seems to hurt him on pressing on his calf, but does not hurt with exertion.  For example, he is able to walk without any claudication.  He swims regularly, 45 minutes 3 times a week without any leg pain.  Vitals:   09/22/23 0854  BP: (!) 108/56  Pulse: (!) 52  Resp: 16  SpO2: 96%     ROS:  Review of Systems  Cardiovascular:  Negative for chest pain, dyspnea on exertion, leg swelling, palpitations and syncope.  Musculoskeletal:        Left leg pain      Studies Reviewed: Marland Kitchen       EKG 09/22/2023: Sinus bradycardia with 1st degree A-V block Right bundle branch block Left anterior fascicular block Bifascicular block Minimal voltage criteria for LVH, may be normal variant ( R in aVL ) Septal infarct , age undetermined When compared with ECG of 06-Aug-2023 12:04, No significant change since    Labs 09/17/2023: eGFR 59 NT proBNP 213 LDL 42, triglycerides 133 (05/2023)   Physical Exam:   Physical Exam Vitals and nursing note reviewed.  Constitutional:      General: He is not in acute distress. Neck:     Vascular: No JVD.  Cardiovascular:     Rate and Rhythm: Normal rate and regular rhythm.     Pulses: Decreased pulses.     Heart sounds: Normal heart sounds. No murmur heard. Pulmonary:     Effort: Pulmonary effort is normal.     Breath sounds: Normal breath sounds. No wheezing or rales.  Musculoskeletal:         General: Swelling (Left leg swelling and calf tenderness) present.     Right lower leg: Edema (Trace) present.     Left lower leg: Edema (1+) present.      VISIT DIAGNOSES:   ICD-10-CM   1. Leg edema  R60.0 VAS [place] LOWER EXTREMITY VENOUS (DVT)    2. PAD (peripheral artery disease) (HCC)  I73.9 EKG 12-Lead    VAS [place] LOWER EXTREMITY VENOUS (DVT)       ASSESSMENT AND PLAN: .    Jesus Lam is a 89 y.o. male with hypertension, PAD, h/o left temporal occipital cortical infarct (05/2018), with leg pain  Left leg pain: Mild calf tenderness, increased warmth and swelling. Suspect DVT. Will obtain urgent ultrasound study. While he does have PAD, I do not see any resting leg ischemia, he does not have any claudication symptoms.  Therefore, my suspicion for PAD being the cause of his leg pain is low.   Hypertension: Controlled  Mixed hyperlipidemia: Well controlled   F/u in 3 months   Signed, Elder Negus, MD

## 2023-09-24 ENCOUNTER — Encounter (HOSPITAL_COMMUNITY): Payer: Self-pay

## 2023-09-24 ENCOUNTER — Other Ambulatory Visit: Payer: Self-pay | Admitting: Cardiology

## 2023-09-24 ENCOUNTER — Other Ambulatory Visit (HOSPITAL_COMMUNITY): Payer: Self-pay

## 2023-09-24 ENCOUNTER — Ambulatory Visit (HOSPITAL_BASED_OUTPATIENT_CLINIC_OR_DEPARTMENT_OTHER)
Admission: RE | Admit: 2023-09-24 | Discharge: 2023-09-24 | Disposition: A | Discharge status: Home / Self Care | Payer: Medicare HMO | Source: Ambulatory Visit | Attending: Cardiology | Admitting: Cardiology

## 2023-09-24 ENCOUNTER — Ambulatory Visit (HOSPITAL_COMMUNITY)
Admission: RE | Admit: 2023-09-24 | Discharge: 2023-09-24 | Disposition: A | Discharge status: Home / Self Care | Payer: Medicare HMO | Source: Ambulatory Visit | Attending: Cardiology | Admitting: Cardiology

## 2023-09-24 VITALS — BP 138/63 | HR 58

## 2023-09-24 DIAGNOSIS — I82452 Acute embolism and thrombosis of left peroneal vein: Secondary | ICD-10-CM | POA: Insufficient documentation

## 2023-09-24 DIAGNOSIS — I739 Peripheral vascular disease, unspecified: Secondary | ICD-10-CM | POA: Insufficient documentation

## 2023-09-24 DIAGNOSIS — R600 Localized edema: Secondary | ICD-10-CM | POA: Diagnosis not present

## 2023-09-24 HISTORY — DX: Acute embolism and thrombosis of left peroneal vein: I82.452

## 2023-09-24 MED ORDER — APIXABAN (ELIQUIS) VTE STARTER PACK (10MG AND 5MG)
ORAL_TABLET | ORAL | 0 refills | Status: DC
  Filled 2023-09-24: qty 74, 30d supply, fill #0

## 2023-09-24 NOTE — Progress Notes (Signed)
DVT Clinic Note  Name: Jesus Lam     MRN: [id number]     DOB: [birth year]     Sex: male  PCP: [name], FNP  Today's Visit: Visit Information: Initial Visit  Referred to DVT Clinic by:  Cardiology  - Dr. Rosemary Holms [facility]) Referred to CPP by: Dr. Chestine Spore Reason for referral:  Chief Complaint  Patient presents with   DVT   HISTORY OF PRESENT ILLNESS: Jesus Lam is a 89 y.o. male with PMH CVA, DVT, internal jugular thrombus, PAD, CKD, HLD, who presents after diagnosis of DVT for medication management. He appears to be very active, reports swimming for one hour on MWF and otherwise tries to get up throughout the day to move. He denies any recent injuries to his left leg. He feels like it started hurting and swelling out of nowhere and has progressively worsened over the past month. He was seen by Dr. Rosemary Holms who was concerned for recurrent DVT. Ultrasound today showed age indeterminate DVT in the left peroneal veins, which was not seen on ultrasound last month. He has previously taken Eliquis and tolerated this well.   Positive Thrombotic Risk Factors: Previous VTE, Older Age Bleeding Risk Factors: Age >65 years, Antiplatelet therapy  Negative Thrombotic Risk Factors: Recent surgery (within 3 months), Recent trauma (within 3 months), Recent admission to hospital with acute illness (within 3 months), Paralysis, paresis, or recent plaster cast immobilization of lower extremity, Central venous catheterization, Bed rest >72 hours within 3 months, Sedentary journey lasting >8 hours within 4 weeks, Pregnancy, Within 6 weeks postpartum, Recent cesarean section (within 3 months), Estrogen therapy, Testosterone therapy, Erythropoiesis-stimulating agent, Recent COVID diagnosis (within 3 months), Active cancer, Non-malignant, chronic inflammatory condition, Known thrombophilic condition, Smoking, Obesity  Rx Insurance Coverage:  Medicaid Medicare Rx Affordability: Eliquis is  $0/month. Preferred Pharmacy: Eliquis starter pack was filled during the visit today at [facility]. Refills will be sent to patient's preferred pharmacy, [facility].   Past Medical History:  Diagnosis Date   Anemia    BPH (benign prostatic hyperplasia)    Cataracts, bilateral    CVA (cerebral vascular accident) (HCC)    DVT (deep venous thrombosis) (HCC)    dvt in left leg   Dyslipidemia    Gout    Gout    HTN (hypertension)    Left hip pain 03/15/2020   Left leg pain    Osteoarthritis    PAD (peripheral artery disease) (HCC)    Stasis dermatitis    Stroke (HCC)    Vitamin D deficiency     Past Surgical History:  Procedure Laterality Date   ABDOMINAL AORTOGRAM N/A 01/13/2018   Procedure: ABDOMINAL AORTOGRAM;  Surgeon: Elder Negus, MD;  Location: [facility];  Service: Cardiovascular;  Laterality: N/A;   CATARACT EXTRACTION, BILATERAL     LEFT HEART CATH AND CORONARY ANGIOGRAPHY N/A 01/13/2018   Procedure: LEFT HEART CATH AND CORONARY ANGIOGRAPHY;  Surgeon: Elder Negus, MD;  Location: [facility];  Service: Cardiovascular;  Laterality: N/A;   LOWER EXTREMITY ANGIOGRAPHY N/A 01/13/2018   Procedure: LOWER EXTREMITY ANGIOGRAPHY;  Surgeon: Elder Negus, MD;  Location: [facility];  Service: Cardiovascular;  Laterality: N/A;   LOWER EXTREMITY ANGIOGRAPHY Right 01/27/2018   Procedure: LOWER EXTREMITY ANGIOGRAPHY;  Surgeon: Nikkole Placzek Decamp, MD;  Location: [facility];  Service: Cardiovascular;  Laterality: Right;   PERIPHERAL VASCULAR ATHERECTOMY Left 01/13/2018   Procedure: PERIPHERAL VASCULAR ATHERECTOMY;  Surgeon: Truett Mainland  J, MD;  Location: [facility];  Service: Cardiovascular;  Laterality: Left;  SFA WITH PTA DRUG COATED BALLOON   PERIPHERAL VASCULAR INTERVENTION  01/27/2018   Procedure: PERIPHERAL VASCULAR INTERVENTION;  Surgeon: Aldeen Riga Decamp, MD;  Location: [facility];  Service: Cardiovascular;;    Social  History   Socioeconomic History   Marital status: Single    Spouse name: Not on file   Number of children: 3   Years of education: Not on file   Highest education level: Not on file  Occupational History   Occupation: retired  Tobacco Use   Smoking status: Former    Current packs/day: 0.00    Average packs/day: 0.3 packs/day for 0.5 years (0.1 ttl pk-yrs)    Types: Cigarettes    Start date: 05/1978    Quit date: 1980    Years since quitting: 44.8   Smokeless tobacco: Never  Vaping Use   Vaping status: Never Used  Substance and Sexual Activity   Alcohol use: Yes    Alcohol/week: 6.0 - 7.0 standard drinks of alcohol    Types: 6 - 7 Shots of liquor per week    Comment: on occasion   Drug use: Yes    Types: Marijuana    Comment: last used x 2 hours ago   Sexual activity: Yes  Other Topics Concern   Not on file  Social History Narrative   ** Merged History Encounter **       Lives with daughter, [name]. 2 daughters live here in [place]. Son lives in [place]. 3/16- offered information on local food pantries as well as congregate meal sites. Information declined.   Social Determinants of Health   Financial Resource Strain: Low Risk  (06/04/2023)   Overall Financial Resource Strain (CARDIA)    Difficulty of Paying Living Expenses: Not hard at all  Food Insecurity: No Food Insecurity (08/12/2023)   Hunger Vital Sign    Worried About Running Out of Food in the Last Year: Never true    Ran Out of Food in the Last Year: Never true  Transportation Needs: No Transportation Needs (08/12/2023)   PRAPARE - Administrator, Civil Service (Medical): No    Lack of Transportation (Non-Medical): No  Physical Activity: Sufficiently Active (06/04/2023)   Exercise Vital Sign    Days of Exercise per Week: 3 days    Minutes of Exercise per Session: 60 min  Recent Concern: Physical Activity - Insufficiently Active (06/04/2023)   Exercise Vital Sign    Days of Exercise per  Week: 3 days    Minutes of Exercise per Session: 20 min  Stress: No Stress Concern Present (06/04/2023)   [employer] of Occupational Health - Occupational Stress Questionnaire    Feeling of Stress : Not at all  Social Connections: Moderately Integrated (06/04/2023)   Social Connection and Isolation Panel [NHANES]    Frequency of Communication with Friends and Family: More than three times a week    Frequency of Social Gatherings with Friends and Family: Three times a week    Attends Religious Services: 1 to 4 times per year    Active Member of Clubs or Organizations: Yes    Attends Banker Meetings: More than 4 times per year    Marital Status: Widowed  Intimate Partner Violence: Not At Risk (06/04/2023)   Humiliation, Afraid, Rape, and Kick questionnaire    Fear of Current or Ex-Partner: No    Emotionally Abused: No  Physically Abused: No    Sexually Abused: No    Family History  Problem Relation Age of Onset   Cancer Mother    Cancer Father    Alcohol abuse Father    Breast cancer Daughter    Stroke Daughter    CVA Daughter     Allergies as of 09/24/2023 - Review Complete 09/24/2023  Allergen Reaction Noted   Shellfish allergy Other (See Comments) 11/04/2013    Current Outpatient Medications on File Prior to Encounter  Medication Sig Dispense Refill   acetaminophen (TYLENOL) 650 MG CR tablet TAKE ONE TABLET BY MOUTH TWICE A DAY 60 tablet 4   amLODipine (NORVASC) 5 MG tablet Take 1 tablet (5 mg total) by mouth daily. 180 tablet 3   atorvastatin (LIPITOR) 40 MG tablet TAKE TWO TABLETS BY MOUTH DAILY 180 tablet 10   azelastine (ASTELIN) 0.1 % nasal spray Place 2 sprays into both nostrils 2 (two) times daily. Use in each nostril as directed 30 mL 5   bisoprolol (ZEBETA) 5 MG tablet Take 0.5 tablets (2.5 mg total) by mouth daily. 60 tablet 2   Cholecalciferol (VITAMIN D3) 5000 units CAPS Take 5,000 Units by mouth daily.     clopidogrel (PLAVIX) 75 MG tablet  TAKE ONE TABLET BY MOUTH DAILY 90 tablet 0   diclofenac Sodium (VOLTAREN) 1 % GEL APPLY TWO GRAMS TOPICALLY FOUR TIMES A DAY 100 g 1   donepezil (ARICEPT) 10 MG tablet TAKE 1 TABLET AT BEDTIME 90 tablet 3   furosemide (LASIX) 40 MG tablet Take 1 tablet (40 mg total) by mouth 2 (two) times daily. 180 tablet 1   gabapentin (NEURONTIN) 300 MG capsule TAKE ONE CAPSULE BY MOUTH AT BEDTIME 90 capsule 0   hydrALAZINE (APRESOLINE) 50 MG tablet Take 1 tablet (50 mg total) by mouth 3 (three) times daily. 270 tablet 3   ketorolac (ACULAR) 0.5 % ophthalmic solution ketorolac 0.5 % eye drops     levocetirizine (XYZAL) 5 MG tablet TAKE ONE TABLET BY MOUTH EVERY EVENING 90 tablet 4   Menthol-Methyl Salicylate (MUSCLE RUB) 10-15 % CREA muscle rub     Multiple Vitamins-Minerals (COMPLETE SENIOR PO) 1 tablet daily.      tamsulosin (FLOMAX) 0.4 MG CAPS capsule TAKE ONE CAPSULE BY MOUTH DAILY HALF HOUR FOLLOWING THE SAME MEAL DAILY 90 capsule 4   vitamin B-12 (CYANOCOBALAMIN) 500 MCG tablet Take 500 mcg by mouth daily.     vitamin C (ASCORBIC ACID) 500 MG tablet Take 500 mg daily by mouth.     Elastic Bandages & Supports (MEDICAL COMPRESSION STOCKINGS) MISC      Current Facility-Administered Medications on File Prior to Encounter  Medication Dose Route Frequency Provider Last Rate Last Admin   0.9 %  sodium chloride infusion   Intravenous PRN Ghumman, Ramandeep, NP       REVIEW OF SYSTEMS:  Review of Systems  Respiratory:  Negative for shortness of breath.   Cardiovascular:  Positive for leg swelling. Negative for chest pain and palpitations.  Musculoskeletal:  Positive for myalgias.  Neurological:  Positive for tingling. Negative for dizziness.   PHYSICAL EXAMINATION:  Vitals:   09/24/23 1556  BP: 138/63  Pulse: (!) 58  SpO2: 99%   Physical Exam Pulmonary:     Effort: Pulmonary effort is normal.  Musculoskeletal:        General: Tenderness present.     Left lower leg: Edema (2+) present.    Villalta Score for Post-Thrombotic Syndrome: Pain: Severe Cramps: Severe  Heaviness: Severe Paresthesia: Moderate Pruritus: Absent Pretibial Edema: Moderate Skin Induration: Absent Hyperpigmentation: Absent Redness: Mild Venous Ectasia: Absent Pain on calf compression: Moderate Villalta Preliminary Score: 16 Is venous ulcer present?: No If venous ulcer is present and score is <15, then 15 points total are assigned: Absent Villalta Total Score: 16  LABS:  CBC     Component Value Date/Time   WBC 5.3 08/06/2023 1130   RBC 3.78 (L) 08/06/2023 1130   HGB 11.0 (L) 08/06/2023 1130   HGB 11.6 (L) 01/20/2023 1255   HCT 36.6 (L) 08/06/2023 1130   HCT 37.6 01/20/2023 1255   PLT 186 08/06/2023 1130   PLT 169 01/20/2023 1255   MCV 96.8 08/06/2023 1130   MCV 95 01/20/2023 1255   MCH 29.1 08/06/2023 1130   MCHC 30.1 08/06/2023 1130   RDW 13.7 08/06/2023 1130   RDW 13.3 01/20/2023 1255   LYMPHSABS 1.6 08/06/2023 1130   MONOABS 0.6 08/06/2023 1130   EOSABS 0.1 08/06/2023 1130   BASOSABS 0.0 08/06/2023 1130    Hepatic Function      Component Value Date/Time   PROT 7.8 08/06/2023 1130   PROT 7.2 01/20/2023 1255   ALBUMIN 3.4 (L) 08/06/2023 1130   ALBUMIN 4.0 01/20/2023 1255   AST 19 08/06/2023 1130   ALT 17 08/06/2023 1130   ALKPHOS 72 08/06/2023 1130   BILITOT 0.6 08/06/2023 1130   BILITOT 0.5 01/20/2023 1255    Renal Function   Lab Results  Component Value Date   CREATININE 1.19 09/17/2023   CREATININE 1.15 08/06/2023   CREATININE 1.27 06/09/2023    Estimated Creatinine Clearance: 54.4 mL/min (by C-G formula based on SCr of 1.19 mg/dL).   [facility] Vascular Lab Studies:  09/24/23 VAS [place] LOWER EXTREMITY VENOUS LEFT (DVT) Summary:  RIGHT:  - No evidence of common femoral vein obstruction.    LEFT:  - Findings consistent with age indeterminate deep vein thrombosis  involving the left peroneal veins.  - No cystic structure found in the popliteal fossa.  - All other  veins visualized appear fully compressible and demonstrate  appropriate Doppler characteristics.   ASSESSMENT: Location of DVT: Left distal vein Cause of DVT: unprovoked  Patient with prior history of LLE DVT and internal jugular thrombus in 2023, now with swelling and pain for the past month. He was seen in the ED 08/06/23 and had an ultrasound for peripheral edema but no DVT was seen. His doppler today showed age indeterminate DVT in the left peroneal veins. Will start anticoagulation with Eliquis VTE starter pack dosing, which was filled during his visit today for $0. He has previously taken Eliquis and tolerated it well. Eliquis is also preferred over Xarelto in patients >8 years old due to lower incidence of bleeding. Extensively counseled him on the medication. Given that this is the patient's third incidence of VTE, would recommend continuing anticoagulation lifelong as long as benefits outweigh the risks. Will reach out to Dr. Rosemary Holms who is prescribing his Plavix to see if he wants to continue Plavix or hold while on Eliquis. Will then reach out to the patient's pharmacy if any changes need to be made to Plavix, since it sounds like they send him his medications in a blister pack for adherence and the patient is doubtful to know which pill in it is Plavix. Will also send in Eliquis refills at that time.   PLAN: -Start apixaban (Eliquis) 10 mg twice daily for 7 days followed by 5 mg twice daily. -Expected duration  of therapy: Indefinite. Therapy started on 09/24/23. -Patient educated on purpose, proper use and potential adverse effects of apixaban (Eliquis). -Discussed importance of taking medication around the same time every day. -Advised patient of medications to avoid (NSAIDs, aspirin doses >100 mg daily). -Educated that Tylenol (acetaminophen) is the preferred analgesic to lower the risk of bleeding. -Advised patient to alert all providers of anticoagulation therapy prior to starting  a new medication or having a procedure. -Emphasized importance of monitoring for signs and symptoms of bleeding (abnormal bruising, prolonged bleeding, nose bleeds, bleeding from gums, discolored urine, black tarry stools). -Educated patient to present to the ED if emergent signs and symptoms of new thrombosis occur. -Counseled patient to wear compression stockings daily, removing at night. Encouraged elevation to help improve swelling.   Follow up: 1 month in DVT Clinic for adherence check. Follow up with PCP and cardiologist.   Pervis Hocking, PharmD, BCACP, CPP [facility] Clinical Pharmacist Practitioner Office: [phone]

## 2023-09-24 NOTE — Patient Instructions (Addendum)
-  Start apixaban (Eliquis) 10 mg twice daily for 7 days followed by 5 mg twice daily. -Your refills have been sent to [facility]. You may need to call the pharmacy to ask them to fill this when you start to run low on your current supply.  -It is important to take your medication around the same time every day.  -Avoid NSAIDs like ibuprofen (Advil, Motrin) and naproxen (Aleve) as well as aspirin doses over 100 mg daily. -Tylenol (acetaminophen) is the preferred over the counter pain medication to lower the risk of bleeding. -Be sure to alert all of your health care providers that you are taking an anticoagulant prior to starting a new medication or having a procedure. -Monitor for signs and symptoms of bleeding (abnormal bruising, prolonged bleeding, nose bleeds, bleeding from gums, discolored urine, black tarry stools). If you have fallen and hit your head OR if your bleeding is severe or not stopping, seek emergency care.  -Go to the emergency room if emergent signs and symptoms of new clot occur (new or worse swelling and pain in an arm or leg, shortness of breath, chest pain, fast or irregular heartbeats, lightheadedness, dizziness, fainting, coughing up blood) or if you experience a significant color change (pale or blue) in the extremity that has the DVT.  -We recommend you wear compression stockings (20-30 mmHg) as long as you are having swelling or pain. Be sure to purchase the correct size and take them off at night.   Your next visit is on Tuesday December 3rd at 1:30PM.  [facility] [address] [place], [place], [place] [address] Enter the hospital through Entrance C off [facility] and pull up to the [facility] Vascular Center entrance to the free valet parking.  Check in for your appointment at the [facility] Vascular Center.   If you have any questions or need to reschedule an appointment, please call [phone] [facility].  If you are having an emergency,  call 911 or present to the nearest emergency room.   What is a DVT?  -Deep vein thrombosis (DVT) is a condition in which a blood clot forms in a vein of the deep venous system which can occur in the lower leg, thigh, pelvis, arm, or neck. This condition is serious and can be life-threatening if the clot travels to the arteries of the lungs and causing a blockage (pulmonary embolism, PE). A DVT can also damage veins in the leg, which can lead to long-term venous disease, leg pain, swelling, discoloration, and ulcers or sores (post-thrombotic syndrome).  -Treatment may include taking an anticoagulant medication to prevent more clots from forming and the current clot from growing, wearing compression stockings, and/or surgical procedures to remove or dissolve the clot.

## 2023-09-24 NOTE — Progress Notes (Signed)
Preliminary findings, coupled with strong clinical suspicion, suggest DVT as cause of his left leg pain and edema. Recommend starting DVT dose Xarelto.  Thanks MJP

## 2023-09-25 ENCOUNTER — Telehealth (HOSPITAL_COMMUNITY): Payer: Self-pay | Admitting: Student-PharmD

## 2023-09-25 DIAGNOSIS — I82452 Acute embolism and thrombosis of left peroneal vein: Secondary | ICD-10-CM

## 2023-09-25 MED ORDER — APIXABAN 5 MG PO TABS
5.0000 mg | ORAL_TABLET | Freq: Two times a day (BID) | ORAL | 5 refills | Status: DC
Start: 2023-09-25 — End: 2024-06-07

## 2023-09-25 NOTE — Telephone Encounter (Signed)
-----   Message from [facility] sent at 09/24/2023  6:20 PM EDT ----- Regarding: RE: New DVT on Eliquis - Question re Plavix Ok to hold Plavix while on Eliquis.  Thanks MJP ----- Message ----- From: Dicie Beam, RPH-CPP Sent: 09/24/2023   5:06 PM EDT To: Elder Negus, MD Subject: New DVT on Eliquis - Question re Plavix        Hi Dr. Rosemary Holms,   We saw this patient in DVT Clinic today and started anticoagulation with Eliquis for his new DVT. You are currently prescribing his Plavix - do you want him to continue Plavix or hold it now that he is back on Eliquis? If it needs to be held, I can let the patient and his pharmacy know. It sounds like they send him his medication in an adherence blister package so they would need to remove it.   Thanks, Pervis Hocking, PharmD, [place], CPP [facility] Clinical Pharmacist Practitioner Office: [phone]

## 2023-09-25 NOTE — Telephone Encounter (Signed)
Per Dr. Rosemary Holms, hold Plavix while on Eliquis. I've removed Plavix from his medication list. I sent in Eliquis refills to [facility]. I also called the pharmacy since the patient indicated they put all of his medications in a blister pack. Explained that the patient received the starter pack yesterday so he doesn't need Eliquis added to his blister pack until 10/25/23. I also told them Plavix was discontinued so this needs to be removed from his pack going forward. They have confirmed that this will be done and Eliquis won't be added until 11/30 to ensure he doesn't take double doses of Eliquis.   Called the patient to explain that he needs to stop taking Plavix. He is unable to tell which tablet in his blister pack is Plavix. I'll call him back today when his aide is at his house to see if I can work with her to remove the correct tablets from his remaining days, and if not she can take him up to [facility] to have them removed from the package.

## 2023-09-25 NOTE — Telephone Encounter (Signed)
Called back and spoke with the patient and his aide. She does not see a description of which tablet is what, so they are going to go to [facility] to have them remove Plavix from his remaining pill packs that he has on hand. She and the patient have my contact information should any other questions arise.

## 2023-09-28 NOTE — Progress Notes (Signed)
No call needed. Patient is already on Eliquis. I have discussed this with Jesus Lam.  Thanks MJP

## 2023-10-20 ENCOUNTER — Ambulatory Visit: Payer: Self-pay

## 2023-10-20 NOTE — Patient Outreach (Signed)
  Care Coordination   10/20/2023 Name: Jesus Lam MRN: [id number] DOB: December 15, 1932   Care Coordination Outreach Attempts:  An unsuccessful telephone outreach was attempted for a scheduled appointment today.  Follow Up Plan:  Additional outreach attempts will be made to offer the patient care coordination information and services.   Encounter Outcome:  No Answer   Care Coordination Interventions:  No, not indicated    [name] RN BSN CCM [place]  [facility] Nurse Care Coordinator  Direct Dial: [phone] Website: [email or web address]

## 2023-10-24 ENCOUNTER — Other Ambulatory Visit: Payer: Self-pay | Admitting: Nurse Practitioner

## 2023-10-24 DIAGNOSIS — M19042 Primary osteoarthritis, left hand: Secondary | ICD-10-CM

## 2023-10-24 DIAGNOSIS — M19041 Primary osteoarthritis, right hand: Secondary | ICD-10-CM

## 2023-10-27 ENCOUNTER — Other Ambulatory Visit: Payer: Self-pay

## 2023-10-28 ENCOUNTER — Ambulatory Visit (HOSPITAL_COMMUNITY): Payer: Medicare HMO

## 2023-10-28 NOTE — Progress Notes (Incomplete)
DVT Clinic Note  Name: Jesus Lam     MRN: [id number]     DOB: May 30, 1933     Sex: male  PCP: [name], FNP  Today's Visit:    Referred to DVT Clinic by:  Cardiology  - Dr. Rosemary Holms [facility]) Referred to CPP by: Dr. Randie Heinz Reason for referral: No chief complaint on file.  HISTORY OF PRESENT ILLNESS: Jesus Lam is a 89 y.o. male with PMH CVA, DVT, internal jugular thrombus, PAD, CKD, HLD, who presents for follow up medication management for DVT. Last seen in DVT Clinic after DVT diagnosis on 09/24/23 at which time the patient reported being very active, swimming for one hour on MWF and otherwise getting up throughout the day to move. He denied any recent injuries to his left leg. He felt like it started hurting and swelling out of nowhere and progressively worsened over the prior month. He was seen by Dr. Rosemary Holms on 09/24/23 who was concerned for recurrent DVT. Ultrasound showed age indeterminate DVT in the left peroneal veins, which was not seen on ultrasound the month prior. He has previously taken Eliquis and tolerated it well so this was resumed. Discussed with Dr. Rosemary Holms who is holding the patient's Plavix while on Eliquis.   Today patient reports ***. *** abnormal bleeding or bruising. *** missed doses of ***. *** wearing compression stockings.  -blister packs from pharmacy adjusted?           Rx Insurance Coverage:  Medicaid Medicare Rx Affordability: Eliquis is $0/month.  Preferred Pharmacy: Eliquis starter pack was filled during initial DVT Clinic visit at [facility]. Refills sent to patient's preferred pharmacy, [facility], who fills his medications in blister pack adherence packaging. Called and spoke with pharmacy after last visit to make them aware of him starting Eliquis and stopping Plavix.   Past Medical History:  Diagnosis Date   Anemia    BPH (benign prostatic hyperplasia)    Cataracts, bilateral    CVA (cerebral vascular accident)  (HCC)    DVT (deep venous thrombosis) (HCC)    dvt in left leg   Dyslipidemia    Gout    Gout    HTN (hypertension)    Left hip pain 03/15/2020   Left leg pain    Osteoarthritis    PAD (peripheral artery disease) (HCC)    Stasis dermatitis    Stroke (HCC)    Vitamin D deficiency     Past Surgical History:  Procedure Laterality Date   ABDOMINAL AORTOGRAM N/A 01/13/2018   Procedure: ABDOMINAL AORTOGRAM;  Surgeon: Elder Negus, MD;  Location: [facility];  Service: Cardiovascular;  Laterality: N/A;   CATARACT EXTRACTION, BILATERAL     LEFT HEART CATH AND CORONARY ANGIOGRAPHY N/A 01/13/2018   Procedure: LEFT HEART CATH AND CORONARY ANGIOGRAPHY;  Surgeon: Elder Negus, MD;  Location: [facility];  Service: Cardiovascular;  Laterality: N/A;   LOWER EXTREMITY ANGIOGRAPHY N/A 01/13/2018   Procedure: LOWER EXTREMITY ANGIOGRAPHY;  Surgeon: Elder Negus, MD;  Location: [facility];  Service: Cardiovascular;  Laterality: N/A;   LOWER EXTREMITY ANGIOGRAPHY Right 01/27/2018   Procedure: LOWER EXTREMITY ANGIOGRAPHY;  Surgeon: Kawon Willcutt Decamp, MD;  Location: [facility];  Service: Cardiovascular;  Laterality: Right;   PERIPHERAL VASCULAR ATHERECTOMY Left 01/13/2018   Procedure: PERIPHERAL VASCULAR ATHERECTOMY;  Surgeon: Elder Negus, MD;  Location: [facility];  Service: Cardiovascular;  Laterality: Left;  SFA WITH PTA DRUG COATED BALLOON  PERIPHERAL VASCULAR INTERVENTION  01/27/2018   Procedure: PERIPHERAL VASCULAR INTERVENTION;  Surgeon: Keah Lamba Decamp, MD;  Location: [facility];  Service: Cardiovascular;;    Social History   Socioeconomic History   Marital status: Single    Spouse name: Not on file   Number of children: 3   Years of education: Not on file   Highest education level: Not on file  Occupational History   Occupation: retired  Tobacco Use   Smoking status: Former    Current packs/day: 0.00    Average packs/day: 0.3  packs/day for 0.5 years (0.1 ttl pk-yrs)    Types: Cigarettes    Start date: 05/1978    Quit date: 1980    Years since quitting: 44.9   Smokeless tobacco: Never  Vaping Use   Vaping status: Never Used  Substance and Sexual Activity   Alcohol use: Yes    Alcohol/week: 6.0 - 7.0 standard drinks of alcohol    Types: 6 - 7 Shots of liquor per week    Comment: on occasion   Drug use: Yes    Types: Marijuana    Comment: last used x 2 hours ago   Sexual activity: Yes  Other Topics Concern   Not on file  Social History Narrative   ** Merged History Encounter **       Lives with daughter, [name]. 2 daughters live here in [place]. Son lives in [place]. 3/16- offered information on local food pantries as well as congregate meal sites. Information declined.   Social Determinants of Health   Financial Resource Strain: Low Risk  (06/04/2023)   Overall Financial Resource Strain (CARDIA)    Difficulty of Paying Living Expenses: Not hard at all  Food Insecurity: No Food Insecurity (08/12/2023)   Hunger Vital Sign    Worried About Running Out of Food in the Last Year: Never true    Ran Out of Food in the Last Year: Never true  Transportation Needs: No Transportation Needs (08/12/2023)   PRAPARE - Administrator, Civil Service (Medical): No    Lack of Transportation (Non-Medical): No  Physical Activity: Sufficiently Active (06/04/2023)   Exercise Vital Sign    Days of Exercise per Week: 3 days    Minutes of Exercise per Session: 60 min  Recent Concern: Physical Activity - Insufficiently Active (06/04/2023)   Exercise Vital Sign    Days of Exercise per Week: 3 days    Minutes of Exercise per Session: 20 min  Stress: No Stress Concern Present (06/04/2023)   [employer] of Occupational Health - Occupational Stress Questionnaire    Feeling of Stress : Not at all  Social Connections: Moderately Integrated (06/04/2023)   Social Connection and Isolation Panel [NHANES]     Frequency of Communication with Friends and Family: More than three times a week    Frequency of Social Gatherings with Friends and Family: Three times a week    Attends Religious Services: 1 to 4 times per year    Active Member of Clubs or Organizations: Yes    Attends Banker Meetings: More than 4 times per year    Marital Status: Widowed  Intimate Partner Violence: Not At Risk (06/04/2023)   Humiliation, Afraid, Rape, and Kick questionnaire    Fear of Current or Ex-Partner: No    Emotionally Abused: No    Physically Abused: No    Sexually Abused: No    Family History  Problem Relation Age of Onset  Cancer Mother    Cancer Father    Alcohol abuse Father    Breast cancer Daughter    Stroke Daughter    CVA Daughter     Allergies as of 10/28/2023 - Review Complete 09/24/2023  Allergen Reaction Noted   Shellfish allergy Other (See Comments) 11/04/2013    Current Outpatient Medications on File Prior to Visit  Medication Sig Dispense Refill   amLODipine (NORVASC) 5 MG tablet Take 1 tablet (5 mg total) by mouth daily. 180 tablet 3   apixaban (ELIQUIS) 5 MG TABS tablet Take 1 tablet (5 mg total) by mouth 2 (two) times daily. Start taking after completion of starter pack. 60 tablet 5   APIXABAN (ELIQUIS) VTE STARTER PACK (10MG  AND 5MG ) Take as directed on package: start with two-5mg  tablets twice daily for 7 days. On day 8, switch to one-5mg  tablet twice daily. 74 each 0   atorvastatin (LIPITOR) 40 MG tablet TAKE TWO TABLETS BY MOUTH DAILY 180 tablet 10   azelastine (ASTELIN) 0.1 % nasal spray Place 2 sprays into both nostrils 2 (two) times daily. Use in each nostril as directed 30 mL 5   bisoprolol (ZEBETA) 5 MG tablet Take 0.5 tablets (2.5 mg total) by mouth daily. 60 tablet 2   Cholecalciferol (VITAMIN D3) 5000 units CAPS Take 5,000 Units by mouth daily.     diclofenac Sodium (VOLTAREN) 1 % GEL APPLY TWO GRAMS TOPICALLY FOUR TIMES A DAY 100 g 1   donepezil (ARICEPT) 10 MG  tablet TAKE 1 TABLET AT BEDTIME 90 tablet 3   Elastic Bandages & Supports (MEDICAL COMPRESSION STOCKINGS) MISC      furosemide (LASIX) 40 MG tablet Take 1 tablet (40 mg total) by mouth 2 (two) times daily. 180 tablet 1   gabapentin (NEURONTIN) 300 MG capsule TAKE ONE CAPSULE BY MOUTH AT BEDTIME 90 capsule 0   GNP 8 HOUR ARTHRITIS RELIEF 650 MG CR tablet TAKE ONE TABLET BY MOUTH TWICE A DAY 60 tablet 3   hydrALAZINE (APRESOLINE) 50 MG tablet Take 1 tablet (50 mg total) by mouth 3 (three) times daily. 270 tablet 3   ketorolac (ACULAR) 0.5 % ophthalmic solution ketorolac 0.5 % eye drops     levocetirizine (XYZAL) 5 MG tablet TAKE ONE TABLET BY MOUTH EVERY EVENING 90 tablet 4   Menthol-Methyl Salicylate (MUSCLE RUB) 10-15 % CREA muscle rub     Multiple Vitamins-Minerals (COMPLETE SENIOR PO) 1 tablet daily.      tamsulosin (FLOMAX) 0.4 MG CAPS capsule TAKE ONE CAPSULE BY MOUTH DAILY HALF HOUR FOLLOWING THE SAME MEAL DAILY 90 capsule 4   vitamin B-12 (CYANOCOBALAMIN) 500 MCG tablet Take 500 mcg by mouth daily.     vitamin C (ASCORBIC ACID) 500 MG tablet Take 500 mg daily by mouth.     Current Facility-Administered Medications on File Prior to Visit  Medication Dose Route Frequency Provider Last Rate Last Admin   0.9 %  sodium chloride infusion   Intravenous PRN Ghumman, Ramandeep, NP       REVIEW OF SYSTEMS:  ROS PHYSICAL EXAMINATION:  There were no vitals filed for this visit.  There is no height or weight on file to calculate BMI.  Physical Exam Villalta Score for Post-Thrombotic Syndrome:    LABS:  CBC     Component Value Date/Time   WBC 5.3 08/06/2023 1130   RBC 3.78 (L) 08/06/2023 1130   HGB 11.0 (L) 08/06/2023 1130   HGB 11.6 (L) 01/20/2023 1255   HCT 36.6 (L)  08/06/2023 1130   HCT 37.6 01/20/2023 1255   PLT 186 08/06/2023 1130   PLT 169 01/20/2023 1255   MCV 96.8 08/06/2023 1130   MCV 95 01/20/2023 1255   MCH 29.1 08/06/2023 1130   MCHC 30.1 08/06/2023 1130   RDW 13.7  08/06/2023 1130   RDW 13.3 01/20/2023 1255   LYMPHSABS 1.6 08/06/2023 1130   MONOABS 0.6 08/06/2023 1130   EOSABS 0.1 08/06/2023 1130   BASOSABS 0.0 08/06/2023 1130    Hepatic Function      Component Value Date/Time   PROT 7.8 08/06/2023 1130   PROT 7.2 01/20/2023 1255   ALBUMIN 3.4 (L) 08/06/2023 1130   ALBUMIN 4.0 01/20/2023 1255   AST 19 08/06/2023 1130   ALT 17 08/06/2023 1130   ALKPHOS 72 08/06/2023 1130   BILITOT 0.6 08/06/2023 1130   BILITOT 0.5 01/20/2023 1255    Renal Function   Lab Results  Component Value Date   CREATININE 1.19 09/17/2023   CREATININE 1.15 08/06/2023   CREATININE 1.27 06/09/2023    CrCl cannot be calculated (Patient's most recent lab result is older than the maximum 21 days allowed.).   VVS Vascular Lab Studies:  08/06/23 VAS [place] LOWER EXTREMITY VENOUS LEFT (DVT) Summary:  RIGHT:  - No evidence of common femoral vein obstruction.    LEFT:  - There is no evidence of deep vein thrombosis in the lower extremity.  - No cystic structure found in the popliteal fossa.   09/24/23 VAS [place] LOWER EXTREMITY VENOUS LEFT (DVT) Summary:  RIGHT:  - No evidence of common femoral vein obstruction.    LEFT:  - Findings consistent with age indeterminate deep vein thrombosis  involving the left peroneal veins.  - No cystic structure found in the popliteal fossa.  - All other veins visualized appear fully compressible and demonstrate  appropriate Doppler characteristics.   ASSESSMENT:    Patient with prior history of LLE DVT and internal jugular thrombus in 2023, now with recurrent DVT diagnosed 09/24/23. He was seen in the ED 08/06/23 and had an ultrasound for peripheral edema but no DVT was seen at that time. Swelling and pain continued, and his doppler 09/24/23 showed age indeterminate DVT in the left peroneal veins. Anticoagulation with Eliquis was started as he had previously taken Eliquis and tolerated it well and Eliquis is also preferred over Xarelto in  patients >63 years old due to lower incidence of bleeding. Given that this is the patient's third incidence of VTE, would recommend continuing anticoagulation lifelong as long as benefits outweigh the risks. Patient's cardiologist, Dr. Rosemary Holms has stopped his Plavix while on Eliquis.   ***Patient's symptoms of swelling and pain have started to improve since resuming Eliquis at his last visit. He is tolerating Eliquis well. No concerns for medication access or affordability at this time. Will continue plan for indefinite anticoagulation as long as benefits outweigh risks for recurrent VTE. No need for further DVT Clinic follow up at this time. He will continue to follow with PCP and cardiology and has appointments with both in January. We have provided him with 6 months of refills. Future refills to come from PCP or cardiologist.   PLAN: -Continue apixaban (Eliquis) 5 mg twice daily. -Expected duration of therapy: Indefinite. Therapy started on 09/24/23. -Patient educated on purpose, proper use and potential adverse effects of apixaban (Eliquis). -Discussed importance of taking medication around the same time every day. -Advised patient of medications to avoid (NSAIDs, aspirin doses >100 mg daily). -Educated that  Tylenol (acetaminophen) is the preferred analgesic to lower the risk of bleeding. -Advised patient to alert all providers of anticoagulation therapy prior to starting a new medication or having a procedure. -Emphasized importance of monitoring for signs and symptoms of bleeding (abnormal bruising, prolonged bleeding, nose bleeds, bleeding from gums, discolored urine, black tarry stools). -Educated patient to present to the ED if emergent signs and symptoms of new thrombosis occur. -Counseled patient to wear compression stockings daily, removing at night.  Follow up: PCP/cardiology. DVT Clinic available as needed.   Pervis Hocking, PharmD, [place], CPP Deep Vein Thrombosis Clinic Clinical  Pharmacist Practitioner Office: [phone]

## 2023-10-30 ENCOUNTER — Encounter (HOSPITAL_COMMUNITY): Payer: Self-pay | Admitting: Student-PharmD

## 2023-10-30 ENCOUNTER — Ambulatory Visit (HOSPITAL_COMMUNITY)
Admission: RE | Admit: 2023-10-30 | Discharge: 2023-10-30 | Disposition: A | Discharge status: Home / Self Care | Payer: Medicare HMO | Source: Ambulatory Visit | Attending: Vascular Surgery | Admitting: Vascular Surgery

## 2023-10-30 ENCOUNTER — Telehealth: Payer: Self-pay

## 2023-10-30 VITALS — BP 115/54 | HR 50

## 2023-10-30 DIAGNOSIS — I82452 Acute embolism and thrombosis of left peroneal vein: Secondary | ICD-10-CM | POA: Diagnosis not present

## 2023-10-30 NOTE — Patient Instructions (Addendum)
-  Continue apixaban (Eliquis) 5 mg twice daily. -Your refills have been sent to [facility].  -Have your aide help you put your compression stockings on every day. Be sure you can remove them at night. Ensure you have the correct stockings.  -You also need to elevate your legs daily (toes above nose - need to get your feet above your heart to help with the swelling).  -Compression and elevation will help improve your pain and swelling.  -It is important to take your medication around the same time every day.  -Avoid NSAIDs like ibuprofen (Advil, Motrin) and naproxen (Aleve) as well as aspirin doses over 100 mg daily. -Tylenol (acetaminophen) is the preferred over the counter pain medication to lower the risk of bleeding. -Be sure to alert all of your health care providers that you are taking an anticoagulant prior to starting a new medication or having a procedure. -Monitor for signs and symptoms of bleeding (abnormal bruising, prolonged bleeding, nose bleeds, bleeding from gums, discolored urine, black tarry stools). If you have fallen and hit your head OR if your bleeding is severe or not stopping, seek emergency care.  -Go to the emergency room if emergent signs and symptoms of new clot occur (new or worse swelling and pain in an arm or leg, shortness of breath, chest pain, fast or irregular heartbeats, lightheadedness, dizziness, fainting, coughing up blood) or if you experience a significant color change (pale or blue) in the extremity that has the DVT.  -We recommend you wear knee high compression stockings (20-30 mmHg; if patient can't tolerate, okay to try 15-20 mmHg) as long as you are having swelling or pain. Be sure to purchase the correct size and take them off at night.   If you have any questions or need to reschedule an appointment, please call [phone] [facility].  If you are having an emergency, call 911 or present to the nearest emergency room.   What is a DVT?  -Deep vein  thrombosis (DVT) is a condition in which a blood clot forms in a vein of the deep venous system which can occur in the lower leg, thigh, pelvis, arm, or neck. This condition is serious and can be life-threatening if the clot travels to the arteries of the lungs and causing a blockage (pulmonary embolism, PE). A DVT can also damage veins in the leg, which can lead to long-term venous disease, leg pain, swelling, discoloration, and ulcers or sores (post-thrombotic syndrome).  -Treatment may include taking an anticoagulant medication to prevent more clots from forming and the current clot from growing, wearing compression stockings, and/or surgical procedures to remove or dissolve the clot.

## 2023-10-30 NOTE — Telephone Encounter (Signed)
Patient was called about stomach pain and difficulty urinating. Patient was offered a same day appointments but patient declined stating he didn't have a ride. Patient was scheduled for the followed Monday and was advised to go to urgent care if gets worse.

## 2023-10-30 NOTE — Progress Notes (Signed)
DVT Clinic Note  Name: Jesus Lam     MRN: [id number]     DOB: 07-16-1933     Sex: male  PCP: [name], FNP  Today's Visit: Visit Information: Follow Up Visit  Referred to DVT Clinic by:  Cardiology  - Dr. Rosemary Holms [facility]) Referred to CPP by: Dr. Randie Heinz Reason for referral:  Chief Complaint  Patient presents with   Med Management - DVT   HISTORY OF PRESENT ILLNESS: Jesus Lam is a 89 y.o. male with PMH CVA, DVT, internal jugular thrombus, PAD, CKD, HLD, who presents for follow up medication management for DVT. Last seen in DVT Clinic after DVT diagnosis on 09/24/23 at which time the patient reported being very active, swimming for one hour on MWF and otherwise getting up throughout the day to move. He denied any recent injuries to his left leg. He felt like it started hurting and swelling out of nowhere and progressively worsened over the prior month. He was seen by Dr. Rosemary Holms on 09/24/23 who was concerned for recurrent DVT. Ultrasound showed age indeterminate DVT in the left peroneal veins, which was not seen on ultrasound the month prior. He has previously taken Eliquis and tolerated it well so this was resumed. Discussed with Dr. Rosemary Holms who is holding the patient's Plavix while on Eliquis.   Today patient presents in a wheelchair. Reports that he is still having pain and swelling in his leg. He is also having painful urination and has tried to contact his doctor to make an appointment. Denies abnormal bleeding or bruising. Denies missed doses of Eliquis. He receives his medications from [facility] in a blister pack. I called them after his last visit, and they have removed Plavix from his packs and added Eliquis. Has not been elevating his legs or wearing compression stockings.   Positive Thrombotic Risk Factors: Previous VTE, Older Age Bleeding Risk Factors: Age >65 years, Anticoagulant therapy  Negative Thrombotic Risk Factors: Recent surgery (within 3  months), Recent trauma (within 3 months), Recent admission to hospital with acute illness (within 3 months), Paralysis, paresis, or recent plaster cast immobilization of lower extremity, Central venous catheterization, Bed rest >72 hours within 3 months, Sedentary journey lasting >8 hours within 4 weeks, Pregnancy, Within 6 weeks postpartum, Recent cesarean section (within 3 months), Estrogen therapy, Testosterone therapy, Erythropoiesis-stimulating agent, Recent COVID diagnosis (within 3 months), Active cancer, Non-malignant, chronic inflammatory condition, Known thrombophilic condition, Smoking, Obesity  Rx Insurance Coverage:  Medicaid Medicare Rx Affordability: Eliquis is $0/month.  Preferred Pharmacy: Eliquis starter pack was filled during initial DVT Clinic visit at [facility]. Refills sent to patient's preferred pharmacy, [facility], who fills his medications in blister pack adherence packaging. Called and spoke with pharmacy after last visit to make them aware of him starting Eliquis and stopping Plavix.   Past Medical History:  Diagnosis Date   Anemia    BPH (benign prostatic hyperplasia)    Cataracts, bilateral    CVA (cerebral vascular accident) (HCC)    DVT (deep venous thrombosis) (HCC)    dvt in left leg   Dyslipidemia    Gout    Gout    HTN (hypertension)    Left hip pain 03/15/2020   Left leg pain    Osteoarthritis    PAD (peripheral artery disease) (HCC)    Stasis dermatitis    Stroke (HCC)    Vitamin D deficiency     Past Surgical History:  Procedure Laterality Date   ABDOMINAL AORTOGRAM N/A  01/13/2018   Procedure: ABDOMINAL AORTOGRAM;  Surgeon: Elder Negus, MD;  Location: [facility];  Service: Cardiovascular;  Laterality: N/A;   CATARACT EXTRACTION, BILATERAL     LEFT HEART CATH AND CORONARY ANGIOGRAPHY N/A 01/13/2018   Procedure: LEFT HEART CATH AND CORONARY ANGIOGRAPHY;  Surgeon: Elder Negus, MD;  Location: [facility];   Service: Cardiovascular;  Laterality: N/A;   LOWER EXTREMITY ANGIOGRAPHY N/A 01/13/2018   Procedure: LOWER EXTREMITY ANGIOGRAPHY;  Surgeon: Elder Negus, MD;  Location: [facility];  Service: Cardiovascular;  Laterality: N/A;   LOWER EXTREMITY ANGIOGRAPHY Right 01/27/2018   Procedure: LOWER EXTREMITY ANGIOGRAPHY;  Surgeon: Keigan Tafoya Decamp, MD;  Location: [facility];  Service: Cardiovascular;  Laterality: Right;   PERIPHERAL VASCULAR ATHERECTOMY Left 01/13/2018   Procedure: PERIPHERAL VASCULAR ATHERECTOMY;  Surgeon: Elder Negus, MD;  Location: [facility];  Service: Cardiovascular;  Laterality: Left;  SFA WITH PTA DRUG COATED BALLOON   PERIPHERAL VASCULAR INTERVENTION  01/27/2018   Procedure: PERIPHERAL VASCULAR INTERVENTION;  Surgeon: Braxten Memmer Decamp, MD;  Location: [facility];  Service: Cardiovascular;;    Social History   Socioeconomic History   Marital status: Single    Spouse name: Not on file   Number of children: 3   Years of education: Not on file   Highest education level: Not on file  Occupational History   Occupation: retired  Tobacco Use   Smoking status: Former    Current packs/day: 0.00    Average packs/day: 0.3 packs/day for 0.5 years (0.1 ttl pk-yrs)    Types: Cigarettes    Start date: 05/1978    Quit date: 1980    Years since quitting: 44.9   Smokeless tobacco: Never  Vaping Use   Vaping status: Never Used  Substance and Sexual Activity   Alcohol use: Yes    Alcohol/week: 6.0 - 7.0 standard drinks of alcohol    Types: 6 - 7 Shots of liquor per week    Comment: on occasion   Drug use: Yes    Types: Marijuana    Comment: last used x 2 hours ago   Sexual activity: Yes  Other Topics Concern   Not on file  Social History Narrative   ** Merged History Encounter **       Lives with daughter, [name]. 2 daughters live here in [place]. Son lives in [place]. 3/16- offered information on local food pantries as well as congregate  meal sites. Information declined.   Social Determinants of Health   Financial Resource Strain: Low Risk  (06/04/2023)   Overall Financial Resource Strain (CARDIA)    Difficulty of Paying Living Expenses: Not hard at all  Food Insecurity: No Food Insecurity (08/12/2023)   Hunger Vital Sign    Worried About Running Out of Food in the Last Year: Never true    Ran Out of Food in the Last Year: Never true  Transportation Needs: No Transportation Needs (08/12/2023)   PRAPARE - Administrator, Civil Service (Medical): No    Lack of Transportation (Non-Medical): No  Physical Activity: Sufficiently Active (06/04/2023)   Exercise Vital Sign    Days of Exercise per Week: 3 days    Minutes of Exercise per Session: 60 min  Recent Concern: Physical Activity - Insufficiently Active (06/04/2023)   Exercise Vital Sign    Days of Exercise per Week: 3 days    Minutes of Exercise per Session: 20 min  Stress:  No Stress Concern Present (06/04/2023)   [employer] of Occupational Health - Occupational Stress Questionnaire    Feeling of Stress : Not at all  Social Connections: Moderately Integrated (06/04/2023)   Social Connection and Isolation Panel [NHANES]    Frequency of Communication with Friends and Family: More than three times a week    Frequency of Social Gatherings with Friends and Family: Three times a week    Attends Religious Services: 1 to 4 times per year    Active Member of Clubs or Organizations: Yes    Attends Banker Meetings: More than 4 times per year    Marital Status: Widowed  Intimate Partner Violence: Not At Risk (06/04/2023)   Humiliation, Afraid, Rape, and Kick questionnaire    Fear of Current or Ex-Partner: No    Emotionally Abused: No    Physically Abused: No    Sexually Abused: No    Family History  Problem Relation Age of Onset   Cancer Mother    Cancer Father    Alcohol abuse Father    Breast cancer Daughter    Stroke Daughter    CVA  Daughter     Allergies as of 10/30/2023 - Review Complete 10/30/2023  Allergen Reaction Noted   Shellfish allergy Other (See Comments) 11/04/2013    Current Outpatient Medications on File Prior to Encounter  Medication Sig Dispense Refill   apixaban (ELIQUIS) 5 MG TABS tablet Take 1 tablet (5 mg total) by mouth 2 (two) times daily. Start taking after completion of starter pack. 60 tablet 5   furosemide (LASIX) 20 MG tablet Take 20 mg by mouth daily.     amLODipine (NORVASC) 5 MG tablet Take 1 tablet (5 mg total) by mouth daily. 180 tablet 3   atorvastatin (LIPITOR) 40 MG tablet TAKE TWO TABLETS BY MOUTH DAILY 180 tablet 10   azelastine (ASTELIN) 0.1 % nasal spray Place 2 sprays into both nostrils 2 (two) times daily. Use in each nostril as directed 30 mL 5   bisoprolol (ZEBETA) 5 MG tablet Take 0.5 tablets (2.5 mg total) by mouth daily. 60 tablet 2   Cholecalciferol (VITAMIN D3) 5000 units CAPS Take 5,000 Units by mouth daily.     diclofenac Sodium (VOLTAREN) 1 % GEL APPLY TWO GRAMS TOPICALLY FOUR TIMES A DAY 100 g 1   donepezil (ARICEPT) 10 MG tablet TAKE 1 TABLET AT BEDTIME 90 tablet 3   Elastic Bandages & Supports (MEDICAL COMPRESSION STOCKINGS) MISC      gabapentin (NEURONTIN) 300 MG capsule TAKE ONE CAPSULE BY MOUTH AT BEDTIME 90 capsule 0   GNP 8 HOUR ARTHRITIS RELIEF 650 MG CR tablet TAKE ONE TABLET BY MOUTH TWICE A DAY 60 tablet 3   hydrALAZINE (APRESOLINE) 50 MG tablet Take 1 tablet (50 mg total) by mouth 3 (three) times daily. 270 tablet 3   ketorolac (ACULAR) 0.5 % ophthalmic solution ketorolac 0.5 % eye drops     levocetirizine (XYZAL) 5 MG tablet TAKE ONE TABLET BY MOUTH EVERY EVENING 90 tablet 4   Menthol-Methyl Salicylate (MUSCLE RUB) 10-15 % CREA muscle rub     Multiple Vitamins-Minerals (COMPLETE SENIOR PO) 1 tablet daily.      tamsulosin (FLOMAX) 0.4 MG CAPS capsule TAKE ONE CAPSULE BY MOUTH DAILY HALF HOUR FOLLOWING THE SAME MEAL DAILY 90 capsule 4   vitamin B-12  (CYANOCOBALAMIN) 500 MCG tablet Take 500 mcg by mouth daily.     vitamin C (ASCORBIC ACID) 500 MG tablet Take 500 mg  daily by mouth.     Current Facility-Administered Medications on File Prior to Encounter  Medication Dose Route Frequency Provider Last Rate Last Admin   0.9 %  sodium chloride infusion   Intravenous PRN Ghumman, Ramandeep, NP       REVIEW OF SYSTEMS:  Review of Systems  Respiratory:  Negative for shortness of breath.   Cardiovascular:  Positive for leg swelling. Negative for chest pain and palpitations.  Musculoskeletal:  Positive for myalgias.  Neurological:  Positive for tingling. Negative for dizziness.   PHYSICAL EXAMINATION:  Vitals:   10/30/23 1012  BP: (!) 115/54  Pulse: (!) 50  SpO2: 96%    Physical Exam Vitals reviewed.  Cardiovascular:     Rate and Rhythm: Bradycardia present.  Pulmonary:     Effort: Pulmonary effort is normal.  Musculoskeletal:        General: Tenderness present.     Right lower leg: Edema (1+) present.     Left lower leg: Edema (2+) present.  Skin:    Findings: No bruising or erythema.  Psychiatric:        Mood and Affect: Mood normal.        Behavior: Behavior normal.        Thought Content: Thought content normal.   Villalta Score for Post-Thrombotic Syndrome: Pain: Severe Cramps: Moderate Heaviness: Moderate Paresthesia: Moderate Pruritus: Absent Pretibial Edema: Moderate Skin Induration: Mild Hyperpigmentation: Moderate Redness: Mild Venous Ectasia: Absent Pain on calf compression: Mild Villalta Preliminary Score: 16 Is venous ulcer present?: No If venous ulcer is present and score is <15, then 15 points total are assigned: Absent Villalta Total Score: 16  LABS:  CBC     Component Value Date/Time   WBC 5.3 08/06/2023 1130   RBC 3.78 (L) 08/06/2023 1130   HGB 11.0 (L) 08/06/2023 1130   HGB 11.6 (L) 01/20/2023 1255   HCT 36.6 (L) 08/06/2023 1130   HCT 37.6 01/20/2023 1255   PLT 186 08/06/2023 1130   PLT  169 01/20/2023 1255   MCV 96.8 08/06/2023 1130   MCV 95 01/20/2023 1255   MCH 29.1 08/06/2023 1130   MCHC 30.1 08/06/2023 1130   RDW 13.7 08/06/2023 1130   RDW 13.3 01/20/2023 1255   LYMPHSABS 1.6 08/06/2023 1130   MONOABS 0.6 08/06/2023 1130   EOSABS 0.1 08/06/2023 1130   BASOSABS 0.0 08/06/2023 1130    Hepatic Function      Component Value Date/Time   PROT 7.8 08/06/2023 1130   PROT 7.2 01/20/2023 1255   ALBUMIN 3.4 (L) 08/06/2023 1130   ALBUMIN 4.0 01/20/2023 1255   AST 19 08/06/2023 1130   ALT 17 08/06/2023 1130   ALKPHOS 72 08/06/2023 1130   BILITOT 0.6 08/06/2023 1130   BILITOT 0.5 01/20/2023 1255    Renal Function   Lab Results  Component Value Date   CREATININE 1.19 09/17/2023   CREATININE 1.15 08/06/2023   CREATININE 1.27 06/09/2023    CrCl cannot be calculated (Patient's most recent lab result is older than the maximum 21 days allowed.).   VVS Vascular Lab Studies:  08/06/23 VAS [place] LOWER EXTREMITY VENOUS LEFT (DVT) Summary:  RIGHT:  - No evidence of common femoral vein obstruction.    LEFT:  - There is no evidence of deep vein thrombosis in the lower extremity.  - No cystic structure found in the popliteal fossa.   09/24/23 VAS [place] LOWER EXTREMITY VENOUS LEFT (DVT) Summary:  RIGHT:  - No evidence of common femoral vein obstruction.  LEFT:  - Findings consistent with age indeterminate deep vein thrombosis  involving the left peroneal veins.  - No cystic structure found in the popliteal fossa.  - All other veins visualized appear fully compressible and demonstrate  appropriate Doppler characteristics.   ASSESSMENT: Location of DVT: Left distal vein Cause of DVT: unprovoked  Patient with prior history of LLE DVT and internal jugular thrombus in 2023, now with recurrent DVT diagnosed 09/24/23. He was seen in the ED 08/06/23 and had an ultrasound for peripheral edema but no DVT was seen at that time. Swelling and pain continued, and his doppler 09/24/23  showed age indeterminate DVT in the left peroneal veins. Anticoagulation with Eliquis was started as he had previously taken Eliquis and tolerated it well and Eliquis is also preferred over Xarelto in patients >78 years old due to lower incidence of bleeding. Given that this is the patient's third incidence of VTE, would recommend continuing anticoagulation lifelong as long as benefits outweigh the risks. Patient's cardiologist, Dr. Rosemary Holms has stopped his Plavix while on Eliquis.   Patient's symptoms of swelling and pain have not significantly improved since resuming Eliquis at his last visit likely related to his noncompliance with compression and elevation. Instructed him to have his aide help him put on his compression stockings on the days she is with him (5 days per week), and he states he would be able to remove them at night. Counseled patient on proper elevation as well. He is tolerating Eliquis well. No concerns for medication access or affordability at this time. Will continue plan for indefinite anticoagulation as long as benefits outweigh risks for recurrent VTE. No need for further DVT Clinic follow up at this time. He will continue to follow with PCP and cardiology and has appointments with both in January. We have provided him with 6 months of refills. Future refills to come from PCP or cardiologist.   Of note, he reported painful and difficult urination and has been trying to contact his PCP's office for an appointment for the past couple days. Will route note to patient's PCP. Ensured he has the correct phone number to their office and encouraged patient to continue calling as he needs to be seen for this.   PLAN: -Continue apixaban (Eliquis) 5 mg twice daily. -Expected duration of therapy: Indefinite. Therapy started on 09/24/23. -Patient educated on purpose, proper use and potential adverse effects of apixaban (Eliquis). -Discussed importance of taking medication around the same time  every day. -Advised patient of medications to avoid (NSAIDs, aspirin doses >100 mg daily). -Educated that Tylenol (acetaminophen) is the preferred analgesic to lower the risk of bleeding. -Advised patient to alert all providers of anticoagulation therapy prior to starting a new medication or having a procedure. -Emphasized importance of monitoring for signs and symptoms of bleeding (abnormal bruising, prolonged bleeding, nose bleeds, bleeding from gums, discolored urine, black tarry stools). -Educated patient to present to the ED if emergent signs and symptoms of new thrombosis occur. -Counseled patient to wear compression stockings daily, removing at night. Elevate legs daily to help with swelling.   Follow up: PCP/cardiology. DVT Clinic available as needed.   Pervis Hocking, PharmD, P[place]y [place], CPP Deep Vein Thrombosis Clinic Clinical Pharmacist Practitioner Office: [phone]

## 2023-11-03 ENCOUNTER — Encounter: Payer: Self-pay | Admitting: Nurse Practitioner

## 2023-11-03 ENCOUNTER — Ambulatory Visit (INDEPENDENT_AMBULATORY_CARE_PROVIDER_SITE_OTHER): Payer: Medicare HMO | Admitting: Nurse Practitioner

## 2023-11-03 VITALS — BP 128/64 | HR 70 | Temp 98.6°F | Ht 72.0 in | Wt 239.2 lb

## 2023-11-03 DIAGNOSIS — N1831 Chronic kidney disease, stage 3a: Secondary | ICD-10-CM

## 2023-11-03 DIAGNOSIS — R300 Dysuria: Secondary | ICD-10-CM | POA: Insufficient documentation

## 2023-11-03 DIAGNOSIS — Z23 Encounter for immunization: Secondary | ICD-10-CM | POA: Diagnosis not present

## 2023-11-03 DIAGNOSIS — R1030 Lower abdominal pain, unspecified: Secondary | ICD-10-CM

## 2023-11-03 DIAGNOSIS — E0590 Thyrotoxicosis, unspecified without thyrotoxic crisis or storm: Secondary | ICD-10-CM

## 2023-11-03 DIAGNOSIS — I131 Hypertensive heart and chronic kidney 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 failure, with stage 1 through stage 4 chronic kidney disease, or unspecified chronic kidney disease: Secondary | ICD-10-CM | POA: Diagnosis not present

## 2023-11-03 DIAGNOSIS — E7800 Pure hypercholesterolemia, unspecified: Secondary | ICD-10-CM | POA: Diagnosis not present

## 2023-11-03 HISTORY — DX: Lower abdominal pain, unspecified: R10.30

## 2023-11-03 LAB — POCT URINALYSIS DIPSTICK
Bilirubin, UA: NEGATIVE
Glucose, UA: NEGATIVE
Ketones, UA: NEGATIVE
Nitrite, UA: NEGATIVE
Protein, UA: POSITIVE — AB
Spec Grav, UA: 1.02 (ref 1.010–1.025)
Urobilinogen, UA: 0.2 U/dL
pH, UA: 6 (ref 5.0–8.0)

## 2023-11-03 NOTE — Assessment & Plan Note (Signed)
No tenderness on palpation however reports decreased appetite and intermittent abdomen pain

## 2023-11-03 NOTE — Progress Notes (Signed)
Madelaine Bhat, CMA,acting as a Neurosurgeon for [name], FNP.,have documented all relevant documentation on the behalf of [name], FNP,as directed by  [name], FNP while in the presence of [name], FNP.  Subjective:  Patient ID: Jesus Lam , male    DOB: 1933-04-27 , 89 y.o.   MRN: [id number]  Chief Complaint  Patient presents with   Abdominal Pain    HPI  Patient presents today for pain in lower stomach while urinating. Patient reports this has happened before but it is back now. He reports it is very hard for him to urinate. Patient reports he doesn't have much of a appetite he is only eating breakfast and late at night.      Past Medical History:  Diagnosis Date   Anemia    BPH (benign prostatic hyperplasia)    Cataracts, bilateral    CVA (cerebral vascular accident) (HCC)    DVT (deep venous thrombosis) (HCC)    dvt in left leg   Dyslipidemia    Gout    Gout    HTN (hypertension)    Left hip pain 03/15/2020   Left leg pain    Osteoarthritis    PAD (peripheral artery disease) (HCC)    Stasis dermatitis    Stroke (HCC)    Vitamin D deficiency      Family History  Problem Relation Age of Onset   Cancer Mother    Cancer Father    Alcohol abuse Father    Breast cancer Daughter    Stroke Daughter    CVA Daughter      Current Outpatient Medications:    amLODipine (NORVASC) 5 MG tablet, Take 1 tablet (5 mg total) by mouth daily., Disp: 180 tablet, Rfl: 3   apixaban (ELIQUIS) 5 MG TABS tablet, Take 1 tablet (5 mg total) by mouth 2 (two) times daily. Start taking after completion of starter pack., Disp: 60 tablet, Rfl: 5   atorvastatin (LIPITOR) 40 MG tablet, TAKE TWO TABLETS BY MOUTH DAILY, Disp: 180 tablet, Rfl: 10   azelastine (ASTELIN) 0.1 % nasal spray, Place 2 sprays into both nostrils 2 (two) times daily. Use in each nostril as directed, Disp: 30 mL, Rfl: 5   bisoprolol (ZEBETA) 5 MG tablet, Take 0.5 tablets (2.5 mg total) by mouth daily., Disp:  60 tablet, Rfl: 2   Cholecalciferol (VITAMIN D3) 5000 units CAPS, Take 5,000 Units by mouth daily., Disp: , Rfl:    diclofenac Sodium (VOLTAREN) 1 % GEL, APPLY TWO GRAMS TOPICALLY FOUR TIMES A DAY, Disp: 100 g, Rfl: 1   donepezil (ARICEPT) 10 MG tablet, TAKE 1 TABLET AT BEDTIME, Disp: 90 tablet, Rfl: 3   Elastic Bandages & Supports (MEDICAL COMPRESSION STOCKINGS) MISC, , Disp: , Rfl:    furosemide (LASIX) 20 MG tablet, Take 20 mg by mouth daily., Disp: , Rfl:    gabapentin (NEURONTIN) 300 MG capsule, TAKE ONE CAPSULE BY MOUTH AT BEDTIME, Disp: 90 capsule, Rfl: 0   GNP 8 HOUR ARTHRITIS RELIEF 650 MG CR tablet, TAKE ONE TABLET BY MOUTH TWICE A DAY, Disp: 60 tablet, Rfl: 3   hydrALAZINE (APRESOLINE) 50 MG tablet, Take 1 tablet (50 mg total) by mouth 3 (three) times daily., Disp: 270 tablet, Rfl: 3   ketorolac (ACULAR) 0.5 % ophthalmic solution, ketorolac 0.5 % eye drops, Disp: , Rfl:    levocetirizine (XYZAL) 5 MG tablet, TAKE ONE TABLET BY MOUTH EVERY EVENING, Disp: 90 tablet, Rfl: 4   Menthol-Methyl Salicylate (MUSCLE RUB) 10-15 %  CREA, muscle rub, Disp: , Rfl:    Multiple Vitamins-Minerals (COMPLETE SENIOR PO), 1 tablet daily. , Disp: , Rfl:    tamsulosin (FLOMAX) 0.4 MG CAPS capsule, TAKE ONE CAPSULE BY MOUTH DAILY HALF HOUR FOLLOWING THE SAME MEAL DAILY, Disp: 90 capsule, Rfl: 4   vitamin B-12 (CYANOCOBALAMIN) 500 MCG tablet, Take 500 mcg by mouth daily., Disp: , Rfl:    vitamin C (ASCORBIC ACID) 500 MG tablet, Take 500 mg daily by mouth., Disp: , Rfl:   Current Facility-Administered Medications:    0.9 %  sodium chloride infusion, , Intravenous, PRN, Ghumman, Ramandeep, NP   Allergies  Allergen Reactions   Shellfish Allergy Other (See Comments)    Gout      Review of Systems  Constitutional:  Positive for appetite change (decrease appetite over the last 3 - 4 months).  Respiratory: Negative.    Cardiovascular: Negative.   Gastrointestinal:  Negative for constipation, nausea and  vomiting.  Psychiatric/Behavioral: Negative.       Today's Vitals   11/03/23 1510  BP: 128/64  Pulse: 70  Temp: 98.6 F (37 C)  TempSrc: Oral  Weight: 239 lb 3.2 oz (108.5 kg)  Height: 6' (1.829 m)  PainSc: 10-Worst pain ever  PainLoc: Abdomen   Body mass index is 32.44 kg/m.  Wt Readings from Last 3 Encounters:  11/03/23 239 lb 3.2 oz (108.5 kg)  09/22/23 237 lb (107.5 kg)  08/21/23 237 lb 6.4 oz (107.7 kg)     Objective:  Physical Exam Constitutional:      General: He is not in acute distress.    Appearance: He is well-developed. He is obese.  HENT:     Head: Normocephalic.  Cardiovascular:     Rate and Rhythm: Normal rate and regular rhythm.     Heart sounds: Normal heart sounds. No murmur heard. Pulmonary:     Effort: Pulmonary effort is normal. No respiratory distress.     Breath sounds: Normal breath sounds.  Abdominal:     General: Bowel sounds are normal.     Tenderness: There is no abdominal tenderness.  Skin:    Capillary Refill: Capillary refill takes less than 2 seconds.  Neurological:     General: No focal deficit present.     Mental Status: He is alert.  Psychiatric:        Mood and Affect: Mood normal. Mood is not anxious.        Behavior: Behavior normal.      Assessment And Plan:  Difficult or painful urination Assessment & Plan: Urinalysis shows moderate blood, trace leuks. He may need to go back to Urology. Will send urine culture as well.   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   Orders: -     TSH + free T4  Pure hypercholesterolemia Assessment & Plan: Cholesterol levels are normal, continue statin.      Orders: -  Lipid panel  Need for influenza vaccination Assessment & Plan: Influenza vaccine administered Encouraged to take Tylenol as needed for fever or muscle aches.   Orders: -     Flu Vaccine Trivalent High Dose (Fluad)    No follow-ups on file.  Patient was given opportunity to ask questions. Patient verbalized understanding of the plan and was able to repeat key elements of the plan. All questions were answered to their satisfaction.    Jeanell Sparrow, FNP, have reviewed all documentation for this visit. The documentation on 11/03/23 for the exam, diagnosis, procedures, and orders are all accurate and complete.   IF YOU HAVE BEEN REFERRED TO A SPECIALIST, IT MAY TAKE 1-2 WEEKS TO SCHEDULE/PROCESS THE REFERRAL. IF YOU HAVE NOT HEARD FROM US/SPECIALIST IN TWO WEEKS, PLEASE GIVE [place] A CALL AT [phone].

## 2023-11-03 NOTE — Assessment & Plan Note (Signed)
Influenza vaccine administered Encouraged to take Tylenol as needed for fever or muscle aches.

## 2023-11-03 NOTE — Assessment & Plan Note (Signed)
He has not been back to Endocrinology per patient, will recheck his levels.

## 2023-11-03 NOTE — Assessment & Plan Note (Signed)
Cholesterol levels are normal, continue statin

## 2023-11-03 NOTE — Assessment & Plan Note (Signed)
Blood pressure is well controlled, continue current medications.

## 2023-11-03 NOTE — Assessment & Plan Note (Signed)
Urinalysis shows moderate blood, trace leuks. He may need to go back to Urology. Will send urine culture as well.

## 2023-11-04 LAB — LIPID PANEL
Chol/HDL Ratio: 3.4 {ratio} (ref 0.0–5.0)
Cholesterol, Total: 99 mg/dL — ABNORMAL LOW (ref 100–199)
HDL: 29 mg/dL — ABNORMAL LOW (ref >39)
LDL Chol Calc (NIH): 40 mg/dL (ref 0–99)
Triglycerides: 183 mg/dL — ABNORMAL HIGH (ref 0–149)
VLDL Cholesterol Cal: 30 mg/dL (ref 5–40)

## 2023-11-04 LAB — TSH+FREE T4
Free T4: 1.7 ng/dL (ref 0.82–1.77)
TSH: 0.054 u[IU]/mL — ABNORMAL LOW (ref 0.450–4.500)

## 2023-11-05 DIAGNOSIS — H524 Presbyopia: Secondary | ICD-10-CM | POA: Diagnosis not present

## 2023-11-05 DIAGNOSIS — H5213 Myopia, bilateral: Secondary | ICD-10-CM | POA: Diagnosis not present

## 2023-11-06 LAB — URINE CULTURE

## 2023-11-10 ENCOUNTER — Encounter: Payer: Self-pay | Admitting: Nurse Practitioner

## 2023-11-21 ENCOUNTER — Ambulatory Visit
Admission: RE | Admit: 2023-11-21 | Discharge: 2023-11-21 | Disposition: A | Discharge status: Home / Self Care | Payer: Medicare HMO | Source: Ambulatory Visit | Attending: Nurse Practitioner | Admitting: Nurse Practitioner

## 2023-11-21 DIAGNOSIS — R109 Unspecified abdominal pain: Secondary | ICD-10-CM | POA: Diagnosis not present

## 2023-11-21 DIAGNOSIS — R300 Dysuria: Secondary | ICD-10-CM

## 2023-11-21 DIAGNOSIS — R6889 Other general symptoms and signs: Secondary | ICD-10-CM | POA: Diagnosis not present

## 2023-11-21 DIAGNOSIS — K8020 Calculus of gallbladder without cholecystitis without obstruction: Secondary | ICD-10-CM | POA: Diagnosis not present

## 2023-11-21 DIAGNOSIS — R1030 Lower abdominal pain, unspecified: Secondary | ICD-10-CM

## 2023-11-21 DIAGNOSIS — I7143 Infrarenal abdominal aortic aneurysm, without rupture: Secondary | ICD-10-CM | POA: Diagnosis not present

## 2023-11-28 ENCOUNTER — Telehealth: Payer: Self-pay | Admitting: *Deleted

## 2023-11-28 NOTE — Progress Notes (Signed)
 Complex Care Management Care Guide Note  11/28/2023 Name: Jeramey Lanuza MRN: [id number] DOB: 1933/01/12  Granite Godman is a 89 y.o. year old male who is a primary care patient of Georgina Speaks, FNP and is actively engaged with the care management team. I reached out to [employer] by phone today to assist with re-scheduling  with the RN Case Manager.  Follow up plan: Unsuccessful telephone outreach attempt made. A HIPAA compliant phone message was left for the patient providing contact information and requesting a return call.  [facility]  Care Coordination Care Guide  Direct Dial: [phone]

## 2023-12-05 NOTE — Progress Notes (Signed)
 Complex Care Management Care Guide Note  12/05/2023 Name: Jesus Lam MRN: [id number] DOB: 1933-08-27  Jesus Lam is a 89 y.o. year old male who is a primary care patient of Georgina Speaks, FNP and is actively engaged with the care management team. I reached out to [employer] by phone today to assist with re-scheduling  with the RN Case Manager.  Follow up plan: Unsuccessful telephone outreach attempt made. A HIPAA compliant phone message was left for the patient providing contact information and requesting a return call. No additional outreaches will be made at this time.  [facility]  Care Coordination Care Guide  Direct Dial: [phone]

## 2023-12-08 ENCOUNTER — Ambulatory Visit: Payer: Medicare HMO | Admitting: Nurse Practitioner

## 2023-12-22 ENCOUNTER — Ambulatory Visit: Payer: Medicare HMO | Admitting: Cardiology

## 2023-12-23 DIAGNOSIS — R3121 Asymptomatic microscopic hematuria: Secondary | ICD-10-CM | POA: Diagnosis not present

## 2023-12-23 DIAGNOSIS — R3912 Poor urinary stream: Secondary | ICD-10-CM | POA: Diagnosis not present

## 2023-12-23 DIAGNOSIS — R300 Dysuria: Secondary | ICD-10-CM | POA: Diagnosis not present

## 2023-12-23 DIAGNOSIS — N401 Enlarged prostate with lower urinary tract symptoms: Secondary | ICD-10-CM | POA: Diagnosis not present

## 2023-12-23 DIAGNOSIS — R972 Elevated prostate specific antigen [PSA]: Secondary | ICD-10-CM | POA: Diagnosis not present

## 2024-01-20 DIAGNOSIS — R300 Dysuria: Secondary | ICD-10-CM | POA: Diagnosis not present

## 2024-01-20 DIAGNOSIS — N401 Enlarged prostate with lower urinary tract symptoms: Secondary | ICD-10-CM | POA: Diagnosis not present

## 2024-01-20 DIAGNOSIS — R3912 Poor urinary stream: Secondary | ICD-10-CM | POA: Diagnosis not present

## 2024-01-20 DIAGNOSIS — R3121 Asymptomatic microscopic hematuria: Secondary | ICD-10-CM | POA: Diagnosis not present

## 2024-01-22 DIAGNOSIS — N400 Enlarged prostate without lower urinary tract symptoms: Secondary | ICD-10-CM | POA: Diagnosis not present

## 2024-01-22 DIAGNOSIS — K8020 Calculus of gallbladder without cholecystitis without obstruction: Secondary | ICD-10-CM | POA: Diagnosis not present

## 2024-01-22 DIAGNOSIS — N2889 Other specified disorders of kidney and ureter: Secondary | ICD-10-CM | POA: Diagnosis not present

## 2024-01-22 DIAGNOSIS — N281 Cyst of kidney, acquired: Secondary | ICD-10-CM | POA: Diagnosis not present

## 2024-01-22 DIAGNOSIS — R3121 Asymptomatic microscopic hematuria: Secondary | ICD-10-CM | POA: Diagnosis not present

## 2024-02-03 ENCOUNTER — Other Ambulatory Visit (HOSPITAL_COMMUNITY): Payer: Self-pay

## 2024-02-04 ENCOUNTER — Other Ambulatory Visit (HOSPITAL_COMMUNITY): Payer: Self-pay

## 2024-02-05 ENCOUNTER — Other Ambulatory Visit (HOSPITAL_COMMUNITY): Payer: Self-pay

## 2024-02-05 DIAGNOSIS — R3912 Poor urinary stream: Secondary | ICD-10-CM | POA: Diagnosis not present

## 2024-02-05 DIAGNOSIS — R3121 Asymptomatic microscopic hematuria: Secondary | ICD-10-CM | POA: Diagnosis not present

## 2024-02-11 ENCOUNTER — Other Ambulatory Visit: Payer: Self-pay | Admitting: Urology

## 2024-02-11 DIAGNOSIS — N401 Enlarged prostate with lower urinary tract symptoms: Secondary | ICD-10-CM

## 2024-02-18 ENCOUNTER — Other Ambulatory Visit: Payer: Self-pay | Admitting: Nurse Practitioner

## 2024-02-18 DIAGNOSIS — G3184 Mild cognitive impairment, so stated: Secondary | ICD-10-CM

## 2024-03-10 ENCOUNTER — Other Ambulatory Visit: Payer: Self-pay

## 2024-03-10 MED ORDER — ATORVASTATIN CALCIUM 40 MG PO TABS: 80.0000 mg | ORAL_TABLET | Freq: Every day | ORAL | 1 refills | Status: DC

## 2024-03-22 ENCOUNTER — Emergency Department (HOSPITAL_COMMUNITY)

## 2024-03-22 ENCOUNTER — Inpatient Hospital Stay (HOSPITAL_COMMUNITY)
Admission: EM | Admit: 2024-03-22 | Discharge: 2024-03-26 | DRG: 064 | Disposition: A | Discharge status: Skilled Nursing Facility | Attending: Internal Medicine | Admitting: Internal Medicine

## 2024-03-22 ENCOUNTER — Other Ambulatory Visit: Payer: Self-pay

## 2024-03-22 ENCOUNTER — Encounter (HOSPITAL_COMMUNITY): Payer: Self-pay

## 2024-03-22 DIAGNOSIS — I110 Hypertensive heart disease with heart failure: Secondary | ICD-10-CM | POA: Diagnosis present

## 2024-03-22 DIAGNOSIS — M6281 Muscle weakness (generalized): Secondary | ICD-10-CM | POA: Diagnosis not present

## 2024-03-22 DIAGNOSIS — M6259 Muscle wasting and atrophy, not elsewhere classified, multiple sites: Secondary | ICD-10-CM | POA: Diagnosis not present

## 2024-03-22 DIAGNOSIS — E0590 Thyrotoxicosis, unspecified without thyrotoxic crisis or storm: Secondary | ICD-10-CM | POA: Diagnosis present

## 2024-03-22 DIAGNOSIS — I10 Essential (primary) hypertension: Secondary | ICD-10-CM | POA: Diagnosis not present

## 2024-03-22 DIAGNOSIS — Z91014 Allergy to mammalian meats: Secondary | ICD-10-CM

## 2024-03-22 DIAGNOSIS — Z86718 Personal history of other venous thrombosis and embolism: Secondary | ICD-10-CM

## 2024-03-22 DIAGNOSIS — I429 Cardiomyopathy, unspecified: Secondary | ICD-10-CM | POA: Diagnosis not present

## 2024-03-22 DIAGNOSIS — N400 Enlarged prostate without lower urinary tract symptoms: Secondary | ICD-10-CM | POA: Diagnosis present

## 2024-03-22 DIAGNOSIS — G319 Degenerative disease of nervous system, unspecified: Secondary | ICD-10-CM | POA: Diagnosis not present

## 2024-03-22 DIAGNOSIS — I63412 Cerebral infarction due to embolism of left middle cerebral artery: Principal | ICD-10-CM | POA: Diagnosis present

## 2024-03-22 DIAGNOSIS — R29705 NIHSS score 5: Secondary | ICD-10-CM | POA: Diagnosis present

## 2024-03-22 DIAGNOSIS — T45516A Underdosing of anticoagulants, initial encounter: Secondary | ICD-10-CM | POA: Diagnosis present

## 2024-03-22 DIAGNOSIS — R2681 Unsteadiness on feet: Secondary | ICD-10-CM | POA: Diagnosis not present

## 2024-03-22 DIAGNOSIS — Z91148 Patient's other noncompliance with medication regimen for other reason: Secondary | ICD-10-CM

## 2024-03-22 DIAGNOSIS — I6789 Other cerebrovascular disease: Secondary | ICD-10-CM | POA: Diagnosis not present

## 2024-03-22 DIAGNOSIS — I739 Peripheral vascular disease, unspecified: Secondary | ICD-10-CM | POA: Diagnosis present

## 2024-03-22 DIAGNOSIS — I2510 Atherosclerotic heart disease of native coronary artery without angina pectoris: Secondary | ICD-10-CM | POA: Diagnosis present

## 2024-03-22 DIAGNOSIS — I69323 Fluency disorder following cerebral infarction: Secondary | ICD-10-CM | POA: Diagnosis not present

## 2024-03-22 DIAGNOSIS — I440 Atrioventricular block, first degree: Secondary | ICD-10-CM | POA: Diagnosis present

## 2024-03-22 DIAGNOSIS — G9389 Other specified disorders of brain: Secondary | ICD-10-CM | POA: Diagnosis not present

## 2024-03-22 DIAGNOSIS — E66811 Obesity, class 1: Secondary | ICD-10-CM | POA: Diagnosis present

## 2024-03-22 DIAGNOSIS — I69320 Aphasia following cerebral infarction: Secondary | ICD-10-CM | POA: Diagnosis not present

## 2024-03-22 DIAGNOSIS — Z7901 Long term (current) use of anticoagulants: Secondary | ICD-10-CM

## 2024-03-22 DIAGNOSIS — I639 Cerebral infarction, unspecified: Principal | ICD-10-CM | POA: Diagnosis present

## 2024-03-22 DIAGNOSIS — I455 Other specified heart block: Secondary | ICD-10-CM | POA: Diagnosis not present

## 2024-03-22 DIAGNOSIS — Z79899 Other long term (current) drug therapy: Secondary | ICD-10-CM | POA: Diagnosis not present

## 2024-03-22 DIAGNOSIS — Z6832 Body mass index (BMI) 32.0-32.9, adult: Secondary | ICD-10-CM

## 2024-03-22 DIAGNOSIS — R531 Weakness: Secondary | ICD-10-CM | POA: Diagnosis not present

## 2024-03-22 DIAGNOSIS — Z811 Family history of alcohol abuse and dependence: Secondary | ICD-10-CM

## 2024-03-22 DIAGNOSIS — E785 Hyperlipidemia, unspecified: Secondary | ICD-10-CM | POA: Diagnosis present

## 2024-03-22 DIAGNOSIS — I63512 Cerebral infarction due to unspecified occlusion or stenosis of left middle cerebral artery: Secondary | ICD-10-CM | POA: Diagnosis not present

## 2024-03-22 DIAGNOSIS — Z87891 Personal history of nicotine dependence: Secondary | ICD-10-CM | POA: Diagnosis not present

## 2024-03-22 DIAGNOSIS — F03A0 Unspecified dementia, mild, without behavioral disturbance, psychotic disturbance, mood disturbance, and anxiety: Secondary | ICD-10-CM | POA: Diagnosis present

## 2024-03-22 DIAGNOSIS — R278 Other lack of coordination: Secondary | ICD-10-CM | POA: Diagnosis not present

## 2024-03-22 DIAGNOSIS — Z823 Family history of stroke: Secondary | ICD-10-CM

## 2024-03-22 DIAGNOSIS — Z7401 Bed confinement status: Secondary | ICD-10-CM | POA: Diagnosis not present

## 2024-03-22 DIAGNOSIS — I6782 Cerebral ischemia: Secondary | ICD-10-CM | POA: Diagnosis not present

## 2024-03-22 DIAGNOSIS — I5022 Chronic systolic (congestive) heart failure: Secondary | ICD-10-CM | POA: Diagnosis present

## 2024-03-22 DIAGNOSIS — I428 Other cardiomyopathies: Secondary | ICD-10-CM | POA: Diagnosis present

## 2024-03-22 DIAGNOSIS — F0390 Unspecified dementia without behavioral disturbance: Secondary | ICD-10-CM | POA: Diagnosis not present

## 2024-03-22 DIAGNOSIS — Z91013 Allergy to seafood: Secondary | ICD-10-CM

## 2024-03-22 DIAGNOSIS — I619 Nontraumatic intracerebral hemorrhage, unspecified: Secondary | ICD-10-CM | POA: Diagnosis present

## 2024-03-22 DIAGNOSIS — R4701 Aphasia: Secondary | ICD-10-CM | POA: Diagnosis present

## 2024-03-22 DIAGNOSIS — I63441 Cerebral infarction due to embolism of right cerebellar artery: Secondary | ICD-10-CM | POA: Diagnosis present

## 2024-03-22 DIAGNOSIS — R001 Bradycardia, unspecified: Secondary | ICD-10-CM | POA: Diagnosis not present

## 2024-03-22 DIAGNOSIS — Z803 Family history of malignant neoplasm of breast: Secondary | ICD-10-CM

## 2024-03-22 DIAGNOSIS — I672 Cerebral atherosclerosis: Secondary | ICD-10-CM | POA: Diagnosis not present

## 2024-03-22 DIAGNOSIS — G9341 Metabolic encephalopathy: Secondary | ICD-10-CM | POA: Diagnosis present

## 2024-03-22 DIAGNOSIS — I6389 Other cerebral infarction: Secondary | ICD-10-CM | POA: Diagnosis not present

## 2024-03-22 HISTORY — DX: Nontraumatic intracerebral hemorrhage, unspecified: I61.9

## 2024-03-22 LAB — COMPREHENSIVE METABOLIC PANEL WITH GFR
ALT: 15 U/L (ref 0–44)
AST: 21 U/L (ref 15–41)
Albumin: 3.3 g/dL — ABNORMAL LOW (ref 3.5–5.0)
Alkaline Phosphatase: 72 U/L (ref 38–126)
Anion gap: 10 (ref 5–15)
BUN: 16 mg/dL (ref 8–23)
CO2: 22 mmol/L (ref 22–32)
Calcium: 9.5 mg/dL (ref 8.9–10.3)
Chloride: 110 mmol/L (ref 98–111)
Creatinine, Ser: 1.15 mg/dL (ref 0.61–1.24)
GFR, Estimated: >60 mL/min (ref >60)
Glucose, Bld: 106 mg/dL — ABNORMAL HIGH (ref 70–99)
Potassium: 3.9 mmol/L (ref 3.5–5.1)
Sodium: 142 mmol/L (ref 135–145)
Total Bilirubin: 0.7 mg/dL (ref 0.0–1.2)
Total Protein: 7.6 g/dL (ref 6.5–8.1)

## 2024-03-22 LAB — CBC WITH DIFFERENTIAL/PLATELET
Abs Immature Granulocytes: 0.01 10*3/uL (ref 0.00–0.07)
Basophils Absolute: 0 10*3/uL (ref 0.0–0.1)
Basophils Relative: 0 %
Eosinophils Absolute: 0.1 10*3/uL (ref 0.0–0.5)
Eosinophils Relative: 1 %
HCT: 36.8 % — ABNORMAL LOW (ref 39.0–52.0)
Hemoglobin: 11.7 g/dL — ABNORMAL LOW (ref 13.0–17.0)
Immature Granulocytes: 0 %
Lymphocytes Relative: 37 %
Lymphs Abs: 2.3 10*3/uL (ref 0.7–4.0)
MCH: 29.8 pg (ref 26.0–34.0)
MCHC: 31.8 g/dL (ref 30.0–36.0)
MCV: 93.6 fL (ref 80.0–100.0)
Monocytes Absolute: 0.6 10*3/uL (ref 0.1–1.0)
Monocytes Relative: 10 %
Neutro Abs: 3.2 10*3/uL (ref 1.7–7.7)
Neutrophils Relative %: 52 %
Platelets: 166 10*3/uL (ref 150–400)
RBC: 3.93 MIL/uL — ABNORMAL LOW (ref 4.22–5.81)
RDW: 13.2 % (ref 11.5–15.5)
WBC: 6.3 10*3/uL (ref 4.0–10.5)
nRBC: 0 % (ref 0.0–0.2)

## 2024-03-22 LAB — I-STAT CHEM 8, ED
BUN: 17 mg/dL (ref 8–23)
Calcium, Ion: 1.21 mmol/L (ref 1.15–1.40)
Chloride: 112 mmol/L — ABNORMAL HIGH (ref 98–111)
Creatinine, Ser: 1.2 mg/dL (ref 0.61–1.24)
Glucose, Bld: 103 mg/dL — ABNORMAL HIGH (ref 70–99)
HCT: 37 % — ABNORMAL LOW (ref 39.0–52.0)
Hemoglobin: 12.6 g/dL — ABNORMAL LOW (ref 13.0–17.0)
Potassium: 3.9 mmol/L (ref 3.5–5.1)
Sodium: 145 mmol/L (ref 135–145)
TCO2: 22 mmol/L (ref 22–32)

## 2024-03-22 LAB — TSH: TSH: <0.01 u[IU]/mL — ABNORMAL LOW (ref 0.350–4.500)

## 2024-03-22 LAB — CBG MONITORING, ED: Glucose-Capillary: 94 mg/dL (ref 70–99)

## 2024-03-22 MED ORDER — FUROSEMIDE 20 MG PO TABS
20.0000 mg | ORAL_TABLET | Freq: Every day | ORAL | Status: DC
  Administered 2024-03-22 – 2024-03-26 (×5): 20 mg via ORAL
  Filled 2024-03-22 (×5): qty 1

## 2024-03-22 MED ORDER — VITAMIN D3 125 MCG (5000 UT) PO CAPS: 5000.0000 [IU] | ORAL_CAPSULE | Freq: Every day | ORAL | Status: DC

## 2024-03-22 MED ORDER — AZELASTINE HCL 0.1 % NA SOLN
2.0000 | Freq: Two times a day (BID) | NASAL | Status: DC
  Administered 2024-03-22 – 2024-03-26 (×8): 2 via NASAL
  Filled 2024-03-22 (×2): qty 30

## 2024-03-22 MED ORDER — IOHEXOL 350 MG/ML SOLN
100.0000 mL | Freq: Once | INTRAVENOUS | Status: AC | PRN
  Administered 2024-03-22: 100 mL via INTRAVENOUS

## 2024-03-22 MED ORDER — DONEPEZIL HCL 10 MG PO TABS
10.0000 mg | ORAL_TABLET | Freq: Every day | ORAL | Status: DC
  Administered 2024-03-22: 10 mg via ORAL
  Filled 2024-03-22: qty 1

## 2024-03-22 MED ORDER — GABAPENTIN 300 MG PO CAPS
300.0000 mg | ORAL_CAPSULE | Freq: Every day | ORAL | Status: DC
  Administered 2024-03-22 – 2024-03-25 (×4): 300 mg via ORAL
  Filled 2024-03-22 (×4): qty 1

## 2024-03-22 MED ORDER — APIXABAN 5 MG PO TABS
5.0000 mg | ORAL_TABLET | Freq: Two times a day (BID) | ORAL | Status: DC
  Administered 2024-03-22 – 2024-03-26 (×8): 5 mg via ORAL
  Filled 2024-03-22 (×8): qty 1

## 2024-03-22 MED ORDER — TAMSULOSIN HCL 0.4 MG PO CAPS
0.4000 mg | ORAL_CAPSULE | Freq: Every day | ORAL | Status: DC
  Administered 2024-03-22 – 2024-03-25 (×4): 0.4 mg via ORAL
  Filled 2024-03-22 (×4): qty 1

## 2024-03-22 MED ORDER — AMLODIPINE BESYLATE 5 MG PO TABS
5.0000 mg | ORAL_TABLET | Freq: Every day | ORAL | Status: DC
  Administered 2024-03-22 – 2024-03-26 (×5): 5 mg via ORAL
  Filled 2024-03-22 (×5): qty 1

## 2024-03-22 MED ORDER — BISOPROLOL FUMARATE 5 MG PO TABS
2.5000 mg | ORAL_TABLET | Freq: Every day | ORAL | Status: DC
  Administered 2024-03-22 – 2024-03-23 (×2): 2.5 mg via ORAL
  Filled 2024-03-22 (×2): qty 0.5

## 2024-03-22 MED ORDER — ATORVASTATIN CALCIUM 80 MG PO TABS
80.0000 mg | ORAL_TABLET | Freq: Every day | ORAL | Status: DC
  Administered 2024-03-22 – 2024-03-26 (×5): 80 mg via ORAL
  Filled 2024-03-22 (×5): qty 1

## 2024-03-22 NOTE — Code Documentation (Signed)
 Stroke Response Nurse Documentation Code Documentation  Ashutosh Waggener is a 89 y.o. male arriving to [facility]  via [facility] on 4/28 with past medical hx of PAD, CVA, CKD. On Eliquis  (apixaban ) daily. Code stroke was activated by ED.   Patient from home where he was LKW at 0200 and now complaining of aphasia.    Stroke team at the bedside on activation. Labs drawn and patient cleared for CT by Dr. Manus Sellers. Patient to CT with team. NIHSS 5, see documentation for details and code stroke times. Patient with left hemianopia, left arm weakness, and Expressive aphasia  on exam. The following imaging was completed:  CT Head, CTA, and CTP. Patient is not a candidate for IV Thrombolytic due to LKW 1930 yesterday. Patient is not a candidate for IR due to no LVO on CTA.   Care Plan: q2 NIHSS and vitals x12 hours.   Bedside handoff with ED RN Hannie.    Marlon Simpson  Stroke Response RN

## 2024-03-22 NOTE — ED Provider Notes (Addendum)
 [place] EMERGENCY DEPARTMENT AT [facility] Provider Note   CSN: [id number] Arrival date & time: 03/22/24  1010     History  No chief complaint on file.   Jesus Lam is a 89 y.o. male.  The history is provided by the patient and medical records. No language interpreter was used.  Neurologic Problem This is a new problem. The current episode started 6 to 12 hours ago. The problem occurs constantly. The problem has not changed since onset.Pertinent negatives include no chest pain, no abdominal pain, no headaches and no shortness of breath. Nothing aggravates the symptoms. Nothing relieves the symptoms. The treatment provided no relief.       Home Medications Prior to Admission medications   Medication Sig Start Date End Date Taking? Authorizing Provider  amLODipine  (NORVASC ) 5 MG tablet Take 1 tablet (5 mg total) by mouth daily. 04/03/23 11/03/23  Patwardhan, Kaye Parsons, MD  apixaban  (ELIQUIS ) 5 MG TABS tablet Take 1 tablet (5 mg total) by mouth 2 (two) times daily. Start taking after completion of starter pack. 09/25/23   Faye Hoops B, RPH-CPP  atorvastatin  (LIPITOR ) 40 MG tablet Take 2 tablets (80 mg total) by mouth daily. 03/10/24   Patwardhan, Kaye Parsons, MD  azelastine  (ASTELIN ) 0.1 % nasal spray Place 2 sprays into both nostrils 2 (two) times daily. Use in each nostril as directed 08/15/21   Susanna Epley, FNP  bisoprolol  (ZEBETA ) 5 MG tablet Take 0.5 tablets (2.5 mg total) by mouth daily. 07/11/23   Susanna Epley, FNP  Cholecalciferol  (VITAMIN D3) 5000 units CAPS Take 5,000 Units by mouth daily. 01/09/18   [provider]  diclofenac  Sodium (VOLTAREN ) 1 % GEL APPLY TWO GRAMS TOPICALLY FOUR TIMES A DAY 07/01/22   Moore, Janece, FNP  donepezil  (ARICEPT ) 10 MG tablet TAKE 1 TABLET AT BEDTIME 02/23/24   Susanna Epley, FNP  Elastic Bandages & Supports (MEDICAL COMPRESSION STOCKINGS) MISC  06/06/18   [provider]  furosemide  (LASIX ) 20 MG tablet Take 20 mg  by mouth daily. 10/24/23   [provider]  gabapentin  (NEURONTIN ) 300 MG capsule TAKE ONE CAPSULE BY MOUTH AT BEDTIME 01/11/21   Susanna Epley, FNP  GNP 8 HOUR ARTHRITIS RELIEF 650 MG CR tablet TAKE ONE TABLET BY MOUTH TWICE A DAY 10/27/23   Moore, Janece, FNP  hydrALAZINE  (APRESOLINE ) 50 MG tablet Take 1 tablet (50 mg total) by mouth 3 (three) times daily. 04/03/23 11/03/23  Patwardhan, Kaye Parsons, MD  ketorolac  (ACULAR ) 0.5 % ophthalmic solution ketorolac  0.5 % eye drops    [provider]  levocetirizine (XYZAL ) 5 MG tablet TAKE ONE TABLET BY MOUTH EVERY EVENING 10/22/21   Susanna Epley, FNP  Menthol-Methyl Salicylate (MUSCLE RUB) 10-15 % CREA muscle rub    [provider]  Multiple Vitamins-Minerals (COMPLETE SENIOR PO) 1 tablet daily.  06/06/18   [provider]  tamsulosin  (FLOMAX ) 0.4 MG CAPS capsule TAKE ONE CAPSULE BY MOUTH DAILY HALF HOUR FOLLOWING THE SAME MEAL DAILY 11/13/21   Susanna Epley, FNP  vitamin B-12 (CYANOCOBALAMIN ) 500 MCG tablet Take 500 mcg by mouth daily.    [provider]  vitamin C  (ASCORBIC ACID ) 500 MG tablet Take 500 mg daily by mouth.    [provider]      Allergies    Shellfish allergy    Review of Systems   Review of Systems  Constitutional:  Negative for chills, fatigue and fever.  HENT:  Negative for congestion.   Eyes:  Positive for  visual disturbance (esolved now). Negative for pain.  Respiratory:  Negative for cough, chest tightness and shortness of breath.   Cardiovascular:  Negative for chest pain.  Gastrointestinal:  Negative for abdominal pain, constipation, diarrhea, nausea and vomiting.  Genitourinary:  Negative for dysuria.  Musculoskeletal:  Positive for gait problem. Negative for back pain, neck pain and neck stiffness.  Skin:  Negative for rash and wound.  Neurological:  Positive for dizziness and speech difficulty. Negative for syncope, facial asymmetry, weakness, light-headedness, numbness  and headaches.  Psychiatric/Behavioral:  Negative for agitation and confusion.   All other systems reviewed and are negative.   Physical Exam Updated Vital Signs There were no vitals taken for this visit. Physical Exam Vitals and nursing note reviewed.  Constitutional:      General: He is not in acute distress.    Appearance: He is well-developed. He is not ill-appearing, toxic-appearing or diaphoretic.  HENT:     Head: Normocephalic and atraumatic.     Nose: No congestion or rhinorrhea.     Mouth/Throat:     Mouth: Mucous membranes are moist.     Pharynx: No oropharyngeal exudate or posterior oropharyngeal erythema.  Eyes:     Extraocular Movements: Extraocular movements intact.     Conjunctiva/sclera: Conjunctivae normal.     Pupils: Pupils are equal, round, and reactive to light.  Neck:     Vascular: No carotid bruit.  Cardiovascular:     Rate and Rhythm: Normal rate and regular rhythm.     Heart sounds: No murmur heard. Pulmonary:     Effort: Pulmonary effort is normal. No respiratory distress.     Breath sounds: Normal breath sounds. No wheezing or rhonchi.  Chest:     Chest wall: No tenderness.  Abdominal:     General: Abdomen is flat.     Palpations: Abdomen is soft.     Tenderness: There is no abdominal tenderness. There is no guarding or rebound.  Musculoskeletal:        General: No swelling or tenderness.     Cervical back: Neck supple. No tenderness.     Right lower leg: No edema.     Left lower leg: No edema.  Skin:    General: Skin is warm and dry.     Capillary Refill: Capillary refill takes less than 2 seconds.     Findings: No erythema.  Neurological:     Mental Status: He is alert.     Cranial Nerves: No facial asymmetry.     Sensory: No sensory deficit.     Motor: No weakness, tremor or abnormal muscle tone.     Coordination: Coordination abnormal. Finger-Nose-Finger Test abnormal.     Comments: Patient has expressive aphasia.  Difficulty  following commands with finger-nose-finger testing and difficulty checking visual fields to do some confusion.  Word naming difficulty.  Psychiatric:        Mood and Affect: Mood normal.     ED Results / Procedures / Treatments   Labs (all labs ordered are listed, but only abnormal results are displayed) Labs Reviewed  CBC WITH DIFFERENTIAL/PLATELET - Abnormal; Notable for the following components:      Result Value   RBC 3.93 (*)    Hemoglobin 11.7 (*)    HCT 36.8 (*)    All other components within normal limits  COMPREHENSIVE METABOLIC PANEL WITH GFR - Abnormal; Notable for the following components:   Glucose, Bld 106 (*)    Albumin 3.3 (*)  All other components within normal limits  TSH - Abnormal; Notable for the following components:   TSH <0.010 (*)    All other components within normal limits  I-STAT CHEM 8, ED - Abnormal; Notable for the following components:   Chloride 112 (*)    Glucose, Bld 103 (*)    Hemoglobin 12.6 (*)    HCT 37.0 (*)    All other components within normal limits  CBG MONITORING, ED    EKG EKG Interpretation Date/Time:  Monday March 22 2024 10:17:03 EDT Ventricular Rate:  58 PR Interval:  293 QRS Duration:  141 QT Interval:  439 QTC Calculation: 432 R Axis:   -53  Text Interpretation: Sinus rhythm Prolonged PR interval Nonspecific IVCD with LAD Left ventricular hypertrophy Anterior Q waves, possibly due to LVH when compared to prior, similar appearance No STEMI Confirmed by Wynell Heath ([id number]) on 03/22/2024 10:19:45 AM  Radiology CT ANGIO HEAD NECK W WO CM W PERF (CODE STROKE) Result Date: 03/22/2024 CLINICAL DATA:  Stroke workup EXAM: CT ANGIOGRAPHY HEAD AND NECK CT PERFUSION BRAIN TECHNIQUE: Multidetector CT imaging of the head and neck was performed using the standard protocol during bolus administration of intravenous contrast. Multiplanar CT image reconstructions and MIPs were obtained to evaluate the vascular anatomy. Carotid  stenosis measurements (when applicable) are obtained utilizing NASCET criteria, using the distal internal carotid diameter as the denominator. Multiphase CT imaging of the brain was performed following IV bolus contrast injection. Subsequent parametric perfusion maps were calculated using RAPID software. RADIATION DOSE REDUCTION: This exam was performed according to the departmental dose-optimization program which includes automated exposure control, adjustment of the mA and/or kV according to patient size and/or use of iterative reconstruction technique. CONTRAST:  OMNIPAQUE  IOHEXOL  350 MG/ML SOLN COMPARISON:  Noncontrast head CT from earlier today FINDINGS: CTA NECK FINDINGS Aortic arch: Extensive atheromatous plaque, 2 vessel branching Right carotid system: Moderate atheromatous plaque at the bifurcation without flow reducing stenosis, ulceration, or beading Left carotid system: Moderate atheromatous plaque at the bifurcation without stenosis, ulceration, or beading. Vertebral arteries: No proximal subclavian stenosis. Calcified plaque at the right vertebral origin causing mild-to-moderate stenosis. Robust flow in the left vertebral artery which is dominant. Skeleton: No acute finding Other neck: Goiter with asymmetric heterogeneous enlargement of the left lobe pattern unchanged from 2023 CT of the neck. Low-density nodule with calcification in the right lower internal jugular vein attributed to nonocclusive, chronic thrombus. Upper chest: No acute finding Review of the MIP images confirms the above findings CTA HEAD FINDINGS Anterior circulation: No major branch occlusion, beading, or proximal flow reducing stenosis. Mild atheromatous calcification at the cavernous carotids. Posterior circulation: The vertebral arteries show atheromatous calcification. No flow reducing stenosis seen in the vertebral or basilar circulation. No branch occlusion. Venous sinuses: Unremarkable Anatomic variants: None  significant Review of the MIP images confirms the above findings CT Brain Perfusion Findings: CBF (<30%) Volume: 23mL-this has a early CT correlate. Perfusion (Tmax>6.0s) volume: 21mL Mismatch Volume: 15mL Case discussed with Dr. Lindzen at time of dictation. IMPRESSION: No emergent large vessel occlusion. 6 cc acute, completed infarct in the superior left parietal cortex. There is 15 cc of adjacent penumbra intervening this infarct and a pre-existing left parietal infarct which is chronic. The affected peripheral branch vessel is not visualized. Atherosclerosis without flow reducing stenosis or embolic source seen in the proximal vasculature. Electronically Signed   By: Ronnette Coke M.D.   On: 03/22/2024 11:24   CT HEAD CODE STROKE WO  CONTRAST` Result Date: 03/22/2024 CLINICAL DATA:  Code stroke. Neuro deficit, concern for stroke, aphasia. EXAM: CT HEAD WITHOUT CONTRAST TECHNIQUE: Contiguous axial images were obtained from the base of the skull through the vertex without intravenous contrast. RADIATION DOSE REDUCTION: This exam was performed according to the departmental dose-optimization program which includes automated exposure control, adjustment of the mA and/or kV according to patient size and/or use of iterative reconstruction technique. COMPARISON:  CT head 11/30/2020, MRI head 06/13/2018. FINDINGS: Brain: No acute intracranial hemorrhage. No CT evidence of acute infarct. Redemonstrated remote infarct involving the left temporal occipital cortex and subcortical white matter. Nonspecific hypoattenuation in the periventricular and subcortical white matter favored to reflect chronic microvascular ischemic changes. No edema, mass effect, or midline shift. The basilar cisterns are patent. Ventricles: Prominence of the ventricles suggesting underlying parenchymal volume loss. Vascular: Atherosclerotic calcifications of the carotid siphons and intracranial vertebral arteries. No hyperdense vessel. Skull: No  acute or aggressive finding. Orbits: Orbits are symmetric. Sinuses: Mucous retention cyst in the right maxillary sinus. Other: Mastoid air cells are clear. 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 ASPECTS [facility] Stroke Program Early CT Score) - Ganglionic level infarction (caudate, lentiform nuclei, internal capsule, insula, M1-M3 cortex): 7 - Supraganglionic infarction (M4-M6 cortex): 3 Total score (0-10 with 10 being normal): 10 IMPRESSION: 1. No CT evidence of acute intracranial abnormality. 2. Redemonstrated remote infarct involving the left temporal occipital lobes. 3. Mild chronic microvascular ischemic changes and mild parenchymal volume loss. 4. ASPECTS is 10 These results were communicated to Dr. Lindzen At 11:07 am on 03/22/2024 by text page via the [facility] messaging system. Electronically Signed   By: Denny Flack M.D.   On: 03/22/2024 11:07    Procedures Procedures   CRITICAL CARE Performed by: Marine Sia Jourdain Guay Total critical care time: 35 minutes Critical care time was exclusive of separately billable procedures and treating other patients. Critical care was necessary to treat or prevent imminent or life-threatening deterioration. Critical care was time spent personally by me on the following activities: development of treatment plan with patient and/or surrogate as well as nursing, discussions with consultants, evaluation of patient's response to treatment, examination of patient, obtaining history from patient or surrogate, ordering and performing treatments and interventions, ordering and review of laboratory studies, ordering and review of radiographic studies, pulse oximetry and re-evaluation of patient's condition.   Medications Ordered in ED Medications - No data to display  ED Course/ Medical Decision Making/ A&P                                 Medical Decision Making Amount and/or Complexity of Data Reviewed Labs: ordered. Radiology:  ordered.  Risk Prescription drug management. Decision regarding hospitalization.    Parson Carson is a 89 y.o. male with a past medical history significant for previous stroke, hyperglyceridemia, hyperlipidemia, CKD, hypertension, previous DVT on Eliquis  therapy, and osteoarthritis who presents for neurologic complaint and a fall.  According to patient and EMS report to nursing, patient was last normal at 2 AM this morning.  He said he went to bed about 4 AM but before that ended up having a fall where he lost his balance and went to the ground.  He said he did not hit his head and has no headache or neck pain.  He says that since then he has had difficulty with his speech and cannot get his words out well.  He reports some mild blurry vision that has improved.  He reports no other fevers, chills, Jassen, cough, nausea, vomiting, constipation, diarrhea, or urinary changes.  He denies any weakness unilaterally.  On my exam, patient indeed has some expressive aphasia.  For me he had intact sensation and strength in arms and legs but he had difficulty with finger-nose-finger testing as he was getting confused to follow commands.  He also was having difficulty testing for neglect with eyes closed deciding right or left side as he was getting confused.  His glucose was reportedly in the 90s initially.  Otherwise lungs clear and chest nontender.  Abdomen tender.  He did not have a carotid bruit on my exam.  Pupils are symmetric and reactive with normal extract movements.  No nystagmus on my exam.  Clinically I am somewhat concerned that the patient is having a stroke.  As his last normal was 2 AM, he was outside of the normal window for code stroke activation however with the speech component, LVO was considered.  I called and spoke with Dr. Lindzen with neurology will come see the patient immediately to help determine if we should activate a LVO type stroke or not although he did not have any weakness.   Will go ahead and order the CTA with perfusion and labs to get started and will escalate as needed.  Anticipate reassessment after workup to determine disposition.  10:44 AM Neurology came to the bedside and does want to activate a code stroke.  Code stroke order will be placed.  11:25 AM Neurology feels patient did have a stroke and is admitted to medicine for further stroke workup.     Final Clinical Impression(s) / ED Diagnoses Final diagnoses:  Cerebrovascular accident (CVA), unspecified mechanism (HCC)  Aphasia    Clinical Impression: 1. Cerebrovascular accident (CVA), unspecified mechanism (HCC)   2. Aphasia     Disposition: Admit  This note was prepared with assistance of Dragon voice recognition software. Occasional wrong-word or sound-a-like substitutions may have occurred due to the inherent limitations of voice recognition software.     Jeremie Abdelaziz, Marine Sia, MD 03/22/24 1044    Arlethia Basso, Marine Sia, MD 03/22/24 [phone]

## 2024-03-22 NOTE — ED Triage Notes (Addendum)
 Pt bib ems from home; neighbors called, noted pt having expressive aphasia; LVO score 1 with ems; LKW 0200 this am; no other neuro deficits; hx stroke, unsure of baseline deficits, none noted by ems; pt on eliquis ; cbg 115; 160/70, 98% RA, HR 80  Neighbor who called Dorna Gasman [phone]

## 2024-03-22 NOTE — Progress Notes (Signed)
 SLP Cancellation Note  Patient Details Name: Jesus Lam MRN: [id number] DOB: 01-02-1933   Cancelled treatment:       Reason Eval/Treat Not Completed: Discussed with RN, who reports pt passed the Yale swallow screen and is on a regular diet. No further SLP f/u for swallowing is clinically indicated, although consider adding orders for a cognitive-linguistic evaluation given acute CVA.    Amil Kale, M.A., CCC-SLP Speech Language Pathology, Acute Rehabilitation Services  Secure Chat preferred [phone]  03/22/2024, 5:02 PM

## 2024-03-22 NOTE — H&P (Addendum)
 History and Physical    Patient: Jesus Lam UJW:[id number] DOB: 09-May-1933 DOA: 03/22/2024 DOS: the patient was seen and examined on 03/22/2024 PCP: Susanna Epley, FNP  Patient coming from: Home  Chief Complaint: No chief complaint on file.  HPI: Jesus Lam is a 89 y.o. 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  Pt is a poor historian 2/2 speech difficulty iso CVA. From what I can ascertain, pt was last normal on 4/28 on 0200 and activated EMS at 0400 after falling (no LOC or head strike). Pt was evaluated by ED provider iso word finding difficulty and stuttering (unclear if this is baseline), and was noted to have L parietal 15cc penumbra c/f acute stroke on CTA head/neck  Review of Systems: As mentioned in the history of present illness. All other systems reviewed and are negative.  Past Medical History:  Diagnosis Date   Anemia    BPH (benign prostatic hyperplasia)    Cataracts, bilateral    CVA (cerebral vascular accident) (HCC)    DVT (deep venous thrombosis) (HCC)    dvt in left leg   Dyslipidemia    Gout    Gout    HTN (hypertension)    Left hip pain 03/15/2020   Left leg pain    Osteoarthritis    PAD (peripheral artery disease) (HCC)    Stasis dermatitis    Stroke (HCC)    Vitamin D  deficiency    Past Surgical History:  Procedure Laterality Date   ABDOMINAL AORTOGRAM N/A 01/13/2018   Procedure: ABDOMINAL AORTOGRAM;  Surgeon: Cody Das, MD;  Location: [facility];  Service: Cardiovascular;  Laterality: N/A;   CATARACT EXTRACTION, BILATERAL     LEFT HEART CATH AND CORONARY ANGIOGRAPHY N/A 01/13/2018   Procedure: LEFT HEART CATH AND CORONARY ANGIOGRAPHY;  Surgeon: Cody Das, MD;  Location: [facility];  Service: Cardiovascular;  Laterality: N/A;   LOWER EXTREMITY ANGIOGRAPHY N/A 01/13/2018   Procedure: LOWER EXTREMITY  ANGIOGRAPHY;  Surgeon: Cody Das, MD;  Location: [facility];  Service: Cardiovascular;  Laterality: N/A;   LOWER EXTREMITY ANGIOGRAPHY Right 01/27/2018   Procedure: LOWER EXTREMITY ANGIOGRAPHY;  Surgeon: Knox Perl, MD;  Location: [facility];  Service: Cardiovascular;  Laterality: Right;   PERIPHERAL VASCULAR ATHERECTOMY Left 01/13/2018   Procedure: PERIPHERAL VASCULAR ATHERECTOMY;  Surgeon: Cody Das, MD;  Location: [facility];  Service: Cardiovascular;  Laterality: Left;  SFA WITH PTA DRUG COATED BALLOON   PERIPHERAL VASCULAR INTERVENTION  01/27/2018   Procedure: PERIPHERAL VASCULAR INTERVENTION;  Surgeon: Knox Perl, MD;  Location: [facility];  Service: Cardiovascular;;   Social History:  reports that he quit smoking about 45 years ago. His smoking use included cigarettes. He started smoking about 45 years ago. He has a 0.1 pack-year smoking history. He has never used smokeless tobacco. He reports current alcohol use of about 6.0 - 7.0 standard drinks of alcohol per week. He reports current drug use. Drug: Marijuana.  Allergies  Allergen Reactions   Shellfish Allergy Other (See Comments)    Gout     Family History  Problem Relation Age of Onset   Cancer Mother    Cancer Father    Alcohol abuse Father    Breast cancer Daughter    Stroke Daughter    CVA Daughter     Prior to Admission medications  Medication Sig Start Date End Date Taking? Authorizing Provider  amLODipine  (NORVASC ) 5 MG tablet Take 1 tablet (5 mg total) by mouth daily. 04/03/23 11/03/23  Patwardhan, Kaye Parsons, MD  apixaban  (ELIQUIS ) 5 MG TABS tablet Take 1 tablet (5 mg total) by mouth 2 (two) times daily. Start taking after completion of starter pack. 09/25/23   Faye Hoops B, RPH-CPP  atorvastatin  (LIPITOR ) 40 MG tablet Take 2 tablets (80 mg total) by mouth daily. 03/10/24   Patwardhan, Kaye Parsons, MD  azelastine  (ASTELIN ) 0.1 % nasal spray Place 2 sprays into both  nostrils 2 (two) times daily. Use in each nostril as directed 08/15/21   Susanna Epley, FNP  bisoprolol  (ZEBETA ) 5 MG tablet Take 0.5 tablets (2.5 mg total) by mouth daily. 07/11/23   Susanna Epley, FNP  Cholecalciferol  (VITAMIN D3) 5000 units CAPS Take 5,000 Units by mouth daily. 01/09/18   [provider]  diclofenac  Sodium (VOLTAREN ) 1 % GEL APPLY TWO GRAMS TOPICALLY FOUR TIMES A DAY 07/01/22   Susanna Epley, FNP  donepezil  (ARICEPT ) 10 MG tablet TAKE 1 TABLET AT BEDTIME 02/23/24   Susanna Epley, FNP  Elastic Bandages & Supports (MEDICAL COMPRESSION STOCKINGS) MISC  06/06/18   [provider]  furosemide  (LASIX ) 20 MG tablet Take 20 mg by mouth daily. 10/24/23   [provider]  gabapentin  (NEURONTIN ) 300 MG capsule TAKE ONE CAPSULE BY MOUTH AT BEDTIME 01/11/21   Susanna Epley, FNP  GNP 8 HOUR ARTHRITIS RELIEF 650 MG CR tablet TAKE ONE TABLET BY MOUTH TWICE A DAY 10/27/23   Raha Tennison, Janece, FNP  hydrALAZINE  (APRESOLINE ) 50 MG tablet Take 1 tablet (50 mg total) by mouth 3 (three) times daily. 04/03/23 11/03/23  Patwardhan, Kaye Parsons, MD  ketorolac  (ACULAR ) 0.5 % ophthalmic solution ketorolac  0.5 % eye drops    [provider]  levocetirizine (XYZAL ) 5 MG tablet TAKE ONE TABLET BY MOUTH EVERY EVENING 10/22/21   Susanna Epley, FNP  Menthol-Methyl Salicylate (MUSCLE RUB) 10-15 % CREA muscle rub    [provider]  Multiple Vitamins-Minerals (COMPLETE SENIOR PO) 1 tablet daily.  06/06/18   [provider]  tamsulosin  (FLOMAX ) 0.4 MG CAPS capsule TAKE ONE CAPSULE BY MOUTH DAILY HALF HOUR FOLLOWING THE SAME MEAL DAILY 11/13/21   Susanna Epley, FNP  vitamin B-12 (CYANOCOBALAMIN ) 500 MCG tablet Take 500 mcg by mouth daily.    [provider]  vitamin C  (ASCORBIC ACID ) 500 MG tablet Take 500 mg daily by mouth.    [provider]   Physical Exam: Vitals:   03/22/24 1019 03/22/24 1030 03/22/24 1130 03/22/24 1253  BP: (!) 160/70 (!) 161/67 (!) 144/79  (!) 157/77  Pulse: (!) 57 (!) 54 75 71  Resp: 19 12 (!) 21 (!) 24  Temp: 98.1 F (36.7 C)   98 F (36.7 C)  TempSrc: Oral   Oral  SpO2: 100% 100% 97% 94%   General: Alert, oriented x3, resting comfortably in no acute distress HEENT: EOMI, oropharynx clear, moist mucous membranes, hearing intact Neck: Trachea midline and no gross thyromegaly Respiratory: Lungs clear to auscultation bilaterally with normal respiratory effort; no w/r/r Cardiovascular: Regular rate and rhythm w/o m/r/g Abdomen: Soft, nontender, nondistended. Positive bowel sounds MSK: No obvious joint deformities or swelling Skin: No obvious rashes or lesions Neurologic: Awake, alert, spontaneously moves all extremities, strength intact Psychiatric: Appropriate mood and affect, conversational and cooperative  Data Reviewed:  Lab Results  Component Value Date   WBC 6.3 03/22/2024   HGB 12.6 (L)  03/22/2024   HCT 37.0 (L) 03/22/2024   MCV 93.6 03/22/2024   PLT 166 03/22/2024   Lab Results  Component Value Date   GLUCOSE 103 (H) 03/22/2024   CALCIUM  9.5 03/22/2024   NA 145 03/22/2024   K 3.9 03/22/2024   CO2 22 03/22/2024   CL 112 (H) 03/22/2024   BUN 17 03/22/2024   CREATININE 1.20 03/22/2024   Lab Results  Component Value Date   ALT 15 03/22/2024   AST 21 03/22/2024   ALKPHOS 72 03/22/2024   BILITOT 0.7 03/22/2024   Lab Results  Component Value Date   INR 1.1 08/06/2023   INR 1.1 09/05/2022   INR 1.04 06/12/2018    Radiology: CT ANGIO HEAD NECK W WO CM W PERF (CODE STROKE) Result Date: 03/22/2024 CLINICAL DATA:  Stroke workup EXAM: CT ANGIOGRAPHY HEAD AND NECK CT PERFUSION BRAIN TECHNIQUE: Multidetector CT imaging of the head and neck was performed using the standard protocol during bolus administration of intravenous contrast. Multiplanar CT image reconstructions and MIPs were obtained to evaluate the vascular anatomy. Carotid stenosis measurements (when applicable) are obtained utilizing NASCET  criteria, using the distal internal carotid diameter as the denominator. Multiphase CT imaging of the brain was performed following IV bolus contrast injection. Subsequent parametric perfusion maps were calculated using RAPID software. RADIATION DOSE REDUCTION: This exam was performed according to the departmental dose-optimization program which includes automated exposure control, adjustment of the mA and/or kV according to patient size and/or use of iterative reconstruction technique. CONTRAST:  OMNIPAQUE  IOHEXOL  350 MG/ML SOLN COMPARISON:  Noncontrast head CT from earlier today FINDINGS: CTA NECK FINDINGS Aortic arch: Extensive atheromatous plaque, 2 vessel branching Right carotid system: Moderate atheromatous plaque at the bifurcation without flow reducing stenosis, ulceration, or beading Left carotid system: Moderate atheromatous plaque at the bifurcation without stenosis, ulceration, or beading. Vertebral arteries: No proximal subclavian stenosis. Calcified plaque at the right vertebral origin causing mild-to-moderate stenosis. Robust flow in the left vertebral artery which is dominant. Skeleton: No acute finding Other neck: Goiter with asymmetric heterogeneous enlargement of the left lobe pattern unchanged from 2023 CT of the neck. Low-density nodule with calcification in the right lower internal jugular vein attributed to nonocclusive, chronic thrombus. Upper chest: No acute finding Review of the MIP images confirms the above findings CTA HEAD FINDINGS Anterior circulation: No major branch occlusion, beading, or proximal flow reducing stenosis. Mild atheromatous calcification at the cavernous carotids. Posterior circulation: The vertebral arteries show atheromatous calcification. No flow reducing stenosis seen in the vertebral or basilar circulation. No branch occlusion. Venous sinuses: Unremarkable Anatomic variants: None significant Review of the MIP images confirms the above findings CT Brain  Perfusion Findings: CBF (<30%) Volume: 24mL-this has a early CT correlate. Perfusion (Tmax>6.0s) volume: 21mL Mismatch Volume: 15mL Case discussed with Dr. Lindzen at time of dictation. IMPRESSION: No emergent large vessel occlusion. 6 cc acute, completed infarct in the superior left parietal cortex. There is 15 cc of adjacent penumbra intervening this infarct and a pre-existing left parietal infarct which is chronic. The affected peripheral branch vessel is not visualized. Atherosclerosis without flow reducing stenosis or embolic source seen in the proximal vasculature. Electronically Signed   By: Ronnette Coke M.D.   On: 03/22/2024 11:24   CT HEAD CODE STROKE WO CONTRAST` Result Date: 03/22/2024 CLINICAL DATA:  Code stroke. Neuro deficit, concern for stroke, aphasia. EXAM: CT HEAD WITHOUT CONTRAST TECHNIQUE: Contiguous axial images were obtained from the base of the skull through the vertex  without intravenous contrast. RADIATION DOSE REDUCTION: This exam was performed according to the departmental dose-optimization program which includes automated exposure control, adjustment of the mA and/or kV according to patient size and/or use of iterative reconstruction technique. COMPARISON:  CT head 11/30/2020, MRI head 06/13/2018. FINDINGS: Brain: No acute intracranial hemorrhage. No CT evidence of acute infarct. Redemonstrated remote infarct involving the left temporal occipital cortex and subcortical white matter. Nonspecific hypoattenuation in the periventricular and subcortical white matter favored to reflect chronic microvascular ischemic changes. No edema, mass effect, or midline shift. The basilar cisterns are patent. Ventricles: Prominence of the ventricles suggesting underlying parenchymal volume loss. Vascular: Atherosclerotic calcifications of the carotid siphons and intracranial vertebral arteries. No hyperdense vessel. Skull: No acute or aggressive finding. Orbits: Orbits are symmetric. Sinuses: Mucous  retention cyst in the right maxillary sinus. Other: Mastoid air cells are clear. 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 ASPECTS [facility] Stroke Program Early CT Score) - Ganglionic level infarction (caudate, lentiform nuclei, internal capsule, insula, M1-M3 cortex): 7 - Supraganglionic infarction (M4-M6 cortex): 3 Total score (0-10 with 10 being normal): 10 IMPRESSION: 1. No CT evidence of acute intracranial abnormality. 2. Redemonstrated remote infarct involving the left temporal occipital lobes. 3. Mild chronic microvascular ischemic changes and mild parenchymal volume loss. 4. ASPECTS is 10 These results were communicated to Dr. Lindzen At 11:07 am on 03/22/2024 by text page via the [facility] messaging system. Electronically Signed   By: Denny Flack M.D.   On: 03/22/2024 11:07   Assessment and Plan: Dillinger Ruvalcaba is a 89 y.o. 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  #L parietal CVA c/b acute encephalopathy -SLP consulted; apprec recs -Neurology consulted; apprec recs -Continue pta atorvastatin  80mg  daily -Consider plavix  75mg  dialy iso acute stroke vs ASA 81mg  daily for secondary prevention, but will defer to Neuro -Consider MRI brain, but will defer to Neuro  #H/o DVT -PTA apixaban  5mg  BID  #H/o HTN -PTA bisoprolol , lasix , and amlodipine   #H/o BPH -PTA tamsulosin    Advance Care Planning:   Code Status: Full Code   Consults: Neurology  Family Communication: N/A  Severity of Illness: The appropriate patient status for this patient is INPATIENT. Inpatient status is judged to be reasonable and necessary in order to provide the required intensity of service to ensure the patient's safety. The patient's presenting symptoms, physical exam findings, and initial radiographic and laboratory data in the context of their chronic comorbidities is felt to place them  at high risk for further clinical deterioration. Furthermore, it is not anticipated that the patient will be medically stable for discharge from the hospital within 2 midnights of admission.   * I certify that at the point of admission it is my clinical judgment that the patient will require inpatient hospital care spanning beyond 2 midnights from the point of admission due to high intensity of service, high risk for further deterioration and high frequency of surveillance required.*  ------- I spent 55 minutes reviewing previous labs/notes, obtaining separate history at the bedside, counseling/discussing the treatment plan outlined above, ordering medications/tests, and performing clinical documentation.  Author: Arne Langdon, MD 03/22/2024 12:55 PM  For on call review [email or web address].

## 2024-03-22 NOTE — ED Notes (Signed)
 Neurology at bedside.

## 2024-03-22 NOTE — Consult Note (Signed)
 NEUROLOGY CONSULT NOTE   Date of service: March 22, 2024 Patient Name: Jesus Lam MRN:  [id number] DOB:  1933/06/17 Chief Complaint: "Difficulty speaking" Requesting Provider: Craige Dixon, *  History of Present Illness  Jesus Lam is a 89 y.o. male with a PMHx of anemia, BPH, bilateral cataracts, CVA, DVT, dyslipidemia, gout, HTN, Osteoarthritis, PAD, stasis dermatitis, DVT (on Eliquis ), stroke and Vitamin D  deficiency who presents to the ED with new onset of expressive and receptive aphasia. At baseline he has stuttering speech from his old stroke, but without any difficulty with speech comprehension or word finding deficits.   CT head, CTA head and neck with CTP: No emergent large vessel occlusion. 6 cc acute, completed infarct in the superior left parietal cortex. There is 15 cc of adjacent penumbra intervening this infarct and a pre-existing left parietal infarct which is chronic. The affected peripheral branch vessel is not visualized. Atherosclerosis without flow reducing stenosis or embolic source seen in the proximal vasculature.  LKW: 1930 on Sunday Modified rankin score: 0 IV Thrombolysis: No: Out of the TNK time window EVT: No: CTA negative for LVO  NIHSS components Score: Comment  1a Level of Conscious 0[x] 1[] 2[] 3[]     1b LOC Questions 0[x] 1[] 2[]      1c LOC Commands 0[x] 1[] 2[]      2 Best Gaze 0[x] 1[] 2[]      3 Visual 0[] 1[] 2[x] 3[]     4 Facial Palsy 0[x] 1[] 2[] 3[]     5a Motor Arm - left 0[] 1[x] 2[] 3[] 4[] UN[]   5b Motor Arm - Right 0[x] 1[] 2[] 3[] 4[] UN[]   6a Motor Leg - Left 0[x] 1[] 2[] 3[] 4[] UN[]   6b Motor Leg - Right 0[x] 1[] 2[] 3[] 4[] UN[]   7 Limb Ataxia 0[x] 1[] 2[] UN[]     8 Sensory 0[x] 1[] 2[] UN[]     9 Best Language 0[] 1[] 2[x] 3[]     10 Dysarthria 0[x] 1[] 2[] UN[]     11  Extinct. and Inattention 0[x]  1[]  2[]       TOTAL:   5      ROS  As per HPI.   Past History   Past Medical History:  Diagnosis  Date   Anemia    BPH (benign prostatic hyperplasia)    Cataracts, bilateral    CVA (cerebral vascular accident) (HCC)    DVT (deep venous thrombosis) (HCC)    dvt in left leg   Dyslipidemia    Gout    Gout    HTN (hypertension)    Left hip pain 03/15/2020   Left leg pain    Osteoarthritis    PAD (peripheral artery disease) (HCC)    Stasis dermatitis    Stroke (HCC)    Vitamin D  deficiency     Past Surgical History:  Procedure Laterality Date   ABDOMINAL AORTOGRAM N/A 01/13/2018   Procedure: ABDOMINAL AORTOGRAM;  Surgeon: Cody Das, MD;  Location: [facility];  Service: Cardiovascular;  Laterality: N/A;   CATARACT EXTRACTION, BILATERAL     LEFT HEART CATH AND CORONARY ANGIOGRAPHY N/A 01/13/2018   Procedure: LEFT HEART CATH AND CORONARY ANGIOGRAPHY;  Surgeon: Cody Das, MD;  Location: [facility];  Service: Cardiovascular;  Laterality: N/A;   LOWER EXTREMITY ANGIOGRAPHY N/A 01/13/2018   Procedure: LOWER EXTREMITY ANGIOGRAPHY;  Surgeon: Cody Das, MD;  Location: [facility];  Service: Cardiovascular;  Laterality: N/A;   LOWER EXTREMITY ANGIOGRAPHY Right 01/27/2018   Procedure: LOWER EXTREMITY ANGIOGRAPHY;  Surgeon: Knox Perl, MD;  Location: [facility];  Service: Cardiovascular;  Laterality: Right;   PERIPHERAL VASCULAR ATHERECTOMY Left 01/13/2018   Procedure: PERIPHERAL VASCULAR ATHERECTOMY;  Surgeon: Cody Das, MD;  Location: [facility];  Service: Cardiovascular;  Laterality: Left;  SFA WITH PTA DRUG COATED BALLOON   PERIPHERAL VASCULAR INTERVENTION  01/27/2018   Procedure: PERIPHERAL VASCULAR INTERVENTION;  Surgeon: Knox Perl, MD;  Location: [facility];  Service: Cardiovascular;;    Family History: Family History  Problem Relation Age of Onset   Cancer Mother    Cancer Father    Alcohol abuse Father    Breast cancer Daughter    Stroke Daughter    CVA Daughter     Social History  reports  that he quit smoking about 45 years ago. His smoking use included cigarettes. He started smoking about 45 years ago. He has a 0.1 pack-year smoking history. He has never used smokeless tobacco. He reports current alcohol use of about 6.0 - 7.0 standard drinks of alcohol per week. He reports current drug use. Drug: Marijuana.  Allergies  Allergen Reactions   Shellfish Allergy Other (See Comments)    Gout     Medications   Current Facility-Administered Medications:    0.9 %  sodium chloride  infusion, , Intravenous, PRN, Ghumman, Ramandeep, NP  Current Outpatient Medications:    amLODipine  (NORVASC ) 5 MG tablet, Take 1 tablet (5 mg total) by mouth daily., Disp: 180 tablet, Rfl: 3   apixaban  (ELIQUIS ) 5 MG TABS tablet, Take 1 tablet (5 mg total) by mouth 2 (two) times daily. Start taking after completion of starter pack., Disp: 60 tablet, Rfl: 5   atorvastatin  (LIPITOR ) 40 MG tablet, Take 2 tablets (80 mg total) by mouth daily., Disp: 180 tablet, Rfl: 1   azelastine  (ASTELIN ) 0.1 % nasal spray, Place 2 sprays into both nostrils 2 (two) times daily. Use in each nostril as directed, Disp: 30 mL, Rfl: 5   bisoprolol  (ZEBETA ) 5 MG tablet, Take 0.5 tablets (2.5 mg total) by mouth daily., Disp: 60 tablet, Rfl: 2   Cholecalciferol  (VITAMIN D3) 5000 units CAPS, Take 5,000 Units by mouth daily., Disp: , Rfl:    diclofenac  Sodium (VOLTAREN ) 1 % GEL, APPLY TWO GRAMS TOPICALLY FOUR TIMES A DAY, Disp: 100 g, Rfl: 1   donepezil  (ARICEPT ) 10 MG tablet, TAKE 1 TABLET AT BEDTIME, Disp: 90 tablet, Rfl: 3   Elastic Bandages & Supports (MEDICAL COMPRESSION STOCKINGS) MISC, , Disp: , Rfl:    furosemide  (LASIX ) 20 MG tablet, Take 20 mg by mouth daily., Disp: , Rfl:    gabapentin  (NEURONTIN ) 300 MG capsule, TAKE ONE CAPSULE BY MOUTH AT BEDTIME, Disp: 90 capsule, Rfl: 0   GNP 8 HOUR ARTHRITIS RELIEF 650 MG CR tablet, TAKE ONE TABLET BY MOUTH TWICE A DAY, Disp: 60 tablet, Rfl: 3   hydrALAZINE  (APRESOLINE ) 50 MG tablet,  Take 1 tablet (50 mg total) by mouth 3 (three) times daily., Disp: 270 tablet, Rfl: 3   ketorolac  (ACULAR ) 0.5 % ophthalmic solution, ketorolac  0.5 % eye drops, Disp: , Rfl:    levocetirizine (XYZAL ) 5 MG tablet, TAKE ONE TABLET BY MOUTH EVERY EVENING, Disp: 90 tablet, Rfl: 4   Menthol-Methyl Salicylate (MUSCLE RUB) 10-15 % CREA, muscle rub, Disp: , Rfl:    Multiple Vitamins-Minerals (COMPLETE SENIOR PO), 1 tablet daily. , Disp: ,  Rfl:    tamsulosin  (FLOMAX ) 0.4 MG CAPS capsule, TAKE ONE CAPSULE BY MOUTH DAILY HALF HOUR FOLLOWING THE SAME MEAL DAILY, Disp: 90 capsule, Rfl: 4   vitamin B-12 (CYANOCOBALAMIN ) 500 MCG tablet, Take 500 mcg by mouth daily., Disp: , Rfl:    vitamin C  (ASCORBIC ACID ) 500 MG tablet, Take 500 mg daily by mouth., Disp: , Rfl:   Vitals   Vitals:   2024-04-20 1019 2024-04-20 1030  BP: (!) 160/70 (!) 161/67  Pulse: (!) 57 (!) 54  Resp: 19 12  Temp: 98.1 F (36.7 C)   TempSrc: Oral   SpO2: 100% 100%    There is no height or weight on file to calculate BMI.  Physical Exam   Physical Exam  HEENT:  [place]/AT. No nuchal rigidity.  Lungs: Respirations unlabored Extremities: Warm and well perfused.   Neurological Examination Mental Status: Awake and alert. Speech is dysfluent with a stuttering quality (baseline) that worsens with attempts to find words when answering questions. Severe naming deficit with phonemic paraphasias noted, as well as some perseveration. Comprehension impaired - can follow only about 50% of verbal commands requiring pantomiming and repetition for completion. Able to answer some orientation questions: States his correct age and the month. Unable to give the correct year. Unable to identify the city or state he is in. No dysarthria.   Cranial Nerves: II: LEFT sided visual field cut when testing blink to threat. Right pupil 3 mm and reactive to light. Left pupil 4 mm and irregular, sluggishly reactive to light. Corneal opacities on the left.    III,IV, VI:  No ptosis. EOMI. No nystagmus.  V: Blinks to eyelid stimulation bilaterally VII: Smile symmetric VIII: Hearing intact to voice IX,X: No hoarseness or hypophonia XI: Symmetric XII: Midline tongue  Motor: Right : Upper extremity   5/5    Left:     Upper extremity   4+/5  Lower extremity   5/5     Lower extremity   5/5 Subtle left arm drift, but orbiting hands test is positive on the left.  Sensory: Gross touch intact throughout, bilaterally Deep Tendon Reflexes: 1+ and symmetric throughout Cerebellar: No ataxia with FNF bilaterally Gait: Deferred   Labs/Imaging/Neurodiagnostic studies   CBC:  Recent Labs  Lab 20-Apr-2024 1033 2024-04-20 1039  WBC 6.3  --   NEUTROABS 3.2  --   HGB 11.7* 12.6*  HCT 36.8* 37.0*  MCV 93.6  --   PLT 166  --    Basic Metabolic Panel:  Lab Results  Component Value Date   NA 145 Apr 20, 2024   K 3.9 20-Apr-2024   CO2 23 09/17/2023   GLUCOSE 103 (H) Apr 20, 2024   BUN 17 20-Apr-2024   CREATININE 1.20 Apr 20, 2024   CALCIUM  9.3 09/17/2023   GFRNONAA >60 08/06/2023   GFRAA 64 08/03/2020   Lipid Panel:  Lab Results  Component Value Date   LDLCALC 40 11/03/2023   HgbA1c:  Lab Results  Component Value Date   HGBA1C 5.4 12/24/2021      ASSESSMENT  89 year old male with a PMHx of anemia, BPH, bilateral cataracts, CVA, DVT, dyslipidemia, gout, HTN, Osteoarthritis, PAD, stasis dermatitis, DVT (on Eliquis ), stroke and Vitamin D  deficiency who presents to the ED with new onset of expressive and receptive aphasia. At baseline he has stuttering speech from his old stroke, but without any difficulty with speech comprehension or word finding deficits.  - Exam reveals expressive and receptive aphasia. LUE with 4+/5 strength. NIHSS 5.  -  CT head, CTA head and neck with CTP: No emergent large vessel occlusion. 6 cc acute, completed infarct in the superior left parietal cortex. There is 15 cc of adjacent penumbra intervening this infarct and a pre-existing left  parietal infarct which is chronic. The affected peripheral branch vessel is not visualized. Atherosclerosis without flow reducing stenosis or embolic source seen in the proximal vasculature. - Impression: Acute left distal MCA branch ischemic infarction.  - Not a TNK candidate due to time criteria. Not a candidate for thrombectomy due to no LVO on CTA.   RECOMMENDATIONS  1. HgbA1c, fasting lipid panel 2. MRI of the brain without contrast 3. PT consult, OT consult, Speech consult 4. Echocardiogram 5. Continue atorvastatin  6. Continue home Eliquis . Risk of stroke recurrence over the next several days outweighs the risks of hemorrhagic conversion.   7. Risk factor modification 8. Telemetry monitoring 9. Frequent neuro checks 10. NPO until passes stroke swallow screen 11. Modified permissive HTN protocol until 1930 today. Treat SBP if > 180 until then. After 1930 lower BP by 20% per day until at goal SBP of 120-140.  12. Stroke Team to follow.    ______________________________________________________________________    Hope Ly, Ludwin Flahive, MD [facility]

## 2024-03-23 ENCOUNTER — Other Ambulatory Visit: Payer: Self-pay | Admitting: Nurse Practitioner

## 2024-03-23 ENCOUNTER — Observation Stay (HOSPITAL_COMMUNITY)

## 2024-03-23 ENCOUNTER — Inpatient Hospital Stay (HOSPITAL_COMMUNITY)

## 2024-03-23 ENCOUNTER — Other Ambulatory Visit: Payer: Self-pay | Admitting: Cardiology

## 2024-03-23 DIAGNOSIS — I639 Cerebral infarction, unspecified: Secondary | ICD-10-CM | POA: Diagnosis present

## 2024-03-23 DIAGNOSIS — Z91148 Patient's other noncompliance with medication regimen for other reason: Secondary | ICD-10-CM

## 2024-03-23 DIAGNOSIS — I5022 Chronic systolic (congestive) heart failure: Secondary | ICD-10-CM

## 2024-03-23 DIAGNOSIS — T45516A Underdosing of anticoagulants, initial encounter: Secondary | ICD-10-CM | POA: Diagnosis present

## 2024-03-23 DIAGNOSIS — E785 Hyperlipidemia, unspecified: Secondary | ICD-10-CM

## 2024-03-23 DIAGNOSIS — R4701 Aphasia: Secondary | ICD-10-CM

## 2024-03-23 DIAGNOSIS — I455 Other specified heart block: Secondary | ICD-10-CM | POA: Diagnosis not present

## 2024-03-23 DIAGNOSIS — G9341 Metabolic encephalopathy: Secondary | ICD-10-CM | POA: Diagnosis not present

## 2024-03-23 DIAGNOSIS — I110 Hypertensive heart disease with heart failure: Secondary | ICD-10-CM | POA: Diagnosis not present

## 2024-03-23 DIAGNOSIS — N400 Enlarged prostate without lower urinary tract symptoms: Secondary | ICD-10-CM | POA: Diagnosis present

## 2024-03-23 DIAGNOSIS — I63412 Cerebral infarction due to embolism of left middle cerebral artery: Secondary | ICD-10-CM | POA: Diagnosis not present

## 2024-03-23 DIAGNOSIS — I63512 Cerebral infarction due to unspecified occlusion or stenosis of left middle cerebral artery: Secondary | ICD-10-CM | POA: Diagnosis not present

## 2024-03-23 DIAGNOSIS — E0590 Thyrotoxicosis, unspecified without thyrotoxic crisis or storm: Secondary | ICD-10-CM

## 2024-03-23 DIAGNOSIS — M19041 Primary osteoarthritis, right hand: Secondary | ICD-10-CM

## 2024-03-23 DIAGNOSIS — I2510 Atherosclerotic heart disease of native coronary artery without angina pectoris: Secondary | ICD-10-CM | POA: Diagnosis present

## 2024-03-23 DIAGNOSIS — Z87891 Personal history of nicotine dependence: Secondary | ICD-10-CM | POA: Diagnosis not present

## 2024-03-23 DIAGNOSIS — I429 Cardiomyopathy, unspecified: Secondary | ICD-10-CM

## 2024-03-23 DIAGNOSIS — R001 Bradycardia, unspecified: Secondary | ICD-10-CM | POA: Diagnosis not present

## 2024-03-23 DIAGNOSIS — Z6832 Body mass index (BMI) 32.0-32.9, adult: Secondary | ICD-10-CM | POA: Diagnosis not present

## 2024-03-23 DIAGNOSIS — I63441 Cerebral infarction due to embolism of right cerebellar artery: Secondary | ICD-10-CM | POA: Diagnosis not present

## 2024-03-23 DIAGNOSIS — I428 Other cardiomyopathies: Secondary | ICD-10-CM | POA: Diagnosis not present

## 2024-03-23 DIAGNOSIS — I619 Nontraumatic intracerebral hemorrhage, unspecified: Secondary | ICD-10-CM | POA: Diagnosis not present

## 2024-03-23 DIAGNOSIS — I440 Atrioventricular block, first degree: Secondary | ICD-10-CM | POA: Diagnosis present

## 2024-03-23 DIAGNOSIS — F03A0 Unspecified dementia, mild, without behavioral disturbance, psychotic disturbance, mood disturbance, and anxiety: Secondary | ICD-10-CM | POA: Diagnosis not present

## 2024-03-23 DIAGNOSIS — I10 Essential (primary) hypertension: Secondary | ICD-10-CM

## 2024-03-23 DIAGNOSIS — G319 Degenerative disease of nervous system, unspecified: Secondary | ICD-10-CM | POA: Diagnosis not present

## 2024-03-23 DIAGNOSIS — Z79899 Other long term (current) drug therapy: Secondary | ICD-10-CM | POA: Diagnosis not present

## 2024-03-23 DIAGNOSIS — Z7901 Long term (current) use of anticoagulants: Secondary | ICD-10-CM | POA: Diagnosis not present

## 2024-03-23 DIAGNOSIS — G9389 Other specified disorders of brain: Secondary | ICD-10-CM | POA: Diagnosis not present

## 2024-03-23 DIAGNOSIS — I739 Peripheral vascular disease, unspecified: Secondary | ICD-10-CM | POA: Diagnosis not present

## 2024-03-23 DIAGNOSIS — F0390 Unspecified dementia without behavioral disturbance: Secondary | ICD-10-CM | POA: Diagnosis not present

## 2024-03-23 DIAGNOSIS — I6389 Other cerebral infarction: Secondary | ICD-10-CM | POA: Diagnosis not present

## 2024-03-23 DIAGNOSIS — E66811 Obesity, class 1: Secondary | ICD-10-CM | POA: Diagnosis present

## 2024-03-23 DIAGNOSIS — I69323 Fluency disorder following cerebral infarction: Secondary | ICD-10-CM | POA: Diagnosis not present

## 2024-03-23 DIAGNOSIS — R29705 NIHSS score 5: Secondary | ICD-10-CM | POA: Diagnosis present

## 2024-03-23 LAB — HEMOGLOBIN A1C
Hgb A1c MFr Bld: 4.8 % (ref 4.8–5.6)
Mean Plasma Glucose: 91.06 mg/dL

## 2024-03-23 LAB — LIPID PANEL
Cholesterol: 94 mg/dL (ref 0–200)
HDL: 23 mg/dL — ABNORMAL LOW (ref >40)
LDL Cholesterol: 49 mg/dL (ref 0–99)
Total CHOL/HDL Ratio: 4.1 ratio
Triglycerides: 112 mg/dL (ref <150)
VLDL: 22 mg/dL (ref 0–40)

## 2024-03-23 LAB — T4, FREE: Free T4: 1.3 ng/dL — ABNORMAL HIGH (ref 0.61–1.12)

## 2024-03-23 LAB — TSH: TSH: <0.01 u[IU]/mL — ABNORMAL LOW (ref 0.350–4.500)

## 2024-03-23 MED ORDER — ACETAMINOPHEN 325 MG PO TABS: 650.0000 mg | ORAL_TABLET | Freq: Four times a day (QID) | ORAL | Status: DC | PRN

## 2024-03-23 MED ORDER — ONDANSETRON HCL 4 MG/2ML IJ SOLN: 4.0000 mg | Freq: Four times a day (QID) | INTRAMUSCULAR | Status: DC | PRN

## 2024-03-23 MED ORDER — LORAZEPAM 2 MG/ML IJ SOLN
1.0000 mg | Freq: Once | INTRAMUSCULAR | Status: AC | PRN
  Administered 2024-03-23: 1 mg via INTRAVENOUS
  Filled 2024-03-23: qty 1

## 2024-03-23 MED ORDER — METHIMAZOLE 5 MG PO TABS
5.0000 mg | ORAL_TABLET | Freq: Two times a day (BID) | ORAL | Status: DC
  Administered 2024-03-23 – 2024-03-26 (×7): 5 mg via ORAL
  Filled 2024-03-23 (×7): qty 1

## 2024-03-23 MED ORDER — STROKE: EARLY STAGES OF RECOVERY BOOK
Freq: Once | Status: AC
  Filled 2024-03-23: qty 1

## 2024-03-23 MED ORDER — IPRATROPIUM-ALBUTEROL 0.5-2.5 (3) MG/3ML IN SOLN: 3.0000 mL | RESPIRATORY_TRACT | Status: DC | PRN

## 2024-03-23 NOTE — Progress Notes (Signed)
 STROKE TEAM PROGRESS NOTE   SUBJECTIVE (INTERVAL HISTORY) His daughter is at the bedside.  Overall his condition is gradually improving.  He still has some mild expressive aphasia but follows all commands.  Per daughter and patient that he had medication pouches and taking at home by himself.  However, daughter found that patient spilled medication all over with all the pouches open.  Not sure whether patient compliant with Eliquis .   OBJECTIVE Temp:  [98 F (36.7 C)-98.4 F (36.9 C)] 98.1 F (36.7 C) (04/29 0407) Pulse Rate:  [59-69] 65 (04/29 0420) Cardiac Rhythm: Heart block;Bundle branch block (04/29 0700) Resp:  [17-22] 20 (04/29 0420) BP: (132-189)/(55-97) 140/74 (04/29 0420) SpO2:  [94 %-97 %] 97 % (04/29 0407)  Recent Labs  Lab 03/22/24 1025  GLUCAP 94   Recent Labs  Lab 03/22/24 1033 03/22/24 1039  NA 142 145  K 3.9 3.9  CL 110 112*  CO2 22  --   GLUCOSE 106* 103*  BUN 16 17  CREATININE 1.15 1.20  CALCIUM  9.5  --    Recent Labs  Lab 03/22/24 1033  AST 21  ALT 15  ALKPHOS 72  BILITOT 0.7  PROT 7.6  ALBUMIN 3.3*   Recent Labs  Lab 03/22/24 1033 03/22/24 1039  WBC 6.3  --   NEUTROABS 3.2  --   HGB 11.7* 12.6*  HCT 36.8* 37.0*  MCV 93.6  --   PLT 166  --    No results for input(s): "CKTOTAL", "CKMB", "CKMBINDEX", "TROPONINI" in the last 168 hours. No results for input(s): "LABPROT", "INR" in the last 72 hours. No results for input(s): "COLORURINE", "LABSPEC", "PHURINE", "GLUCOSEU", "HGBUR", "BILIRUBINUR", "KETONESUR", "PROTEINUR", "UROBILINOGEN", "NITRITE", "LEUKOCYTESUR" in the last 72 hours.  Invalid input(s): "APPERANCEUR"     Component Value Date/Time   CHOL 94 03/23/2024 0836   CHOL 99 (L) 11/03/2023 1549   TRIG 112 03/23/2024 0836   HDL 23 (L) 03/23/2024 0836   HDL 29 (L) 11/03/2023 1549   CHOLHDL 4.1 03/23/2024 0836   VLDL 22 03/23/2024 0836   LDLCALC 49 03/23/2024 0836   LDLCALC 40 11/03/2023 1549   Lab Results  Component Value  Date   HGBA1C 4.8 03/23/2024      Component Value Date/Time   LABOPIA NONE DETECTED 07/28/2022 0013   COCAINSCRNUR NONE DETECTED 07/28/2022 0013   LABBENZ NONE DETECTED 07/28/2022 0013   AMPHETMU NONE DETECTED 07/28/2022 0013   THCU POSITIVE (A) 07/28/2022 0013   LABBARB NONE DETECTED 07/28/2022 0013    No results for input(s): "ETH" in the last 168 hours.  I have personally reviewed the radiological images below and agree with the radiology interpretations.  CT ANGIO HEAD NECK W WO CM W PERF (CODE STROKE) Result Date: 03/22/2024 CLINICAL DATA:  Stroke workup EXAM: CT ANGIOGRAPHY HEAD AND NECK CT PERFUSION BRAIN TECHNIQUE: Multidetector CT imaging of the head and neck was performed using the standard protocol during bolus administration of intravenous contrast. Multiplanar CT image reconstructions and MIPs were obtained to evaluate the vascular anatomy. Carotid stenosis measurements (when applicable) are obtained utilizing NASCET criteria, using the distal internal carotid diameter as the denominator. Multiphase CT imaging of the brain was performed following IV bolus contrast injection. Subsequent parametric perfusion maps were calculated using RAPID software. RADIATION DOSE REDUCTION: This exam was performed according to the departmental dose-optimization program which includes automated exposure control, adjustment of the mA and/or kV according to patient size and/or use of iterative reconstruction technique. CONTRAST:  OMNIPAQUE  IOHEXOL  350  MG/ML SOLN COMPARISON:  Noncontrast head CT from earlier today FINDINGS: CTA NECK FINDINGS Aortic arch: Extensive atheromatous plaque, 2 vessel branching Right carotid system: Moderate atheromatous plaque at the bifurcation without flow reducing stenosis, ulceration, or beading Left carotid system: Moderate atheromatous plaque at the bifurcation without stenosis, ulceration, or beading. Vertebral arteries: No proximal subclavian stenosis. Calcified  plaque at the right vertebral origin causing mild-to-moderate stenosis. Robust flow in the left vertebral artery which is dominant. Skeleton: No acute finding Other neck: Goiter with asymmetric heterogeneous enlargement of the left lobe pattern unchanged from 2023 CT of the neck. Low-density nodule with calcification in the right lower internal jugular vein attributed to nonocclusive, chronic thrombus. Upper chest: No acute finding Review of the MIP images confirms the above findings CTA HEAD FINDINGS Anterior circulation: No major branch occlusion, beading, or proximal flow reducing stenosis. Mild atheromatous calcification at the cavernous carotids. Posterior circulation: The vertebral arteries show atheromatous calcification. No flow reducing stenosis seen in the vertebral or basilar circulation. No branch occlusion. Venous sinuses: Unremarkable Anatomic variants: None significant Review of the MIP images confirms the above findings CT Brain Perfusion Findings: CBF (<30%) Volume: 63mL-this has a early CT correlate. Perfusion (Tmax>6.0s) volume: 21mL Mismatch Volume: 15mL Case discussed with Dr. Lindzen at time of dictation. IMPRESSION: No emergent large vessel occlusion. 6 cc acute, completed infarct in the superior left parietal cortex. There is 15 cc of adjacent penumbra intervening this infarct and a pre-existing left parietal infarct which is chronic. The affected peripheral branch vessel is not visualized. Atherosclerosis without flow reducing stenosis or embolic source seen in the proximal vasculature. Electronically Signed   By: Ronnette Coke M.D.   On: 03/22/2024 11:24   CT HEAD CODE STROKE WO CONTRAST` Result Date: 03/22/2024 CLINICAL DATA:  Code stroke. Neuro deficit, concern for stroke, aphasia. EXAM: CT HEAD WITHOUT CONTRAST TECHNIQUE: Contiguous axial images were obtained from the base of the skull through the vertex without intravenous contrast. RADIATION DOSE REDUCTION: This exam was performed  according to the departmental dose-optimization program which includes automated exposure control, adjustment of the mA and/or kV according to patient size and/or use of iterative reconstruction technique. COMPARISON:  CT head 11/30/2020, MRI head 06/13/2018. FINDINGS: Brain: No acute intracranial hemorrhage. No CT evidence of acute infarct. Redemonstrated remote infarct involving the left temporal occipital cortex and subcortical white matter. Nonspecific hypoattenuation in the periventricular and subcortical white matter favored to reflect chronic microvascular ischemic changes. No edema, mass effect, or midline shift. The basilar cisterns are patent. Ventricles: Prominence of the ventricles suggesting underlying parenchymal volume loss. Vascular: Atherosclerotic calcifications of the carotid siphons and intracranial vertebral arteries. No hyperdense vessel. Skull: No acute or aggressive finding. Orbits: Orbits are symmetric. Sinuses: Mucous retention cyst in the right maxillary sinus. Other: Mastoid air cells are clear. 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 ASPECTS [facility] Stroke Program Early CT Score) - Ganglionic level infarction (caudate, lentiform nuclei, internal capsule, insula, M1-M3 cortex): 7 - Supraganglionic infarction (M4-M6 cortex): 3 Total score (0-10 with 10 being normal): 10 IMPRESSION: 1. No CT evidence of acute intracranial abnormality. 2. Redemonstrated remote infarct involving the left temporal occipital lobes. 3. Mild chronic microvascular ischemic changes and mild parenchymal volume loss. 4. ASPECTS is 10 These results were communicated to Dr. Lindzen At 11:07 am on 03/22/2024 by text page via the [facility] messaging system. Electronically Signed   By: Denny Flack M.D.   On: 03/22/2024 11:07     PHYSICAL EXAM  Temp:  [98 F (36.7 C)-98.4 F (36.9 C)] 98.1 F (36.7 C) (04/29 0407) Pulse Rate:  [59-69] 65 (04/29 0420) Resp:  [17-22] 20 (04/29 0420) BP:  (132-189)/(55-97) 140/74 (04/29 0420) SpO2:  [94 %-97 %] 97 % (04/29 0407)  General - Well nourished, well developed, in no apparent distress.  Ophthalmologic - fundi not visualized due to noncooperation.  Cardiovascular - Regular rhythm and rate.  Neuro - awake, alert, eyes open, mild expressive aphasia, difficulty finding words for orientation questions but able to nod to correct answers but not orientated to time, following all simple commands. Able to name 3/6 and repeat part of the sentences. No gaze palsy, tracking bilaterally, visual field full, PERRL. No significant facial droop. Tongue midline. Bilateral UEs 5/5, no drift. Bilaterally LEs 4/5, no drift. Sensation symmetrical bilaterally, b/l FTN intact but slow, gait not tested.     ASSESSMENT/PLAN [name] is a 89 y.o. male with history of DVT on Eliquis , hypertension, hyperlipidemia, PAD, dementia, stroke admitted for aphasia. No TNK given due to outside window.    Stroke: Left MCA infarct, likely due to noncompliant with anticoagulation CT no acute abnormality, old left temporoparietal infarcts CT head and neck unremarkable CTP 6/21cc MRI pending 2D Echo  pending LDL 49 HgbA1c 4.8 Eliquis  for VTE prophylaxis Eliquis  (likely noncompliant) prior to admission, now on Eliquis  5 mg twice daily.  Patient counseled to be compliant with his antithrombotic medications Ongoing aggressive stroke risk factor management Therapy recommendations: CIR Disposition: Pending  History of stroke 05/2018 admitted for aphasia, found to have left parietal occipital infarct on MRI.  CT negative.  MRA and carotid Doppler negative.  EF 20 to 25%.  LDL 86, A1c 5.3.  UDS positive for THC.  Discharged on DAPT and Lipitor  80.  With residual stuttering speech.  Cardiomyopathy EF 20 to 25% in 05/2018 This admission 2D echo pending Follow-up outpatient with Dr. Filiberto Hug  Hyperthyroidism TSH < 0.010 Free T4 = 1.3 Management per primary  team  Hypertension Stable On home zebeta , Lasix  and Norvasc  Avoid low BP Long term BP goal normotensive  Hyperlipidemia Home meds: Lipitor  80 LDL 49, goal < 70 Now on home Lipitor  80 Continue statin at discharge  Other Stroke Risk Factors Advanced age PAD History of DVT in 08/2023, on Eliquis  at home  Other Active Problems Dementia on Aricept  BPH on Flomax   Hospital day # 0    Consuelo Denmark, MD PhD Stroke Neurology 03/23/2024 7:08 PM    To contact Stroke Continuity provider, please refer to [email or web address]. After hours, contact General Neurology

## 2024-03-23 NOTE — Progress Notes (Signed)
  Inpatient Rehab Admissions Coordinator :  Per therapy recommendations, patient was screened for CIR candidacy by Ottie Glazier RN MSN.  At this time patient appears to be a potential candidate for CIR. I will place a rehab consult per protocol for full assessment. Please call me with any questions.  Ottie Glazier RN MSN Admissions Coordinator [phone]

## 2024-03-23 NOTE — Care Management Important Message (Signed)
 Important Message  Patient Details  Name: Jesus Lam MRN: [id number] Date of Birth: 1933-03-29   Important Message Given:        Wynonia Hedges 03/23/2024, 10:07 AM

## 2024-03-23 NOTE — TOC Initial Note (Signed)
 Transition of Care [facility]) - Initial/Assessment Note    Patient Details  Name: Jesus Lam MRN: [id number] Date of Birth: [birth year]  Transition of Care [facility]) CM/SW Contact:    Octavian Godek E Siboney Requejo, LCSW Phone Number: 03/23/2024, 11:34 AM  Clinical Narrative:                 CSW met with patient and daughter Eldonna Greenspan) at bedside. Patient states he has another daughter Leward Record) who would be his main point of contact. Patient lives alone. Daughters are involved and supportive. Patient has a cane and rollator at home. PCP is Susanna Epley. Leward Record provides transportation. Patient is aware of rec for CIR and is agreeable - patient and Eldonna Greenspan were also agreeable to SNF STR as a back up plan if CIR cannot take him.  Expected Discharge Plan: IP Rehab Facility Barriers to Discharge: Continued Medical Work up   Patient Goals and CMS Choice Patient states their goals for this hospitalization and ongoing recovery are:: rehab CMS Medicare.gov Compare Post Acute Care list provided to:: Patient Choice offered to / list presented to : Patient, Adult Children      Expected Discharge Plan and Services       Living arrangements for the past 2 months: Single Family Home                                      Prior Living Arrangements/Services Living arrangements for the past 2 months: Single Family Home Lives with:: Self Patient language and need for interpreter reviewed:: Yes Do you feel safe going back to the place where you live?: Yes      Need for Family Participation in Patient Care: Yes (Comment) Care giver support system in place?: Yes (comment) Current home services: DME Criminal Activity/Legal Involvement Pertinent to Current Situation/Hospitalization: No - Comment as needed  Activities of Daily Living   ADL Screening (condition at time of admission) Independently performs ADLs?: Yes (appropriate for developmental age) Is the patient deaf or have difficulty hearing?: No Does  the patient have difficulty seeing, even when wearing glasses/contacts?: No Does the patient have difficulty concentrating, remembering, or making decisions?: No  Permission Sought/Granted Permission sought to share information with : Magazine features editor, Family Supports Permission granted to share information with : Yes, Verbal Permission Granted     Permission granted to share info w AGENCY: CIR and SNFs  Permission granted to share info w Relationship: Leward Record - daughter     Emotional Assessment       Orientation: : Oriented to Self, Oriented to Place, Oriented to  Time, Oriented to Situation Alcohol / Substance Use: Not Applicable Psych Involvement: No (comment)  Admission diagnosis:  Aphasia [R47.01] CVA (cerebrovascular accident due to intracerebral hemorrhage) (HCC) [I61.9] Cerebrovascular accident (CVA), unspecified mechanism (HCC) [I63.9] Patient Active Problem List   Diagnosis Date Noted   CVA (cerebrovascular accident due to intracerebral hemorrhage) (HCC) 03/22/2024   Lower abdominal pain 11/03/2023   Need for influenza vaccination 11/03/2023   Difficult or painful urination 11/03/2023   Deep venous thrombosis (DVT) of left peroneal vein (HCC) 09/24/2023   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 stage 3a chronic kidney disease (HCC) 08/26/2023   Leg swelling 08/25/2023   Benign hypertension without CHF 06/17/2023   Class 1 obesity due to excess calories with serious comorbidity and body mass index (BMI) of 32.0 to 32.9 in adult 06/17/2023  Does use hearing aid 06/17/2023   Urine leukocytes 06/17/2023   Bilateral hearing loss 06/17/2023   Dysuria 06/17/2023   Stage 3 chronic kidney disease, unspecified whether stage 3a or 3b CKD (HCC) 01/20/2023   Hyperthyroidism 01/17/2023   Bilateral impacted cerumen 11/01/2021   Presbycusis of both ears 11/01/2021   Leg edema 08/01/2021   Mild cognitive impairment 09/07/2020   Chronic  pain of left knee 03/15/2020   Pure hypercholesterolemia 11/15/2019   First degree AV block 07/28/2019   Hypertriglyceridemia 04/29/2019   Mixed hyperlipidemia 04/29/2019   CVA (cerebral vascular accident) [facility])    Bradycardia    Subclinical hyperthyroidism 11/18/2018   PAD (peripheral artery disease) (HCC) 06/13/2018   Ischemic cerebrovascular accident (CVA) (HCC) 06/13/2018   Dysarthria 06/12/2018   Renal insufficiency 06/12/2018   Claudication (HCC) 01/11/2018   Anemia 10/10/2017   PCP:  Susanna Epley, FNP Pharmacy:   [facility] - [place], [place] - [address] [place] [address] Phone: [phone] Fax: [phone]  [facility] - [place], [place] - [address] [place] [address] Phone: [phone] Fax: [phone]  [facility] [address] [place] [address] Phone: [phone] Fax: [phone]     Social Drivers of Health (SDOH) Social History: SDOH Screenings   Food Insecurity: No Food Insecurity (03/22/2024)  Housing: Low Risk  (03/22/2024)  Transportation Needs: No Transportation Needs (03/22/2024)  Utilities: Not At Risk (03/22/2024)  Alcohol Screen: Low Risk  (06/04/2023)  Depression (PHQ2-9): Low Risk  (08/21/2023)  Financial Resource Strain: Low Risk  (06/04/2023)  Physical Activity: Sufficiently Active (06/04/2023)  Recent Concern: Physical Activity - Insufficiently Active (06/04/2023)  Social Connections: Moderately Integrated (03/22/2024)  Stress: No Stress Concern Present (06/04/2023)  Tobacco Use: Medium Risk (03/22/2024)   SDOH Interventions:     Readmission Risk Interventions     No data to display

## 2024-03-23 NOTE — Evaluation (Signed)
 Physical Therapy Evaluation Patient Details Name: Jesus Lam MRN: [id number] DOB: 07/08/1933 Today's Date: 03/23/2024  History of Present Illness  Patient is a 89 y.o.  male admitted 4/28 presented with new onset expressive aphasia.   Left superior parietal infarct.  PMH: HTN, HLD, VTE on Eliquis , CVA 2019  Clinical Impression  Pt admitted with above diagnosis. Pt was able to walk a short distance in room with RW with mod assist and mod cues. Right hemibody uncoordinated as well as question regarding inattention to right side.  Pt will benefit from post acute rehab > 3hours day at d/c. STates his daughter can come and assist him.  He had a PCA 2-3 hours day PTA as well.  Will follow acutely and progress pt as able.   Pt currently with functional limitations due to the deficits listed below (see PT Problem List). Pt will benefit from acute skilled PT to increase their independence and safety with mobility to allow discharge.           If plan is discharge home, recommend the following: Assistance with cooking/housework;Assist for transportation;Help with stairs or ramp for entrance;Supervision due to cognitive status   Can travel by private vehicle        Equipment Recommendations Other (comment) (TBA at next venue)  Recommendations for Other Services  Rehab consult    Functional Status Assessment Patient has had a recent decline in their functional status and demonstrates the ability to make significant improvements in function in a reasonable and predictable amount of time.     Precautions / Restrictions Precautions Precautions: Fall Restrictions Weight Bearing Restrictions Per Provider Order: No      Mobility  Bed Mobility Overal bed mobility: Needs Assistance Bed Mobility: Supine to Sit     Supine to sit: Contact guard     General bed mobility comments: Cues and guidance but no physical assist. Noted decr coordination of right hemibody with pt using momentum.     Transfers Overall transfer level: Needs assistance Equipment used: Rolling walker (2 wheels) Transfers: Sit to/from Stand, Bed to chair/wheelchair/BSC Sit to Stand: Min assist, From elevated surface           General transfer comment: Pt was able to stand to RW wtih min assist and cues. Pt needing cues to place right UE on RW as he remains with decr awarness of right UE.    Ambulation/Gait Ambulation/Gait assistance: Mod assist Gait Distance (Feet): 35 Feet Assistive device: Rolling walker (2 wheels) Gait Pattern/deviations: Step-through pattern, Decreased step length - right, Decreased stride length, Ataxic, Drifts right/left, Trunk flexed, Wide base of support   Gait velocity interpretation: <1.31 ft/sec, indicative of household ambulator   General Gait Details: Pt with uncoordinated gait overall with assist and cues for safety. Pt needed assist and cues to move RW, to stay close to RW and for sequencing steps and RW. Pt with very poor safety awareness needing constant cues and assist. Pt with poor coordination of right LE and constant cues to hold right UE on RW.  Stairs            Wheelchair Mobility     Tilt Bed    Modified Rankin (Stroke Patients Only) Modified Rankin (Stroke Patients Only) Pre-Morbid Rankin Score: No symptoms Modified Rankin: Moderately severe disability     Balance Overall balance assessment: Needs assistance Sitting-balance support: No upper extremity supported, Feet supported Sitting balance-Leahy Scale: Fair     Standing balance support: Bilateral upper extremity supported, During functional  activity Standing balance-Leahy Scale: Poor Standing balance comment: relies on UE support and external support                             Pertinent Vitals/Pain Pain Assessment Pain Assessment: No/denies pain    Home Living Family/patient expects to be discharged to:: Private residence Living Arrangements: Children Available  Help at Discharge: Family;Available 24 hours/day;Personal care attendant (daughter, aide 5 days week for 2- 3  hours day) Type of Home: Apartment           Home Equipment: Cane - single point;Wheelchair - manual;Grab bars - toilet;Grab bars - tub/shower;Hand held shower head;BSC/3in1;Rollator (4 wheels)      Prior Function Prior Level of Function : Independent/Modified Independent             Mobility Comments: used rollator at times per pt ADLs Comments: B/D self, aide gets pt groceries     Extremity/Trunk Assessment   Upper Extremity Assessment Upper Extremity Assessment: Defer to OT evaluation    Lower Extremity Assessment Lower Extremity Assessment: RLE deficits/detail RLE Deficits / Details: grossly 4-/5 RLE Coordination: decreased fine motor;decreased gross motor    Cervical / Trunk Assessment Cervical / Trunk Assessment: Kyphotic  Communication   Communication Communication: Impaired Factors Affecting Communication: Difficulty expressing self;Reduced clarity of speech    Cognition Arousal: Alert Behavior During Therapy: Impulsive   PT - Cognitive impairments: Orientation, Awareness, Attention, Initiation, Sequencing, Problem solving, Safety/Judgement   Orientation impairments: Situation, Place (Pt unaware of why he is in hospital and states he is in doctors office initiialy but then with cues and time states hospital.)                     Following commands: Impaired Following commands impaired: Follows one step commands with increased time     Cueing Cueing Techniques: Verbal cues, Tactile cues     General Comments General comments (skin integrity, edema, etc.): 72 bpm, Set pt up to eat breakfast at end of session and pt encouraged to use his left UE as right UE with poor coordination and pt appears to have decr awareness/attention to right side.  Prior to PT getting off floor heard pt yell and went back into room and he had spilled his coffee  therefore PT cleaned pt and changed his linens.  Coffee went on towel in lap mostly but some on gown and pad therefore PT changed gown and pad.  Pt kept apologizing.  PT encouraged pt and recommended that he only eat hot items with assist and nurse aware.    Exercises     Assessment/Plan    PT Assessment Patient needs continued PT services  PT Problem List Decreased activity tolerance;Decreased balance;Decreased mobility;Decreased knowledge of use of DME;Decreased safety awareness;Decreased knowledge of precautions;Decreased coordination;Decreased cognition       PT Treatment Interventions DME instruction;Gait training;Stair training;Functional mobility training;Therapeutic activities;Therapeutic exercise;Balance training;Patient/family education;Cognitive remediation;Neuromuscular re-education    PT Goals (Current goals can be found in the Care Plan section)  Acute Rehab PT Goals Patient Stated Goal: to go home PT Goal Formulation: With patient Time For Goal Achievement: 04/06/24 Potential to Achieve Goals: Good    Frequency Min 3X/week     Co-evaluation               AM-PAC PT "6 Clicks" Mobility  Outcome Measure Help needed turning from your back to your side while in a flat bed without  using bedrails?: A Little Help needed moving from lying on your back to sitting on the side of a flat bed without using bedrails?: A Little Help needed moving to and from a bed to a chair (including a wheelchair)?: A Lot Help needed standing up from a chair using your arms (e.g., wheelchair or bedside chair)?: A Little Help needed to walk in hospital room?: A Lot Help needed climbing 3-5 steps with a railing? : A Lot 6 Click Score: 15    End of Session Equipment Utilized During Treatment: Gait belt Activity Tolerance: Patient limited by fatigue Patient left: in chair;with call bell/phone within reach;with chair alarm set Nurse Communication: Mobility status PT Visit Diagnosis:  Unsteadiness on feet (R26.81);Other abnormalities of gait and mobility (R26.89);Muscle weakness (generalized) (M62.81);Hemiplegia and hemiparesis Hemiplegia - Right/Left: Right Hemiplegia - dominant/non-dominant: Dominant Hemiplegia - caused by: Cerebral infarction    Time: 1610-9604 PT Time Calculation (min) (ACUTE ONLY): 42 min   Charges:   PT Evaluation $PT Eval Moderate Complexity: 1 Mod PT Treatments $Gait Training: 8-22 mins $Therapeutic Activity: 8-22 mins PT General Charges $$ ACUTE PT VISIT: 1 Visit         Akanksha Bellmore M,PT Acute Rehab Services [phone]   Florencia Hunter 03/23/2024, 10:45 AM

## 2024-03-23 NOTE — TOC CAGE-AID Note (Signed)
 Transition of Care [facility]) - CAGE-AID Screening   Patient Details  Name: Jesus Lam MRN: [id number] Date of Birth: Oct 22, 1933  Transition of [facility]) CM/SW Contact:    Faruq Rosenberger E Satchel Heidinger, LCSW Phone Number: 03/23/2024, 11:32 AM   Clinical Narrative: Patient states he uses marijuana daily. Patient states he uses alcohol on occasion. Patient denies substance use resources.    CAGE-AID Screening:    Have You Ever Felt You Ought to Cut Down on Your Drinking or Drug Use?: No Have People Annoyed You By Critizing Your Drinking Or Drug Use?: No Have You Felt Bad Or Guilty About Your Drinking Or Drug Use?: No Have You Ever Had a Drink or Used Drugs First Thing In The Morning to Steady Your Nerves or to Get Rid of a Hangover?: No CAGE-AID Score: 0  Substance Abuse Education Offered: Yes

## 2024-03-23 NOTE — Evaluation (Signed)
 Occupational Therapy Evaluation Patient Details Name: Jesus Lam MRN: [id number] DOB: 1933-02-27 Today's Date: 03/23/2024   History of Present Illness   Patient is a 89 y.o.  male admitted 4/28 presented with new onset expressive aphasia.   Left superior parietal infarct.  PMH: HTN, HLD, VTE on Eliquis , CVA 2019     Clinical Impressions Pt reports ind at baseline with ADLs/functional mobility, lives alone but has an aide that comes daily for a few hours to assist with IADL tasks. Pt currently with speech and R sided deficits. Pt needing set up - mod A for ADLs, CGA for bed mobility and min A for transfers with RW. Pt urgently identifies need to use the bathroom, min cues for safety with movement and pt noted to bump into items and forget to place R hand on RW with transfers. Pt presenting with impairments listed below, will follow acutely. Patient will benefit from intensive inpatient follow-up therapy, >3 hours/day to maximize safety/ind with ADL/functional mobility.      If plan is discharge home, recommend the following:   A little help with walking and/or transfers;A lot of help with bathing/dressing/bathroom;Assistance with cooking/housework;Direct supervision/assist for financial management;Direct supervision/assist for medications management;Assist for transportation;Help with stairs or ramp for entrance;Supervision due to cognitive status     Functional Status Assessment   Patient has had a recent decline in their functional status and demonstrates the ability to make significant improvements in function in a reasonable and predictable amount of time.     Equipment Recommendations   Other (comment) (defer)     Recommendations for Other Services   PT consult;Rehab consult     Precautions/Restrictions   Precautions Precautions: Fall Restrictions Weight Bearing Restrictions Per Provider Order: No     Mobility Bed Mobility Overal bed mobility: Needs  Assistance Bed Mobility: Supine to Sit, Sit to Supine     Supine to sit: Contact guard Sit to supine: Contact guard assist        Transfers Overall transfer level: Needs assistance Equipment used: Rolling walker (2 wheels) Transfers: Sit to/from Stand, Bed to chair/wheelchair/BSC Sit to Stand: Min assist, From elevated surface                  Balance Overall balance assessment: Needs assistance Sitting-balance support: No upper extremity supported, Feet supported Sitting balance-Leahy Scale: Good Sitting balance - Comments: unsupported EOB   Standing balance support: Bilateral upper extremity supported, During functional activity Standing balance-Leahy Scale: Poor Standing balance comment: relies on UE support and external support                           ADL either performed or assessed with clinical judgement   ADL Overall ADL's : Needs assistance/impaired Eating/Feeding: Set up;Sitting   Grooming: Wash/dry face;Minimal assistance;Standing   Upper Body Bathing: Moderate assistance;Sitting   Lower Body Bathing: Moderate assistance;Sitting/lateral leans   Upper Body Dressing : Minimal assistance;Sitting   Lower Body Dressing: Minimal assistance;Sitting/lateral leans   Toilet Transfer: Minimal assistance;Ambulation;Rolling walker (2 wheels);Regular Toilet   Toileting- Clothing Manipulation and Hygiene: Contact guard assist       Functional mobility during ADLs: Minimal assistance;Rolling walker (2 wheels)       Vision Baseline Vision/History: 1 Wears glasses Patient Visual Report: Other (comment) (pt unable to describe, inconsistent with description, states he wears glasses all the time but they are not present at the hospital) Vision Assessment?: Yes Eye Alignment: Within Functional Limits Ocular Range  of Motion: Within Functional Limits Alignment/Gaze Preference: Within Defined Limits Tracking/Visual Pursuits: Able to track stimulus in  all quads without difficulty Depth Perception: Undershoots Additional Comments: undershooting with finger to nose test, noted to identify more L > R stimuli when held up bilaterally     Perception Perception: Impaired Preception Impairment Details: Inattention/Neglect Perception-Other Comments: R inattention   Praxis Praxis: Not tested       Pertinent Vitals/Pain Pain Assessment Pain Assessment: No/denies pain     Extremity/Trunk Assessment Upper Extremity Assessment Upper Extremity Assessment: Generalized weakness;Right hand dominant;RUE deficits/detail RUE Deficits / Details: 3+/5 compared to LUE, drifts with vision occluded, , decr coordination, unable to  perform digit opposition (though may have a cog component) RUE Coordination: decreased fine motor;decreased gross motor   Lower Extremity Assessment Lower Extremity Assessment: Defer to PT evaluation   Cervical / Trunk Assessment Cervical / Trunk Assessment: Kyphotic   Communication Communication Communication: Impaired Factors Affecting Communication: Difficulty expressing self;Reduced clarity of speech;Other (comment) (stuttering speech)   Cognition Arousal: Alert Behavior During Therapy: Impulsive Cognition: Cognition impaired   Orientation impairments: Situation, Place (when asked if he is at [facility] or [facility], pt states "neither") Awareness: Online awareness impaired     Executive functioning impairment (select all impairments): Problem solving OT - Cognition Comments: limited by expressive aphasia                 Following commands: Impaired Following commands impaired: Follows one step commands with increased time     Cueing  General Comments   Cueing Techniques: Verbal cues;Tactile cues  VSS   Exercises     Shoulder Instructions      Home Living Family/patient expects to be discharged to:: Private residence Living Arrangements: Alone Available Help at Discharge:  Family;Personal care attendant (aide 5 days/week for 2-3 hrs/day) Type of Home: Apartment Home Access: Elevator (reports he is in the "15th floor")     Home Layout: One level     Bathroom Shower/Tub: Tub/shower unit         Home Equipment: Cane - single point;Wheelchair - manual;Grab bars - toilet;Grab bars - tub/shower;Hand held shower head;BSC/3in1;Rollator (4 wheels)          Prior Functioning/Environment Prior Level of Function : Independent/Modified Independent             Mobility Comments: rollator at times ADLs Comments: B/D self, aide gets pt groceries and provided transportation, manages own meds,    OT Problem List: Decreased strength;Decreased range of motion;Decreased activity tolerance;Impaired balance (sitting and/or standing);Impaired vision/perception;Decreased safety awareness;Decreased cognition;Decreased coordination;Impaired sensation;Impaired UE functional use   OT Treatment/Interventions: Self-care/ADL training;Therapeutic exercise;Energy conservation;DME and/or AE instruction;Therapeutic activities;Balance training;Patient/family education      OT Goals(Current goals can be found in the care plan section)   Acute Rehab OT Goals Patient Stated Goal: none stated OT Goal Formulation: With patient Time For Goal Achievement: 04/06/24 Potential to Achieve Goals: Good ADL Goals Pt Will Perform Upper Body Dressing: with modified independence;sitting Pt Will Perform Lower Body Dressing: with modified independence;sitting/lateral leans;sit to/from stand Pt Will Transfer to Toilet: with modified independence;ambulating;regular height toilet Pt Will Perform Tub/Shower Transfer: Tub transfer;Shower transfer;with modified independence;ambulating Pt/caregiver will Perform Home Exercise Program: Increased ROM;Increased strength;Right Upper extremity;With Supervision;With written HEP provided Additional ADL Goal #1: pt will locate items on R side with 80%  accuracy and min cues in order to improve R inattention   OT Frequency:  Min 2X/week    Co-evaluation  AM-PAC OT "6 Clicks" Daily Activity     Outcome Measure Help from another person eating meals?: A Little Help from another person taking care of personal grooming?: A Little Help from another person toileting, which includes using toliet, bedpan, or urinal?: A Lot Help from another person bathing (including washing, rinsing, drying)?: A Lot Help from another person to put on and taking off regular upper body clothing?: A Little Help from another person to put on and taking off regular lower body clothing?: A Lot 6 Click Score: 15   End of Session Equipment Utilized During Treatment: Gait belt;Rolling walker (2 wheels) Nurse Communication: Mobility status  Activity Tolerance: Patient tolerated treatment well Patient left: in bed;with call bell/phone within reach;with bed alarm set  OT Visit Diagnosis: Unsteadiness on feet (R26.81);Other abnormalities of gait and mobility (R26.89);Muscle weakness (generalized) (M62.81);Other symptoms and signs involving the nervous system (R29.898);Other symptoms and signs involving cognitive function;Cognitive communication deficit (R41.841)                Time: 1308-6578 OT Time Calculation (min): 22 min Charges:  OT General Charges $OT Visit: 1 Visit OT Evaluation $OT Eval Moderate Complexity: 1 Mod  Commodore Bellew K, OTD, OTR/L SecureChat Preferred Acute Rehab [phone]   Benedict Brain Koonce 03/23/2024, 4:26 PM

## 2024-03-23 NOTE — Progress Notes (Addendum)
 PROGRESS NOTE        PATIENT DETAILS Name: Jesus Lam Age: 89 y.o. Sex: male Date of Birth: 01/30/1933 Admit Date: 03/22/2024 Admitting Physician Arne Langdon, MD NWG:[id number], Abelino Able, FNP  Brief Summary: Patient is a 89 y.o.  male with history of HTN, HLD, VTE on Eliquis , CVA 2019-presented with new onset expressive aphasia.  Significant events: 4/28>> admit to [facility]  Significant studies: 4/28>> CT head: No acute intracranial abnormality 4/28>> CTA head/neck: No LVO-completed infarct in the superior left parietal cortex.  No significant stenosis in the carotid system. 4/29>> LDL: 49 4/29>> A1c: 4.8  Significant microbiology data: None  Procedures: None  Consults: Neurology Cards  Subjective: Some expressive aphasia-at times able to answer questions appropriately/fluently.  Objective: Vitals: Blood pressure (!) 140/74, pulse 65, temperature 98.1 F (36.7 C), temperature source Oral, resp. rate 20, SpO2 97%.   Exam: Gen Exam:Alert awake-not in any distress HEENT:atraumatic, normocephalic Chest: B/L clear to auscultation anteriorly CVS:S1S2 regular Abdomen:soft non tender, non distended Extremities:no edema Neurology: Non focal Skin: no rash  Pertinent Labs/Radiology:    Latest Ref Rng & Units 03/22/2024   10:39 AM 03/22/2024   10:33 AM 08/06/2023   11:30 AM  CBC  WBC 4.0 - 10.5 K/uL  6.3  5.3   Hemoglobin 13.0 - 17.0 g/dL 30.8  65.7  84.6   Hematocrit 39.0 - 52.0 % 37.0  36.8  36.6   Platelets 150 - 400 K/uL  166  186     Lab Results  Component Value Date   NA 145 03/22/2024   K 3.9 03/22/2024   CL 112 (H) 03/22/2024   CO2 22 03/22/2024      Assessment/Plan: Acute CVA Continues to have expressive aphasia No other focal deficits on exam Acknowledges that he may have missed some doses of Eliquis  Awaiting MRI brain/echo/stroke team evaluation PT/OT eval pending  HTN BP relatively stable Bisoprolol  held Continue  amlodipine /Lasix .  HLD Statin  BPH Flomax   History of DVT Eliquis   Sinus bradycardia with sinus pauses Asymptomatic even while heart rate is in the low 40s/high 30s (sitting in the chair) Holding bisoprolol /Aricept . Cardiology consulted.  Chronic HFrEF (EF 25-30% by echo in 2019) Euvolemic Continue Lasix  Holding bisoprolol  due to sinus bradycardia. Awaiting repeat echo.  PAD-s/p left SFA intervention in 2019 Suspect not on antiplatelets as on anticoagulation Continue statin  Hyperthyroidism TSH suppressed-FT4 elevated Beta-blocker held due to bradycardia Starting Tapazole .  Dementia/cognitive dysfunction Aricept  on hold-see above Appears to be mild-currently compounded by expressive aphasia Delirium precautions.  Class 1 Obesity: Estimated body mass index is 32.44 kg/m as calculated from the following:   Height as of 11/03/23: 6' (1.829 m).   Weight as of 11/03/23: 108.5 kg.   Code status:   Code Status: Full Code   DVT Prophylaxis: apixaban  (ELIQUIS ) tablet 5 mg     Family Communication: Daughter-[name]-[phone] updated 4/29   Disposition Plan: Status is: Observation The patient will require care spanning > 2 midnights and should be moved to inpatient because: Severity of illness   Planned Discharge Destination:Home   Diet: Diet Order             Diet Heart Room service appropriate? Yes; Fluid consistency: Thin  Diet effective now                     Antimicrobial agents: Anti-infectives (  From admission, onward)    None        MEDICATIONS: Scheduled Meds:  amLODipine   5 mg Oral Daily   apixaban   5 mg Oral BID   atorvastatin   80 mg Oral Daily   azelastine   2 spray Each Nare BID   bisoprolol   2.5 mg Oral Daily   donepezil   10 mg Oral QHS   furosemide   20 mg Oral Daily   gabapentin   300 mg Oral QHS   tamsulosin   0.4 mg Oral Daily   Continuous Infusions: PRN Meds:.   I have personally reviewed following labs and imaging  studies  LABORATORY DATA: CBC: Recent Labs  Lab 03/22/24 1033 03/22/24 1039  WBC 6.3  --   NEUTROABS 3.2  --   HGB 11.7* 12.6*  HCT 36.8* 37.0*  MCV 93.6  --   PLT 166  --     Basic Metabolic Panel: Recent Labs  Lab 03/22/24 1033 03/22/24 1039  NA 142 145  K 3.9 3.9  CL 110 112*  CO2 22  --   GLUCOSE 106* 103*  BUN 16 17  CREATININE 1.15 1.20  CALCIUM  9.5  --     GFR: CrCl cannot be calculated (Unknown ideal weight.).  Liver Function Tests: Recent Labs  Lab 03/22/24 1033  AST 21  ALT 15  ALKPHOS 72  BILITOT 0.7  PROT 7.6  ALBUMIN 3.3*   No results for input(s): "LIPASE", "AMYLASE" in the last 168 hours. No results for input(s): "AMMONIA" in the last 168 hours.  Coagulation Profile: No results for input(s): "INR", "PROTIME" in the last 168 hours.  Cardiac Enzymes: No results for input(s): "CKTOTAL", "CKMB", "CKMBINDEX", "TROPONINI" in the last 168 hours.  BNP (last 3 results) Recent Labs    09/17/23 1504  PROBNP 213    Lipid Profile: Recent Labs    03/23/24 0836  CHOL 94  HDL 23*  LDLCALC 49  TRIG 119  CHOLHDL 4.1    Thyroid  Function Tests: Recent Labs    03/23/24 0836  TSH <0.010*  FREET4 1.30*    Anemia Panel: No results for input(s): "VITAMINB12", "FOLATE", "FERRITIN", "TIBC", "IRON", "RETICCTPCT" in the last 72 hours.  Urine analysis:    Component Value Date/Time   COLORURINE YELLOW 08/06/2023 1316   APPEARANCEUR CLOUDY (A) 08/06/2023 1316   LABSPEC 1.014 08/06/2023 1316   PHURINE 6.0 08/06/2023 1316   GLUCOSEU NEGATIVE 08/06/2023 1316   HGBUR NEGATIVE 08/06/2023 1316   BILIRUBINUR Negative 11/03/2023 1642   KETONESUR NEGATIVE 08/06/2023 1316   PROTEINUR Positive (A) 11/03/2023 1642   PROTEINUR 30 (A) 08/06/2023 1316   UROBILINOGEN 0.2 11/03/2023 1642   UROBILINOGEN 0.2 03/26/2022 1619   NITRITE Negative 11/03/2023 1642   NITRITE NEGATIVE 08/06/2023 1316   LEUKOCYTESUR Small (1+) (A) 11/03/2023 1642   LEUKOCYTESUR  LARGE (A) 08/06/2023 1316    Sepsis Labs: Lactic Acid, Venous No results found for: "LATICACIDVEN"  MICROBIOLOGY: No results found for this or any previous visit (from the past 240 hours).  RADIOLOGY STUDIES/RESULTS: CT ANGIO HEAD NECK W WO CM W PERF (CODE STROKE) Result Date: 03/22/2024 CLINICAL DATA:  Stroke workup EXAM: CT ANGIOGRAPHY HEAD AND NECK CT PERFUSION BRAIN TECHNIQUE: Multidetector CT imaging of the head and neck was performed using the standard protocol during bolus administration of intravenous contrast. Multiplanar CT image reconstructions and MIPs were obtained to evaluate the vascular anatomy. Carotid stenosis measurements (when applicable) are obtained utilizing NASCET criteria, using the distal internal carotid diameter as the denominator. Multiphase  CT imaging of the brain was performed following IV bolus contrast injection. Subsequent parametric perfusion maps were calculated using RAPID software. RADIATION DOSE REDUCTION: This exam was performed according to the departmental dose-optimization program which includes automated exposure control, adjustment of the mA and/or kV according to patient size and/or use of iterative reconstruction technique. CONTRAST:  OMNIPAQUE  IOHEXOL  350 MG/ML SOLN COMPARISON:  Noncontrast head CT from earlier today FINDINGS: CTA NECK FINDINGS Aortic arch: Extensive atheromatous plaque, 2 vessel branching Right carotid system: Moderate atheromatous plaque at the bifurcation without flow reducing stenosis, ulceration, or beading Left carotid system: Moderate atheromatous plaque at the bifurcation without stenosis, ulceration, or beading. Vertebral arteries: No proximal subclavian stenosis. Calcified plaque at the right vertebral origin causing mild-to-moderate stenosis. Robust flow in the left vertebral artery which is dominant. Skeleton: No acute finding Other neck: Goiter with asymmetric heterogeneous enlargement of the left lobe pattern unchanged  from 2023 CT of the neck. Low-density nodule with calcification in the right lower internal jugular vein attributed to nonocclusive, chronic thrombus. Upper chest: No acute finding Review of the MIP images confirms the above findings CTA HEAD FINDINGS Anterior circulation: No major branch occlusion, beading, or proximal flow reducing stenosis. Mild atheromatous calcification at the cavernous carotids. Posterior circulation: The vertebral arteries show atheromatous calcification. No flow reducing stenosis seen in the vertebral or basilar circulation. No branch occlusion. Venous sinuses: Unremarkable Anatomic variants: None significant Review of the MIP images confirms the above findings CT Brain Perfusion Findings: CBF (<30%) Volume: 63mL-this has a early CT correlate. Perfusion (Tmax>6.0s) volume: 21mL Mismatch Volume: 15mL Case discussed with Dr. Lindzen at time of dictation. IMPRESSION: No emergent large vessel occlusion. 6 cc acute, completed infarct in the superior left parietal cortex. There is 15 cc of adjacent penumbra intervening this infarct and a pre-existing left parietal infarct which is chronic. The affected peripheral branch vessel is not visualized. Atherosclerosis without flow reducing stenosis or embolic source seen in the proximal vasculature. Electronically Signed   By: Ronnette Coke M.D.   On: 03/22/2024 11:24   CT HEAD CODE STROKE WO CONTRAST` Result Date: 03/22/2024 CLINICAL DATA:  Code stroke. Neuro deficit, concern for stroke, aphasia. EXAM: CT HEAD WITHOUT CONTRAST TECHNIQUE: Contiguous axial images were obtained from the base of the skull through the vertex without intravenous contrast. RADIATION DOSE REDUCTION: This exam was performed according to the departmental dose-optimization program which includes automated exposure control, adjustment of the mA and/or kV according to patient size and/or use of iterative reconstruction technique. COMPARISON:  CT head 11/30/2020, MRI head  06/13/2018. FINDINGS: Brain: No acute intracranial hemorrhage. No CT evidence of acute infarct. Redemonstrated remote infarct involving the left temporal occipital cortex and subcortical white matter. Nonspecific hypoattenuation in the periventricular and subcortical white matter favored to reflect chronic microvascular ischemic changes. No edema, mass effect, or midline shift. The basilar cisterns are patent. Ventricles: Prominence of the ventricles suggesting underlying parenchymal volume loss. Vascular: Atherosclerotic calcifications of the carotid siphons and intracranial vertebral arteries. No hyperdense vessel. Skull: No acute or aggressive finding. Orbits: Orbits are symmetric. Sinuses: Mucous retention cyst in the right maxillary sinus. Other: Mastoid air cells are clear. 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 ASPECTS [facility] Stroke Program Early CT Score) - Ganglionic level infarction (caudate, lentiform nuclei, internal capsule, insula, M1-M3 cortex): 7 - Supraganglionic infarction (M4-M6 cortex): 3 Total score (0-10 with 10 being normal): 10 IMPRESSION: 1. No CT evidence of acute intracranial abnormality. 2. Redemonstrated remote infarct involving  the left temporal occipital lobes. 3. Mild chronic microvascular ischemic changes and mild parenchymal volume loss. 4. ASPECTS is 10 These results were communicated to Dr. Lindzen At 11:07 am on 03/22/2024 by text page via the [facility] messaging system. Electronically Signed   By: Denny Flack M.D.   On: 03/22/2024 11:07     LOS: 0 days   Kimberly Penna, MD  [facility]    To contact the attending provider between 7A-7P or the covering provider during after hours 7P-7A, please log into the web site [email or web address] and access using universal [place] password for that web site. If you do not have the password, please call the hospital operator.  03/23/2024, 10:02 AM

## 2024-03-23 NOTE — Progress Notes (Signed)
   03/23/24 0407  Mobility  HOB Elevated/Bed Position HOB 30  Activity Ambulated with assistance in room;Ambulated with assistance to bathroom  Range of Motion/Exercises Active Assistive;All extremities  Level of Assistance Minimal assist, patient does 75% or more  Assistive Device Front wheel walker  Distance Ambulated (ft) 60 ft  Activity Response Tolerated well  Transport method Ambulatory   Brain Cahill, RN

## 2024-03-23 NOTE — Progress Notes (Addendum)
   03/23/24 0343  Neurological  Neuro (WDL) X  Orientation Level Oriented X4  Cognition Appropriate attention/concentration;Appropriate judgement;Appropriate safety awareness;Appropriate for developmental age;Follows commands  Speech Expressive aphasia;Clear  R Pupil Size (mm) 3  R Pupil Shape Round  R Pupil Reaction Brisk  L Pupil Size (mm) 3  L Pupil Shape Round  L Pupil Reaction Brisk  Motor Function/Sensation Assessment Head;Grip;Elbow extension;Elbow flexion;Pronator drift;Dorsiflexion;Plantar flexion;Arm ataxia;Leg ataxia;Motor response;Motor strength;Sensation  Facial Symmetry Symmetrical  R Hand Grip Moderate  L Hand Grip Strong  R Elbow Extension (Push/Biceps) Strong  L Elbow Extension (Push/Biceps) Strong  R Elbow Flexion (Pull/Triceps) Strong  L Elbow Flexion (Pull/Triceps) Strong  Right Pronator Drift Present  Left Pronator Drift Absent  R Foot Dorsiflexion Moderate  L Foot Dorsiflexion Strong  R Foot Plantar Flexion Moderate  L Foot Plantar Flexion Strong  R Finger to Nose (Point to Point) Ataxia  L Finger to Nose (Point to [employer]) Smooth  R Heel to [employer] (Point to [employer]) Ataxia  L Heel to [employer] (Point to [employer]) Smooth  RUE Motor Response Purposeful movement  RUE Sensation Full sensation  RUE Motor Strength 4  LUE Motor Response Purposeful movement  LUE Sensation Full sensation  LUE Motor Strength 5  RLE Motor Response Purposeful movement  RLE Sensation Numbness;Decreased  RLE Motor Strength 5  LLE Motor Response Purposeful movement  LLE Sensation Full sensation  LLE Motor Strength 5  Neuro Symptoms Fatigue  Neuro symptoms relieved by Relaxation techniques (Comment);Rest  Glasgow Coma Scale  Eye Opening 4  Best Verbal Response (NON-intubated) 5  Best Motor Response 6  Glasgow Coma Scale Score 15  NIH Stroke Scale   Dizziness Present No  Headache Present No  Interval Shift assessment  Level of Consciousness (1a.)    0  LOC Questions (1b. )    0  LOC  Commands (1c. )    0  Best Gaze (2. )   0  Visual (3. )   0  Facial Palsy (4. )     0  Motor Arm, Left (5a. )    0  Motor Arm, Right (5b. )  1  Motor Leg, Left (6a. )   0  Motor Leg, Right (6b. )  1  Limb Ataxia (7. ) 2  Sensory (8. )   1  Best Language (9. )   1  Dysarthria (10. ) 1  Extinction/Inattention (11.)    0  Complete NIHSS TOTAL 7    Vital signs stable, afebrile, no acute distress, neuro signs deficits per document above. MD aware. Pt is able to ambulate to restroom with a walker and standby assisted. Plan of care is reviewed. We will continue to monitor.  Brain Cahill, RN

## 2024-03-23 NOTE — Evaluation (Signed)
 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 stage 3a chronic kidney disease (HCC) 08/26/2023   Leg swelling 08/25/2023   Benign hypertension without CHF 06/17/2023   Class 1 obesity due to excess calories with serious comorbidity and body mass index (BMI) of 32.0 to 32.9 in adult 06/17/2023   Does use hearing aid 06/17/2023   Urine leukocytes 06/17/2023   Bilateral hearing loss 06/17/2023   Dysuria 06/17/2023   Stage 3 chronic kidney disease, unspecified whether stage 3a or 3b CKD (HCC) 01/20/2023   Hyperthyroidism 01/17/2023   Bilateral impacted cerumen 11/01/2021   Presbycusis of both ears 11/01/2021   Leg edema 08/01/2021   Mild cognitive impairment 09/07/2020   Chronic pain of left knee 03/15/2020   Pure hypercholesterolemia 11/15/2019   First degree AV block 07/28/2019   Hypertriglyceridemia 04/29/2019   Mixed hyperlipidemia 04/29/2019   CVA (cerebral vascular accident) (HCC)    Bradycardia    Subclinical hyperthyroidism 11/18/2018   PAD (peripheral artery disease) (HCC) 06/13/2018   Ischemic cerebrovascular accident (CVA) (HCC) 06/13/2018   Dysarthria 06/12/2018   Renal insufficiency 06/12/2018   Claudication (HCC) 01/11/2018   Anemia 10/10/2017   Past Medical History:  Past Medical History:  Diagnosis Date   Anemia    BPH (benign prostatic hyperplasia)    Cataracts, bilateral    CVA (cerebral vascular  accident) (HCC)    DVT (deep venous thrombosis) (HCC)    dvt in left leg   Dyslipidemia    Gout    Gout    HTN (hypertension)    Left hip pain 03/15/2020   Left leg pain    Osteoarthritis    PAD (peripheral artery disease) (HCC)    Stasis dermatitis    Stroke (HCC)    Vitamin D  deficiency    Past Surgical History:  Past Surgical History:  Procedure Laterality Date   ABDOMINAL AORTOGRAM N/A 01/13/2018   Procedure: ABDOMINAL AORTOGRAM;  Surgeon: Cody Das, MD;  Location: [facility];  Service: Cardiovascular;  Laterality: N/A;   CATARACT EXTRACTION, BILATERAL     LEFT HEART CATH AND CORONARY ANGIOGRAPHY N/A 01/13/2018   Procedure: LEFT HEART CATH AND CORONARY ANGIOGRAPHY;  Surgeon: Cody Das, MD;  Location: [facility];  Service: Cardiovascular;  Laterality: N/A;   LOWER EXTREMITY ANGIOGRAPHY N/A 01/13/2018   Procedure: LOWER EXTREMITY ANGIOGRAPHY;  Surgeon: Cody Das, MD;  Location: [facility];  Service: Cardiovascular;  Laterality: N/A;   LOWER EXTREMITY ANGIOGRAPHY Right 01/27/2018   Procedure: LOWER EXTREMITY ANGIOGRAPHY;  Surgeon: Knox Perl, MD;  Location: [facility];  Service: Cardiovascular;  Laterality: Right;   PERIPHERAL VASCULAR ATHERECTOMY Left 01/13/2018   Procedure: PERIPHERAL VASCULAR ATHERECTOMY;  Surgeon: Cody Das, MD;  Location: [facility];  Service: Cardiovascular;  Laterality: Left;  SFA WITH PTA DRUG COATED BALLOON   PERIPHERAL VASCULAR INTERVENTION  01/27/2018   Procedure: PERIPHERAL VASCULAR INTERVENTION;  Surgeon: Knox Perl, MD;  Location: [facility];  Service: Cardiovascular;;   HPI:  Ram Creviston is a 89 y.o. male with medical history significant of HTN, HLD, CVA, and prior DVT on Eliquis  p/w "acute metabolic encephalopathy and presumed L parietal CVA" per MD note. Pending MRI. [employer].   Assessment / Plan / Recommendation Clinical Impression  Patient presents with  expressive>receptive language deficits as well as mild dysarthria + neurogenic stutter.   Patients expressive language is characterized by deficits in confrontational naming, repetition and automatic speech. Patient is able to count 1-10 and verbalize name, however presents with perseveration's and motor speech errors during days of the week. Patient benefits from phonemic and semantic cues during functional confrontational naming tasks. Receptive language is functional for basic communication as patient is able to answer basic yes/no questions with 100% accuracy and mildly complex yes/no questions with 80% accuracy. Patient is also able to follow single step commands accurately, however begins to demonstrate difficulty as additional commands are added. Patient also presents with mild dysarthria + neurogenic stuttering characterized by imprecise articulation, part and whole word repetitions resulting in ~70% intelligibility at the sentence level.    Recommend targeting expressive communication during acute rehab stay to ensure patient is able to communicate wants/needs.    SLP Assessment  SLP Recommendation/Assessment: Patient needs continued Speech Lanaguage Pathology Services SLP Visit Diagnosis: Cognitive communication deficit (R41.841);Dysarthria and anarthria (R47.1);Aphasia (R47.01)    Recommendations for follow up therapy are one component of a multi-disciplinary discharge planning process, led by the attending physician.  Recommendations may be updated based on patient status, additional functional criteria and insurance authorization.    Follow Up Recommendations  Home health SLP    Assistance Recommended at Discharge  Intermittent Supervision/Assistance  Functional Status Assessment Patient has had a recent decline in their functional status and demonstrates the ability to make significant improvements in function in a reasonable and predictable amount of time.  Frequency and Duration min  3x week  2 weeks      SLP Evaluation Cognition  Overall Cognitive Status: Difficult to assess (due to language impairments) Arousal/Alertness: Awake/alert Orientation Level: Oriented to person;Oriented to situation;Oriented to place;Disoriented to time (vs language) Awareness: Impaired (occasionally not aware of language errors)       Comprehension  Auditory Comprehension Overall Auditory Comprehension: Impaired Yes/No Questions: Within Functional Limits Commands: Impaired One Step Basic Commands: 75-100% accurate Two Step Basic Commands: 50-74% accurate Multistep Basic Commands: 50-74% accurate    Expression Expression Primary Mode of Expression: Verbal Verbal Expression Overall Verbal Expression: Impaired Initiation: No impairment Automatic Speech: Name;Social Response;Counting (difficulty w/ DOW) Level of Generative/Spontaneous Verbalization: Sentence Repetition: Impaired Level of Impairment: Word level Naming: Impairment Confrontation: Impaired Verbal Errors: Semantic paraphasias;Perseveration;Aware of errors;Not aware of errors Pragmatics: No impairment Effective Techniques: Semantic cues;Written cues;Phonemic cues;Sentence completion   Oral / Motor  Oral Motor/Sensory Function Overall Oral Motor/Sensory Function: Generalized oral weakness Motor Speech Overall Motor Speech: Impaired Respiration: Within functional limits Phonation: Normal Resonance: Within functional limits Articulation: Impaired Level of Impairment: Sentence Intelligibility: Intelligibility reduced Sentence: 50-74% accurate Motor Planning: Impaired (neurogenic stuttering) Effective Techniques: Over-articulate (easy onset, cancellations)            Alverda Nazzaro M.A., CCC-SLP 03/23/2024, 9:24 AM

## 2024-03-23 NOTE — [place] FL2 (Signed)
 [place]  MEDICAID FL2 LEVEL OF CARE FORM     IDENTIFICATION  Patient Name: Jesus Lam Birthdate: 20-Feb-1933 Sex: male Admission Date (Current Location): 03/22/2024  [facility] and [place] Number:  Chiropodist and Address:  [facility], [address] [place], [place] [address]      Provider Number: [id number]  Attending Physician Name and Address:  Burton Casey, MD  Relative Name and Phone Number:  Dutson,[name] (Daughter)  [phone] (Home Phone)    Current Level of Care: Hospital Recommended Level of Care: Skilled Nursing Facility Prior Approval Number:    Date Approved/Denied:   PASRR Number: [id number]  Discharge Plan:      Current Diagnoses: Patient Active Problem List   Diagnosis Date Noted   Acute CVA (cerebrovascular accident) (HCC) 03/23/2024   CVA (cerebrovascular accident due to intracerebral hemorrhage) (HCC) 03/22/2024   Lower abdominal pain 11/03/2023   Need for influenza vaccination 11/03/2023   Difficult or painful urination 11/03/2023   Deep venous thrombosis (DVT) of left peroneal vein (HCC) 09/24/2023   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 stage 3a chronic kidney disease (HCC) 08/26/2023   Leg swelling 08/25/2023   Benign hypertension without CHF 06/17/2023   Class 1 obesity due to excess calories with serious comorbidity and body mass index (BMI) of 32.0 to 32.9 in adult 06/17/2023   Does use hearing aid 06/17/2023   Urine leukocytes 06/17/2023   Bilateral hearing loss 06/17/2023   Dysuria 06/17/2023   Stage 3 chronic kidney disease, unspecified whether stage 3a or 3b CKD (HCC) 01/20/2023   Hyperthyroidism 01/17/2023   Bilateral impacted cerumen 11/01/2021   Presbycusis of both ears 11/01/2021   Leg edema 08/01/2021   Mild cognitive impairment 09/07/2020   Chronic pain of left knee 03/15/2020   Pure hypercholesterolemia 11/15/2019   First degree AV block 07/28/2019    Hypertriglyceridemia 04/29/2019   Mixed hyperlipidemia 04/29/2019   CVA (cerebral vascular accident) (HCC)    Bradycardia    Subclinical hyperthyroidism 11/18/2018   PAD (peripheral artery disease) (HCC) 06/13/2018   Ischemic cerebrovascular accident (CVA) (HCC) 06/13/2018   Dysarthria 06/12/2018   Renal insufficiency 06/12/2018   Claudication (HCC) 01/11/2018   Anemia 10/10/2017    Orientation RESPIRATION BLADDER Height & Weight     Self, Time, Situation, Place  Normal Continent Weight:   Height:     BEHAVIORAL SYMPTOMS/MOOD NEUROLOGICAL BOWEL NUTRITION STATUS      Continent Diet (heart)  AMBULATORY STATUS COMMUNICATION OF NEEDS Skin   Limited Assist Verbally Other (Comment) (skin tear tibilal left posterior - foam dressing; redness)                       Personal Care Assistance Level of Assistance  Bathing, Feeding, Dressing Bathing Assistance: Limited assistance Feeding assistance: Limited assistance Dressing Assistance: Limited assistance     Functional Limitations Info  Sight Sight Info: Impaired        SPECIAL CARE FACTORS FREQUENCY  PT (By licensed PT), OT (By licensed OT), Speech therapy     PT Frequency: 5 times per week OT Frequency: 5 times per week     Speech Therapy Frequency: 3 times per week      Contractures      Additional Factors Info  Code Status, Allergies Code Status Info: full Allergies Info: Pork-derived Products, Shellfish Allergy           Current Medications (03/23/2024):  This is the current hospital  active medication list Current Facility-Administered Medications  Medication Dose Route Frequency Provider Last Rate Last Admin   [START ON 03/24/2024]  stroke: early stages of recovery book   Does not apply Once Burton Casey, MD       acetaminophen  (TYLENOL ) tablet 650 mg  650 mg Oral Q6H PRN Burton Casey, MD       amLODipine  (NORVASC ) tablet 5 mg  5 mg Oral Daily Arne Langdon, MD   5 mg at 03/23/24 9147    apixaban  (ELIQUIS ) tablet 5 mg  5 mg Oral BID Arne Langdon, MD   5 mg at 03/23/24 8295   atorvastatin  (LIPITOR ) tablet 80 mg  80 mg Oral Daily Arne Langdon, MD   80 mg at 03/23/24 6213   azelastine  (ASTELIN ) 0.1 % nasal spray 2 spray  2 spray Each Nare BID Arne Langdon, MD   2 spray at 03/23/24 0920   furosemide  (LASIX ) tablet 20 mg  20 mg Oral Daily Arne Langdon, MD   20 mg at 03/23/24 0865   gabapentin  (NEURONTIN ) capsule 300 mg  300 mg Oral QHS Arne Langdon, MD   300 mg at 03/22/24 2259   ipratropium-albuterol  (DUONEB) 0.5-2.5 (3) MG/3ML nebulizer solution 3 mL  3 mL Nebulization Q4H PRN Ghimire, Estil Heman, MD       LORazepam  (ATIVAN ) injection 1 mg  1 mg Intravenous Once PRN Burton Casey, MD       methimazole  (TAPAZOLE ) tablet 5 mg  5 mg Oral BID Ghimire, Shanker M, MD   5 mg at 03/23/24 1358   ondansetron  (ZOFRAN ) injection 4 mg  4 mg Intravenous Q6H PRN Burton Casey, MD       tamsulosin  (FLOMAX ) capsule 0.4 mg  0.4 mg Oral Daily Arne Langdon, MD   0.4 mg at 03/23/24 7846     Discharge Medications: Please see discharge summary for a list of discharge medications.  Relevant Imaging Results:  Relevant Lab Results:   Additional Information SS #: [id number]  Maliea Grandmaison E Jeffifer Rabold, LCSW

## 2024-03-23 NOTE — Consult Note (Addendum)
 Cardiology Consultation   Patient ID: Jesus Lam MRN: [id number]; DOB: 1933-05-11  Admit date: 03/22/2024 Date of Consult: 03/23/2024  PCP:  Susanna Epley, FNP   [facility] Providers Cardiologist:  Cody Das, MD     Patient Profile:   Jesus Lam is a 89 y.o. male with a hx of HTN, PAD, CVA, DVT, chronic HFrEF who is being seen 03/23/2024 for the evaluation of bradycardia at the request of Dr. Hilton Lucky.  History of Present Illness:   [name] is a 89 yo male with PMH noted above. He has been followed by Dr. Filiberto Hug as outpatient. Cardiac cath 12/2017 with non-obstructive CAD and known hx of severe bilateral PAD with class III claudication s/p DCB to RT SFA and overlapping stents to the L SFA at that time. Echo 05/2018 with LVEF of 25-30%, g1DD, mild AR, moderately dilated LA. Last seen in the office on 08/2023 and reported mild calf tenderness. Sent for outpatient dopplers which where positive for left DVT. He was already on Eliquis .   Presented to the ED on 4/28 with expressive aphasia and receptive aphasia.  It was reported at baseline he had stuttering speech from his prior CVA but without difficulties with speech comprehension or word finding.  Code stroke called in the field.  Labs showed sodium 142, potassium 3.9, creatinine 1.15, albumin 3.3, WBC 6.3, hemoglobin 11.7.  EKG showed sinus bradycardia, first-degree AV block, 58 bpm nonspecific changes.  CT head, CTA head and neck with no emergent large vessel occlusion, completed infarct in the superior left parotid cortex with acute left distal MCA branch ischemic infarction.  He was not felt to be a TNK candidate secondary to the time criteria nor candidate for thromboembolectomy due to no LVO on CTA.  Recommendations for permissive hypertension.   He has been on telemetry and noted to have episodes of bradycardia with rates into the upper 30s with sinus pauses. Review of telemetry shows 3.19 sec pause x2  at 1052, 3.55 sec pause at 1135 and 3.85 sec pause at 1217 today. Cardiology has been asked to evaluate.   In talking with the patient, he currently lives at home mostly independently. Reports he was in his usual state of health until the day of admission when he developed asphasia. He denies any chest pain, syncope, pre-syncope, light-headedness prior to admission.    Past Medical History:  Diagnosis Date   Anemia    BPH (benign prostatic hyperplasia)    Cataracts, bilateral    CVA (cerebral vascular accident) (HCC)    DVT (deep venous thrombosis) (HCC)    dvt in left leg   Dyslipidemia    Gout    Gout    HTN (hypertension)    Left hip pain 03/15/2020   Left leg pain    Osteoarthritis    PAD (peripheral artery disease) (HCC)    Stasis dermatitis    Stroke (HCC)    Vitamin D  deficiency     Past Surgical History:  Procedure Laterality Date   ABDOMINAL AORTOGRAM N/A 01/13/2018   Procedure: ABDOMINAL AORTOGRAM;  Surgeon: Cody Das, MD;  Location: [facility];  Service: Cardiovascular;  Laterality: N/A;   CATARACT EXTRACTION, BILATERAL     LEFT HEART CATH AND CORONARY ANGIOGRAPHY N/A 01/13/2018   Procedure: LEFT HEART CATH AND CORONARY ANGIOGRAPHY;  Surgeon: Cody Das, MD;  Location: [facility];  Service: Cardiovascular;  Laterality: N/A;   LOWER EXTREMITY ANGIOGRAPHY N/A 01/13/2018   Procedure:  LOWER EXTREMITY ANGIOGRAPHY;  Surgeon: Cody Das, MD;  Location: [facility];  Service: Cardiovascular;  Laterality: N/A;   LOWER EXTREMITY ANGIOGRAPHY Right 01/27/2018   Procedure: LOWER EXTREMITY ANGIOGRAPHY;  Surgeon: Knox Perl, MD;  Location: [facility];  Service: Cardiovascular;  Laterality: Right;   PERIPHERAL VASCULAR ATHERECTOMY Left 01/13/2018   Procedure: PERIPHERAL VASCULAR ATHERECTOMY;  Surgeon: Cody Das, MD;  Location: [facility];  Service: Cardiovascular;  Laterality: Left;  SFA WITH PTA DRUG COATED  BALLOON   PERIPHERAL VASCULAR INTERVENTION  01/27/2018   Procedure: PERIPHERAL VASCULAR INTERVENTION;  Surgeon: Knox Perl, MD;  Location: [facility];  Service: Cardiovascular;;    Inpatient Medications: Scheduled Meds:  [START ON 03/24/2024]  stroke: early stages of recovery book   Does not apply Once   amLODipine   5 mg Oral Daily   apixaban   5 mg Oral BID   atorvastatin   80 mg Oral Daily   azelastine   2 spray Each Nare BID   furosemide   20 mg Oral Daily   gabapentin   300 mg Oral QHS   methIMAzole   5 mg Oral BID   tamsulosin   0.4 mg Oral Daily   Continuous Infusions:  PRN Meds: acetaminophen , ipratropium-albuterol , ondansetron  (ZOFRAN ) IV  Allergies:    Allergies  Allergen Reactions   Pork-Derived Products Other (See Comments)    Religious    Shellfish Allergy Other (See Comments)    Gout     Social History:   Social History   Socioeconomic History   Marital status: Single    Spouse name: Not on file   Number of children: 3   Years of education: Not on file   Highest education level: Not on file  Occupational History   Occupation: retired  Tobacco Use   Smoking status: Former    Current packs/day: 0.00    Average packs/day: 0.3 packs/day for 0.5 years (0.1 ttl pk-yrs)    Types: Cigarettes    Start date: 05/1978    Quit date: 1980    Years since quitting: 45.3   Smokeless tobacco: Never  Vaping Use   Vaping status: Never Used  Substance and Sexual Activity   Alcohol use: Yes    Alcohol/week: 6.0 - 7.0 standard drinks of alcohol    Types: 6 - 7 Shots of liquor per week    Comment: on occasion   Drug use: Yes    Types: Marijuana    Comment: last used x 2 hours ago   Sexual activity: Yes  Other Topics Concern   Not on file  Social History Narrative   ** Merged History Encounter **       Lives with daughter, Leward Record. 2 daughters live here in [place]. Son lives in [place]. 3/16- offered information on local food pantries as well as congregate  meal sites. Information declined.   Social Drivers of Corporate investment banker Strain: Low Risk  (06/04/2023)   Overall Financial Resource Strain (CARDIA)    Difficulty of Paying Living Expenses: Not hard at all  Food Insecurity: No Food Insecurity (03/22/2024)   Hunger Vital Sign    Worried About Running Out of Food in the Last Year: Never true    Ran Out of Food in the Last Year: Never true  Transportation Needs: No Transportation Needs (03/22/2024)   PRAPARE - Administrator, Civil Service (Medical): No    Lack of Transportation (Non-Medical): No  Physical Activity: Sufficiently Active (06/04/2023)  Exercise Vital Sign    Days of Exercise per Week: 3 days    Minutes of Exercise per Session: 60 min  Recent Concern: Physical Activity - Insufficiently Active (06/04/2023)   Exercise Vital Sign    Days of Exercise per Week: 3 days    Minutes of Exercise per Session: 20 min  Stress: No Stress Concern Present (06/04/2023)   [employer] of Occupational Health - Occupational Stress Questionnaire    Feeling of Stress : Not at all  Social Connections: Moderately Integrated (03/22/2024)   Social Connection and Isolation Panel [NHANES]    Frequency of Communication with Friends and Family: More than three times a week    Frequency of Social Gatherings with Friends and Family: Three times a week    Attends Religious Services: 1 to 4 times per year    Active Member of Clubs or Organizations: Yes    Attends Banker Meetings: More than 4 times per year    Marital Status: Widowed  Intimate Partner Violence: Not At Risk (03/22/2024)   Humiliation, Afraid, Rape, and Kick questionnaire    Fear of Current or Ex-Partner: No    Emotionally Abused: No    Physically Abused: No    Sexually Abused: No    Family History:    Family History  Problem Relation Age of Onset   Cancer Mother    Cancer Father    Alcohol abuse Father    Breast cancer Daughter    Stroke  Daughter    CVA Daughter      ROS:  Please see the history of present illness.   All other ROS reviewed and negative.     Physical Exam/Data:   Vitals:   03/23/24 0318 03/23/24 0407 03/23/24 0420 03/23/24 2104  BP: (!) 140/55 (!) 189/97 (!) 140/74 (!) 143/77  Pulse: 67 68 65 63  Resp: 17 19 20 19   Temp:  98.1 F (36.7 C)  98.8 F (37.1 C)  TempSrc:  Oral  Oral  SpO2:  97%     No intake or output data in the 24 hours ending 03/23/24 2312     11/03/2023    3:10 PM 09/22/2023    8:54 AM 08/21/2023    2:13 PM  Last 3 Weights  Weight (lbs) 239 lb 3.2 oz 237 lb 237 lb 6.4 oz  Weight (kg) 108.5 kg 107.502 kg 107.684 kg     There is no height or weight on file to calculate BMI.  General:  Well nourished, well developed, in no acute distress. Stuttered speech HEENT: normal Neck: no JVD Vascular: No carotid bruits; Distal pulses 2+ bilaterally Cardiac:  normal S1, S2; RRR; no murmur  Lungs:  clear to auscultation bilaterally Abd: soft, nontender, no hepatomegaly  Ext: no edema Musculoskeletal:  No deformities Skin: warm and dry  Neuro: no focal abnormalities noted  EKG:  The EKG was personally reviewed and demonstrates:  Sinus bradycardia, 58bpm with 1st degree AVB Telemetry:  Telemetry was personally reviewed and demonstrates:  Sinus bradycardia, rates 40-50s-->3.19 sec pause x2 at 1052, 3.55 sec pause at 1135 and 3.85 sec pause at 1217 = Tele personally reviewed --> agree with findings.  Relevant CV Studies:  Echo: 2019  Study Conclusions   - Left ventricle: The cavity size was mildly dilated. Wall    thickness was increased increased in a pattern of mild to    moderate LVH. Systolic function was severely reduced. The    estimated ejection fraction was in  the range of 25% to 30%.    Diffuse hypokinesis. Doppler parameters are consistent with    abnormal left ventricular relaxation (grade 1 diastolic    dysfunction).  - Aortic valve: There was mild regurgitation.   - Left atrium: The atrium was moderately dilated.  - Right atrium: The atrium was mildly dilated.   Laboratory Data:  High Sensitivity Troponin:  No results for input(s): "TROPONINIHS" in the last 720 hours.   Chemistry Recent Labs  Lab 03/22/24 1033 03/22/24 1039  NA 142 145  K 3.9 3.9  CL 110 112*  CO2 22  --   GLUCOSE 106* 103*  BUN 16 17  CREATININE 1.15 1.20  CALCIUM  9.5  --   GFRNONAA >60  --   ANIONGAP 10  --     Recent Labs  Lab 03/22/24 1033  PROT 7.6  ALBUMIN 3.3*  AST 21  ALT 15  ALKPHOS 72  BILITOT 0.7   Lipids  Recent Labs  Lab 03/23/24 0836  CHOL 94  TRIG 112  HDL 23*  LDLCALC 49  CHOLHDL 4.1    Hematology Recent Labs  Lab 03/22/24 1033 03/22/24 1039  WBC 6.3  --   RBC 3.93*  --   HGB 11.7* 12.6*  HCT 36.8* 37.0*  MCV 93.6  --   MCH 29.8  --   MCHC 31.8  --   RDW 13.2  --   PLT 166  --    Thyroid   Recent Labs  Lab 03/23/24 0836  TSH <0.010*  FREET4 1.30*    BNPNo results for input(s): "BNP", "PROBNP" in the last 168 hours.  DDimer No results for input(s): "DDIMER" in the last 168 hours.   Radiology/Studies:  MR BRAIN WO CONTRAST Result Date: 03/23/2024 CLINICAL DATA:  Follow-up examination for stroke. EXAM: MRI HEAD WITHOUT CONTRAST TECHNIQUE: Multiplanar, multiecho pulse sequences of the brain and surrounding structures were obtained without intravenous contrast. COMPARISON:  Prior CTs from 03/22/2024. FINDINGS: Brain: Mild age-related cerebral atrophy. No significant cerebral white matter disease for age. Encephalomalacia and gliosis involving the left temporal occipital region, consistent with a chronic ischemic infarct. Just superior to this within the left parietal lobe, there is a confluent area of restricted diffusion involving the cortical subcortical aspect of the left parietal lobe, consistent with an evolving acute posterior left MCA distribution infarct. Area of infarction measures up to 6 cm in greatest diameter. No  significant regional mass effect. Single small focus of associated petechial hemorrhage without hemorrhagic transformation (series 7, image 18). Additionally, subtle 2 cm linear focus of diffusion signal abnormality involving the right cerebellum, consistent with a tiny acute ischemic infarct (series 3, image 18). No associated hemorrhage or mass effect. No other evidence for acute or subacute ischemia. Gray-white matter differentiation otherwise maintained. No acute or significant chronic intracranial blood products. No mass lesion, midline shift or mass effect. No hydrocephalus or extra-axial fluid collection. Pituitary gland within normal limits. Vascular: Major intracranial vascular flow voids are maintained. Skull and upper cervical spine: Craniocervical junction within normal limits. Bone marrow signal intensity normal. No scalp soft tissue abnormality. Sinuses/Orbits: Prior bilateral ocular lens replacement. Right maxillary sinus retention cyst. Paranasal sinuses are otherwise largely clear. No mastoid effusion. Other: None. IMPRESSION: 1. 6 cm evolving acute posterior left MCA distribution infarct involving the left parietal lobe. Single small focus of associated petechial hemorrhage without hemorrhagic transformation. No significant regional mass effect. 2. 2 cm acute ischemic nonhemorrhagic right cerebellar infarct. 3. Chronic left temporoccipital infarct. 4.  Underlying mild age-related cerebral atrophy. Electronically Signed   By: Virgia Griffins M.D.   On: 03/23/2024 19:26   CT ANGIO HEAD NECK W WO CM W PERF (CODE STROKE) Result Date: 03/22/2024 CLINICAL DATA:  Stroke workup EXAM: CT ANGIOGRAPHY HEAD AND NECK CT PERFUSION BRAIN TECHNIQUE: Multidetector CT imaging of the head and neck was performed using the standard protocol during bolus administration of intravenous contrast. Multiplanar CT image reconstructions and MIPs were obtained to evaluate the vascular anatomy. Carotid stenosis  measurements (when applicable) are obtained utilizing NASCET criteria, using the distal internal carotid diameter as the denominator. Multiphase CT imaging of the brain was performed following IV bolus contrast injection. Subsequent parametric perfusion maps were calculated using RAPID software. RADIATION DOSE REDUCTION: This exam was performed according to the departmental dose-optimization program which includes automated exposure control, adjustment of the mA and/or kV according to patient size and/or use of iterative reconstruction technique. CONTRAST:  OMNIPAQUE  IOHEXOL  350 MG/ML SOLN COMPARISON:  Noncontrast head CT from earlier today FINDINGS: CTA NECK FINDINGS Aortic arch: Extensive atheromatous plaque, 2 vessel branching Right carotid system: Moderate atheromatous plaque at the bifurcation without flow reducing stenosis, ulceration, or beading Left carotid system: Moderate atheromatous plaque at the bifurcation without stenosis, ulceration, or beading. Vertebral arteries: No proximal subclavian stenosis. Calcified plaque at the right vertebral origin causing mild-to-moderate stenosis. Robust flow in the left vertebral artery which is dominant. Skeleton: No acute finding Other neck: Goiter with asymmetric heterogeneous enlargement of the left lobe pattern unchanged from 2023 CT of the neck. Low-density nodule with calcification in the right lower internal jugular vein attributed to nonocclusive, chronic thrombus. Upper chest: No acute finding Review of the MIP images confirms the above findings CTA HEAD FINDINGS Anterior circulation: No major branch occlusion, beading, or proximal flow reducing stenosis. Mild atheromatous calcification at the cavernous carotids. Posterior circulation: The vertebral arteries show atheromatous calcification. No flow reducing stenosis seen in the vertebral or basilar circulation. No branch occlusion. Venous sinuses: Unremarkable Anatomic variants: None significant Review  of the MIP images confirms the above findings CT Brain Perfusion Findings: CBF (<30%) Volume: 11mL-this has a early CT correlate. Perfusion (Tmax>6.0s) volume: 21mL Mismatch Volume: 15mL Case discussed with Dr. Lindzen at time of dictation. IMPRESSION: No emergent large vessel occlusion. 6 cc acute, completed infarct in the superior left parietal cortex. There is 15 cc of adjacent penumbra intervening this infarct and a pre-existing left parietal infarct which is chronic. The affected peripheral branch vessel is not visualized. Atherosclerosis without flow reducing stenosis or embolic source seen in the proximal vasculature. Electronically Signed   By: Ronnette Coke M.D.   On: 03/22/2024 11:24   CT HEAD CODE STROKE WO CONTRAST` Result Date: 03/22/2024 CLINICAL DATA:  Code stroke. Neuro deficit, concern for stroke, aphasia. EXAM: CT HEAD WITHOUT CONTRAST TECHNIQUE: Contiguous axial images were obtained from the base of the skull through the vertex without intravenous contrast. RADIATION DOSE REDUCTION: This exam was performed according to the departmental dose-optimization program which includes automated exposure control, adjustment of the mA and/or kV according to patient size and/or use of iterative reconstruction technique. COMPARISON:  CT head 11/30/2020, MRI head 06/13/2018. FINDINGS: Brain: No acute intracranial hemorrhage. No CT evidence of acute infarct. Redemonstrated remote infarct involving the left temporal occipital cortex and subcortical white matter. Nonspecific hypoattenuation in the periventricular and subcortical white matter favored to reflect chronic microvascular ischemic changes. No edema, mass effect, or midline shift. The basilar cisterns are patent. Ventricles: Prominence of  the ventricles suggesting underlying parenchymal volume loss. Vascular: Atherosclerotic calcifications of the carotid siphons and intracranial vertebral arteries. No hyperdense vessel. Skull: No acute or aggressive  finding. Orbits: Orbits are symmetric. Sinuses: Mucous retention cyst in the right maxillary sinus. Other: Mastoid air cells are clear. 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 ASPECTS [facility] Stroke Program Early CT Score) - Ganglionic level infarction (caudate, lentiform nuclei, internal capsule, insula, M1-M3 cortex): 7 - Supraganglionic infarction (M4-M6 cortex): 3 Total score (0-10 with 10 being normal): 10 IMPRESSION: 1. No CT evidence of acute intracranial abnormality. 2. Redemonstrated remote infarct involving the left temporal occipital lobes. 3. Mild chronic microvascular ischemic changes and mild parenchymal volume loss. 4. ASPECTS is 10 These results were communicated to Dr. Lindzen At 11:07 am on 03/22/2024 by text page via the [facility] messaging system. Electronically Signed   By: Denny Flack M.D.   On: 03/22/2024 11:07     Assessment and Plan:   Adebowale Drayton is a 89 y.o. male with a hx of HTN, PAD, CVA, DVT who is being seen 03/23/2024 for the evaluation of bradycardia at the request of Dr. Hilton Lucky.  Bradycardia with sinus pauses -- in review of prior EKGs he has been noted to be bradycardic into the 50s. This admission he has had HRs as low at 39 briefly on telemetry with sinus pauses as noted above with longest being 3.85 seconds. He is asymptomatic with these and denies any symptoms prior to admission. Was on bisoprolol /Aricept  prior to admission which has been held.  = Recommend continue to hold any AV nodal agents such as beta-blockers, calcium  channel blocker or digoxin. -- echo pending -- TSH suppressed on admission -- given he is asymptomatic, seems reasonable to continue watchful waiting and likely cardiac monitor at discharge  => No acute need for PPM placement at this time but will review with EP.  I agree that the best option will be outpatient 30-day Preventice monitor to evaluate for pauses and/or A-fib.  Acute CVA -- Presented with  expressive/receptive aphasia.  CT head with acute left distal MCA branch ischemic infarct.  MRI pending -- Neurology following, currently continued on home Eliquis  (reports he has missed a few dose of Eliquis  prior to admission) => cannot exclude possibility of A-fib as etiology for CVA especially in the setting of having missed a few doses of Eliquis .  Hx of DVT -- on Eliquis   Hx of NICM -- cardiac cath 2019 with mild CAD -- echo 2019 with LVEF of 25-30%, g1dd -- does not appear volume overloaded on exam -- repeat echo pending  Hyperthyroidism -- TSH suppressed on admission, free T4 elevated -- started on Tapazole  per primary  PAD s/p left SFA stenting '19 -- continue statin   For questions or updates, please contact [facility] Please consult [email or web address] for contact info under    Signed, Johnie Nailer, NP  03/23/2024 1:48 PM  ATTENDING ATTESTATION  I have seen, examined and evaluated the patient this evening along with Johnie Nailer, NP after reviewing all the available data and chart, we discussed the patients laboratory, study & physical findings as well as symptoms in detail.  I agree with her findings, examination as well as impression recommendations as per our discussion.    Attending adjustments noted in italics.   Admitted with CVA, cardiology consulted due to intermittent bradycardia with 2 pauses of roughly 3 seconds they were asymptomatic.  Is possible that he was actually sleeping when these episodes occurred.  No urgent requirement for pacemaker but need to continue to monitor and avoid AV nodal agents.  Beta-blocker currently being held.  Agree with recommendation for 30-day Preventice monitor on discharge  Will follow telemetry to evaluate for additional pauses and will review with EP    Arleen Lacer, MD, MS Randene Bustard, M.D., M.S. Interventional Cardiologist  [facility]  Pager # [phone] Phone # [phone] [address] [place], [place] [address]    Randene Bustard, MD  03/23/2024 11:12 PM

## 2024-03-23 NOTE — Progress Notes (Signed)
  Inpatient Rehabilitation Admissions Coordinator   Met with patient and daughter, Eldonna Greenspan at bedside and spoke with daughter, Leward Record by phone. We discussed goals and expectations of a possible CIR admit. Eldonna Greenspan is pending travel to [place] for 1 month. Leward Record to come today to discuss with her Dad his preference. Patient lives alone and would need 24/7 caregiver supports if CIR 10 to 12 days stay would be pursued. They will determine if CIR vs SNF preferred. Please call me with any questions.   Jeannetta Millman, RN, MSN Rehab Admissions Coordinator [phone]

## 2024-03-24 ENCOUNTER — Inpatient Hospital Stay (HOSPITAL_COMMUNITY)

## 2024-03-24 DIAGNOSIS — T45516A Underdosing of anticoagulants, initial encounter: Secondary | ICD-10-CM

## 2024-03-24 DIAGNOSIS — I63441 Cerebral infarction due to embolism of right cerebellar artery: Secondary | ICD-10-CM | POA: Diagnosis not present

## 2024-03-24 DIAGNOSIS — R4701 Aphasia: Secondary | ICD-10-CM | POA: Diagnosis not present

## 2024-03-24 DIAGNOSIS — I429 Cardiomyopathy, unspecified: Secondary | ICD-10-CM | POA: Diagnosis not present

## 2024-03-24 DIAGNOSIS — E785 Hyperlipidemia, unspecified: Secondary | ICD-10-CM | POA: Diagnosis not present

## 2024-03-24 DIAGNOSIS — I10 Essential (primary) hypertension: Secondary | ICD-10-CM | POA: Diagnosis not present

## 2024-03-24 DIAGNOSIS — I6389 Other cerebral infarction: Secondary | ICD-10-CM | POA: Diagnosis not present

## 2024-03-24 DIAGNOSIS — I63512 Cerebral infarction due to unspecified occlusion or stenosis of left middle cerebral artery: Secondary | ICD-10-CM | POA: Diagnosis not present

## 2024-03-24 DIAGNOSIS — I619 Nontraumatic intracerebral hemorrhage, unspecified: Secondary | ICD-10-CM | POA: Diagnosis not present

## 2024-03-24 LAB — ECHOCARDIOGRAM COMPLETE
AR max vel: 1.48 cm2
AV Area VTI: 1.41 cm2
AV Area mean vel: 1.4 cm2
AV Mean grad: 12 mmHg
AV Peak grad: 25.2 mmHg
Ao pk vel: 2.51 m/s
Area-P 1/2: 2.17 cm2
Calc EF: 51.4 %
S' Lateral: 3.6 cm
Single Plane A2C EF: 53.8 %
Single Plane A4C EF: 48.3 %

## 2024-03-24 NOTE — Progress Notes (Signed)
 Physical Therapy Treatment Patient Details Name: Jesus Lam MRN: [id number] DOB: Jan 22, 1933 Today's Date: 03/24/2024   History of Present Illness Patient is a 89 y.o.  male admitted 4/28 presented with new onset expressive aphasia.   Left superior parietal infarct.  PMH: HTN, HLD, VTE on Eliquis , CVA 2019    PT Comments  Pt admitted with above diagnosis. Pt able to progress distance today with pt walking with chair follow however able to make it back to room. Needs min assist and cues for safety as he has has poor coordination of right hemibody which worsens as he fatigues. Pt will need post acute rehab < 3 hours day to return to prior functional level. Pt and daughters agree.  Pt currently with functional limitations due to the deficits listed below (see PT Problem List). Pt will benefit from acute skilled PT to increase their independence and safety with mobility to allow discharge.       If plan is discharge home, recommend the following: Assistance with cooking/housework;Assist for transportation;Help with stairs or ramp for entrance;Supervision due to cognitive status   Can travel by private vehicle     No  Equipment Recommendations  Other (comment) (TBA at next venue)    Recommendations for Other Services       Precautions / Restrictions Precautions Precautions: Fall Restrictions Weight Bearing Restrictions Per Provider Order: No     Mobility  Bed Mobility Overal bed mobility: Needs Assistance Bed Mobility: Supine to Sit, Sit to Supine     Supine to sit: Contact guard Sit to supine: Contact guard assist   General bed mobility comments: Cues and guidance but no physical assist. Noted decr coordination of right hemibody with pt using momentum.    Transfers Overall transfer level: Needs assistance Equipment used: Rolling walker (2 wheels) Transfers: Sit to/from Stand, Bed to chair/wheelchair/BSC Sit to Stand: Min assist, From elevated surface           General  transfer comment: Pt was able to stand to RW wtih min assist and cues. Pt needing cues to place right UE on RW as he remains with decr awarness of right UE.    Ambulation/Gait Ambulation/Gait assistance: Min assist, +2 safety/equipment Gait Distance (Feet): 350 Feet Assistive device: Rolling walker (2 wheels) Gait Pattern/deviations: Step-through pattern, Decreased step length - right, Decreased stride length, Ataxic, Drifts right/left, Trunk flexed, Wide base of support   Gait velocity interpretation: <1.31 ft/sec, indicative of household ambulator   General Gait Details: Pt with uncoordinated gait overall with assist and cues for safety. Pt needed assist and cues to move RW, to stay close to RW and for sequencing steps and RW. Pt with very poor safety awareness needing constant cues and assist. Pt with poor coordination of right LE and constant cues to hold right UE on RW.  did incr distance, followed with chair for pt safety however pt never needed to sit down and rest.   Stairs             Wheelchair Mobility     Tilt Bed    Modified Rankin (Stroke Patients Only) Modified Rankin (Stroke Patients Only) Pre-Morbid Rankin Score: No symptoms Modified Rankin: Moderately severe disability     Balance Overall balance assessment: Needs assistance Sitting-balance support: No upper extremity supported, Feet supported Sitting balance-Leahy Scale: Good Sitting balance - Comments: unsupported EOB   Standing balance support: Bilateral upper extremity supported, During functional activity Standing balance-Leahy Scale: Poor Standing balance comment: relies on UE support and  external support                            Communication Communication Communication: Impaired Factors Affecting Communication: Difficulty expressing self;Reduced clarity of speech;Other (comment) (stuttering speech)  Cognition Arousal: Alert Behavior During Therapy: Impulsive   PT - Cognitive  impairments: Orientation, Awareness, Attention, Initiation, Sequencing, Problem solving, Safety/Judgement   Orientation impairments: Situation, Place                     Following commands: Impaired Following commands impaired: Follows one step commands with increased time    Cueing Cueing Techniques: Verbal cues, Tactile cues  Exercises      General Comments General comments (skin integrity, edema, etc.): VSS.  Pt wanted to wash dentures therefore this PT assisted with set up.  Pt continues to not be able to use right UE for tasks.  Pt also possibly apraxic as he kept initiating to put toothpaste into mouth instead of on tooth brush. Pt had to use left hand for brushing and right hand to hold the dentures and he would drop them as well neeidng asisst to complete the task.      Pertinent Vitals/Pain Pain Assessment Pain Assessment: No/denies pain    Home Living                          Prior Function            PT Goals (current goals can now be found in the care plan section) Progress towards PT goals: Progressing toward goals    Frequency    Min 3X/week      PT Plan      Co-evaluation              AM-PAC PT "6 Clicks" Mobility   Outcome Measure  Help needed turning from your back to your side while in a flat bed without using bedrails?: A Little Help needed moving from lying on your back to sitting on the side of a flat bed without using bedrails?: A Little Help needed moving to and from a bed to a chair (including a wheelchair)?: A Lot Help needed standing up from a chair using your arms (e.g., wheelchair or bedside chair)?: A Little Help needed to walk in hospital room?: A Lot Help needed climbing 3-5 steps with a railing? : A Lot 6 Click Score: 15    End of Session Equipment Utilized During Treatment: Gait belt Activity Tolerance: Patient limited by fatigue Patient left: in chair;with call bell/phone within reach;with chair alarm  set;with family/visitor present Nurse Communication: Mobility status PT Visit Diagnosis: Unsteadiness on feet (R26.81);Other abnormalities of gait and mobility (R26.89);Muscle weakness (generalized) (M62.81);Hemiplegia and hemiparesis Hemiplegia - Right/Left: Right Hemiplegia - dominant/non-dominant: Dominant Hemiplegia - caused by: Cerebral infarction     Time: 1001-1038 PT Time Calculation (min) (ACUTE ONLY): 37 min  Charges:    $Gait Training: 8-22 mins $Self Care/Home Management: 8-22 PT General Charges $$ ACUTE PT VISIT: 1 Visit                     Gracynn Rajewski M,PT Acute Rehab Services [phone]    Florencia Hunter 03/24/2024, 11:38 AM

## 2024-03-24 NOTE — Progress Notes (Signed)
  Echocardiogram 2D Echocardiogram has been performed.  Annis Kinder, RDCS 03/24/2024, 11:40 AM

## 2024-03-24 NOTE — TOC Progression Note (Signed)
 Transition of Care [facility]) - Progression Note    Patient Details  Name: Jesus Lam MRN: [id number] Date of Birth: [birth year]  [facility] CM/SW Contact  Jannice Mends, LCSW Phone Number: 03/24/2024, 8:47 AM  Clinical Narrative:    CSW notified that family has declined CIR placement in favor of SNF. CSW spoke with patient's daughter, Leward Record, and emailed her the SNF bed offers and Medicare ratings list for review. Patient will require insurance approval.  Skilled Nursing Rehab Facilities-   [email or web address] Ratings out of 5 stars (5 the highest)  Name Address  Phone # Quality Care Staffing Health Inspection Overall  [facility] [address], [place] [phone] 1 4 4   [facility] [address], [facility] [phone] 2 2 1 1   [facility] [address] [facility] [phone] 2 2 2   [facility] [address], [place] [phone] 5 1 2 2   [facility] [address], [place] [phone] 2 1 3 2   [facility] [address], [place] [phone] 3 3 2 2      Expected Discharge Plan: IP Rehab Facility Barriers to Discharge: Continued Medical Work up  Expected Discharge Plan and Services       Living arrangements for the past 2 months: Single Family Home                                       Social Determinants of Health (SDOH) Interventions SDOH Screenings   Food Insecurity: No Food Insecurity (03/22/2024)  Housing: Low Risk  (03/22/2024)  Transportation Needs: No Transportation Needs (03/22/2024)  Utilities: Not At Risk (03/22/2024)  Alcohol Screen: Low Risk  (06/04/2023)  Depression (PHQ2-9): Low Risk  (08/21/2023)  Financial Resource Strain: Low Risk  (06/04/2023)  Physical Activity: Sufficiently Active (06/04/2023)  Recent Concern: Physical Activity - Insufficiently Active (06/04/2023)  Social Connections: Moderately Integrated (03/22/2024)   Stress: No Stress Concern Present (06/04/2023)  Tobacco Use: Medium Risk (03/22/2024)    Readmission Risk Interventions     No data to display

## 2024-03-24 NOTE — Progress Notes (Signed)
  Inpatient Rehabilitation Admissions Coordinator   I spoke with daughter, Leward Record , by phone. Family wishes to pursue SNF for longer recovery and they can not provide 24/7 at this time. Acute team and TOC made aware. We will sign off.  Jeannetta Millman, RN, MSN Rehab Admissions Coordinator [phone] 03/24/2024 8:26 AM

## 2024-03-24 NOTE — Plan of Care (Signed)
  Problem: Education: Goal: Knowledge of General Education information will improve Description: Including pain rating scale, medication(s)/side effects and non-pharmacologic comfort measures Outcome: Progressing   Problem: Health Behavior/Discharge Planning: Goal: Ability to manage health-related needs will improve Outcome: Progressing   Problem: Nutrition: Goal: Adequate nutrition will be maintained Outcome: Progressing   Problem: Coping: Goal: Level of anxiety will decrease Outcome: Progressing   Problem: Elimination: Goal: Will not experience complications related to bowel motility Outcome: Progressing Goal: Will not experience complications related to urinary retention Outcome: Progressing   Problem: Safety: Goal: Ability to remain free from injury will improve Outcome: Progressing   Problem: Skin Integrity: Goal: Risk for impaired skin integrity will decrease Outcome: Progressing   Problem: Education: Goal: Knowledge of disease or condition will improve Outcome: Progressing   Problem: Education: Goal: Knowledge of secondary prevention will improve (MUST DOCUMENT ALL) Outcome: Progressing   Problem: Education: Goal: Knowledge of patient specific risk factors will improve (DELETE if not current risk factor) Outcome: Progressing   Problem: Ischemic Stroke/TIA Tissue Perfusion: Goal: Complications of ischemic stroke/TIA will be minimized Outcome: Progressing   Problem: Coping: Goal: Will identify appropriate support needs Outcome: Progressing   Problem: Self-Care: Goal: Ability to participate in self-care as condition permits will improve Outcome: Progressing   Problem: Self-Care: Goal: Ability to communicate needs accurately will improve Outcome: Progressing

## 2024-03-24 NOTE — TOC Progression Note (Addendum)
 Transition of Care [facility]) - Progression Note    Patient Details  Name: Jesus Lam MRN: [id number] Date of Birth: 1933/11/25  [facility] CM/SW Contact  Jannice Mends, LCSW Phone Number: 03/24/2024, 2:56 PM  Clinical Narrative:    CSW spoke with patient's daughter to update her on improved therapy note. CSW will submit for SNF authorization though cautioned it may be denied. She asked if CIR could accept patient if it is denied with family assistance after. CSW will inquire. She asked if patient's PCS hours could be increased and CSW explained that he likely would not meet criteria for increased hours.  She requested 1-[facility] and 2-[facility]. CSW initiated insurance authorization pending a facility choice (awaiting response from [facility]), Ref# [id number].   Expected Discharge Plan: IP Rehab Facility Barriers to Discharge: Continued Medical Work up  Expected Discharge Plan and Services       Living arrangements for the past 2 months: Single Family Home                                       Social Determinants of Health (SDOH) Interventions SDOH Screenings   Food Insecurity: No Food Insecurity (03/22/2024)  Housing: Low Risk  (03/22/2024)  Transportation Needs: No Transportation Needs (03/22/2024)  Utilities: Not At Risk (03/22/2024)  Alcohol Screen: Low Risk  (06/04/2023)  Depression (PHQ2-9): Low Risk  (08/21/2023)  Financial Resource Strain: Low Risk  (06/04/2023)  Physical Activity: Sufficiently Active (06/04/2023)  Recent Concern: Physical Activity - Insufficiently Active (06/04/2023)  Social Connections: Moderately Integrated (03/22/2024)  Stress: No Stress Concern Present (06/04/2023)  Tobacco Use: Medium Risk (03/22/2024)    Readmission Risk Interventions     No data to display

## 2024-03-24 NOTE — Progress Notes (Signed)
 STROKE TEAM PROGRESS NOTE   SUBJECTIVE (INTERVAL HISTORY) His daughter is at the bedside.  Overall his condition is gradually improving.  He still has some mild expressive aphasia but follows all commands.  Per daughter and patient that he had medication pouches and taking at home by himself.  However, daughter found that patient spilled medication all over with all the pouches open.  Not sure whether patient compliant with Eliquis .   OBJECTIVE Temp:  [97.5 F (36.4 C)-99.1 F (37.3 C)] 98.4 F (36.9 C) (04/30 1638) Pulse Rate:  [59-71] 71 (04/30 1638) Cardiac Rhythm: Normal sinus rhythm;Bundle branch block;Heart block (04/30 0700) Resp:  [19-20] 20 (04/30 1638) BP: (143-159)/(69-84) 150/84 (04/30 1638) SpO2:  [97 %-98 %] 98 % (04/30 1638)  Recent Labs  Lab 03/22/24 1025  GLUCAP 94   Recent Labs  Lab 03/22/24 1033 03/22/24 1039  NA 142 145  K 3.9 3.9  CL 110 112*  CO2 22  --   GLUCOSE 106* 103*  BUN 16 17  CREATININE 1.15 1.20  CALCIUM  9.5  --    Recent Labs  Lab 03/22/24 1033  AST 21  ALT 15  ALKPHOS 72  BILITOT 0.7  PROT 7.6  ALBUMIN 3.3*   Recent Labs  Lab 03/22/24 1033 03/22/24 1039  WBC 6.3  --   NEUTROABS 3.2  --   HGB 11.7* 12.6*  HCT 36.8* 37.0*  MCV 93.6  --   PLT 166  --    No results for input(s): "CKTOTAL", "CKMB", "CKMBINDEX", "TROPONINI" in the last 168 hours. No results for input(s): "LABPROT", "INR" in the last 72 hours. No results for input(s): "COLORURINE", "LABSPEC", "PHURINE", "GLUCOSEU", "HGBUR", "BILIRUBINUR", "KETONESUR", "PROTEINUR", "UROBILINOGEN", "NITRITE", "LEUKOCYTESUR" in the last 72 hours.  Invalid input(s): "APPERANCEUR"     Component Value Date/Time   CHOL 94 03/23/2024 0836   CHOL 99 (L) 11/03/2023 1549   TRIG 112 03/23/2024 0836   HDL 23 (L) 03/23/2024 0836   HDL 29 (L) 11/03/2023 1549   CHOLHDL 4.1 03/23/2024 0836   VLDL 22 03/23/2024 0836   LDLCALC 49 03/23/2024 0836   LDLCALC 40 11/03/2023 1549   Lab  Results  Component Value Date   HGBA1C 4.8 03/23/2024      Component Value Date/Time   LABOPIA NONE DETECTED 07/28/2022 0013   COCAINSCRNUR NONE DETECTED 07/28/2022 0013   LABBENZ NONE DETECTED 07/28/2022 0013   AMPHETMU NONE DETECTED 07/28/2022 0013   THCU POSITIVE (A) 07/28/2022 0013   LABBARB NONE DETECTED 07/28/2022 0013    No results for input(s): "ETH" in the last 168 hours.  I have personally reviewed the radiological images below and agree with the radiology interpretations.  ECHOCARDIOGRAM COMPLETE Result Date: 03/24/2024    ECHOCARDIOGRAM REPORT   Patient Name:   SANCHEZ PEEBLES Date of Exam: 03/24/2024 Medical Rec #:  [id number]       Height:       72.0 in Accession #:    [id number]      Weight:       239.2 lb Date of Birth:  1933/06/12        BSA:          2.299 m Patient Age:    89 years        BP:           159/84 mmHg Patient Gender: M               HR:  51 bpm. Exam Location:  Inpatient Procedure: 2D Echo, Color Doppler and Cardiac Doppler (Both Spectral and Color            Flow Doppler were utilized during procedure). Indications:    Stroke  History:        Patient has prior history of Echocardiogram examinations, most                 recent 06/13/2018.  Sonographer:    Andrena Bang Referring Phys: Mayo Speck Estil Heman GHIMIRE IMPRESSIONS  1. Left ventricular ejection fraction, by estimation, is 45 to 50%. The left ventricle has mildly decreased function. The left ventricle demonstrates global hypokinesis. There is moderate concentric left ventricular hypertrophy. Left ventricular diastolic parameters are consistent with Grade I diastolic dysfunction (impaired relaxation).  2. Right ventricular systolic function is normal. The right ventricular size is normal. There is normal pulmonary artery systolic pressure.  3. Left atrial size was moderately dilated.  4. The mitral valve is normal in structure. Trivial mitral valve regurgitation. No evidence of mitral stenosis.  5. The aortic  valve is calcified. There is mild calcification of the aortic valve. There is mild thickening of the aortic valve. Aortic valve regurgitation is mild to moderate. Mild aortic valve stenosis. Aortic valve area, by VTI measures 1.41 cm. Aortic valve mean gradient measures 12.0 mmHg. Aortic valve Vmax measures 2.51 m/s.  6. Aortic dilatation noted. There is mild dilatation of the ascending aorta, measuring 41 mm.  7. The inferior vena cava is normal in size with greater than 50% respiratory variability, suggesting right atrial pressure of 3 mmHg. FINDINGS  Left Ventricle: Left ventricular ejection fraction, by estimation, is 45 to 50%. The left ventricle has mildly decreased function. The left ventricle demonstrates global hypokinesis. The left ventricular internal cavity size was normal in size. There is  moderate concentric left ventricular hypertrophy. Left ventricular diastolic parameters are consistent with Grade I diastolic dysfunction (impaired relaxation). Indeterminate filling pressures. Right Ventricle: The right ventricular size is normal. No increase in right ventricular wall thickness. Right ventricular systolic function is normal. There is normal pulmonary artery systolic pressure. The tricuspid regurgitant velocity is 1.78 m/s, and  with an assumed right atrial pressure of 3 mmHg, the estimated right ventricular systolic pressure is 15.7 mmHg. Left Atrium: Left atrial size was moderately dilated. Right Atrium: Right atrial size was normal in size. Pericardium: There is no evidence of pericardial effusion. Mitral Valve: The mitral valve is normal in structure. Trivial mitral valve regurgitation. No evidence of mitral valve stenosis. Tricuspid Valve: The tricuspid valve is normal in structure. Tricuspid valve regurgitation is trivial. No evidence of tricuspid stenosis. Aortic Valve: The aortic valve is calcified. There is mild calcification of the aortic valve. There is mild thickening of the aortic  valve. Aortic valve regurgitation is mild to moderate. Mild aortic stenosis is present. Aortic valve mean gradient measures 12.0 mmHg. Aortic valve peak gradient measures 25.2 mmHg. Aortic valve area, by VTI measures 1.41 cm. Pulmonic Valve: The pulmonic valve was normal in structure. Pulmonic valve regurgitation is not visualized. No evidence of pulmonic stenosis. Aorta: Aortic dilatation noted. There is mild dilatation of the ascending aorta, measuring 41 mm. Venous: The inferior vena cava is normal in size with greater than 50% respiratory variability, suggesting right atrial pressure of 3 mmHg. IAS/Shunts: No atrial level shunt detected by color flow Doppler.  LEFT VENTRICLE PLAX 2D LVIDd:         4.40 cm  Diastology LVIDs:         3.60 cm      LV e' medial:    4.13 cm/s LV PW:         1.40 cm      LV E/e' medial:  14.3 LV IVS:        1.60 cm      LV e' lateral:   5.66 cm/s LVOT diam:     2.40 cm      LV E/e' lateral: 10.5 LV SV:         71 LV SV Index:   31 LVOT Area:     4.52 cm  LV Volumes (MOD) LV vol d, MOD A2C: 221.0 ml LV vol d, MOD A4C: 230.0 ml LV vol s, MOD A2C: 102.0 ml LV vol s, MOD A4C: 119.0 ml LV SV MOD A2C:     119.0 ml LV SV MOD A4C:     230.0 ml LV SV MOD BP:      116.7 ml RIGHT VENTRICLE RV S prime:     8.81 cm/s TAPSE (M-mode): 1.4 cm LEFT ATRIUM              Index LA diam:        4.10 cm  1.78 cm/m LA Vol (A2C):   110.0 ml 47.85 ml/m LA Vol (A4C):   55.2 ml  24.01 ml/m LA Biplane Vol: 78.2 ml  34.02 ml/m  AORTIC VALVE AV Area (Vmax):    1.48 cm AV Area (Vmean):   1.40 cm AV Area (VTI):     1.41 cm AV Vmax:           251.00 cm/s AV Vmean:          158.000 cm/s AV VTI:            0.508 m AV Peak Grad:      25.2 mmHg AV Mean Grad:      12.0 mmHg LVOT Vmax:         82.00 cm/s LVOT Vmean:        49.000 cm/s LVOT VTI:          0.158 m LVOT/AV VTI ratio: 0.31  AORTA Ao Asc diam: 4.10 cm MITRAL VALVE               TRICUSPID VALVE MV Area (PHT): 2.17 cm    TR Peak grad:   12.7 mmHg MV  Decel Time: 349 msec    TR Vmax:        178.00 cm/s MV E velocity: 59.20 cm/s MV A velocity: 91.70 cm/s  SHUNTS MV E/A ratio:  0.65        Systemic VTI:  0.16 m                            Systemic Diam: 2.40 cm Maudine Sos MD Electronically signed by Maudine Sos MD Signature Date/Time: 03/24/2024/5:27:41 PM    Final    MR BRAIN WO CONTRAST Result Date: 03/23/2024 CLINICAL DATA:  Follow-up examination for stroke. EXAM: MRI HEAD WITHOUT CONTRAST TECHNIQUE: Multiplanar, multiecho pulse sequences of the brain and surrounding structures were obtained without intravenous contrast. COMPARISON:  Prior CTs from 03/22/2024. FINDINGS: Brain: Mild age-related cerebral atrophy. No significant cerebral white matter disease for age. Encephalomalacia and gliosis involving the left temporal occipital region, consistent with a chronic ischemic infarct. Just superior to this within the left parietal lobe, there is a confluent area  of restricted diffusion involving the cortical subcortical aspect of the left parietal lobe, consistent with an evolving acute posterior left MCA distribution infarct. Area of infarction measures up to 6 cm in greatest diameter. No significant regional mass effect. Single small focus of associated petechial hemorrhage without hemorrhagic transformation (series 7, image 18). Additionally, subtle 2 cm linear focus of diffusion signal abnormality involving the right cerebellum, consistent with a tiny acute ischemic infarct (series 3, image 18). No associated hemorrhage or mass effect. No other evidence for acute or subacute ischemia. Gray-white matter differentiation otherwise maintained. No acute or significant chronic intracranial blood products. No mass lesion, midline shift or mass effect. No hydrocephalus or extra-axial fluid collection. Pituitary gland within normal limits. Vascular: Major intracranial vascular flow voids are maintained. Skull and upper cervical spine: Craniocervical junction  within normal limits. Bone marrow signal intensity normal. No scalp soft tissue abnormality. Sinuses/Orbits: Prior bilateral ocular lens replacement. Right maxillary sinus retention cyst. Paranasal sinuses are otherwise largely clear. No mastoid effusion. Other: None. IMPRESSION: 1. 6 cm evolving acute posterior left MCA distribution infarct involving the left parietal lobe. Single small focus of associated petechial hemorrhage without hemorrhagic transformation. No significant regional mass effect. 2. 2 cm acute ischemic nonhemorrhagic right cerebellar infarct. 3. Chronic left temporoccipital infarct. 4. Underlying mild age-related cerebral atrophy. Electronically Signed   By: Virgia Griffins M.D.   On: 03/23/2024 19:26   CT ANGIO HEAD NECK W WO CM W PERF (CODE STROKE) Result Date: 03/22/2024 CLINICAL DATA:  Stroke workup EXAM: CT ANGIOGRAPHY HEAD AND NECK CT PERFUSION BRAIN TECHNIQUE: Multidetector CT imaging of the head and neck was performed using the standard protocol during bolus administration of intravenous contrast. Multiplanar CT image reconstructions and MIPs were obtained to evaluate the vascular anatomy. Carotid stenosis measurements (when applicable) are obtained utilizing NASCET criteria, using the distal internal carotid diameter as the denominator. Multiphase CT imaging of the brain was performed following IV bolus contrast injection. Subsequent parametric perfusion maps were calculated using RAPID software. RADIATION DOSE REDUCTION: This exam was performed according to the departmental dose-optimization program which includes automated exposure control, adjustment of the mA and/or kV according to patient size and/or use of iterative reconstruction technique. CONTRAST:  OMNIPAQUE  IOHEXOL  350 MG/ML SOLN COMPARISON:  Noncontrast head CT from earlier today FINDINGS: CTA NECK FINDINGS Aortic arch: Extensive atheromatous plaque, 2 vessel branching Right carotid system: Moderate atheromatous  plaque at the bifurcation without flow reducing stenosis, ulceration, or beading Left carotid system: Moderate atheromatous plaque at the bifurcation without stenosis, ulceration, or beading. Vertebral arteries: No proximal subclavian stenosis. Calcified plaque at the right vertebral origin causing mild-to-moderate stenosis. Robust flow in the left vertebral artery which is dominant. Skeleton: No acute finding Other neck: Goiter with asymmetric heterogeneous enlargement of the left lobe pattern unchanged from 2023 CT of the neck. Low-density nodule with calcification in the right lower internal jugular vein attributed to nonocclusive, chronic thrombus. Upper chest: No acute finding Review of the MIP images confirms the above findings CTA HEAD FINDINGS Anterior circulation: No major branch occlusion, beading, or proximal flow reducing stenosis. Mild atheromatous calcification at the cavernous carotids. Posterior circulation: The vertebral arteries show atheromatous calcification. No flow reducing stenosis seen in the vertebral or basilar circulation. No branch occlusion. Venous sinuses: Unremarkable Anatomic variants: None significant Review of the MIP images confirms the above findings CT Brain Perfusion Findings: CBF (<30%) Volume: 71mL-this has a early CT correlate. Perfusion (Tmax>6.0s) volume: 21mL Mismatch Volume: 15mL Case discussed with  Dr. Lindzen at time of dictation. IMPRESSION: No emergent large vessel occlusion. 6 cc acute, completed infarct in the superior left parietal cortex. There is 15 cc of adjacent penumbra intervening this infarct and a pre-existing left parietal infarct which is chronic. The affected peripheral branch vessel is not visualized. Atherosclerosis without flow reducing stenosis or embolic source seen in the proximal vasculature. Electronically Signed   By: Ronnette Coke M.D.   On: 03/22/2024 11:24   CT HEAD CODE STROKE WO CONTRAST` Result Date: 03/22/2024 CLINICAL DATA:  Code  stroke. Neuro deficit, concern for stroke, aphasia. EXAM: CT HEAD WITHOUT CONTRAST TECHNIQUE: Contiguous axial images were obtained from the base of the skull through the vertex without intravenous contrast. RADIATION DOSE REDUCTION: This exam was performed according to the departmental dose-optimization program which includes automated exposure control, adjustment of the mA and/or kV according to patient size and/or use of iterative reconstruction technique. COMPARISON:  CT head 11/30/2020, MRI head 06/13/2018. FINDINGS: Brain: No acute intracranial hemorrhage. No CT evidence of acute infarct. Redemonstrated remote infarct involving the left temporal occipital cortex and subcortical white matter. Nonspecific hypoattenuation in the periventricular and subcortical white matter favored to reflect chronic microvascular ischemic changes. No edema, mass effect, or midline shift. The basilar cisterns are patent. Ventricles: Prominence of the ventricles suggesting underlying parenchymal volume loss. Vascular: Atherosclerotic calcifications of the carotid siphons and intracranial vertebral arteries. No hyperdense vessel. Skull: No acute or aggressive finding. Orbits: Orbits are symmetric. Sinuses: Mucous retention cyst in the right maxillary sinus. Other: Mastoid air cells are clear. 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 ASPECTS [facility] Stroke Program Early CT Score) - Ganglionic level infarction (caudate, lentiform nuclei, internal capsule, insula, M1-M3 cortex): 7 - Supraganglionic infarction (M4-M6 cortex): 3 Total score (0-10 with 10 being normal): 10 IMPRESSION: 1. No CT evidence of acute intracranial abnormality. 2. Redemonstrated remote infarct involving the left temporal occipital lobes. 3. Mild chronic microvascular ischemic changes and mild parenchymal volume loss. 4. ASPECTS is 10 These results were communicated to Dr. Lindzen At 11:07 am on 03/22/2024 by text page via the [facility]  messaging system. Electronically Signed   By: Denny Flack M.D.   On: 03/22/2024 11:07     PHYSICAL EXAM  Temp:  [97.5 F (36.4 C)-99.1 F (37.3 C)] 98.4 F (36.9 C) (04/30 1638) Pulse Rate:  [59-71] 71 (04/30 1638) Resp:  [19-20] 20 (04/30 1638) BP: (143-159)/(69-84) 150/84 (04/30 1638) SpO2:  [97 %-98 %] 98 % (04/30 1638)  General - Well nourished, well developed, in no apparent distress.  Ophthalmologic - fundi not visualized due to noncooperation.  Cardiovascular - Regular rhythm and rate.  Neuro - awake, alert, eyes open, mild expressive aphasia, difficulty finding words for orientation questions but able to nod to correct answers but not orientated to time, following all simple commands. Able to name 3/6 and repeat part of the sentences. No gaze palsy, tracking bilaterally, visual field full, PERRL. No significant facial droop. Tongue midline. Bilateral UEs 5/5, no drift. Bilaterally LEs 4/5, no drift. Sensation symmetrical bilaterally, b/l FTN intact but slow, gait not tested.     ASSESSMENT/PLAN Mr. Arbaaz Declercq is a 89 y.o. male with history of DVT on Eliquis , hypertension, hyperlipidemia, PAD, dementia, stroke admitted for aphasia. No TNK given due to outside window.    Stroke: Left MCA moderate infarct as well as small right cerebellar infarct, embolic pattern, likely due to missing doses of eliquis  CT no acute abnormality, old left temporoparietal infarcts CT  head and neck unremarkable CTP 6/21cc MRI acute posterior left MCA distribution infarct involving the left parietal lobe. Small right cerebellar infarct.  2D Echo EF 45-50% LDL 49 HgbA1c 4.8 Eliquis  for VTE prophylaxis Eliquis  (likely missing doses prior to admission, now on Eliquis  5 mg twice daily. Discussed with daughter, pt medication taking will be supervised. Patient counseled to be compliant with his antithrombotic medications Ongoing aggressive stroke risk factor management Therapy recommendations:  CIR Disposition: Pending  History of stroke 05/2018 admitted for aphasia, found to have left parietal occipital infarct on MRI.  CT negative.  MRA and carotid Doppler negative.  EF 20 to 25%.  LDL 86, A1c 5.3.  UDS positive for THC.  Discharged on DAPT and Lipitor  80.  With residual stuttering speech.  Cardiomyopathy EF 20 to 25% in 05/2018 This admission 2D echo EF 45-50% Follow-up outpatient with Dr. Filiberto Hug  Hyperthyroidism TSH < 0.010 Free T4 = 1.3 Management per primary team  Hypertension Stable On home zebeta , Lasix  and Norvasc  Avoid low BP Long term BP goal normotensive  Hyperlipidemia Home meds: Lipitor  80 LDL 49, goal < 70 Now on home Lipitor  80 Continue statin at discharge  Other Stroke Risk Factors Advanced age PAD History of DVT in 08/2023, on Eliquis  at home  Other Active Problems Dementia on Aricept  BPH on Flomax   Hospital day # 1  Neurology will sign off. Please call with questions. Pt will follow up with stroke clinic NP at [facility] in about 4 weeks. Thanks for the consult.   Consuelo Denmark, MD PhD Stroke Neurology 03/24/2024 5:34 PM    To contact Stroke Continuity provider, please refer to [email or web address]. After hours, contact General Neurology

## 2024-03-24 NOTE — Progress Notes (Signed)
 PROGRESS NOTE        PATIENT DETAILS Name: Jesus Lam Age: 89 y.o. Sex: male Date of Birth: Sep 25, 1933 Admit Date: 03/22/2024 Admitting Physician Tylene Galla Donzell Gallery, MD ZOX:[id number], Abelino Able, FNP  Brief Summary: Patient is a 89 y.o.  male with history of HTN, HLD, VTE on Eliquis , CVA 2019-presented with new onset expressive aphasia.  Significant events: 4/28>> admit to [facility]  Significant studies: 4/28>> CT head: No acute intracranial abnormality 4/28>> CTA head/neck: No LVO-completed infarct in the superior left parietal cortex.  No significant stenosis in the carotid system. 4/29>> MRI brain: Left MCA distribution infarct.  Single small focus of petechial hemorrhage without hemorrhagic transformation.  2 cm acute right cerebellar infarct. 4/29>> LDL: 49 4/29>> A1c: 4.8  Significant microbiology data: None  Procedures: None  Consults: Neurology Cards  Subjective: Speaking a bit more fluently today.  No complaints.  Objective: Vitals: Blood pressure (!) 145/69, pulse (!) 59, temperature 98.4 F (36.9 C), temperature source Oral, resp. rate 19, SpO2 97%.   Exam: Gen Exam:Alert awake-not in any distress HEENT:atraumatic, normocephalic Chest: B/L clear to auscultation anteriorly CVS:S1S2 regular Abdomen:soft non tender, non distended Extremities:no edema Neurology: Non focal Skin: no rash  Pertinent Labs/Radiology:    Latest Ref Rng & Units 03/22/2024   10:39 AM 03/22/2024   10:33 AM 08/06/2023   11:30 AM  CBC  WBC 4.0 - 10.5 K/uL  6.3  5.3   Hemoglobin 13.0 - 17.0 g/dL 54.0  98.1  19.1   Hematocrit 39.0 - 52.0 % 37.0  36.8  36.6   Platelets 150 - 400 K/uL  166  186     Lab Results  Component Value Date   NA 145 03/22/2024   K 3.9 03/22/2024   CL 112 (H) 03/22/2024   CO2 22 03/22/2024      Assessment/Plan: Acute CVA Embolic etiology suspected-in the setting of probable missed dose of Eliquis . Expressive aphasia  improved Workup as above Echo pending SNF now being planned on discharge.  HTN BP relatively stable Bisoprolol  held due to bradycardia. Continue amlodipine /Lasix .  HLD Statin  BPH Flomax   History of DVT Eliquis   Sinus bradycardia with sinus pauses Asymptomatic-likely due to bisoprolol /Aricept  use Bisoprolol /Aricept  held Telemetry monitoring Cardiology following.    Chronic HFrEF (EF 25-30% by echo in 2019) Euvolemic Continue Lasix  Holding bisoprolol  due to sinus bradycardia. Awaiting repeat echo.  PAD-s/p left SFA intervention in 2019 Suspect not on antiplatelets as on anticoagulation Continue statin  Hyperthyroidism TSH suppressed-FT4 elevated Beta-blocker held due to bradycardia Continue Tapazole  Repeat TSH/FT4 in 4 to 6 weeks Needs outpatient endocrinology evaluation-as no inpatient endocrinology available.  Dementia/cognitive dysfunction Aricept  on hold-see above Appears to be mild-currently compounded by expressive aphasia Delirium precautions.  Class 1 Obesity: Estimated body mass index is 32.44 kg/m as calculated from the following:   Height as of 11/03/23: 6' (1.829 m).   Weight as of 11/03/23: 108.5 kg.   Code status:   Code Status: Full Code   DVT Prophylaxis: apixaban  (ELIQUIS ) tablet 5 mg     Family Communication: Daughter-[name]-[phone] updated 4/29   Disposition Plan: Status is: Observation The patient will require care spanning > 2 midnights and should be moved to inpatient because: Severity of illness   Planned Discharge Destination: SNF   Diet: Diet Order             Diet  Heart Room service appropriate? Yes; Fluid consistency: Thin  Diet effective now                     Antimicrobial agents: Anti-infectives (From admission, onward)    None        MEDICATIONS: Scheduled Meds:  amLODipine   5 mg Oral Daily   apixaban   5 mg Oral BID   atorvastatin   80 mg Oral Daily   azelastine   2 spray Each Nare BID    furosemide   20 mg Oral Daily   gabapentin   300 mg Oral QHS   methIMAzole   5 mg Oral BID   tamsulosin   0.4 mg Oral Daily   Continuous Infusions: PRN Meds:.acetaminophen , ipratropium-albuterol , ondansetron  (ZOFRAN ) IV   I have personally reviewed following labs and imaging studies  LABORATORY DATA: CBC: Recent Labs  Lab 03/22/24 1033 03/22/24 1039  WBC 6.3  --   NEUTROABS 3.2  --   HGB 11.7* 12.6*  HCT 36.8* 37.0*  MCV 93.6  --   PLT 166  --     Basic Metabolic Panel: Recent Labs  Lab 03/22/24 1033 03/22/24 1039  NA 142 145  K 3.9 3.9  CL 110 112*  CO2 22  --   GLUCOSE 106* 103*  BUN 16 17  CREATININE 1.15 1.20  CALCIUM  9.5  --     GFR: CrCl cannot be calculated (Unknown ideal weight.).  Liver Function Tests: Recent Labs  Lab 03/22/24 1033  AST 21  ALT 15  ALKPHOS 72  BILITOT 0.7  PROT 7.6  ALBUMIN 3.3*   No results for input(s): "LIPASE", "AMYLASE" in the last 168 hours. No results for input(s): "AMMONIA" in the last 168 hours.  Coagulation Profile: No results for input(s): "INR", "PROTIME" in the last 168 hours.  Cardiac Enzymes: No results for input(s): "CKTOTAL", "CKMB", "CKMBINDEX", "TROPONINI" in the last 168 hours.  BNP (last 3 results) Recent Labs    09/17/23 1504  PROBNP 213    Lipid Profile: Recent Labs    03/23/24 0836  CHOL 94  HDL 23*  LDLCALC 49  TRIG 161  CHOLHDL 4.1    Thyroid  Function Tests: Recent Labs    03/23/24 0836  TSH <0.010*  FREET4 1.30*    Anemia Panel: No results for input(s): "VITAMINB12", "FOLATE", "FERRITIN", "TIBC", "IRON", "RETICCTPCT" in the last 72 hours.  Urine analysis:    Component Value Date/Time   COLORURINE YELLOW 08/06/2023 1316   APPEARANCEUR CLOUDY (A) 08/06/2023 1316   LABSPEC 1.014 08/06/2023 1316   PHURINE 6.0 08/06/2023 1316   GLUCOSEU NEGATIVE 08/06/2023 1316   HGBUR NEGATIVE 08/06/2023 1316   BILIRUBINUR Negative 11/03/2023 1642   KETONESUR NEGATIVE 08/06/2023 1316    PROTEINUR Positive (A) 11/03/2023 1642   PROTEINUR 30 (A) 08/06/2023 1316   UROBILINOGEN 0.2 11/03/2023 1642   UROBILINOGEN 0.2 03/26/2022 1619   NITRITE Negative 11/03/2023 1642   NITRITE NEGATIVE 08/06/2023 1316   LEUKOCYTESUR Small (1+) (A) 11/03/2023 1642   LEUKOCYTESUR LARGE (A) 08/06/2023 1316    Sepsis Labs: Lactic Acid, Venous No results found for: "LATICACIDVEN"  MICROBIOLOGY: No results found for this or any previous visit (from the past 240 hours).  RADIOLOGY STUDIES/RESULTS: MR BRAIN WO CONTRAST Result Date: 03/23/2024 CLINICAL DATA:  Follow-up examination for stroke. EXAM: MRI HEAD WITHOUT CONTRAST TECHNIQUE: Multiplanar, multiecho pulse sequences of the brain and surrounding structures were obtained without intravenous contrast. COMPARISON:  Prior CTs from 03/22/2024. FINDINGS: Brain: Mild age-related cerebral atrophy. No significant cerebral white  matter disease for age. Encephalomalacia and gliosis involving the left temporal occipital region, consistent with a chronic ischemic infarct. Just superior to this within the left parietal lobe, there is a confluent area of restricted diffusion involving the cortical subcortical aspect of the left parietal lobe, consistent with an evolving acute posterior left MCA distribution infarct. Area of infarction measures up to 6 cm in greatest diameter. No significant regional mass effect. Single small focus of associated petechial hemorrhage without hemorrhagic transformation (series 7, image 18). Additionally, subtle 2 cm linear focus of diffusion signal abnormality involving the right cerebellum, consistent with a tiny acute ischemic infarct (series 3, image 18). No associated hemorrhage or mass effect. No other evidence for acute or subacute ischemia. Gray-white matter differentiation otherwise maintained. No acute or significant chronic intracranial blood products. No mass lesion, midline shift or mass effect. No hydrocephalus or extra-axial  fluid collection. Pituitary gland within normal limits. Vascular: Major intracranial vascular flow voids are maintained. Skull and upper cervical spine: Craniocervical junction within normal limits. Bone marrow signal intensity normal. No scalp soft tissue abnormality. Sinuses/Orbits: Prior bilateral ocular lens replacement. Right maxillary sinus retention cyst. Paranasal sinuses are otherwise largely clear. No mastoid effusion. Other: None. IMPRESSION: 1. 6 cm evolving acute posterior left MCA distribution infarct involving the left parietal lobe. Single small focus of associated petechial hemorrhage without hemorrhagic transformation. No significant regional mass effect. 2. 2 cm acute ischemic nonhemorrhagic right cerebellar infarct. 3. Chronic left temporoccipital infarct. 4. Underlying mild age-related cerebral atrophy. Electronically Signed   By: Virgia Griffins M.D.   On: 03/23/2024 19:26   CT ANGIO HEAD NECK W WO CM W PERF (CODE STROKE) Result Date: 03/22/2024 CLINICAL DATA:  Stroke workup EXAM: CT ANGIOGRAPHY HEAD AND NECK CT PERFUSION BRAIN TECHNIQUE: Multidetector CT imaging of the head and neck was performed using the standard protocol during bolus administration of intravenous contrast. Multiplanar CT image reconstructions and MIPs were obtained to evaluate the vascular anatomy. Carotid stenosis measurements (when applicable) are obtained utilizing NASCET criteria, using the distal internal carotid diameter as the denominator. Multiphase CT imaging of the brain was performed following IV bolus contrast injection. Subsequent parametric perfusion maps were calculated using RAPID software. RADIATION DOSE REDUCTION: This exam was performed according to the departmental dose-optimization program which includes automated exposure control, adjustment of the mA and/or kV according to patient size and/or use of iterative reconstruction technique. CONTRAST:  OMNIPAQUE  IOHEXOL  350 MG/ML SOLN  COMPARISON:  Noncontrast head CT from earlier today FINDINGS: CTA NECK FINDINGS Aortic arch: Extensive atheromatous plaque, 2 vessel branching Right carotid system: Moderate atheromatous plaque at the bifurcation without flow reducing stenosis, ulceration, or beading Left carotid system: Moderate atheromatous plaque at the bifurcation without stenosis, ulceration, or beading. Vertebral arteries: No proximal subclavian stenosis. Calcified plaque at the right vertebral origin causing mild-to-moderate stenosis. Robust flow in the left vertebral artery which is dominant. Skeleton: No acute finding Other neck: Goiter with asymmetric heterogeneous enlargement of the left lobe pattern unchanged from 2023 CT of the neck. Low-density nodule with calcification in the right lower internal jugular vein attributed to nonocclusive, chronic thrombus. Upper chest: No acute finding Review of the MIP images confirms the above findings CTA HEAD FINDINGS Anterior circulation: No major branch occlusion, beading, or proximal flow reducing stenosis. Mild atheromatous calcification at the cavernous carotids. Posterior circulation: The vertebral arteries show atheromatous calcification. No flow reducing stenosis seen in the vertebral or basilar circulation. No branch occlusion. Venous sinuses: Unremarkable Anatomic variants: None  significant Review of the MIP images confirms the above findings CT Brain Perfusion Findings: CBF (<30%) Volume: 60mL-this has a early CT correlate. Perfusion (Tmax>6.0s) volume: 21mL Mismatch Volume: 15mL Case discussed with Dr. Lindzen at time of dictation. IMPRESSION: No emergent large vessel occlusion. 6 cc acute, completed infarct in the superior left parietal cortex. There is 15 cc of adjacent penumbra intervening this infarct and a pre-existing left parietal infarct which is chronic. The affected peripheral branch vessel is not visualized. Atherosclerosis without flow reducing stenosis or embolic source seen  in the proximal vasculature. Electronically Signed   By: Ronnette Coke M.D.   On: 03/22/2024 11:24   CT HEAD CODE STROKE WO CONTRAST` Result Date: 03/22/2024 CLINICAL DATA:  Code stroke. Neuro deficit, concern for stroke, aphasia. EXAM: CT HEAD WITHOUT CONTRAST TECHNIQUE: Contiguous axial images were obtained from the base of the skull through the vertex without intravenous contrast. RADIATION DOSE REDUCTION: This exam was performed according to the departmental dose-optimization program which includes automated exposure control, adjustment of the mA and/or kV according to patient size and/or use of iterative reconstruction technique. COMPARISON:  CT head 11/30/2020, MRI head 06/13/2018. FINDINGS: Brain: No acute intracranial hemorrhage. No CT evidence of acute infarct. Redemonstrated remote infarct involving the left temporal occipital cortex and subcortical white matter. Nonspecific hypoattenuation in the periventricular and subcortical white matter favored to reflect chronic microvascular ischemic changes. No edema, mass effect, or midline shift. The basilar cisterns are patent. Ventricles: Prominence of the ventricles suggesting underlying parenchymal volume loss. Vascular: Atherosclerotic calcifications of the carotid siphons and intracranial vertebral arteries. No hyperdense vessel. Skull: No acute or aggressive finding. Orbits: Orbits are symmetric. Sinuses: Mucous retention cyst in the right maxillary sinus. Other: Mastoid air cells are clear. 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 ASPECTS [facility] Stroke Program Early CT Score) - Ganglionic level infarction (caudate, lentiform nuclei, internal capsule, insula, M1-M3 cortex): 7 - Supraganglionic infarction (M4-M6 cortex): 3 Total score (0-10 with 10 being normal): 10 IMPRESSION: 1. No CT evidence of acute intracranial abnormality. 2. Redemonstrated remote infarct involving the left temporal occipital lobes. 3. Mild chronic  microvascular ischemic changes and mild parenchymal volume loss. 4. ASPECTS is 10 These results were communicated to Dr. Lindzen At 11:07 am on 03/22/2024 by text page via the [facility] messaging system. Electronically Signed   By: Denny Flack M.D.   On: 03/22/2024 11:07     LOS: 1 day   Kimberly Penna, MD  [facility]    To contact the attending provider between 7A-7P or the covering provider during after hours 7P-7A, please log into the web site [email or web address] and access using universal [place] password for that web site. If you do not have the password, please call the hospital operator.  03/24/2024, 10:49 AM

## 2024-03-25 ENCOUNTER — Other Ambulatory Visit: Payer: Self-pay | Admitting: Cardiology

## 2024-03-25 DIAGNOSIS — R001 Bradycardia, unspecified: Secondary | ICD-10-CM | POA: Diagnosis not present

## 2024-03-25 DIAGNOSIS — I455 Other specified heart block: Secondary | ICD-10-CM

## 2024-03-25 DIAGNOSIS — I10 Essential (primary) hypertension: Secondary | ICD-10-CM | POA: Diagnosis not present

## 2024-03-25 DIAGNOSIS — I619 Nontraumatic intracerebral hemorrhage, unspecified: Secondary | ICD-10-CM | POA: Diagnosis not present

## 2024-03-25 DIAGNOSIS — I5022 Chronic systolic (congestive) heart failure: Secondary | ICD-10-CM

## 2024-03-25 MED ORDER — TAMSULOSIN HCL 0.4 MG PO CAPS
0.8000 mg | ORAL_CAPSULE | Freq: Every day | ORAL | Status: DC
  Administered 2024-03-26: 0.8 mg via ORAL
  Filled 2024-03-25: qty 2

## 2024-03-25 MED ORDER — METHIMAZOLE 5 MG PO TABS: 5.0000 mg | ORAL_TABLET | Freq: Two times a day (BID) | ORAL | Status: DC

## 2024-03-25 NOTE — Plan of Care (Signed)
   Problem: Education: Goal: Knowledge of General Education information will improve Description: Including pain rating scale, medication(s)/side effects and non-pharmacologic comfort measures Outcome: Not Progressing   Problem: Health Behavior/Discharge Planning: Goal: Ability to manage health-related needs will improve Outcome: Not Progressing

## 2024-03-25 NOTE — Progress Notes (Signed)
 Cardiac monitor order for stroke and sinus pauses

## 2024-03-25 NOTE — Progress Notes (Signed)
 Physical Therapy Treatment Patient Details Name: Jesus Lam MRN: [id number] DOB: 1933/10/12 Today's Date: 03/25/2024   History of Present Illness Patient is a 89 y.o.  male admitted 4/28 presented with new onset expressive aphasia.   Left superior parietal infarct.  PMH: HTN, HLD, VTE on Eliquis , CVA 2019    PT Comments  Pt is progressing towards goals. Improved gait this session from last session per last physical therapy note. Pt requires less cues and has improved grip strength on the R. Pt was CGA for bed mobility, Min A for sit to stand and gait with RW. Pt requires safety cues and continues with impulsive movements. Due to pt current functional status, home set up and available assistance at home recommending skilled physical therapy services > 3 hours/day in order to address strength, balance and functional mobility to decrease risk for falls, injury, immobility, skin break down and re-hospitalization.      If plan is discharge home, recommend the following: Assistance with cooking/housework;Assist for transportation;Help with stairs or ramp for entrance;Supervision due to cognitive status   Can travel by private vehicle     No  Equipment Recommendations  BSC/3in1;Rolling walker (2 wheels)    Recommendations for Other Services Rehab consult     Precautions / Restrictions Precautions Precautions: Fall Restrictions Weight Bearing Restrictions Per Provider Order: No     Mobility  Bed Mobility Overal bed mobility: Needs Assistance Bed Mobility: Supine to Sit, Sit to Supine     Supine to sit: Contact guard Sit to supine: Contact guard assist   General bed mobility comments: Cues and guidance but no physical assist.    Transfers Overall transfer level: Needs assistance Equipment used: Rolling walker (2 wheels) Transfers: Sit to/from Stand Sit to Stand: Min assist           General transfer comment: Pt was able to stand to RW wtih min assist and cues from bed in  lowest position    Ambulation/Gait Ambulation/Gait assistance: Min assist Gait Distance (Feet): 120 Feet Assistive device: Rolling walker (2 wheels) Gait Pattern/deviations: Step-through pattern, Decreased step length - right, Decreased stride length, Ataxic, Drifts right/left, Trunk flexed, Wide base of support Gait velocity: decreased Gait velocity interpretation: <1.31 ft/sec, indicative of household ambulator   General Gait Details: Pt with uncoordinated gait overall with assist and cues for safety. Pt needed assist and cues to move RW, to stay close to RW and for sequencing steps and RW. Pt with very poor safety awareness needing constant cues and assist. Pt with poor coordination of right LE . Pt did well keeping the R hand on walker during session    Modified Rankin (Stroke Patients Only) Modified Rankin (Stroke Patients Only) Pre-Morbid Rankin Score: No symptoms Modified Rankin: Moderately severe disability     Balance Overall balance assessment: Needs assistance Sitting-balance support: No upper extremity supported, Feet supported Sitting balance-Leahy Scale: Good Sitting balance - Comments: unsupported EOB   Standing balance support: Bilateral upper extremity supported, During functional activity Standing balance-Leahy Scale: Poor Standing balance comment: relies on UE support and external support      Communication Communication Communication: Impaired Factors Affecting Communication: Difficulty expressing self;Reduced clarity of speech;Other (comment)  Cognition Arousal: Alert Behavior During Therapy: Impulsive   PT - Cognitive impairments: Orientation, Awareness, Attention, Initiation, Sequencing, Problem solving, Safety/Judgement       Following commands: Impaired Following commands impaired: Follows one step commands with increased time    Cueing Cueing Techniques: Verbal cues, Tactile cues  General Comments General comments (skin integrity, edema,  etc.): daughter present during session.      Pertinent Vitals/Pain Pain Assessment Pain Assessment: No/denies pain     PT Goals (current goals can now be found in the care plan section) Acute Rehab PT Goals Patient Stated Goal: to go home PT Goal Formulation: With patient Time For Goal Achievement: 04/06/24 Potential to Achieve Goals: Good Progress towards PT goals: Progressing toward goals    Frequency    Min 3X/week      PT Plan  Continue with current POC        AM-PAC PT "6 Clicks" Mobility   Outcome Measure  Help needed turning from your back to your side while in a flat bed without using bedrails?: A Little Help needed moving from lying on your back to sitting on the side of a flat bed without using bedrails?: A Little Help needed moving to and from a bed to a chair (including a wheelchair)?: A Little Help needed standing up from a chair using your arms (e.g., wheelchair or bedside chair)?: A Little Help needed to walk in hospital room?: A Lot Help needed climbing 3-5 steps with a railing? : A Lot 6 Click Score: 16    End of Session Equipment Utilized During Treatment: Gait belt Activity Tolerance: Patient limited by fatigue;Patient tolerated treatment well Patient left: with call bell/phone within reach;with family/visitor present;in bed;with bed alarm set Nurse Communication: Mobility status PT Visit Diagnosis: Unsteadiness on feet (R26.81);Other abnormalities of gait and mobility (R26.89);Muscle weakness (generalized) (M62.81);Hemiplegia and hemiparesis Hemiplegia - Right/Left: Right Hemiplegia - dominant/non-dominant: Dominant Hemiplegia - caused by: Cerebral infarction     Time: 1515-1530 PT Time Calculation (min) (ACUTE ONLY): 15 min  Charges:    $Gait Training: 8-22 mins PT General Charges $$ ACUTE PT VISIT: 1 Visit                    Jesus Lam, DPT, CLT  Acute Rehabilitation Services Office: [phone] (Secure chat  preferred)    Jesus Lam 03/25/2024, 4:20 PM

## 2024-03-25 NOTE — TOC Progression Note (Addendum)
 Transition of Care [facility]) - Progression Note    Patient Details  Name: Jesus Lam MRN: [id number] Date of Birth: 11-16-1933  [facility] CM/SW Contact  Jannice Mends, LCSW Phone Number: 03/25/2024, 8:54 AM  Clinical Narrative:    8:54 AM-Insurance approval still pending for [facility].  11am: Peer to Peer requested by insurance: p. [phone] option 5, deadline 3pm. MD will complete.  CSW updated patient's daughter.     Expected Discharge Plan: IP Rehab Facility Barriers to Discharge: Continued Medical Work up  Expected Discharge Plan and Services       Living arrangements for the past 2 months: Single Family Home                                       Social Determinants of Health (SDOH) Interventions SDOH Screenings   Food Insecurity: No Food Insecurity (03/22/2024)  Housing: Low Risk  (03/22/2024)  Transportation Needs: No Transportation Needs (03/22/2024)  Utilities: Not At Risk (03/22/2024)  Alcohol Screen: Low Risk  (06/04/2023)  Depression (PHQ2-9): Low Risk  (08/21/2023)  Financial Resource Strain: Low Risk  (06/04/2023)  Physical Activity: Sufficiently Active (06/04/2023)  Recent Concern: Physical Activity - Insufficiently Active (06/04/2023)  Social Connections: Moderately Integrated (03/22/2024)  Stress: No Stress Concern Present (06/04/2023)  Tobacco Use: Medium Risk (03/22/2024)    Readmission Risk Interventions     No data to display

## 2024-03-25 NOTE — Progress Notes (Addendum)
 PROGRESS NOTE        PATIENT DETAILS Name: Jesus Lam Age: 89 y.o. Sex: male Date of Birth: Dec 14, 1932 Admit Date: 03/22/2024 Admitting Physician Tylene Galla Donzell Gallery, MD JYN:WGNFA, Abelino Able, FNP  Brief Summary: Patient is a 89 y.o.  male with history of HTN, HLD, VTE on Eliquis , CVA 2019-presented with new onset expressive aphasia.  Significant events: 4/28>> admit to [facility]  Significant studies: 4/28>> CT head: No acute intracranial abnormality 4/28>> CTA head/neck: No LVO-completed infarct in the superior left parietal cortex.  No significant stenosis in the carotid system. 4/29>> MRI brain: Left MCA distribution infarct.  Single small focus of petechial hemorrhage without hemorrhagic transformation.  2 cm acute right cerebellar infarct. 4/29>> LDL: 49 4/29>> A1c: 4.8 4/30>>Echo:EF 45-50%  Significant microbiology data: None  Procedures: None  Consults: Neurology Cards  Subjective: No complaints-speech slightly better.  Objective: Vitals: Blood pressure (!) 146/89, pulse 61, temperature 98.7 F (37.1 C), temperature source Oral, resp. rate 17, SpO2 95%.   Exam: Awake/alert Some ongoing expressive aphasia but seems to be better than yesterday Nonfocal exam  Pertinent Labs/Radiology:    Latest Ref Rng & Units 03/22/2024   10:39 AM 03/22/2024   10:33 AM 08/06/2023   11:30 AM  CBC  WBC 4.0 - 10.5 K/uL  6.3  5.3   Hemoglobin 13.0 - 17.0 g/dL 21.3  08.6  57.8   Hematocrit 39.0 - 52.0 % 37.0  36.8  36.6   Platelets 150 - 400 K/uL  166  186     Lab Results  Component Value Date   NA 145 03/22/2024   K 3.9 03/22/2024   CL 112 (H) 03/22/2024   CO2 22 03/22/2024      Assessment/Plan: Acute CVA Embolic etiology suspected-in the setting of probable missed dose of Eliquis . Expressive aphasia improved Continue Eliquis  Workup as above Awaiting insurance authorization for SNF.  HTN BP relatively stable Bisoprolol  held due to  bradycardia. Continue amlodipine /Lasix .  HLD Statin  BPH Flomax   History of DVT Eliquis   Sinus bradycardia with sinus pauses Asymptomatic-likely due to bisoprolol /Aricept  use Bisoprolol /Aricept  held Telemetry monitoring Cardiology following-recommendation are to continue to monitor.  Home  Chronic HFrEF  Euvolemic Continue Lasix  Holding bisoprolol  due to sinus bradycardia.   Repeat echo on 4/30 with significant improvement in EF.  PAD-s/p left SFA intervention in 2019 Suspect not on antiplatelets as on anticoagulation Continue statin  Hyperthyroidism TSH suppressed-FT4 elevated Beta-blocker held due to bradycardia Continue Tapazole  Repeat TSH/FT4 in 4 to 6 weeks Needs outpatient endocrinology evaluation-as no inpatient endocrinology available.  Dementia/cognitive dysfunction Aricept  on hold-see above Appears to be mild-currently compounded by expressive aphasia Delirium precautions.  Class 1 Obesity: Estimated body mass index is 32.44 kg/m as calculated from the following:   Height as of 11/03/23: 6' (1.829 m).   Weight as of 11/03/23: 108.5 kg.   Code status:   Code Status: Full Code   DVT Prophylaxis: apixaban  (ELIQUIS ) tablet 5 mg     Family Communication: Daughter-[name] updated for 5/01   Disposition Plan: Status is: Observation The patient will require care spanning > 2 midnights and should be moved to inpatient because: Severity of illness   Planned Discharge Destination: SNF-medically stable awaiting insurance authorization.   Diet: Diet Order             Diet Heart Room service appropriate? Yes; Fluid consistency:  Thin  Diet effective now                     Antimicrobial agents: Anti-infectives (From admission, onward)    None        MEDICATIONS: Scheduled Meds:  amLODipine   5 mg Oral Daily   apixaban   5 mg Oral BID   atorvastatin   80 mg Oral Daily   azelastine   2 spray Each Nare BID   furosemide   20 mg  Oral Daily   gabapentin   300 mg Oral QHS   methIMAzole   5 mg Oral BID   [START ON 03/26/2024] tamsulosin   0.8 mg Oral Daily   Continuous Infusions: PRN Meds:.acetaminophen , ipratropium-albuterol , ondansetron  (ZOFRAN ) IV   I have personally reviewed following labs and imaging studies  LABORATORY DATA: CBC: Recent Labs  Lab 03/22/24 1033 03/22/24 1039  WBC 6.3  --   NEUTROABS 3.2  --   HGB 11.7* 12.6*  HCT 36.8* 37.0*  MCV 93.6  --   PLT 166  --     Basic Metabolic Panel: Recent Labs  Lab 03/22/24 1033 03/22/24 1039  NA 142 145  K 3.9 3.9  CL 110 112*  CO2 22  --   GLUCOSE 106* 103*  BUN 16 17  CREATININE 1.15 1.20  CALCIUM  9.5  --     GFR: CrCl cannot be calculated (Unknown ideal weight.).  Liver Function Tests: Recent Labs  Lab 03/22/24 1033  AST 21  ALT 15  ALKPHOS 72  BILITOT 0.7  PROT 7.6  ALBUMIN 3.3*   No results for input(s): "LIPASE", "AMYLASE" in the last 168 hours. No results for input(s): "AMMONIA" in the last 168 hours.  Coagulation Profile: No results for input(s): "INR", "PROTIME" in the last 168 hours.  Cardiac Enzymes: No results for input(s): "CKTOTAL", "CKMB", "CKMBINDEX", "TROPONINI" in the last 168 hours.  BNP (last 3 results) Recent Labs    09/17/23 1504  PROBNP 213    Lipid Profile: Recent Labs    03/23/24 0836  CHOL 94  HDL 23*  LDLCALC 49  TRIG 161  CHOLHDL 4.1    Thyroid  Function Tests: Recent Labs    03/23/24 0836  TSH <0.010*  FREET4 1.30*    Anemia Panel: No results for input(s): "VITAMINB12", "FOLATE", "FERRITIN", "TIBC", "IRON", "RETICCTPCT" in the last 72 hours.  Urine analysis:    Component Value Date/Time   COLORURINE YELLOW 08/06/2023 1316   APPEARANCEUR CLOUDY (A) 08/06/2023 1316   LABSPEC 1.014 08/06/2023 1316   PHURINE 6.0 08/06/2023 1316   GLUCOSEU NEGATIVE 08/06/2023 1316   HGBUR NEGATIVE 08/06/2023 1316   BILIRUBINUR Negative 11/03/2023 1642   KETONESUR NEGATIVE 08/06/2023 1316    PROTEINUR Positive (A) 11/03/2023 1642   PROTEINUR 30 (A) 08/06/2023 1316   UROBILINOGEN 0.2 11/03/2023 1642   UROBILINOGEN 0.2 03/26/2022 1619   NITRITE Negative 11/03/2023 1642   NITRITE NEGATIVE 08/06/2023 1316   LEUKOCYTESUR Small (1+) (A) 11/03/2023 1642   LEUKOCYTESUR LARGE (A) 08/06/2023 1316    Sepsis Labs: Lactic Acid, Venous No results found for: "LATICACIDVEN"  MICROBIOLOGY: No results found for this or any previous visit (from the past 240 hours).  RADIOLOGY STUDIES/RESULTS: ECHOCARDIOGRAM COMPLETE Result Date: 03/24/2024    ECHOCARDIOGRAM REPORT   Patient Name:   PATRYK WILKS Date of Exam: 03/24/2024 Medical Rec #:  [id number]       Height:       72.0 in Accession #:    [id number]      Weight:  239.2 lb Date of Birth:  07-17-1933        BSA:          2.299 m Patient Age:    89 years        BP:           159/84 mmHg Patient Gender: M               HR:           51 bpm. Exam Location:  Inpatient Procedure: 2D Echo, Color Doppler and Cardiac Doppler (Both Spectral and Color            Flow Doppler were utilized during procedure). Indications:    Stroke  History:        Patient has prior history of Echocardiogram examinations, most                 recent 06/13/2018.  Sonographer:    Andrena Bang Referring Phys: Mayo Speck Estil Heman Charlize Hathaway IMPRESSIONS  1. Left ventricular ejection fraction, by estimation, is 45 to 50%. The left ventricle has mildly decreased function. The left ventricle demonstrates global hypokinesis. There is moderate concentric left ventricular hypertrophy. Left ventricular diastolic parameters are consistent with Grade I diastolic dysfunction (impaired relaxation).  2. Right ventricular systolic function is normal. The right ventricular size is normal. There is normal pulmonary artery systolic pressure.  3. Left atrial size was moderately dilated.  4. The mitral valve is normal in structure. Trivial mitral valve regurgitation. No evidence of mitral stenosis.  5. The  aortic valve is calcified. There is mild calcification of the aortic valve. There is mild thickening of the aortic valve. Aortic valve regurgitation is mild to moderate. Mild aortic valve stenosis. Aortic valve area, by VTI measures 1.41 cm. Aortic valve mean gradient measures 12.0 mmHg. Aortic valve Vmax measures 2.51 m/s.  6. Aortic dilatation noted. There is mild dilatation of the ascending aorta, measuring 41 mm.  7. The inferior vena cava is normal in size with greater than 50% respiratory variability, suggesting right atrial pressure of 3 mmHg. FINDINGS  Left Ventricle: Left ventricular ejection fraction, by estimation, is 45 to 50%. The left ventricle has mildly decreased function. The left ventricle demonstrates global hypokinesis. The left ventricular internal cavity size was normal in size. There is  moderate concentric left ventricular hypertrophy. Left ventricular diastolic parameters are consistent with Grade I diastolic dysfunction (impaired relaxation). Indeterminate filling pressures. Right Ventricle: The right ventricular size is normal. No increase in right ventricular wall thickness. Right ventricular systolic function is normal. There is normal pulmonary artery systolic pressure. The tricuspid regurgitant velocity is 1.78 m/s, and  with an assumed right atrial pressure of 3 mmHg, the estimated right ventricular systolic pressure is 15.7 mmHg. Left Atrium: Left atrial size was moderately dilated. Right Atrium: Right atrial size was normal in size. Pericardium: There is no evidence of pericardial effusion. Mitral Valve: The mitral valve is normal in structure. Trivial mitral valve regurgitation. No evidence of mitral valve stenosis. Tricuspid Valve: The tricuspid valve is normal in structure. Tricuspid valve regurgitation is trivial. No evidence of tricuspid stenosis. Aortic Valve: The aortic valve is calcified. There is mild calcification of the aortic valve. There is mild thickening of the  aortic valve. Aortic valve regurgitation is mild to moderate. Mild aortic stenosis is present. Aortic valve mean gradient measures 12.0 mmHg. Aortic valve peak gradient measures 25.2 mmHg. Aortic valve area, by VTI measures 1.41 cm. Pulmonic Valve: The pulmonic valve was  normal in structure. Pulmonic valve regurgitation is not visualized. No evidence of pulmonic stenosis. Aorta: Aortic dilatation noted. There is mild dilatation of the ascending aorta, measuring 41 mm. Venous: The inferior vena cava is normal in size with greater than 50% respiratory variability, suggesting right atrial pressure of 3 mmHg. IAS/Shunts: No atrial level shunt detected by color flow Doppler.  LEFT VENTRICLE PLAX 2D LVIDd:         4.40 cm      Diastology LVIDs:         3.60 cm      LV e' medial:    4.13 cm/s LV PW:         1.40 cm      LV E/e' medial:  14.3 LV IVS:        1.60 cm      LV e' lateral:   5.66 cm/s LVOT diam:     2.40 cm      LV E/e' lateral: 10.5 LV SV:         71 LV SV Index:   31 LVOT Area:     4.52 cm  LV Volumes (MOD) LV vol d, MOD A2C: 221.0 ml LV vol d, MOD A4C: 230.0 ml LV vol s, MOD A2C: 102.0 ml LV vol s, MOD A4C: 119.0 ml LV SV MOD A2C:     119.0 ml LV SV MOD A4C:     230.0 ml LV SV MOD BP:      116.7 ml RIGHT VENTRICLE RV S prime:     8.81 cm/s TAPSE (M-mode): 1.4 cm LEFT ATRIUM              Index LA diam:        4.10 cm  1.78 cm/m LA Vol (A2C):   110.0 ml 47.85 ml/m LA Vol (A4C):   55.2 ml  24.01 ml/m LA Biplane Vol: 78.2 ml  34.02 ml/m  AORTIC VALVE AV Area (Vmax):    1.48 cm AV Area (Vmean):   1.40 cm AV Area (VTI):     1.41 cm AV Vmax:           251.00 cm/s AV Vmean:          158.000 cm/s AV VTI:            0.508 m AV Peak Grad:      25.2 mmHg AV Mean Grad:      12.0 mmHg LVOT Vmax:         82.00 cm/s LVOT Vmean:        49.000 cm/s LVOT VTI:          0.158 m LVOT/AV VTI ratio: 0.31  AORTA Ao Asc diam: 4.10 cm MITRAL VALVE               TRICUSPID VALVE MV Area (PHT): 2.17 cm    TR Peak grad:   12.7  mmHg MV Decel Time: 349 msec    TR Vmax:        178.00 cm/s MV E velocity: 59.20 cm/s MV A velocity: 91.70 cm/s  SHUNTS MV E/A ratio:  0.65        Systemic VTI:  0.16 m                            Systemic Diam: 2.40 cm Maudine Sos MD Electronically signed by Maudine Sos MD Signature Date/Time: 03/24/2024/5:27:41 PM    Final    MR BRAIN  WO CONTRAST Result Date: 03/23/2024 CLINICAL DATA:  Follow-up examination for stroke. EXAM: MRI HEAD WITHOUT CONTRAST TECHNIQUE: Multiplanar, multiecho pulse sequences of the brain and surrounding structures were obtained without intravenous contrast. COMPARISON:  Prior CTs from 03/22/2024. FINDINGS: Brain: Mild age-related cerebral atrophy. No significant cerebral white matter disease for age. Encephalomalacia and gliosis involving the left temporal occipital region, consistent with a chronic ischemic infarct. Just superior to this within the left parietal lobe, there is a confluent area of restricted diffusion involving the cortical subcortical aspect of the left parietal lobe, consistent with an evolving acute posterior left MCA distribution infarct. Area of infarction measures up to 6 cm in greatest diameter. No significant regional mass effect. Single small focus of associated petechial hemorrhage without hemorrhagic transformation (series 7, image 18). Additionally, subtle 2 cm linear focus of diffusion signal abnormality involving the right cerebellum, consistent with a tiny acute ischemic infarct (series 3, image 18). No associated hemorrhage or mass effect. No other evidence for acute or subacute ischemia. Gray-white matter differentiation otherwise maintained. No acute or significant chronic intracranial blood products. No mass lesion, midline shift or mass effect. No hydrocephalus or extra-axial fluid collection. Pituitary gland within normal limits. Vascular: Major intracranial vascular flow voids are maintained. Skull and upper cervical spine: Craniocervical  junction within normal limits. Bone marrow signal intensity normal. No scalp soft tissue abnormality. Sinuses/Orbits: Prior bilateral ocular lens replacement. Right maxillary sinus retention cyst. Paranasal sinuses are otherwise largely clear. No mastoid effusion. Other: None. IMPRESSION: 1. 6 cm evolving acute posterior left MCA distribution infarct involving the left parietal lobe. Single small focus of associated petechial hemorrhage without hemorrhagic transformation. No significant regional mass effect. 2. 2 cm acute ischemic nonhemorrhagic right cerebellar infarct. 3. Chronic left temporoccipital infarct. 4. Underlying mild age-related cerebral atrophy. Electronically Signed   By: Virgia Griffins M.D.   On: 03/23/2024 19:26     LOS: 2 days   Kimberly Penna, MD  [facility]    To contact the attending provider between 7A-7P or the covering provider during after hours 7P-7A, please log into the web site [email or web address] and access using universal [place] password for that web site. If you do not have the password, please call the hospital operator.  03/25/2024, 10:47 AM

## 2024-03-25 NOTE — Discharge Summary (Addendum)
 PATIENT DETAILS Name: Jesus Lam Age: 89 y.o. Sex: male Date of Birth: 03-Nov-1933 MRN: [id number]. Admitting Physician: Burton Casey, MD WUJ:[id number], Abelino Able, FNP  Admit Date: 03/22/2024 Discharge date: 03/26/2024  Recommendations for Outpatient Follow-up:  Follow up with PCP in 1-2 weeks Please obtain CMP/CBC in one week Please ensure follow-up with neurology/cardiology/endocrinology. Repeat TSH/FT4 in 4 to 6 weeks  Admitted From:  Home  Disposition: Skilled nursing facility   Discharge Condition: good  CODE STATUS:   Code Status: Full Code   Diet recommendation:  Diet Order             Diet - low sodium heart healthy           Diet Heart Room service appropriate? Yes; Fluid consistency: Thin  Diet effective now                    Brief Summary: Patient is a 89 y.o.  male with history of HTN, HLD, VTE on Eliquis , CVA 2019-presented with new onset expressive aphasia.   Significant events: 4/28>> admit to [facility]   Significant studies: 4/28>> CT head: No acute intracranial abnormality 4/28>> CTA head/neck: No LVO-completed infarct in the superior left parietal cortex.  No significant stenosis in the carotid system. 4/29>> MRI brain: Left MCA distribution infarct.  Single small focus of petechial hemorrhage without hemorrhagic transformation.  2 cm acute right cerebellar infarct. 4/29>> LDL: 49 4/29>> A1c: 4.8 4/30>>Echo:EF 45-50%   Significant microbiology data: None   Procedures: None   Consults: Neurology Cards  Brief Hospital Course: Acute CVA Embolic etiology suspected-in the setting of probable missed dose of Eliquis . Expressive aphasia improved Continue Eliquis  Workup as above Stable for discharge to SNF Follow up with outpatient stroke neurology.   HTN BP relatively stable Bisoprolol  held due to bradycardia. Continue amlodipine /Lasix /hydralazine    HLD Statin   BPH Flomax    History of DVT Eliquis    Sinus bradycardia  with sinus pauses Asymptomatic-likely due to bisoprolol /Aricept  use Bisoprolol /Aricept  held Heart rate now stable in the 50s-60s. Cardiology consulted during this hospitalization-plan is for outpatient cardiac monitor placement  Chronic HFrEF  Euvolemic Continue Lasix  Holding bisoprolol  due to sinus bradycardia.   Repeat echo on 4/30 with significant improvement in EF.   PAD-s/p left SFA intervention in 2019 Suspect not on antiplatelets as on anticoagulation Continue statin   Hyperthyroidism TSH suppressed-FT4 elevated Beta-blocker held due to bradycardia Continue Tapazole  Repeat TSH/FT4 in 4 to 6 weeks Needs outpatient endocrinology evaluation-as no inpatient endocrinology available.   Dementia/cognitive dysfunction Aricept  on hold-see above Appears to be mild-currently compounded by expressive aphasia Delirium precautions.   Class 1 Obesity: Estimated body mass index is 32.44 kg/m as calculated from the following:   Height as of 11/03/23: 6' (1.829 m).   Weight as of 11/03/23: 108.5 kg.   Discharge Diagnoses:  Principal Problem:   CVA (cerebrovascular accident due to intracerebral hemorrhage) (HCC) Active Problems:   Acute CVA (cerebrovascular accident) (HCC)   Chronic HFrEF (heart failure with reduced ejection fraction) (HCC)   Discharge Instructions:  Activity:  As tolerated with Full fall precautions use walker/cane & assistance as needed  Discharge Instructions     Ambulatory referral to Endocrinology   Complete by: As directed    Ambulatory referral to Neurology   Complete by: As directed    Follow up with stroke clinic NP at [facility] in about 4-6 weeks. Thanks.   Call MD for:  extreme fatigue   Complete by: As directed  Call MD for:  persistant dizziness or light-headedness   Complete by: As directed    Diet - low sodium heart healthy   Complete by: As directed    Discharge instructions   Complete by: As directed    Follow with Primary MD  Susanna Epley, FNP in 1-2 weeks  Follow-up with neurology/stroke clinic-their office will call you with a follow-up appointment-if you do not hear from them-give them a call  Follow-up with endocrinology-for hyperthyroidism-the office will give you a call with a follow-up appointment-if you do not hear from them-please give them a call.  Please get a complete blood count and chemistry panel checked by your Primary MD at your next visit, and again as instructed by your Primary MD.  Get Medicines reviewed and adjusted: Please take all your medications with you for your next visit with your Primary MD  Laboratory/radiological data: Please request your Primary MD to go over all hospital tests and procedure/radiological results at the follow up, please ask your Primary MD to get all Hospital records sent to his/her office.  In some cases, they will be blood work, cultures and biopsy results pending at the time of your discharge. Please request that your primary care M.D. follows up on these results.  Also Note the following: If you experience worsening of your admission symptoms, develop shortness of breath, life threatening emergency, suicidal or homicidal thoughts you must seek medical attention immediately by calling 911 or calling your MD immediately  if symptoms less severe.  You must read complete instructions/literature along with all the possible adverse reactions/side effects for all the Medicines you take and that have been prescribed to you. Take any new Medicines after you have completely understood and accpet all the possible adverse reactions/side effects.   Do not drive when taking Pain medications or sleeping medications (Benzodaizepines)  Do not take more than prescribed Pain, Sleep and Anxiety Medications. It is not advisable to combine anxiety,sleep and pain medications without talking with your primary care practitioner  Special Instructions: If you have smoked or chewed Tobacco  in  the last 2 yrs please stop smoking, stop any regular Alcohol  and or any Recreational drug use.  Wear Seat belts while driving.  Please note: You were cared for by a hospitalist during your hospital stay. Once you are discharged, your primary care physician will handle any further medical issues. Please note that NO REFILLS for any discharge medications will be authorized once you are discharged, as it is imperative that you return to your primary care physician (or establish a relationship with a primary care physician if you do not have one) for your post hospital discharge needs so that they can reassess your need for medications and monitor your lab values.   Increase activity slowly   Complete by: As directed    No wound care   Complete by: As directed       Allergies as of 03/26/2024       Reactions   Pork-derived Products Other (See Comments)   Religious    Shellfish Allergy Other (See Comments)   Gout         Medication List     STOP taking these medications    bisoprolol  5 MG tablet Commonly known as: ZEBETA    donepezil  10 MG tablet Commonly known as: ARICEPT    levocetirizine 5 MG tablet Commonly known as: XYZAL        TAKE these medications    amLODipine  5 MG tablet  Commonly known as: NORVASC  Take 1 tablet (5 mg total) by mouth daily.   apixaban  5 MG Tabs tablet Commonly known as: ELIQUIS  Take 1 tablet (5 mg total) by mouth 2 (two) times daily. Start taking after completion of starter pack. What changed: additional instructions   ascorbic acid  500 MG tablet Commonly known as: VITAMIN C  Take 500 mg daily by mouth.   atorvastatin  40 MG tablet Commonly known as: LIPITOR  Take 2 tablets (80 mg total) by mouth daily.   azelastine  0.1 % nasal spray Commonly known as: ASTELIN  Place 2 sprays into both nostrils 2 (two) times daily. Use in each nostril as directed   COMPLETE SENIOR PO 1 tablet daily.   cyanocobalamin  500 MCG tablet Commonly known as:  VITAMIN B12 Take 500 mcg by mouth daily.   diclofenac  Sodium 1 % Gel Commonly known as: VOLTAREN  APPLY TWO GRAMS TOPICALLY FOUR TIMES A DAY What changed: See the new instructions.   furosemide  20 MG tablet Commonly known as: LASIX  Take 20 mg by mouth daily.   GNP 8 Hour Arthritis Relief 650 MG CR tablet Generic drug: acetaminophen  TAKE ONE TABLET BY MOUTH TWICE A DAY What changed:  when to take this reasons to take this   hydrALAZINE  50 MG tablet Commonly known as: APRESOLINE  Take 1 tablet (50 mg total) by mouth 3 (three) times daily.   methimazole  5 MG tablet Commonly known as: TAPAZOLE  Take 1 tablet (5 mg total) by mouth 2 (two) times daily.   tamsulosin  0.4 MG Caps capsule Commonly known as: FLOMAX  TAKE ONE CAPSULE BY MOUTH DAILY HALF HOUR FOLLOWING THE SAME MEAL DAILY What changed: See the new instructions.   Vitamin D3 125 MCG (5000 UT) Caps Take 5,000 Units by mouth daily.        Contact information for follow-up providers     [facility]. Schedule an appointment as soon as possible for a visit in 1 month(s).   Specialty: Neurology Why: stroke clinic, Office will call with date/time, If you dont hear from them,please give them a call Contact information: [address] [place]  [address] [phone]        [facility] Endocrinology Follow up.   Specialty: Endocrinology Why: Office will call with date/time, If you dont hear from them,please give them a call Contact information: [address], [address] [place]  [phone] [phone]        Susanna Epley, FNP. Schedule an appointment as soon as possible for a visit in 1 week(s).   Specialty: General Practice Contact information: [address] [place] [address] [phone]              Contact information for after-discharge care     Destination     [facility] Preferred SNF .   Service: Skilled Nursing Contact information: [address] [place]  [address] [phone]                    Allergies  Allergen Reactions   Pork-Derived Products Other (See Comments)    Religious    Shellfish Allergy Other (See Comments)    Gout      Other Procedures/Studies: ECHOCARDIOGRAM COMPLETE Result Date: 03/24/2024    ECHOCARDIOGRAM REPORT   Patient Name:   Jesus Lam Date of Exam: 03/24/2024 Medical Rec #:  [id number]       Height:       72.0 in  Accession #:    [id number]      Weight:       239.2 lb Date of Birth:  1933-07-04        BSA:          2.299 m Patient Age:    89 years        BP:           159/84 mmHg Patient Gender: M               HR:           51 bpm. Exam Location:  Inpatient Procedure: 2D Echo, Color Doppler and Cardiac Doppler (Both Spectral and Color            Flow Doppler were utilized during procedure). Indications:    Stroke  History:        Patient has prior history of Echocardiogram examinations, most                 recent 06/13/2018.  Sonographer:    Andrena Bang Referring Phys: Mayo Speck Estil Heman Carnita Golob IMPRESSIONS  1. Left ventricular ejection fraction, by estimation, is 45 to 50%. The left ventricle has mildly decreased function. The left ventricle demonstrates global hypokinesis. There is moderate concentric left ventricular hypertrophy. Left ventricular diastolic parameters are consistent with Grade I diastolic dysfunction (impaired relaxation).  2. Right ventricular systolic function is normal. The right ventricular size is normal. There is normal pulmonary artery systolic pressure.  3. Left atrial size was moderately dilated.  4. The mitral valve is normal in structure. Trivial mitral valve regurgitation. No evidence of mitral stenosis.  5. The aortic valve is calcified. There is mild calcification of the aortic valve. There is mild thickening of the aortic valve. Aortic valve  regurgitation is mild to moderate. Mild aortic valve stenosis. Aortic valve area, by VTI measures 1.41 cm. Aortic valve mean gradient measures 12.0 mmHg. Aortic valve Vmax measures 2.51 m/s.  6. Aortic dilatation noted. There is mild dilatation of the ascending aorta, measuring 41 mm.  7. The inferior vena cava is normal in size with greater than 50% respiratory variability, suggesting right atrial pressure of 3 mmHg. FINDINGS  Left Ventricle: Left ventricular ejection fraction, by estimation, is 45 to 50%. The left ventricle has mildly decreased function. The left ventricle demonstrates global hypokinesis. The left ventricular internal cavity size was normal in size. There is  moderate concentric left ventricular hypertrophy. Left ventricular diastolic parameters are consistent with Grade I diastolic dysfunction (impaired relaxation). Indeterminate filling pressures. Right Ventricle: The right ventricular size is normal. No increase in right ventricular wall thickness. Right ventricular systolic function is normal. There is normal pulmonary artery systolic pressure. The tricuspid regurgitant velocity is 1.78 m/s, and  with an assumed right atrial pressure of 3 mmHg, the estimated right ventricular systolic pressure is 15.7 mmHg. Left Atrium: Left atrial size was moderately dilated. Right Atrium: Right atrial size was normal in size. Pericardium: There is no evidence of pericardial effusion. Mitral Valve: The mitral valve is normal in structure. Trivial mitral valve regurgitation. No evidence of mitral valve stenosis. Tricuspid Valve: The tricuspid valve is normal in structure. Tricuspid valve regurgitation is trivial. No evidence of tricuspid stenosis. Aortic Valve: The aortic valve is calcified. There is mild calcification of the aortic valve. There is mild thickening of the aortic valve. Aortic valve regurgitation is mild to moderate. Mild aortic stenosis is present. Aortic valve mean gradient measures 12.0  mmHg. Aortic valve peak  gradient measures 25.2 mmHg. Aortic valve area, by VTI measures 1.41 cm. Pulmonic Valve: The pulmonic valve was normal in structure. Pulmonic valve regurgitation is not visualized. No evidence of pulmonic stenosis. Aorta: Aortic dilatation noted. There is mild dilatation of the ascending aorta, measuring 41 mm. Venous: The inferior vena cava is normal in size with greater than 50% respiratory variability, suggesting right atrial pressure of 3 mmHg. IAS/Shunts: No atrial level shunt detected by color flow Doppler.  LEFT VENTRICLE PLAX 2D LVIDd:         4.40 cm      Diastology LVIDs:         3.60 cm      LV e' medial:    4.13 cm/s LV PW:         1.40 cm      LV E/e' medial:  14.3 LV IVS:        1.60 cm      LV e' lateral:   5.66 cm/s LVOT diam:     2.40 cm      LV E/e' lateral: 10.5 LV SV:         71 LV SV Index:   31 LVOT Area:     4.52 cm  LV Volumes (MOD) LV vol d, MOD A2C: 221.0 ml LV vol d, MOD A4C: 230.0 ml LV vol s, MOD A2C: 102.0 ml LV vol s, MOD A4C: 119.0 ml LV SV MOD A2C:     119.0 ml LV SV MOD A4C:     230.0 ml LV SV MOD BP:      116.7 ml RIGHT VENTRICLE RV S prime:     8.81 cm/s TAPSE (M-mode): 1.4 cm LEFT ATRIUM              Index LA diam:        4.10 cm  1.78 cm/m LA Vol (A2C):   110.0 ml 47.85 ml/m LA Vol (A4C):   55.2 ml  24.01 ml/m LA Biplane Vol: 78.2 ml  34.02 ml/m  AORTIC VALVE AV Area (Vmax):    1.48 cm AV Area (Vmean):   1.40 cm AV Area (VTI):     1.41 cm AV Vmax:           251.00 cm/s AV Vmean:          158.000 cm/s AV VTI:            0.508 m AV Peak Grad:      25.2 mmHg AV Mean Grad:      12.0 mmHg LVOT Vmax:         82.00 cm/s LVOT Vmean:        49.000 cm/s LVOT VTI:          0.158 m LVOT/AV VTI ratio: 0.31  AORTA Ao Asc diam: 4.10 cm MITRAL VALVE               TRICUSPID VALVE MV Area (PHT): 2.17 cm    TR Peak grad:   12.7 mmHg MV Decel Time: 349 msec    TR Vmax:        178.00 cm/s MV E velocity: 59.20 cm/s MV A velocity: 91.70 cm/s  SHUNTS MV E/A ratio:   0.65        Systemic VTI:  0.16 m                            Systemic Diam: 2.40 cm Maudine Sos MD Electronically  signed by Maudine Sos MD Signature Date/Time: 03/24/2024/5:27:41 PM    Final    MR BRAIN WO CONTRAST Result Date: 03/23/2024 CLINICAL DATA:  Follow-up examination for stroke. EXAM: MRI HEAD WITHOUT CONTRAST TECHNIQUE: Multiplanar, multiecho pulse sequences of the brain and surrounding structures were obtained without intravenous contrast. COMPARISON:  Prior CTs from 03/22/2024. FINDINGS: Brain: Mild age-related cerebral atrophy. No significant cerebral white matter disease for age. Encephalomalacia and gliosis involving the left temporal occipital region, consistent with a chronic ischemic infarct. Just superior to this within the left parietal lobe, there is a confluent area of restricted diffusion involving the cortical subcortical aspect of the left parietal lobe, consistent with an evolving acute posterior left MCA distribution infarct. Area of infarction measures up to 6 cm in greatest diameter. No significant regional mass effect. Single small focus of associated petechial hemorrhage without hemorrhagic transformation (series 7, image 18). Additionally, subtle 2 cm linear focus of diffusion signal abnormality involving the right cerebellum, consistent with a tiny acute ischemic infarct (series 3, image 18). No associated hemorrhage or mass effect. No other evidence for acute or subacute ischemia. Gray-white matter differentiation otherwise maintained. No acute or significant chronic intracranial blood products. No mass lesion, midline shift or mass effect. No hydrocephalus or extra-axial fluid collection. Pituitary gland within normal limits. Vascular: Major intracranial vascular flow voids are maintained. Skull and upper cervical spine: Craniocervical junction within normal limits. Bone marrow signal intensity normal. No scalp soft tissue abnormality. Sinuses/Orbits: Prior bilateral  ocular lens replacement. Right maxillary sinus retention cyst. Paranasal sinuses are otherwise largely clear. No mastoid effusion. Other: None. IMPRESSION: 1. 6 cm evolving acute posterior left MCA distribution infarct involving the left parietal lobe. Single small focus of associated petechial hemorrhage without hemorrhagic transformation. No significant regional mass effect. 2. 2 cm acute ischemic nonhemorrhagic right cerebellar infarct. 3. Chronic left temporoccipital infarct. 4. Underlying mild age-related cerebral atrophy. Electronically Signed   By: Virgia Griffins M.D.   On: 03/23/2024 19:26   CT ANGIO HEAD NECK W WO CM W PERF (CODE STROKE) Result Date: 03/22/2024 CLINICAL DATA:  Stroke workup EXAM: CT ANGIOGRAPHY HEAD AND NECK CT PERFUSION BRAIN TECHNIQUE: Multidetector CT imaging of the head and neck was performed using the standard protocol during bolus administration of intravenous contrast. Multiplanar CT image reconstructions and MIPs were obtained to evaluate the vascular anatomy. Carotid stenosis measurements (when applicable) are obtained utilizing NASCET criteria, using the distal internal carotid diameter as the denominator. Multiphase CT imaging of the brain was performed following IV bolus contrast injection. Subsequent parametric perfusion maps were calculated using RAPID software. RADIATION DOSE REDUCTION: This exam was performed according to the departmental dose-optimization program which includes automated exposure control, adjustment of the mA and/or kV according to patient size and/or use of iterative reconstruction technique. CONTRAST:  OMNIPAQUE  IOHEXOL  350 MG/ML SOLN COMPARISON:  Noncontrast head CT from earlier today FINDINGS: CTA NECK FINDINGS Aortic arch: Extensive atheromatous plaque, 2 vessel branching Right carotid system: Moderate atheromatous plaque at the bifurcation without flow reducing stenosis, ulceration, or beading Left carotid system: Moderate atheromatous  plaque at the bifurcation without stenosis, ulceration, or beading. Vertebral arteries: No proximal subclavian stenosis. Calcified plaque at the right vertebral origin causing mild-to-moderate stenosis. Robust flow in the left vertebral artery which is dominant. Skeleton: No acute finding Other neck: Goiter with asymmetric heterogeneous enlargement of the left lobe pattern unchanged from 2023 CT of the neck. Low-density nodule with calcification in the right lower internal jugular vein attributed to  nonocclusive, chronic thrombus. Upper chest: No acute finding Review of the MIP images confirms the above findings CTA HEAD FINDINGS Anterior circulation: No major branch occlusion, beading, or proximal flow reducing stenosis. Mild atheromatous calcification at the cavernous carotids. Posterior circulation: The vertebral arteries show atheromatous calcification. No flow reducing stenosis seen in the vertebral or basilar circulation. No branch occlusion. Venous sinuses: Unremarkable Anatomic variants: None significant Review of the MIP images confirms the above findings CT Brain Perfusion Findings: CBF (<30%) Volume: 38mL-this has a early CT correlate. Perfusion (Tmax>6.0s) volume: 21mL Mismatch Volume: 15mL Case discussed with Dr. Lindzen at time of dictation. IMPRESSION: No emergent large vessel occlusion. 6 cc acute, completed infarct in the superior left parietal cortex. There is 15 cc of adjacent penumbra intervening this infarct and a pre-existing left parietal infarct which is chronic. The affected peripheral branch vessel is not visualized. Atherosclerosis without flow reducing stenosis or embolic source seen in the proximal vasculature. Electronically Signed   By: Ronnette Coke M.D.   On: 03/22/2024 11:24   CT HEAD CODE STROKE WO CONTRAST` Result Date: 03/22/2024 CLINICAL DATA:  Code stroke. Neuro deficit, concern for stroke, aphasia. EXAM: CT HEAD WITHOUT CONTRAST TECHNIQUE: Contiguous axial images were  obtained from the base of the skull through the vertex without intravenous contrast. RADIATION DOSE REDUCTION: This exam was performed according to the departmental dose-optimization program which includes automated exposure control, adjustment of the mA and/or kV according to patient size and/or use of iterative reconstruction technique. COMPARISON:  CT head 11/30/2020, MRI head 06/13/2018. FINDINGS: Brain: No acute intracranial hemorrhage. No CT evidence of acute infarct. Redemonstrated remote infarct involving the left temporal occipital cortex and subcortical white matter. Nonspecific hypoattenuation in the periventricular and subcortical white matter favored to reflect chronic microvascular ischemic changes. No edema, mass effect, or midline shift. The basilar cisterns are patent. Ventricles: Prominence of the ventricles suggesting underlying parenchymal volume loss. Vascular: Atherosclerotic calcifications of the carotid siphons and intracranial vertebral arteries. No hyperdense vessel. Skull: No acute or aggressive finding. Orbits: Orbits are symmetric. Sinuses: Mucous retention cyst in the right maxillary sinus. Other: Mastoid air cells are clear. 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 ASPECTS [facility] Stroke Program Early CT Score) - Ganglionic level infarction (caudate, lentiform nuclei, internal capsule, insula, M1-M3 cortex): 7 - Supraganglionic infarction (M4-M6 cortex): 3 Total score (0-10 with 10 being normal): 10 IMPRESSION: 1. No CT evidence of acute intracranial abnormality. 2. Redemonstrated remote infarct involving the left temporal occipital lobes. 3. Mild chronic microvascular ischemic changes and mild parenchymal volume loss. 4. ASPECTS is 10 These results were communicated to Dr. Lindzen At 11:07 am on 03/22/2024 by text page via the [facility] messaging system. Electronically Signed   By: Denny Flack M.D.   On: 03/22/2024 11:07     TODAY-DAY OF  DISCHARGE:  Subjective:   Jesus Lam today has no headache,no chest abdominal pain,no new weakness tingling or numbness, feels much better wants to go home today.   Objective:   Blood pressure 103/83, pulse 86, temperature 98.2 F (36.8 C), temperature source Oral, resp. rate 16, SpO2 95%. No intake or output data in the 24 hours ending 03/26/24 0846 There were no vitals filed for this visit.  Exam: Awake Alert, Oriented *3, No new F.N deficits, Normal affect [place].AT,PERRAL Supple Neck,No JVD, No cervical lymphadenopathy appriciated.  Symmetrical Chest wall movement, Good air movement bilaterally, CTAB RRR,No Gallops,Rubs or new Murmurs, No Parasternal Heave +ve B.Sounds, Abd Soft, Non tender, No  organomegaly appriciated, No rebound -guarding or rigidity. No Cyanosis, Clubbing or edema, No new Rash or bruise   PERTINENT RADIOLOGIC STUDIES: ECHOCARDIOGRAM COMPLETE Result Date: 03/24/2024    ECHOCARDIOGRAM REPORT   Patient Name:   Jesus Lam Date of Exam: 03/24/2024 Medical Rec #:  [id number]       Height:       72.0 in Accession #:    [id number]      Weight:       239.2 lb Date of Birth:  01/03/1933        BSA:          2.299 m Patient Age:    89 years        BP:           159/84 mmHg Patient Gender: M               HR:           51 bpm. Exam Location:  Inpatient Procedure: 2D Echo, Color Doppler and Cardiac Doppler (Both Spectral and Color            Flow Doppler were utilized during procedure). Indications:    Stroke  History:        Patient has prior history of Echocardiogram examinations, most                 recent 06/13/2018.  Sonographer:    Andrena Bang Referring Phys: Mayo Speck Estil Heman Analyn Matusek IMPRESSIONS  1. Left ventricular ejection fraction, by estimation, is 45 to 50%. The left ventricle has mildly decreased function. The left ventricle demonstrates global hypokinesis. There is moderate concentric left ventricular hypertrophy. Left ventricular diastolic parameters are consistent  with Grade I diastolic dysfunction (impaired relaxation).  2. Right ventricular systolic function is normal. The right ventricular size is normal. There is normal pulmonary artery systolic pressure.  3. Left atrial size was moderately dilated.  4. The mitral valve is normal in structure. Trivial mitral valve regurgitation. No evidence of mitral stenosis.  5. The aortic valve is calcified. There is mild calcification of the aortic valve. There is mild thickening of the aortic valve. Aortic valve regurgitation is mild to moderate. Mild aortic valve stenosis. Aortic valve area, by VTI measures 1.41 cm. Aortic valve mean gradient measures 12.0 mmHg. Aortic valve Vmax measures 2.51 m/s.  6. Aortic dilatation noted. There is mild dilatation of the ascending aorta, measuring 41 mm.  7. The inferior vena cava is normal in size with greater than 50% respiratory variability, suggesting right atrial pressure of 3 mmHg. FINDINGS  Left Ventricle: Left ventricular ejection fraction, by estimation, is 45 to 50%. The left ventricle has mildly decreased function. The left ventricle demonstrates global hypokinesis. The left ventricular internal cavity size was normal in size. There is  moderate concentric left ventricular hypertrophy. Left ventricular diastolic parameters are consistent with Grade I diastolic dysfunction (impaired relaxation). Indeterminate filling pressures. Right Ventricle: The right ventricular size is normal. No increase in right ventricular wall thickness. Right ventricular systolic function is normal. There is normal pulmonary artery systolic pressure. The tricuspid regurgitant velocity is 1.78 m/s, and  with an assumed right atrial pressure of 3 mmHg, the estimated right ventricular systolic pressure is 15.7 mmHg. Left Atrium: Left atrial size was moderately dilated. Right Atrium: Right atrial size was normal in size. Pericardium: There is no evidence of pericardial effusion. Mitral Valve: The mitral valve is  normal in structure. Trivial mitral valve regurgitation. No evidence of mitral valve stenosis.  Tricuspid Valve: The tricuspid valve is normal in structure. Tricuspid valve regurgitation is trivial. No evidence of tricuspid stenosis. Aortic Valve: The aortic valve is calcified. There is mild calcification of the aortic valve. There is mild thickening of the aortic valve. Aortic valve regurgitation is mild to moderate. Mild aortic stenosis is present. Aortic valve mean gradient measures 12.0 mmHg. Aortic valve peak gradient measures 25.2 mmHg. Aortic valve area, by VTI measures 1.41 cm. Pulmonic Valve: The pulmonic valve was normal in structure. Pulmonic valve regurgitation is not visualized. No evidence of pulmonic stenosis. Aorta: Aortic dilatation noted. There is mild dilatation of the ascending aorta, measuring 41 mm. Venous: The inferior vena cava is normal in size with greater than 50% respiratory variability, suggesting right atrial pressure of 3 mmHg. IAS/Shunts: No atrial level shunt detected by color flow Doppler.  LEFT VENTRICLE PLAX 2D LVIDd:         4.40 cm      Diastology LVIDs:         3.60 cm      LV e' medial:    4.13 cm/s LV PW:         1.40 cm      LV E/e' medial:  14.3 LV IVS:        1.60 cm      LV e' lateral:   5.66 cm/s LVOT diam:     2.40 cm      LV E/e' lateral: 10.5 LV SV:         71 LV SV Index:   31 LVOT Area:     4.52 cm  LV Volumes (MOD) LV vol d, MOD A2C: 221.0 ml LV vol d, MOD A4C: 230.0 ml LV vol s, MOD A2C: 102.0 ml LV vol s, MOD A4C: 119.0 ml LV SV MOD A2C:     119.0 ml LV SV MOD A4C:     230.0 ml LV SV MOD BP:      116.7 ml RIGHT VENTRICLE RV S prime:     8.81 cm/s TAPSE (M-mode): 1.4 cm LEFT ATRIUM              Index LA diam:        4.10 cm  1.78 cm/m LA Vol (A2C):   110.0 ml 47.85 ml/m LA Vol (A4C):   55.2 ml  24.01 ml/m LA Biplane Vol: 78.2 ml  34.02 ml/m  AORTIC VALVE AV Area (Vmax):    1.48 cm AV Area (Vmean):   1.40 cm AV Area (VTI):     1.41 cm AV Vmax:            251.00 cm/s AV Vmean:          158.000 cm/s AV VTI:            0.508 m AV Peak Grad:      25.2 mmHg AV Mean Grad:      12.0 mmHg LVOT Vmax:         82.00 cm/s LVOT Vmean:        49.000 cm/s LVOT VTI:          0.158 m LVOT/AV VTI ratio: 0.31  AORTA Ao Asc diam: 4.10 cm MITRAL VALVE               TRICUSPID VALVE MV Area (PHT): 2.17 cm    TR Peak grad:   12.7 mmHg MV Decel Time: 349 msec    TR Vmax:        178.00 cm/s  MV E velocity: 59.20 cm/s MV A velocity: 91.70 cm/s  SHUNTS MV E/A ratio:  0.65        Systemic VTI:  0.16 m                            Systemic Diam: 2.40 cm Maudine Sos MD Electronically signed by Maudine Sos MD Signature Date/Time: 03/24/2024/5:27:41 PM    Final      PERTINENT LAB RESULTS: CBC: No results for input(s): "WBC", "HGB", "HCT", "PLT" in the last 72 hours. CMET CMP     Component Value Date/Time   NA 145 03/22/2024 1039   NA 143 09/17/2023 1507   K 3.9 03/22/2024 1039   CL 112 (H) 03/22/2024 1039   CO2 22 03/22/2024 1033   GLUCOSE 103 (H) 03/22/2024 1039   BUN 17 03/22/2024 1039   BUN 23 09/17/2023 1507   CREATININE 1.20 03/22/2024 1039   CALCIUM  9.5 03/22/2024 1033   PROT 7.6 03/22/2024 1033   PROT 7.2 01/20/2023 1255   ALBUMIN 3.3 (L) 03/22/2024 1033   ALBUMIN 4.0 01/20/2023 1255   AST 21 03/22/2024 1033   ALT 15 03/22/2024 1033   ALKPHOS 72 03/22/2024 1033   BILITOT 0.7 03/22/2024 1033   BILITOT 0.5 01/20/2023 1255   EGFR 59 (L) 09/17/2023 1507   GFRNONAA >60 03/22/2024 1033    GFR CrCl cannot be calculated (Unknown ideal weight.). No results for input(s): "LIPASE", "AMYLASE" in the last 72 hours. No results for input(s): "CKTOTAL", "CKMB", "CKMBINDEX", "TROPONINI" in the last 72 hours. Invalid input(s): "POCBNP" No results for input(s): "DDIMER" in the last 72 hours. No results for input(s): "HGBA1C" in the last 72 hours.  No results for input(s): "CHOL", "HDL", "LDLCALC", "TRIG", "CHOLHDL", "LDLDIRECT" in the last 72 hours.  No  results for input(s): "TSH", "T4TOTAL", "T3FREE", "THYROIDAB" in the last 72 hours.  Invalid input(s): "FREET3"  No results for input(s): "VITAMINB12", "FOLATE", "FERRITIN", "TIBC", "IRON", "RETICCTPCT" in the last 72 hours. Coags: No results for input(s): "INR" in the last 72 hours.  Invalid input(s): "PT" Microbiology: No results found for this or any previous visit (from the past 240 hours).  FURTHER DISCHARGE INSTRUCTIONS:  Get Medicines reviewed and adjusted: Please take all your medications with you for your next visit with your Primary MD  Laboratory/radiological data: Please request your Primary MD to go over all hospital tests and procedure/radiological results at the follow up, please ask your Primary MD to get all Hospital records sent to his/her office.  In some cases, they will be blood work, cultures and biopsy results pending at the time of your discharge. Please request that your primary care M.D. goes through all the records of your hospital data and follows up on these results.  Also Note the following: If you experience worsening of your admission symptoms, develop shortness of breath, life threatening emergency, suicidal or homicidal thoughts you must seek medical attention immediately by calling 911 or calling your MD immediately  if symptoms less severe.  You must read complete instructions/literature along with all the possible adverse reactions/side effects for all the Medicines you take and that have been prescribed to you. Take any new Medicines after you have completely understood and accpet all the possible adverse reactions/side effects.   Do not drive when taking Pain medications or sleeping medications (Benzodaizepines)  Do not take more than prescribed Pain, Sleep and Anxiety Medications. It is not advisable to combine anxiety,sleep and pain  medications without talking with your primary care practitioner  Special Instructions: If you have smoked or  chewed Tobacco  in the last 2 yrs please stop smoking, stop any regular Alcohol  and or any Recreational drug use.  Wear Seat belts while driving.  Please note: You were cared for by a hospitalist during your hospital stay. Once you are discharged, your primary care physician will handle any further medical issues. Please note that NO REFILLS for any discharge medications will be authorized once you are discharged, as it is imperative that you return to your primary care physician (or establish a relationship with a primary care physician if you do not have one) for your post hospital discharge needs so that they can reassess your need for medications and monitor your lab values.  Total Time spent coordinating discharge including counseling, education and face to face time equals greater than 30 minutes.  SignedKimberly Penna 03/26/2024 8:46 AM

## 2024-03-25 NOTE — Plan of Care (Signed)

## 2024-03-26 DIAGNOSIS — N401 Enlarged prostate with lower urinary tract symptoms: Secondary | ICD-10-CM | POA: Diagnosis not present

## 2024-03-26 DIAGNOSIS — Z7901 Long term (current) use of anticoagulants: Secondary | ICD-10-CM | POA: Diagnosis not present

## 2024-03-26 DIAGNOSIS — I6350 Cerebral infarction due to unspecified occlusion or stenosis of unspecified cerebral artery: Secondary | ICD-10-CM | POA: Diagnosis not present

## 2024-03-26 DIAGNOSIS — I619 Nontraumatic intracerebral hemorrhage, unspecified: Secondary | ICD-10-CM | POA: Diagnosis not present

## 2024-03-26 DIAGNOSIS — I6789 Other cerebrovascular disease: Secondary | ICD-10-CM | POA: Diagnosis not present

## 2024-03-26 DIAGNOSIS — Z5189 Encounter for other specified aftercare: Secondary | ICD-10-CM | POA: Diagnosis not present

## 2024-03-26 DIAGNOSIS — M6281 Muscle weakness (generalized): Secondary | ICD-10-CM | POA: Diagnosis not present

## 2024-03-26 DIAGNOSIS — I5020 Unspecified systolic (congestive) heart failure: Secondary | ICD-10-CM | POA: Diagnosis not present

## 2024-03-26 DIAGNOSIS — R278 Other lack of coordination: Secondary | ICD-10-CM | POA: Diagnosis not present

## 2024-03-26 DIAGNOSIS — F03A0 Unspecified dementia, mild, without behavioral disturbance, psychotic disturbance, mood disturbance, and anxiety: Secondary | ICD-10-CM | POA: Diagnosis not present

## 2024-03-26 DIAGNOSIS — R4701 Aphasia: Secondary | ICD-10-CM | POA: Diagnosis not present

## 2024-03-26 DIAGNOSIS — G3184 Mild cognitive impairment, so stated: Secondary | ICD-10-CM | POA: Diagnosis not present

## 2024-03-26 DIAGNOSIS — R001 Bradycardia, unspecified: Secondary | ICD-10-CM | POA: Diagnosis not present

## 2024-03-26 DIAGNOSIS — E0590 Thyrotoxicosis, unspecified without thyrotoxic crisis or storm: Secondary | ICD-10-CM | POA: Diagnosis not present

## 2024-03-26 DIAGNOSIS — R2681 Unsteadiness on feet: Secondary | ICD-10-CM | POA: Diagnosis not present

## 2024-03-26 DIAGNOSIS — R531 Weakness: Secondary | ICD-10-CM | POA: Diagnosis not present

## 2024-03-26 DIAGNOSIS — E782 Mixed hyperlipidemia: Secondary | ICD-10-CM | POA: Diagnosis not present

## 2024-03-26 DIAGNOSIS — I10 Essential (primary) hypertension: Secondary | ICD-10-CM | POA: Diagnosis not present

## 2024-03-26 DIAGNOSIS — I5022 Chronic systolic (congestive) heart failure: Secondary | ICD-10-CM | POA: Diagnosis not present

## 2024-03-26 DIAGNOSIS — Z7401 Bed confinement status: Secondary | ICD-10-CM | POA: Diagnosis not present

## 2024-03-26 DIAGNOSIS — N390 Urinary tract infection, site not specified: Secondary | ICD-10-CM | POA: Diagnosis not present

## 2024-03-26 DIAGNOSIS — I69320 Aphasia following cerebral infarction: Secondary | ICD-10-CM | POA: Diagnosis not present

## 2024-03-26 DIAGNOSIS — I639 Cerebral infarction, unspecified: Secondary | ICD-10-CM | POA: Diagnosis not present

## 2024-03-26 DIAGNOSIS — M6259 Muscle wasting and atrophy, not elsewhere classified, multiple sites: Secondary | ICD-10-CM | POA: Diagnosis not present

## 2024-03-26 DIAGNOSIS — N400 Enlarged prostate without lower urinary tract symptoms: Secondary | ICD-10-CM | POA: Diagnosis not present

## 2024-03-26 NOTE — TOC Progression Note (Addendum)
 Transition of Care [facility]) - Progression Note    Patient Details  Name: Jesus Lam MRN: [id number] Date of Birth: 1933-06-19  [facility]) CM/SW Contact  Jannice Mends, LCSW Phone Number: 03/26/2024, 8:42 AM  Clinical Narrative:    Insurance approval received for [facility], Ref# [id number], Auth ID# [id number], effective 03/25/2024-03/29/2024.  [name] is ready for patient. CSW updated patient's daughter who requested PTAR for patient.    Expected Discharge Plan: Skilled Nursing Facility Barriers to Discharge: Barriers Resolved  Expected Discharge Plan and Services       Living arrangements for the past 2 months: Single Family Home                                       Social Determinants of Health (SDOH) Interventions SDOH Screenings   Food Insecurity: No Food Insecurity (03/22/2024)  Housing: Low Risk  (03/22/2024)  Transportation Needs: No Transportation Needs (03/22/2024)  Utilities: Not At Risk (03/22/2024)  Alcohol Screen: Low Risk  (06/04/2023)  Depression (PHQ2-9): Low Risk  (08/21/2023)  Financial Resource Strain: Low Risk  (06/04/2023)  Physical Activity: Sufficiently Active (06/04/2023)  Recent Concern: Physical Activity - Insufficiently Active (06/04/2023)  Social Connections: Moderately Integrated (03/22/2024)  Stress: No Stress Concern Present (06/04/2023)  Tobacco Use: Medium Risk (03/22/2024)    Readmission Risk Interventions     No data to display

## 2024-03-26 NOTE — Progress Notes (Signed)
 Occupational Therapy Treatment Patient Details Name: Jesus Lam MRN: [id number] DOB: 02-21-1933 Today's Date: 03/26/2024   History of present illness Patient is a 89 y.o.  male admitted 4/28 presented with new onset expressive aphasia.   Left superior parietal infarct.  PMH: HTN, HLD, VTE on Eliquis , CVA 2019   OT comments  Patient is making progress toward goals.  Patient completed sit to stand from bedside chair with minA  with verbal cues to push up from seated surface to power up to standing position 2/2 patient intially attempting to pull up from RW.  Patient requires min A with RW for functional mobility to sink 2/2 patient pushing RW far in front of him and not staying within RW initially, which was corrected but minA  for overall safety and RW usage 2/2 patient running into end of bed with RW on the R side with verbal cues to attend to R side.  Patient stood at sink and completed oral hygiene with minA ; verbal cues for sequencing 2/2 patient forgetting to place toothpaste on toothbrush along with utilizing R hand for functional task versus relying on L UE.  Patient noted to have slight ataxia when reaching for paper towel to dry face although successful after 3 attempts and verbal cues to slow down attempted movements.  Verbal cues also provided for hand placement on RW, to make sure R hand was secure on RW before completing functional mobility  Patient returned to bedside chair with minA and use fo RW.  Patient would benefit from additional OT intervention to address functional deficits in order for patient to return to PLOF.      If plan is discharge home, recommend the following:  A little help with walking and/or transfers;A lot of help with bathing/dressing/bathroom;Assistance with cooking/housework;Direct supervision/assist for financial management;Direct supervision/assist for medications management;Assist for transportation;Help with stairs or ramp for entrance;Supervision due to  cognitive status   Equipment Recommendations  Other (comment)    Recommendations for Other Services PT consult;Rehab consult    Precautions / Restrictions Precautions Precautions: Fall Restrictions Weight Bearing Restrictions Per Provider Order: No       Mobility Bed Mobility                    Transfers Overall transfer level: Needs assistance Equipment used: Rolling walker (2 wheels) Transfers: Sit to/from Stand Sit to Stand: Min assist           General transfer comment:  (verba cues reached to push up from chair versus grabbing onto RW to pul self to standing position)     Balance Overall balance assessment: Needs assistance Sitting-balance support: No upper extremity supported, Feet supported Sitting balance-Leahy Scale: Good     Standing balance support: Bilateral upper extremity supported, During functional activity (on sink counter)   Standing balance comment: relies on UE support and external support                           ADL either performed or assessed with clinical judgement   ADL Overall ADL's : Needs assistance/impaired     Grooming: Wash/dry face;Oral care;Minimal assistance (patient note to have slight ataxia when reaching to papaer towl to dry mouth and to maintain grasp on toothbrush when rinsing under water.   verbal cues for sequencing of placing toothpaste on toothbrush before placing toothbrush in mouth)  Extremity/Trunk Assessment              Vision       Restaurant manager, fast food Communication: Impaired   Cognition Arousal: Alert Behavior During Therapy: Impulsive             Executive functioning impairment (select all impairments): Problem solving                   Following commands: Impaired Following commands impaired: Follows one step commands with increased time      Cueing   Cueing Techniques:  Verbal cues, Tactile cues  Exercises      Shoulder Instructions       General Comments      Pertinent Vitals/ Pain          Home Living                                          Prior Functioning/Environment              Frequency  Min 2X/week        Progress Toward Goals  OT Goals(current goals can now be found in the care plan section)  Progress towards OT goals: Progressing toward goals  Acute Rehab OT Goals OT Goal Formulation: With patient Time For Goal Achievement: 04/06/24 Potential to Achieve Goals: Good ADL Goals Pt Will Perform Upper Body Dressing: with modified independence;sitting Pt Will Perform Lower Body Dressing: with modified independence;sitting/lateral leans;sit to/from stand Pt Will Transfer to Toilet: with modified independence;ambulating;regular height toilet Pt Will Perform Tub/Shower Transfer: Tub transfer;Shower transfer;with modified independence;ambulating Pt/caregiver will Perform Home Exercise Program: Increased ROM;Increased strength;Right Upper extremity;With Supervision;With written HEP provided Additional ADL Goal #1: pt will locate items on R side with 80% accuracy and min cues in order to improve R inattention  Plan      Co-evaluation                 AM-PAC OT "6 Clicks" Daily Activity     Outcome Measure   Help from another person eating meals?: A Little Help from another person taking care of personal grooming?: A Little Help from another person toileting, which includes using toliet, bedpan, or urinal?: A Lot Help from another person bathing (including washing, rinsing, drying)?: A Lot Help from another person to put on and taking off regular upper body clothing?: A Little Help from another person to put on and taking off regular lower body clothing?: A Lot 6 Click Score: 15    End of Session Equipment Utilized During Treatment: Rolling walker (2 wheels);Gait belt (patient requires verbal cues  and tactile cues to safe RW mgmt 2/2 patinet getting too close to items in room on R side of body and also getting RW hung up on end of bed 2/2 patient unable to safely complete object negotiation)  OT Visit Diagnosis: Unsteadiness on feet (R26.81);Other abnormalities of gait and mobility (R26.89);Muscle weakness (generalized) (M62.81);Other symptoms and signs involving the nervous system (R29.898);Other symptoms and signs involving cognitive function;Cognitive communication deficit (R41.841)   Activity Tolerance Patient tolerated treatment well   Patient Left with call bell/phone within reach;with chair alarm set;with family/visitor present   Nurse Communication Mobility status        Time: 4010-2725 OT Time Calculation (min): 23 min  Charges: OT General Charges $OT Visit:  1 Visit OT Treatments $Self Care/Home Management : 23-37 mins  Lovett Ruck OT/L  Lacretia Piccolo 03/26/2024, 12:11 PM

## 2024-03-26 NOTE — Plan of Care (Signed)

## 2024-03-26 NOTE — TOC Transition Note (Addendum)
 Transition of Care [facility]) - Discharge Note   Patient Details  Name: Jesus Lam MRN: [id number] Date of Birth: [birth year]  [facility] CM/SW Contact:  Jannice Mends, LCSW Phone Number: 03/26/2024, 11:30 AM   Clinical Narrative:    Patient will DC to: [facility]  Anticipated DC date: 03/26/24 Family notified: Daughter, Estate manager/land agent by: Lyna Sandhoff, called 11:31 AM   Per MD patient ready for DC to [facility]. RN to call report prior to discharge [phone] room 801B). RN, patient, patient's family, and facility notified of DC. Discharge Summary and FL2 sent to facility. DC packet on chart. Ambulance transport requested for patient.   CSW will sign off for now as social work intervention is no longer needed. Please consult us  again if new needs arise.     Final next level of care: Skilled Nursing Facility Barriers to Discharge: Barriers Resolved   Patient Goals and CMS Choice Patient states their goals for this hospitalization and ongoing recovery are:: rehab CMS Medicare.gov Compare Post Acute Care list provided to:: Patient Choice offered to / list presented to : Patient, Adult Children [facility] ownership interest in [facility].provided to:: Adult Children    Discharge Placement   Existing PASRR number confirmed : 03/26/24          Patient chooses bed at: [facility] Patient to be transferred to facility by: PTAR Name of family member notified: Daughter Patient and family notified of of transfer: 03/26/24  Discharge Plan and Services Additional resources added to the After Visit Summary for                                       Social Drivers of Health (SDOH) Interventions SDOH Screenings   Food Insecurity: No Food Insecurity (03/22/2024)  Housing: Low Risk  (03/22/2024)  Transportation Needs: No Transportation Needs (03/22/2024)  Utilities: Not At Risk (03/22/2024)  Alcohol Screen: Low Risk  (06/04/2023)  Depression (PHQ2-9): Low  Risk  (08/21/2023)  Financial Resource Strain: Low Risk  (06/04/2023)  Physical Activity: Sufficiently Active (06/04/2023)  Recent Concern: Physical Activity - Insufficiently Active (06/04/2023)  Social Connections: Moderately Integrated (03/22/2024)  Stress: No Stress Concern Present (06/04/2023)  Tobacco Use: Medium Risk (03/22/2024)     Readmission Risk Interventions     No data to display

## 2024-03-26 NOTE — Progress Notes (Signed)
 AVS completed; copy placed with chart at nurses station.

## 2024-03-29 ENCOUNTER — Encounter: Payer: Self-pay | Admitting: *Deleted

## 2024-03-29 ENCOUNTER — Other Ambulatory Visit: Payer: Self-pay | Admitting: Cardiology

## 2024-03-29 DIAGNOSIS — I639 Cerebral infarction, unspecified: Secondary | ICD-10-CM | POA: Diagnosis not present

## 2024-03-29 DIAGNOSIS — E782 Mixed hyperlipidemia: Secondary | ICD-10-CM | POA: Diagnosis not present

## 2024-03-29 DIAGNOSIS — R001 Bradycardia, unspecified: Secondary | ICD-10-CM

## 2024-03-29 DIAGNOSIS — N400 Enlarged prostate without lower urinary tract symptoms: Secondary | ICD-10-CM | POA: Diagnosis not present

## 2024-03-29 DIAGNOSIS — I10 Essential (primary) hypertension: Secondary | ICD-10-CM | POA: Diagnosis not present

## 2024-03-29 DIAGNOSIS — F03A0 Unspecified dementia, mild, without behavioral disturbance, psychotic disturbance, mood disturbance, and anxiety: Secondary | ICD-10-CM | POA: Diagnosis not present

## 2024-03-29 DIAGNOSIS — I455 Other specified heart block: Secondary | ICD-10-CM

## 2024-03-30 DIAGNOSIS — M6281 Muscle weakness (generalized): Secondary | ICD-10-CM | POA: Diagnosis not present

## 2024-03-30 DIAGNOSIS — G3184 Mild cognitive impairment, so stated: Secondary | ICD-10-CM | POA: Diagnosis not present

## 2024-03-30 DIAGNOSIS — I69320 Aphasia following cerebral infarction: Secondary | ICD-10-CM | POA: Diagnosis not present

## 2024-03-30 DIAGNOSIS — I10 Essential (primary) hypertension: Secondary | ICD-10-CM | POA: Diagnosis not present

## 2024-03-30 DIAGNOSIS — R2681 Unsteadiness on feet: Secondary | ICD-10-CM | POA: Diagnosis not present

## 2024-03-30 DIAGNOSIS — I639 Cerebral infarction, unspecified: Secondary | ICD-10-CM | POA: Diagnosis not present

## 2024-03-30 DIAGNOSIS — N400 Enlarged prostate without lower urinary tract symptoms: Secondary | ICD-10-CM | POA: Diagnosis not present

## 2024-03-30 DIAGNOSIS — R001 Bradycardia, unspecified: Secondary | ICD-10-CM | POA: Diagnosis not present

## 2024-03-30 DIAGNOSIS — I5020 Unspecified systolic (congestive) heart failure: Secondary | ICD-10-CM | POA: Diagnosis not present

## 2024-03-30 DIAGNOSIS — R278 Other lack of coordination: Secondary | ICD-10-CM | POA: Diagnosis not present

## 2024-03-31 ENCOUNTER — Other Ambulatory Visit (HOSPITAL_COMMUNITY): Payer: Self-pay

## 2024-03-31 DIAGNOSIS — I5020 Unspecified systolic (congestive) heart failure: Secondary | ICD-10-CM | POA: Diagnosis not present

## 2024-03-31 DIAGNOSIS — I639 Cerebral infarction, unspecified: Secondary | ICD-10-CM | POA: Diagnosis not present

## 2024-03-31 DIAGNOSIS — G3184 Mild cognitive impairment, so stated: Secondary | ICD-10-CM | POA: Diagnosis not present

## 2024-03-31 DIAGNOSIS — N401 Enlarged prostate with lower urinary tract symptoms: Secondary | ICD-10-CM | POA: Diagnosis not present

## 2024-03-31 DIAGNOSIS — I69320 Aphasia following cerebral infarction: Secondary | ICD-10-CM | POA: Diagnosis not present

## 2024-03-31 DIAGNOSIS — I10 Essential (primary) hypertension: Secondary | ICD-10-CM | POA: Diagnosis not present

## 2024-03-31 DIAGNOSIS — R001 Bradycardia, unspecified: Secondary | ICD-10-CM | POA: Diagnosis not present

## 2024-04-02 DIAGNOSIS — F03A0 Unspecified dementia, mild, without behavioral disturbance, psychotic disturbance, mood disturbance, and anxiety: Secondary | ICD-10-CM | POA: Diagnosis not present

## 2024-04-02 DIAGNOSIS — I10 Essential (primary) hypertension: Secondary | ICD-10-CM | POA: Diagnosis not present

## 2024-04-02 DIAGNOSIS — G3184 Mild cognitive impairment, so stated: Secondary | ICD-10-CM | POA: Diagnosis not present

## 2024-04-02 DIAGNOSIS — N400 Enlarged prostate without lower urinary tract symptoms: Secondary | ICD-10-CM | POA: Diagnosis not present

## 2024-04-02 DIAGNOSIS — I639 Cerebral infarction, unspecified: Secondary | ICD-10-CM | POA: Diagnosis not present

## 2024-04-02 DIAGNOSIS — M6281 Muscle weakness (generalized): Secondary | ICD-10-CM | POA: Diagnosis not present

## 2024-04-02 DIAGNOSIS — I5020 Unspecified systolic (congestive) heart failure: Secondary | ICD-10-CM | POA: Diagnosis not present

## 2024-04-02 DIAGNOSIS — R2681 Unsteadiness on feet: Secondary | ICD-10-CM | POA: Diagnosis not present

## 2024-04-02 DIAGNOSIS — R278 Other lack of coordination: Secondary | ICD-10-CM | POA: Diagnosis not present

## 2024-04-02 DIAGNOSIS — R001 Bradycardia, unspecified: Secondary | ICD-10-CM | POA: Diagnosis not present

## 2024-04-02 DIAGNOSIS — I69320 Aphasia following cerebral infarction: Secondary | ICD-10-CM | POA: Diagnosis not present

## 2024-04-02 DIAGNOSIS — E782 Mixed hyperlipidemia: Secondary | ICD-10-CM | POA: Diagnosis not present

## 2024-04-05 ENCOUNTER — Other Ambulatory Visit: Payer: Self-pay | Admitting: Cardiology

## 2024-04-05 ENCOUNTER — Other Ambulatory Visit: Payer: Self-pay | Admitting: Student-PharmD

## 2024-04-05 DIAGNOSIS — I10 Essential (primary) hypertension: Secondary | ICD-10-CM | POA: Diagnosis not present

## 2024-04-05 DIAGNOSIS — G3184 Mild cognitive impairment, so stated: Secondary | ICD-10-CM | POA: Diagnosis not present

## 2024-04-05 DIAGNOSIS — N401 Enlarged prostate with lower urinary tract symptoms: Secondary | ICD-10-CM | POA: Diagnosis not present

## 2024-04-05 DIAGNOSIS — Z5189 Encounter for other specified aftercare: Secondary | ICD-10-CM | POA: Diagnosis not present

## 2024-04-05 DIAGNOSIS — N390 Urinary tract infection, site not specified: Secondary | ICD-10-CM | POA: Diagnosis not present

## 2024-04-05 DIAGNOSIS — I82452 Acute embolism and thrombosis of left peroneal vein: Secondary | ICD-10-CM

## 2024-04-05 DIAGNOSIS — Z7901 Long term (current) use of anticoagulants: Secondary | ICD-10-CM | POA: Diagnosis not present

## 2024-04-05 DIAGNOSIS — R001 Bradycardia, unspecified: Secondary | ICD-10-CM | POA: Diagnosis not present

## 2024-04-05 DIAGNOSIS — I5020 Unspecified systolic (congestive) heart failure: Secondary | ICD-10-CM | POA: Diagnosis not present

## 2024-04-06 DIAGNOSIS — R278 Other lack of coordination: Secondary | ICD-10-CM | POA: Diagnosis not present

## 2024-04-06 DIAGNOSIS — M6281 Muscle weakness (generalized): Secondary | ICD-10-CM | POA: Diagnosis not present

## 2024-04-06 DIAGNOSIS — I639 Cerebral infarction, unspecified: Secondary | ICD-10-CM | POA: Diagnosis not present

## 2024-04-06 DIAGNOSIS — R2681 Unsteadiness on feet: Secondary | ICD-10-CM | POA: Diagnosis not present

## 2024-04-07 DIAGNOSIS — I6350 Cerebral infarction due to unspecified occlusion or stenosis of unspecified cerebral artery: Secondary | ICD-10-CM | POA: Diagnosis not present

## 2024-04-07 DIAGNOSIS — Z7901 Long term (current) use of anticoagulants: Secondary | ICD-10-CM | POA: Diagnosis not present

## 2024-04-07 DIAGNOSIS — N401 Enlarged prostate with lower urinary tract symptoms: Secondary | ICD-10-CM | POA: Diagnosis not present

## 2024-04-07 DIAGNOSIS — N390 Urinary tract infection, site not specified: Secondary | ICD-10-CM | POA: Diagnosis not present

## 2024-04-07 DIAGNOSIS — G3184 Mild cognitive impairment, so stated: Secondary | ICD-10-CM | POA: Diagnosis not present

## 2024-04-07 DIAGNOSIS — R001 Bradycardia, unspecified: Secondary | ICD-10-CM | POA: Diagnosis not present

## 2024-04-07 DIAGNOSIS — I5020 Unspecified systolic (congestive) heart failure: Secondary | ICD-10-CM | POA: Diagnosis not present

## 2024-04-07 DIAGNOSIS — I69320 Aphasia following cerebral infarction: Secondary | ICD-10-CM | POA: Diagnosis not present

## 2024-04-07 DIAGNOSIS — I10 Essential (primary) hypertension: Secondary | ICD-10-CM | POA: Diagnosis not present

## 2024-04-09 DIAGNOSIS — I5020 Unspecified systolic (congestive) heart failure: Secondary | ICD-10-CM | POA: Diagnosis not present

## 2024-04-09 DIAGNOSIS — I10 Essential (primary) hypertension: Secondary | ICD-10-CM | POA: Diagnosis not present

## 2024-04-09 DIAGNOSIS — R001 Bradycardia, unspecified: Secondary | ICD-10-CM | POA: Diagnosis not present

## 2024-04-09 DIAGNOSIS — Z5189 Encounter for other specified aftercare: Secondary | ICD-10-CM | POA: Diagnosis not present

## 2024-04-09 DIAGNOSIS — N390 Urinary tract infection, site not specified: Secondary | ICD-10-CM | POA: Diagnosis not present

## 2024-04-09 DIAGNOSIS — N401 Enlarged prostate with lower urinary tract symptoms: Secondary | ICD-10-CM | POA: Diagnosis not present

## 2024-04-09 DIAGNOSIS — G3184 Mild cognitive impairment, so stated: Secondary | ICD-10-CM | POA: Diagnosis not present

## 2024-04-09 DIAGNOSIS — Z7901 Long term (current) use of anticoagulants: Secondary | ICD-10-CM | POA: Diagnosis not present

## 2024-04-11 DIAGNOSIS — I5022 Chronic systolic (congestive) heart failure: Secondary | ICD-10-CM | POA: Diagnosis not present

## 2024-04-11 DIAGNOSIS — I130 Hypertensive heart and chronic kidney disease with heart failure and stage 1 through stage 4 chronic kidney disease, or unspecified chronic kidney disease: Secondary | ICD-10-CM | POA: Diagnosis not present

## 2024-04-11 DIAGNOSIS — I739 Peripheral vascular disease, unspecified: Secondary | ICD-10-CM | POA: Diagnosis not present

## 2024-04-11 DIAGNOSIS — H9190 Unspecified hearing loss, unspecified ear: Secondary | ICD-10-CM | POA: Diagnosis not present

## 2024-04-11 DIAGNOSIS — N390 Urinary tract infection, site not specified: Secondary | ICD-10-CM | POA: Diagnosis not present

## 2024-04-11 DIAGNOSIS — I69320 Aphasia following cerebral infarction: Secondary | ICD-10-CM | POA: Diagnosis not present

## 2024-04-11 DIAGNOSIS — N189 Chronic kidney disease, unspecified: Secondary | ICD-10-CM | POA: Diagnosis not present

## 2024-04-11 DIAGNOSIS — F02A0 Dementia in other diseases classified elsewhere, mild, without behavioral disturbance, psychotic disturbance, mood disturbance, and anxiety: Secondary | ICD-10-CM | POA: Diagnosis not present

## 2024-04-11 DIAGNOSIS — G3184 Mild cognitive impairment, so stated: Secondary | ICD-10-CM | POA: Diagnosis not present

## 2024-04-12 ENCOUNTER — Telehealth: Payer: Self-pay

## 2024-04-12 ENCOUNTER — Other Ambulatory Visit: Payer: Self-pay

## 2024-04-12 DIAGNOSIS — I639 Cerebral infarction, unspecified: Secondary | ICD-10-CM | POA: Diagnosis not present

## 2024-04-12 DIAGNOSIS — I5022 Chronic systolic (congestive) heart failure: Secondary | ICD-10-CM | POA: Diagnosis not present

## 2024-04-12 DIAGNOSIS — E7800 Pure hypercholesterolemia, unspecified: Secondary | ICD-10-CM | POA: Diagnosis not present

## 2024-04-12 DIAGNOSIS — N390 Urinary tract infection, site not specified: Secondary | ICD-10-CM | POA: Diagnosis not present

## 2024-04-12 MED ORDER — METHIMAZOLE 5 MG PO TABS: 5.0000 mg | ORAL_TABLET | Freq: Two times a day (BID) | ORAL | 2 refills | Status: DC

## 2024-04-12 NOTE — Telephone Encounter (Signed)
 Copied from CRM [id number]. Topic: Clinical - Home Health Verbal Orders >> Apr 12, 2024  8:24 AM Winnifred Havers wrote: Caller/Agency: [facility] Callback Number: [phone] Service Requested: Skilled Nursing Frequency:1 week 3, every other week for 6 weeks, medication management, education  Any new concerns about the patient? No  Called angela back.

## 2024-04-12 NOTE — Transitions of Care (Post Inpatient/ED Visit) (Signed)
 04/12/2024  Name: Jesus Lam MRN: [id number] DOB: 1933-03-26  Today's TOC FU Call Status: Today's TOC FU Call Status:: Successful TOC FU Call Completed TOC FU Call Complete Date: 04/09/24 Patient's Name and Date of Birth confirmed.  Transition Care Management Follow-up Telephone Call Date of Discharge: 04/09/24 Discharge Facility: Other ([facility]) Name of Other ([facility]) Discharge Facility: [name] Type of Discharge: Inpatient Admission Primary Inpatient Discharge Diagnosis:: CVA How have you been since you were released from the hospital?: Better Any questions or concerns?: No  Items Reviewed: Did you receive and understand the discharge instructions provided?: Yes Medications obtained,verified, and reconciled?: Yes (Medications Reviewed) Any new allergies since your discharge?: No Dietary orders reviewed?: Yes Do you have support at home?: Yes People in Home [RPT]: child(ren), adult  Medications Reviewed Today: Medications Reviewed Today     Reviewed by Darrall Ellison, LPN (Licensed Practical Nurse) on 04/12/24 at 1519  Med List Status: <None>   Medication Order Taking? Sig Documenting Provider Last Dose Status Informant  0.9 %  sodium chloride  infusion [id number]   Darcia Easter, NP  Active   amLODipine  (NORVASC ) 5 MG tablet [id number]  TAKE ONE TABLET BY MOUTH DAILY Patwardhan, Manish J, MD  Active   apixaban  (ELIQUIS ) 5 MG TABS tablet [id number] No Take 1 tablet (5 mg total) by mouth 2 (two) times daily. Start taking after completion of starter pack.  Patient taking differently: Take 5 mg by mouth 2 (two) times daily.   Anise Kerns, RPH-CPP 03/21/2024  9:00 PM Active Child, Pharmacy Records  atorvastatin  (LIPITOR ) 40 MG tablet [id number] No Take 2 tablets (80 mg total) by mouth daily. Cody Das, MD 03/21/2024 Active Child, Pharmacy Records  azelastine  (ASTELIN ) 0.1 % nasal spray [id number] No Place 2 sprays into both nostrils 2 (two)  times daily. Use in each nostril as directed  Patient not taking: Reported on 03/22/2024   Susanna Epley, FNP Not Taking Active Child, Pharmacy Records  Cholecalciferol  (VITAMIN D3) 5000 units CAPS [id number] No Take 5,000 Units by mouth daily. [provider] 03/21/2024 Active Child, Pharmacy Records  diclofenac  Sodium (VOLTAREN ) 1 % GEL [id number] No APPLY TWO GRAMS TOPICALLY FOUR TIMES A DAY  Patient taking differently: Apply 1 Application topically daily as needed (pain).   Susanna Epley, FNP 03/21/2024 Active Child, Pharmacy Records  furosemide  (LASIX ) 20 MG tablet [id number] No Take 20 mg by mouth daily. [provider] 03/21/2024 Active Child, Pharmacy Records  GNP 8 HOUR ARTHRITIS RELIEF 650 MG CR tablet [id number]  TAKE ONE TABLET BY MOUTH TWICE A DAY Moore, Janece, FNP  Active   hydrALAZINE  (APRESOLINE ) 50 MG tablet [id number]  TAKE ONE TABLET BY MOUTH THREE TIMES A DAY Patwardhan, Manish J, MD  Active   methimazole  (TAPAZOLE ) 5 MG tablet [id number]  Take 1 tablet (5 mg total) by mouth 2 (two) times daily. Susanna Epley, FNP  Active   Multiple Vitamins-Minerals (COMPLETE SENIOR PO) [id number] No 1 tablet daily.  [provider] 03/21/2024 Active Child, Pharmacy Records  tamsulosin  (FLOMAX ) 0.4 MG CAPS capsule [id number] No TAKE ONE CAPSULE BY MOUTH DAILY HALF HOUR FOLLOWING THE SAME MEAL DAILY  Patient taking differently: Take 0.8 mg by mouth daily. TAKE ONE CAPSULE BY MOUTH DAILY HALF HOUR FOLLOWING THE SAME MEAL DAILY   Susanna Epley, FNP 03/21/2024 Active Child, Pharmacy Records  vitamin B-12 (CYANOCOBALAMIN ) 500 MCG tablet [id number] No Take 500 mcg by mouth daily. [provider] 03/21/2024 Active Child, Pharmacy Records  vitamin C  (ASCORBIC ACID ) 500  MG tablet [id number] No Take 500 mg daily by mouth. [provider] 03/21/2024 Active Child, Pharmacy Records            Home Care and Equipment/Supplies: Were Home Health Services Ordered?: Yes Name  of Home Health Agency:: [facility] Has Agency set up a time to come to your home?: No Any new equipment or medical supplies ordered?: Yes Name of Medical supply agency?: unknown Were you able to get the equipment/medical supplies?: Yes Do you have any questions related to the use of the equipment/supplies?: No  Functional Questionnaire: Do you need assistance with bathing/showering or dressing?: Yes Do you need assistance with meal preparation?: Yes Do you need assistance with eating?: No Do you have difficulty maintaining continence: No Do you need assistance with getting out of bed/getting out of a chair/moving?: No Do you have difficulty managing or taking your medications?: Yes  Follow up appointments reviewed: PCP Follow-up appointment confirmed?: Yes Date of PCP follow-up appointment?: 04/15/24 Follow-up Provider: [facility] Follow-up appointment confirmed?: Yes Date of Specialist follow-up appointment?: 04/26/24 Follow-Up Specialty Provider:: GN Do you need transportation to your follow-up appointment?: No Do you understand care options if your condition(s) worsen?: Yes-patient verbalized understanding    SIGNATURE Darrall Ellison, LPN [facility] Nurse Health Advisor Direct Dial [phone]

## 2024-04-14 NOTE — Progress Notes (Unsigned)
 [place], CMA,acting as a Neurosurgeon for Susanna Epley, FNP.,have documented all relevant documentation on the behalf of Susanna Epley, FNP,as directed by  Susanna Epley, FNP while in the presence of Susanna Epley, FNP.  Subjective:  Patient ID: Jesus Lam , male    DOB: June 14, 1933 , 89 y.o.   MRN: [id number]  Chief Complaint  Patient presents with   Hospitalization Follow-up    Patient presents today for a hospital follow up, patient reports compliance with medications. Patient denies any chest pain, SOB, or headaches. Patient was admitted 03/22/2024 and discharged on 03/26/2024. Patient was diagnosed with CVA (cerebrovascular accident due to intracerebral hemorrhage) Patient reports today he is okay, he doesn't have any concerns today.    Medication Problem    Patient reports he was told at the ER and nursing home he is not supposed to be taking the donepezil . He reports he has still been taking it and and is still getting refills.     HPI  Here today for hospital admission. He was admitted to the hospital on 4/28 and discharged on 5/2 due to difficulty speaking. MRI on 4/29 showed a small stroke, possibly related to a clot. Patient has been missing doses of Eliquis  (blood thinner). He has experienced weight loss and continues to have speech difficulties. Currently receiving home health care and therapy, including physical and occupational therapy. Speech therapy is pending.  His beta blocker was on hold due to bradycardia. He is to f/u with Cardiology to have a monitor placed. His aricept  is also on hold will see how the heart monitor results before restarting. He is to f/u with cardiology, neurology and endocrinology. Needs to have his  thyroid  levels checked in 4-6 weeks.  He is here today with his daughter  He is to have PT/OT to come out tomorrow with [facility]. He has had a nurse to visit as well on Tuesday. He still has an aid that comes in the morning to help with medication and  prepare meals. She has a camera set up in the home. She has an older brother who lives in [place] and an older sister who lives around the corner from his daughter.        Past Medical History:  Diagnosis Date   Anemia    BPH (benign prostatic hyperplasia)    Cataracts, bilateral    CVA (cerebral vascular accident) (HCC)    DVT (deep venous thrombosis) (HCC)    dvt in left leg   Dyslipidemia    Gout    Gout    HTN (hypertension)    Left hip pain 03/15/2020   Left leg pain    Osteoarthritis    PAD (peripheral artery disease) (HCC)    Stasis dermatitis    Stroke (HCC)    Vitamin D  deficiency      Family History  Problem Relation Age of Onset   Cancer Mother    Cancer Father    Alcohol abuse Father    Breast cancer Daughter    Stroke Daughter    CVA Daughter      Current Outpatient Medications:    amLODipine  (NORVASC ) 5 MG tablet, TAKE ONE TABLET BY MOUTH DAILY, Disp: 30 tablet, Rfl: 0   atorvastatin  (LIPITOR ) 40 MG tablet, Take 2 tablets (80 mg total) by mouth daily., Disp: 180 tablet, Rfl: 1   Cholecalciferol  (VITAMIN D3) 5000 units CAPS, Take 5,000 Units by mouth daily., Disp: , Rfl:    diclofenac  Sodium (VOLTAREN ) 1 % GEL,  APPLY TWO GRAMS TOPICALLY FOUR TIMES A DAY (Patient taking differently: Apply 1 Application topically daily as needed (pain).), Disp: 100 g, Rfl: 1   furosemide  (LASIX ) 20 MG tablet, Take 20 mg by mouth daily., Disp: , Rfl:    GNP 8 HOUR ARTHRITIS RELIEF 650 MG CR tablet, TAKE ONE TABLET BY MOUTH TWICE A DAY, Disp: 60 tablet, Rfl: 2   hydrALAZINE  (APRESOLINE ) 50 MG tablet, TAKE ONE TABLET BY MOUTH THREE TIMES A DAY, Disp: 90 tablet, Rfl: 0   methimazole  (TAPAZOLE ) 5 MG tablet, Take 1 tablet (5 mg total) by mouth 2 (two) times daily., Disp: 30 tablet, Rfl: 2   Multiple Vitamins-Minerals (COMPLETE SENIOR PO), 1 tablet daily. , Disp: , Rfl:    tamsulosin  (FLOMAX ) 0.4 MG CAPS capsule, TAKE ONE CAPSULE BY MOUTH DAILY HALF HOUR FOLLOWING THE SAME MEAL DAILY  (Patient taking differently: Take 0.8 mg by mouth daily. TAKE ONE CAPSULE BY MOUTH DAILY HALF HOUR FOLLOWING THE SAME MEAL DAILY), Disp: 90 capsule, Rfl: 4   vitamin B-12 (CYANOCOBALAMIN ) 500 MCG tablet, Take 500 mcg by mouth daily., Disp: , Rfl:    vitamin C  (ASCORBIC ACID ) 500 MG tablet, Take 500 mg daily by mouth., Disp: , Rfl:    apixaban  (ELIQUIS ) 5 MG TABS tablet, Take 1 tablet (5 mg total) by mouth 2 (two) times daily. Start taking after completion of starter pack. (Patient not taking: Reported on 04/15/2024), Disp: 60 tablet, Rfl: 5   azelastine  (ASTELIN ) 0.1 % nasal spray, Place 2 sprays into both nostrils 2 (two) times daily. Use in each nostril as directed (Patient not taking: Reported on 03/22/2024), Disp: 30 mL, Rfl: 5  Current Facility-Administered Medications:    0.9 %  sodium chloride  infusion, , Intravenous, PRN, Ghumman, Ramandeep, NP   Allergies  Allergen Reactions   Pork-Derived Products Other (See Comments)    Religious    Shellfish Allergy Other (See Comments)    Gout      Review of Systems   Today's Vitals   04/15/24 1129  BP: 120/70  Pulse: 74  Temp: 98.6 F (37 C)  TempSrc: Oral  Weight: 233 lb 6.4 oz (105.9 kg)  Height: 6' (1.829 m)  PainSc: 0-No pain   Body mass index is 31.65 kg/m.  Wt Readings from Last 3 Encounters:  04/15/24 233 lb 6.4 oz (105.9 kg)  11/03/23 239 lb 3.2 oz (108.5 kg)  09/22/23 237 lb (107.5 kg)      Objective:  Physical Exam Vitals and nursing note reviewed.  Constitutional:      General: He is not in acute distress.    Appearance: Normal appearance. He is obese.  Cardiovascular:     Rate and Rhythm: Normal rate and regular rhythm.     Pulses: Normal pulses.     Heart sounds: Normal heart sounds. No murmur heard. Pulmonary:     Effort: Pulmonary effort is normal. No respiratory distress.     Breath sounds: Normal breath sounds. No wheezing.  Musculoskeletal:        General: No swelling.     Comments: Using cane   Skin:    General: Skin is warm and dry.     Capillary Refill: Capillary refill takes less than 2 seconds.  Neurological:     General: No focal deficit present.     Mental Status: He is alert and oriented to person, place, and time.  Psychiatric:        Mood and Affect: Mood normal.  Behavior: Behavior normal.        Thought Content: Thought content normal.        Judgment: Judgment normal.         Assessment And Plan:  Cerebrovascular accident (CVA), unspecified mechanism (HCC) Assessment & Plan: TCM Performed. A member of the clinical team spoke with the patient upon dischare. Discharge summary was reviewed in full detail during the visit. Meds reconciled and compared to discharge meds. Medication list is updated and reviewed with the patient.  Greater than 50% face to face time was spent in counseling an coordination of care.  All questions were answered to the satisfaction of the patient.   Small 2 cm infarct likely embolic, has expressive aphasia. He is to continue with Speech therapy  Orders: -     CMP14+EGFR -     CBC  Mild cognitive impairment Assessment & Plan: His aricept  is on hold and he is to f/u with neurology   COVID-19 vaccination declined Assessment & Plan: Declines covid 19 vaccine. Discussed risk of covid 70 and if he changes her mind about the vaccine to call the office. Education has been provided regarding the importance of this vaccine but patient still declined. Advised may receive this vaccine at local pharmacy or Health Dept.or vaccine clinic. Aware to provide a copy of the vaccination record if obtained from local pharmacy or Health Dept.  Encouraged to take multivitamin, vitamin d , vitamin c  and zinc to increase immune system. Aware can call office if would like to have vaccine here at office. Verbalized acceptance and understanding.    Class 1 obesity due to excess calories without serious comorbidity with body mass index (BMI) of 31.0 to 31.9 in  adult Assessment & Plan: He is encouraged to strive for BMI less than 30 to decrease cardiac risk. Advised to aim for at least 150 minutes of exercise per week.     Expressive aphasia Assessment & Plan: He will be receiving speech therapy. Has some episodes of expressive aphasia during visit. Discussed how when fatigued this may worsen. 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  Patient was given opportunity to ask questions. Patient verbalized understanding of the plan and was able to repeat key elements of the plan. All questions were answered to their satisfaction.    Inge Mangle, FNP, have reviewed all documentation for this visit. The documentation on 04/15/24 for the exam, diagnosis, procedures, and orders are all accurate and complete.   IF YOU HAVE BEEN REFERRED TO A SPECIALIST, IT MAY TAKE 1-2 WEEKS TO SCHEDULE/PROCESS THE REFERRAL. IF YOU HAVE NOT HEARD FROM US /SPECIALIST IN TWO WEEKS, PLEASE GIVE US  A CALL AT [phone].

## 2024-04-15 ENCOUNTER — Ambulatory Visit (INDEPENDENT_AMBULATORY_CARE_PROVIDER_SITE_OTHER): Payer: Self-pay | Admitting: Nurse Practitioner

## 2024-04-15 ENCOUNTER — Encounter: Payer: Self-pay | Admitting: Nurse Practitioner

## 2024-04-15 VITALS — BP 120/70 | HR 74 | Temp 98.6°F | Ht 72.0 in | Wt 233.4 lb

## 2024-04-15 DIAGNOSIS — E66811 Obesity, class 1: Secondary | ICD-10-CM

## 2024-04-15 DIAGNOSIS — E6609 Other obesity due to excess calories: Secondary | ICD-10-CM | POA: Diagnosis not present

## 2024-04-15 DIAGNOSIS — I131 Hypertensive heart and chronic kidney 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 failure, with stage 1 through stage 4 chronic kidney disease, or unspecified chronic kidney disease: Secondary | ICD-10-CM

## 2024-04-15 DIAGNOSIS — N1831 Chronic kidney disease, stage 3a: Secondary | ICD-10-CM | POA: Diagnosis not present

## 2024-04-15 DIAGNOSIS — Z09 Encounter for follow-up examination after completed treatment for conditions other than malignant neoplasm: Secondary | ICD-10-CM

## 2024-04-15 DIAGNOSIS — Z6831 Body mass index (BMI) 31.0-31.9, adult: Secondary | ICD-10-CM

## 2024-04-15 DIAGNOSIS — Z2821 Immunization not carried out because of patient refusal: Secondary | ICD-10-CM | POA: Diagnosis not present

## 2024-04-15 DIAGNOSIS — G3184 Mild cognitive impairment, so stated: Secondary | ICD-10-CM

## 2024-04-15 DIAGNOSIS — I639 Cerebral infarction, unspecified: Secondary | ICD-10-CM

## 2024-04-15 DIAGNOSIS — R4701 Aphasia: Secondary | ICD-10-CM | POA: Diagnosis not present

## 2024-04-16 ENCOUNTER — Telehealth: Payer: Self-pay

## 2024-04-16 DIAGNOSIS — I739 Peripheral vascular disease, unspecified: Secondary | ICD-10-CM | POA: Diagnosis not present

## 2024-04-16 DIAGNOSIS — I5022 Chronic systolic (congestive) heart failure: Secondary | ICD-10-CM | POA: Diagnosis not present

## 2024-04-16 DIAGNOSIS — H9190 Unspecified hearing loss, unspecified ear: Secondary | ICD-10-CM | POA: Diagnosis not present

## 2024-04-16 DIAGNOSIS — F02A0 Dementia in other diseases classified elsewhere, mild, without behavioral disturbance, psychotic disturbance, mood disturbance, and anxiety: Secondary | ICD-10-CM | POA: Diagnosis not present

## 2024-04-16 DIAGNOSIS — N189 Chronic kidney disease, unspecified: Secondary | ICD-10-CM | POA: Diagnosis not present

## 2024-04-16 DIAGNOSIS — G3184 Mild cognitive impairment, so stated: Secondary | ICD-10-CM | POA: Diagnosis not present

## 2024-04-16 DIAGNOSIS — I130 Hypertensive heart and chronic kidney disease with heart failure and stage 1 through stage 4 chronic kidney disease, or unspecified chronic kidney disease: Secondary | ICD-10-CM | POA: Diagnosis not present

## 2024-04-16 DIAGNOSIS — I69320 Aphasia following cerebral infarction: Secondary | ICD-10-CM | POA: Diagnosis not present

## 2024-04-16 DIAGNOSIS — N390 Urinary tract infection, site not specified: Secondary | ICD-10-CM | POA: Diagnosis not present

## 2024-04-16 LAB — CBC
Hematocrit: 35.6 % — ABNORMAL LOW (ref 37.5–51.0)
Hemoglobin: 11.3 g/dL — ABNORMAL LOW (ref 13.0–17.7)
MCH: 29.8 pg (ref 26.6–33.0)
MCHC: 31.7 g/dL (ref 31.5–35.7)
MCV: 94 fL (ref 79–97)
Platelets: 215 10*3/uL (ref 150–450)
RBC: 3.79 x10E6/uL — ABNORMAL LOW (ref 4.14–5.80)
RDW: 13.1 % (ref 11.6–15.4)
WBC: 6.5 10*3/uL (ref 3.4–10.8)

## 2024-04-16 LAB — CMP14+EGFR
ALT: 15 IU/L (ref 0–44)
AST: 14 IU/L (ref 0–40)
Albumin: 3.7 g/dL (ref 3.7–4.7)
Alkaline Phosphatase: 101 IU/L (ref 44–121)
BUN/Creatinine Ratio: 11 (ref 10–24)
BUN: 12 mg/dL (ref 8–27)
Bilirubin Total: 0.5 mg/dL (ref 0.0–1.2)
CO2: 23 mmol/L (ref 20–29)
Calcium: 9.6 mg/dL (ref 8.6–10.2)
Chloride: 107 mmol/L — ABNORMAL HIGH (ref 96–106)
Creatinine, Ser: 1.1 mg/dL (ref 0.76–1.27)
Globulin, Total: 3 g/dL (ref 1.5–4.5)
Glucose: 103 mg/dL — ABNORMAL HIGH (ref 70–99)
Potassium: 4.7 mmol/L (ref 3.5–5.2)
Sodium: 146 mmol/L — ABNORMAL HIGH (ref 134–144)
Total Protein: 6.7 g/dL (ref 6.0–8.5)
eGFR: 64 mL/min/{1.73_m2} (ref >59)

## 2024-04-16 NOTE — Telephone Encounter (Signed)
 Copied from CRM [id number]. Topic: Clinical - Medical Advice >> Apr 16, 2024 10:07 AM Everette C wrote: Reason for CRM: The patient's daughter has called to request orders for additional hours with a home health aid. Please contact further if/when possible

## 2024-04-20 ENCOUNTER — Telehealth: Payer: Self-pay

## 2024-04-20 NOTE — Telephone Encounter (Signed)
 Copied from CRM [id number]. Topic: Clinical - Home Health Verbal Orders >> Apr 16, 2024  4:04 PM Hobson Luna F wrote: Caller/Agency: Gracie, [facility] Callback Number: [phone] Service Requested: Home Health Physical Therapy Frequency: 1 week 1, 2 week 4, 1 week 4 Any new concerns about the patient? No   Attempted to give the okay for these orders- LVM for call back.

## 2024-04-20 NOTE — Telephone Encounter (Signed)
 I will need a new PCS form to complete, also let her know it is up to the nurse with PCS to determine his hours.

## 2024-04-23 ENCOUNTER — Ambulatory Visit: Payer: Self-pay | Admitting: Nurse Practitioner

## 2024-04-23 DIAGNOSIS — I5022 Chronic systolic (congestive) heart failure: Secondary | ICD-10-CM | POA: Diagnosis not present

## 2024-04-23 DIAGNOSIS — H9190 Unspecified hearing loss, unspecified ear: Secondary | ICD-10-CM | POA: Diagnosis not present

## 2024-04-23 DIAGNOSIS — F02A0 Dementia in other diseases classified elsewhere, mild, without behavioral disturbance, psychotic disturbance, mood disturbance, and anxiety: Secondary | ICD-10-CM | POA: Diagnosis not present

## 2024-04-23 DIAGNOSIS — I130 Hypertensive heart and chronic kidney disease with heart failure and stage 1 through stage 4 chronic kidney disease, or unspecified chronic kidney disease: Secondary | ICD-10-CM | POA: Diagnosis not present

## 2024-04-23 DIAGNOSIS — E6609 Other obesity due to excess calories: Secondary | ICD-10-CM | POA: Insufficient documentation

## 2024-04-23 DIAGNOSIS — I739 Peripheral vascular disease, unspecified: Secondary | ICD-10-CM | POA: Diagnosis not present

## 2024-04-23 DIAGNOSIS — I69320 Aphasia following cerebral infarction: Secondary | ICD-10-CM | POA: Diagnosis not present

## 2024-04-23 DIAGNOSIS — Z2821 Immunization not carried out because of patient refusal: Secondary | ICD-10-CM | POA: Insufficient documentation

## 2024-04-23 DIAGNOSIS — R4701 Aphasia: Secondary | ICD-10-CM | POA: Insufficient documentation

## 2024-04-23 DIAGNOSIS — N189 Chronic kidney disease, unspecified: Secondary | ICD-10-CM | POA: Diagnosis not present

## 2024-04-23 DIAGNOSIS — N390 Urinary tract infection, site not specified: Secondary | ICD-10-CM | POA: Diagnosis not present

## 2024-04-23 DIAGNOSIS — G3184 Mild cognitive impairment, so stated: Secondary | ICD-10-CM | POA: Diagnosis not present

## 2024-04-23 NOTE — Assessment & Plan Note (Signed)
 His aricept  is on hold and he is to f/u with neurology

## 2024-04-23 NOTE — Assessment & Plan Note (Signed)
 He is encouraged to strive for BMI less than 30 to decrease cardiac risk. Advised to aim for at least 150 minutes of exercise per week.

## 2024-04-23 NOTE — Assessment & Plan Note (Signed)
 Blood pressure is well controlled, continue current medications.

## 2024-04-23 NOTE — Assessment & Plan Note (Signed)
 TCM Performed. A member of the clinical team spoke with the patient upon dischare. Discharge summary was reviewed in full detail during the visit. Meds reconciled and compared to discharge meds. Medication list is updated and reviewed with the patient.  Greater than 50% face to face time was spent in counseling an coordination of care.  All questions were answered to the satisfaction of the patient.   Small 2 cm infarct likely embolic, has expressive aphasia. He is to continue with Speech therapy

## 2024-04-23 NOTE — Assessment & Plan Note (Signed)
>>  ASSESSMENT AND PLAN FOR CLASS 1 OBESITY DUE TO EXCESS CALORIES WITHOUT SERIOUS COMORBIDITY WITH BODY MASS INDEX (BMI) OF 31.0 TO 31.9 IN ADULT WRITTEN ON 04/23/2024  7:28 AM BY GEORGINA SPEAKS, FNP  He is encouraged to strive for BMI less than 30 to decrease cardiac risk. Advised to aim for at least 150 minutes of exercise per week.

## 2024-04-23 NOTE — Assessment & Plan Note (Signed)
 He will be receiving speech therapy. Has some episodes of expressive aphasia during visit. Discussed how when fatigued this may worsen. He is to f/u with Neurology

## 2024-04-23 NOTE — Assessment & Plan Note (Signed)

## 2024-04-26 ENCOUNTER — Ambulatory Visit (INDEPENDENT_AMBULATORY_CARE_PROVIDER_SITE_OTHER): Admitting: Adult Health

## 2024-04-26 ENCOUNTER — Telehealth: Payer: Self-pay

## 2024-04-26 ENCOUNTER — Encounter: Payer: Self-pay | Admitting: Adult Health

## 2024-04-26 VITALS — BP 119/73 | HR 73 | Ht 72.0 in | Wt 237.0 lb

## 2024-04-26 DIAGNOSIS — I69320 Aphasia following cerebral infarction: Secondary | ICD-10-CM

## 2024-04-26 DIAGNOSIS — I63412 Cerebral infarction due to embolism of left middle cerebral artery: Secondary | ICD-10-CM

## 2024-04-26 DIAGNOSIS — Z09 Encounter for follow-up examination after completed treatment for conditions other than malignant neoplasm: Secondary | ICD-10-CM | POA: Diagnosis not present

## 2024-04-26 DIAGNOSIS — G8191 Hemiplegia, unspecified affecting right dominant side: Secondary | ICD-10-CM

## 2024-04-26 NOTE — Telephone Encounter (Signed)
 Copied from CRM [id number]. Topic: Clinical - Medical Advice >> Apr 26, 2024  9:50 AM Kevelyn M wrote: Reason for CRM: Gracie Pt with [facility] following up on verbal order for PT that was sent in on 04/16/2024. Please call her back #[phone]  Left VM for gracie to return call.

## 2024-04-26 NOTE — Patient Instructions (Addendum)
 Start home health therapies - if you need assistance with outpatient speech therapy, please let me know  Would recommend evaluation with ophthalmology for vision exam due to some difficulty seeing in your lower right quadrant of your vision  Continue Eliquis   and atorvastatin  for secondary stroke prevention managed/prescribed by your PCP  Continue to follow up with PCP regarding blood pressure and cholesterol management  Maintain strict control of hypertension with blood pressure goal below 130/90 and cholesterol with LDL cholesterol (bad cholesterol) goal below 70 mg/dL.   Signs of a Stroke? Follow the BEFAST method:  Balance Watch for a sudden loss of balance, trouble with coordination or vertigo Eyes Is there a sudden loss of vision in one or both eyes? Or double vision?  Face: Ask the person to smile. Does one side of the face droop or is it numb?  Arms: Ask the person to raise both arms. Does one arm drift downward? Is there weakness or numbness of a leg? Speech: Ask the person to repeat a simple phrase. Does the speech sound slurred/strange? Is the person confused ? Time: If you observe any of these signs, call 911.      Followup in the future with me in 6-7 months with Dr. Janett Medin or call earlier if needed       Thank you for coming to see us  at [facility]. I hope we have been able to provide you high quality care today.  You may receive a patient satisfaction survey over the next few weeks. We would appreciate your feedback and comments so that we may continue to improve ourselves and the health of our patients.

## 2024-04-26 NOTE — Progress Notes (Signed)
 [facility] [address] [place]. [place] [address] [phone]       HOSPITAL FOLLOW UP NOTE  [name] Date of Birth:  07/02/1933 Medical Lam Number:  [id number]   Reason for Referral:  hospital stroke follow up    SUBJECTIVE:   CHIEF COMPLAINT:  Chief Complaint  Patient presents with   Cerebrovascular Accident    Rm 3 with daughter Jesus Lam  Pt is well, reports difficulty with speech. Daughter states he was suppose to have therapy but they have not been contacted for him to start. No other concerns.     HPI:   [name] is a 89 y.o. male with history of DVT on Eliquis , hypertension, hyperlipidemia, PAD, dementia, and hx of stroke 2019 who presented to ED on 03/22/2024 with aphasia.  Stroke workup revealed left MCA moderate infarct as well as small right cerebellar infarct, embolic pattern, likely due to missing dose of Eliquis . CTA head/neck unremarkable. EF 45-50%. LDL 49, A1c 4.8.  Recommended continuation of Eliquis  5 mg twice daily and atorvastatin  80 mg daily, family plans on monitoring/assisting with medications.  History of cardiomyopathy with improvement of EF noted on inpatient echo and sinus bradycardia with sinus pauses, medication adjustments made and advised outpatient cardiology follow-up with plans on pursuing cardiac monitor.  He was discharged to SNF for ongoing therapy needs.    Today, 04/26/2024, patient is being seen for hospital follow-up accompanied by his daughter.  Reports continued speech difficulty, right-sided weakness and gait impairment.  Returned back home from SNF about 2 weeks ago, was just seen by home health nurse last week and is trying to get therapies set up.  Lives in his own home but has supportive daughter who assist with ADLs and has aide assistance during the weekdays.  Ambulates with rolling walker outside of home and cane indoors, no recent falls. No new stroke/TIA symptoms. Remains on Eliquis  and  atorvastatin , no side effects. Routinely follows with PCP for stroke risk factor management. Is scheduled with cardiology this week and endocrinology at end of the month. No further questions or concerns at this time.     PERTINENT IMAGING  CT no acute abnormality, old left temporoparietal infarcts CT head and neck unremarkable CTP 6/21cc MRI acute posterior left MCA distribution infarct involving the left parietal lobe. Small right cerebellar infarct.  2D Echo EF 45-50% LDL 49 HgbA1c 4.8    ROS:   14 system review of systems performed and negative with exception of those listed in HPI  PMH:  Past Medical History:  Diagnosis Date   Anemia    BPH (benign prostatic hyperplasia)    Cataracts, bilateral    CVA (cerebral vascular accident) (HCC)    DVT (deep venous thrombosis) (HCC)    dvt in left leg   Dyslipidemia    Gout    Gout    HTN (hypertension)    Left hip pain 03/15/2020   Left leg pain    Osteoarthritis    PAD (peripheral artery disease) (HCC)    Stasis dermatitis    Stroke (HCC)    Vitamin D  deficiency     PSH:  Past Surgical History:  Procedure Laterality Date   ABDOMINAL AORTOGRAM N/A 01/13/2018   Procedure: ABDOMINAL AORTOGRAM;  Surgeon: Cody Das, MD;  Location: [facility];  Service: Cardiovascular;  Laterality: N/A;   CATARACT EXTRACTION, BILATERAL     LEFT HEART CATH AND CORONARY ANGIOGRAPHY N/A 01/13/2018   Procedure: LEFT HEART  CATH AND CORONARY ANGIOGRAPHY;  Surgeon: Cody Das, MD;  Location: [facility];  Service: Cardiovascular;  Laterality: N/A;   LOWER EXTREMITY ANGIOGRAPHY N/A 01/13/2018   Procedure: LOWER EXTREMITY ANGIOGRAPHY;  Surgeon: Cody Das, MD;  Location: [facility];  Service: Cardiovascular;  Laterality: N/A;   LOWER EXTREMITY ANGIOGRAPHY Right 01/27/2018   Procedure: LOWER EXTREMITY ANGIOGRAPHY;  Surgeon: Knox Perl, MD;  Location: [facility];  Service: Cardiovascular;   Laterality: Right;   PERIPHERAL VASCULAR ATHERECTOMY Left 01/13/2018   Procedure: PERIPHERAL VASCULAR ATHERECTOMY;  Surgeon: Cody Das, MD;  Location: [facility];  Service: Cardiovascular;  Laterality: Left;  SFA WITH PTA DRUG COATED BALLOON   PERIPHERAL VASCULAR INTERVENTION  01/27/2018   Procedure: PERIPHERAL VASCULAR INTERVENTION;  Surgeon: Knox Perl, MD;  Location: [facility];  Service: Cardiovascular;;    Social History:  Social History   Socioeconomic History   Marital status: Single    Spouse name: Not on file   Number of children: 3   Years of education: Not on file   Highest education level: Not on file  Occupational History   Occupation: retired  Tobacco Use   Smoking status: Former    Current packs/day: 0.00    Average packs/day: 0.3 packs/day for 0.5 years (0.1 ttl pk-yrs)    Types: Cigarettes    Start date: 05/1978    Quit date: 1980    Years since quitting: 45.4   Smokeless tobacco: Never  Vaping Use   Vaping status: Never Used  Substance and Sexual Activity   Alcohol use: Yes    Alcohol/week: 6.0 - 7.0 standard drinks of alcohol    Types: 6 - 7 Shots of liquor per week    Comment: on occasion   Drug use: Yes    Types: Marijuana    Comment: last used x 2 hours ago   Sexual activity: Yes  Other Topics Concern   Not on file  Social History Narrative   ** Merged History Encounter **       Lives with daughter, Jesus Lam. 2 daughters live here in [place]. Son lives in [place]. 3/16- offered information on local food pantries as well as congregate meal sites. Information declined.   Social Drivers of Corporate investment banker Strain: Low Risk  (06/04/2023)   Overall Financial Resource Strain (CARDIA)    Difficulty of Paying Living Expenses: Not hard at all  Food Insecurity: No Food Insecurity (03/22/2024)   Hunger Vital Sign    Worried About Running Out of Food in the Last Year: Never true    Ran Out of Food in the Last Year:  Never true  Transportation Needs: No Transportation Needs (03/22/2024)   PRAPARE - Administrator, Civil Service (Medical): No    Lack of Transportation (Non-Medical): No  Physical Activity: Sufficiently Active (06/04/2023)   Exercise Vital Sign    Days of Exercise per Week: 3 days    Minutes of Exercise per Session: 60 min  Recent Concern: Physical Activity - Insufficiently Active (06/04/2023)   Exercise Vital Sign    Days of Exercise per Week: 3 days    Minutes of Exercise per Session: 20 min  Stress: No Stress Concern Present (06/04/2023)   [employer] of Occupational Health - Occupational Stress Questionnaire    Feeling of Stress : Not at all  Social Connections: Moderately Integrated (03/22/2024)   Social Connection and Isolation Panel [NHANES]  Frequency of Communication with Friends and Family: More than three times a week    Frequency of Social Gatherings with Friends and Family: Three times a week    Attends Religious Services: 1 to 4 times per year    Active Member of Clubs or Organizations: Yes    Attends Banker Meetings: More than 4 times per year    Marital Status: Widowed  Intimate Partner Violence: Not At Risk (03/22/2024)   Humiliation, Afraid, Rape, and Kick questionnaire    Fear of Current or Ex-Partner: No    Emotionally Abused: No    Physically Abused: No    Sexually Abused: No    Family History:  Family History  Problem Relation Age of Onset   Cancer Mother    Cancer Father    Alcohol abuse Father    Breast cancer Daughter    Stroke Daughter    CVA Daughter     Medications:   Current Outpatient Medications on File Prior to Visit  Medication Sig Dispense Refill   amLODipine  (NORVASC ) 5 MG tablet TAKE ONE TABLET BY MOUTH DAILY 30 tablet 0   apixaban  (ELIQUIS ) 5 MG TABS tablet Take 1 tablet (5 mg total) by mouth 2 (two) times daily. Start taking after completion of starter pack. 60 tablet 5   atorvastatin  (LIPITOR ) 40 MG  tablet Take 2 tablets (80 mg total) by mouth daily. 180 tablet 1   azelastine  (ASTELIN ) 0.1 % nasal spray Place 2 sprays into both nostrils 2 (two) times daily. Use in each nostril as directed 30 mL 5   Cholecalciferol  (VITAMIN D3) 5000 units CAPS Take 5,000 Units by mouth daily.     diclofenac  Sodium (VOLTAREN ) 1 % GEL APPLY TWO GRAMS TOPICALLY FOUR TIMES A DAY (Patient taking differently: Apply 1 Application topically daily as needed (pain).) 100 g 1   furosemide  (LASIX ) 20 MG tablet Take 20 mg by mouth daily.     GNP 8 HOUR ARTHRITIS RELIEF 650 MG CR tablet TAKE ONE TABLET BY MOUTH TWICE A DAY 60 tablet 2   hydrALAZINE  (APRESOLINE ) 50 MG tablet TAKE ONE TABLET BY MOUTH THREE TIMES A DAY 90 tablet 0   methimazole  (TAPAZOLE ) 5 MG tablet Take 1 tablet (5 mg total) by mouth 2 (two) times daily. 30 tablet 2   Multiple Vitamins-Minerals (COMPLETE SENIOR PO) 1 tablet daily.      tamsulosin  (FLOMAX ) 0.4 MG CAPS capsule TAKE ONE CAPSULE BY MOUTH DAILY HALF HOUR FOLLOWING THE SAME MEAL DAILY (Patient taking differently: Take 0.8 mg by mouth daily. TAKE ONE CAPSULE BY MOUTH DAILY HALF HOUR FOLLOWING THE SAME MEAL DAILY) 90 capsule 4   vitamin B-12 (CYANOCOBALAMIN ) 500 MCG tablet Take 500 mcg by mouth daily.     vitamin C  (ASCORBIC ACID ) 500 MG tablet Take 500 mg daily by mouth.     Current Facility-Administered Medications on File Prior to Visit  Medication Dose Route Frequency Provider Last Rate Last Admin   0.9 %  sodium chloride  infusion   Intravenous PRN Ghumman, Ramandeep, NP        Allergies:   Allergies  Allergen Reactions   Pork-Derived Products Other (See Comments)    Religious    Shellfish Allergy Other (See Comments)    Gout       OBJECTIVE:  Physical Exam  Vitals:   04/26/24 0919  BP: 119/73  Pulse: 73  Weight: 237 lb (107.5 kg)  Height: 6' (1.829 m)   Body mass index is 32.14 kg/m.  No results found.   General: well developed, well nourished, very pleasant elderly  African-American male, seated, in no evident distress  Neurologic Exam Mental Status: Awake and fully alert.  Moderate expressive aphasia, mild dysarthria. Able to follow commands without difficulty. Oriented to place and time. Attention span, concentration and fund of knowledge appears appropriate although daughter provided majority of history due to aphasia. Mood and affect appropriate.  Cranial Nerves: Pupils equal, briskly reactive to light. Extraocular movements full without nystagmus. Visual fields decreased vision right lower peripheral field bilaterally. Hearing intact. Facial sensation intact.  Mild right lower facial weakness.  Tongue, palate moves normally and symmetrically.  Motor: Normal strength, bulk and tone left upper and lower extremity.  RUE 4/5 with decreased ROM, RLE 4/5 greater HP and ADF Sensory.: intact to touch , pinprick , position and vibratory sensation.  Coordination: Rapid alternating movements slightly decreased right side. Finger-to-nose and heel-to-shin mild right sided incoordination. Gait and Station: Arises from chair without difficulty. Stance is normal. Gait demonstrates normal stride length with use of RW. Tandem walk and heel toe not attempted.  Reflexes: 1+ and symmetric. Toes downgoing.      NIHSS  6 Modified Rankin  3      ASSESSMENT: Jesus Lam is a 89 y.o. year old male with a left MCA matter infarct and small right cerebellar infarct 03/22/2024 embolic pattern likely due to missing doses of Eliquis . Vascular risk factors include hx of DVT on Eliquis , HTN, HLD, cardiomyopathy, PAD, history of stroke 2019, advanced age and dementia.      PLAN:  Embolic strokes:  Residual deficit: Expressive aphasia, mild right hemiparesis and partial hemianopia. Start HH therapies hopefully this week. Advised daughter to call if they are interested in pursuing outpatient speech therapy as this can be limited in [facility] setting.  Encouraged follow-up with  ophthalmology for further visual examination Follow-up with cardiology this week with plans on pursuing cardiac monitor Continue Eliquis  5mg  twice daily and atorvastatin  (Lipitor ) for secondary stroke prevention managed/prescribed by PCP/cardiology.   Discussed secondary stroke prevention measures and importance of close PCP follow up for aggressive stroke risk factor management including BP goal<130/90, HLD with LDL goal<70 and DM with A1c.<7 .  Stroke labs 02/2024: LDL 49, A1c 4.8 I have gone over the pathophysiology of stroke, warning signs and symptoms, risk factors and their management in some detail with instructions to go to the closest emergency room for symptoms of concern.     Follow up in 6-7 months or call earlier if needed   CC:  [facility] provider: Dr. Janett Medin PCP: Susanna Epley, FNP    I personally spent a total of 50 minutes in the care of the patient today including preparing to see the patient, getting/reviewing separately obtained history, performing a medically appropriate exam/evaluation, counseling and educating, documenting clinical information in the EHR, independently interpreting results, and extensive review of hospitalization.  Johny Nap, AGNP-BC  [facility] [address] [place], [place] [address]  Phone [phone] Fax [phone] Note: This document was prepared with digital dictation and possible smart phrase technology. Any transcriptional errors that result from this process are unintentional.

## 2024-04-27 DIAGNOSIS — N390 Urinary tract infection, site not specified: Secondary | ICD-10-CM | POA: Diagnosis not present

## 2024-04-27 DIAGNOSIS — I5022 Chronic systolic (congestive) heart failure: Secondary | ICD-10-CM | POA: Diagnosis not present

## 2024-04-27 DIAGNOSIS — N189 Chronic kidney disease, unspecified: Secondary | ICD-10-CM | POA: Diagnosis not present

## 2024-04-27 DIAGNOSIS — I130 Hypertensive heart and chronic kidney disease with heart failure and stage 1 through stage 4 chronic kidney disease, or unspecified chronic kidney disease: Secondary | ICD-10-CM | POA: Diagnosis not present

## 2024-04-27 DIAGNOSIS — G3184 Mild cognitive impairment, so stated: Secondary | ICD-10-CM | POA: Diagnosis not present

## 2024-04-27 DIAGNOSIS — F02A0 Dementia in other diseases classified elsewhere, mild, without behavioral disturbance, psychotic disturbance, mood disturbance, and anxiety: Secondary | ICD-10-CM | POA: Diagnosis not present

## 2024-04-27 DIAGNOSIS — I69320 Aphasia following cerebral infarction: Secondary | ICD-10-CM | POA: Diagnosis not present

## 2024-04-27 DIAGNOSIS — I739 Peripheral vascular disease, unspecified: Secondary | ICD-10-CM | POA: Diagnosis not present

## 2024-04-27 DIAGNOSIS — H9190 Unspecified hearing loss, unspecified ear: Secondary | ICD-10-CM | POA: Diagnosis not present

## 2024-04-28 DIAGNOSIS — H9190 Unspecified hearing loss, unspecified ear: Secondary | ICD-10-CM | POA: Diagnosis not present

## 2024-04-28 DIAGNOSIS — I5022 Chronic systolic (congestive) heart failure: Secondary | ICD-10-CM | POA: Diagnosis not present

## 2024-04-28 DIAGNOSIS — N390 Urinary tract infection, site not specified: Secondary | ICD-10-CM | POA: Diagnosis not present

## 2024-04-28 DIAGNOSIS — I739 Peripheral vascular disease, unspecified: Secondary | ICD-10-CM | POA: Diagnosis not present

## 2024-04-28 DIAGNOSIS — N189 Chronic kidney disease, unspecified: Secondary | ICD-10-CM | POA: Diagnosis not present

## 2024-04-28 DIAGNOSIS — I130 Hypertensive heart and chronic kidney disease with heart failure and stage 1 through stage 4 chronic kidney disease, or unspecified chronic kidney disease: Secondary | ICD-10-CM | POA: Diagnosis not present

## 2024-04-28 DIAGNOSIS — F02A0 Dementia in other diseases classified elsewhere, mild, without behavioral disturbance, psychotic disturbance, mood disturbance, and anxiety: Secondary | ICD-10-CM | POA: Diagnosis not present

## 2024-04-28 DIAGNOSIS — I69320 Aphasia following cerebral infarction: Secondary | ICD-10-CM | POA: Diagnosis not present

## 2024-04-28 DIAGNOSIS — G3184 Mild cognitive impairment, so stated: Secondary | ICD-10-CM | POA: Diagnosis not present

## 2024-04-28 NOTE — Progress Notes (Signed)
 Cardiology Office Note    Patient Name: Jesus Lam Date of Encounter: 04/28/2024  Primary Care Provider:  Susanna Epley, FNP Primary Cardiologist:  Cody Das, MD Primary Electrophysiologist: None   Past Medical History    Past Medical History:  Diagnosis Date   Anemia    BPH (benign prostatic hyperplasia)    Cataracts, bilateral    CVA (cerebral vascular accident) [facility])    DVT (deep venous thrombosis) (HCC)    dvt in left leg   Dyslipidemia    Gout    Gout    HTN (hypertension)    Left hip pain 03/15/2020   Left leg pain    Osteoarthritis    PAD (peripheral artery disease) (HCC)    Stasis dermatitis    Stroke (HCC)    Vitamin D  deficiency     History of Present Illness  Jesus Lam is a 89 y.o. male with a PMH of HTN, HLD, PAD s/p right SFA angioplasty with overlapping PTX stents and arthrectomy 12/2017, NICM, HFrEF, CVA s/p left temporal occipital cortical infarct 05/2018 and left distal MCA infarction 02/2024, DVT, aortic dilation (41 mm) who presents today for posthospital follow-up.  [name] has been followed by Dr. Audery Blazing since 2019 for management of PAD and hypertension.  He underwent an LHC in 12/2017 that showed nonobstructive CAD and lower extremity angioplasty completed due to class III claudication withs/p DCB to RT SFA and overlapping stents to the L SFA at that time. Echo 05/2018 with LVEF of 25-30%, g1DD, mild AR, moderately dilated LA. Last seen in the office on 08/2023 and reported mild calf tenderness. Sent for outpatient dopplers which where positive for left DVT.  He was admitted most recently on 03/23/2024 with expressive and receptive aphasia.  He had a prior CVA suffered in 2019 with chronic stuttering.  Code stroke was initiated and EKG showed sinus bradycardia with first-degree AVB and CT of the head and neck showed no emergent large vessel occlusion but did reveal completed infarct in the superior left parotid cortex with acute left  distal MCA branch infarction.  He was deemed not a candidate for TNK or thrombectomy and permissive hypertension was allowed.  He was noted to have heart rate in the 30s with 2 to 3-second sinus pauses and was evaluated by cardiology.  He was asymptomatic and was advised to continue to hold AV nodal blocking agents.  2D echo was completed showing EF of 45-50% with mildly decreased LV function and global hypokinesis with moderate LVH and grade 1 DD with moderately dilated LA and mild calcification of AVR with mild aortic stenosis and mean gradient of 12 mmHg with mild dilation of ascending aorta (41 mm).  He had bisoprolol  and Aricept  held with improvement to heart rate in the 50s and 60s.  He was also found to have suppressed TSH with elevated free T4 with recommendation to follow-up with endocrinology.  He was discharged to SNF and had event monitor ordered prior to discharge.  Patient denies chest pain, palpitations, dyspnea, PND, orthopnea, nausea, vomiting, dizziness, syncope, edema, weight gain, or early satiety.   Discussed the use of AI scribe software for clinical note transcription with the patient, who gave verbal consent to proceed.  History of Present Illness    ***Notes: Patient needs outpatient monitor placement.   Review of Systems  Please see the history of present illness.    All other systems reviewed and are otherwise negative except as noted above.  Physical Exam  Wt Readings from Last 3 Encounters:  04/26/24 237 lb (107.5 kg)  04/15/24 233 lb 6.4 oz (105.9 kg)  11/03/23 239 lb 3.2 oz (108.5 kg)   WU:[id number] were no vitals filed for this visit.,There is no height or weight on file to calculate BMI. GEN: Well nourished, well developed in no acute distress Neck: No JVD; No carotid bruits Pulmonary: Clear to auscultation without rales, wheezing or rhonchi  Cardiovascular: Normal rate. Regular rhythm. Normal S1. Normal S2.   Murmurs: There is no murmur.  ABDOMEN:  Soft, non-tender, non-distended EXTREMITIES:  No edema; No deformity   EKG/LABS/ Recent Cardiac Studies   ECG personally reviewed by me today - ***  Risk Assessment/Calculations:   {Does this patient have ATRIAL FIBRILLATION?:[phone]}      Lab Results  Component Value Date   WBC 6.5 04/15/2024   HGB 11.3 (L) 04/15/2024   HCT 35.6 (L) 04/15/2024   MCV 94 04/15/2024   PLT 215 04/15/2024   Lab Results  Component Value Date   CREATININE 1.10 04/15/2024   BUN 12 04/15/2024   NA 146 (H) 04/15/2024   K 4.7 04/15/2024   CL 107 (H) 04/15/2024   CO2 23 04/15/2024   Lab Results  Component Value Date   CHOL 94 03/23/2024   HDL 23 (L) 03/23/2024   LDLCALC 49 03/23/2024   TRIG 112 03/23/2024   CHOLHDL 4.1 03/23/2024    Lab Results  Component Value Date   HGBA1C 4.8 03/23/2024   Assessment & Plan    Assessment and Plan Assessment & Plan     1.  HFrEF/ICM  2.  History of CVA  3.  Hypertension  4.  History of PAD  5.  Sinus bradycardia/pauses  6.  Hypothyroidism      Disposition: Follow-up with Cody Das, MD or APP in *** months {Are you ordering a CV Procedure (e.g. stress test, cath, DCCV, TEE, etc)?   Press F2        :[id number]}   Signed, Francene Ing, Retha Cast, NP 04/28/2024, 12:40 PM [facility]

## 2024-04-29 ENCOUNTER — Ambulatory Visit: Attending: Nurse Practitioner | Admitting: Nurse Practitioner

## 2024-04-29 ENCOUNTER — Telehealth: Payer: Self-pay

## 2024-04-29 ENCOUNTER — Encounter: Payer: Self-pay | Admitting: Nurse Practitioner

## 2024-04-29 ENCOUNTER — Other Ambulatory Visit

## 2024-04-29 ENCOUNTER — Other Ambulatory Visit (HOSPITAL_COMMUNITY): Payer: Self-pay

## 2024-04-29 ENCOUNTER — Telehealth: Payer: Self-pay | Admitting: Cardiology

## 2024-04-29 VITALS — BP 102/60 | HR 75 | Ht 72.0 in | Wt 238.0 lb

## 2024-04-29 DIAGNOSIS — I455 Other specified heart block: Secondary | ICD-10-CM

## 2024-04-29 DIAGNOSIS — I739 Peripheral vascular disease, unspecified: Secondary | ICD-10-CM | POA: Diagnosis not present

## 2024-04-29 DIAGNOSIS — I10 Essential (primary) hypertension: Secondary | ICD-10-CM

## 2024-04-29 DIAGNOSIS — G309 Alzheimer's disease, unspecified: Secondary | ICD-10-CM

## 2024-04-29 DIAGNOSIS — I5020 Unspecified systolic (congestive) heart failure: Secondary | ICD-10-CM

## 2024-04-29 DIAGNOSIS — I639 Cerebral infarction, unspecified: Secondary | ICD-10-CM | POA: Diagnosis not present

## 2024-04-29 DIAGNOSIS — R001 Bradycardia, unspecified: Secondary | ICD-10-CM

## 2024-04-29 DIAGNOSIS — F0280 Dementia in other diseases classified elsewhere without behavioral disturbance: Secondary | ICD-10-CM

## 2024-04-29 MED ORDER — FUROSEMIDE 20 MG PO TABS
20.0000 mg | ORAL_TABLET | ORAL | 3 refills | Status: DC | PRN
Start: 2024-04-29 — End: 2024-06-21
  Filled 2024-04-29: qty 30, 30d supply, fill #0

## 2024-04-29 NOTE — Progress Notes (Unsigned)
 Applied a 14 day Zio AT monitor to patient in the office  [place]han to read

## 2024-04-29 NOTE — Telephone Encounter (Signed)
 Copied from CRM [id number]. Topic: General - Other >> Apr 28, 2024  4:29 PM Loreda Rodriguez T wrote: Reason for CRM: Neoma Banker [phone] with [facility] following up on verbal order for PT that was sent in on 04/16/2024. Please f/u with Gracie  LVM for gracie to call back.

## 2024-04-29 NOTE — Telephone Encounter (Signed)
 Jocelyn with [employer] is calling to report abnormal EKG results.  Phone#: [phone] Reference#: [id number]

## 2024-04-29 NOTE — Telephone Encounter (Signed)
 Copied from CRM [id number]. Topic: Clinical - Home Health Verbal Orders >> Apr 29, 2024 12:45 PM Rosaria Common wrote: Caller/Agency: [facility] Callback Number: [phone] Service Requested: Physical Therapy Frequency: 1w1, 2w4, 1w4 Any new concerns about the patient? No  Gracie was giving the okay for this order.

## 2024-04-29 NOTE — Patient Instructions (Signed)
 Medication Instructions:  Take lasix  once a day as needed for SOB or Lower extremity edema, with your regular dose of lasix  *If you need a refill on your cardiac medications before your next appointment, please call your pharmacy*  Lab Work: No labs  Testing/Procedures: ZIO AT Long term monitor-Live Telemetry  Your physician has requested you wear a ZIO patch monitor for 14 days.  This is a single patch monitor. [employer] supplies one patch monitor per enrollment. Additional  stickers are not available.  Please do not apply patch if you will be having a Nuclear Stress Test, Echocardiogram, Cardiac CT, MRI,  or Chest Xray during the period you would be wearing the monitor. The patch cannot be worn during  these tests. You cannot remove and re-apply the ZIO AT patch monitor.  Your ZIO patch monitor will be mailed 3 day USPS to your address on file. It may take 3-5 days to  receive your monitor after you have been enrolled.  Once you have received your monitor, please review the enclosed instructions. Your monitor has  already been registered assigning a specific monitor serial # to you.   Billing and Patient Assistance Program information  Jesus Lam has been supplied with any insurance information on record for billing. [employer] offers a sliding scale Patient Assistance Program for patients without insurance, or whose  insurance does not completely cover the cost of the ZIO patch monitor. You must apply for the  Patient Assistance Program to qualify for the discounted rate. To apply, call [employer] at [phone],  select option 4, select option 2 , ask to apply for the Patient Assistance Program, (you can request an  interpreter if needed). [employer] will ask your household income and how many people are in your  household. [employer] will quote your out-of-pocket cost based on this information. They will also be able  to set up a 12 month interest free payment plan if needed.  Applying the  monitor   Shave hair from upper left chest.  Hold the abrader disc by orange tab. Rub the abrader in 40 strokes over left upper chest as indicated in  your monitor instructions.  Clean area with 4 enclosed alcohol pads. Use all pads to ensure the area is cleaned thoroughly. Let  dry.  Apply patch as indicated in monitor instructions. Patch will be placed under collarbone on left side of  chest with arrow pointing upward.  Rub patch adhesive wings for 2 minutes. Remove the white label marked "1". Remove the white label  marked "2". Rub patch adhesive wings for 2 additional minutes.  While looking in a mirror, press and release button in center of patch. A small green light will flash 3-4  times. This will be your only indicator that the monitor has been turned on.  Do not shower for the first 24 hours. You may shower after the first 24 hours.  Press the button if you feel a symptom. You will hear a small click. Record Date, Time and Symptom in  the Patient Log.   Starting the Gateway  In your kit there is a Audiological scientist box the size of a cellphone. This is Buyer, retail. It transmits all your  recorded data to [facility]. This box must always stay within 10 feet of you. Open the box and push the *  button. There will be a light that blinks orange and then green a few times. When the light stops  blinking, the Gateway is connected to the ZIO  patch. Call [employer] at [phone] to confirm your monitor is transmitting.  Returning your monitor  Remove your patch and place it inside the Gateway. In the lower half of the Gateway there is a white  bag with prepaid postage on it. Place Gateway in bag and seal. Mail package back to [place] as soon as  possible. Your physician should have your final report approximately 7 days after you have mailed back  your monitor. Call [facility] Customer Care at [phone] if you have questions regarding your ZIO AT  patch monitor. Call them  immediately if you see an orange light blinking on your monitor.  If your monitor falls off in less than 4 days, contact our Monitor department at [phone]. If your  monitor becomes loose or falls off after 4 days call [employer] at [phone] for suggestions on  securing your monitor  Follow-Up: At [facility], you and your health needs are our priority.  As part of our continuing mission to provide you with exceptional heart care, our providers are all part of one team.  This team includes your primary Cardiologist (physician) and Advanced Practice Providers or APPs (Physician Assistants and Nurse Practitioners) who all work together to provide you with the care you need, when you need it.  Your next appointment:   3 month(s)  Provider:   Cody Das, MD or Charles Connor, NP  We recommend signing up for the patient portal called "MyChart".  Sign up information is provided on this After Visit Summary.  MyChart is used to connect with patients for Virtual Visits (Telemedicine).  Patients are able to view lab/test results, encounter notes, upcoming appointments, etc.  Non-urgent messages can be sent to your provider as well.   To learn more about what you can do with MyChart, go to [email or web address].

## 2024-04-29 NOTE — Telephone Encounter (Signed)
     Cardiac Monitor Alert  Date of alert:  04/29/2024   Patient Name: Jesus Lam  DOB: [birth year]  MRN: [id number]   [facility] Cardiologist: Cody Das, MD  [facility] EP:  None    Monitor Information: Long Term Monitor-Live Telemetry [ZioAT]  Reason:  Sinus pause, sinus bradycardia, CVA Ordering provider:  Charles Connor, NP { Alert Atrial Fibrillation/Flutter This is the 1st alert for this rhythm.  The patient has no hx of Atrial Fibrillation/Flutter.  The patient is currently on anticoagulation. Anticoagulation medication as of 04/29/2024           apixaban  (ELIQUIS ) 5 MG TABS tablet Take 1 tablet (5 mg total) by mouth 2 (two) times daily. Start taking after completion of starter pack.       Next Cardiology Appointment   Date:  07/30/2024  Provider:  Charles Connor, NP  The patient was contacted today.  He is asymptomatic. Arrhythmia, symptoms and history reviewed with Charles Connor, NP.  Plan:  Patient to continue with monitoring and report    Other: REPORT ACKNOWLEDGED BY ERNEST DICK, NP AND TO BE SCANNED INTO PATIENT CHART.  Zeb Heys, RN  04/29/2024 3:51 PM

## 2024-04-30 DIAGNOSIS — N189 Chronic kidney disease, unspecified: Secondary | ICD-10-CM | POA: Diagnosis not present

## 2024-04-30 DIAGNOSIS — R001 Bradycardia, unspecified: Secondary | ICD-10-CM | POA: Diagnosis not present

## 2024-04-30 DIAGNOSIS — N390 Urinary tract infection, site not specified: Secondary | ICD-10-CM | POA: Diagnosis not present

## 2024-04-30 DIAGNOSIS — I455 Other specified heart block: Secondary | ICD-10-CM | POA: Diagnosis not present

## 2024-04-30 DIAGNOSIS — I739 Peripheral vascular disease, unspecified: Secondary | ICD-10-CM | POA: Diagnosis not present

## 2024-04-30 DIAGNOSIS — F02A0 Dementia in other diseases classified elsewhere, mild, without behavioral disturbance, psychotic disturbance, mood disturbance, and anxiety: Secondary | ICD-10-CM | POA: Diagnosis not present

## 2024-04-30 DIAGNOSIS — G3184 Mild cognitive impairment, so stated: Secondary | ICD-10-CM | POA: Diagnosis not present

## 2024-04-30 DIAGNOSIS — I69320 Aphasia following cerebral infarction: Secondary | ICD-10-CM | POA: Diagnosis not present

## 2024-04-30 DIAGNOSIS — H9190 Unspecified hearing loss, unspecified ear: Secondary | ICD-10-CM | POA: Diagnosis not present

## 2024-04-30 DIAGNOSIS — I5022 Chronic systolic (congestive) heart failure: Secondary | ICD-10-CM | POA: Diagnosis not present

## 2024-04-30 DIAGNOSIS — I130 Hypertensive heart and chronic kidney disease with heart failure and stage 1 through stage 4 chronic kidney disease, or unspecified chronic kidney disease: Secondary | ICD-10-CM | POA: Diagnosis not present

## 2024-04-30 NOTE — Progress Notes (Signed)
 I agree with the above plan

## 2024-04-30 NOTE — Telephone Encounter (Signed)
 It may be worth a Neurology question if they would recommend switching anticoagulation, if he was compliant on Eliquis  when he had a stroke, presumable from Afib.  Thanks MJP

## 2024-04-30 NOTE — Telephone Encounter (Signed)
 Jesus Lam, You followed this patient for his DVT. Any suspicion for non-compliance?  Fill hx is good for monthly fills.  Neurology note mentions missed doses but family says compliant He had another CVA- afib found on monitor. Might be worth switching to Xarelto

## 2024-04-30 NOTE — Telephone Encounter (Signed)
 If no compliance with Eliquis  when he had a stroke, not a Eliquis  failure. That said, switching to Xarelto may still be a good idea given once daily dosing.  Thanks MJP

## 2024-04-30 NOTE — Telephone Encounter (Signed)
 Adding Renelda Carry to the chat as he saw him most recently, as well as pharmacy. Did the stroke happen while on Eliquis  5 mg bid? If yes, we may need to consider alternate anticoagulation. What did Neurology recommend after the stroke?  Thanks MJP

## 2024-05-03 ENCOUNTER — Telehealth: Payer: Self-pay

## 2024-05-03 NOTE — Telephone Encounter (Signed)
 Copied from CRM [id number]. Topic: Referral - Question >> May 03, 2024 10:14 AM Carlatta H wrote: Reason for CRM: Patient daughter would like him referred to a different home health agency due to lack of treatment//she would like him to use [facility]//Please call daughter [phone]  Spoke with daughter - she wants PT, OT, and speech through [facility].  Let her know his PCP will be notified when she returns.

## 2024-05-03 NOTE — Telephone Encounter (Signed)
 Can you weigh in on this? Thank you.

## 2024-05-04 NOTE — Telephone Encounter (Signed)
 Left a message with Jesus Lam's recommendations. Requested a call back with any questions or concerns.

## 2024-05-18 ENCOUNTER — Encounter: Payer: Self-pay | Admitting: Nurse Practitioner

## 2024-05-18 ENCOUNTER — Other Ambulatory Visit: Payer: Self-pay | Admitting: Nurse Practitioner

## 2024-05-18 DIAGNOSIS — E0590 Thyrotoxicosis, unspecified without thyrotoxic crisis or storm: Secondary | ICD-10-CM

## 2024-05-19 ENCOUNTER — Encounter: Payer: Self-pay | Admitting: Internal Medicine

## 2024-05-19 ENCOUNTER — Ambulatory Visit (INDEPENDENT_AMBULATORY_CARE_PROVIDER_SITE_OTHER): Admitting: Internal Medicine

## 2024-05-19 VITALS — BP 128/72 | HR 78 | Ht 72.0 in | Wt 241.0 lb

## 2024-05-19 DIAGNOSIS — E042 Nontoxic multinodular goiter: Secondary | ICD-10-CM

## 2024-05-19 DIAGNOSIS — E0590 Thyrotoxicosis, unspecified without thyrotoxic crisis or storm: Secondary | ICD-10-CM

## 2024-05-19 MED ORDER — METHIMAZOLE 10 MG PO TABS: 10.0000 mg | ORAL_TABLET | Freq: Every day | ORAL | 6 refills | Status: AC

## 2024-05-19 NOTE — Patient Instructions (Signed)
 Start methimazole  10 mg, 1 tablet daily

## 2024-05-19 NOTE — Progress Notes (Signed)
 Name: Jesus Lam  MRN/ DOB: [id number], 01-20-1933    Age/ Sex: 89 y.o., male    PCP: Georgina Speaks, FNP   Reason for Endocrinology Evaluation: Subclinical hyperthyroidism     Date of Initial Endocrinology Evaluation: 08/20/2022    HPI: Jesus Lam is a 89 y.o. male with a past medical history of HTN, PVD, Hx of CVA. The patient presented for initial endocrinology clinic visit on 08/20/2022 for consultative assistance with his subclinical hyperthyroidism.   Patient has been noted with low TSH since December 2019 with a nadir of 0.018 u IU/mL, with normal T4 and T3.  Patient with normal TPO antibodies as well as undetectable TRAb  Thyroid  ultrasound 01/2019 revealed multiple thyroid  nodules  He is s/p benign FNA of the left mid 7.2 cm and right mid 2.5 cm nodules in May 2020  Methimazole  was started in 10/2018  On his initial visit to our clinic it was difficult to obtain any clear history from the patient, a call to his pharmacy confirmed that he did not pick methimazole  for approximately 4 months prior.   I will start him on methimazole  in 07/2022 but he was lost to follow-up until his return to our clinic 04/2024   SUBJECTIVE:    Today (05/19/24):  Jesus Lam is here for follow-up on hyperthyroidism.     He is accompanied by his daughter Daysean Tinkham Weight has been stable over the past year Denies palpitations  Has chronic occasional hand tremors  Denies loose stools or diarrhea  Stable neck swelling   Methimazole  5 mg daily      HISTORY:  Past Medical History:  Past Medical History:  Diagnosis Date   Anemia    BPH (benign prostatic hyperplasia)    Cataracts, bilateral    CVA (cerebral vascular accident) (HCC)    DVT (deep venous thrombosis) (HCC)    dvt in left leg   Dyslipidemia    Dysuria 06/17/2023   Gout    Gout    HTN (hypertension)    Left hip pain 03/15/2020   Left leg pain    Lower abdominal pain 11/03/2023   Osteoarthritis     PAD (peripheral artery disease) (HCC)    Stasis dermatitis    Stroke (HCC)    Vitamin D  deficiency    Past Surgical History:  Past Surgical History:  Procedure Laterality Date   ABDOMINAL AORTOGRAM N/A 01/13/2018   Procedure: ABDOMINAL AORTOGRAM;  Surgeon: Elmira Newman PARAS, MD;  Location: [facility];  Service: Cardiovascular;  Laterality: N/A;   CATARACT EXTRACTION, BILATERAL     LEFT HEART CATH AND CORONARY ANGIOGRAPHY N/A 01/13/2018   Procedure: LEFT HEART CATH AND CORONARY ANGIOGRAPHY;  Surgeon: Elmira Newman PARAS, MD;  Location: [facility];  Service: Cardiovascular;  Laterality: N/A;   LOWER EXTREMITY ANGIOGRAPHY N/A 01/13/2018   Procedure: LOWER EXTREMITY ANGIOGRAPHY;  Surgeon: Elmira Newman PARAS, MD;  Location: [facility];  Service: Cardiovascular;  Laterality: N/A;   LOWER EXTREMITY ANGIOGRAPHY Right 01/27/2018   Procedure: LOWER EXTREMITY ANGIOGRAPHY;  Surgeon: Ladona Heinz, MD;  Location: [facility];  Service: Cardiovascular;  Laterality: Right;   PERIPHERAL VASCULAR ATHERECTOMY Left 01/13/2018   Procedure: PERIPHERAL VASCULAR ATHERECTOMY;  Surgeon: Elmira Newman PARAS, MD;  Location: [facility];  Service: Cardiovascular;  Laterality: Left;  SFA WITH PTA DRUG COATED BALLOON   PERIPHERAL VASCULAR INTERVENTION  01/27/2018   Procedure: PERIPHERAL VASCULAR INTERVENTION;  Surgeon: Ladona Heinz, MD;  Location: [facility];  Service: Cardiovascular;;    Social History:  reports that he quit smoking about 45 years ago. His smoking use included cigarettes. He started smoking about 46 years ago. He has a 0.1 pack-year smoking history. He has never used smokeless tobacco. He reports current alcohol use of about 6.0 - 7.0 standard drinks of alcohol per week. He reports current drug use. Drug: Marijuana. Family History: family history includes Alcohol abuse in his father; Breast cancer in his daughter; CVA in his daughter; Cancer in his father and  mother; Stroke in his daughter.   HOME MEDICATIONS: Allergies as of 05/19/2024       Reactions   Pork-derived Products Other (See Comments)   Religious    Shellfish Allergy Other (See Comments)   Gout         Medication List        Accurate as of May 19, 2024  6:53 AM. If you have any questions, ask your nurse or doctor.          amLODipine  5 MG tablet Commonly known as: NORVASC  TAKE ONE TABLET BY MOUTH DAILY   apixaban  5 MG Tabs tablet Commonly known as: ELIQUIS  Take 1 tablet (5 mg total) by mouth 2 (two) times daily. Start taking after completion of starter pack.   ascorbic acid  500 MG tablet Commonly known as: VITAMIN C  Take 500 mg daily by mouth.   atorvastatin  40 MG tablet Commonly known as: LIPITOR  Take 2 tablets (80 mg total) by mouth daily.   azelastine  0.1 % nasal spray Commonly known as: ASTELIN  Place 2 sprays into both nostrils 2 (two) times daily. Use in each nostril as directed   COMPLETE SENIOR PO 1 tablet daily.   cyanocobalamin  500 MCG tablet Commonly known as: VITAMIN B12 Take 500 mcg by mouth daily.   diclofenac  Sodium 1 % Gel Commonly known as: VOLTAREN  APPLY TWO GRAMS TOPICALLY FOUR TIMES A DAY What changed: See the new instructions.   furosemide  20 MG tablet Commonly known as: LASIX  Take 20 mg by mouth daily.   furosemide  20 MG tablet Commonly known as: LASIX  Take 1 tablet (20 mg total) by mouth as needed for fluid or edema (once a day).   GNP 8 Hour Arthritis Relief 650 MG CR tablet Generic drug: acetaminophen  TAKE ONE TABLET BY MOUTH TWICE A DAY   hydrALAZINE  50 MG tablet Commonly known as: APRESOLINE  TAKE ONE TABLET BY MOUTH THREE TIMES A DAY   methimazole  5 MG tablet Commonly known as: TAPAZOLE  Take 1 tablet (5 mg total) by mouth 2 (two) times daily.   tamsulosin  0.4 MG Caps capsule Commonly known as: FLOMAX  TAKE ONE CAPSULE BY MOUTH DAILY HALF HOUR FOLLOWING THE SAME MEAL DAILY What changed: See the new  instructions.   Vitamin D3 125 MCG (5000 UT) Caps Take 5,000 Units by mouth daily.          REVIEW OF SYSTEMS: A comprehensive ROS was conducted with the patient and is negative except as per HPI    OBJECTIVE:  VS: BP 128/72 (BP Location: Right Arm, Patient Position: Sitting, Cuff Size: Normal)   Pulse 78   Ht 6' (1.829 m)   Wt 241 lb (109.3 kg)   SpO2 99%   BMI 32.69 kg/m    Wt Readings from Last 3 Encounters:  04/29/24 238 lb (108 kg)  04/26/24 237 lb (107.5 kg)  04/15/24 233 lb 6.4 oz (105.9 kg)     EXAM: General: Pt appears  well and is in NAD  Neck: General: Supple without adenopathy. Thyroid : Thyroid  size normal.  No goiter or nodules appreciated.   Lungs: Clear with good BS bilat  Heart: Auscultation: RRR.  Extremities:  BL LE: No pretibial edema   Mental Status: Judgment, insight: Intact Orientation: Oriented to time, place, and person Mood and affect: No depression, anxiety, or agitation     DATA REVIEWED:   Latest Reference Range & Units 03/23/24 08:36  TSH 0.350 - 4.500 uIU/mL <0.010 (L)  T4,Free(Direct) 0.61 - 1.12 ng/dL 8.69 (H)     Latest Reference Range & Units 04/15/24 12:33  Sodium 134 - 144 mmol/L 146 (H)  Potassium 3.5 - 5.2 mmol/L 4.7  Chloride 96 - 106 mmol/L 107 (H)  CO2 20 - 29 mmol/L 23  Glucose 70 - 99 mg/dL 896 (H)  BUN 8 - 27 mg/dL 12  Creatinine 9.23 - 8.72 mg/dL 8.89  Calcium  8.6 - 10.2 mg/dL 9.6  BUN/Creatinine Ratio 10 - 24  11  eGFR >59 mL/min/1.73 64  Alkaline Phosphatase 44 - 121 IU/L 101  Albumin 3.7 - 4.7 g/dL 3.7  AST 0 - 40 IU/L 14  ALT 0 - 44 IU/L 15  Total Protein 6.0 - 8.5 g/dL 6.7  Total Bilirubin 0.0 - 1.2 mg/dL 0.5     Thyroid  ultrasound 02/05/2019  Estimated total number of nodules >/= 1 cm: 2   Number of spongiform nodules >/=  2 cm not described below (TR1): 0   Number of mixed cystic and solid nodules >/= 1.5 cm not described below (TR2): 0    _________________________________________________________   Nodule # 1:   Location: Right; Mid   Maximum size: 2.5 cm; Other 2 dimensions: 2.0 x 1.4 cm   Composition: solid/almost completely solid (2)   Echogenicity: isoechoic (1)   Shape: not taller-than-wide (0)   Margins: smooth (0)   Echogenic foci: none (0)   ACR TI-RADS total points: 3.   ACR TI-RADS risk category: TR3 (3 points).   ACR TI-RADS recommendations:   **Given size (>/= 2.5 cm) and appearance, fine needle aspiration of this mildly suspicious nodule should be considered based on TI-RADS criteria.   _________________________________________________________   Nodule # 2:   Location: Left; Mid   Maximum size: 7.2 cm; Other 2 dimensions: 4.5 x 4.8 cm   Composition: solid/almost completely solid (2)   Echogenicity: isoechoic (1)   Shape: not taller-than-wide (0)   Margins: smooth (0)   Echogenic foci: none (0)   ACR TI-RADS total points: 3.   ACR TI-RADS risk category: TR3 (3 points).   ACR TI-RADS recommendations:   **Given size (>/= 2.5 cm) and appearance, fine needle aspiration of this mildly suspicious nodule should be considered based on TI-RADS criteria.   _________________________________________________________   IMPRESSION: 1. Nodule # 1, a 2.5 cm TI-RADS category 3 nodule in the medial right mid gland meets criteria for consideration of fine-needle aspiration biopsy. 2. Nodule # 2, a large 7.2 cm TI-RADS category 3 nodule occupying the majority of the left thyroid  gland meets criteria for consideration of fine-needle aspiration biopsy.   FNA left mid 7.2 cm nodule 04/15/2019  THYROID  FINE NEEDLE ASPIRATION LEFT LOBE (SPECIMEN 2 OF 2, COLLECTED 04/15/2019): CONSISTENT WITH BENIGN FOLLICULAR NODULE (BETHESDA CATEGORY II).   FNA right mid 2.5 cm nodule 04/15/2019   THYROID  FINE NEEDLE ASPIRATION MID RIGHT LOBE (SPECIMEN 1 OF 2, COLLECTED 04/15/2019): CONSISTENT WITH BENIGN  FOLLICULAR NODULE (BETHESDA CATEGORY II).  ASSESSMENT/PLAN/RECOMMENDATIONS:   Multinodular goiter:  -  No local neck symptoms -Patient is s/p benign FNA of bilateral nodules in 2020 -This is most likely the reason for his hyperthyroidism    2.  Hyperthyroidism  -Patient is clinically euthyroid - Most recent labs have been reviewed -Will restart methimazole  as below -Will recheck labs in 2 months    Medication Start  methimazole  10 mg, 1 tablet daily  Follow-up in 4 months  Signed electronically by: Stefano Redgie Butts, MD  [facility]  [facility] [address] [place], [place] [address] Phone: [phone] FAX: [phone]   CC: Georgina Speaks, FNP [address] [place] [address] Phone: [phone] Fax: [phone]   Return to Endocrinology clinic as below: Future Appointments  Date Time Provider Department Center  05/19/2024 11:50 AM Katilyn Miltenberger, Donell Redgie, MD [facility] None  06/16/2024 10:20 AM [facility]-ANNUAL WELLNESS VISIT [facility] None  06/16/2024 11:00 AM Georgina Speaks, FNP [facility] None  07/30/2024 11:20 AM Wyn Jackee VEAR Mickey., NP [facility] H&V  12/02/2024  1:30 PM Rosemarie Eather RAMAN, MD [facility] None

## 2024-05-24 NOTE — Addendum Note (Signed)
 Addended by: GEORGINA SPEAKS F on: 05/24/2024 11:18 AM   Modules accepted: Level of Service

## 2024-05-24 NOTE — Progress Notes (Signed)
 Updated

## 2024-05-29 DIAGNOSIS — R300 Dysuria: Secondary | ICD-10-CM | POA: Diagnosis not present

## 2024-05-29 DIAGNOSIS — N390 Urinary tract infection, site not specified: Secondary | ICD-10-CM | POA: Diagnosis not present

## 2024-06-03 ENCOUNTER — Emergency Department (HOSPITAL_COMMUNITY)

## 2024-06-03 ENCOUNTER — Emergency Department (HOSPITAL_COMMUNITY)
Admission: EM | Admit: 2024-06-03 | Discharge: 2024-06-04 | Disposition: A | Discharge status: Home / Self Care | Attending: Emergency Medicine | Admitting: Emergency Medicine

## 2024-06-03 ENCOUNTER — Other Ambulatory Visit (HOSPITAL_COMMUNITY)

## 2024-06-03 DIAGNOSIS — Z8673 Personal history of transient ischemic attack (TIA), and cerebral infarction without residual deficits: Secondary | ICD-10-CM | POA: Diagnosis not present

## 2024-06-03 DIAGNOSIS — I69392 Facial weakness following cerebral infarction: Secondary | ICD-10-CM | POA: Diagnosis not present

## 2024-06-03 DIAGNOSIS — Z87891 Personal history of nicotine dependence: Secondary | ICD-10-CM | POA: Insufficient documentation

## 2024-06-03 DIAGNOSIS — Z7901 Long term (current) use of anticoagulants: Secondary | ICD-10-CM | POA: Insufficient documentation

## 2024-06-03 DIAGNOSIS — R471 Dysarthria and anarthria: Secondary | ICD-10-CM | POA: Diagnosis not present

## 2024-06-03 DIAGNOSIS — G319 Degenerative disease of nervous system, unspecified: Secondary | ICD-10-CM | POA: Diagnosis not present

## 2024-06-03 DIAGNOSIS — R410 Disorientation, unspecified: Secondary | ICD-10-CM | POA: Diagnosis not present

## 2024-06-03 DIAGNOSIS — I739 Peripheral vascular disease, unspecified: Secondary | ICD-10-CM | POA: Diagnosis not present

## 2024-06-03 DIAGNOSIS — I6782 Cerebral ischemia: Secondary | ICD-10-CM | POA: Diagnosis not present

## 2024-06-03 DIAGNOSIS — R4701 Aphasia: Secondary | ICD-10-CM | POA: Insufficient documentation

## 2024-06-03 DIAGNOSIS — Z79899 Other long term (current) drug therapy: Secondary | ICD-10-CM | POA: Diagnosis not present

## 2024-06-03 DIAGNOSIS — I63512 Cerebral infarction due to unspecified occlusion or stenosis of left middle cerebral artery: Secondary | ICD-10-CM | POA: Diagnosis not present

## 2024-06-03 DIAGNOSIS — R29810 Facial weakness: Secondary | ICD-10-CM | POA: Diagnosis not present

## 2024-06-03 DIAGNOSIS — I10 Essential (primary) hypertension: Secondary | ICD-10-CM | POA: Diagnosis not present

## 2024-06-03 DIAGNOSIS — I4891 Unspecified atrial fibrillation: Secondary | ICD-10-CM | POA: Diagnosis not present

## 2024-06-03 DIAGNOSIS — R799 Abnormal finding of blood chemistry, unspecified: Secondary | ICD-10-CM | POA: Insufficient documentation

## 2024-06-03 DIAGNOSIS — I69320 Aphasia following cerebral infarction: Secondary | ICD-10-CM | POA: Diagnosis not present

## 2024-06-03 DIAGNOSIS — I639 Cerebral infarction, unspecified: Secondary | ICD-10-CM | POA: Diagnosis not present

## 2024-06-03 DIAGNOSIS — G9389 Other specified disorders of brain: Secondary | ICD-10-CM | POA: Diagnosis not present

## 2024-06-03 DIAGNOSIS — I63233 Cerebral infarction due to unspecified occlusion or stenosis of bilateral carotid arteries: Secondary | ICD-10-CM | POA: Diagnosis not present

## 2024-06-03 LAB — ETHANOL: Alcohol, Ethyl (B): <15 mg/dL (ref <15)

## 2024-06-03 LAB — APTT: aPTT: 31 s (ref 24–36)

## 2024-06-03 LAB — COMPREHENSIVE METABOLIC PANEL WITH GFR
ALT: 19 U/L (ref 0–44)
AST: 24 U/L (ref 15–41)
Albumin: 3.5 g/dL (ref 3.5–5.0)
Alkaline Phosphatase: 76 U/L (ref 38–126)
Anion gap: 9 (ref 5–15)
BUN: 18 mg/dL (ref 8–23)
CO2: 24 mmol/L (ref 22–32)
Calcium: 9.3 mg/dL (ref 8.9–10.3)
Chloride: 108 mmol/L (ref 98–111)
Creatinine, Ser: 1.33 mg/dL — ABNORMAL HIGH (ref 0.61–1.24)
GFR, Estimated: 51 mL/min — ABNORMAL LOW (ref >60)
Glucose, Bld: 113 mg/dL — ABNORMAL HIGH (ref 70–99)
Potassium: 4.4 mmol/L (ref 3.5–5.1)
Sodium: 141 mmol/L (ref 135–145)
Total Bilirubin: 0.8 mg/dL (ref 0.0–1.2)
Total Protein: 7.6 g/dL (ref 6.5–8.1)

## 2024-06-03 LAB — DIFFERENTIAL
Abs Immature Granulocytes: 0.03 K/uL (ref 0.00–0.07)
Basophils Absolute: 0 K/uL (ref 0.0–0.1)
Basophils Relative: 1 %
Eosinophils Absolute: 0.1 K/uL (ref 0.0–0.5)
Eosinophils Relative: 2 %
Immature Granulocytes: 0 %
Lymphocytes Relative: 35 %
Lymphs Abs: 2.5 K/uL (ref 0.7–4.0)
Monocytes Absolute: 0.7 K/uL (ref 0.1–1.0)
Monocytes Relative: 9 %
Neutro Abs: 3.9 K/uL (ref 1.7–7.7)
Neutrophils Relative %: 53 %

## 2024-06-03 LAB — I-STAT CHEM 8, ED
BUN: 19 mg/dL (ref 8–23)
Calcium, Ion: 1.15 mmol/L (ref 1.15–1.40)
Chloride: 109 mmol/L (ref 98–111)
Creatinine, Ser: 1.4 mg/dL — ABNORMAL HIGH (ref 0.61–1.24)
Glucose, Bld: 109 mg/dL — ABNORMAL HIGH (ref 70–99)
HCT: 37 % — ABNORMAL LOW (ref 39.0–52.0)
Hemoglobin: 12.6 g/dL — ABNORMAL LOW (ref 13.0–17.0)
Potassium: 4.5 mmol/L (ref 3.5–5.1)
Sodium: 141 mmol/L (ref 135–145)
TCO2: 23 mmol/L (ref 22–32)

## 2024-06-03 LAB — PROTIME-INR
INR: 1.1 (ref 0.8–1.2)
Prothrombin Time: 14.5 s (ref 11.4–15.2)

## 2024-06-03 LAB — CBC
HCT: 38.1 % — ABNORMAL LOW (ref 39.0–52.0)
Hemoglobin: 11.8 g/dL — ABNORMAL LOW (ref 13.0–17.0)
MCH: 29.2 pg (ref 26.0–34.0)
MCHC: 31 g/dL (ref 30.0–36.0)
MCV: 94.3 fL (ref 80.0–100.0)
Platelets: 172 K/uL (ref 150–400)
RBC: 4.04 MIL/uL — ABNORMAL LOW (ref 4.22–5.81)
RDW: 14.6 % (ref 11.5–15.5)
WBC: 7.3 K/uL (ref 4.0–10.5)
nRBC: 0 % (ref 0.0–0.2)

## 2024-06-03 MED ORDER — IOHEXOL 350 MG/ML SOLN
100.0000 mL | Freq: Once | INTRAVENOUS | Status: AC | PRN
  Administered 2024-06-03: 100 mL via INTRAVENOUS

## 2024-06-03 MED ORDER — SODIUM CHLORIDE 0.9% FLUSH
3.0000 mL | Freq: Once | INTRAVENOUS | Status: AC
  Administered 2024-06-03: 3 mL via INTRAVENOUS

## 2024-06-03 MED ORDER — LORAZEPAM 2 MG/ML IJ SOLN
2.0000 mg | Freq: Once | INTRAMUSCULAR | Status: AC
  Administered 2024-06-03: 2 mg via INTRAVENOUS
  Filled 2024-06-03: qty 1

## 2024-06-03 NOTE — Code Documentation (Signed)
 Stroke Response Nurse Documentation Code Documentation  Prudencio Bolger is a 89 y.o. male arriving to [facility]  via [facility] on 7/10 with past medical hx of HTN, HLD, CVA, . On Eliquis  (apixaban ) daily. Code stroke was activated by EMS.   Patient from home where he was LKW at 1400 and now complaining of right sided facial droop and aphasia .   Stroke team at the bedside on patient arrival. Labs drawn and patient cleared for CT by Dr. Bari. Patient to CT with team. NIHSS 6, see documentation for details and code stroke times. Patient with disoriented, right facial droop, bilateral leg weakness, Expressive aphasia , and dysarthria  on exam. The following imaging was completed:  CT Head, CTA, and CTP. Patient is not a candidate for IV Thrombolytic due to outside of window/Eliquis . Patient is not a candidate for IR due to No LVO.   Care Plan: Neuro checks q2 hrs.    Bedside handoff with ED RN.    Griselda Alm ORN  Rapid Response RN

## 2024-06-03 NOTE — ED Provider Notes (Signed)
 [place] EMERGENCY DEPARTMENT AT [facility] Provider Note   CSN: [id number] Arrival date & time: 06/03/24  2236  An emergency department physician performed an initial assessment on this suspected stroke patient at 2235.  Patient presents with: No chief complaint on file.   Bary Limbach is a 89 y.o. male.   HPI   89 year old male with past medical history of previous CVA anticoagulated on Eliquis  presents emergency department as a code stroke.  Patient initially evaluated at the EMS bridge with the stroke neurology team at bedside.  Reported that patient's last known normal was 1400.  He now presents with concern for right-sided facial droop and aphasia.  CBG is appropriate on arrival, vitals are stable.  Patient went straight to the CT scanner.  Level 5 caveat secondary to acuity.  Prior to Admission medications   Medication Sig Start Date End Date Taking? Authorizing Provider  amLODipine  (NORVASC ) 5 MG tablet TAKE ONE TABLET BY MOUTH DAILY 04/05/24   Patwardhan, Manish J, MD  apixaban  (ELIQUIS ) 5 MG TABS tablet Take 1 tablet (5 mg total) by mouth 2 (two) times daily. Start taking after completion of starter pack. 09/25/23   Barbarann Dixon B, RPH-CPP  atorvastatin  (LIPITOR ) 40 MG tablet Take 2 tablets (80 mg total) by mouth daily. 03/10/24   Patwardhan, Newman PARAS, MD  azelastine  (ASTELIN ) 0.1 % nasal spray Place 2 sprays into both nostrils 2 (two) times daily. Use in each nostril as directed 08/15/21   Georgina Speaks, FNP  Cholecalciferol  (VITAMIN D3) 5000 units CAPS Take 5,000 Units by mouth daily. 01/09/18   [provider]  diclofenac  Sodium (VOLTAREN ) 1 % GEL APPLY TWO GRAMS TOPICALLY FOUR TIMES A DAY Patient taking differently: Apply 1 Application topically daily as needed (pain). 07/01/22   Georgina Speaks, FNP  furosemide  (LASIX ) 20 MG tablet Take 20 mg by mouth daily. Patient not taking: Reported on 05/19/2024 10/24/23   [provider]  furosemide  (LASIX )  20 MG tablet Take 1 tablet (20 mg total) by mouth as needed for fluid or edema (once a day). 04/29/24 07/28/24  Wyn Jackee VEAR Mickey., NP  GNP 8 HOUR ARTHRITIS RELIEF 650 MG CR tablet TAKE ONE TABLET BY MOUTH TWICE A DAY 03/24/24   Georgina Speaks, FNP  hydrALAZINE  (APRESOLINE ) 50 MG tablet TAKE ONE TABLET BY MOUTH THREE TIMES A DAY 04/05/24   Patwardhan, Manish J, MD  methimazole  (TAPAZOLE ) 10 MG tablet Take 1 tablet (10 mg total) by mouth daily. 05/19/24   Shamleffer, Ibtehal Jaralla, MD  Multiple Vitamins-Minerals (COMPLETE SENIOR PO) 1 tablet daily.  06/06/18   [provider]  tamsulosin  (FLOMAX ) 0.4 MG CAPS capsule TAKE ONE CAPSULE BY MOUTH DAILY HALF HOUR FOLLOWING THE SAME MEAL DAILY Patient taking differently: Take 0.8 mg by mouth daily. TAKE ONE CAPSULE BY MOUTH DAILY HALF HOUR FOLLOWING THE SAME MEAL DAILY 11/13/21   Georgina Speaks, FNP  vitamin B-12 (CYANOCOBALAMIN ) 500 MCG tablet Take 500 mcg by mouth daily.    [provider]  vitamin C  (ASCORBIC ACID ) 500 MG tablet Take 500 mg daily by mouth.    [provider]    Allergies: Pork-derived products and Shellfish allergy    Review of Systems  Unable to perform ROS: Acuity of condition    Updated Vital Signs BP (!) 153/87   Pulse 100   Temp 99.5 F (37.5 C) (Oral)   Resp 17   Ht 6' (1.829 m)   Wt 103.6 kg   SpO2 98%  BMI 30.98 kg/m   Physical Exam Vitals and nursing note reviewed.  Constitutional:      Appearance: Normal appearance.  HENT:     Head: Normocephalic.     Mouth/Throat:     Mouth: Mucous membranes are moist.  Eyes:     Pupils: Pupils are equal, round, and reactive to light.  Cardiovascular:     Rate and Rhythm: Normal rate.  Pulmonary:     Effort: Pulmonary effort is normal. No respiratory distress.  Abdominal:     Palpations: Abdomen is soft.     Tenderness: There is no abdominal tenderness.  Skin:    General: Skin is warm.  Neurological:     Mental Status: He is alert.      Comments: Difficulty with speech, mild facial droop, full NIH difficult secondary to acuity, performed by the stroke neurology team  Psychiatric:        Mood and Affect: Mood normal.     (all labs ordered are listed, but only abnormal results are displayed) Labs Reviewed  CBC - Abnormal; Notable for the following components:      Result Value   RBC 4.04 (*)    Hemoglobin 11.8 (*)    HCT 38.1 (*)    All other components within normal limits  I-STAT CHEM 8, ED - Abnormal; Notable for the following components:   Creatinine, Ser 1.40 (*)    Glucose, Bld 109 (*)    Hemoglobin 12.6 (*)    HCT 37.0 (*)    All other components within normal limits  PROTIME-INR  APTT  DIFFERENTIAL  COMPREHENSIVE METABOLIC PANEL WITH GFR  ETHANOL  CBC  COMPREHENSIVE METABOLIC PANEL WITH GFR  I-STAT CHEM 8, ED  CBG MONITORING, ED    EKG: None  Radiology: CT ANGIO HEAD NECK W WO CM W PERF (CODE STROKE) Result Date: 06/03/2024 CLINICAL DATA:  Eight initial evaluation for acute neuro deficit, stroke. EXAM: CT ANGIOGRAPHY HEAD AND NECK CT PERFUSION BRAIN TECHNIQUE: Multidetector CT imaging of the head and neck was performed using the standard protocol during bolus administration of intravenous contrast. Multiplanar CT image reconstructions and MIPs were obtained to evaluate the vascular anatomy. Carotid stenosis measurements (when applicable) are obtained utilizing NASCET criteria, using the distal internal carotid diameter as the denominator. Multiphase CT imaging of the brain was performed following IV bolus contrast injection. Subsequent parametric perfusion maps were calculated using RAPID software. RADIATION DOSE REDUCTION: This exam was performed according to the departmental dose-optimization program which includes automated exposure control, adjustment of the mA and/or kV according to patient size and/or use of iterative reconstruction technique. CONTRAST:  OMNIPAQUE  IOHEXOL  350 MG/ML SOLN  COMPARISON:  CT from earlier the same day as well as prior exam from 03/22/2024 FINDINGS: CTA NECK FINDINGS Aortic arch: Visualized aortic arch within normal limits for caliber. Moderate aortic atherosclerosis. Bovine branching pattern noted. No significant stenosis about the origin the great vessels. Right carotid system: Right common and internal carotid arteries are patent without dissection. Moderate calcified plaque about the right carotid bulb without hemodynamically significant greater than 50% stenosis. Left carotid system: Left common and internal carotid arteries are patent without dissection. Atheromatous plaque about the left carotid bulb without hemodynamically significant greater than 50% stenosis. Vertebral arteries: Both vertebral arteries arise from the subclavian arteries. Vertebral arteries patent without significant stenosis or dissection. No proximal subclavian artery stenosis. Skeleton: No worrisome osseous lesions. Advanced spondylosis at C5-6 through C7-T1. Multilevel facet arthrosis. Patient is edentulous. Other neck: No  other acute finding. Enlarged heterogeneous multinodular thyroid , more pronounced on the left. This has been previously evaluated by thyroid  ultrasound on 09/03/2022. 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 Upper chest: No other acute finding. Review of the MIP images confirms the above findings CTA HEAD FINDINGS Anterior circulation: Mild atheromatous change about the carotid siphons without stenosis. A1 segments patent bilaterally. Normal anterior communicating artery complex. Anterior cerebral arteries patent without significant stenosis. No M1 stenosis or occlusion. No proximal MCA branch occlusion. Attenuation of the distal left MCA branches as compared to the right, in keeping with the chronic left MCA territory infarct. Posterior circulation: Atheromatous change about the proximal V4 segments  bilaterally with moderate stenosis on the left (series 7, image 201). Both PICA grossly patent at their origins. Basilar patent without significant stenosis. Superior cerebellar and posterior cerebral arteries patent bilaterally. Venous sinuses: Grossly patent allowing for timing the contrast bolus. Anatomic variants: None significant.  No aneurysm. Review of the MIP images confirms the above findings CT Brain Perfusion Findings: ASPECTS: 10 CBF (<30%) Volume: 0mL Perfusion (Tmax>6.0s) volume: 7mL Mismatch Volume: 7mL Infarction Location:Negative CT perfusion for acute ischemia. 7 mL perfusion deficit at the posterior left occipital region, corresponding with the chronic left MCA territory infarct. No other perfusion abnormality. IMPRESSION: 1. Negative CTA for large vessel occlusion or other emergent finding. 2. Negative CT perfusion for acute ischemia or other perfusion abnormality. 3. Moderate atheromatous stenosis involving the proximal left V4 segment. 4. Moderate atheromatous change elsewhere about the arterial vasculature of the head and neck, but no other hemodynamically significant or correctable stenosis. 5. 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 6.  Aortic Atherosclerosis (ICD10-I70.0). Case discussed by telephone at the time of interpretation on 06/03/2024 at 11:10 p.m. to provider ERIC LINDZEN. Electronically Signed   By: Morene Hoard M.D.   On: 06/03/2024 23:20   CT HEAD CODE STROKE WO CONTRAST Result Date: 06/03/2024 CLINICAL DATA:  Code stroke. EXAM: CT HEAD WITHOUT CONTRAST TECHNIQUE: Contiguous axial images were obtained from the base of the skull through the vertex without intravenous contrast. RADIATION DOSE REDUCTION: This exam was performed according to the departmental dose-optimization program which includes automated exposure control, adjustment of the mA and/or kV according to patient size and/or use of iterative reconstruction  technique. COMPARISON:  Prior study from 03/23/2024 FINDINGS: Brain: Examination technically limited by positioning. Age-related cerebral atrophy with mild chronic small vessel ischemic disease. Chronic posterior left cerebral infarcts again noted. Small remote right cerebellar infarct. No acute intracranial hemorrhage. No acute large vessel territory infarct. No mass lesion or midline shift. No hydrocephalus or extra-axial fluid collection. Vascular: No abnormal hyperdense vessel. Calcified atherosclerosis present at the skull base. Skull: Scalp soft tissues within normal limits.  Calvarium intact. Sinuses/Orbits: Globes and orbital soft tissues within normal small volume layering secretions noted within the right maxillary sinus. No significant mastoid effusion. Other: None. ASPECTS [facility] Stroke Program Early CT Score) - Ganglionic level infarction (caudate, lentiform nuclei, internal capsule, insula, M1-M3 cortex): 7 - Supraganglionic infarction (M4-M6 cortex): 3 Total score (0-10 with 10 being normal): 10 IMPRESSION: 1. No acute intracranial abnormality. 2. ASPECTS is 10. 3. Age-related cerebral atrophy with mild chronic microvascular ischemic disease. Chronic posterior left MCA and right cerebellar infarcts. Report These results were communicated to Dr. Lindzen at 10:58 pm on 06/03/2024 by text page via the [facility] messaging system. Electronically Signed   By: Morene Hoard HERO.D.  On: 06/03/2024 23:00     .Critical Care  Performed by: Bari Roxie HERO, DO Authorized by: Bari Roxie HERO, DO   Critical care provider statement:    Critical care time (minutes):  30   Critical care time was exclusive of:  Separately billable procedures and treating other patients   Critical care was necessary to treat or prevent imminent or life-threatening deterioration of the following conditions:  CNS failure or compromise   Critical care was time spent personally by me on the following activities:   Development of treatment plan with patient or surrogate, discussions with consultants, evaluation of patient's response to treatment, examination of patient, ordering and review of laboratory studies, ordering and review of radiographic studies, ordering and performing treatments and interventions, pulse oximetry, re-evaluation of patient's condition and review of old charts   I assumed direction of critical care for this patient from another provider in my specialty: no      Medications Ordered in the ED  sodium chloride  flush (NS) 0.9 % injection 3 mL (3 mLs Intravenous Given 06/03/24 2307)  iohexol  (OMNIPAQUE ) 350 MG/ML injection 100 mL (100 mLs Intravenous Contrast Given 06/03/24 2257)                                    Medical Decision Making Amount and/or Complexity of Data Reviewed Labs: ordered.   89 year old male presents emergency department concern for facial droop, aphasia.  Last known normal was around 1400.  CBG appropriate on arrival, vitals are normal, patient protecting his airway.  Patient brought emergently to the CT scanner, stroke neurology team at bedside.  CT head without shows no acute finding.  CTA pending.  Patient signed out pending neuroimaging, consults, lab evaluation/disposition.     Final diagnoses:  None    ED Discharge Orders     None          Bari Roxie HERO, DO 06/03/24 2325

## 2024-06-03 NOTE — ED Notes (Signed)
CBG 114 

## 2024-06-04 ENCOUNTER — Other Ambulatory Visit: Payer: Self-pay

## 2024-06-04 ENCOUNTER — Emergency Department (HOSPITAL_COMMUNITY)

## 2024-06-04 DIAGNOSIS — I69320 Aphasia following cerebral infarction: Secondary | ICD-10-CM | POA: Diagnosis not present

## 2024-06-04 DIAGNOSIS — G319 Degenerative disease of nervous system, unspecified: Secondary | ICD-10-CM | POA: Diagnosis not present

## 2024-06-04 DIAGNOSIS — I739 Peripheral vascular disease, unspecified: Secondary | ICD-10-CM | POA: Diagnosis not present

## 2024-06-04 DIAGNOSIS — R29810 Facial weakness: Secondary | ICD-10-CM | POA: Diagnosis not present

## 2024-06-04 DIAGNOSIS — I639 Cerebral infarction, unspecified: Secondary | ICD-10-CM | POA: Diagnosis not present

## 2024-06-04 DIAGNOSIS — I6782 Cerebral ischemia: Secondary | ICD-10-CM | POA: Diagnosis not present

## 2024-06-04 DIAGNOSIS — I69392 Facial weakness following cerebral infarction: Secondary | ICD-10-CM

## 2024-06-04 DIAGNOSIS — G9389 Other specified disorders of brain: Secondary | ICD-10-CM | POA: Diagnosis not present

## 2024-06-04 NOTE — ED Provider Notes (Signed)
 Blood pressure (!) 143/85, pulse 77, temperature 99.5 F (37.5 C), temperature source Oral, resp. rate 18, height 6' (1.829 m), weight 103.6 kg, SpO2 100%.  Assuming care from Dr. MARLA. Horton.  In short, Jesus Lam is a 89 y.o. male with a chief complaint of Code Stroke .  Refer to the original H&P for additional details.  The current plan of care is to follow up on MRI and Neurology recommendations.  01:29 AM  MRI without acute findings. Clear for d/c from Neuro perspective.     Darra Fonda MATSU, MD 06/05/24 [phone]

## 2024-06-04 NOTE — ED Notes (Signed)
Pt provided with sandwich and soda.

## 2024-06-04 NOTE — ED Notes (Signed)
 Pt assisted in urinating using a urinal. Pt expresses difficulty urinating and painful urination. EDP notified.

## 2024-06-04 NOTE — ED Notes (Signed)
 Pt o2 removed

## 2024-06-04 NOTE — ED Notes (Signed)
 CCMD contacted

## 2024-06-04 NOTE — Discharge Instructions (Signed)
 Please call the Neurology team to schedule an outpatient follow up. Your MRI did not show any evidence of stroke.

## 2024-06-04 NOTE — ED Triage Notes (Signed)
 Patient brought in by EMS from from home. Per EMS daughter stated she was watching him on the camera and he was not himself. EMS stated when they got there he have worst expressive aphasia. Patient has a history of CVA 8 weeks ago.

## 2024-06-04 NOTE — ED Notes (Signed)
 This paramedic stayed with pt during MRI

## 2024-06-04 NOTE — Consult Note (Signed)
 NEUROLOGY CONSULT NOTE   Date of service: June 04, 2024 Patient Name: Jesus Lam MRN:  [id number] DOB:  [birth year] Chief Complaint: Worsened speech relative to his baseline since recent stroke Requesting Provider: Darra Fonda MATSU, MD  History of Present Illness  Jesus Lam is a 89 y.o. male with a PMHx of anemia, dementia, BPH, bilateral cataracts, stroke in 2019, left parietal lobe stroke approximately 8 weeks ago (followed at [facility]), LLE DVT (on Eliquis ), cardiomyopathy, dyslipidemia, gout, HTN, left hip and leg pain, PAD, stasis dermatitis and vitamin D  deficiency who presents to the ED via EMS as a Code Stroke after he was noted by family to have acute worsening of his post-stroke aphasia and right facial droop. On EMS initial assessment, he had decreased vision in his left eye, an enlarged, asymmetric pupils (left 4 mm, right 3 mm) and right sided weakness. BP per EMS was 174/80.  CBG was 114 on arrival.   Home medications include Eliquis  and atorvastatin .   Note from his recent Neurology clinic visit on 6/2 has been reviewed: Jesus Lam is a 89 y.o. male with history of DVT on Eliquis , hypertension, hyperlipidemia, PAD, dementia, and hx of stroke 2019 who presented to ED on 03/22/2024 with aphasia.  Stroke workup revealed left MCA moderate infarct as well as small right cerebellar infarct, embolic pattern, likely due to missing dose of Eliquis . CTA head/neck unremarkable. EF 45-50%. LDL 49, A1c 4.8.  Recommended continuation of Eliquis  5 mg twice daily and atorvastatin  80 mg daily, family plans on monitoring/assisting with medications.  History of cardiomyopathy with improvement of EF noted on inpatient echo and sinus bradycardia with sinus pauses, medication adjustments made and advised outpatient cardiology follow-up with plans on pursuing cardiac monitor.  He was discharged to SNF for ongoing therapy needs. Today, 04/26/2024, patient is being seen for hospital follow-up accompanied  by his daughter.  Reports continued speech difficulty, right-sided weakness and gait impairment.  Returned back home from SNF about 2 weeks ago, was just seen by home health nurse last week and is trying to get therapies set up.  Lives in his own home but has supportive daughter who assist with ADLs and has aide assistance during the weekdays.  Ambulates with rolling walker outside of home and cane indoors, no recent falls. No new stroke/TIA symptoms. Remains on Eliquis  and atorvastatin , no side effects. Routinely follows with PCP for stroke risk factor management. Is scheduled with cardiology this week and endocrinology at end of the month. No further questions or concerns at this time.  LKW: 2:00 PM Modified rankin score: 3-Moderate disability-requires help but walks WITHOUT assistance IV Thrombolysis:  No: On anticoagulation EVT:  No: CTA negative for LVO   NIHSS components Score: Comment  1a Level of Conscious 0[x]  1[]  2[]  3[]      1b LOC Questions 0[]  1[]  2[x]       1c LOC Commands 0[x]  1[]  2[]       2 Best Gaze 0[x]  1[]  2[]       3 Visual 0[x]  1[]  2[]  3[]      4 Facial Palsy 0[x]  1[]  2[]  3[]      5a Motor Arm - left 0[x]  1[]  2[]  3[]  4[]  UN[]    5b Motor Arm - Right 0[x]  1[]  2[]  3[]  4[]  UN[]    6a Motor Leg - Left 0[x]  1[]  2[]  3[]  4[]  UN[]    6b Motor Leg - Right 0[x]  1[]  2[]  3[]  4[]  UN[]    7 Limb Ataxia 0[x]  1[]  2[]  UN[]      8  Sensory 0[]  1[x]  2[]  UN[]     Decreased on the right  9 Best Language 0[]  1[x]  2[]  3[]     Expressive dysphasia with dysfluency, word finding deficits, frequent paraphasic errors and halting speech  10 Dysarthria 0[]  1[x]  2[]  UN[]      11 Extinct. and Inattention 0[]  1[x]  2[]      Extinction to DSS on the right  TOTAL:   6      ROS  As per HPI. Comprehensive ROS deferred in the context of acuity of presentation.     Past History   Past Medical History:  Diagnosis Date   Anemia    BPH (benign prostatic hyperplasia)    Cataracts, bilateral    CVA (cerebral  vascular accident) (HCC)    DVT (deep venous thrombosis) (HCC)    dvt in left leg   Dyslipidemia    Dysuria 06/17/2023   Gout    Gout    HTN (hypertension)    Left hip pain 03/15/2020   Left leg pain    Lower abdominal pain 11/03/2023   Osteoarthritis    PAD (peripheral artery disease) (HCC)    Stasis dermatitis    Stroke (HCC)    Vitamin D  deficiency    Past Surgical History:  Procedure Laterality Date   ABDOMINAL AORTOGRAM N/A 01/13/2018   Procedure: ABDOMINAL AORTOGRAM;  Surgeon: Elmira Newman PARAS, MD;  Location: [facility];  Service: Cardiovascular;  Laterality: N/A;   CATARACT EXTRACTION, BILATERAL     LEFT HEART CATH AND CORONARY ANGIOGRAPHY N/A 01/13/2018   Procedure: LEFT HEART CATH AND CORONARY ANGIOGRAPHY;  Surgeon: Elmira Newman PARAS, MD;  Location: [facility];  Service: Cardiovascular;  Laterality: N/A;   LOWER EXTREMITY ANGIOGRAPHY N/A 01/13/2018   Procedure: LOWER EXTREMITY ANGIOGRAPHY;  Surgeon: Elmira Newman PARAS, MD;  Location: [facility];  Service: Cardiovascular;  Laterality: N/A;   LOWER EXTREMITY ANGIOGRAPHY Right 01/27/2018   Procedure: LOWER EXTREMITY ANGIOGRAPHY;  Surgeon: Ladona Heinz, MD;  Location: [facility];  Service: Cardiovascular;  Laterality: Right;   PERIPHERAL VASCULAR ATHERECTOMY Left 01/13/2018   Procedure: PERIPHERAL VASCULAR ATHERECTOMY;  Surgeon: Elmira Newman PARAS, MD;  Location: [facility];  Service: Cardiovascular;  Laterality: Left;  SFA WITH PTA DRUG COATED BALLOON   PERIPHERAL VASCULAR INTERVENTION  01/27/2018   Procedure: PERIPHERAL VASCULAR INTERVENTION;  Surgeon: Ladona Heinz, MD;  Location: [facility];  Service: Cardiovascular;;    Family History: Family History  Problem Relation Age of Onset   Cancer Mother    Cancer Father    Alcohol abuse Father    Breast cancer Daughter    Stroke Daughter    CVA Daughter     Social History  reports that he quit smoking about 45 years ago.  His smoking use included cigarettes. He started smoking about 46 years ago. He has a 0.1 pack-year smoking history. He has never used smokeless tobacco. He reports current alcohol use of about 6.0 - 7.0 standard drinks of alcohol per week. He reports current drug use. Drug: Marijuana.  Allergies  Allergen Reactions   Pork-Derived Products Other (See Comments)    Religious    Shellfish Allergy Other (See Comments)    Gout     Medications   Current Facility-Administered Medications:    0.9 %  sodium chloride  infusion, , Intravenous, PRN, Ghumman, Ramandeep, NP  Current Outpatient Medications:    amLODipine  (NORVASC ) 5 MG tablet, TAKE ONE TABLET BY MOUTH DAILY, Disp: 30 tablet,  Rfl: 0   apixaban  (ELIQUIS ) 5 MG TABS tablet, Take 1 tablet (5 mg total) by mouth 2 (two) times daily. Start taking after completion of starter pack., Disp: 60 tablet, Rfl: 5   atorvastatin  (LIPITOR ) 40 MG tablet, Take 2 tablets (80 mg total) by mouth daily., Disp: 180 tablet, Rfl: 1   azelastine  (ASTELIN ) 0.1 % nasal spray, Place 2 sprays into both nostrils 2 (two) times daily. Use in each nostril as directed, Disp: 30 mL, Rfl: 5   Cholecalciferol  (VITAMIN D3) 5000 units CAPS, Take 5,000 Units by mouth daily., Disp: , Rfl:    diclofenac  Sodium (VOLTAREN ) 1 % GEL, APPLY TWO GRAMS TOPICALLY FOUR TIMES A DAY (Patient taking differently: Apply 1 Application topically daily as needed (pain).), Disp: 100 g, Rfl: 1   furosemide  (LASIX ) 20 MG tablet, Take 20 mg by mouth daily. (Patient not taking: Reported on 05/19/2024), Disp: , Rfl:    furosemide  (LASIX ) 20 MG tablet, Take 1 tablet (20 mg total) by mouth as needed for fluid or edema (once a day)., Disp: 30 tablet, Rfl: 3   GNP 8 HOUR ARTHRITIS RELIEF 650 MG CR tablet, TAKE ONE TABLET BY MOUTH TWICE A DAY, Disp: 60 tablet, Rfl: 2   hydrALAZINE  (APRESOLINE ) 50 MG tablet, TAKE ONE TABLET BY MOUTH THREE TIMES A DAY, Disp: 90 tablet, Rfl: 0   methimazole  (TAPAZOLE ) 10 MG tablet,  Take 1 tablet (10 mg total) by mouth daily., Disp: 30 tablet, Rfl: 6   Multiple Vitamins-Minerals (COMPLETE SENIOR PO), 1 tablet daily. , Disp: , Rfl:    tamsulosin  (FLOMAX ) 0.4 MG CAPS capsule, TAKE ONE CAPSULE BY MOUTH DAILY HALF HOUR FOLLOWING THE SAME MEAL DAILY (Patient taking differently: Take 0.8 mg by mouth daily. TAKE ONE CAPSULE BY MOUTH DAILY HALF HOUR FOLLOWING THE SAME MEAL DAILY), Disp: 90 capsule, Rfl: 4   vitamin B-12 (CYANOCOBALAMIN ) 500 MCG tablet, Take 500 mcg by mouth daily., Disp: , Rfl:    vitamin C  (ASCORBIC ACID ) 500 MG tablet, Take 500 mg daily by mouth., Disp: , Rfl:   Vitals   Vitals:   21-Jun-2024 2330 2024/06/21 2345 06/04/24 0038 06/04/24 0045  BP: (!) 155/97 (!) 138/98  (!) 143/85  Pulse: (!) 105 90  77  Resp: 16 (!) 23  18  Temp:      TempSrc:      SpO2: 100% 93% 98% 100%  Weight:      Height:        Body mass index is 30.98 kg/m.   Physical Exam   Constitutional: Appears well-developed and well-nourished.  Psych: Affect appropriate to situation.  Eyes: No scleral injection.  HENT: No OP obstruction.  Head: Normocephalic.  Respiratory: Effort normal, non-labored breathing.    Neurologic Examination   See NIHSS  Labs/Imaging/Neurodiagnostic studies   CBC:  Recent Labs  Lab 06-21-24 2238 21-Jun-2024 2245  WBC 7.3  --   NEUTROABS 3.9  --   HGB 11.8* 12.6*  HCT 38.1* 37.0*  MCV 94.3  --   PLT 172  --    Basic Metabolic Panel:  Lab Results  Component Value Date   NA 141 2024/06/21   K 4.5 06-21-2024   CO2 24 21-Jun-2024   GLUCOSE 109 (H) 06-21-24   BUN 19 21-Jun-2024   CREATININE 1.40 (H) 06-21-2024   CALCIUM  9.3 June 21, 2024   GFRNONAA 51 (L) 06-21-2024   GFRAA 64 08/03/2020   Lipid Panel:  Lab Results  Component Value Date   LDLCALC 49 03/23/2024  HgbA1c:  Lab Results  Component Value Date   HGBA1C 4.8 03/23/2024   Urine Drug Screen:     Component Value Date/Time   LABOPIA NONE DETECTED 07/28/2022 0013   COCAINSCRNUR  NONE DETECTED 07/28/2022 0013   LABBENZ NONE DETECTED 07/28/2022 0013   AMPHETMU NONE DETECTED 07/28/2022 0013   THCU POSITIVE (A) 07/28/2022 0013   LABBARB NONE DETECTED 07/28/2022 0013    Alcohol Level     Component Value Date/Time   ETH <15 06/03/2024 2238   INR  Lab Results  Component Value Date   INR 1.1 06/03/2024   APTT  Lab Results  Component Value Date   APTT 31 06/03/2024     ASSESSMENT  89 y.o. male with a PMHx of anemia, dementia, BPH, bilateral cataracts, left parietal lobe stroke approximately 8 weeks ago (followed at [facility]), LLE DVT (on Eliquis ), dyslipidemia, gout, HTN, cardiomyopathy, left hip and leg pain, PAD, stasis dermatitis and vitamin D  deficiency who presents to the ED via EMS as a Code Stroke after he was noted by family to have acute worsening of his post-stroke aphasia and right facial droop.  - Exam reveals expressive aphasia, mild dysarthria and right sided sensory deficits. NIHSS 6. - CT head: No acute intracranial abnormality. ASPECTS is 10. Age-related cerebral atrophy with mild chronic microvascular ischemic disease. Chronic, medium-sized, wedge-shaped posterior left MCA and right cerebellar infarcts. - CTA of head and neck: Negative CTA for large vessel occlusion or other emergent finding. Negative CT perfusion for acute ischemia or other perfusion abnormality. Moderate atheromatous stenosis involving the proximal left V4 segment. Moderate atheromatous change elsewhere about the arterial vasculature of the head and neck, but no other hemodynamically significant or correctable stenosis. 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 Aortic atherosclerosis  - Impression: Acute worsening of pre-existing deficits from recent left parieto-frontal ischemic stroke. DDx includes apparent worsening of known deficits due to intercurrent illness, versus a new stroke involving the left cerebral hemisphere.   RECOMMENDATIONS  -  MRI brain - Frequent neuro checks - IVF - Continue his Eliquis  and atorvastatin  - BP management - Further recommendations following MRI.   Addendum: MRI brain:  1. No acute intracranial abnormality. 2. Chronic posterior left MCA distribution infarct, with additional small remote right cerebellar infarct. 3. Underlying age-related cerebral atrophy with minor chronic small vessel ischemic disease.    ______________________________________________________________________    Bonney SHARK, Sharika Mosquera, MD [facility]

## 2024-06-04 NOTE — ED Notes (Signed)
 Pt o2 ranging from 88%-94% since administration of ativan . Pt placed on 3L o2 via nasal cannula

## 2024-06-05 ENCOUNTER — Other Ambulatory Visit: Payer: Self-pay | Admitting: Cardiology

## 2024-06-07 ENCOUNTER — Other Ambulatory Visit: Payer: Self-pay | Admitting: Nurse Practitioner

## 2024-06-07 ENCOUNTER — Telehealth: Payer: Self-pay

## 2024-06-07 ENCOUNTER — Other Ambulatory Visit: Payer: Self-pay | Admitting: Cardiology

## 2024-06-07 DIAGNOSIS — I82452 Acute embolism and thrombosis of left peroneal vein: Secondary | ICD-10-CM

## 2024-06-07 LAB — CBG MONITORING, ED: Glucose-Capillary: 114 mg/dL — ABNORMAL HIGH (ref 70–99)

## 2024-06-07 MED ORDER — APIXABAN 5 MG PO TABS: 5.0000 mg | ORAL_TABLET | Freq: Two times a day (BID) | ORAL | 5 refills | Status: DC

## 2024-06-07 NOTE — Telephone Encounter (Signed)
 Daughter [name] states that every since patient has started Methimazole  he has been restless.  She states that he is up and down a lot and can't sit still.

## 2024-06-07 NOTE — Telephone Encounter (Signed)
 Patient daughter [name] has been notified and verbalized understanding.

## 2024-06-07 NOTE — Telephone Encounter (Signed)
 Prescription refill request for Eliquis  received. Indication:DVT/Stroke Last office visit: 04/29/24 [place])  Scr: 1.40 (06/03/24)  Age: 89 Weight: 103.6kg  Appropriate dose. Refill sent.

## 2024-06-07 NOTE — Telephone Encounter (Unsigned)
 Copied from CRM [id number]. Topic: Clinical - Medication Refill >> Jun 07, 2024  8:35 AM Everette C wrote: Medication: amLODipine  (NORVASC ) 5 MG tablet  apixaban  (ELIQUIS ) 5 MG TABS tablet  Has the patient contacted their pharmacy? Yes (Agent: If no, request that the patient contact the pharmacy for the refill. If patient does not wish to contact the pharmacy document the reason why and proceed with request.) (Agent: If yes, when and what did the pharmacy advise?)  This is the patient's preferred pharmacy:  [facility] - [place], [place] - [address] [facility] [address] [place] [address] Phone: [phone] Fax: [phone]  Is this the correct pharmacy for this prescription? Yes If no, delete pharmacy and type the correct one.   Has the prescription been filled recently? Yes  Is the patient out of the medication? Yes  Has the patient been seen for an appointment in the last year OR does the patient have an upcoming appointment? Yes  Can we respond through MyChart? No  Agent: Please be advised that Rx refills may take up to 3 business days. We ask that you follow-up with your pharmacy.

## 2024-06-08 ENCOUNTER — Other Ambulatory Visit: Payer: Self-pay

## 2024-06-08 MED ORDER — AMLODIPINE BESYLATE 5 MG PO TABS: 5.0000 mg | ORAL_TABLET | Freq: Every day | ORAL | 3 refills | Status: DC

## 2024-06-16 ENCOUNTER — Encounter: Payer: Self-pay | Admitting: Nurse Practitioner

## 2024-06-16 ENCOUNTER — Ambulatory Visit: Payer: Self-pay | Admitting: Nurse Practitioner

## 2024-06-16 ENCOUNTER — Ambulatory Visit: Payer: Medicare HMO

## 2024-06-16 VITALS — BP 140/64 | HR 74 | Temp 98.8°F | Ht 72.0 in | Wt 244.0 lb

## 2024-06-16 DIAGNOSIS — N1831 Chronic kidney disease, stage 3a: Secondary | ICD-10-CM

## 2024-06-16 DIAGNOSIS — R630 Anorexia: Secondary | ICD-10-CM | POA: Diagnosis not present

## 2024-06-16 DIAGNOSIS — Z741 Need for assistance with personal care: Secondary | ICD-10-CM | POA: Diagnosis not present

## 2024-06-16 DIAGNOSIS — G4709 Other insomnia: Secondary | ICD-10-CM | POA: Diagnosis not present

## 2024-06-16 DIAGNOSIS — R471 Dysarthria and anarthria: Secondary | ICD-10-CM | POA: Diagnosis not present

## 2024-06-16 DIAGNOSIS — I131 Hypertensive heart and chronic kidney 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 failure, with stage 1 through stage 4 chronic kidney disease, or unspecified chronic kidney disease: Secondary | ICD-10-CM | POA: Diagnosis not present

## 2024-06-16 DIAGNOSIS — E0590 Thyrotoxicosis, unspecified without thyrotoxic crisis or storm: Secondary | ICD-10-CM | POA: Diagnosis not present

## 2024-06-16 DIAGNOSIS — R410 Disorientation, unspecified: Secondary | ICD-10-CM

## 2024-06-16 DIAGNOSIS — Z0000 Encounter for general adult medical examination without abnormal findings: Secondary | ICD-10-CM

## 2024-06-16 DIAGNOSIS — R12 Heartburn: Secondary | ICD-10-CM | POA: Diagnosis not present

## 2024-06-16 DIAGNOSIS — Z8673 Personal history of transient ischemic attack (TIA), and cerebral infarction without residual deficits: Secondary | ICD-10-CM

## 2024-06-16 DIAGNOSIS — Z9989 Dependence on other enabling machines and devices: Secondary | ICD-10-CM

## 2024-06-16 MED ORDER — MIRTAZAPINE 7.5 MG PO TABS: 7.5000 mg | ORAL_TABLET | Freq: Every day | ORAL | 2 refills | Status: DC

## 2024-06-16 NOTE — Progress Notes (Signed)
 [name], CMA,acting as a Neurosurgeon for Jesus Ada, FNP.,have documented all relevant documentation on the behalf of Jesus Ada, FNP,as directed by  Jesus Ada, FNP while in the presence of Jesus Ada, FNP.  Subjective:  Patient ID: Jesus Lam , male    DOB: 1933/01/03 , 89 y.o.   MRN: [id number]  Chief Complaint  Patient presents with   Dysarthria    Patient presents today for a hospital follow up patient went to the ER on 06/03/2024 and discharged on 06/04/2024. He was diagnosed with Dysarthria. He reports today he is feeling better. He reports he has been having heart burn and SOB. He reports his nose is stopped up which has started the SOB.    Heartburn    HPI Discussed the use of AI scribe software for clinical note transcription with the patient, who gave verbal consent to proceed.  History of Present Illness Jesus Lam is an 89 year old male with a history of stroke and heart failure who presents with confusion and heartburn.  He experienced confusion leading to an emergency room visit on July 10th, 2025, occurring around 10:30 PM, despite appearing normal earlier in the day. He has inadequate hydration, consuming about three bottles of water daily. His daughter notes that his symptoms, such as weakness and fatigue, worsen when he is tired. He has a history of stroke.  He has issues with appetite, often starting meals but unable to finish them. Recently, his appetite has improved, but he still struggles with consistent intake. He previously received an appetite stimulant while in a nursing home, which he no longer takes. His daughter mentions he has hyperthyroidism, managed with methimazole , and has upcoming follow-up appointments with his endocrinologist and cardiologist.  He experiences sleep disturbances, often unable to sleep at night and preferring to sleep in a chair, which his daughter notes causes leg swelling. His hyperthyroidism may contribute to his sleep  issues. He has not seen a neurologist recently but has an appointment scheduled.  He experiences heartburn and occasional shortness of breath, with heartburn occurring more frequently in recent days. He does not regularly take medication for heartburn but found relief with omeprazole provided by his daughter. His diet includes [place] bacon and eggs, and he avoids pork and fatty foods. His daughter notes that he sometimes eats onions and cabbage, which may contribute to his heartburn.  He has a history of heart failure and is advised to limit fluid intake to avoid exacerbating his condition. He drinks decaffeinated coffee and has been staying with his sister recently, which may have affected his routine and symptoms.    Past Medical History:  Diagnosis Date   Anemia    BPH (benign prostatic hyperplasia)    Cataracts, bilateral    CVA (cerebral vascular accident) (HCC)    DVT (deep venous thrombosis) (HCC)    dvt in left leg   Dyslipidemia    Dysuria 06/17/2023   Gout    Gout    HTN (hypertension)    Left hip pain 03/15/2020   Left leg pain    Lower abdominal pain 11/03/2023   Osteoarthritis    PAD (peripheral artery disease) (HCC)    Stasis dermatitis    Stroke (HCC)    Vitamin D  deficiency      Family History  Problem Relation Age of Onset   Cancer Mother    Cancer Father    Alcohol abuse Father    Breast cancer Daughter    Stroke Daughter  CVA Daughter      Current Outpatient Medications:    amLODipine  (NORVASC ) 5 MG tablet, Take 1 tablet (5 mg total) by mouth daily., Disp: 90 tablet, Rfl: 3   apixaban  (ELIQUIS ) 5 MG TABS tablet, Take 1 tablet (5 mg total) by mouth 2 (two) times daily., Disp: 60 tablet, Rfl: 5   atorvastatin  (LIPITOR ) 40 MG tablet, Take 2 tablets (80 mg total) by mouth daily., Disp: 180 tablet, Rfl: 1   azelastine  (ASTELIN ) 0.1 % nasal spray, Place 2 sprays into both nostrils 2 (two) times daily. Use in each nostril as directed, Disp: 30 mL, Rfl: 5    Cholecalciferol  (VITAMIN D3) 5000 units CAPS, Take 5,000 Units by mouth daily., Disp: , Rfl:    diclofenac  Sodium (VOLTAREN ) 1 % GEL, APPLY TWO GRAMS TOPICALLY FOUR TIMES A DAY, Disp: 100 g, Rfl: 1   hydrALAZINE  (APRESOLINE ) 50 MG tablet, TAKE ONE TABLET BY MOUTH THREE TIMES A DAY, Disp: 90 tablet, Rfl: 3   methimazole  (TAPAZOLE ) 10 MG tablet, Take 1 tablet (10 mg total) by mouth daily., Disp: 30 tablet, Rfl: 6   mirtazapine  (REMERON ) 7.5 MG tablet, Take 1 tablet (7.5 mg total) by mouth at bedtime., Disp: 30 tablet, Rfl: 2   Multiple Vitamins-Minerals (COMPLETE SENIOR PO), 1 tablet daily. , Disp: , Rfl:    tamsulosin  (FLOMAX ) 0.4 MG CAPS capsule, TAKE ONE CAPSULE BY MOUTH DAILY HALF HOUR FOLLOWING THE SAME MEAL DAILY (Patient taking differently: Take 0.8 mg by mouth daily. TAKE ONE CAPSULE BY MOUTH DAILY HALF HOUR FOLLOWING THE SAME MEAL DAILY), Disp: 90 capsule, Rfl: 4   vitamin B-12 (CYANOCOBALAMIN ) 500 MCG tablet, Take 500 mcg by mouth daily., Disp: , Rfl:    vitamin C  (ASCORBIC ACID ) 500 MG tablet, Take 500 mg daily by mouth., Disp: , Rfl:    furosemide  (LASIX ) 20 MG tablet, TAKE ONE TABLET BY MOUTH DAILY, Disp: 90 tablet, Rfl: 1   GNP 8 HOUR ARTHRITIS RELIEF 650 MG CR tablet, TAKE ONE TABLET BY MOUTH TWICE A DAY, Disp: 60 tablet, Rfl: 1  Current Facility-Administered Medications:    0.9 %  sodium chloride  infusion, , Intravenous, PRN, Ghumman, Ramandeep, NP   Allergies  Allergen Reactions   Pork-Derived Products Other (See Comments)    Religious    Shellfish Allergy Other (See Comments)    Gout      Review of Systems  Constitutional: Negative.   HENT: Negative.    Respiratory: Negative.    Cardiovascular: Negative.   Gastrointestinal:  Negative for abdominal distention.       Belching  Genitourinary: Negative.   Neurological: Negative.   Psychiatric/Behavioral: Negative.       Today's Vitals   06/16/24 1056  BP: (!) 140/64  Pulse: 74  Temp: 98.8 F (37.1 C)  TempSrc:  Oral  SpO2: 96%  Weight: 244 lb (110.7 kg)  Height: 6' (1.829 m)  PainSc: 0-No pain   Body mass index is 33.09 kg/m.  Wt Readings from Last 3 Encounters:  06/16/24 244 lb (110.7 kg)  06/16/24 244 lb (110.7 kg)  06/03/24 228 lb 6.3 oz (103.6 kg)     Objective:  Physical Exam Vitals and nursing note reviewed.  Constitutional:      General: He is not in acute distress.    Appearance: Normal appearance. He is obese.  Cardiovascular:     Rate and Rhythm: Normal rate and regular rhythm.     Pulses: Normal pulses.     Heart sounds: Normal heart sounds.  No murmur heard. Pulmonary:     Effort: Pulmonary effort is normal. No respiratory distress.     Breath sounds: Normal breath sounds. No wheezing.  Musculoskeletal:        General: No swelling.     Comments: Using cane  Skin:    General: Skin is warm and dry.     Capillary Refill: Capillary refill takes less than 2 seconds.  Neurological:     General: No focal deficit present.     Mental Status: He is alert and oriented to person, place, and time.  Psychiatric:        Mood and Affect: Mood normal.        Behavior: Behavior normal.        Thought Content: Thought content normal.        Judgment: Judgment normal.     Assessment And Plan:  Dysarthria -     Ambulatory referral to Home Health  Subclinical hyperthyroidism Assessment & Plan: Under endocrinologist care, on methimazole . Hyperthyroidism may contribute to sleep disturbances. - Continue methimazole  as prescribed. - Follow up with endocrinologist on October 27th. - Obtain labs on August 6th.  Orders: -     Mirtazapine ; Take 1 tablet (7.5 mg total) by mouth at bedtime.  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    Other insomnia -     Mirtazapine ; Take 1 tablet (7.5 mg total) by  mouth at bedtime.  Dispense: 30 tablet; Refill: 2  Heart burn Assessment & Plan: Recent onset of heartburn, possibly related to dietary habits or medication. Omeprazole was effective recently. - Prescribe omeprazole for two weeks. - Reassess need for continued omeprazole after trial period. - Advise dietary modifications to prevent heartburn.   History of CVA (cerebrovascular accident) -     Ambulatory referral to Home Health  Confusion  Decreased appetite Assessment & Plan: Decreased appetite and difficulty sleeping, possibly related to hyperthyroidism. Plan to trial mirtazapine  for sleep and appetite. - Trial mirtazapine  at a low dose for sleep and appetite. - Monitor weight closely to avoid excessive weight gain.   Need for assistance with personal care Assessment & Plan: Requires assistance with daily activities and medication management. Previous PCS and physical therapy arrangements were inadequate. - Coordinate with referral specialist to arrange PCS and physical therapy. - Follow up with referral specialist if no contact is made by early next week.     Return for uncontrolled bp 4 months check.  Patient was given opportunity to ask questions. Patient verbalized understanding of the plan and was able to repeat key elements of the plan. All questions were answered to their satisfaction.    LILLETTE Jesus Ada, FNP, have reviewed all documentation for this visit. The documentation on 06/16/24 for the exam, diagnosis, procedures, and orders are all accurate and complete.   IF YOU HAVE BEEN REFERRED TO A SPECIALIST, IT MAY TAKE 1-2 WEEKS TO SCHEDULE/PROCESS THE REFERRAL. IF YOU HAVE NOT HEARD FROM US /SPECIALIST IN TWO WEEKS, PLEASE GIVE US  A CALL AT [phone].

## 2024-06-16 NOTE — Patient Instructions (Signed)
 Mr. Carton , Thank you for taking time out of your busy schedule to complete your Annual Wellness Visit with me. I enjoyed our conversation and look forward to speaking with you again next year. I, as well as your care team,  appreciate your ongoing commitment to your health goals. Please review the following plan we discussed and let me know if I can assist you in the future. Your Game plan/ To Do List    Referrals: If you haven't heard from the office you've been referred to, please reach out to them at the phone provided.  N/a Follow up Visits: Next Medicare AWV with our clinical staff: office will schedule   Have you seen your provider in the last 6 months (3 months if uncontrolled diabetes)? Yes Next Office Visit with your provider: 06/16/2024  Clinician Recommendations:  Aim for 30 minutes of exercise or brisk walking, 6-8 glasses of water, and 5 servings of fruits and vegetables each day.       This is a list of the screening recommended for you and due dates:  Health Maintenance  Topic Date Due   COVID-19 Vaccine (5 - 2024-25 season) 07/02/2024*   Flu Shot  06/25/2024   Medicare Annual Wellness Visit  06/16/2025   DTaP/Tdap/Td vaccine (2 - Td or Tdap) 03/26/2032   Pneumococcal Vaccine for age over 33  Completed   Zoster (Shingles) Vaccine  Completed   Hepatitis B Vaccine  Aged Out   HPV Vaccine  Aged Out   Meningitis B Vaccine  Aged Out  *Topic was postponed. The date shown is not the original due date.    Advanced directives: (Copy Requested) Please bring a copy of your health care power of attorney and living will to the office to be added to your chart at your convenience. You can mail to [facility] [address] [place], [place] [address] or email to [email or web address] Advance Care Planning is important because it:  [x]  Makes sure you receive the medical care that is consistent with your values, goals, and preferences  [x]  It provides guidance  to your family and loved ones and reduces their decisional burden about whether or not they are making the right decisions based on your wishes.  Follow the link provided in your after visit summary or read over the paperwork we have mailed to you to help you started getting your Advance Directives in place. If you need assistance in completing these, please reach out to us  so that we can help you!  See attachments for Preventive Care and Fall Prevention Tips.

## 2024-06-16 NOTE — Progress Notes (Signed)
 Subjective:   Jesus Lam is a 89 y.o. who presents for a Medicare Wellness preventive visit.  As a reminder, Annual Wellness Visits don't include a physical exam, and some assessments may be limited, especially if this visit is performed virtually. We may recommend an in-person follow-up visit with your provider if needed.  Visit Complete: In person    Persons Participating in Visit: Patient.  AWV Questionnaire: No: Patient Medicare AWV questionnaire was not completed prior to this visit.  Cardiac Risk Factors include: advanced age (>44men, >70 women);dyslipidemia;hypertension;male gender     Objective:    Today's Vitals   06/16/24 1110  BP: (!) 140/64  Pulse: 74  Temp: 98.8 F (37.1 C)  TempSrc: Oral  SpO2: 96%  Weight: 244 lb (110.7 kg)  Height: 6' (1.829 m)   Body mass index is 33.09 kg/m.     06/16/2024   11:20 AM 06/04/2024   12:39 AM 03/22/2024    3:34 PM 08/06/2023   11:17 AM 06/04/2023   11:48 AM 10/30/2022   12:53 PM 09/05/2022   10:38 AM  Advanced Directives  Does Patient Have a Medical Advance Directive? Yes No No No No Yes No  Type of Estate agent of [place];Living will        Copy of Healthcare Power of Attorney in Chart? No - copy requested        Would patient like information on creating a medical advance directive?   No - Patient declined No - Patient declined No - Patient declined  No - Patient declined    Current Medications (verified) Outpatient Encounter Medications as of 06/16/2024  Medication Sig   amLODipine  (NORVASC ) 5 MG tablet Take 1 tablet (5 mg total) by mouth daily.   apixaban  (ELIQUIS ) 5 MG TABS tablet Take 1 tablet (5 mg total) by mouth 2 (two) times daily.   atorvastatin  (LIPITOR ) 40 MG tablet Take 2 tablets (80 mg total) by mouth daily.   azelastine  (ASTELIN ) 0.1 % nasal spray Place 2 sprays into both nostrils 2 (two) times daily. Use in each nostril as directed   Cholecalciferol  (VITAMIN D3) 5000 units  CAPS Take 5,000 Units by mouth daily.   diclofenac  Sodium (VOLTAREN ) 1 % GEL APPLY TWO GRAMS TOPICALLY FOUR TIMES A DAY   furosemide  (LASIX ) 20 MG tablet Take 20 mg by mouth daily.   furosemide  (LASIX ) 20 MG tablet Take 1 tablet (20 mg total) by mouth as needed for fluid or edema (once a day).   GNP 8 HOUR ARTHRITIS RELIEF 650 MG CR tablet TAKE ONE TABLET BY MOUTH TWICE A DAY   hydrALAZINE  (APRESOLINE ) 50 MG tablet TAKE ONE TABLET BY MOUTH THREE TIMES A DAY   methimazole  (TAPAZOLE ) 10 MG tablet Take 1 tablet (10 mg total) by mouth daily.   Multiple Vitamins-Minerals (COMPLETE SENIOR PO) 1 tablet daily.    tamsulosin  (FLOMAX ) 0.4 MG CAPS capsule TAKE ONE CAPSULE BY MOUTH DAILY HALF HOUR FOLLOWING THE SAME MEAL DAILY (Patient taking differently: Take 0.8 mg by mouth daily. TAKE ONE CAPSULE BY MOUTH DAILY HALF HOUR FOLLOWING THE SAME MEAL DAILY)   vitamin B-12 (CYANOCOBALAMIN ) 500 MCG tablet Take 500 mcg by mouth daily.   vitamin C  (ASCORBIC ACID ) 500 MG tablet Take 500 mg daily by mouth.   Facility-Administered Encounter Medications as of 06/16/2024  Medication   0.9 %  sodium chloride  infusion    Allergies (verified) Pork-derived products and Shellfish allergy   History: Past Medical History:  Diagnosis Date  Anemia    BPH (benign prostatic hyperplasia)    Cataracts, bilateral    CVA (cerebral vascular accident) (HCC)    DVT (deep venous thrombosis) (HCC)    dvt in left leg   Dyslipidemia    Dysuria 06/17/2023   Gout    Gout    HTN (hypertension)    Left hip pain 03/15/2020   Left leg pain    Lower abdominal pain 11/03/2023   Osteoarthritis    PAD (peripheral artery disease) (HCC)    Stasis dermatitis    Stroke [facility])    Vitamin D  deficiency    Past Surgical History:  Procedure Laterality Date   ABDOMINAL AORTOGRAM N/A 01/13/2018   Procedure: ABDOMINAL AORTOGRAM;  Surgeon: Elmira Newman PARAS, MD;  Location: [facility];  Service: Cardiovascular;  Laterality: N/A;    CATARACT EXTRACTION, BILATERAL     LEFT HEART CATH AND CORONARY ANGIOGRAPHY N/A 01/13/2018   Procedure: LEFT HEART CATH AND CORONARY ANGIOGRAPHY;  Surgeon: Elmira Newman PARAS, MD;  Location: [facility];  Service: Cardiovascular;  Laterality: N/A;   LOWER EXTREMITY ANGIOGRAPHY N/A 01/13/2018   Procedure: LOWER EXTREMITY ANGIOGRAPHY;  Surgeon: Elmira Newman PARAS, MD;  Location: [facility];  Service: Cardiovascular;  Laterality: N/A;   LOWER EXTREMITY ANGIOGRAPHY Right 01/27/2018   Procedure: LOWER EXTREMITY ANGIOGRAPHY;  Surgeon: Ladona Heinz, MD;  Location: [facility];  Service: Cardiovascular;  Laterality: Right;   PERIPHERAL VASCULAR ATHERECTOMY Left 01/13/2018   Procedure: PERIPHERAL VASCULAR ATHERECTOMY;  Surgeon: Elmira Newman PARAS, MD;  Location: [facility];  Service: Cardiovascular;  Laterality: Left;  SFA WITH PTA DRUG COATED BALLOON   PERIPHERAL VASCULAR INTERVENTION  01/27/2018   Procedure: PERIPHERAL VASCULAR INTERVENTION;  Surgeon: Ladona Heinz, MD;  Location: [facility];  Service: Cardiovascular;;   Family History  Problem Relation Age of Onset   Cancer Mother    Cancer Father    Alcohol abuse Father    Breast cancer Daughter    Stroke Daughter    CVA Daughter    Social History   Socioeconomic History   Marital status: Single    Spouse name: Not on file   Number of children: 3   Years of education: Not on file   Highest education level: Not on file  Occupational History   Occupation: retired  Tobacco Use   Smoking status: Former    Current packs/day: 0.00    Average packs/day: 0.3 packs/day for 0.5 years (0.1 ttl pk-yrs)    Types: Cigarettes    Start date: 05/1978    Quit date: 1980    Years since quitting: 45.5   Smokeless tobacco: Never  Vaping Use   Vaping status: Never Used  Substance and Sexual Activity   Alcohol use: Not Currently    Alcohol/week: 6.0 - 7.0 standard drinks of alcohol    Types: 6 - 7 Shots of liquor  per week    Comment: on occasion   Drug use: Not Currently    Types: Marijuana    Comment: last used x 2 hours ago   Sexual activity: Yes  Other Topics Concern   Not on file  Social History Narrative   ** Merged History Encounter **       Lives with daughter, [name]. 2 daughters live here in [place]. Son lives in [place]. 3/16- offered information on local food pantries as well as congregate meal sites. Information declined.   Social Drivers of Dispensing optician  Resource Strain: Low Risk  (06/16/2024)   Overall Financial Resource Strain (CARDIA)    Difficulty of Paying Living Expenses: Not hard at all  Food Insecurity: No Food Insecurity (06/16/2024)   Hunger Vital Sign    Worried About Running Out of Food in the Last Year: Never true    Ran Out of Food in the Last Year: Never true  Transportation Needs: No Transportation Needs (06/16/2024)   PRAPARE - Administrator, Civil Service (Medical): No    Lack of Transportation (Non-Medical): No  Physical Activity: Inactive (06/16/2024)   Exercise Vital Sign    Days of Exercise per Week: 0 days    Minutes of Exercise per Session: 0 min  Stress: No Stress Concern Present (06/16/2024)   [employer] of Occupational Health - Occupational Stress Questionnaire    Feeling of Stress: Not at all  Social Connections: Socially Isolated (06/16/2024)   Social Connection and Isolation Panel    Frequency of Communication with Friends and Family: More than three times a week    Frequency of Social Gatherings with Friends and Family: More than three times a week    Attends Religious Services: Never    Database administrator or Organizations: No    Attends Banker Meetings: Never    Marital Status: Widowed    Tobacco Counseling Counseling given: Not Answered    Clinical Intake:  Pre-visit preparation completed: Yes  Pain : No/denies pain     Nutritional Status: BMI > 30  Obese Nutritional Risks:  None Diabetes: No  Lab Results  Component Value Date   HGBA1C 4.8 03/23/2024   HGBA1C 5.4 12/24/2021   HGBA1C 5.1 02/03/2020     How often do you need to have someone help you when you read instructions, pamphlets, or other written materials from your doctor or pharmacy?: 1 - Never  Interpreter Needed?: No  Information entered by :: NAllen LPN   Activities of Daily Living     06/16/2024   11:14 AM 03/22/2024    3:38 PM  In your present state of health, do you have any difficulty performing the following activities:  Hearing? 1   Comment lost hearing aids   Vision? 1   Comment blurry sometimes, sees eye doctor   Difficulty concentrating or making decisions? 1   Walking or climbing stairs? 1   Dressing or bathing? 1   Doing errands, shopping? 1 0  Preparing Food and eating ? Y   Using the Toilet? N   In the past six months, have you accidently leaked urine? Y   Comment sees an urologist   Do you have problems with loss of bowel control? N   Managing your Medications? Y   Managing your Finances? Y   Housekeeping or managing your Housekeeping? Y     Patient Care Team: Georgina Speaks, FNP as PCP - General (General Practice) Elmira Newman PARAS, MD as PCP - Cardiology (Cardiology) Pearson, Vallie J, RPH (Inactive) (Pharmacist) Morgan Clayborne CROME, RN as [facility] Care Management  I have updated your Care Teams any recent Medical Services you may have received from other providers in the past year.     Assessment:   This is a routine wellness examination for Jesus Lam.  Hearing/Vision screen Hearing Screening - Comments:: Lost hearing aids Vision Screening - Comments:: Regular eye exams, [facility]   Goals Addressed             This Visit's Progress  Patient Stated       06/16/2024, wants to start swimming again       Depression Screen     06/16/2024   11:21 AM 08/21/2023    2:13 PM 06/04/2023   11:50 AM 02/14/2022    2:26 PM 02/08/2021    2:28 PM  02/03/2020    2:49 PM 02/03/2020    2:11 PM  PHQ 2/9 Scores  PHQ - 2 Score 1 0 0 0 3 0 0  PHQ- 9 Score  0 4  9 0     Fall Risk     06/16/2024   11:20 AM 08/21/2023    2:13 PM 06/04/2023   11:49 AM 02/14/2022    2:25 PM 02/08/2021    2:27 PM  Fall Risk   Falls in the past year? 1 0 0 0 1  Comment had stroke    tripped over himself  Number falls in past yr: 1 0 0  0  Injury with Fall? 1 0 0  0  Risk for fall due to : Impaired balance/gait;Impaired mobility;Medication side effect No Fall Risks Impaired mobility;Impaired balance/gait;Medication side effect Impaired mobility;Medication side effect Impaired balance/gait;Medication side effect  Follow up Falls prevention discussed;Falls evaluation completed Falls evaluation completed Falls prevention discussed;Falls evaluation completed Falls evaluation completed;Education provided;Falls prevention discussed  Falls evaluation completed;Education provided;Falls prevention discussed      Data saved with a previous flowsheet row definition    MEDICARE RISK AT HOME:  Medicare Risk at Home Any stairs in or around the home?: No If so, are there any without handrails?: No Home free of loose throw rugs in walkways, pet beds, electrical cords, etc?: Yes Adequate lighting in your home to reduce risk of falls?: Yes Life alert?: No Use of a cane, walker or w/c?: Yes Grab bars in the bathroom?: Yes Shower chair or bench in shower?: Yes Elevated toilet seat or a handicapped toilet?: Yes  TIMED UP AND GO:  Was the test performed?  Yes  Length of time to ambulate 10 feet: 7 sec Gait slow and steady with assistive device  Cognitive Function: Impaired: Patient has current diagnosis of cognitive impairment.        06/04/2023   11:52 AM 02/08/2021    2:34 PM 02/03/2020    2:52 PM 01/28/2019    3:29 PM  6CIT Screen  What Year? 4 points 0 points 4 points 0 points  What month? 0 points 0 points 0 points 0 points  What time? 0 points 0 points 0 points 0  points  Count back from 20 0 points 0 points 0 points 4 points  Months in reverse 4 points 4 points 4 points 0 points  Repeat phrase 8 points 6 points 6 points 0 points  Total Score 16 points 10 points 14 points 4 points    Immunizations Immunization History  Administered Date(s) Administered   Fluad Quad(high Dose 65+) 08/03/2020, 08/15/2021   Fluad Trivalent(High Dose 65+) 11/03/2023   Influenza, High Dose Seasonal PF 10/29/2018, 08/11/2019   Moderna SARS-COV2 Booster Vaccination 11/15/2020   PFIZER(Purple Top)SARS-COV-2 Vaccination 01/17/2020, 01/29/2020   PNEUMOCOCCAL CONJUGATE-20 06/04/2023   Pfizer Covid-19 Vaccine Bivalent Booster 36yrs & up 11/09/2021   Pneumococcal Polysaccharide-23 09/18/2021   Tdap 03/26/2022   Unspecified SARS-COV-2 Vaccination 05/04/2021   Zoster Recombinant(Shingrix ) 02/14/2022, 01/20/2023    Screening Tests Health Maintenance  Topic Date Due   COVID-19 Vaccine (5 - 2024-25 season) 07/02/2024 (Originally 07/27/2023)   INFLUENZA VACCINE  06/25/2024  Medicare Annual Wellness (AWV)  06/16/2025   DTaP/Tdap/Td (2 - Td or Tdap) 03/26/2032   Pneumococcal Vaccine: 50+ Years  Completed   Zoster Vaccines- Shingrix   Completed   Hepatitis B Vaccines  Aged Out   HPV VACCINES  Aged Out   Meningococcal B Vaccine  Aged Out    Health Maintenance  There are no preventive care reminders to display for this patient.  Health Maintenance Items Addressed: Up to date  Additional Screening:  Vision Screening: Recommended annual ophthalmology exams for early detection of glaucoma and other disorders of the eye. Would you like a referral to an eye doctor? No    Dental Screening: Recommended annual dental exams for proper oral hygiene  Community Resource Referral / Chronic Care Management: CRR required this visit?  No   CCM required this visit?  No   Plan:    I have personally reviewed and noted the following in the patient's chart:   Medical and social  history Use of alcohol, tobacco or illicit drugs  Current medications and supplements including opioid prescriptions. Patient is not currently taking opioid prescriptions. Functional ability and status Nutritional status Physical activity Advanced directives List of other physicians Hospitalizations, surgeries, and ER visits in previous 12 months Vitals Screenings to include cognitive, depression, and falls Referrals and appointments  In addition, I have reviewed and discussed with patient certain preventive protocols, quality metrics, and best practice recommendations. A written personalized care plan for preventive services as well as general preventive health recommendations were provided to patient.   Jesus FORBES Dawn, LPN   [id number]/76/7974   After Visit Summary: (In Person-Printed) AVS printed and given to the patient  Notes: PCP Follow Up Recommendations: Having ingigestion

## 2024-06-18 ENCOUNTER — Other Ambulatory Visit: Payer: Self-pay | Admitting: Nurse Practitioner

## 2024-06-24 ENCOUNTER — Ambulatory Visit: Payer: Self-pay | Admitting: Nurse Practitioner

## 2024-06-29 ENCOUNTER — Other Ambulatory Visit: Payer: Self-pay | Admitting: Nurse Practitioner

## 2024-06-29 DIAGNOSIS — M19041 Primary osteoarthritis, right hand: Secondary | ICD-10-CM

## 2024-06-30 ENCOUNTER — Ambulatory Visit: Payer: Self-pay | Admitting: Cardiology

## 2024-06-30 ENCOUNTER — Other Ambulatory Visit

## 2024-06-30 DIAGNOSIS — I455 Other specified heart block: Secondary | ICD-10-CM

## 2024-06-30 DIAGNOSIS — R630 Anorexia: Secondary | ICD-10-CM | POA: Insufficient documentation

## 2024-06-30 DIAGNOSIS — E042 Nontoxic multinodular goiter: Secondary | ICD-10-CM | POA: Diagnosis not present

## 2024-06-30 DIAGNOSIS — R410 Disorientation, unspecified: Secondary | ICD-10-CM | POA: Insufficient documentation

## 2024-06-30 DIAGNOSIS — I639 Cerebral infarction, unspecified: Secondary | ICD-10-CM | POA: Diagnosis not present

## 2024-06-30 DIAGNOSIS — N1831 Chronic kidney disease, stage 3a: Secondary | ICD-10-CM | POA: Insufficient documentation

## 2024-06-30 DIAGNOSIS — G4709 Other insomnia: Secondary | ICD-10-CM | POA: Insufficient documentation

## 2024-06-30 DIAGNOSIS — Z741 Need for assistance with personal care: Secondary | ICD-10-CM | POA: Insufficient documentation

## 2024-06-30 DIAGNOSIS — R001 Bradycardia, unspecified: Secondary | ICD-10-CM

## 2024-06-30 NOTE — Assessment & Plan Note (Signed)
 Recent onset of heartburn, possibly related to dietary habits or medication. Omeprazole was effective recently. - Prescribe omeprazole for two weeks. - Reassess need for continued omeprazole after trial period. - Advise dietary modifications to prevent heartburn.

## 2024-06-30 NOTE — Assessment & Plan Note (Signed)
Blood pressure is fairly controlled, continue current medications.  

## 2024-06-30 NOTE — Assessment & Plan Note (Signed)
 Requires assistance with daily activities and medication management. Previous PCS and physical therapy arrangements were inadequate. - Coordinate with referral specialist to arrange PCS and physical therapy. - Follow up with referral specialist if no contact is made by early next week.

## 2024-06-30 NOTE — Assessment & Plan Note (Signed)
 Confusion episode likely due to dehydration and fatigue, exacerbating post-stroke symptoms. Stroke history increases risk of recurrent strokes. Maintaining hydration and rest is crucial. - Encourage hydration with water, avoid diuretics like tea and coffee. - Ensure adequate rest to prevent fatigue-related symptoms.

## 2024-06-30 NOTE — Assessment & Plan Note (Signed)
>>  ASSESSMENT AND PLAN FOR SUBCLINICAL HYPERTHYROIDISM WRITTEN ON 06/30/2024  5:35 PM BY GEORGINA SPEAKS, FNP  Under endocrinologist care, on methimazole . Hyperthyroidism may contribute to sleep disturbances. - Continue methimazole  as prescribed. - Follow up with endocrinologist on October 27th. - Obtain labs on August 6th.

## 2024-06-30 NOTE — Assessment & Plan Note (Signed)
 Under endocrinologist care, on methimazole . Hyperthyroidism may contribute to sleep disturbances. - Continue methimazole  as prescribed. - Follow up with endocrinologist on October 27th. - Obtain labs on August 6th.

## 2024-06-30 NOTE — Assessment & Plan Note (Signed)
 Decreased appetite and difficulty sleeping, possibly related to hyperthyroidism. Plan to trial mirtazapine  for sleep and appetite. - Trial mirtazapine  at a low dose for sleep and appetite. - Monitor weight closely to avoid excessive weight gain.

## 2024-07-01 ENCOUNTER — Ambulatory Visit: Payer: Self-pay | Admitting: Internal Medicine

## 2024-07-01 ENCOUNTER — Encounter: Payer: Self-pay | Admitting: Nurse Practitioner

## 2024-07-01 LAB — T4, FREE: Free T4: 1.2 ng/dL (ref 0.8–1.8)

## 2024-07-01 LAB — T3, FREE: T3, Free: 3.2 pg/mL (ref 2.3–4.2)

## 2024-07-01 LAB — TSH: TSH: 0.28 m[IU]/L — ABNORMAL LOW (ref 0.40–4.50)

## 2024-07-10 DIAGNOSIS — I69351 Hemiplegia and hemiparesis following cerebral infarction affecting right dominant side: Secondary | ICD-10-CM | POA: Diagnosis not present

## 2024-07-10 DIAGNOSIS — I69322 Dysarthria following cerebral infarction: Secondary | ICD-10-CM | POA: Diagnosis not present

## 2024-07-10 DIAGNOSIS — I509 Heart failure, unspecified: Secondary | ICD-10-CM | POA: Diagnosis not present

## 2024-07-10 DIAGNOSIS — E0590 Thyrotoxicosis, unspecified without thyrotoxic crisis or storm: Secondary | ICD-10-CM | POA: Diagnosis not present

## 2024-07-10 DIAGNOSIS — D631 Anemia in chronic kidney disease: Secondary | ICD-10-CM | POA: Diagnosis not present

## 2024-07-10 DIAGNOSIS — G4709 Other insomnia: Secondary | ICD-10-CM | POA: Diagnosis not present

## 2024-07-10 DIAGNOSIS — I130 Hypertensive heart and chronic kidney disease with heart failure and stage 1 through stage 4 chronic kidney disease, or unspecified chronic kidney disease: Secondary | ICD-10-CM | POA: Diagnosis not present

## 2024-07-10 DIAGNOSIS — N1831 Chronic kidney disease, stage 3a: Secondary | ICD-10-CM | POA: Diagnosis not present

## 2024-07-10 DIAGNOSIS — M1030 Gout due to renal impairment, unspecified site: Secondary | ICD-10-CM | POA: Diagnosis not present

## 2024-07-11 DIAGNOSIS — E0590 Thyrotoxicosis, unspecified without thyrotoxic crisis or storm: Secondary | ICD-10-CM | POA: Diagnosis not present

## 2024-07-11 DIAGNOSIS — I69322 Dysarthria following cerebral infarction: Secondary | ICD-10-CM | POA: Diagnosis not present

## 2024-07-11 DIAGNOSIS — G4709 Other insomnia: Secondary | ICD-10-CM | POA: Diagnosis not present

## 2024-07-11 DIAGNOSIS — D631 Anemia in chronic kidney disease: Secondary | ICD-10-CM | POA: Diagnosis not present

## 2024-07-11 DIAGNOSIS — I69351 Hemiplegia and hemiparesis following cerebral infarction affecting right dominant side: Secondary | ICD-10-CM | POA: Diagnosis not present

## 2024-07-11 DIAGNOSIS — M1030 Gout due to renal impairment, unspecified site: Secondary | ICD-10-CM | POA: Diagnosis not present

## 2024-07-11 DIAGNOSIS — I130 Hypertensive heart and chronic kidney disease with heart failure and stage 1 through stage 4 chronic kidney disease, or unspecified chronic kidney disease: Secondary | ICD-10-CM | POA: Diagnosis not present

## 2024-07-11 DIAGNOSIS — N1831 Chronic kidney disease, stage 3a: Secondary | ICD-10-CM | POA: Diagnosis not present

## 2024-07-11 DIAGNOSIS — I509 Heart failure, unspecified: Secondary | ICD-10-CM | POA: Diagnosis not present

## 2024-07-13 ENCOUNTER — Ambulatory Visit: Attending: Cardiology | Admitting: Cardiology

## 2024-07-13 ENCOUNTER — Encounter: Payer: Self-pay | Admitting: Cardiology

## 2024-07-13 VITALS — BP 126/68 | HR 79 | Ht 72.0 in | Wt 242.0 lb

## 2024-07-13 DIAGNOSIS — I5020 Unspecified systolic (congestive) heart failure: Secondary | ICD-10-CM

## 2024-07-13 DIAGNOSIS — I10 Essential (primary) hypertension: Secondary | ICD-10-CM

## 2024-07-13 DIAGNOSIS — I483 Typical atrial flutter: Secondary | ICD-10-CM

## 2024-07-13 DIAGNOSIS — R072 Precordial pain: Secondary | ICD-10-CM | POA: Diagnosis not present

## 2024-07-13 NOTE — Progress Notes (Unsigned)
 Cardiology Office Note:  .   Date:  07/13/2024  ID:  Jesus Lam, DOB 08/22/1933, MRN [id number] PCP: Jesus Speaks, FNP  [facility] Providers Cardiologist:  Jesus Lawrence, MD PCP: Jesus Speaks, FNP  Chief Complaint  Patient presents with   Fatigue      History of Present Illness: .    Jesus Lam is a 89 y.o. male with hypertension, hyperlipidemia, PAD, h/o stroke, HFrEF  Patient was seen in the emergency room on 06/04/2024 with acute worsening of his postop aphasia and dysarthria.  He was evaluated by neurologist Dr. Merrianne. He felt that acute worsening of pre-existing deficits from recent left parietal frontal ischemic stroke was due to intercurrent illness, versus possible new stroke.  He recommended MRI brain, which showed no new acute intracranial abnormality. ***  There were no vitals filed for this visit.    ROS:  Review of Systems  Cardiovascular:  Negative for chest pain, dyspnea on exertion, leg swelling, palpitations and syncope.  Musculoskeletal:        Left leg pain      Studies Reviewed: Jesus Lam       EKG 09/22/2023: Sinus bradycardia with 1st degree A-V block Right bundle branch block Left anterior fascicular block Bifascicular block Minimal voltage criteria for LVH, may be normal variant ( R in aVL ) Septal infarct , age undetermined When compared with ECG of 06-Aug-2023 12:04, No significant change since  Echocardiogram 02/2024: 1. Left ventricular ejection fraction, by estimation, is 45 to 50%. The  left ventricle has mildly decreased function. The left ventricle  demonstrates global hypokinesis. There is moderate concentric left  ventricular hypertrophy. Left ventricular  diastolic parameters are consistent with Grade I diastolic dysfunction  (impaired relaxation).   2. Right ventricular systolic function is normal. The right ventricular  size is normal. There is normal pulmonary artery systolic pressure.   3. Left atrial size  was moderately dilated.   4. The mitral valve is normal in structure. Trivial mitral valve  regurgitation. No evidence of mitral stenosis.   5. The aortic valve is calcified. There is mild calcification of the  aortic valve. There is mild thickening of the aortic valve. Aortic valve  regurgitation is mild to moderate. Mild aortic valve stenosis. Aortic  valve area, by VTI measures 1.41 cm.  Aortic valve mean gradient measures 12.0 mmHg. Aortic valve Vmax measures  2.51 m/s.   6. Aortic dilatation noted. There is mild dilatation of the ascending  aorta, measuring 41 mm.   7. The inferior vena cava is normal in size with greater than 50%  respiratory variability, suggesting right atrial pressure of 3 mmHg.     Independently interpreted 4-06/2024: Chol 94, TG 112, HDL 23, LDL 49 HbA1C 4.8% Hb 12.6 Cr 1.4, eGFR 51 TSH 0.28, free T4 1.2   Labs  09/17/2023: eGFR 59 NT proBNP 213 LDL 42, triglycerides 133 (05/2023)   Physical Exam:   Physical Exam Vitals and nursing note reviewed.  Constitutional:      General: He is not in acute distress. Neck:     Vascular: No JVD.  Cardiovascular:     Rate and Rhythm: Normal rate and regular rhythm.     Pulses: Decreased pulses.     Heart sounds: Normal heart sounds. No murmur heard. Pulmonary:     Effort: Pulmonary effort is normal.     Breath sounds: Normal breath sounds. No wheezing or rales.  Musculoskeletal:        General: Swelling (Left  leg swelling and calf tenderness) present.     Right lower leg: Edema (Trace) present.     Left lower leg: Edema (1+) present.      VISIT DIAGNOSES: No diagnosis found.    ASSESSMENT AND PLAN: .    Jesus Lam is a 89 y.o. male with hypertension, hyperlipidemia, PAD, h/o stroke, HFrEF  ***  Left leg pain: Mild calf tenderness, increased warmth and swelling. Suspect DVT. Will obtain urgent ultrasound study. While he does have PAD, I do not see any resting leg ischemia, he does  not have any claudication symptoms.  Therefore, my suspicion for PAD being the cause of his leg pain is low.   Hypertension: Controlled  Mixed hyperlipidemia: Well controlled   F/u in 3 months   Signed, Jesus JINNY Lawrence, MD

## 2024-07-13 NOTE — Patient Instructions (Signed)
 Lab Work: CMP CBC PROBNP  If you have labs (blood work) drawn today and your tests are completely normal, you will receive your results only by: MyChart Message (if you have MyChart) OR A paper copy in the mail If you have any lab test that is abnormal or we need to change your treatment, we will call you to review the results.  Testing/Procedures: [facility]  Your physician has requested that you have a lexiscan myoview. For further information please visit [email or web address] Please follow instruction sheet, as given.   ECHO (IN 08/2024 BEFORE APPT WITH DR. PATWARDHAN)  Your physician has requested that you have an echocardiogram. Echocardiography is a painless test that uses sound waves to create images of your heart. It provides your doctor with information about the size and shape of your heart and how well your heart's chambers and valves are working. This procedure takes approximately one hour. There are no restrictions for this procedure. Please do NOT wear cologne, perfume, aftershave, or lotions (deodorant is allowed). Please arrive 15 minutes prior to your appointment time.  Please note: We ask at that you not bring children with you during ultrasound (echo/ vascular) testing. Due to room size and safety concerns, children are not allowed in the ultrasound rooms during exams. Our front office staff cannot provide observation of children in our lobby area while testing is being conducted. An adult accompanying a patient to their appointment will only be allowed in the ultrasound room at the discretion of the ultrasound technician under special circumstances. We apologize for any inconvenience.  TEE/ CARDIOVERSION IN 07/2024  Your physician has requested that you have a TEE/Cardioversion. During a TEE, sound waves are used to create images of your heart. It provides your doctor with information about the size and shape of your heart and how well your heart's chambers and valves are  working. In this test, a transducer is attached to the end of a flexible tube that is guided down you throat and into your esophagus (the tube leading from your mouth to your stomach) to get a more detailed image of your heart. Once the TEE has determined that a blood clot is not present, the cardioversion begins. Electrical Cardioversion uses a jolt of electricity to your heart either through paddles or wired patches attached to your chest. This is a controlled, usually prescheduled, procedure. This procedure is done at the hospital and you are not awake during the procedure. You usually go home the day of the procedure. Please see the instruction sheet given to you today for more information.    Follow-Up: At [facility], you and your health needs are our priority.  As part of our continuing mission to provide you with exceptional heart care, our providers are all part of one team.  This team includes your primary Cardiologist (physician) and Advanced Practice Providers or APPs (Physician Assistants and Nurse Practitioners) who all work together to provide you with the care you need, when you need it.  REFERRAL TO ELECTROPHYSIOLOGY    Your next appointment:   08/2024  Provider:   Newman JINNY Lawrence, MD    We recommend signing up for the patient portal called MyChart.  Sign up information is provided on this After Visit Summary.  MyChart is used to connect with patients for Virtual Visits (Telemedicine).  Patients are able to view lab/test results, encounter notes, upcoming appointments, etc.  Non-urgent messages can be sent to your provider as well.   To learn more about  what you can do with MyChart, go to [email or web address].   Other Instructions    Dear Jesus Lam  You are scheduled for a TEE (Transesophageal Echocardiogram) Guided Cardioversion on Wednesday, September 3 with Dr. Jeffrie.  Please arrive at the [facility] (Main Entrance A) at [facility]: [address] [place], [place] [address] at 10:30 AM (This time is 1 hour(s) before your procedure to ensure your preparation).   Free valet parking service is available. You will check in at ADMITTING.   *Please Note: You will receive a call the day before your procedure to confirm the appointment time. That time may have changed from the original time based on the schedule for that day.*   DIET:  Nothing to eat or drink after midnight except a sip of water with medications (see medication instructions below)  MEDICATION INSTRUCTIONS: !!IF ANY NEW MEDICATIONS ARE STARTED AFTER TODAY, PLEASE NOTIFY YOUR PROVIDER AS SOON AS POSSIBLE!!  FYI: Medications such as Semaglutide (Ozempic, [place]), Tirzepatide (Mounjaro, Zepbound), Dulaglutide (Trulicity), etc (GLP1 agonists) AND Canagliflozin (Invokana), Dapagliflozin (Farxiga), Empagliflozin (Jardiance), Ertugliflozin (Steglatro), Bexagliflozin [employer]) or any combination with one of these drugs such as Invokamet (Canagliflozin/Metformin), Synjardy (Empagliflozin/Metformin), etc (SGLT2 inhibitors) must be held around the time of a procedure. This is not a comprehensive list of all of these drugs. Please review all of your medications and talk to your provider if you take any one of these. If you are not sure, ask your provider.   Continue taking your anticoagulant (blood thinner): Apixaban  (Eliquis ).  You will need to continue this after your procedure until you are told by your provider that it is safe to stop.    LABS: TODAY  FYI:  For your safety, and to allow us  to monitor your vital signs accurately during the surgery/procedure we request: If you have artificial nails, gel coating, SNS etc, please have those removed prior to your surgery/procedure. Not having the nail coverings /polish removed may result in cancellation or delay of your surgery/procedure.  Your support person will be asked to wait in the waiting room during your procedure.  It  is OK to have someone drop you off and come back when you are ready to be discharged.  You cannot drive after the procedure and will need someone to drive you home.  Bring your insurance cards.  *Special Note: Every effort is made to have your procedure done on time. Occasionally there are emergencies that occur at the hospital that may cause delays. Please be patient if a delay does occur.

## 2024-07-14 ENCOUNTER — Encounter: Payer: Self-pay | Admitting: Cardiology

## 2024-07-14 DIAGNOSIS — R072 Precordial pain: Secondary | ICD-10-CM | POA: Insufficient documentation

## 2024-07-14 DIAGNOSIS — I483 Typical atrial flutter: Secondary | ICD-10-CM | POA: Insufficient documentation

## 2024-07-14 DIAGNOSIS — I5020 Unspecified systolic (congestive) heart failure: Secondary | ICD-10-CM | POA: Insufficient documentation

## 2024-07-20 DIAGNOSIS — N1831 Chronic kidney disease, stage 3a: Secondary | ICD-10-CM | POA: Diagnosis not present

## 2024-07-20 DIAGNOSIS — I69322 Dysarthria following cerebral infarction: Secondary | ICD-10-CM | POA: Diagnosis not present

## 2024-07-20 DIAGNOSIS — E0590 Thyrotoxicosis, unspecified without thyrotoxic crisis or storm: Secondary | ICD-10-CM | POA: Diagnosis not present

## 2024-07-20 DIAGNOSIS — I69351 Hemiplegia and hemiparesis following cerebral infarction affecting right dominant side: Secondary | ICD-10-CM | POA: Diagnosis not present

## 2024-07-20 DIAGNOSIS — G4709 Other insomnia: Secondary | ICD-10-CM | POA: Diagnosis not present

## 2024-07-20 DIAGNOSIS — I130 Hypertensive heart and chronic kidney disease with heart failure and stage 1 through stage 4 chronic kidney disease, or unspecified chronic kidney disease: Secondary | ICD-10-CM | POA: Diagnosis not present

## 2024-07-20 DIAGNOSIS — D631 Anemia in chronic kidney disease: Secondary | ICD-10-CM | POA: Diagnosis not present

## 2024-07-20 DIAGNOSIS — M1030 Gout due to renal impairment, unspecified site: Secondary | ICD-10-CM | POA: Diagnosis not present

## 2024-07-20 DIAGNOSIS — I509 Heart failure, unspecified: Secondary | ICD-10-CM | POA: Diagnosis not present

## 2024-07-21 DIAGNOSIS — I130 Hypertensive heart and chronic kidney disease with heart failure and stage 1 through stage 4 chronic kidney disease, or unspecified chronic kidney disease: Secondary | ICD-10-CM | POA: Diagnosis not present

## 2024-07-21 DIAGNOSIS — E0590 Thyrotoxicosis, unspecified without thyrotoxic crisis or storm: Secondary | ICD-10-CM | POA: Diagnosis not present

## 2024-07-21 DIAGNOSIS — G4709 Other insomnia: Secondary | ICD-10-CM | POA: Diagnosis not present

## 2024-07-21 DIAGNOSIS — N1831 Chronic kidney disease, stage 3a: Secondary | ICD-10-CM | POA: Diagnosis not present

## 2024-07-21 DIAGNOSIS — D631 Anemia in chronic kidney disease: Secondary | ICD-10-CM | POA: Diagnosis not present

## 2024-07-21 DIAGNOSIS — I69322 Dysarthria following cerebral infarction: Secondary | ICD-10-CM | POA: Diagnosis not present

## 2024-07-21 DIAGNOSIS — M1030 Gout due to renal impairment, unspecified site: Secondary | ICD-10-CM | POA: Diagnosis not present

## 2024-07-21 DIAGNOSIS — I69351 Hemiplegia and hemiparesis following cerebral infarction affecting right dominant side: Secondary | ICD-10-CM | POA: Diagnosis not present

## 2024-07-21 DIAGNOSIS — I509 Heart failure, unspecified: Secondary | ICD-10-CM | POA: Diagnosis not present

## 2024-07-27 ENCOUNTER — Telehealth: Payer: Self-pay | Admitting: Cardiology

## 2024-07-27 DIAGNOSIS — I130 Hypertensive heart and chronic kidney disease with heart failure and stage 1 through stage 4 chronic kidney disease, or unspecified chronic kidney disease: Secondary | ICD-10-CM | POA: Diagnosis not present

## 2024-07-27 DIAGNOSIS — I69351 Hemiplegia and hemiparesis following cerebral infarction affecting right dominant side: Secondary | ICD-10-CM | POA: Diagnosis not present

## 2024-07-27 DIAGNOSIS — M1030 Gout due to renal impairment, unspecified site: Secondary | ICD-10-CM | POA: Diagnosis not present

## 2024-07-27 DIAGNOSIS — N1831 Chronic kidney disease, stage 3a: Secondary | ICD-10-CM | POA: Diagnosis not present

## 2024-07-27 DIAGNOSIS — G4709 Other insomnia: Secondary | ICD-10-CM | POA: Diagnosis not present

## 2024-07-27 DIAGNOSIS — I69322 Dysarthria following cerebral infarction: Secondary | ICD-10-CM | POA: Diagnosis not present

## 2024-07-27 DIAGNOSIS — E0590 Thyrotoxicosis, unspecified without thyrotoxic crisis or storm: Secondary | ICD-10-CM | POA: Diagnosis not present

## 2024-07-27 DIAGNOSIS — I509 Heart failure, unspecified: Secondary | ICD-10-CM | POA: Diagnosis not present

## 2024-07-27 DIAGNOSIS — D631 Anemia in chronic kidney disease: Secondary | ICD-10-CM | POA: Diagnosis not present

## 2024-07-27 NOTE — Telephone Encounter (Signed)
  Patient daughter called, she said patient is moving this week and need to reschedule cardioversion

## 2024-07-27 NOTE — Telephone Encounter (Signed)
 Patient's daughter [name] requested TEE/DCCV be rescheduled for the end of the month due to them moving this week.  TEE/DCCV rescheduled from 9/3 to 9/22. Instructions sent to patient via MyChart. Reminded [name] patient will need to have labs drawn prior to procedure, she can take him to any [facility] to have these done. [name] verbalized understanding.

## 2024-07-28 ENCOUNTER — Institutional Professional Consult (permissible substitution): Admitting: Cardiovascular Disease

## 2024-07-28 DIAGNOSIS — I69322 Dysarthria following cerebral infarction: Secondary | ICD-10-CM | POA: Diagnosis not present

## 2024-07-28 DIAGNOSIS — I130 Hypertensive heart and chronic kidney disease with heart failure and stage 1 through stage 4 chronic kidney disease, or unspecified chronic kidney disease: Secondary | ICD-10-CM | POA: Diagnosis not present

## 2024-07-28 DIAGNOSIS — N1831 Chronic kidney disease, stage 3a: Secondary | ICD-10-CM | POA: Diagnosis not present

## 2024-07-28 DIAGNOSIS — M1030 Gout due to renal impairment, unspecified site: Secondary | ICD-10-CM | POA: Diagnosis not present

## 2024-07-28 DIAGNOSIS — D631 Anemia in chronic kidney disease: Secondary | ICD-10-CM | POA: Diagnosis not present

## 2024-07-28 DIAGNOSIS — G4709 Other insomnia: Secondary | ICD-10-CM | POA: Diagnosis not present

## 2024-07-28 DIAGNOSIS — I483 Typical atrial flutter: Secondary | ICD-10-CM

## 2024-07-28 DIAGNOSIS — I69351 Hemiplegia and hemiparesis following cerebral infarction affecting right dominant side: Secondary | ICD-10-CM | POA: Diagnosis not present

## 2024-07-28 DIAGNOSIS — I509 Heart failure, unspecified: Secondary | ICD-10-CM | POA: Diagnosis not present

## 2024-07-28 DIAGNOSIS — E0590 Thyrotoxicosis, unspecified without thyrotoxic crisis or storm: Secondary | ICD-10-CM | POA: Diagnosis not present

## 2024-07-30 ENCOUNTER — Ambulatory Visit: Admitting: Nurse Practitioner

## 2024-07-30 DIAGNOSIS — M1030 Gout due to renal impairment, unspecified site: Secondary | ICD-10-CM | POA: Diagnosis not present

## 2024-07-30 DIAGNOSIS — G4709 Other insomnia: Secondary | ICD-10-CM | POA: Diagnosis not present

## 2024-07-30 DIAGNOSIS — D631 Anemia in chronic kidney disease: Secondary | ICD-10-CM | POA: Diagnosis not present

## 2024-07-30 DIAGNOSIS — I69322 Dysarthria following cerebral infarction: Secondary | ICD-10-CM | POA: Diagnosis not present

## 2024-07-30 DIAGNOSIS — I509 Heart failure, unspecified: Secondary | ICD-10-CM | POA: Diagnosis not present

## 2024-07-30 DIAGNOSIS — E0590 Thyrotoxicosis, unspecified without thyrotoxic crisis or storm: Secondary | ICD-10-CM | POA: Diagnosis not present

## 2024-07-30 DIAGNOSIS — I69351 Hemiplegia and hemiparesis following cerebral infarction affecting right dominant side: Secondary | ICD-10-CM | POA: Diagnosis not present

## 2024-07-30 DIAGNOSIS — I130 Hypertensive heart and chronic kidney disease with heart failure and stage 1 through stage 4 chronic kidney disease, or unspecified chronic kidney disease: Secondary | ICD-10-CM | POA: Diagnosis not present

## 2024-07-30 DIAGNOSIS — N1831 Chronic kidney disease, stage 3a: Secondary | ICD-10-CM | POA: Diagnosis not present

## 2024-08-02 ENCOUNTER — Telehealth: Payer: Self-pay

## 2024-08-02 NOTE — Telephone Encounter (Signed)
 Copied from CRM [id number]. Topic: General - Other >> Aug 02, 2024  3:28 PM Selinda RAMAN wrote: Reason for CRM: Katelynn calling from [facility] is calling to see if an order was received that has been sent twice. One on 8/28 and the other 9/07. She needs these orders signed buy the provider and include the order numbers of #[id number] and [phone]. Please assist further and if not received please contact Katelyn at [phone].   LVM FOR KATELYN- CM,MA

## 2024-08-03 DIAGNOSIS — M1030 Gout due to renal impairment, unspecified site: Secondary | ICD-10-CM | POA: Diagnosis not present

## 2024-08-03 DIAGNOSIS — D631 Anemia in chronic kidney disease: Secondary | ICD-10-CM | POA: Diagnosis not present

## 2024-08-03 DIAGNOSIS — I69322 Dysarthria following cerebral infarction: Secondary | ICD-10-CM | POA: Diagnosis not present

## 2024-08-03 DIAGNOSIS — I69351 Hemiplegia and hemiparesis following cerebral infarction affecting right dominant side: Secondary | ICD-10-CM | POA: Diagnosis not present

## 2024-08-03 DIAGNOSIS — N1831 Chronic kidney disease, stage 3a: Secondary | ICD-10-CM | POA: Diagnosis not present

## 2024-08-03 DIAGNOSIS — I130 Hypertensive heart and chronic kidney disease with heart failure and stage 1 through stage 4 chronic kidney disease, or unspecified chronic kidney disease: Secondary | ICD-10-CM | POA: Diagnosis not present

## 2024-08-03 DIAGNOSIS — G4709 Other insomnia: Secondary | ICD-10-CM | POA: Diagnosis not present

## 2024-08-03 DIAGNOSIS — I509 Heart failure, unspecified: Secondary | ICD-10-CM | POA: Diagnosis not present

## 2024-08-03 DIAGNOSIS — E0590 Thyrotoxicosis, unspecified without thyrotoxic crisis or storm: Secondary | ICD-10-CM | POA: Diagnosis not present

## 2024-08-04 ENCOUNTER — Ambulatory Visit: Payer: Self-pay

## 2024-08-04 DIAGNOSIS — I69351 Hemiplegia and hemiparesis following cerebral infarction affecting right dominant side: Secondary | ICD-10-CM | POA: Diagnosis not present

## 2024-08-04 DIAGNOSIS — D631 Anemia in chronic kidney disease: Secondary | ICD-10-CM | POA: Diagnosis not present

## 2024-08-04 DIAGNOSIS — N1831 Chronic kidney disease, stage 3a: Secondary | ICD-10-CM | POA: Diagnosis not present

## 2024-08-04 DIAGNOSIS — E0590 Thyrotoxicosis, unspecified without thyrotoxic crisis or storm: Secondary | ICD-10-CM | POA: Diagnosis not present

## 2024-08-04 DIAGNOSIS — M1030 Gout due to renal impairment, unspecified site: Secondary | ICD-10-CM | POA: Diagnosis not present

## 2024-08-04 DIAGNOSIS — I130 Hypertensive heart and chronic kidney disease with heart failure and stage 1 through stage 4 chronic kidney disease, or unspecified chronic kidney disease: Secondary | ICD-10-CM | POA: Diagnosis not present

## 2024-08-04 DIAGNOSIS — I69322 Dysarthria following cerebral infarction: Secondary | ICD-10-CM | POA: Diagnosis not present

## 2024-08-04 DIAGNOSIS — I509 Heart failure, unspecified: Secondary | ICD-10-CM | POA: Diagnosis not present

## 2024-08-04 DIAGNOSIS — G4709 Other insomnia: Secondary | ICD-10-CM | POA: Diagnosis not present

## 2024-08-04 NOTE — Telephone Encounter (Signed)
 FYI Only or Action Required?: Action required by provider: Refusing disposition, requesting appt with PCP only.  Patient was last seen in primary care on 06/16/2024 by Georgina Speaks, FNP.  Called Nurse Triage reporting Weight Gain, Leg Swelling, and left leg more swollen than right leg.  Symptoms began within the past week.  Interventions attempted: Prescription medications: furosemide .  Symptoms are: gradually worsening.  Triage Disposition: See HCP Within 4 Hours (Or PCP Triage)  Patient/caregiver understands and will follow disposition?: No, wishes to speak with PCP     Copied from CRM #[id number]. Topic: Clinical - Medical Advice >> Aug 04, 2024  1:19 PM Gustabo D wrote: Lindajo calling from [facility]- Reporting the pt gained 7 lbs since last week. Has swelling in both legs. Shortness of breath and she wants to know if his daughter can be called. Reason for Disposition  [1] Thigh, calf, or ankle swelling AND [2] bilateral AND [3] 1 side is more swollen  Answer Assessment - Initial Assessment Questions Ida A. RN with [facility]  1. ONSET: When did the swelling start? (e.g., minutes, hours, days)     Worse since last week 2. LOCATION: What part of the leg is swollen?  Are both legs swollen or just one leg?     Both legs swelling, 245.4 lb last week 254.2 lbs, so over 8 lbs, had edema last time, wearing compression socks 3. SEVERITY: How bad is the swelling? (e.g., localized; mild, moderate, severe)     1+ in right leg, 2+ in left leg, to midcalf for both legs 4. REDNESS: Is there redness or signs of infection?     No redness, signs infection, warmth 5. PAIN: Is the swelling painful to touch? If Yes, ask: How painful is it?   (Scale 1-10; mild, moderate or severe)     No pain 6. FEVER: Do you have a fever? If Yes, ask: What is it, how was it measured, and when did it start?      no 8. MEDICAL HISTORY: Do you have a history of blood clots  (e.g., DVT), cancer, heart failure, kidney disease, or liver failure?     CHF 10. OTHER SYMPTOMS: Do you have any other symptoms? (e.g., chest pain, difficulty breathing)       SOB been having it, nothing new for him, pulse ox 95% normal for him, no more trouble than usual Numbness in both hands, for a while now, comes and goes, can't tell when, not worse lately No chest pain Takes furosemide  for CHF but not sure if daughter gave dose or not yesterday   Called daughter [name] per home health nurse, informed daughter of disposition Unable to bring pt in next 4 hours Declined [facility], only wants his PCP Speaks    This nurse advised pt be examined in next 4 hours, home health nurse advised this nurse to call pt daughter [name] to schedule. This nurse called pt daughter and informed daughter of disposition, daughter refusing at this time, requesting pt see PCP only. Advised that sending message to PCP office for call back with next steps/earlier appt options. Advised call back if worsening or new symptoms.  Protocols used: Leg Swelling and Edema-A-AH, Heart Failure on Treatment Follow-up Call-A-AH

## 2024-08-09 ENCOUNTER — Ambulatory Visit: Payer: Self-pay | Admitting: *Deleted

## 2024-08-09 ENCOUNTER — Ambulatory Visit: Payer: Self-pay | Admitting: Nurse Practitioner

## 2024-08-09 ENCOUNTER — Ambulatory Visit: Payer: Self-pay

## 2024-08-09 DIAGNOSIS — E0590 Thyrotoxicosis, unspecified without thyrotoxic crisis or storm: Secondary | ICD-10-CM | POA: Diagnosis not present

## 2024-08-09 DIAGNOSIS — M1030 Gout due to renal impairment, unspecified site: Secondary | ICD-10-CM | POA: Diagnosis not present

## 2024-08-09 DIAGNOSIS — N1831 Chronic kidney disease, stage 3a: Secondary | ICD-10-CM | POA: Diagnosis not present

## 2024-08-09 DIAGNOSIS — I69351 Hemiplegia and hemiparesis following cerebral infarction affecting right dominant side: Secondary | ICD-10-CM | POA: Diagnosis not present

## 2024-08-09 DIAGNOSIS — I130 Hypertensive heart and chronic kidney disease with heart failure and stage 1 through stage 4 chronic kidney disease, or unspecified chronic kidney disease: Secondary | ICD-10-CM | POA: Diagnosis not present

## 2024-08-09 DIAGNOSIS — I509 Heart failure, unspecified: Secondary | ICD-10-CM | POA: Diagnosis not present

## 2024-08-09 DIAGNOSIS — G4709 Other insomnia: Secondary | ICD-10-CM | POA: Diagnosis not present

## 2024-08-09 DIAGNOSIS — D631 Anemia in chronic kidney disease: Secondary | ICD-10-CM | POA: Diagnosis not present

## 2024-08-09 DIAGNOSIS — I69322 Dysarthria following cerebral infarction: Secondary | ICD-10-CM | POA: Diagnosis not present

## 2024-08-09 NOTE — Telephone Encounter (Signed)
 FYI Only or Action Required?: Action required by provider: update on patient condition.  Patient was last seen in primary care on 06/16/2024 by Georgina Speaks, FNP.  Called Nurse Triage reporting Shortness of Breath (Weight gain).  Symptoms began today.  Interventions attempted: Nothing.  Symptoms are: unsure.  Triage Disposition: No disposition on file.  Patient/caregiver understands and will follow disposition?: UnsureAnswer Assessment - Initial Assessment Questions Melissa, OT, called with concerns of SOB and weight gain of 2.5 pounds in 2 days. She is no longer with pt. Pt was SOB during movement . Pts oxygen sat dropped to 90% during movement  and was 98% at rest.  Pt told Melissa he was dizzy  yesterday but none today. Melissa stated she didn't feel this was an emergency since he hadn't taken morning meds. There was a lot going on this morning with his care. Medicaid nurse was there doing well check too.  RN attempted to call daughter Newell is who is with pt but unable to reach her. CAL notified of importance. Please advise.  1. RESPIRATORY STATUS: Describe your breathing? (e.g., wheezing, shortness of breath, unable to speak, severe coughing)      SOB with movement  2. ONSET: When did this breathing problem begin?      Not sure 3. PATTERN Does the difficult breathing come and go, or has it been constant since it started?      Comes and goes 4. SEVERITY: How bad is your breathing? (e.g., mild, moderate, severe)      mild 5. RECURRENT SYMPTOM: Have you had difficulty breathing before? If Yes, ask: When was the last time? and What happened that time?      Not sure 6. CARDIAC HISTORY: Do you have any history of heart disease? (e.g., heart attack, angina, bypass surgery, angioplasty)      Not sure 7. LUNG HISTORY: Do you have any history of lung disease?  (e.g., pulmonary embolus, asthma, emphysema)     Not sure  8. CAUSE: What do you think is causing the  breathing problem?      Not sure 9. OTHER SYMPTOMS: Do you have any other symptoms? (e.g., chest pain, cough, dizziness, fever, runny nose)     denies 10. O2 SATURATION MONITOR:  Do you use an oxygen saturation monitor (pulse oximeter) at home? If Yes, ask: What is your reading (oxygen level) today? What is your usual oxygen saturation reading? (e.g., 95%)       90-98%  Protocols used: Breathing Difficulty-A-AH

## 2024-08-09 NOTE — Telephone Encounter (Signed)
 Copied from CRM [id number]. Topic: Clinical - Red Word Triage >> Aug 09, 2024 10:03 AM Drema MATSU wrote: Red Word that prompted transfer to Nurse Triage: Jesus Lam wants to report that patient has 2.4 weight gain within 2 days. He is now 243.9. He is more short of breath than last time. He was 241.4 as of the 09/13. He hasn't had his morning medications yet.  Caller not on line when call was transferred- attempted to contact nurse- had to leave VM to call office. Call to daughter- she is out of town- request call to Jesus Lam- [phone] she is with patient now.

## 2024-08-09 NOTE — Telephone Encounter (Signed)
 Copied from CRM [id number]. Topic: Clinical - Red Word Triage >> Aug 09, 2024 10:21 AM Shelba HERO wrote: Red Word that prompted transfer to Nurse Triage: The caller is calling back for she was speaking to a nurse and maybe it was Nurse Slater and was disconnected, about the patient having shortness of breath.  Message left for patient to call back to Nurse Triage. Will place in call backs.

## 2024-08-09 NOTE — Progress Notes (Deleted)
 Jesus Lam, CMA,acting as a Neurosurgeon for Jesus Ada, FNP.,have documented all relevant documentation on the behalf of Jesus Ada, FNP,as directed by  Jesus Ada, FNP while in the presence of Jesus Ada, FNP.  Subjective:  Patient ID: Jesus Lam , male    DOB: 1933/05/07 , 89 y.o.   MRN: [id number]  No chief complaint on file.   HPI  HPI   Past Medical History:  Diagnosis Date   Anemia    BPH (benign prostatic hyperplasia)    Cataracts, bilateral    CVA (cerebral vascular accident) (HCC)    DVT (deep venous thrombosis) (HCC)    dvt in left leg   Dyslipidemia    Dysuria 06/17/2023   Gout    Gout    HTN (hypertension)    Left hip pain 03/15/2020   Left leg pain    Lower abdominal pain 11/03/2023   Osteoarthritis    PAD (peripheral artery disease) (HCC)    Stasis dermatitis    Stroke (HCC)    Vitamin D  deficiency      Family History  Problem Relation Age of Onset   Cancer Mother    Cancer Father    Alcohol abuse Father    Breast cancer Daughter    Stroke Daughter    CVA Daughter      Current Outpatient Medications:    amLODipine  (NORVASC ) 5 MG tablet, Take 1 tablet (5 mg total) by mouth daily., Disp: 90 tablet, Rfl: 3   apixaban  (ELIQUIS ) 5 MG TABS tablet, Take 1 tablet (5 mg total) by mouth 2 (two) times daily., Disp: 60 tablet, Rfl: 5   atorvastatin  (LIPITOR ) 40 MG tablet, Take 2 tablets (80 mg total) by mouth daily., Disp: 180 tablet, Rfl: 1   azelastine  (ASTELIN ) 0.1 % nasal spray, Place 2 sprays into both nostrils 2 (two) times daily. Use in each nostril as directed, Disp: 30 mL, Rfl: 5   Cholecalciferol  (VITAMIN D3) 5000 units CAPS, Take 5,000 Units by mouth daily., Disp: , Rfl:    diclofenac  Sodium (VOLTAREN ) 1 % GEL, APPLY TWO GRAMS TOPICALLY FOUR TIMES A DAY, Disp: 100 g, Rfl: 1   furosemide  (LASIX ) 20 MG tablet, TAKE ONE TABLET BY MOUTH DAILY, Disp: 90 tablet, Rfl: 1   GNP 8 HOUR ARTHRITIS RELIEF 650 MG CR tablet, TAKE ONE TABLET BY MOUTH  TWICE A DAY, Disp: 60 tablet, Rfl: 1   hydrALAZINE  (APRESOLINE ) 50 MG tablet, TAKE ONE TABLET BY MOUTH THREE TIMES A DAY, Disp: 90 tablet, Rfl: 3   methimazole  (TAPAZOLE ) 10 MG tablet, Take 1 tablet (10 mg total) by mouth daily., Disp: 30 tablet, Rfl: 6   mirtazapine  (REMERON ) 7.5 MG tablet, Take 1 tablet (7.5 mg total) by mouth at bedtime., Disp: 30 tablet, Rfl: 2   Multiple Vitamins-Minerals (COMPLETE SENIOR PO), 1 tablet daily. , Disp: , Rfl:    tamsulosin  (FLOMAX ) 0.4 MG CAPS capsule, TAKE ONE CAPSULE BY MOUTH DAILY HALF HOUR FOLLOWING THE SAME MEAL DAILY (Patient taking differently: Take 0.8 mg by mouth daily. TAKE ONE CAPSULE BY MOUTH DAILY HALF HOUR FOLLOWING THE SAME MEAL DAILY), Disp: 90 capsule, Rfl: 4   vitamin B-12 (CYANOCOBALAMIN ) 500 MCG tablet, Take 500 mcg by mouth daily., Disp: , Rfl:    vitamin C  (ASCORBIC ACID ) 500 MG tablet, Take 500 mg daily by mouth., Disp: , Rfl:   Current Facility-Administered Medications:    0.9 %  sodium chloride  infusion, , Intravenous, PRN, Ghumman, Ramandeep, NP   Allergies  Allergen Reactions  Pork-Derived Products Other (See Comments)    Religious    Shellfish Allergy Other (See Comments)    Gout      Review of Systems   There were no vitals filed for this visit. There is no height or weight on file to calculate BMI.  Wt Readings from Last 3 Encounters:  07/13/24 242 lb (109.8 kg)  06/16/24 244 lb (110.7 kg)  06/16/24 244 lb (110.7 kg)    The ASCVD Risk score (Arnett DK, et al., 2019) failed to calculate for the following reasons:   The 2019 ASCVD risk score is only valid for ages 37 to 17   Risk score cannot be calculated because patient has a medical history suggesting prior/existing ASCVD  Objective:  Physical Exam      Assessment And Plan:  There are no diagnoses linked to this encounter.  No follow-ups on file.  Patient was given opportunity to ask questions. Patient verbalized understanding of the plan and was able to  repeat key elements of the plan. All questions were answered to their satisfaction.    Jesus Jesus Ada, FNP, have reviewed all documentation for this visit. The documentation on 08/09/24 for the exam, diagnosis, procedures, and orders are all accurate and complete.   IF YOU HAVE BEEN REFERRED TO A SPECIALIST, IT MAY TAKE 1-2 WEEKS TO SCHEDULE/PROCESS THE REFERRAL. IF YOU HAVE NOT HEARD FROM US /SPECIALIST IN TWO WEEKS, PLEASE GIVE US  A CALL AT [phone].

## 2024-08-10 ENCOUNTER — Encounter (HOSPITAL_COMMUNITY): Payer: Self-pay | Admitting: *Deleted

## 2024-08-10 ENCOUNTER — Ambulatory Visit (INDEPENDENT_AMBULATORY_CARE_PROVIDER_SITE_OTHER): Admitting: Nurse Practitioner

## 2024-08-10 ENCOUNTER — Encounter: Payer: Self-pay | Admitting: Nurse Practitioner

## 2024-08-10 VITALS — BP 140/80 | HR 77 | Temp 98.4°F | Ht 72.0 in | Wt 248.0 lb

## 2024-08-10 DIAGNOSIS — N1831 Chronic kidney disease, stage 3a: Secondary | ICD-10-CM | POA: Diagnosis not present

## 2024-08-10 DIAGNOSIS — I4892 Unspecified atrial flutter: Secondary | ICD-10-CM

## 2024-08-10 DIAGNOSIS — M7989 Other specified soft tissue disorders: Secondary | ICD-10-CM

## 2024-08-10 DIAGNOSIS — I5022 Chronic systolic (congestive) heart failure: Secondary | ICD-10-CM

## 2024-08-10 DIAGNOSIS — Z6833 Body mass index (BMI) 33.0-33.9, adult: Secondary | ICD-10-CM | POA: Diagnosis not present

## 2024-08-10 DIAGNOSIS — I130 Hypertensive heart and chronic kidney disease with heart failure and stage 1 through stage 4 chronic kidney disease, or unspecified chronic kidney disease: Secondary | ICD-10-CM | POA: Diagnosis not present

## 2024-08-10 DIAGNOSIS — R0602 Shortness of breath: Secondary | ICD-10-CM | POA: Insufficient documentation

## 2024-08-10 DIAGNOSIS — I131 Hypertensive heart and chronic kidney 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 failure, with stage 1 through stage 4 chronic kidney disease, or unspecified chronic kidney disease: Secondary | ICD-10-CM

## 2024-08-10 DIAGNOSIS — E66811 Obesity, class 1: Secondary | ICD-10-CM

## 2024-08-10 LAB — BMP8+EGFR
BUN/Creatinine Ratio: 15 (ref 10–24)
BUN: 19 mg/dL (ref 8–27)
CO2: 23 mmol/L (ref 20–29)
Calcium: 9.4 mg/dL (ref 8.6–10.2)
Chloride: 106 mmol/L (ref 96–106)
Creatinine, Ser: 1.3 mg/dL — ABNORMAL HIGH (ref 0.76–1.27)
Glucose: 94 mg/dL (ref 70–99)
Potassium: 4.3 mmol/L (ref 3.5–5.2)
Sodium: 140 mmol/L (ref 134–144)
eGFR: 53 mL/min/1.73 — ABNORMAL LOW (ref >59)

## 2024-08-10 LAB — CBC
Hematocrit: 38.9 % (ref 37.5–51.0)
Hemoglobin: 11.9 g/dL — ABNORMAL LOW (ref 13.0–17.7)
MCH: 28.4 pg (ref 26.6–33.0)
MCHC: 30.6 g/dL — ABNORMAL LOW (ref 31.5–35.7)
MCV: 93 fL (ref 79–97)
Platelets: 193 x10E3/uL (ref 150–450)
RBC: 4.19 x10E6/uL (ref 4.14–5.80)
RDW: 13.7 % (ref 11.6–15.4)
WBC: 5.5 x10E3/uL (ref 3.4–10.8)

## 2024-08-10 NOTE — Patient Instructions (Signed)
 You can take him to get a Chest Xray at [facility] at [address] this week.

## 2024-08-10 NOTE — Progress Notes (Signed)
 I,[name], CMA,acting as a Neurosurgeon for [employer], FNP.,have documented all relevant documentation on the behalf of Jesus Ada, FNP,as directed by  Jesus Ada, FNP while in the presence of Jesus Ada, FNP.  Subjective:  Patient ID: Jesus Lam , male    DOB: 09-28-1933 , 89 y.o.   MRN: [id number]  Chief Complaint  Patient presents with   Leg Swelling    Patient presents today for sob. It has been going on since he had a stroke but the last 2 weeks it has worsened and he has fluid on his leg.      HPI  Discussed the use of AI scribe software for clinical note transcription with the patient, who gave verbal consent to proceed.  History of Present Illness Jesus Lam is an 89 year old male with congestive heart failure and atrial flutter who presents with worsening shortness of breath and leg swelling.  He experiences worsening shortness of breath, particularly with movement, with associated chest tightness. The sensation is described as 'really hurt' and is more pronounced during physical activity rather than at rest.  He has noticed leg swelling, which has decreased since last week. He is currently taking Lasix  20 mg daily for fluid management.  He continues on Eliquis  5 mg twice a day for anticoagulation and takes methimazole  for hyperthyroidism, which has shown improvement in his thyroid  levels from April to August. He was previously on a beta blocker, which was discontinued in April. He was recently seen by Dr. Patwardin who ordered labs for BNP, BMP and CBC in Aug but did not have done  He is engaged in physical therapy with [facility] and finds it beneficial. He inquires about the possibility of swimming, indicating a desire to maintain physical activity.  No chest tightness when taking a deep breath but confirms shortness of breath when moving.   Past Medical History:  Diagnosis Date   Anemia    BPH (benign prostatic hyperplasia)    Cataracts, bilateral     CVA (cerebral vascular accident) (HCC)    DVT (deep venous thrombosis) (HCC)    dvt in left leg   Dyslipidemia    Dysuria 06/17/2023   Gout    Gout    HTN (hypertension)    Left hip pain 03/15/2020   Left leg pain    Lower abdominal pain 11/03/2023   Osteoarthritis    PAD (peripheral artery disease) (HCC)    Stasis dermatitis    Stroke (HCC)    Vitamin D  deficiency      Family History  Problem Relation Age of Onset   Cancer Mother    Cancer Father    Alcohol abuse Father    Breast cancer Daughter    Stroke Daughter    CVA Daughter      Current Outpatient Medications:    amLODipine  (NORVASC ) 5 MG tablet, Take 1 tablet (5 mg total) by mouth daily., Disp: 90 tablet, Rfl: 3   apixaban  (ELIQUIS ) 5 MG TABS tablet, Take 1 tablet (5 mg total) by mouth 2 (two) times daily., Disp: 60 tablet, Rfl: 5   atorvastatin  (LIPITOR ) 40 MG tablet, Take 2 tablets (80 mg total) by mouth daily., Disp: 180 tablet, Rfl: 1   azelastine  (ASTELIN ) 0.1 % nasal spray, Place 2 sprays into both nostrils 2 (two) times daily. Use in each nostril as directed, Disp: 30 mL, Rfl: 5   Cholecalciferol  (VITAMIN D3) 5000 units CAPS, Take 5,000 Units by mouth daily., Disp: , Rfl:  diclofenac  Sodium (VOLTAREN ) 1 % GEL, APPLY TWO GRAMS TOPICALLY FOUR TIMES A DAY, Disp: 100 g, Rfl: 1   furosemide  (LASIX ) 20 MG tablet, TAKE ONE TABLET BY MOUTH DAILY, Disp: 90 tablet, Rfl: 1   GNP 8 HOUR ARTHRITIS RELIEF 650 MG CR tablet, TAKE ONE TABLET BY MOUTH TWICE A DAY, Disp: 60 tablet, Rfl: 1   hydrALAZINE  (APRESOLINE ) 50 MG tablet, TAKE ONE TABLET BY MOUTH THREE TIMES A DAY, Disp: 90 tablet, Rfl: 3   methimazole  (TAPAZOLE ) 10 MG tablet, Take 1 tablet (10 mg total) by mouth daily., Disp: 30 tablet, Rfl: 6   mirtazapine  (REMERON ) 7.5 MG tablet, Take 1 tablet (7.5 mg total) by mouth at bedtime., Disp: 30 tablet, Rfl: 2   Multiple Vitamins-Minerals (COMPLETE SENIOR PO), 1 tablet daily. , Disp: , Rfl:    tamsulosin  (FLOMAX ) 0.4 MG  CAPS capsule, TAKE ONE CAPSULE BY MOUTH DAILY HALF HOUR FOLLOWING THE SAME MEAL DAILY (Patient taking differently: Take 0.8 mg by mouth daily. TAKE ONE CAPSULE BY MOUTH DAILY HALF HOUR FOLLOWING THE SAME MEAL DAILY), Disp: 90 capsule, Rfl: 4   vitamin B-12 (CYANOCOBALAMIN ) 500 MCG tablet, Take 500 mcg by mouth daily., Disp: , Rfl:    vitamin C  (ASCORBIC ACID ) 500 MG tablet, Take 500 mg daily by mouth., Disp: , Rfl:   Current Facility-Administered Medications:    0.9 %  sodium chloride  infusion, , Intravenous, PRN, Ghumman, Ramandeep, NP   Allergies  Allergen Reactions   Pork-Derived Products Other (See Comments)    Religious    Shellfish Allergy Other (See Comments)    Gout      Review of Systems  Constitutional: Negative.   HENT: Negative.    Respiratory:  Positive for chest tightness and shortness of breath. Negative for cough and wheezing.   Cardiovascular: Negative.   Gastrointestinal:  Negative for abdominal distention.  Genitourinary: Negative.   Musculoskeletal: Negative.   Neurological: Negative.   Psychiatric/Behavioral: Negative.       Today's Vitals   08/10/24 1552  BP: (!) 140/80  Pulse: 77  Temp: 98.4 F (36.9 C)  TempSrc: Oral  SpO2: 94%  Weight: 248 lb (112.5 kg)  Height: 6' (1.829 m)  PainSc: 7   PainLoc: Leg   Body mass index is 33.63 kg/m.  Wt Readings from Last 3 Encounters:  08/10/24 248 lb (112.5 kg)  07/13/24 242 lb (109.8 kg)  06/16/24 244 lb (110.7 kg)    The ASCVD Risk score (Arnett DK, et al., 2019) failed to calculate for the following reasons:   The 2019 ASCVD risk score is only valid for ages 57 to 30   Risk score cannot be calculated because patient has a medical history suggesting prior/existing ASCVD  Objective:  Physical Exam Vitals and nursing note reviewed.  Constitutional:      General: He is not in acute distress.    Appearance: Normal appearance. He is obese.  Cardiovascular:     Rate and Rhythm: Normal rate and regular  rhythm.     Pulses: Normal pulses.     Heart sounds: Normal heart sounds. No murmur heard. Pulmonary:     Effort: Pulmonary effort is normal. No respiratory distress.     Breath sounds: Normal breath sounds. No wheezing.  Musculoskeletal:        General: No swelling.     Comments: Using cane  Skin:    General: Skin is warm and dry.     Capillary Refill: Capillary refill takes less than 2 seconds.  Neurological:     General: No focal deficit present.     Mental Status: He is alert and oriented to person, place, and time.  Psychiatric:        Mood and Affect: Mood normal.        Behavior: Behavior normal.        Thought Content: Thought content normal.        Judgment: Judgment normal.         Assessment And Plan:  Leg swelling Assessment & Plan: Not currently swelling.    Shortness of breath Assessment & Plan: Shortness of breath and leg swelling likely due to heart failure. Ejection fraction 45% with his most recent visit to Cardiology. Atrial flutter possibly exacerbated by hyperthyroidism. Limited rhythm control options due to hyperthyroidism and age. Cardioversion considered by Cardiologist. - Order BNP, comprehensive metabolic panel, and CBC today to see where he is - he has a schedule echocardiogram for 9/24. - His daughter is to speak with  EP lab for further recommendations. - Continue Eliquis  5 mg bid unless creatinine increases, then adjust dose as per note by Cardiologist - Order chest x-ray to assess lung status. - he is to have a scheduled nuclear stress test (Lexiscan). - according to the cardiology note he is to have a schedule TEE guided cardioversion. - Continue Lasix  20 mg daily.  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 - Monitor kidney function with blood tests. - he may need an adjustment of his Eliquis  dose if creatinine increases above 1.5.     Return if symptoms worsen or fail to improve.  Patient was given opportunity to ask questions. Patient verbalized understanding of the plan and was able to repeat key elements of the plan. All questions were answered to their satisfaction.    LILLETTE Jesus Ada, FNP, have reviewed all documentation for this visit. The documentation on 08/10/24 for the exam, diagnosis, procedures, and orders are all accurate and complete.   IF YOU HAVE BEEN REFERRED TO A SPECIALIST, IT MAY TAKE 1-2 WEEKS TO SCHEDULE/PROCESS THE REFERRAL. IF YOU HAVE NOT HEARD FROM US /SPECIALIST IN TWO WEEKS, PLEASE GIVE US  A CALL AT [phone].

## 2024-08-13 DIAGNOSIS — M1030 Gout due to renal impairment, unspecified site: Secondary | ICD-10-CM | POA: Diagnosis not present

## 2024-08-16 ENCOUNTER — Ambulatory Visit: Payer: Self-pay | Admitting: Nurse Practitioner

## 2024-08-16 DIAGNOSIS — H524 Presbyopia: Secondary | ICD-10-CM | POA: Diagnosis not present

## 2024-08-16 DIAGNOSIS — I483 Typical atrial flutter: Secondary | ICD-10-CM

## 2024-08-16 NOTE — Assessment & Plan Note (Signed)
 Not currently swelling.

## 2024-08-16 NOTE — Assessment & Plan Note (Signed)
 Shortness of breath and leg swelling likely due to heart failure. Ejection fraction 45% with his most recent visit to Cardiology. Atrial flutter possibly exacerbated by hyperthyroidism. Limited rhythm control options due to hyperthyroidism and age. Cardioversion considered by Cardiologist. - Order BNP, comprehensive metabolic panel, and CBC today to see where he is - he has a schedule echocardiogram for 9/24. - His daughter is to speak with  EP lab for further recommendations. - Continue Eliquis  5 mg bid unless creatinine increases, then adjust dose as per note by Cardiologist - Order chest x-ray to assess lung status. - he is to have a scheduled nuclear stress test (Lexiscan). - according to the cardiology note he is to have a schedule TEE guided cardioversion. - Continue Lasix  20 mg daily.

## 2024-08-16 NOTE — Assessment & Plan Note (Addendum)
 Blood pressure is slightly elevated,  Monitor kidney function due to potential impact on medication dosing, particularly Eliquis . - Monitor kidney function with blood tests. - he may need an adjustment of his Eliquis  dose if creatinine increases above 1.5.

## 2024-08-18 ENCOUNTER — Other Ambulatory Visit: Payer: Self-pay | Admitting: Cardiology

## 2024-08-18 ENCOUNTER — Ambulatory Visit: Admitting: Cardiovascular Disease

## 2024-08-18 ENCOUNTER — Ambulatory Visit (HOSPITAL_COMMUNITY)

## 2024-08-18 DIAGNOSIS — R072 Precordial pain: Secondary | ICD-10-CM

## 2024-08-18 DIAGNOSIS — I5020 Unspecified systolic (congestive) heart failure: Secondary | ICD-10-CM

## 2024-08-19 DIAGNOSIS — M1030 Gout due to renal impairment, unspecified site: Secondary | ICD-10-CM | POA: Diagnosis not present

## 2024-08-19 DIAGNOSIS — I69351 Hemiplegia and hemiparesis following cerebral infarction affecting right dominant side: Secondary | ICD-10-CM | POA: Diagnosis not present

## 2024-08-19 DIAGNOSIS — I130 Hypertensive heart and chronic kidney disease with heart failure and stage 1 through stage 4 chronic kidney disease, or unspecified chronic kidney disease: Secondary | ICD-10-CM | POA: Diagnosis not present

## 2024-08-19 DIAGNOSIS — N1831 Chronic kidney disease, stage 3a: Secondary | ICD-10-CM | POA: Diagnosis not present

## 2024-08-19 DIAGNOSIS — D631 Anemia in chronic kidney disease: Secondary | ICD-10-CM | POA: Diagnosis not present

## 2024-08-19 DIAGNOSIS — E0590 Thyrotoxicosis, unspecified without thyrotoxic crisis or storm: Secondary | ICD-10-CM | POA: Diagnosis not present

## 2024-08-19 DIAGNOSIS — I69322 Dysarthria following cerebral infarction: Secondary | ICD-10-CM | POA: Diagnosis not present

## 2024-08-19 DIAGNOSIS — G4709 Other insomnia: Secondary | ICD-10-CM | POA: Diagnosis not present

## 2024-08-19 DIAGNOSIS — I509 Heart failure, unspecified: Secondary | ICD-10-CM | POA: Diagnosis not present

## 2024-08-23 ENCOUNTER — Ambulatory Visit (HOSPITAL_COMMUNITY)
Admission: RE | Admit: 2024-08-23 | Discharge: 2024-08-23 | Disposition: A | Discharge status: Home / Self Care | Source: Ambulatory Visit | Attending: Cardiology | Admitting: Cardiology

## 2024-08-23 DIAGNOSIS — M1030 Gout due to renal impairment, unspecified site: Secondary | ICD-10-CM | POA: Diagnosis not present

## 2024-08-23 DIAGNOSIS — E0590 Thyrotoxicosis, unspecified without thyrotoxic crisis or storm: Secondary | ICD-10-CM | POA: Diagnosis not present

## 2024-08-23 DIAGNOSIS — D631 Anemia in chronic kidney disease: Secondary | ICD-10-CM | POA: Diagnosis not present

## 2024-08-23 DIAGNOSIS — I69322 Dysarthria following cerebral infarction: Secondary | ICD-10-CM | POA: Diagnosis not present

## 2024-08-23 DIAGNOSIS — I5020 Unspecified systolic (congestive) heart failure: Secondary | ICD-10-CM | POA: Insufficient documentation

## 2024-08-23 DIAGNOSIS — G4709 Other insomnia: Secondary | ICD-10-CM | POA: Diagnosis not present

## 2024-08-23 DIAGNOSIS — I69351 Hemiplegia and hemiparesis following cerebral infarction affecting right dominant side: Secondary | ICD-10-CM | POA: Diagnosis not present

## 2024-08-23 DIAGNOSIS — R072 Precordial pain: Secondary | ICD-10-CM | POA: Diagnosis not present

## 2024-08-23 DIAGNOSIS — I509 Heart failure, unspecified: Secondary | ICD-10-CM | POA: Diagnosis not present

## 2024-08-23 DIAGNOSIS — N1831 Chronic kidney disease, stage 3a: Secondary | ICD-10-CM | POA: Diagnosis not present

## 2024-08-23 DIAGNOSIS — I130 Hypertensive heart and chronic kidney disease with heart failure and stage 1 through stage 4 chronic kidney disease, or unspecified chronic kidney disease: Secondary | ICD-10-CM | POA: Diagnosis not present

## 2024-08-23 LAB — MYOCARDIAL PERFUSION IMAGING
LV dias vol: 269 mL (ref 62–150)
LV sys vol: 181 mL (ref 4.2–5.8)
Nuc Stress EF: 33 %
Peak HR: 96 {beats}/min
Rest HR: 78 {beats}/min
Rest Nuclear Isotope Dose: 10.1 mCi
SDS: 0
SRS: 5
SSS: 2
ST Depression (mm): 0 mm
Stress Nuclear Isotope Dose: 30.7 mCi
TID: 0.92

## 2024-08-23 MED ORDER — TECHNETIUM TC 99M TETROFOSMIN IV KIT
30.7000 | PACK | Freq: Once | INTRAVENOUS | Status: AC | PRN
  Administered 2024-08-23: 30.7 via INTRAVENOUS

## 2024-08-23 MED ORDER — TECHNETIUM TC 99M TETROFOSMIN IV KIT
10.1000 | PACK | Freq: Once | INTRAVENOUS | Status: AC | PRN
Start: 2024-08-23 — End: 2024-08-23
  Administered 2024-08-23: 10.1 via INTRAVENOUS

## 2024-08-23 MED ORDER — REGADENOSON 0.4 MG/5ML IV SOLN
INTRAVENOUS | Status: AC
  Filled 2024-08-23: qty 5

## 2024-08-23 MED ORDER — REGADENOSON 0.4 MG/5ML IV SOLN
0.4000 mg | Freq: Once | INTRAVENOUS | Status: AC
  Administered 2024-08-23: 0.4 mg via INTRAVENOUS

## 2024-08-24 ENCOUNTER — Ambulatory Visit: Payer: Self-pay | Admitting: Cardiology

## 2024-08-24 DIAGNOSIS — I130 Hypertensive heart and chronic kidney disease with heart failure and stage 1 through stage 4 chronic kidney disease, or unspecified chronic kidney disease: Secondary | ICD-10-CM | POA: Diagnosis not present

## 2024-08-25 ENCOUNTER — Other Ambulatory Visit: Payer: Self-pay | Admitting: Nurse Practitioner

## 2024-08-25 DIAGNOSIS — I69351 Hemiplegia and hemiparesis following cerebral infarction affecting right dominant side: Secondary | ICD-10-CM | POA: Diagnosis not present

## 2024-08-25 DIAGNOSIS — I130 Hypertensive heart and chronic kidney disease with heart failure and stage 1 through stage 4 chronic kidney disease, or unspecified chronic kidney disease: Secondary | ICD-10-CM | POA: Diagnosis not present

## 2024-08-25 DIAGNOSIS — D631 Anemia in chronic kidney disease: Secondary | ICD-10-CM | POA: Diagnosis not present

## 2024-08-25 DIAGNOSIS — M19042 Primary osteoarthritis, left hand: Secondary | ICD-10-CM

## 2024-08-25 DIAGNOSIS — G4709 Other insomnia: Secondary | ICD-10-CM | POA: Diagnosis not present

## 2024-08-25 DIAGNOSIS — M1030 Gout due to renal impairment, unspecified site: Secondary | ICD-10-CM | POA: Diagnosis not present

## 2024-08-25 DIAGNOSIS — E0590 Thyrotoxicosis, unspecified without thyrotoxic crisis or storm: Secondary | ICD-10-CM

## 2024-08-25 DIAGNOSIS — I509 Heart failure, unspecified: Secondary | ICD-10-CM | POA: Diagnosis not present

## 2024-08-25 DIAGNOSIS — N1831 Chronic kidney disease, stage 3a: Secondary | ICD-10-CM | POA: Diagnosis not present

## 2024-08-25 DIAGNOSIS — I69322 Dysarthria following cerebral infarction: Secondary | ICD-10-CM | POA: Diagnosis not present

## 2024-08-27 NOTE — Progress Notes (Signed)
 Spoke to patient's daughter, [name] and instructed them to come at 0630  and to be NPO after 0000.     Confirmed that patient will have a ride home and someone to stay with them for 24 hours after the procedure.   Medications reviewed.  Confirmed blood thinner.  Confirmed no breaks in taking blood thinner for 3+ weeks prior to procedure.

## 2024-08-30 ENCOUNTER — Encounter (HOSPITAL_COMMUNITY): Admission: RE | Payer: Self-pay | Source: Home / Self Care

## 2024-08-30 ENCOUNTER — Encounter (HOSPITAL_COMMUNITY): Payer: Self-pay

## 2024-08-30 ENCOUNTER — Inpatient Hospital Stay (HOSPITAL_COMMUNITY): Admission: RE | Admit: 2024-08-30 | Source: Ambulatory Visit

## 2024-08-30 ENCOUNTER — Ambulatory Visit (HOSPITAL_COMMUNITY)
Admission: RE | Admit: 2024-08-30 | Source: Home / Self Care | Admitting: Student in an Organized Health Care Education/Training Program

## 2024-08-30 DIAGNOSIS — I483 Typical atrial flutter: Secondary | ICD-10-CM

## 2024-08-30 SURGERY — TRANSESOPHAGEAL ECHOCARDIOGRAM (TEE) (CATHLAB)
Anesthesia: Monitor Anesthesia Care

## 2024-08-30 NOTE — Anesthesia Preprocedure Evaluation (Signed)
 Anesthesia Evaluation    Airway        Dental   Pulmonary former smoker          Cardiovascular hypertension, + Peripheral Vascular Disease, +CHF, + DOE and + DVT  + dysrhythmias Atrial Fibrillation   TTE (2025):  1. Left ventricular ejection fraction, by estimation, is 45 to 50%. The  left ventricle has mildly decreased function. The left ventricle  demonstrates global hypokinesis. There is moderate concentric left  ventricular hypertrophy. Left ventricular  diastolic parameters are consistent with Grade I diastolic dysfunction  (impaired relaxation).   2. Right ventricular systolic function is normal. The right ventricular  size is normal. There is normal pulmonary artery systolic pressure.   3. Left atrial size was moderately dilated.   4. The mitral valve is normal in structure. Trivial mitral valve  regurgitation. No evidence of mitral stenosis.   5. The aortic valve is calcified. There is mild calcification of the  aortic valve. There is mild thickening of the aortic valve. Aortic valve  regurgitation is mild to moderate. Mild aortic valve stenosis. Aortic  valve area, by VTI measures 1.41 cm.  Aortic valve mean gradient measures 12.0 mmHg. Aortic valve Vmax measures  2.51 m/s.   6. Aortic dilatation noted. There is mild dilatation of the ascending  aorta, measuring 41 mm.   7. The inferior vena cava is normal in size with greater than 50%  respiratory var     Neuro/Psych CVA    GI/Hepatic   Endo/Other   Hyperthyroidism   Renal/GU      Musculoskeletal  (+) Arthritis ,    Abdominal   Peds  Hematology  (+) Blood dyscrasia, anemia   Anesthesia Other Findings   Reproductive/Obstetrics                              Anesthesia Physical Anesthesia Plan  ASA: 3  Anesthesia Plan: MAC   Post-op Pain Management:    Induction:   PONV Risk Score and Plan: 1 and Propofol  infusion  Airway Management Planned: Simple Face Mask  Additional Equipment:   Intra-op Plan:   Post-operative Plan: Extubation in OR  Informed Consent:      Dental advisory given  Plan Discussed with: CRNA and Surgeon  Anesthesia Plan Comments:          Anesthesia Quick Evaluation

## 2024-08-31 DIAGNOSIS — I509 Heart failure, unspecified: Secondary | ICD-10-CM | POA: Diagnosis not present

## 2024-08-31 DIAGNOSIS — G4709 Other insomnia: Secondary | ICD-10-CM | POA: Diagnosis not present

## 2024-08-31 DIAGNOSIS — E0590 Thyrotoxicosis, unspecified without thyrotoxic crisis or storm: Secondary | ICD-10-CM | POA: Diagnosis not present

## 2024-08-31 DIAGNOSIS — D631 Anemia in chronic kidney disease: Secondary | ICD-10-CM | POA: Diagnosis not present

## 2024-08-31 DIAGNOSIS — M1030 Gout due to renal impairment, unspecified site: Secondary | ICD-10-CM | POA: Diagnosis not present

## 2024-08-31 DIAGNOSIS — I69322 Dysarthria following cerebral infarction: Secondary | ICD-10-CM | POA: Diagnosis not present

## 2024-08-31 DIAGNOSIS — I130 Hypertensive heart and chronic kidney disease with heart failure and stage 1 through stage 4 chronic kidney disease, or unspecified chronic kidney disease: Secondary | ICD-10-CM | POA: Diagnosis not present

## 2024-08-31 DIAGNOSIS — I69351 Hemiplegia and hemiparesis following cerebral infarction affecting right dominant side: Secondary | ICD-10-CM | POA: Diagnosis not present

## 2024-08-31 DIAGNOSIS — N1831 Chronic kidney disease, stage 3a: Secondary | ICD-10-CM | POA: Diagnosis not present

## 2024-09-01 DIAGNOSIS — I69322 Dysarthria following cerebral infarction: Secondary | ICD-10-CM | POA: Diagnosis not present

## 2024-09-01 DIAGNOSIS — M1030 Gout due to renal impairment, unspecified site: Secondary | ICD-10-CM | POA: Diagnosis not present

## 2024-09-01 DIAGNOSIS — E0590 Thyrotoxicosis, unspecified without thyrotoxic crisis or storm: Secondary | ICD-10-CM | POA: Diagnosis not present

## 2024-09-01 DIAGNOSIS — G4709 Other insomnia: Secondary | ICD-10-CM | POA: Diagnosis not present

## 2024-09-01 DIAGNOSIS — N1831 Chronic kidney disease, stage 3a: Secondary | ICD-10-CM | POA: Diagnosis not present

## 2024-09-01 DIAGNOSIS — D631 Anemia in chronic kidney disease: Secondary | ICD-10-CM | POA: Diagnosis not present

## 2024-09-01 DIAGNOSIS — I69351 Hemiplegia and hemiparesis following cerebral infarction affecting right dominant side: Secondary | ICD-10-CM | POA: Diagnosis not present

## 2024-09-01 DIAGNOSIS — I130 Hypertensive heart and chronic kidney disease with heart failure and stage 1 through stage 4 chronic kidney disease, or unspecified chronic kidney disease: Secondary | ICD-10-CM | POA: Diagnosis not present

## 2024-09-03 ENCOUNTER — Ambulatory Visit: Payer: Self-pay

## 2024-09-03 DIAGNOSIS — I130 Hypertensive heart and chronic kidney disease with heart failure and stage 1 through stage 4 chronic kidney disease, or unspecified chronic kidney disease: Secondary | ICD-10-CM | POA: Diagnosis not present

## 2024-09-03 NOTE — Telephone Encounter (Signed)
 FYI Only or Action Required?: FYI only for provider.  Patient was last seen in primary care on 08/10/2024 by Jesus Speaks, FNP.  Called Nurse Triage reporting Shortness of Breath.  Symptoms began yesterday.  Interventions attempted: Prescription medications: lasix .  Symptoms are: gradually worsening.  Triage Disposition: Go to ED Now (Notify PCP)  Patient/caregiver understands and will follow disposition?: Yes  Copied from CRM [id number]. Topic: Clinical - Red Word Triage >> Sep 03, 2024  9:49 AM Everette C wrote: [facility] that prompted transfer to Nurse Triage: Melissa with [facility] has called to report a 5 pound weight gain for the patient within the past 48 hours. Reason for Disposition  [1] MODERATE difficulty breathing (e.g., Lam in phrases, SOB even at rest, pulse 100-120) AND [2] NEW-onset or WORSE than normal  Answer Assessment - Initial Assessment Questions [facility] OT, Melissa [phone], calls and reports SOB, bilat arm swellling, and 5 lb weight gain in 48 hours. Takes 10mg  lasix  daily. RN recommends pcp visit in 24 hrs, no availability. ED recommendation made, pt agreeable to ER   1. RESPIRATORY STATUS: Describe your breathing? (e.g., wheezing, shortness of breath, unable to speak, severe coughing)      Breathing normally now, winded with exertion  2. ONSET: When did this breathing problem begin?      yesterday 3. PATTERN Does the difficult breathing come and go, or has it been constant since it started?      Intermittent with exertion  4. SEVERITY: How bad is your breathing? (e.g., mild, moderate, severe)      Moderate 6/10 per pt  5. RECURRENT SYMPTOM: Have you had difficulty breathing before? If Yes, ask: When was the last time? and What happened that time?      Yes, legs swollen then 6. CARDIAC HISTORY: Do you have any history of heart disease? (e.g., heart attack, angina, bypass surgery, angioplasty)      HF, 20mg  lasix  daily 7. LUNG HISTORY:  Do you have any history of lung disease?  (e.g., pulmonary embolus, asthma, emphysema)     Unsure  8. CAUSE: What do you think is causing the breathing problem?      HF 9. OTHER SYMPTOMS: Do you have any other symptoms? (e.g., chest pain, cough, dizziness, fever, runny nose)     Denies, admits to SOB and BUE swelling 10. O2 SATURATION MONITOR:  Do you use an oxygen saturation monitor (pulse oximeter) at home? If Yes, ask: What is your reading (oxygen level) today? What is your usual oxygen saturation reading? (e.g., 95%)       HR60 RR17 BP142/82 O297% 257.4lbs  Protocols used: Breathing Difficulty-A-AH

## 2024-09-06 ENCOUNTER — Encounter (HOSPITAL_BASED_OUTPATIENT_CLINIC_OR_DEPARTMENT_OTHER): Payer: Self-pay | Admitting: Emergency Medicine

## 2024-09-06 ENCOUNTER — Other Ambulatory Visit: Payer: Self-pay

## 2024-09-06 ENCOUNTER — Ambulatory Visit: Payer: Self-pay

## 2024-09-06 ENCOUNTER — Inpatient Hospital Stay (HOSPITAL_BASED_OUTPATIENT_CLINIC_OR_DEPARTMENT_OTHER)
Admission: EM | Admit: 2024-09-06 | Discharge: 2024-09-17 | DRG: 291 | Disposition: A | Discharge status: Skilled Nursing Facility | Attending: Family Medicine | Admitting: Family Medicine

## 2024-09-06 ENCOUNTER — Emergency Department (HOSPITAL_BASED_OUTPATIENT_CLINIC_OR_DEPARTMENT_OTHER)

## 2024-09-06 DIAGNOSIS — Z6834 Body mass index (BMI) 34.0-34.9, adult: Secondary | ICD-10-CM

## 2024-09-06 DIAGNOSIS — B952 Enterococcus as the cause of diseases classified elsewhere: Secondary | ICD-10-CM | POA: Diagnosis not present

## 2024-09-06 DIAGNOSIS — E785 Hyperlipidemia, unspecified: Secondary | ICD-10-CM | POA: Diagnosis present

## 2024-09-06 DIAGNOSIS — N400 Enlarged prostate without lower urinary tract symptoms: Secondary | ICD-10-CM | POA: Diagnosis present

## 2024-09-06 DIAGNOSIS — Z91013 Allergy to seafood: Secondary | ICD-10-CM

## 2024-09-06 DIAGNOSIS — N390 Urinary tract infection, site not specified: Secondary | ICD-10-CM | POA: Diagnosis not present

## 2024-09-06 DIAGNOSIS — Z87891 Personal history of nicotine dependence: Secondary | ICD-10-CM

## 2024-09-06 DIAGNOSIS — Z7984 Long term (current) use of oral hypoglycemic drugs: Secondary | ICD-10-CM

## 2024-09-06 DIAGNOSIS — I69351 Hemiplegia and hemiparesis following cerebral infarction affecting right dominant side: Secondary | ICD-10-CM | POA: Diagnosis not present

## 2024-09-06 DIAGNOSIS — Z79899 Other long term (current) drug therapy: Secondary | ICD-10-CM

## 2024-09-06 DIAGNOSIS — I2510 Atherosclerotic heart disease of native coronary artery without angina pectoris: Secondary | ICD-10-CM | POA: Diagnosis present

## 2024-09-06 DIAGNOSIS — Z823 Family history of stroke: Secondary | ICD-10-CM

## 2024-09-06 DIAGNOSIS — N179 Acute kidney failure, unspecified: Secondary | ICD-10-CM | POA: Diagnosis not present

## 2024-09-06 DIAGNOSIS — B9620 Unspecified Escherichia coli [E. coli] as the cause of diseases classified elsewhere: Secondary | ICD-10-CM | POA: Diagnosis not present

## 2024-09-06 DIAGNOSIS — Z1152 Encounter for screening for COVID-19: Secondary | ICD-10-CM | POA: Diagnosis not present

## 2024-09-06 DIAGNOSIS — J9601 Acute respiratory failure with hypoxia: Secondary | ICD-10-CM

## 2024-09-06 DIAGNOSIS — R001 Bradycardia, unspecified: Secondary | ICD-10-CM | POA: Diagnosis present

## 2024-09-06 DIAGNOSIS — G4709 Other insomnia: Secondary | ICD-10-CM | POA: Diagnosis not present

## 2024-09-06 DIAGNOSIS — E0590 Thyrotoxicosis, unspecified without thyrotoxic crisis or storm: Secondary | ICD-10-CM | POA: Diagnosis not present

## 2024-09-06 DIAGNOSIS — I509 Heart failure, unspecified: Principal | ICD-10-CM

## 2024-09-06 DIAGNOSIS — I10 Essential (primary) hypertension: Secondary | ICD-10-CM | POA: Diagnosis not present

## 2024-09-06 DIAGNOSIS — M6281 Muscle weakness (generalized): Secondary | ICD-10-CM | POA: Diagnosis not present

## 2024-09-06 DIAGNOSIS — E66811 Obesity, class 1: Secondary | ICD-10-CM | POA: Diagnosis present

## 2024-09-06 DIAGNOSIS — I428 Other cardiomyopathies: Secondary | ICD-10-CM | POA: Diagnosis present

## 2024-09-06 DIAGNOSIS — D649 Anemia, unspecified: Secondary | ICD-10-CM | POA: Diagnosis present

## 2024-09-06 DIAGNOSIS — I77810 Thoracic aortic ectasia: Secondary | ICD-10-CM | POA: Diagnosis present

## 2024-09-06 DIAGNOSIS — R0602 Shortness of breath: Secondary | ICD-10-CM | POA: Diagnosis not present

## 2024-09-06 DIAGNOSIS — J984 Other disorders of lung: Secondary | ICD-10-CM | POA: Diagnosis not present

## 2024-09-06 DIAGNOSIS — Z7901 Long term (current) use of anticoagulants: Secondary | ICD-10-CM | POA: Diagnosis not present

## 2024-09-06 DIAGNOSIS — M1030 Gout due to renal impairment, unspecified site: Secondary | ICD-10-CM | POA: Diagnosis not present

## 2024-09-06 DIAGNOSIS — I69322 Dysarthria following cerebral infarction: Secondary | ICD-10-CM | POA: Diagnosis not present

## 2024-09-06 DIAGNOSIS — Z9841 Cataract extraction status, right eye: Secondary | ICD-10-CM

## 2024-09-06 DIAGNOSIS — N1831 Chronic kidney disease, stage 3a: Secondary | ICD-10-CM | POA: Diagnosis not present

## 2024-09-06 DIAGNOSIS — I959 Hypotension, unspecified: Secondary | ICD-10-CM | POA: Diagnosis not present

## 2024-09-06 DIAGNOSIS — R41841 Cognitive communication deficit: Secondary | ICD-10-CM | POA: Diagnosis not present

## 2024-09-06 DIAGNOSIS — M1990 Unspecified osteoarthritis, unspecified site: Secondary | ICD-10-CM | POA: Diagnosis present

## 2024-09-06 DIAGNOSIS — I130 Hypertensive heart and chronic kidney disease with heart failure and stage 1 through stage 4 chronic kidney disease, or unspecified chronic kidney disease: Principal | ICD-10-CM | POA: Diagnosis present

## 2024-09-06 DIAGNOSIS — R918 Other nonspecific abnormal finding of lung field: Secondary | ICD-10-CM | POA: Diagnosis not present

## 2024-09-06 DIAGNOSIS — D631 Anemia in chronic kidney disease: Secondary | ICD-10-CM | POA: Diagnosis not present

## 2024-09-06 DIAGNOSIS — R0902 Hypoxemia: Secondary | ICD-10-CM | POA: Diagnosis present

## 2024-09-06 DIAGNOSIS — Z811 Family history of alcohol abuse and dependence: Secondary | ICD-10-CM

## 2024-09-06 DIAGNOSIS — Z91119 Patient's noncompliance with dietary regimen due to unspecified reason: Secondary | ICD-10-CM

## 2024-09-06 DIAGNOSIS — I483 Typical atrial flutter: Secondary | ICD-10-CM | POA: Diagnosis present

## 2024-09-06 DIAGNOSIS — I110 Hypertensive heart disease with heart failure: Secondary | ICD-10-CM | POA: Diagnosis not present

## 2024-09-06 DIAGNOSIS — R2681 Unsteadiness on feet: Secondary | ICD-10-CM | POA: Diagnosis not present

## 2024-09-06 DIAGNOSIS — Z9842 Cataract extraction status, left eye: Secondary | ICD-10-CM

## 2024-09-06 DIAGNOSIS — Z86718 Personal history of other venous thrombosis and embolism: Secondary | ICD-10-CM

## 2024-09-06 DIAGNOSIS — Z91014 Allergy to mammalian meats: Secondary | ICD-10-CM

## 2024-09-06 DIAGNOSIS — I5023 Acute on chronic systolic (congestive) heart failure: Secondary | ICD-10-CM | POA: Diagnosis present

## 2024-09-06 DIAGNOSIS — I4819 Other persistent atrial fibrillation: Secondary | ICD-10-CM | POA: Diagnosis present

## 2024-09-06 DIAGNOSIS — I5021 Acute systolic (congestive) heart failure: Secondary | ICD-10-CM | POA: Diagnosis not present

## 2024-09-06 DIAGNOSIS — Z803 Family history of malignant neoplasm of breast: Secondary | ICD-10-CM

## 2024-09-06 DIAGNOSIS — I5043 Acute on chronic combined systolic (congestive) and diastolic (congestive) heart failure: Secondary | ICD-10-CM | POA: Diagnosis not present

## 2024-09-06 DIAGNOSIS — M7989 Other specified soft tissue disorders: Secondary | ICD-10-CM | POA: Diagnosis not present

## 2024-09-06 DIAGNOSIS — E559 Vitamin D deficiency, unspecified: Secondary | ICD-10-CM | POA: Diagnosis present

## 2024-09-06 LAB — COMPREHENSIVE METABOLIC PANEL WITH GFR
ALT: 13 U/L (ref 0–44)
AST: 20 U/L (ref 15–41)
Albumin: 3.8 g/dL (ref 3.5–5.0)
Alkaline Phosphatase: 90 U/L (ref 38–126)
Anion gap: 10 (ref 5–15)
BUN: 21 mg/dL (ref 8–23)
CO2: 26 mmol/L (ref 22–32)
Calcium: 9.9 mg/dL (ref 8.9–10.3)
Chloride: 107 mmol/L (ref 98–111)
Creatinine, Ser: 1.22 mg/dL (ref 0.61–1.24)
GFR, Estimated: 57 mL/min — ABNORMAL LOW (ref >60)
Glucose, Bld: 87 mg/dL (ref 70–99)
Potassium: 4.3 mmol/L (ref 3.5–5.1)
Sodium: 143 mmol/L (ref 135–145)
Total Bilirubin: 0.7 mg/dL (ref 0.0–1.2)
Total Protein: 7.9 g/dL (ref 6.5–8.1)

## 2024-09-06 LAB — CBC WITH DIFFERENTIAL/PLATELET
Abs Immature Granulocytes: 0.02 K/uL (ref 0.00–0.07)
Basophils Absolute: 0 K/uL (ref 0.0–0.1)
Basophils Relative: 0 %
Eosinophils Absolute: 0.1 K/uL (ref 0.0–0.5)
Eosinophils Relative: 2 %
HCT: 36.2 % — ABNORMAL LOW (ref 39.0–52.0)
Hemoglobin: 11.1 g/dL — ABNORMAL LOW (ref 13.0–17.0)
Immature Granulocytes: 0 %
Lymphocytes Relative: 36 %
Lymphs Abs: 2.1 K/uL (ref 0.7–4.0)
MCH: 28.5 pg (ref 26.0–34.0)
MCHC: 30.7 g/dL (ref 30.0–36.0)
MCV: 93.1 fL (ref 80.0–100.0)
Monocytes Absolute: 0.7 K/uL (ref 0.1–1.0)
Monocytes Relative: 11 %
Neutro Abs: 2.9 K/uL (ref 1.7–7.7)
Neutrophils Relative %: 51 %
Platelets: 179 K/uL (ref 150–400)
RBC: 3.89 MIL/uL — ABNORMAL LOW (ref 4.22–5.81)
RDW: 14.9 % (ref 11.5–15.5)
WBC: 5.8 K/uL (ref 4.0–10.5)
nRBC: 0 % (ref 0.0–0.2)

## 2024-09-06 LAB — MAGNESIUM: Magnesium: 1.9 mg/dL (ref 1.7–2.4)

## 2024-09-06 LAB — RESP PANEL BY RT-PCR (RSV, FLU A&B, COVID)  RVPGX2
Influenza A by PCR: NEGATIVE
Influenza B by PCR: NEGATIVE
Resp Syncytial Virus by PCR: NEGATIVE
SARS Coronavirus 2 by RT PCR: NEGATIVE

## 2024-09-06 LAB — PRO BRAIN NATRIURETIC PEPTIDE: Pro Brain Natriuretic Peptide: 846 pg/mL — ABNORMAL HIGH (ref <300.0)

## 2024-09-06 LAB — MRSA NEXT GEN BY PCR, NASAL: MRSA by PCR Next Gen: NOT DETECTED

## 2024-09-06 MED ORDER — FUROSEMIDE 10 MG/ML IJ SOLN
20.0000 mg | Freq: Once | INTRAMUSCULAR | Status: AC
  Administered 2024-09-06: 20 mg via INTRAVENOUS
  Filled 2024-09-06: qty 2

## 2024-09-06 NOTE — ED Provider Notes (Signed)
 [place] EMERGENCY DEPARTMENT AT [facility] Provider Note   CSN: [id number] Arrival date & time: 09/06/24  1324     Patient presents with: Shortness of Breath   Jesus Lam is a 89 y.o. male with a history of PAD, CVA, renal insufficiency, mild cognitive impairment, heart failure with reduced ejection fraction and a flutter on Eliquis  who presents for increased shortness of breath and leg swelling.  He is attended by family members.  They report that he has had increased swelling in his lower extremities and abdomen.  He does wear TED hose daily which has helped reduce some of the swelling in his ankles.  They have also noticed some distention in his abdomen .they note that he become increasingly short of breath at rest despite taking his regular dose of furosemide .  He states that he becomes markedly winded with any exertion or ambulation.  He was sent in for further evaluation and management.  He denies any chest pain, no recent fever or cough.    Shortness of Breath      Prior to Admission medications   Medication Sig Start Date End Date Taking? Authorizing Provider  amLODipine  (NORVASC ) 5 MG tablet Take 1 tablet (5 mg total) by mouth daily. 06/08/24   Patwardhan, Newman PARAS, MD  apixaban  (ELIQUIS ) 5 MG TABS tablet Take 1 tablet (5 mg total) by mouth 2 (two) times daily. 06/07/24   Patwardhan, Newman PARAS, MD  atorvastatin  (LIPITOR ) 40 MG tablet Take 2 tablets (80 mg total) by mouth daily. 03/10/24   Patwardhan, Newman PARAS, MD  azelastine  (ASTELIN ) 0.1 % nasal spray Place 2 sprays into both nostrils 2 (two) times daily. Use in each nostril as directed 08/15/21   Georgina Speaks, FNP  Cholecalciferol  (VITAMIN D3) 5000 units CAPS Take 5,000 Units by mouth daily. 01/09/18   [provider]  diclofenac  Sodium (VOLTAREN ) 1 % GEL APPLY TWO GRAMS TOPICALLY FOUR TIMES A DAY 07/01/22   Georgina Speaks, FNP  furosemide  (LASIX ) 20 MG tablet TAKE ONE TABLET BY MOUTH DAILY 06/21/24   Georgina Speaks, FNP  GNP 8 HOUR ARTHRITIS RELIEF 650 MG CR tablet TAKE ONE TABLET BY MOUTH TWICE A DAY 08/26/24   Moore, Janece, FNP  hydrALAZINE  (APRESOLINE ) 50 MG tablet TAKE ONE TABLET BY MOUTH THREE TIMES A DAY 06/07/24   Wyn Jackee VEAR Mickey., NP  methimazole  (TAPAZOLE ) 10 MG tablet Take 1 tablet (10 mg total) by mouth daily. 05/19/24   Shamleffer, Ibtehal Jaralla, MD  mirtazapine  (REMERON ) 7.5 MG tablet TAKE 1 TABLET (7.5 MG TOTAL) BY MOUTH AT BEDTIME. 08/26/24   Georgina Speaks, FNP  Multiple Vitamins-Minerals (COMPLETE SENIOR PO) 1 tablet daily.  06/06/18   [provider]  tamsulosin  (FLOMAX ) 0.4 MG CAPS capsule TAKE ONE CAPSULE BY MOUTH DAILY HALF HOUR FOLLOWING THE SAME MEAL DAILY Patient taking differently: Take 0.8 mg by mouth daily. TAKE ONE CAPSULE BY MOUTH DAILY HALF HOUR FOLLOWING THE SAME MEAL DAILY 11/13/21   Georgina Speaks, FNP  vitamin B-12 (CYANOCOBALAMIN ) 500 MCG tablet Take 500 mcg by mouth daily.    [provider]  vitamin C  (ASCORBIC ACID ) 500 MG tablet Take 500 mg daily by mouth.    [provider]    Allergies: Porcine (pork) protein-containing drug products and Shellfish allergy    Review of Systems  Respiratory:  Positive for shortness of breath.     Updated Vital Signs BP 130/79   Pulse 87   Temp 98.3 F (36.8 C)   Resp  20   SpO2 90%   Physical Exam Vitals and nursing note reviewed.  Constitutional:      General: He is not in acute distress.    Appearance: He is well-developed. He is not diaphoretic.  HENT:     Head: Normocephalic and atraumatic.  Eyes:     General: No scleral icterus.    Conjunctiva/sclera: Conjunctivae normal.  Cardiovascular:     Rate and Rhythm: Normal rate and regular rhythm.     Heart sounds: Normal heart sounds.  Pulmonary:     Effort: Pulmonary effort is normal. Tachypnea present. No respiratory distress.     Breath sounds: Examination of the left-lower field reveals decreased breath sounds. Decreased breath  sounds present.  Abdominal:     Palpations: Abdomen is soft.     Tenderness: There is no abdominal tenderness.  Musculoskeletal:     Cervical back: Normal range of motion and neck supple.     Right lower leg: Edema present.     Left lower leg: Edema present.  Skin:    General: Skin is warm and dry.  Neurological:     Mental Status: He is alert.  Psychiatric:        Behavior: Behavior normal.     (all labs ordered are listed, but only abnormal results are displayed) Labs Reviewed  RESP PANEL BY RT-PCR (RSV, FLU A&B, COVID)  [id number]  CBC WITH DIFFERENTIAL/PLATELET  PRO BRAIN NATRIURETIC PEPTIDE  COMPREHENSIVE METABOLIC PANEL WITH GFR    EKG: EKG Interpretation Date/Time:  Monday September 06 2024 13:37:24 EDT Ventricular Rate:  77 PR Interval:    QRS Duration:  139 QT Interval:  445 QTC Calculation: 504 R Axis:   -59  Text Interpretation: Atrial flutter with varied AV block, LVH with IVCD, LAD and secondary repol abnrm Inferior infarct, acute (RCA) No significant change since last tracing Confirmed by Jerrol Agent ([id number]) on 09/06/2024 1:41:45 PM  Radiology: No results found.   Procedures   Medications Ordered in the ED - No data to display  Clinical Course as of 09/06/24 1538  Mon Sep 06, 2024  1426 Patient here with increased shortness of breath.  Clinically I suspect that he is having volume overload.  Need to make sure his renal function hide does not worsen.  Original diagnosis includes ACS, symptomatic anemia, pleural effusion, PE, pulmonary edema, interstitial lung disease.  [AH]  1427 CBC with Differential(!) CBC has returned, hemoglobin at baseline for the patient. [AH]  1507 Pro Brain Natriuretic Peptide(!): 846.0 BNP elevated 846 [AH]  1508 Creatinine: 1.22 [AH]  1536 DG Chest Port 1 View Visualized and interpreted 1 view chest x-ray no obvious signs of obvious pulmonary edema. [AH]    Clinical Course User Index [AH] Arloa Chroman, PA-C                                  Medical Decision Making This is a 89 year old male who presents with increased shortness of breath now on 2 L via nasal cannula for hypoxia.  On examination he has significant edema in the lower extremities swelling in his abdomen.  Clinically patient appears to have symptoms compatible with CHF exacerbation.  He is not have any active chest pain.  Patient's chest x-ray fairly unremarkable however he has diminished breath sounds and minimal crackles in the left lower lung.  His abdomen is distended with fluid and his lower extremities are as well.  I have begun the patient on diuresis he takes 20 mg of oral Lasix .  His kidney function looks to be within normal limits.  Will admit the patient for acute CHF exacerbation and diuresis.  Amount and/or Complexity of Data Reviewed Labs: ordered. Decision-making details documented in ED Course. Radiology: ordered and independent interpretation performed. Decision-making details documented in ED Course. ECG/medicine tests: ordered and independent interpretation performed. Discussion of management or test interpretation with external provider(s): EKG shows a flutter at a rate of 77.  Risk Prescription drug management. Decision regarding hospitalization.         Final diagnoses:  None    ED Discharge Orders     None          Arloa Chroman, PA-C 09/09/24 2204    Jerrol Agent, MD 09/11/24 [phone]

## 2024-09-06 NOTE — H&P (Signed)
 History and Physical    Jesus Lam FMW:[id number] DOB: February 27, 1933 DOA: 09/06/2024  Patient coming from: Home.  Chief Complaint: Shortness of breath.  HPI: Jesus Lam is a 89 y.o. male with history of chronic HFrEF last EF measured was on August 23, 2024 when patient had myocardial perfusion scan which was negative for ischemia or infarct but did show EF of less than 35% with history of recurrent stroke prior history of DVT hypertension and recent diagnosis of atrial flutter presents to the ER with complaints of increasing shortness of breath and peripheral edema worsening over the last 2 weeks.  Denies any chest pain productive cough fever chills.  Patient states he has been compliant with his Lasix  20 mg daily.  He also has been more cautious about his diet recently.  ED Course: In the ER patient's EKG was showing atrial flutter controlled rate.  Chest x-ray was unremarkable.  proBNP was 846.  On exam patient has significant edema was given 20 mg IV Lasix  and admitted for further workup.  On exam patient has at least 3+ edema on both lower extremities and patient also feels his abdomen is getting distended.  He is on 2 L oxygen.  Since getting 20 mg IV Lasix  patient has put at least 500 cc out.  Admitted for CHF exacerbation.  Review of Systems: As per HPI, rest all negative.   Past Medical History:  Diagnosis Date   Anemia    BPH (benign prostatic hyperplasia)    Cataracts, bilateral    CVA (cerebral vascular accident) (HCC)    DVT (deep venous thrombosis) (HCC)    dvt in left leg   Dyslipidemia    Dysuria 06/17/2023   Gout    Gout    HTN (hypertension)    Left hip pain 03/15/2020   Left leg pain    Lower abdominal pain 11/03/2023   Osteoarthritis    PAD (peripheral artery disease)    Stasis dermatitis    Stroke (HCC)    Vitamin D  deficiency     Past Surgical History:  Procedure Laterality Date   ABDOMINAL AORTOGRAM N/A 01/13/2018   Procedure: ABDOMINAL  AORTOGRAM;  Surgeon: Elmira Newman PARAS, MD;  [facility]: [facility];  Service: Cardiovascular;  Laterality: N/A;   CATARACT EXTRACTION, BILATERAL     LEFT HEART CATH AND CORONARY ANGIOGRAPHY N/A 01/13/2018   Procedure: LEFT HEART CATH AND CORONARY ANGIOGRAPHY;  Surgeon: Elmira Newman PARAS, MD;  Location: [facility];  Service: Cardiovascular;  Laterality: N/A;   LOWER EXTREMITY ANGIOGRAPHY N/A 01/13/2018   Procedure: LOWER EXTREMITY ANGIOGRAPHY;  Surgeon: Elmira Newman PARAS, MD;  Location: [facility];  Service: Cardiovascular;  Laterality: N/A;   LOWER EXTREMITY ANGIOGRAPHY Right 01/27/2018   Procedure: LOWER EXTREMITY ANGIOGRAPHY;  Surgeon: Ladona Heinz, MD;  Location: [facility];  Service: Cardiovascular;  Laterality: Right;   PERIPHERAL VASCULAR ATHERECTOMY Left 01/13/2018   Procedure: PERIPHERAL VASCULAR ATHERECTOMY;  Surgeon: Elmira Newman PARAS, MD;  [facility]: [facility];  Service: Cardiovascular;  Laterality: Left;  SFA WITH PTA DRUG COATED BALLOON   PERIPHERAL VASCULAR INTERVENTION  01/27/2018   Procedure: PERIPHERAL VASCULAR INTERVENTION;  Surgeon: Ladona Heinz, MD;  [facility]: [facility];  Service: Cardiovascular;;     reports that he quit smoking about 45 years ago. His smoking use included cigarettes. He started smoking about 46 years ago. He has a 0.1 pack-year smoking history. He has never used smokeless tobacco. He reports that he  does not currently use alcohol after a past usage of about 6.0 - 7.0 standard drinks of alcohol per week. He reports that he does not currently use drugs after having used the following drugs: Marijuana.  Allergies  Allergen Reactions   Porcine (Pork) Protein-Containing Drug Products Other (See Comments)    Religious    Shellfish Allergy Other (See Comments)    Gout     Family History  Problem Relation Age of Onset   Cancer Mother    Cancer Father    Alcohol abuse Father    Breast cancer Daughter     Stroke Daughter    CVA Daughter     Prior to Admission medications   Medication Sig Start Date End Date Taking? Authorizing Provider  amLODipine  (NORVASC ) 5 MG tablet Take 1 tablet (5 mg total) by mouth daily. 06/08/24   Patwardhan, Newman PARAS, MD  apixaban  (ELIQUIS ) 5 MG TABS tablet Take 1 tablet (5 mg total) by mouth 2 (two) times daily. 06/07/24   Patwardhan, Newman PARAS, MD  atorvastatin  (LIPITOR ) 40 MG tablet Take 2 tablets (80 mg total) by mouth daily. 03/10/24   Patwardhan, Newman PARAS, MD  azelastine  (ASTELIN ) 0.1 % nasal spray Place 2 sprays into both nostrils 2 (two) times daily. Use in each nostril as directed 08/15/21   Georgina Speaks, FNP  Cholecalciferol  (VITAMIN D3) 5000 units CAPS Take 5,000 Units by mouth daily. 01/09/18   [provider]  diclofenac  Sodium (VOLTAREN ) 1 % GEL APPLY TWO GRAMS TOPICALLY FOUR TIMES A DAY 07/01/22   Moore, Janece, FNP  furosemide  (LASIX ) 20 MG tablet TAKE ONE TABLET BY MOUTH DAILY 06/21/24   Georgina Speaks, FNP  GNP 8 HOUR ARTHRITIS RELIEF 650 MG CR tablet TAKE ONE TABLET BY MOUTH TWICE A DAY 08/26/24   Moore, Janece, FNP  hydrALAZINE  (APRESOLINE ) 50 MG tablet TAKE ONE TABLET BY MOUTH THREE TIMES A DAY 06/07/24   Wyn Jackee VEAR Mickey., NP  methimazole  (TAPAZOLE ) 10 MG tablet Take 1 tablet (10 mg total) by mouth daily. 05/19/24   Shamleffer, Ibtehal Jaralla, MD  mirtazapine  (REMERON ) 7.5 MG tablet TAKE 1 TABLET (7.5 MG TOTAL) BY MOUTH AT BEDTIME. 08/26/24   Georgina Speaks, FNP  Multiple Vitamins-Minerals (COMPLETE SENIOR PO) 1 tablet daily.  06/06/18   [provider]  tamsulosin  (FLOMAX ) 0.4 MG CAPS capsule TAKE ONE CAPSULE BY MOUTH DAILY HALF HOUR FOLLOWING THE SAME MEAL DAILY Patient taking differently: Take 0.8 mg by mouth daily. TAKE ONE CAPSULE BY MOUTH DAILY HALF HOUR FOLLOWING THE SAME MEAL DAILY 11/13/21   Georgina Speaks, FNP  vitamin B-12 (CYANOCOBALAMIN ) 500 MCG tablet Take 500 mcg by mouth daily.    [provider]  vitamin C  (ASCORBIC  ACID) 500 MG tablet Take 500 mg daily by mouth.    [provider]    Physical Exam: Constitutional: Moderately built and nourished. Vitals:   09/06/24 2000 09/06/24 2015 09/06/24 2207 09/06/24 2319  BP: 134/80  (!) 164/95 (!) 142/92  Pulse: 78 76 93 78  Resp: (!) 22 18 19 16   Temp:   97.9 F (36.6 C) 98 F (36.7 C)  TempSrc:   Oral   SpO2: 98% 99% 96% 95%   Eyes: Anicteric no pallor. ENMT: No discharge from the ears eyes nose or mouth. Neck: JVD elevated. Respiratory: No rhonchi or crepitations. Cardiovascular: S1-S2 heard. Abdomen: Distended nontender bowel sound present. Musculoskeletal: Bilateral lower extremity edema present. Skin: No rash. Neurologic: Alert awake oriented time place and person.  Moves  all extremities. Psychiatric: Appears normal.  Normal affect.   Labs on Admission: I have personally reviewed following labs and imaging studies  CBC: Recent Labs  Lab 09/06/24 1414  WBC 5.8  NEUTROABS 2.9  HGB 11.1*  HCT 36.2*  MCV 93.1  PLT 179   Basic Metabolic Panel: Recent Labs  Lab 09/06/24 1414 09/06/24 1428  NA 143  --   K 4.3  --   CL 107  --   CO2 26  --   GLUCOSE 87  --   BUN 21  --   CREATININE 1.22  --   CALCIUM  9.9  --   MG  --  1.9   GFR: CrCl cannot be calculated (Unknown ideal weight.). Liver Function Tests: Recent Labs  Lab 09/06/24 1414  AST 20  ALT 13  ALKPHOS 90  BILITOT 0.7  PROT 7.9  ALBUMIN 3.8   No results for input(s): LIPASE, AMYLASE in the last 168 hours. No results for input(s): AMMONIA in the last 168 hours. Coagulation Profile: No results for input(s): INR, PROTIME in the last 168 hours. Cardiac Enzymes: No results for input(s): CKTOTAL, CKMB, CKMBINDEX, TROPONINI in the last 168 hours. BNP (last 3 results) Recent Labs    09/17/23 1504 09/06/24 1414  PROBNP 213 846.0*   HbA1C: No results for input(s): HGBA1C in the last 72 hours. CBG: No results for input(s): GLUCAP in  the last 168 hours. Lipid Profile: No results for input(s): CHOL, HDL, LDLCALC, TRIG, CHOLHDL, LDLDIRECT in the last 72 hours. Thyroid  Function Tests: No results for input(s): TSH, T4TOTAL, FREET4, T3FREE, THYROIDAB in the last 72 hours. Anemia Panel: No results for input(s): VITAMINB12, FOLATE, FERRITIN, TIBC, IRON, RETICCTPCT in the last 72 hours. Urine analysis:    Component Value Date/Time   COLORURINE YELLOW 08/06/2023 1316   APPEARANCEUR CLOUDY (A) 08/06/2023 1316   LABSPEC 1.014 08/06/2023 1316   PHURINE 6.0 08/06/2023 1316   GLUCOSEU NEGATIVE 08/06/2023 1316   HGBUR NEGATIVE 08/06/2023 1316   BILIRUBINUR Negative 11/03/2023 1642   KETONESUR NEGATIVE 08/06/2023 1316   PROTEINUR Positive (A) 11/03/2023 1642   PROTEINUR 30 (A) 08/06/2023 1316   UROBILINOGEN 0.2 11/03/2023 1642   UROBILINOGEN 0.2 03/26/2022 1619   NITRITE Negative 11/03/2023 1642   NITRITE NEGATIVE 08/06/2023 1316   LEUKOCYTESUR Small (1+) (A) 11/03/2023 1642   LEUKOCYTESUR LARGE (A) 08/06/2023 1316   Sepsis Labs: @LABRCNTIP (procalcitonin:4,lacticidven:4) ) Recent Results (from the past 240 hours)  Resp panel by RT-PCR (RSV, Flu A&B, Covid) Anterior Nasal Swab     Status: None   Collection Time: 09/06/24  2:14 PM   Specimen: Anterior Nasal Swab  Result Value Ref Range Status   SARS Coronavirus 2 by RT PCR NEGATIVE NEGATIVE Final    Comment: (NOTE) SARS-CoV-2 target nucleic acids are NOT DETECTED.  The SARS-CoV-2 RNA is generally detectable in upper respiratory specimens during the acute phase of infection. The lowest concentration of SARS-CoV-2 viral copies this assay can detect is 138 copies/mL. A negative result does not preclude SARS-Cov-2 infection and should not be used as the sole basis for treatment or other patient management decisions. A negative result may occur with  improper specimen collection/handling, submission of specimen other than nasopharyngeal  swab, presence of viral mutation(s) within the areas targeted by this assay, and inadequate number of viral copies(<138 copies/mL). A negative result must be combined with clinical observations, patient history, and epidemiological information. The expected result is Negative.  Fact Sheet for Patients:  BloggerCourse.com  Fact Sheet  for Healthcare Providers:  SeriousBroker.it  This test is no t yet approved or cleared by the [place]  FDA and  has been authorized for detection and/or diagnosis of SARS-CoV-2 by FDA under an Emergency Use Authorization (EUA). This EUA will remain  in effect (meaning this test can be used) for the duration of the COVID-19 declaration under Section 564(b)(1) of the Act, 21 U.S.C.section 360bbb-3(b)(1), unless the authorization is terminated  or revoked sooner.       Influenza A by PCR NEGATIVE NEGATIVE Final   Influenza B by PCR NEGATIVE NEGATIVE Final    Comment: (NOTE) The Xpert Xpress SARS-CoV-2/FLU/RSV plus assay is intended as an aid in the diagnosis of influenza from Nasopharyngeal swab specimens and should not be used as a sole basis for treatment. Nasal washings and aspirates are unacceptable for Xpert Xpress SARS-CoV-2/FLU/RSV testing.  Fact Sheet for Patients: BloggerCourse.com  Fact Sheet for Healthcare Providers: SeriousBroker.it  This test is not yet approved or cleared by the [place]  FDA and has been authorized for detection and/or diagnosis of SARS-CoV-2 by FDA under an Emergency Use Authorization (EUA). This EUA will remain in effect (meaning this test can be used) for the duration of the COVID-19 declaration under Section 564(b)(1) of the Act, 21 U.S.C. section 360bbb-3(b)(1), unless the authorization is terminated or revoked.     Resp Syncytial Virus by PCR NEGATIVE NEGATIVE Final    Comment: (NOTE) Fact Sheet for  Patients: BloggerCourse.com  Fact Sheet for Healthcare Providers: SeriousBroker.it  This test is not yet approved or cleared by the [place]  FDA and has been authorized for detection and/or diagnosis of SARS-CoV-2 by FDA under an Emergency Use Authorization (EUA). This EUA will remain in effect (meaning this test can be used) for the duration of the COVID-19 declaration under Section 564(b)(1) of the Act, 21 U.S.C. section 360bbb-3(b)(1), unless the authorization is terminated or revoked.  Performed at [facility], [address] [place], [place] [address]      Radiological Exams on Admission: DG Chest Port 1 View Result Date: 09/06/2024 CLINICAL DATA:  Shortness of breath and leg swelling. EXAM: PORTABLE CHEST 1 VIEW COMPARISON:  08/06/2023 FINDINGS: The heart is within normal limits in size given the AP projection and portable technique. There is moderate tortuosity and calcification of the thoracic aorta which appears stable. Chronic interstitial basilar scarring changes and possible streaky atelectasis but no definite infiltrates, effusions or edema. IMPRESSION: Chronic interstitial basilar scarring changes and possible streaky atelectasis. No pulmonary edema or pleural effusions. Electronically Signed   By: MYRTIS Stammer M.D.   On: 09/06/2024 14:31    EKG: Independently reviewed.  Atrial flutter rate controlled.  Assessment/Plan Principal Problem:   Acute on chronic systolic CHF (congestive heart failure), NYHA class 3 (HCC)    Acute on chronic HFrEF last EF measured on August 23, 2024 during myocardial perfusion scan showed EF of less than 35%.  Myocardial perfusion scan was negative for ischemia or infarct.  Patient takes 20 mg IV Lasix  for now I kept patient on 40 mg IV every 12.  Will closely follow intake output metabolic panel.  Will consult cardiology and need to consider GMDT. Atrial flutter  rate controlled.  Check TSH.  On Eliquis . History of recurrent stroke on Eliquis  and statins. Hyperthyroidism on methimazole .  Check TSH. Chronic anemia follow CBC. Hypertension on amlodipine  and hydralazine . BPH on Flomax .  Since patient has acute on chronic HFrEF will need IV diuresis close monitoring and more than 2  midnight stay.   DVT prophylaxis: Eliquis . Code Status: Full code. Family Communication: Patient's daughter at the bedside. Disposition Plan: Monitored bed. Consults called: Cardiology. Admission status: Inpatient.

## 2024-09-06 NOTE — Telephone Encounter (Signed)
 FYI Only or Action Required?: FYI only for provider.  Patient was last seen in primary care on 08/10/2024 by Georgina Speaks, FNP.  Called Nurse Triage reporting Shortness of Breath.  Symptoms began today.  Interventions attempted: Prescription medications: Lasix .  Symptoms are: gradually worsening.  Triage Disposition: Go to ED Now (Notify PCP)  Patient/caregiver understands and will follow disposition?: Yes Call from [facility] Nurse Ida to report crackles in lungs during her assessment. Pt endorses shortness of breath at rest, worse from baseline.  [place] also reports bilateral leg swelling from foot to calf, with 7/10 pain and abdominal swelling.  Pt does take Lasix  PRN, last dose was this morning.  RN advising ED. Family is agreeable.   Copied from CRM [id number]. Topic: Clinical - Red Word Triage >> Sep 06, 2024 10:42 AM Willma SAUNDERS wrote: [facility] that prompted transfer to Nurse Triage: [place] patients home home nurse calling to advise patient has some crackles in his lungs, edema in his lower extremities and bloated abdomen. Reason for Disposition  Difficulty breathing at rest  Answer Assessment - Initial Assessment Questions 1. ONSET: When did the swelling start? (e.g., minutes, hours, days)     Edema has been ongoing for quite a while but takes PRN Lasix   2. LOCATION: What part of the leg is swollen?  Are both legs swollen or just one leg?    From foot to mid calf, bilateraling  3. SEVERITY: How bad is the swelling? (e.g., localized; mild, moderate, severe)     2+ pitting edema per [facility] nurse  4. REDNESS: Is there redness or signs of infection?     No redness  5. PAIN: Is the swelling painful to touch? If Yes, ask: How painful is it?   (Scale 1-10; mild, moderate or severe)     Left leg pain, mild to moderate 7/10 pain  6. FEVER: Do you have a fever? If Yes, ask: What is it, how was it measured, and when did it start?      No fever, 98.4  7. CAUSE: What do you  think is causing the leg swelling?     Unsure of cause  8. MEDICAL HISTORY: Do you have a history of blood clots (e.g., DVT), cancer, heart failure, kidney disease, or liver failure?     History of HF  9. RECURRENT SYMPTOM: Have you had leg swelling before? If Yes, ask: When was the last time? What happened that time?     Yes  10. OTHER SYMPTOMS: Do you have any other symptoms? (e.g., chest pain, difficulty breathing)       Crackles in lungs, abdominal swelling  Protocols used: Leg Swelling and Edema-A-AH

## 2024-09-06 NOTE — ED Triage Notes (Signed)
 Reports increased SHOB and swollen legs and hands. Hx of CHF. Home health nurse told family he had crackles. Denies CP.

## 2024-09-06 NOTE — ED Notes (Signed)
 Lasix  administered, urinal at bedside

## 2024-09-06 NOTE — ED Notes (Addendum)
 2L o2 applied for spo2 <94% Provider notified

## 2024-09-06 NOTE — Telephone Encounter (Signed)
 Called patients daughter to encourage her to take pt to ER or UC for evaluation.

## 2024-09-07 ENCOUNTER — Other Ambulatory Visit (HOSPITAL_COMMUNITY): Payer: Self-pay

## 2024-09-07 ENCOUNTER — Encounter (HOSPITAL_COMMUNITY): Payer: Self-pay | Admitting: Family Medicine

## 2024-09-07 ENCOUNTER — Telehealth (HOSPITAL_COMMUNITY): Payer: Self-pay | Admitting: Pharmacy Technician

## 2024-09-07 DIAGNOSIS — D649 Anemia, unspecified: Secondary | ICD-10-CM | POA: Diagnosis not present

## 2024-09-07 DIAGNOSIS — I5023 Acute on chronic systolic (congestive) heart failure: Secondary | ICD-10-CM | POA: Diagnosis not present

## 2024-09-07 DIAGNOSIS — E0590 Thyrotoxicosis, unspecified without thyrotoxic crisis or storm: Secondary | ICD-10-CM

## 2024-09-07 DIAGNOSIS — J9601 Acute respiratory failure with hypoxia: Secondary | ICD-10-CM

## 2024-09-07 DIAGNOSIS — I69322 Dysarthria following cerebral infarction: Secondary | ICD-10-CM

## 2024-09-07 DIAGNOSIS — N1831 Chronic kidney disease, stage 3a: Secondary | ICD-10-CM

## 2024-09-07 DIAGNOSIS — I5043 Acute on chronic combined systolic (congestive) and diastolic (congestive) heart failure: Secondary | ICD-10-CM

## 2024-09-07 LAB — CBC WITH DIFFERENTIAL/PLATELET
Abs Immature Granulocytes: 0.02 K/uL (ref 0.00–0.07)
Basophils Absolute: 0 K/uL (ref 0.0–0.1)
Basophils Relative: 0 %
Eosinophils Absolute: 0.1 K/uL (ref 0.0–0.5)
Eosinophils Relative: 1 %
HCT: 36.4 % — ABNORMAL LOW (ref 39.0–52.0)
Hemoglobin: 11.3 g/dL — ABNORMAL LOW (ref 13.0–17.0)
Immature Granulocytes: 0 %
Lymphocytes Relative: 27 %
Lymphs Abs: 1.7 K/uL (ref 0.7–4.0)
MCH: 28.6 pg (ref 26.0–34.0)
MCHC: 31 g/dL (ref 30.0–36.0)
MCV: 92.2 fL (ref 80.0–100.0)
Monocytes Absolute: 0.6 K/uL (ref 0.1–1.0)
Monocytes Relative: 10 %
Neutro Abs: 3.7 K/uL (ref 1.7–7.7)
Neutrophils Relative %: 62 %
Platelets: 181 K/uL (ref 150–400)
RBC: 3.95 MIL/uL — ABNORMAL LOW (ref 4.22–5.81)
RDW: 14.9 % (ref 11.5–15.5)
WBC: 6.1 K/uL (ref 4.0–10.5)
nRBC: 0 % (ref 0.0–0.2)

## 2024-09-07 LAB — HEPATIC FUNCTION PANEL
ALT: 16 U/L (ref 0–44)
AST: 19 U/L (ref 15–41)
Albumin: 3.2 g/dL — ABNORMAL LOW (ref 3.5–5.0)
Alkaline Phosphatase: 71 U/L (ref 38–126)
Bilirubin, Direct: 0.2 mg/dL (ref 0.0–0.2)
Indirect Bilirubin: 0.8 mg/dL (ref 0.3–0.9)
Total Bilirubin: 1 mg/dL (ref 0.0–1.2)
Total Protein: 7.8 g/dL (ref 6.5–8.1)

## 2024-09-07 LAB — IRON AND TIBC
Iron: 32 ug/dL — ABNORMAL LOW (ref 45–182)
Saturation Ratios: 9 % — ABNORMAL LOW (ref 17.9–39.5)
TIBC: 346 ug/dL (ref 250–450)
UIBC: 314 ug/dL

## 2024-09-07 LAB — TROPONIN I (HIGH SENSITIVITY)
Troponin I (High Sensitivity): 31 ng/L — ABNORMAL HIGH (ref <18)
Troponin I (High Sensitivity): 31 ng/L — ABNORMAL HIGH (ref <18)

## 2024-09-07 LAB — BASIC METABOLIC PANEL WITH GFR
Anion gap: 11 (ref 5–15)
BUN: 21 mg/dL (ref 8–23)
CO2: 25 mmol/L (ref 22–32)
Calcium: 9.3 mg/dL (ref 8.9–10.3)
Chloride: 104 mmol/L (ref 98–111)
Creatinine, Ser: 1.31 mg/dL — ABNORMAL HIGH (ref 0.61–1.24)
GFR, Estimated: 52 mL/min — ABNORMAL LOW (ref >60)
Glucose, Bld: 113 mg/dL — ABNORMAL HIGH (ref 70–99)
Potassium: 4.1 mmol/L (ref 3.5–5.1)
Sodium: 140 mmol/L (ref 135–145)

## 2024-09-07 LAB — MAGNESIUM: Magnesium: 1.8 mg/dL (ref 1.7–2.4)

## 2024-09-07 LAB — TSH: TSH: 7.531 u[IU]/mL — ABNORMAL HIGH (ref 0.350–4.500)

## 2024-09-07 MED ORDER — ACETAMINOPHEN 325 MG PO TABS
650.0000 mg | ORAL_TABLET | Freq: Four times a day (QID) | ORAL | Status: DC | PRN
  Administered 2024-09-07 – 2024-09-09 (×4): 650 mg via ORAL
  Filled 2024-09-07 (×4): qty 2

## 2024-09-07 MED ORDER — HYDRALAZINE HCL 50 MG PO TABS
50.0000 mg | ORAL_TABLET | Freq: Three times a day (TID) | ORAL | Status: DC
  Administered 2024-09-07 – 2024-09-14 (×19): 50 mg via ORAL
  Filled 2024-09-07 (×22): qty 1

## 2024-09-07 MED ORDER — MIRTAZAPINE 7.5 MG PO TABS
7.5000 mg | ORAL_TABLET | Freq: Every day | ORAL | Status: DC
  Administered 2024-09-07 – 2024-09-16 (×10): 7.5 mg via ORAL
  Filled 2024-09-07 (×10): qty 1

## 2024-09-07 MED ORDER — APIXABAN 5 MG PO TABS
5.0000 mg | ORAL_TABLET | Freq: Two times a day (BID) | ORAL | Status: DC
  Administered 2024-09-07 – 2024-09-11 (×11): 5 mg via ORAL
  Filled 2024-09-07 (×11): qty 1

## 2024-09-07 MED ORDER — TAMSULOSIN HCL 0.4 MG PO CAPS
0.8000 mg | ORAL_CAPSULE | Freq: Every day | ORAL | Status: DC
  Administered 2024-09-07 – 2024-09-16 (×10): 0.8 mg via ORAL
  Filled 2024-09-07 (×10): qty 2

## 2024-09-07 MED ORDER — ACETAMINOPHEN 650 MG RE SUPP: 650.0000 mg | Freq: Four times a day (QID) | RECTAL | Status: DC | PRN

## 2024-09-07 MED ORDER — FUROSEMIDE 10 MG/ML IJ SOLN
40.0000 mg | Freq: Two times a day (BID) | INTRAMUSCULAR | Status: DC
  Administered 2024-09-07 – 2024-09-08 (×3): 40 mg via INTRAVENOUS
  Filled 2024-09-07 (×3): qty 4

## 2024-09-07 MED ORDER — METHIMAZOLE 10 MG PO TABS
10.0000 mg | ORAL_TABLET | Freq: Every day | ORAL | Status: DC
Start: 2024-09-07 — End: 2024-09-17
  Administered 2024-09-07 – 2024-09-17 (×11): 10 mg via ORAL
  Filled 2024-09-07 (×11): qty 1

## 2024-09-07 MED ORDER — ATORVASTATIN CALCIUM 80 MG PO TABS
80.0000 mg | ORAL_TABLET | Freq: Every day | ORAL | Status: DC
  Administered 2024-09-07 – 2024-09-17 (×11): 80 mg via ORAL
  Filled 2024-09-07 (×11): qty 1

## 2024-09-07 MED ORDER — SPIRONOLACTONE 12.5 MG HALF TABLET
12.5000 mg | ORAL_TABLET | Freq: Every day | ORAL | Status: DC
Start: 2024-09-08 — End: 2024-09-09
  Administered 2024-09-08: 12.5 mg via ORAL
  Filled 2024-09-07: qty 1

## 2024-09-07 MED ORDER — ORAL CARE MOUTH RINSE: 15.0000 mL | OROMUCOSAL | Status: DC | PRN

## 2024-09-07 MED ORDER — AMLODIPINE BESYLATE 5 MG PO TABS
5.0000 mg | ORAL_TABLET | Freq: Every day | ORAL | Status: DC
  Administered 2024-09-07: 5 mg via ORAL
  Filled 2024-09-07: qty 1

## 2024-09-07 NOTE — Progress Notes (Signed)
 Heart Failure Navigator Progress Note  Assessed for Heart & Vascular TOC clinic readiness.  Patient does not meet criteria due to has a scheduled [facility] appointment on 09/15/2024. No HF TOC. .   Navigator available for reassessment of patient.   Stephane Haddock, BSN, Scientist, clinical (histocompatibility and immunogenetics) Only

## 2024-09-07 NOTE — Plan of Care (Signed)
  Problem: Education: Goal: Knowledge of General Education information will improve Description: Including pain rating scale, medication(s)/side effects and non-pharmacologic comfort measures Outcome: Progressing   Problem: Health Behavior/Discharge Planning: Goal: Ability to manage health-related needs will improve Outcome: Progressing   Problem: Clinical Measurements: Goal: Cardiovascular complication will be avoided Outcome: Progressing   Problem: Nutrition: Goal: Adequate nutrition will be maintained Outcome: Progressing   Problem: Pain Managment: Goal: General experience of comfort will improve and/or be controlled Outcome: Progressing

## 2024-09-07 NOTE — Telephone Encounter (Signed)
 Patient Product/process development scientist completed.    The patient is insured through [employer]. Patient has Medicare and is not eligible for a copay card, but may be able to apply for patient assistance or Medicare RX Payment Plan (Patient Must reach out to their plan, if eligible for payment plan), if available.    Ran test claim for sacubitril-valsartan (Entresto) 24-26 mg and the current 30 day co-pay is $0.00.  Ran test claim for Farxiga 10 mg and the current 30 day co-pay is $0.00  Ran test claim for Jardiance 10 mg and the current 30 day co-pay is $0.00  This test claim was processed through [employer]- copay amounts may vary at other pharmacies due to [employer], or as the patient moves through the different stages of their insurance plan.     Jesus Lam, CPHT Pharmacy Technician Patient Advocate Specialist Lead [facility] Patient Advocate Team Direct Number: [phone]  Fax: [phone]

## 2024-09-07 NOTE — Plan of Care (Signed)
  Problem: Education: Goal: Knowledge of General Education information will improve Description: Including pain rating scale, medication(s)/side effects and non-pharmacologic comfort measures 09/07/2024 1210 by Blinda Georges BIRCH, RN Outcome: Progressing 09/07/2024 1010 by Blinda Georges BIRCH, RN Outcome: Progressing   Problem: Health Behavior/Discharge Planning: Goal: Ability to manage health-related needs will improve Outcome: Progressing   Problem: Clinical Measurements: Goal: Cardiovascular complication will be avoided Outcome: Progressing   Problem: Activity: Goal: Risk for activity intolerance will decrease Outcome: Progressing   Problem: Nutrition: Goal: Adequate nutrition will be maintained 09/07/2024 1210 by Blinda Georges BIRCH, RN Outcome: Progressing 09/07/2024 1010 by Blinda Georges BIRCH, RN Outcome: Progressing   Problem: Pain Managment: Goal: General experience of comfort will improve and/or be controlled 09/07/2024 1210 by Blinda Georges BIRCH, RN Outcome: Progressing 09/07/2024 1010 by Blinda Georges BIRCH, RN Outcome: Progressing

## 2024-09-07 NOTE — Assessment & Plan Note (Addendum)
 09/10/24 stable. On Eliquis .

## 2024-09-07 NOTE — TOC CM/SW Note (Signed)
 Transition of Care [facility]) - Inpatient Brief Assessment   Patient Details  Name: Jesus Lam MRN: [id number] Date of Birth: 1932/12/29  [facility] CM/SW Contact:    Lauraine FORBES Saa, LCSWA Phone Number: 09/07/2024, 8:54 AM   Clinical Narrative:  8:54 AM Per chart review, patient resides at home alone. Patient has a PCP and insurance. Patient has SNF history with [facility]. Patient is currently active with [facility]. Patient also has HH history with [facility], [facility], [facility], and [facility]. Patient has DME (BSC, RW) history with [facility]. Patient's preferred pharmacy's are [facility] [place] and [facility]. TOC consult was placed for [facility] screen and SNF Placement. TOC will continue to follow.  Transition of Care Asessment: Insurance and Status: Insurance coverage has been reviewed Patient has primary care physician: Yes Home environment has been reviewed: Private Residence Prior level of function:: N/A Prior/Current Home Services: Current home services [facility]) Social Drivers of Health Review: SDOH reviewed no interventions necessary Readmission risk has been reviewed: Yes (Currently Yellow 16%) Transition of care needs: transition of care needs identified, TOC will continue to follow

## 2024-09-07 NOTE — Assessment & Plan Note (Addendum)
 TSH elevated 7.531 Takes tapazole  10 mg daily.  09/10/24 continue tapazole 

## 2024-09-07 NOTE — Subjective & Objective (Signed)
 Pt seen and examined. Met with pt and dtr [name]. Pt has been consuming much more than 60 ounces of fluid per day. Also eating excessive amounts of salt. Pt was living with another dtr but recently moved in with current dtr [name]. Dtr states pt's hand edema has much improved with diuresis overnight. Pt is breathing better. Confirms pt is not on home O2.

## 2024-09-07 NOTE — Assessment & Plan Note (Addendum)
 10/17 remains on 2 L/min [place] O2 with sats 91-96%% --wean O2 as tolerated

## 2024-09-07 NOTE — Assessment & Plan Note (Addendum)
 Iron low 32, sat ratio 9% 10/16  Hbg stable 11.3 --Monitor CBC

## 2024-09-07 NOTE — Assessment & Plan Note (Addendum)
 10/17 /25 BP's controlled, soft at times. Continue hydralazine . Holding amlodipine , lasix , aldactone.  Resume other GDMT as BP tolerates, per Cardiology

## 2024-09-07 NOTE — Assessment & Plan Note (Addendum)
 09/07/24 chronic.

## 2024-09-07 NOTE — Assessment & Plan Note (Addendum)
 09/10/24 baseline scr 1.1-1.3.  Cr bumped up 1.39 >> 1.72  >> 1.83 today --monitor renal function  --holding Lasix  and Aldactone for now

## 2024-09-07 NOTE — Assessment & Plan Note (Addendum)
 Body mass index is 34.09 kg/m.

## 2024-09-07 NOTE — Assessment & Plan Note (Addendum)
 Due to combination of excessive fluid intake and excessive salt intake. Pt already feeling better. Pt's dtr [name] agrees. Less hand edema. Pt does weigh himself everyday. Pt now living with [facility]. She states she does not use cook with any salt in his food. Discussed that pt is limited to 60 ounces per day of ALL fluids. Not just water. ALL fluids meaning anything he can drink, including juice, coffee, soups,etc.  10/15 Transitioned from IV to PO Lasix  per Cardiology today 10/16, 10/17 Hold Lasix  with Cr rising --Monitor renal function & electrolytes closely  Weight Information (since admission)     Date/Time Weight Weight in lbs BSA (Calculated - sq m) BMI (Calculated) Who   09/07/24 0133 114 kg 251.32 lbs 2.41 sq meters 34.08 ML

## 2024-09-07 NOTE — Consult Note (Signed)
 Cardiology Consultation   Patient ID: Jesus Lam MRN: [id number]; DOB: 01-04-1933  Admit date: 09/06/2024 Date of Consult: 09/07/2024  PCP:  Georgina Speaks, FNP   [facility] Providers Cardiologist:  Newman JINNY Lawrence, MD    Patient Profile: Jesus Lam is a 89 y.o. male with a hx of HTN, HLD, PAD, h/o DVT, recurrent stroke, HFrEF, persistent atrial flutter who is being seen 09/07/2024 for the evaluation of CHF at the request of Dr. Laurence.  History of Present Illness: Jesus Lam is an 89 yo male with PMH noted above. He follows with Dr. Lawrence as an outpatient. Cardiac cath 12/2017 with non-obstructive CAD and known hx of severe bilateral PAD with class III claudication s/p DCB to RT SFA and overlapping stents to the L SFA at that time. Echo 05/2018 with LVEF of 25-30%, g1DD, mild AR, moderately dilated LA. Last seen in the office on 08/2023 and reported mild calf tenderness. Sent for outpatient dopplers which where positive for left DVT. He was already on Eliquis .   Admitted 02/2024 with expressive and receptive aphasia. CT of the head and neck showed no emergent large vessel occlusion but did reveal completed infarct in the superior left parotid cortex with acute left distal MCA branch infarction. He was deemed not a candidate for TNK or thrombectomy and permissive hypertension was allowed. Noted have HR in the 30s with 2 and 3 second pauses and cardiology evaluated with recommendations for outpatient monitor at discharge. Echo showed LVEF of 45-50%, global hypokinesis with moderate LVH and g1DD. Bisoprolol  and aricept  were held with improvement. TSH was suppressed with elevated free T4 and referred to endocrinology. 14d monitor showed atrial flutter 100%, ranging from 35-134bpm, no high grade AVB noted.   Seen in the ED 05/2024 with worsening aphasia and dysarthria, evaluated by neurology. Recommendations for MRI brain which showed no new intracranial abnormality.  Last  seen in the office on 06/2024 with Dr. Lawrence with discussion related to possible ablation. Also ordered for echo and lexiscan myoview for c/o retrosternal burning. Lexiscan 9/29 showed no ischemia but high risk in the setting of LVEF <35%.   Presented to the ED on 10/13 with complaints of worsening shortness of breath and edema for the past 4 days. Says his weight has been up by at least 4lbs. Moved in with his daughter, [name] who helps with his care. Also noted swelling to his hands. Reports compliance with his medications including lasix  and Eliquis .   In the ED labs showed Na+ 143, K+ 4.3, Cr 1.2, mag 1.9, BNP 846, hsTn 31>31, WBC 5.8, Hgb 11.1, TSH 7.531. CXR with interstitial basilar scarring and atelectasis. EKG 2:1 atrial flutter rate 77bpm. Given IV lasix  20mg  x1 then 40mg  x1. Admitted to IM for further management. Cardiology asked to evaluate in regards to CHF.   Past Medical History:  Diagnosis Date   Anemia    BPH (benign prostatic hyperplasia)    Cataracts, bilateral    CVA (cerebral vascular accident) (HCC)    CVA (cerebrovascular accident due to intracerebral hemorrhage) (HCC) 03/22/2024   Deep venous thrombosis (DVT) of left peroneal vein (HCC) 09/24/2023   DVT (deep venous thrombosis) (HCC)    dvt in left leg   Dyslipidemia    Dysuria 06/17/2023   Gout    Gout    HTN (hypertension)    Ischemic cerebrovascular accident (CVA) (HCC) 06/13/2018   Left hip pain 03/15/2020   Left leg pain    Lower abdominal pain 11/03/2023  Osteoarthritis    PAD (peripheral artery disease)    Stasis dermatitis    Stroke [facility])    Vitamin D  deficiency     Past Surgical History:  Procedure Laterality Date   ABDOMINAL AORTOGRAM N/A 01/13/2018   Procedure: ABDOMINAL AORTOGRAM;  Surgeon: Elmira Newman PARAS, MD;  Location: [facility] INVASIVE CV LAB;  Service: Cardiovascular;  Laterality: N/A;   CATARACT EXTRACTION, BILATERAL     LEFT HEART CATH AND CORONARY ANGIOGRAPHY N/A 01/13/2018    Procedure: LEFT HEART CATH AND CORONARY ANGIOGRAPHY;  Surgeon: Elmira Newman PARAS, MD;  Location: [facility] INVASIVE CV LAB;  Service: Cardiovascular;  Laterality: N/A;   LOWER EXTREMITY ANGIOGRAPHY N/A 01/13/2018   Procedure: LOWER EXTREMITY ANGIOGRAPHY;  Surgeon: Elmira Newman PARAS, MD;  Location: [facility] INVASIVE CV LAB;  Service: Cardiovascular;  Laterality: N/A;   LOWER EXTREMITY ANGIOGRAPHY Right 01/27/2018   Procedure: LOWER EXTREMITY ANGIOGRAPHY;  Surgeon: Ladona Heinz, MD;  Location: [facility] INVASIVE CV [facility];  Service: Cardiovascular;  Laterality: Right;   PERIPHERAL VASCULAR ATHERECTOMY Left 01/13/2018   Procedure: PERIPHERAL VASCULAR ATHERECTOMY;  Surgeon: Elmira Newman PARAS, MD;  Location: [facility];  Service: Cardiovascular;  Laterality: Left;  SFA WITH PTA DRUG COATED BALLOON   PERIPHERAL VASCULAR INTERVENTION  01/27/2018   Procedure: PERIPHERAL VASCULAR INTERVENTION;  Surgeon: Ladona Heinz, MD;  Location: [facility] IN[facility];  Service: Cardiovascular;;    Scheduled Meds:  apixaban   5 mg Oral BID   atorvastatin   80 mg Oral Daily   furosemide   40 mg Intravenous Q12H   hydrALAZINE   50 mg Oral TID   methimazole   10 mg Oral Daily   mirtazapine   7.5 mg Oral QHS   [START ON 09/08/2024] spironolactone  12.5 mg Oral Daily   tamsulosin   0.8 mg Oral QPC supper   Continuous Infusions:  PRN Meds: acetaminophen  **OR** acetaminophen , mouth rinse  Allergies:    Allergies  Allergen Reactions   Porcine (Pork) Protein-Containing Drug Products Other (See Comments)    Religious    Shellfish Allergy Other (See Comments)    Gout     Social History:   Social History   Socioeconomic History   Marital status: Single    Spouse name: Not on file   Number of children: 3   Years of education: Not on file   Highest education level: Not on file  Occupational History   Occupation: retired  Tobacco Use   Smoking status: Former    Current packs/day: 0.00    Average packs/day: 0.3 packs/day for 0.5  years (0.1 ttl pk-yrs)    Types: Cigarettes    Start date: 05/1978    Quit date: 1980    Years since quitting: 45.8   Smokeless tobacco: Never  Vaping Use   Vaping status: Never Used  Substance and Sexual Activity   Alcohol use: Not Currently    Alcohol/week: 6.0 - 7.0 standard drinks of alcohol    Types: 6 - 7 Shots of liquor per week    Comment: on occasion   Drug use: Not Currently    Types: Marijuana    Comment: last used x 2 hours ago   Sexual activity: Yes  Other Topics Concern   Not on file  Social History Narrative   ** Merged History Encounter **       Lives with daughter, [name]. 2 daughters live here in [place]. Son lives in [place]. 3/16- offered information on local food pantries as well as congregate meal sites. Information declined.   Social  Drivers of Health   Financial Resource Strain: Low Risk  (06/16/2024)   Overall Financial Resource Strain (CARDIA)    Difficulty of Paying Living Expenses: Not hard at all  Food Insecurity: No Food Insecurity (09/07/2024)   Hunger Vital Sign    Worried About Running Out of Food in the Last Year: Never true    Ran Out of Food in the Last Year: Never true  Transportation Needs: No Transportation Needs (09/07/2024)   PRAPARE - Administrator, Civil Service (Medical): No    Lack of Transportation (Non-Medical): No  Physical Activity: Inactive (06/16/2024)   Exercise Vital Sign    Days of Exercise per Week: 0 days    Minutes of Exercise per Session: 0 min  Stress: No Stress Concern Present (06/16/2024)   [employer] of Occupational Health - Occupational Stress Questionnaire    Feeling of Stress: Not at all  Social Connections: Patient Declined (09/07/2024)   Social Connection and Isolation Panel    Frequency of Communication with Friends and Family: Patient declined    Frequency of Social Gatherings with Friends and Family: Patient declined    Attends Religious Services: Patient declined    Automotive engineer or Organizations: Patient declined    Attends Banker Meetings: Patient declined    Marital Status: Patient declined  Recent Concern: Social Connections - Socially Isolated (06/16/2024)   Social Connection and Isolation Panel    Frequency of Communication with Friends and Family: More than three times a week    Frequency of Social Gatherings with Friends and Family: More than three times a week    Attends Religious Services: Never    Database administrator or Organizations: No    Attends Banker Meetings: Never    Marital Status: Widowed  Intimate Partner Violence: Not At Risk (09/07/2024)   Humiliation, Afraid, Rape, and Kick questionnaire    Fear of Current or Ex-Partner: No    Emotionally Abused: No    Physically Abused: No    Sexually Abused: No    Family History:    Family History  Problem Relation Age of Onset   Cancer Mother    Cancer Father    Alcohol abuse Father    Breast cancer Daughter    Stroke Daughter    CVA Daughter      ROS:  Please see the history of present illness.   All other ROS reviewed and negative.     Physical Exam/Data: Vitals:   09/07/24 0358 09/07/24 0716 09/07/24 0946 09/07/24 1200  BP: 134/66 (!) 140/75 105/82 123/80  Pulse: 74 77  77  Resp: 20 15  18   Temp: 98.6 F (37 C) 98.5 F (36.9 C)  97.6 F (36.4 C)  TempSrc: Oral Oral  Oral  SpO2: 94% 95%  96%  Weight:      Height:        Intake/Output Summary (Last 24 hours) at 09/07/2024 1447 Last data filed at 09/07/2024 1223 Gross per 24 hour  Intake 1020 ml  Output 4075 ml  Net -3055 ml      09/07/2024    1:33 AM 08/23/2024   10:56 AM 08/10/2024    3:52 PM  Last 3 Weights  Weight (lbs) 251 lb 5.2 oz 246 lb 248 lb  Weight (kg) 114 kg 111.585 kg 112.492 kg     Body mass index is 34.09 kg/m.  General:  Well nourished, well developed, in no acute distress. [place] @  3L HEENT: normal Neck: + JVD to jaw  Vascular: No carotid bruits; Distal  pulses 2+ bilaterally Cardiac:  normal S1, S2; Irreg Irreg; no murmur  Lungs:  diminished in bases  Abd: soft, nontender, no hepatomegaly  Ext: no edema Musculoskeletal:  No deformities, BUE and BLE strength normal and equal Skin: warm and dry  Neuro: no focal abnormalities noted Psych:  Normal affect   EKG:  The EKG was personally reviewed and demonstrates: 2:1 atrial flutter  Telemetry:  Telemetry was personally reviewed and demonstrates:  Rate controlled atrial flutter mostly in the 60-70 range  Relevant CV Studies:  Echo: 02/2024  IMPRESSIONS     1. Left ventricular ejection fraction, by estimation, is 45 to 50%. The  left ventricle has mildly decreased function. The left ventricle  demonstrates global hypokinesis. There is moderate concentric left  ventricular hypertrophy. Left ventricular  diastolic parameters are consistent with Grade I diastolic dysfunction  (impaired relaxation).   2. Right ventricular systolic function is normal. The right ventricular  size is normal. There is normal pulmonary artery systolic pressure.   3. Left atrial size was moderately dilated.   4. The mitral valve is normal in structure. Trivial mitral valve  regurgitation. No evidence of mitral stenosis.   5. The aortic valve is calcified. There is mild calcification of the  aortic valve. There is mild thickening of the aortic valve. Aortic valve  regurgitation is mild to moderate. Mild aortic valve stenosis. Aortic  valve area, by VTI measures 1.41 cm.  Aortic valve mean gradient measures 12.0 mmHg. Aortic valve Vmax measures  2.51 m/s.   6. Aortic dilatation noted. There is mild dilatation of the ascending  aorta, measuring 41 mm.   7. The inferior vena cava is normal in size with greater than 50%  respiratory variability, suggesting right atrial pressure of 3 mmHg.   FINDINGS   Left Ventricle: Left ventricular ejection fraction, by estimation, is 45  to 50%. The left ventricle has mildly  decreased function. The left  ventricle demonstrates global hypokinesis. The left ventricular internal  cavity size was normal in size. There is   moderate concentric left ventricular hypertrophy. Left ventricular  diastolic parameters are consistent with Grade I diastolic dysfunction  (impaired relaxation). Indeterminate filling pressures.   Right Ventricle: The right ventricular size is normal. No increase in  right ventricular wall thickness. Right ventricular systolic function is  normal. There is normal pulmonary artery systolic pressure. The tricuspid  regurgitant velocity is 1.78 m/s, and   with an assumed right atrial pressure of 3 mmHg, the estimated right  ventricular systolic pressure is 15.7 mmHg.   Left Atrium: Left atrial size was moderately dilated.   Right Atrium: Right atrial size was normal in size.   Pericardium: There is no evidence of pericardial effusion.   Mitral Valve: The mitral valve is normal in structure. Trivial mitral  valve regurgitation. No evidence of mitral valve stenosis.   Tricuspid Valve: The tricuspid valve is normal in structure. Tricuspid  valve regurgitation is trivial. No evidence of tricuspid stenosis.   Aortic Valve: The aortic valve is calcified. There is mild calcification  of the aortic valve. There is mild thickening of the aortic valve. Aortic  valve regurgitation is mild to moderate. Mild aortic stenosis is present.  Aortic valve mean gradient  measures 12.0 mmHg. Aortic valve peak gradient measures 25.2 mmHg. Aortic  valve area, by VTI measures 1.41 cm.   Pulmonic Valve: The pulmonic valve was normal  in structure. Pulmonic valve  regurgitation is not visualized. No evidence of pulmonic stenosis.   Aorta: Aortic dilatation noted. There is mild dilatation of the ascending  aorta, measuring 41 mm.   Venous: The inferior vena cava is normal in size with greater than 50%  respiratory variability, suggesting right atrial pressure  of 3 mmHg.   IAS/Shunts: No atrial level shunt detected by color flow Doppler.    Laboratory Data: High Sensitivity Troponin:   Recent Labs  Lab 09/07/24 0330 09/07/24 0629  TROPONINIHS 31* 31*     Chemistry Recent Labs  Lab 09/06/24 1414 09/06/24 1428 09/07/24 0330  NA 143  --  140  K 4.3  --  4.1  CL 107  --  104  CO2 26  --  25  GLUCOSE 87  --  113*  BUN 21  --  21  CREATININE 1.22  --  1.31*  CALCIUM  9.9  --  9.3  MG  --  1.9 1.8  GFRNONAA 57*  --  52*  ANIONGAP 10  --  11    Recent Labs  Lab 09/06/24 1414 09/07/24 0330  PROT 7.9 7.8  ALBUMIN 3.8 3.2*  AST 20 19  ALT 13 16  ALKPHOS 90 71  BILITOT 0.7 1.0   Lipids No results for input(s): CHOL, TRIG, HDL, LABVLDL, LDLCALC, CHOLHDL in the last 168 hours.  Hematology Recent Labs  Lab 09/06/24 1414 09/07/24 0629  WBC 5.8 6.1  RBC 3.89* 3.95*  HGB 11.1* 11.3*  HCT 36.2* 36.4*  MCV 93.1 92.2  MCH 28.5 28.6  MCHC 30.7 31.0  RDW 14.9 14.9  PLT 179 181   Thyroid   Recent Labs  Lab 09/07/24 0330  TSH 7.531*    BNP Recent Labs  Lab 09/06/24 1414  PROBNP 846.0*    DDimer No results for input(s): DDIMER in the last 168 hours.  Radiology/Studies:  DG Chest Port 1 View Result Date: 09/06/2024 CLINICAL DATA:  Shortness of breath and leg swelling. EXAM: PORTABLE CHEST 1 VIEW COMPARISON:  08/06/2023 FINDINGS: The heart is within normal limits in size given the AP projection and portable technique. There is moderate tortuosity and calcification of the thoracic aorta which appears stable. Chronic interstitial basilar scarring changes and possible streaky atelectasis but no definite infiltrates, effusions or edema. IMPRESSION: Chronic interstitial basilar scarring changes and possible streaky atelectasis. No pulmonary edema or pleural effusions. Electronically Signed   By: MYRTIS Stammer M.D.   On: 09/06/2024 14:31     Assessment and Plan:  Ediberto Sens is a 89 y.o. male with a hx of HTN,  HLD, PAD, h/o DVT, recurrent stroke, HFrEF, persistent atrial flutter who is being seen 09/07/2024 for the evaluation of CHF at the request of Dr. Laurence.  Acute on Chronic HFrEF -- echo 02/2024 with LVEF of 45-50%, normal RV -- presented with several days of shortness of breath and LE edema. Hypoxic on admission requiring O2. Says he avoids salt but fluid intake has been high -- BNP 848, CXR with interstitial basilar scarring and atelectasis -- s/p IV lasix , net - 3L thus far and feeling better though remains on [place] -- recent outpatient LVEF <35% on lexiscan myoview -- repeat echo  -- GDMT: BB stopped in the past 2/2 bradycardia/pauses. Will switch norvasc  to spiro 12.5mg  daily. Potentially add ARB vs ARNi pending BP/Cr  Persistent atrial flutter -- long hx of the same, as above no BB in the setting of bradycardia. Appears he has mostly been rate  controlled for quite some time -- on Eliquis  5mg  BID   -- has been referred to EP outpatient, options are limited but suspect he may not be an ablation candidate   Hyperthyroidism -- follows with endocrinology outpatient on methimazole  -- TSH now 7.5 on admission -- per primary team   Hx of recurrent strokes PAD Hx of DVT    Risk Assessment/Risk Scores:  New [place]  Heart Association (NYHA) Functional Class NYHA Class II  CHA2DS2-VASc Score = 7  This indicates a 11.2% annual risk of stroke. The patient's score is based upon: CHF History: 1 HTN History: 1 Diabetes History: 0 Stroke History: 2 Vascular Disease History: 1 Age Score: 2 Gender Score: 0   For questions or updates, please contact [facility] Please consult [email or web address] for contact info under   Signed, Manuelita Rummer, NP  09/07/2024 2:47 PM

## 2024-09-07 NOTE — Progress Notes (Signed)
 PROGRESS NOTE    Jesus Lam  FMW:[id number] DOB: Apr 14, 1933 DOA: 09/06/2024 PCP: Georgina Speaks, FNP  Subjective: Pt seen and examined. Met with pt and dtr [name]. Pt has been consuming much more than 60 ounces of fluid per day. Also eating excessive amounts of salt. Pt was living with another dtr but recently moved in with current dtr [name]. Dtr states pt's hand edema has much improved with diuresis overnight. Pt is breathing better. Confirms pt is not on home O2.   Hospital Course: CC: SOB, swelling hand and legs HPI: Jesus Lam is a 89 y.o. male with history of chronic HFrEF last EF measured was on August 23, 2024 when patient had myocardial perfusion scan which was negative for ischemia or infarct but did show EF of less than 35% with history of recurrent stroke prior history of DVT hypertension and recent diagnosis of atrial flutter presents to the ER with complaints of increasing shortness of breath and peripheral edema worsening over the last 2 weeks.  Denies any chest pain productive cough fever chills.  Patient states he has been compliant with his Lasix  20 mg daily.  He also has been more cautious about his diet recently.   ED Course: In the ER patient's EKG was showing atrial flutter controlled rate.  Chest x-ray was unremarkable.  proBNP was 846.  On exam patient has significant edema was given 20 mg IV Lasix  and admitted for further workup.  On exam patient has at least 3+ edema on both lower extremities and patient also feels his abdomen is getting distended.  He is on 2 L oxygen.  Since getting 20 mg IV Lasix  patient has put at least 500 cc out.  Admitted for CHF exacerbation.  Significant Events: Admitted 09/06/2024 for acute on chronic systolic CHF   Admission Labs: WBC 5.8, HgB 11.1, plt 149 Na 143, K 4.3, CO2 of 26, BUN 21, Scr 1.22, glu 87 T. Prot 7.9, alb 3.8, AST 20, ALT 13, alk phos 90, t. Bili 0.7 Pro BNP 846 Covid/rsv/flu negative Mg 1.9 TSH  7.531  Admission Imaging Studies: CXR Chronic interstitial basilar scarring changes and possible streaky atelectasis. No pulmonary edema or pleural effusions.  Significant Labs:   Significant Imaging Studies:   Antibiotic Therapy: Anti-infectives (From admission, onward)    None       Procedures:   Consultants: cardiology    Assessment and Plan: * Acute on chronic systolic CHF (congestive heart failure), NYHA class 3 (HCC) 09/07/24 due to combination of excessive fluid intake and excessive salt intake. Pt already feeling better. Pt's dtr [name] agrees. Less hand edema. Pt does weigh himself everyday. Pt now living with [facility]. She states she does not use cook with any salt in his food. Discussed that pt is limited to 60 ounces per day of ALL fluids. Not just water. ALL fluids meaning anything he can drink, including juice, coffee, soups,etc. Remains on IV lasix  40 mg q12. Awaiting cards to see him. Wean O2. Hopefully home tomorrow. 2.5 liters in urine output yesterday.  Weight Information (since admission)     Date/Time Weight Weight in lbs BSA (Calculated - sq m) BMI (Calculated) Who   09/07/24 0133 114 kg 251.32 lbs 2.41 sq meters 34.08 ML       Acute respiratory failure with hypoxia (HCC) 09/07/24 RA sats were less than 94% in [facility]. Placed on supplemental O2. Wean to RA. Pt not on home O2.   Obesity, Class I, BMI 30-34.9 09/07/24 Body mass  index is 34.09 kg/m.    Typical atrial flutter (HCC) 09/07/24 stable. On Eliquis .   CKD stage 3a, GFR 45-59 ml/min (HCC) - baseline Scr 1.1-1.3 09/07/24 baseline scr 1.1-1.3. monitor renal function with diuresis.   Hyperthyroidism 09/07/24 on tapazole  10 mg daily.   Dysarthria as late effect of cerebellar cerebrovascular accident (CVA) 09/07/24 chronic.   Anemia 09/07/24 check iron studies.   Essential hypertension 09/07/24 continue with norvasc , lasix , hydralazine    DVT prophylaxis:  apixaban   (ELIQUIS ) tablet 5 mg     Code Status: Full Code Family Communication: discussed at length with pt's dtr regina at bedside Disposition Plan: return home Reason for continuing need for hospitalization: remains on IV lasix .  Objective: Vitals:   09/07/24 0358 09/07/24 0716 09/07/24 0946 09/07/24 1200  BP: 134/66 (!) 140/75 105/82   Pulse: 74 77    Resp: 20 15    Temp: 98.6 F (37 C) 98.5 F (36.9 C)  97.6 F (36.4 C)  TempSrc: Oral Oral  Oral  SpO2: 94% 95%    Weight:      Height:        Intake/Output Summary (Last 24 hours) at 09/07/2024 1406 Last data filed at 09/07/2024 1223 Gross per 24 hour  Intake 1020 ml  Output 4075 ml  Net -3055 ml   Filed Weights   09/07/24 0133  Weight: 114 kg    Examination:  Physical Exam Vitals and nursing note reviewed.  Constitutional:      Appearance: He is obese.  HENT:     Head: Normocephalic and atraumatic.  Eyes:     General: No scleral icterus. Cardiovascular:     Rate and Rhythm: Normal rate. Rhythm irregular.     Comments: Atrial flutter in telemetry Pulmonary:     Effort: Pulmonary effort is normal. No respiratory distress.     Breath sounds: No wheezing or rales.  Abdominal:     General: Bowel sounds are normal.     Palpations: Abdomen is soft.  Musculoskeletal:     Right lower leg: Edema present.     Left lower leg: Edema present.     Comments: Trace to +1 pitting pretibial/ankle/pedal edema Trace edema bilateral upper thighs.  Skin:    General: Skin is warm and dry.     Capillary Refill: Capillary refill takes less than 2 seconds.  Neurological:     Mental Status: He is alert and oriented to person, place, and time.     Comments: Mild dysarthria     Data Reviewed: I have personally reviewed following labs and imaging studies  CBC: Recent Labs  Lab 09/06/24 1414 09/07/24 0629  WBC 5.8 6.1  NEUTROABS 2.9 3.7  HGB 11.1* 11.3*  HCT 36.2* 36.4*  MCV 93.1 92.2  PLT 179 181   Basic Metabolic  Panel: Recent Labs  Lab 09/06/24 1414 09/06/24 1428 09/07/24 0330  NA 143  --  140  K 4.3  --  4.1  CL 107  --  104  CO2 26  --  25  GLUCOSE 87  --  113*  BUN 21  --  21  CREATININE 1.22  --  1.31*  CALCIUM  9.9  --  9.3  MG  --  1.9 1.8   GFR: Estimated Creatinine Clearance: 49.9 mL/min (A) (by C-G formula based on SCr of 1.31 mg/dL (H)). Liver Function Tests: Recent Labs  Lab 09/06/24 1414 09/07/24 0330  AST 20 19  ALT 13 16  ALKPHOS 90 71  BILITOT 0.7  1.0  PROT 7.9 7.8  ALBUMIN 3.8 3.2*   Thyroid  Function Tests: Recent Labs    09/07/24 0330  TSH 7.531*    Recent Results (from the past 240 hours)  Resp panel by RT-PCR (RSV, Flu A&B, Covid) Anterior Nasal Swab     Status: None   Collection Time: 09/06/24  2:14 PM   Specimen: Anterior Nasal Swab  Result Value Ref Range Status   SARS Coronavirus 2 by RT PCR NEGATIVE NEGATIVE Final    Comment: (NOTE) SARS-CoV-2 target nucleic acids are NOT DETECTED.  The SARS-CoV-2 RNA is generally detectable in upper respiratory specimens during the acute phase of infection. The lowest concentration of SARS-CoV-2 viral copies this assay can detect is 138 copies/mL. A negative result does not preclude SARS-Cov-2 infection and should not be used as the sole basis for treatment or other patient management decisions. A negative result may occur with  improper specimen collection/handling, submission of specimen other than nasopharyngeal swab, presence of viral mutation(s) within the areas targeted by this assay, and inadequate number of viral copies(<138 copies/mL). A negative result must be combined with clinical observations, patient history, and epidemiological information. The expected result is Negative.  Fact Sheet for Patients:  BloggerCourse.com  Fact Sheet for Healthcare Providers:  SeriousBroker.it  This test is no t yet approved or cleared by the [place]  FDA  and  has been authorized for detection and/or diagnosis of SARS-CoV-2 by FDA under an Emergency Use Authorization (EUA). This EUA will remain  in effect (meaning this test can be used) for the duration of the COVID-19 declaration under Section 564(b)(1) of the Act, 21 U.S.C.section 360bbb-3(b)(1), unless the authorization is terminated  or revoked sooner.       Influenza A by PCR NEGATIVE NEGATIVE Final   Influenza B by PCR NEGATIVE NEGATIVE Final    Comment: (NOTE) The Xpert Xpress SARS-CoV-2/FLU/RSV plus assay is intended as an aid in the diagnosis of influenza from Nasopharyngeal swab specimens and should not be used as a sole basis for treatment. Nasal washings and aspirates are unacceptable for Xpert Xpress SARS-CoV-2/FLU/RSV testing.  Fact Sheet for Patients: BloggerCourse.com  Fact Sheet for Healthcare Providers: SeriousBroker.it  This test is not yet approved or cleared by the [place]  FDA and has been authorized for detection and/or diagnosis of SARS-CoV-2 by FDA under an Emergency Use Authorization (EUA). This EUA will remain in effect (meaning this test can be used) for the duration of the COVID-19 declaration under Section 564(b)(1) of the Act, 21 U.S.C. section 360bbb-3(b)(1), unless the authorization is terminated or revoked.     Resp Syncytial Virus by PCR NEGATIVE NEGATIVE Final    Comment: (NOTE) Fact Sheet for Patients: BloggerCourse.com  Fact Sheet for Healthcare Providers: SeriousBroker.it  This test is not yet approved or cleared by the [place]  FDA and has been authorized for detection and/or diagnosis of SARS-CoV-2 by FDA under an Emergency Use Authorization (EUA). This EUA will remain in effect (meaning this test can be used) for the duration of the COVID-19 declaration under Section 564(b)(1) of the Act, 21 U.S.C. section 360bbb-3(b)(1),  unless the authorization is terminated or revoked.  Performed at [facility], [address] [place], [place] [address]   MRSA Next Gen by PCR, Nasal     Status: None   Collection Time: 09/06/24 10:05 PM   Specimen: Nasal Mucosa; Nasal Swab  Result Value Ref Range Status   MRSA by PCR Next Gen NOT DETECTED NOT DETECTED Final  Comment: (NOTE) The GeneXpert MRSA Assay (FDA approved for NASAL specimens only), is one component of a comprehensive MRSA colonization surveillance program. It is not intended to diagnose MRSA infection nor to guide or monitor treatment for MRSA infections. Test performance is not FDA approved in patients less than 67 years old. Performed at [facility], [address], [place], [place] [address]      Radiology Studies: DG Chest Port 1 View Result Date: 09/06/2024 CLINICAL DATA:  Shortness of breath and leg swelling. EXAM: PORTABLE CHEST 1 VIEW COMPARISON:  08/06/2023 FINDINGS: The heart is within normal limits in size given the AP projection and portable technique. There is moderate tortuosity and calcification of the thoracic aorta which appears stable. Chronic interstitial basilar scarring changes and possible streaky atelectasis but no definite infiltrates, effusions or edema. IMPRESSION: Chronic interstitial basilar scarring changes and possible streaky atelectasis. No pulmonary edema or pleural effusions. Electronically Signed   By: MYRTIS Stammer M.D.   On: 09/06/2024 14:31    Scheduled Meds:  amLODipine   5 mg Oral Daily   apixaban   5 mg Oral BID   atorvastatin   80 mg Oral Daily   furosemide   40 mg Intravenous Q12H   hydrALAZINE   50 mg Oral TID   methimazole   10 mg Oral Daily   mirtazapine   7.5 mg Oral QHS   tamsulosin   0.8 mg Oral QPC supper   Continuous Infusions:   LOS: 1 day   Time spent: 60 minutes  Camellia Door, DO  [facility]  09/07/2024, 2:06 PM

## 2024-09-07 NOTE — Hospital Course (Addendum)
 89 y.o. male with history of chronic HFrEF last EF measured was on August 23, 2024 when patient had myocardial perfusion scan which was negative for ischemia or infarct but did show EF of less than 35% with history of recurrent stroke prior history of DVT hypertension and recent diagnosis of atrial flutter presents to the ER with complaints of increasing shortness of breath and peripheral edema worsening over the last 2 weeks.  Denies any chest pain productive cough fever chills.  Patient states he has been compliant with his Lasix  20 mg daily.  He also has been more cautious about his diet recently.   ED Course: In the ER patient's EKG was showing atrial flutter controlled rate.  Chest x-ray was unremarkable.  proBNP was 846.  On exam patient has significant edema was given 20 mg IV Lasix  and admitted for further workup.  On exam patient has at least 3+ edema on both lower extremities and patient also feels his abdomen is getting distended.  He is on 2 L oxygen.  Since getting 20 mg IV Lasix  patient has put at least 500 cc out.  Admitted for CHF exacerbation.

## 2024-09-08 ENCOUNTER — Inpatient Hospital Stay (HOSPITAL_COMMUNITY)

## 2024-09-08 DIAGNOSIS — J9601 Acute respiratory failure with hypoxia: Secondary | ICD-10-CM | POA: Diagnosis not present

## 2024-09-08 DIAGNOSIS — I483 Typical atrial flutter: Secondary | ICD-10-CM | POA: Diagnosis not present

## 2024-09-08 DIAGNOSIS — I5021 Acute systolic (congestive) heart failure: Secondary | ICD-10-CM | POA: Diagnosis not present

## 2024-09-08 DIAGNOSIS — I10 Essential (primary) hypertension: Secondary | ICD-10-CM | POA: Diagnosis not present

## 2024-09-08 DIAGNOSIS — N1831 Chronic kidney disease, stage 3a: Secondary | ICD-10-CM | POA: Diagnosis not present

## 2024-09-08 DIAGNOSIS — I5023 Acute on chronic systolic (congestive) heart failure: Secondary | ICD-10-CM | POA: Diagnosis not present

## 2024-09-08 LAB — ECHOCARDIOGRAM COMPLETE
AR max vel: 2.23 cm2
AV Area VTI: 2.68 cm2
AV Area mean vel: 2.25 cm2
AV Mean grad: 8 mmHg
AV Peak grad: 15.8 mmHg
Ao pk vel: 1.99 m/s
Area-P 1/2: 5.97 cm2
Calc EF: 43.9 %
Height: 72 in
MV M vel: 4.52 m/s
MV Peak grad: 81.7 mmHg
MV VTI: 4.71 cm2
P 1/2 time: 402 ms
S' Lateral: 4.9 cm
Single Plane A2C EF: 50.9 %
Single Plane A4C EF: 40 %
Weight: 3971.81 [oz_av]

## 2024-09-08 LAB — BASIC METABOLIC PANEL WITH GFR
Anion gap: 11 (ref 5–15)
BUN: 23 mg/dL (ref 8–23)
CO2: 26 mmol/L (ref 22–32)
Calcium: 8.9 mg/dL (ref 8.9–10.3)
Chloride: 101 mmol/L (ref 98–111)
Creatinine, Ser: 1.39 mg/dL — ABNORMAL HIGH (ref 0.61–1.24)
GFR, Estimated: 48 mL/min — ABNORMAL LOW (ref >60)
Glucose, Bld: 107 mg/dL — ABNORMAL HIGH (ref 70–99)
Potassium: 3.9 mmol/L (ref 3.5–5.1)
Sodium: 138 mmol/L (ref 135–145)

## 2024-09-08 LAB — T4, FREE: Free T4: 0.72 ng/dL (ref 0.61–1.12)

## 2024-09-08 LAB — MAGNESIUM: Magnesium: 1.7 mg/dL (ref 1.7–2.4)

## 2024-09-08 MED ORDER — EMPAGLIFLOZIN 10 MG PO TABS
10.0000 mg | ORAL_TABLET | Freq: Every day | ORAL | Status: DC
Start: 2024-09-08 — End: 2024-09-13
  Administered 2024-09-08 – 2024-09-13 (×6): 10 mg via ORAL
  Filled 2024-09-08 (×6): qty 1

## 2024-09-08 MED ORDER — FUROSEMIDE 20 MG PO TABS
20.0000 mg | ORAL_TABLET | Freq: Every day | ORAL | Status: DC
  Administered 2024-09-08: 20 mg via ORAL
  Filled 2024-09-08: qty 1

## 2024-09-08 MED ORDER — METHOCARBAMOL 500 MG PO TABS
500.0000 mg | ORAL_TABLET | Freq: Three times a day (TID) | ORAL | Status: DC | PRN
  Administered 2024-09-08 – 2024-09-09 (×3): 500 mg via ORAL
  Filled 2024-09-08 (×3): qty 1

## 2024-09-08 MED ORDER — MAGNESIUM SULFATE 2 GM/50ML IV SOLN
2.0000 g | Freq: Once | INTRAVENOUS | Status: AC
  Administered 2024-09-08: 2 g via INTRAVENOUS
  Filled 2024-09-08: qty 50

## 2024-09-08 MED ORDER — SACUBITRIL-VALSARTAN 24-26 MG PO TABS
1.0000 | ORAL_TABLET | Freq: Two times a day (BID) | ORAL | Status: DC
  Administered 2024-09-08 – 2024-09-09 (×4): 1 via ORAL
  Filled 2024-09-08 (×4): qty 1

## 2024-09-08 MED ORDER — POTASSIUM CHLORIDE CRYS ER 20 MEQ PO TBCR
20.0000 meq | EXTENDED_RELEASE_TABLET | Freq: Once | ORAL | Status: AC
  Administered 2024-09-08: 20 meq via ORAL
  Filled 2024-09-08: qty 1

## 2024-09-08 NOTE — Progress Notes (Addendum)
 PROGRESS NOTE    Jesus Lam  FMW:[id number] DOB: 1933-09-22 DOA: 09/06/2024 PCP: Georgina Speaks, FNP  Subjective: Pt seen and examined. Met with pt and dtr [name]. Pt has been consuming much more than 60 ounces of fluid per day. Also eating excessive amounts of salt. Pt was living with another dtr but recently moved in with current dtr [name]. Dtr states pt's hand edema has much improved with diuresis overnight. Pt is breathing better. Confirms pt is not on home O2.   Hospital Course: CC: SOB, swelling hand and legs HPI: Jesus Lam is a 89 y.o. male with history of chronic HFrEF last EF measured was on August 23, 2024 when patient had myocardial perfusion scan which was negative for ischemia or infarct but did show EF of less than 35% with history of recurrent stroke prior history of DVT hypertension and recent diagnosis of atrial flutter presents to the ER with complaints of increasing shortness of breath and peripheral edema worsening over the last 2 weeks.  Denies any chest pain productive cough fever chills.  Patient states he has been compliant with his Lasix  20 mg daily.  He also has been more cautious about his diet recently.   ED Course: In the ER patient's EKG was showing atrial flutter controlled rate.  Chest x-ray was unremarkable.  proBNP was 846.  On exam patient has significant edema was given 20 mg IV Lasix  and admitted for further workup.  On exam patient has at least 3+ edema on both lower extremities and patient also feels his abdomen is getting distended.  He is on 2 L oxygen.  Since getting 20 mg IV Lasix  patient has put at least 500 cc out.  Admitted for CHF exacerbation.  Significant Events: Admitted 09/06/2024 for acute on chronic systolic CHF   Admission Labs: WBC 5.8, HgB 11.1, plt 149 Na 143, K 4.3, CO2 of 26, BUN 21, Scr 1.22, glu 87 T. Prot 7.9, alb 3.8, AST 20, ALT 13, alk phos 90, t. Bili 0.7 Pro BNP 846 Covid/rsv/flu negative Mg 1.9 TSH  7.531  Admission Imaging Studies: CXR Chronic interstitial basilar scarring changes and possible streaky atelectasis. No pulmonary edema or pleural effusions.  Significant Labs:   Significant Imaging Studies:   Antibiotic Therapy: Anti-infectives (From admission, onward)    None       Procedures:   Consultants: cardiology    Assessment and Plan: * Acute on chronic systolic CHF (congestive heart failure), NYHA class 3 (HCC) 09/07/24 due to combination of excessive fluid intake and excessive salt intake. Pt already feeling better. Pt's dtr [name] agrees. Less hand edema. Pt does weigh himself everyday. Pt now living with [facility]. She states she does not use cook with any salt in his food. Discussed that pt is limited to 60 ounces per day of ALL fluids. Not just water. ALL fluids meaning anything he can drink, including juice, coffee, soups,etc.  10/15 Transitioned from IV to PO Lasix  per Cardiology today --Monitor renal function & electrolytes closely  Weight Information (since admission)     Date/Time Weight Weight in lbs BSA (Calculated - sq m) BMI (Calculated) Who   09/07/24 0133 114 kg 251.32 lbs 2.41 sq meters 34.08 ML       Acute respiratory failure with hypoxia (HCC) 09/07/24 RA sats were less than 94% in [facility]. Placed on supplemental O2. Wean to RA. Pt not on home O2. 10/15 remains on 2 L/min [place] O2 with satws 92-95% --wean O2 as tolerated  Obesity, Class I, BMI 30-34.9 09/07/24 Body mass index is 34.09 kg/m.    Typical atrial flutter (HCC) 09/07/24 stable. On Eliquis .   CKD stage 3a, GFR 45-59 ml/min (HCC) - baseline Scr 1.1-1.3 09/07/24 baseline scr 1.1-1.3. monitor renal function with diuresis.   Hyperthyroidism TSH elevated 7.531 09/07/24 on tapazole  10 mg daily.  09/08/24 continue tapazole    Dysarthria as late effect of cerebellar cerebrovascular accident (CVA) 09/07/24 chronic.   Anemia 09/07/24 check iron  studies.   Essential hypertension 09/08/24 BP's controlled continue with norvasc , lasix , hydralazine    DVT prophylaxis:  apixaban  (ELIQUIS ) tablet 5 mg     Code Status: Full Code  Family Communication: none at bedside on rounds, will attempt to call  Disposition Plan: return home  Reason for continuing need for hospitalization: pending cardiology clearance for discharge, anticipate in 1-2 days having transitioned from IV to PO Lasix  today. Closely monitoring renal function.   Objective:  Vitals:   09/08/24 0502 09/08/24 0803 09/08/24 1052 09/08/24 1100  BP: 127/73 130/71 109/64 (!) 130/94  Pulse: 92 87  89  Resp: 20 20  17   Temp: 98.9 F (37.2 C) 98.9 F (37.2 C)  98.7 F (37.1 C)  TempSrc: Axillary Oral  Oral  SpO2: 92% 95%  94%  Weight: 112.6 kg     Height:        Intake/Output Summary (Last 24 hours) at 09/08/2024 1527 Last data filed at 09/08/2024 1100 Gross per 24 hour  Intake 660 ml  Output 2150 ml  Net -1490 ml   Filed Weights   09/07/24 0133 09/08/24 0502  Weight: 114 kg 112.6 kg    Examination:  General exam: awake, drowsy appearing, no acute distress,  HEENT: moist mucus membranes, hearing grossly normal  Respiratory system: CTAB, no wheezes, rales or rhonchi, normal respiratory effort. Cardiovascular system: normal S1/S2, RRR, trace BLE edema.   Gastrointestinal system: protuberant abdomen, non-tender, +bowel sounds. Central nervous system: A&O x 3. no gross focal neurologic deficits, normal speech Skin: dry, intact, normal temperature Psychiatry: normal mood, congruent affect, judgement and insight appear normal   Data Reviewed: I have personally reviewed following labs and imaging studies  CBC: Recent Labs  Lab 09/06/24 1414 09/07/24 0629  WBC 5.8 6.1  NEUTROABS 2.9 3.7  HGB 11.1* 11.3*  HCT 36.2* 36.4*  MCV 93.1 92.2  PLT 179 181   Basic Metabolic Panel: Recent Labs  Lab 09/06/24 1414 09/06/24 1428 09/07/24 0330  09/08/24 0430  NA 143  --  140 138  K 4.3  --  4.1 3.9  CL 107  --  104 101  CO2 26  --  25 26  GLUCOSE 87  --  113* 107*  BUN 21  --  21 23  CREATININE 1.22  --  1.31* 1.39*  CALCIUM  9.9  --  9.3 8.9  MG  --  1.9 1.8 1.7   GFR: Estimated Creatinine Clearance: 46.7 mL/min (A) (by C-G formula based on SCr of 1.39 mg/dL (H)). Liver Function Tests: Recent Labs  Lab 09/06/24 1414 09/07/24 0330  AST 20 19  ALT 13 16  ALKPHOS 90 71  BILITOT 0.7 1.0  PROT 7.9 7.8  ALBUMIN 3.8 3.2*   Thyroid  Function Tests: Recent Labs    09/07/24 0330 09/08/24 0430  TSH 7.531*  --   FREET4  --  0.72       Scheduled Meds:  apixaban   5 mg Oral BID   atorvastatin   80 mg Oral Daily  empagliflozin  10 mg Oral Daily   furosemide   20 mg Oral Daily   hydrALAZINE   50 mg Oral TID   methimazole   10 mg Oral Daily   mirtazapine   7.5 mg Oral QHS   sacubitril-valsartan  1 tablet Oral BID   spironolactone  12.5 mg Oral Daily   tamsulosin   0.8 mg Oral QPC supper   Continuous Infusions:   LOS: 2 days   Time spent: 42 minutes  Burnard DELENA Cunning, DO  [facility]  09/08/2024, 3:27 PM

## 2024-09-08 NOTE — Progress Notes (Signed)
 Rounding Note   Patient Name: Jesus Lam Date of Encounter: 09/08/2024  [facility] Cardiologist: Newman JINNY Lawrence, MD   Subjective States breathing is much better. Complains of severe leg cramps last night.   Scheduled Meds:  apixaban   5 mg Oral BID   atorvastatin   80 mg Oral Daily   empagliflozin  10 mg Oral Daily   furosemide   20 mg Oral Daily   hydrALAZINE   50 mg Oral TID   methimazole   10 mg Oral Daily   mirtazapine   7.5 mg Oral QHS   sacubitril-valsartan  1 tablet Oral BID   spironolactone  12.5 mg Oral Daily   tamsulosin   0.8 mg Oral QPC supper   Continuous Infusions:  PRN Meds: acetaminophen  **OR** acetaminophen , methocarbamol, mouth rinse   Vital Signs  Vitals:   09/07/24 1541 09/07/24 1805 09/08/24 0003 09/08/24 0502  BP: 138/80 116/72 134/84 127/73  Pulse: 78  98 92  Resp: 19  20 20   Temp: 98 F (36.7 C)  99.1 F (37.3 C) 98.9 F (37.2 C)  TempSrc: Oral  Oral Axillary  SpO2: 98%  94% 92%  Weight:    112.6 kg  Height:        Intake/Output Summary (Last 24 hours) at 09/08/2024 0800 Last data filed at 09/08/2024 0500 Gross per 24 hour  Intake 1020 ml  Output 1550 ml  Net -530 ml      09/08/2024    5:02 AM 09/07/2024    1:33 AM 08/23/2024   10:56 AM  Last 3 Weights  Weight (lbs) 248 lb 3.8 oz 251 lb 5.2 oz 246 lb  Weight (kg) 112.6 kg 114 kg 111.585 kg      Telemetry Atrial flutter with controlled rate. Slowest HR 41  - Personally Reviewed  ECG  None today - Personally Reviewed  Physical Exam  GEN: No acute distress.   Neck: No JVD Cardiac: IRRR, no murmurs, rubs, or gallops.  Respiratory: Clear to auscultation bilaterally. GI: Soft, nontender, non-distended  MS: No edema; No deformity. Neuro:  Nonfocal  Psych: Normal affect   Labs High Sensitivity Troponin:   Recent Labs  Lab 09/07/24 0330 09/07/24 0629  TROPONINIHS 31* 31*     Chemistry Recent Labs  Lab 09/06/24 1414 09/06/24 1428 09/07/24 0330  09/08/24 0430  NA 143  --  140 138  K 4.3  --  4.1 3.9  CL 107  --  104 101  CO2 26  --  25 26  GLUCOSE 87  --  113* 107*  BUN 21  --  21 23  CREATININE 1.22  --  1.31* 1.39*  CALCIUM  9.9  --  9.3 8.9  MG  --  1.9 1.8 1.7  PROT 7.9  --  7.8  --   ALBUMIN 3.8  --  3.2*  --   AST 20  --  19  --   ALT 13  --  16  --   ALKPHOS 90  --  71  --   BILITOT 0.7  --  1.0  --   GFRNONAA 57*  --  52* 48*  ANIONGAP 10  --  11 11    Lipids No results for input(s): CHOL, TRIG, HDL, LABVLDL, LDLCALC, CHOLHDL in the last 168 hours.  Hematology Recent Labs  Lab 09/06/24 1414 09/07/24 0629  WBC 5.8 6.1  RBC 3.89* 3.95*  HGB 11.1* 11.3*  HCT 36.2* 36.4*  MCV 93.1 92.2  MCH 28.5 28.6  MCHC 30.7 31.0  RDW 14.9 14.9  PLT 179 181   Thyroid   Recent Labs  Lab 09/07/24 0330  TSH 7.531*    BNP Recent Labs  Lab 09/06/24 1414  PROBNP 846.0*    DDimer No results for input(s): DDIMER in the last 168 hours.   Radiology  DG Chest Port 1 View Result Date: 09/06/2024 CLINICAL DATA:  Shortness of breath and leg swelling. EXAM: PORTABLE CHEST 1 VIEW COMPARISON:  08/06/2023 FINDINGS: The heart is within normal limits in size given the AP projection and portable technique. There is moderate tortuosity and calcification of the thoracic aorta which appears stable. Chronic interstitial basilar scarring changes and possible streaky atelectasis but no definite infiltrates, effusions or edema. IMPRESSION: Chronic interstitial basilar scarring changes and possible streaky atelectasis. No pulmonary edema or pleural effusions. Electronically Signed   By: MYRTIS Stammer M.D.   On: 09/06/2024 14:31    Cardiac Studies Echo: 02/2024   IMPRESSIONS     1. Left ventricular ejection fraction, by estimation, is 45 to 50%. The  left ventricle has mildly decreased function. The left ventricle  demonstrates global hypokinesis. There is moderate concentric left  ventricular hypertrophy. Left  ventricular  diastolic parameters are consistent with Grade I diastolic dysfunction  (impaired relaxation).   2. Right ventricular systolic function is normal. The right ventricular  size is normal. There is normal pulmonary artery systolic pressure.   3. Left atrial size was moderately dilated.   4. The mitral valve is normal in structure. Trivial mitral valve  regurgitation. No evidence of mitral stenosis.   5. The aortic valve is calcified. There is mild calcification of the  aortic valve. There is mild thickening of the aortic valve. Aortic valve  regurgitation is mild to moderate. Mild aortic valve stenosis. Aortic  valve area, by VTI measures 1.41 cm.  Aortic valve mean gradient measures 12.0 mmHg. Aortic valve Vmax measures  2.51 m/s.   6. Aortic dilatation noted. There is mild dilatation of the ascending  aorta, measuring 41 mm.   7. The inferior vena cava is normal in size with greater than 50%  respiratory variability, suggesting right atrial pressure of 3 mmHg.    Patient Profile   89 y.o. male with a hx of HTN, HLD, PAD, h/o DVT, recurrent stroke, HFrEF, persistent atrial flutter who is being seen 09/07/2024 for the evaluation of CHF at the request of Dr. Laurence.   Assessment & Plan  Acute on Chronic HFrEF -- echo 02/2024 with LVEF of 45-50%, normal RV -- presented with several days of shortness of breath and LE edema. Hypoxic on admission requiring O2. Says he avoids salt but fluid intake has been high -- BNP 848, CXR with interstitial basilar scarring and atelectasis --excellent response to IV lasix . I/o negative 5 liters. Weight is down.  -- recent outpatient LVEF <35% on lexiscan myoview -- repeat echo pending today -- GDMT: BB stopped in the past 2/2 bradycardia/pauses. Amlodipine  held. On low dose aldactone. Will stop IV lasix  today and resume 20 mg po daily. Will start Entresto 24/26 mg bid and Jardiance 10 mg daily. Follow BMET closely. Consider adding nitrates later  if BP allows.    2. Persistent atrial flutter -- long hx of the same, as above no BB in the setting of bradycardia. Appears he has mostly been rate controlled for quite some time -- on Eliquis  5mg  BID   -- has been referred to EP outpatient, options are limited but suspect he may not be an ablation  candidate    3. Hyperthyroidism -- follows with endocrinology outpatient on methimazole  -- TSH now 7.5 on admission -- per primary team    Hx of recurrent strokes PAD Hx of DVT        For questions or updates, please contact [facility] Please consult [email or web address] for contact info under       Signed, Emara Lichter [place], MD  09/08/2024, 8:00 AM

## 2024-09-08 NOTE — Progress Notes (Signed)
 Mobility Specialist Progress Note;   09/08/24 1122  Mobility  Activity Pivoted/transferred from bed to chair  Level of Assistance Moderate assist, patient does 50-74%  Assistive Device Front wheel walker  Distance Ambulated (ft) 5 ft  Activity Response Tolerated well  Mobility Referral Yes  Mobility visit 1 Mobility  Mobility Specialist Start Time (ACUTE ONLY) 1122  Mobility Specialist Stop Time (ACUTE ONLY) 1133  Mobility Specialist Time Calculation (min) (ACUTE ONLY) 11 min   Pt agreeable to mobility. Required MinG for bed mobility, ModA+2 to safely transfer from bed to chair. VSS on 2LO2. No c/o when asked. Pt left with all needs met, eating lunch. Chair alarm on.   Lauraine Erm Mobility Specialist Please contact via SecureChat or [employer] [phone]

## 2024-09-08 NOTE — TOC Initial Note (Signed)
 Transition of Care [facility]) - Initial/Assessment Note    Patient Details  Name: Jesus Lam MRN: [id number] Date of Birth: [birth year]  [facility]) CM/SW Contact:    Jesus KANDICE Stain, RN Phone Number: 09/08/2024, 4:11 PM  Clinical Narrative:                 Spoke to daughter, Jesus Lam, regarding transition needs.  Patient lives alone and uses walker. Daughter lives in same apartment complex. Jesus Lam transports patient as needed.  Patient is active with [facility] home health. Jesus Lam with [facility] notified.  Jesus Lam is agreeable to SNF pending pt, ot recs.  Address, Phone number and PCP verified.  Expected Discharge Plan: Skilled Nursing Facility Barriers to Discharge: Continued Medical Work up   Patient Goals and CMS Choice Patient states their goals for this hospitalization and ongoing recovery are:: DAUGHTER IS AGREEABLE TO SNF IF NEEDED CMS Medicare.gov Compare Post Acute Care list provided to:: Patient Represenative (must comment) Choice offered to / list presented to : Adult Children      Expected Discharge Plan and Services   Discharge Planning Services: CM Consult   Living arrangements for the past 2 months: Apartment                                      Prior Living Arrangements/Services Living arrangements for the past 2 months: Apartment   Patient language and need for interpreter reviewed:: Yes        Need for Family Participation in Patient Care: Yes (Comment) Care giver support system in place?: Yes (comment) Current home services: [name]) Criminal Activity/Legal Involvement Pertinent to Current Situation/Hospitalization: No - Comment as needed  Activities of Daily Living   ADL Screening (condition at time of admission) Independently performs ADLs?: Yes (appropriate for developmental age) Is the patient deaf or have difficulty hearing?: Yes Does the patient have difficulty seeing, even when wearing glasses/contacts?: No Does the  patient have difficulty concentrating, remembering, or making decisions?: No  Permission Sought/Granted                  Emotional Assessment Appearance:: Appears stated age     Orientation: : Fluctuating Orientation (Suspected and/or reported Sundowners) Alcohol / Substance Use: Not Applicable Psych Involvement: No (comment)  Admission diagnosis:  Acute respiratory failure with hypoxia (HCC) [J96.01] Acute on chronic systolic CHF (congestive heart failure), NYHA class 3 (HCC) [I50.23] Acute on chronic congestive heart failure, unspecified heart failure type (HCC) [I50.9] Patient Active Problem List   Diagnosis Date Noted   Acute respiratory failure with hypoxia (HCC) 09/07/2024   Acute on chronic systolic CHF (congestive heart failure), NYHA class 3 (HCC) 09/06/2024   Typical atrial flutter (HCC) 07/14/2024   HFrEF (heart failure with reduced ejection fraction) (HCC) 07/14/2024   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 stage 3a chronic kidney disease (HCC) 06/30/2024   Decreased appetite 06/30/2024   Expressive aphasia 04/23/2024   Chronic HFrEF (heart failure with reduced ejection fraction) (HCC) 03/23/2024   Benign hypertensive heart and kidney disease with congestive heart failure and stage 3 chronic kidney disease (HCC) 08/26/2023   Does use hearing aid 06/17/2023   Bilateral hearing loss 06/17/2023   Obesity, Class I, BMI 30-34.9 06/17/2023   CKD stage 3a, GFR 45-59 ml/min (HCC) - baseline Scr 1.1-1.3 01/20/2023   Presbycusis of both ears 11/01/2021   Leg edema 08/01/2021  Mild cognitive impairment 09/07/2020   Chronic pain of left knee 03/15/2020   Pure hypercholesterolemia 11/15/2019   First degree AV block 07/28/2019   Hypertriglyceridemia 04/29/2019   Mixed hyperlipidemia 04/29/2019   Hyperthyroidism 11/18/2018   PAD (peripheral artery disease) 06/13/2018   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 [place] [address] Phone: [phone] Fax: [phone]  [facility] - [place], [place] - [address] [place] [address] Phone: [phone] Fax: [phone]     Social Drivers of Health (SDOH) Social History: SDOH Screenings   Food Insecurity: No Food Insecurity (09/07/2024)  Housing: Low Risk  (09/07/2024)  Transportation Needs: No Transportation Needs (09/07/2024)  Utilities: Not At Risk (09/07/2024)  Alcohol Screen: Low Risk  (06/16/2024)  Depression (PHQ2-9): Low Risk  (06/16/2024)  Financial Resource Strain: Low Risk  (06/16/2024)  Physical Activity: Inactive (06/16/2024)  Social Connections: Patient Declined (09/07/2024)  Recent Concern: Social Connections - Socially Isolated (06/16/2024)  Stress: No Stress Concern Present (06/16/2024)  Tobacco Use: Medium Risk (09/07/2024)  Health Literacy: Adequate Health Literacy (06/16/2024)   SDOH Interventions:     Readmission Risk Interventions     No data to display

## 2024-09-09 DIAGNOSIS — J9601 Acute respiratory failure with hypoxia: Secondary | ICD-10-CM | POA: Diagnosis not present

## 2024-09-09 DIAGNOSIS — I5023 Acute on chronic systolic (congestive) heart failure: Secondary | ICD-10-CM | POA: Diagnosis not present

## 2024-09-09 DIAGNOSIS — N1831 Chronic kidney disease, stage 3a: Secondary | ICD-10-CM | POA: Diagnosis not present

## 2024-09-09 DIAGNOSIS — I10 Essential (primary) hypertension: Secondary | ICD-10-CM | POA: Diagnosis not present

## 2024-09-09 LAB — BASIC METABOLIC PANEL WITH GFR
Anion gap: 11 (ref 5–15)
BUN: 33 mg/dL — ABNORMAL HIGH (ref 8–23)
CO2: 25 mmol/L (ref 22–32)
Calcium: 8.5 mg/dL — ABNORMAL LOW (ref 8.9–10.3)
Chloride: 103 mmol/L (ref 98–111)
Creatinine, Ser: 1.72 mg/dL — ABNORMAL HIGH (ref 0.61–1.24)
GFR, Estimated: 38 mL/min — ABNORMAL LOW
Glucose, Bld: 102 mg/dL — ABNORMAL HIGH (ref 70–99)
Potassium: 4.3 mmol/L (ref 3.5–5.1)
Sodium: 139 mmol/L (ref 135–145)

## 2024-09-09 LAB — MAGNESIUM: Magnesium: 2.3 mg/dL (ref 1.7–2.4)

## 2024-09-09 MED ORDER — DICLOFENAC SODIUM 1 % EX GEL
2.0000 g | Freq: Four times a day (QID) | CUTANEOUS | Status: DC
Start: 2024-09-09 — End: 2024-09-17
  Administered 2024-09-09 – 2024-09-17 (×32): 2 g via TOPICAL
  Filled 2024-09-09 (×2): qty 100

## 2024-09-09 MED ORDER — ACETAMINOPHEN 325 MG PO TABS
650.0000 mg | ORAL_TABLET | Freq: Three times a day (TID) | ORAL | Status: DC
  Administered 2024-09-09 – 2024-09-17 (×25): 650 mg via ORAL
  Filled 2024-09-09 (×25): qty 2

## 2024-09-09 NOTE — TOC Progression Note (Signed)
 Transition of Care [facility]) - Progression Note    Patient Details  Name: Jesus Lam MRN: [id number] Date of Birth: January 02, 1933  [facility] CM/SW Contact  Lauraine FORBES Saa, LCSWA Phone Number: 09/09/2024, 3:30 PM  Clinical Narrative:     3:30 PM Per chart review, physical therapy recommended patient discharge to SNF, and patient identified patient's daughter, [name], is point of contact. Bedside RN suggested CSW to speak with [facility] vs patient regarding placement, as well. CSW informed [name] of therapy's recommendation. [name] expressed preference in CIR vs SNF and requested CSW to send out referrals to SNFs tomorrow instead of today to monitor progress. CSW relayed informed to bedside RN and PT/OT. TOC will continue to follow.  Expected Discharge Plan: IP Rehab Facility Barriers to Discharge: Continued Medical Work up, Air traffic controller and Services In-house Referral: Clinical Social Work Discharge Planning Services: CM Consult Post Acute Care Choice: IP Rehab, Skilled Nursing Facility Living arrangements for the past 2 months: Apartment                                       Social Drivers of Health (SDOH) Interventions SDOH Screenings   Food Insecurity: No Food Insecurity (09/07/2024)  Housing: Low Risk  (09/07/2024)  Transportation Needs: No Transportation Needs (09/07/2024)  Utilities: Not At Risk (09/07/2024)  Alcohol Screen: Low Risk  (06/16/2024)  Depression (PHQ2-9): Low Risk  (06/16/2024)  Financial Resource Strain: Low Risk  (06/16/2024)  Physical Activity: Inactive (06/16/2024)  Social Connections: Patient Declined (09/07/2024)  Recent Concern: Social Connections - Socially Isolated (06/16/2024)  Stress: No Stress Concern Present (06/16/2024)  Tobacco Use: Medium Risk (09/07/2024)  Health Literacy: Adequate Health Literacy (06/16/2024)    Readmission Risk Interventions     No data to display

## 2024-09-09 NOTE — Progress Notes (Signed)
  Progress Note  Patient Name: Jesus Lam Date of Encounter: 09/09/2024 [facility] Cardiologist: Newman JINNY Lawrence, MD   Interval Summary    Breathing is better, still with leg cramping.   Vital Signs Vitals:   09/09/24 0034 09/09/24 0309 09/09/24 0510 09/09/24 0730  BP: 111/62 121/62  104/81  Pulse: 74 80  92  Resp: 20 18  20   Temp: 98.6 F (37 C) 98.6 F (37 C)  98.7 F (37.1 C)  TempSrc: Oral Oral  Oral  SpO2: 95% 91%  95%  Weight:   112.1 kg   Height:        Intake/Output Summary (Last 24 hours) at 09/09/2024 0943 Last data filed at 09/09/2024 0630 Gross per 24 hour  Intake 720 ml  Output 2275 ml  Net -1555 ml      09/09/2024    5:10 AM 09/08/2024    5:02 AM 09/07/2024    1:33 AM  Last 3 Weights  Weight (lbs) 247 lb 2.2 oz 248 lb 3.8 oz 251 lb 5.2 oz  Weight (kg) 112.1 kg 112.6 kg 114 kg      Telemetry/ECG  Atrial flutter rate controlled - Personally Reviewed  Physical Exam  GEN: No acute distress.   Neck: No JVD Cardiac: Irreg, no murmurs, rubs, or gallops.  Respiratory: Clear to auscultation bilaterally. GI: Soft, nontender, non-distended  MS: No edema  Assessment & Plan   Acute on Chronic HFrEF -- echo 02/2024 with LVEF of 45-50%, normal RV -- presented with several days of shortness of breath and LE edema. Hypoxic on admission requiring O2. Says he avoids salt but fluid intake has been high -- BNP 848, CXR with interstitial basilar scarring and atelectasis -- s/p IV lasix . Net - 5.2L, weight down -- recent outpatient LVEF <35% on lexiscan myoview -- Echo 10/15 LVEF 30-35%, global hypokinesis, normal RV, severely enlarged LA -- GDMT: BB stopped in the past 2/2 bradycardia/pauses. Amlodipine  held. Was started on low dose aldactone, switched to PO lasix  and Entresto 24/26mg  BID but Cr up 1.7 today. Stop spiro and lasix  today. -- BMET in am   Persistent atrial flutter -- long hx of the same, as above no BB in the setting of  bradycardia. Appears he has mostly been rate controlled for quite some time -- on Eliquis  5mg  BID   -- has been referred to EP outpatient, options are limited but suspect he may not be an ablation candidate    Hyperthyroidism -- follows with endocrinology outpatient on methimazole  -- TSH now 7.5 on admission -- per primary team    Hx of recurrent strokes PAD Hx of DVT   For questions or updates, please contact [facility] Please consult [email or web address] for contact info under    Signed, Manuelita Rummer, NP

## 2024-09-09 NOTE — Evaluation (Signed)
 Occupational Therapy Evaluation Patient Details Name: Jesus Lam MRN: [id number] DOB: 1933-09-08 Today's Date: 09/09/2024   History of Present Illness   Pt is an 89 y/o male admitted due to acute CHF exacerbation. PMH: atrial flutter, CHF, CVA, hyperthyroidism, HTN, BPH     Clinical Impressions PTA, pt lives alone, typically ambulatory with a walker and has HH aide assist for ADLs/IADLs 7 days week/2.5 hours. Pt presents now primarily limited by BLE pain requiring Mod A x 2 for safety to stand and take 2-3 steps before requiring seated rest break. Pt requires Min A for UB ADL and Max A for LB ADLs d/t deficits noted below. Encouraged AROM exercises for BLE and placed heat packs on B knees to hopefully combat stiffness and pain. Daughter at bedside and both pt/family agree need to consider continued inpatient follow up therapy, <3 hours/day at DC w/ preference for [place] area.  SpO2 92% on 2 L O2     If plan is discharge home, recommend the following:   A lot of help with walking and/or transfers;Two people to help with walking and/or transfers;A lot of help with bathing/dressing/bathroom     Functional Status Assessment   Patient has had a recent decline in their functional status and demonstrates the ability to make significant improvements in function in a reasonable and predictable amount of time.     Equipment Recommendations   None recommended by OT     Recommendations for Other Services         Precautions/Restrictions   Precautions Precautions: Fall Restrictions Weight Bearing Restrictions Per Provider Order: No     Mobility Bed Mobility               General bed mobility comments: in recliner on entry    Transfers Overall transfer level: Needs assistance Equipment used: Rolling walker (2 wheels) Transfers: Sit to/from Stand Sit to Stand: Mod assist, +2 safety/equipment           General transfer comment: Mod A x 2 for safety to  stand from recliner with RW. Good effort on pt's part. Increased time to take 2-3 steps before reporting need to sit due to pain      Balance Overall balance assessment: Needs assistance Sitting-balance support: No upper extremity supported, Feet supported Sitting balance-Leahy Scale: Fair     Standing balance support: Bilateral upper extremity supported, During functional activity Standing balance-Leahy Scale: Poor                             ADL either performed or assessed with clinical judgement   ADL Overall ADL's : Needs assistance/impaired Eating/Feeding: Independent   Grooming: Set up;Sitting   Upper Body Bathing: Minimal assistance;Sitting   Lower Body Bathing: Maximal assistance;Sit to/from stand   Upper Body Dressing : Minimal assistance;Sitting   Lower Body Dressing: Maximal assistance;Sit to/from stand;Sitting/lateral leans   Toilet Transfer: Moderate assistance;+2 for physical assistance;+2 for safety/equipment;Stand-pivot;BSC/3in1;Rolling walker (2 wheels)   Toileting- Clothing Manipulation and Hygiene: Maximal assistance;Sit to/from stand;Sitting/lateral lean         General ADL Comments: Limited primarily by BLE pain. In collaboration with daughter, educated in AROM exercises for BLE to prevent stiffness     Vision Ability to See in Adequate Light: 0 Adequate Patient Visual Report: No change from baseline Vision Assessment?: No apparent visual deficits     Perception         Praxis  Pertinent Vitals/Pain Pain Assessment Pain Assessment: Faces Faces Pain Scale: Hurts even more Pain Location: BLE (nonspecific if knees, joint pain vs muscle, etc) Pain Descriptors / Indicators: Grimacing, Guarding Pain Intervention(s): Monitored during session, Limited activity within patient's tolerance, RN gave pain meds during session, Heat applied     Extremity/Trunk Assessment Upper Extremity Assessment Upper Extremity Assessment:  Generalized weakness;Right hand dominant   Lower Extremity Assessment Lower Extremity Assessment: Defer to PT evaluation   Cervical / Trunk Assessment Cervical / Trunk Assessment: Normal   Communication Communication Communication: Impaired Factors Affecting Communication: Reduced clarity of speech   Cognition Arousal: Alert, Lethargic Behavior During Therapy: WFL for tasks assessed/performed Cognition: No apparent impairments             OT - Cognition Comments: lethargic, some garbled speech but overall functional- insight into deficits and answering questions appropriately                 Following commands: Impaired Following commands impaired: Follows one step commands with increased time, Follows multi-step commands with increased time     Cueing  General Comments   Cueing Techniques: Verbal cues;Gestural cues  Daughter, [name], at bedside   Exercises     Shoulder Instructions      Home Living Family/patient expects to be discharged to:: Private residence Living Arrangements: Alone Available Help at Discharge: Family;Personal care attendant;Available PRN/intermittently Type of Home: Apartment Home Access: Elevator     Home Layout: One level     Bathroom Shower/Tub: Chief Strategy Officer: Standard (BSC over top)     Home Equipment: Cane - single point;Wheelchair - manual;Grab bars - toilet;Grab bars - tub/shower;Hand held Scientist, physiological (2 wheels)   Additional Comments: Aide 7 days/wk for 2.5      Prior Functioning/Environment Prior Level of Function : Needs assist             Mobility Comments: RW for mobility ADLs Comments: aide assists with showers/dressing for the day, as well as meal prep, laundry and transportation. Pt able to toilet self but reports some difficulty reaching for hygiene with BSC over toilet    OT Problem List: Decreased strength;Decreased activity tolerance;Impaired  balance (sitting and/or standing);Pain;Cardiopulmonary status limiting activity   OT Treatment/Interventions: Self-care/ADL training;Therapeutic exercise;Energy conservation;DME and/or AE instruction;Therapeutic activities;Patient/family education;Balance training      OT Goals(Current goals can be found in the care plan section)   Acute Rehab OT Goals Patient Stated Goal: pain control, be able to walk. open to rehab OT Goal Formulation: With patient/family Time For Goal Achievement: 09/23/24 Potential to Achieve Goals: Good ADL Goals Pt Will Perform Grooming: with contact guard assist;standing Pt Will Transfer to Toilet: with min assist;ambulating Pt Will Perform Toileting - Clothing Manipulation and hygiene: with min assist;sitting/lateral leans;sit to/from stand Pt/caregiver will Perform Home Exercise Program: Increased strength;Both right and left upper extremity;With theraband;Independently;With written HEP provided   OT Frequency:  Min 2X/week    Co-evaluation              AM-PAC OT 6 Clicks Daily Activity     Outcome Measure Help from another person eating meals?: None Help from another person taking care of personal grooming?: A Little Help from another person toileting, which includes using toliet, bedpan, or urinal?: A Lot Help from another person bathing (including washing, rinsing, drying)?: A Lot Help from another person to put on and taking off regular upper body clothing?: A Little Help from another person to put on  and taking off regular lower body clothing?: A Lot 6 Click Score: 16   End of Session Equipment Utilized During Treatment: Gait belt;Rolling walker (2 wheels) Nurse Communication: Patient requests pain meds;Mobility status  Activity Tolerance: Patient limited by pain Patient left: in chair;with call bell/phone within reach;with chair alarm set;with family/visitor present  OT Visit Diagnosis: Other abnormalities of gait and mobility  (R26.89);Unsteadiness on feet (R26.81);Muscle weakness (generalized) (M62.81)                Time: 9076-9051 OT Time Calculation (min): 25 min Charges:  OT General Charges $OT Visit: 1 Visit OT Evaluation $OT Eval Moderate Complexity: 1 Mod OT Treatments $Therapeutic Activity: 8-22 mins  Mliss NOVAK, OTR/L Acute Rehab Services Office: [phone]   Mliss Fish 09/09/2024, 10:16 AM

## 2024-09-09 NOTE — Progress Notes (Signed)
 PROGRESS NOTE    Jesus Lam  FMW:[id number] DOB: 02-02-1933 DOA: 09/06/2024 PCP: Georgina Speaks, FNP  Subjective: Pt seen and examined. Met with pt and dtr [name]. Pt has been consuming much more than 60 ounces of fluid per day. Also eating excessive amounts of salt. Pt was living with another dtr but recently moved in with current dtr [name]. Dtr states pt's hand edema has much improved with diuresis overnight. Pt is breathing better. Confirms pt is not on home O2.   Hospital Course: CC: SOB, swelling hand and legs HPI: Jesus Lam is a 89 y.o. male with history of chronic HFrEF last EF measured was on August 23, 2024 when patient had myocardial perfusion scan which was negative for ischemia or infarct but did show EF of less than 35% with history of recurrent stroke prior history of DVT hypertension and recent diagnosis of atrial flutter presents to the ER with complaints of increasing shortness of breath and peripheral edema worsening over the last 2 weeks.  Denies any chest pain productive cough fever chills.  Patient states he has been compliant with his Lasix  20 mg daily.  He also has been more cautious about his diet recently.   ED Course: In the ER patient's EKG was showing atrial flutter controlled rate.  Chest x-ray was unremarkable.  proBNP was 846.  On exam patient has significant edema was given 20 mg IV Lasix  and admitted for further workup.  On exam patient has at least 3+ edema on both lower extremities and patient also feels his abdomen is getting distended.  He is on 2 L oxygen.  Since getting 20 mg IV Lasix  patient has put at least 500 cc out.  Admitted for CHF exacerbation.  Significant Events: Admitted 09/06/2024 for acute on chronic systolic CHF   Admission Labs: WBC 5.8, HgB 11.1, plt 149 Na 143, K 4.3, CO2 of 26, BUN 21, Scr 1.22, glu 87 T. Prot 7.9, alb 3.8, AST 20, ALT 13, alk phos 90, t. Bili 0.7 Pro BNP 846 Covid/rsv/flu negative Mg 1.9 TSH  7.531  Admission Imaging Studies: CXR Chronic interstitial basilar scarring changes and possible streaky atelectasis. No pulmonary edema or pleural effusions.  Significant Labs:   Significant Imaging Studies:   Antibiotic Therapy: Anti-infectives (From admission, onward)    None       Procedures:   Consultants: cardiology    Assessment and Plan: * Acute on chronic systolic CHF (congestive heart failure), NYHA class 3 (HCC) 09/07/24 due to combination of excessive fluid intake and excessive salt intake. Pt already feeling better. Pt's dtr [name] agrees. Less hand edema. Pt does weigh himself everyday. Pt now living with [facility]. She states she does not use cook with any salt in his food. Discussed that pt is limited to 60 ounces per day of ALL fluids. Not just water. ALL fluids meaning anything he can drink, including juice, coffee, soups,etc.  10/15 Transitioned from IV to PO Lasix  per Cardiology today 10/16 Hold Lasix  with Cr rising --Monitor renal function & electrolytes closely  Weight Information (since admission)     Date/Time Weight Weight in lbs BSA (Calculated - sq m) BMI (Calculated) Who   09/07/24 0133 114 kg 251.32 lbs 2.41 sq meters 34.08 ML       Acute respiratory failure with hypoxia (HCC) 10/16 remains on 2 L/min [place] O2 with sats 91-96%% --wean O2 as tolerated   Obesity, Class I, BMI 30-34.9 09/09/24 Body mass index is 34.09 kg/m.    Typical  atrial flutter (HCC) 09/09/24 stable. On Eliquis .   CKD stage 3a, GFR 45-59 ml/min (HCC) - baseline Scr 1.1-1.3 09/09/24 baseline scr 1.1-1.3.  Cr bumped up 1.39 >> 1.72 yesterday to today --monitor renal function  --holding Lasix  and Aldactone for now  Hyperthyroidism TSH elevated 7.531 09/07/24 on tapazole  10 mg daily.  09/09/24 continue tapazole    Dysarthria as late effect of cerebellar cerebrovascular accident (CVA) 09/07/24 chronic.   Anemia Iron low 32, sat ratio 9% 10/16  Hbg stable  11.3 --Monitor CBC  Essential hypertension 09/09/24 BP's controlled continue with norvasc , lasix , hydralazine    DVT prophylaxis:  apixaban  (ELIQUIS ) tablet 5 mg     Code Status: Full Code  Family Communication: daughter at bedside on rounds today 10/16  Disposition Plan: SNF/rehab  Reason for continuing need for hospitalization: pending cardiology clearance for discharge. Closely monitoring renal function.  Needs SNF placement for rehab.   Objective:  Vitals:   09/09/24 0510 09/09/24 0730 09/09/24 1027 09/09/24 1131  BP:  104/81 (!) 110/51 105/71  Pulse:  92  87  Resp:  20  19  Temp:  98.7 F (37.1 C)  98.7 F (37.1 C)  TempSrc:  Oral  Oral  SpO2:  95%  96%  Weight: 112.1 kg     Height:        Intake/Output Summary (Last 24 hours) at 09/09/2024 1225 Last data filed at 09/09/2024 0630 Gross per 24 hour  Intake 300 ml  Output 1075 ml  Net -775 ml   Filed Weights   09/07/24 0133 09/08/24 0502 09/09/24 0510  Weight: 114 kg 112.6 kg 112.1 kg    Examination:  General exam: awake, alert, no acute distress,  HEENT: moist mucus membranes, hearing grossly normal  Respiratory system: CTAB, no wheezes, rales or rhonchi, normal respiratory effort. Cardiovascular system: normal S1/S2, RRR, resolved BLE edema.   Gastrointestinal system: protuberant abdomen, non-tender, +bowel sounds. Central nervous system: A&O x 3. no gross focal neurologic deficits, normal speech Skin: dry, intact, normal temperature Psychiatry: normal mood, congruent affect, judgement and insight appear normal   Data Reviewed: I have personally reviewed following labs and imaging studies  CBC: Recent Labs  Lab 09/06/24 1414 09/07/24 0629  WBC 5.8 6.1  NEUTROABS 2.9 3.7  HGB 11.1* 11.3*  HCT 36.2* 36.4*  MCV 93.1 92.2  PLT 179 181   Basic Metabolic Panel: Recent Labs  Lab 09/06/24 1414 09/06/24 1428 09/07/24 0330 09/08/24 0430 09/09/24 0250  NA 143  --  140 138 139  K 4.3  --  4.1  3.9 4.3  CL 107  --  104 101 103  CO2 26  --  25 26 25   GLUCOSE 87  --  113* 107* 102*  BUN 21  --  21 23 33*  CREATININE 1.22  --  1.31* 1.39* 1.72*  CALCIUM  9.9  --  9.3 8.9 8.5*  MG  --  1.9 1.8 1.7 2.3   GFR: Estimated Creatinine Clearance: 37.6 mL/min (A) (by C-G formula based on SCr of 1.72 mg/dL (H)). Liver Function Tests: Recent Labs  Lab 09/06/24 1414 09/07/24 0330  AST 20 19  ALT 13 16  ALKPHOS 90 71  BILITOT 0.7 1.0  PROT 7.9 7.8  ALBUMIN 3.8 3.2*   Thyroid  Function Tests: Recent Labs    09/07/24 0330 09/08/24 0430  TSH 7.531*  --   FREET4  --  0.72       Scheduled Meds:  acetaminophen   650 mg Oral TID  apixaban   5 mg Oral BID   atorvastatin   80 mg Oral Daily   diclofenac  Sodium  2 g Topical QID   empagliflozin  10 mg Oral Daily   hydrALAZINE   50 mg Oral TID   methimazole   10 mg Oral Daily   mirtazapine   7.5 mg Oral QHS   sacubitril-valsartan  1 tablet Oral BID   tamsulosin   0.8 mg Oral QPC supper   Continuous Infusions:   LOS: 3 days   Time spent: 42 minutes  Burnard DELENA Cunning, DO  [facility]  09/09/2024, 12:25 PM

## 2024-09-09 NOTE — Care Management Important Message (Signed)
 Important Message  Patient Details  Name: Jesus Lam MRN: [id number] Date of Birth: 28-May-1933   Important Message Given:  Yes - Medicare IM     Claretta Deed 09/09/2024, 2:40 PM

## 2024-09-09 NOTE — Evaluation (Addendum)
 Physical Therapy Evaluation Patient Details Name: Jesus Lam MRN: [id number] DOB: [birth year] Today's Date: 09/09/2024  History of Present Illness  Pt is an 89 y/o male admitted due to acute CHF exacerbation. PMH: atrial flutter, CHF, CVA, hyperthyroidism, HTN, BPH  Clinical Impression  Patient presents with decreased mobility due to pain, limited activity tolerance, decreased balance, decreased strength.  He was previously mobilizing at home with RW with aide assistance 2.5 hours/day 7 days a week.  Currently needing +2 for transfers from recliner and to step to bed safely with significant LE pain.  Patient will benefit from skilled PT in the acute setting to allow d/c home following inpatient rehab stay (<3 hours/day).         If plan is discharge home, recommend the following: A lot of help with walking and/or transfers;A lot of help with bathing/dressing/bathroom   Can travel by private vehicle   No    Equipment Recommendations None recommended by PT  Recommendations for Other Services       Functional Status Assessment Patient has had a recent decline in their functional status and demonstrates the ability to make significant improvements in function in a reasonable and predictable amount of time.     Precautions / Restrictions Precautions Precautions: Fall Recall of Precautions/Restrictions: Intact      Mobility  Bed Mobility Overal bed mobility: Needs Assistance Bed Mobility: Sit to Supine       Sit to supine: Mod assist   General bed mobility comments: help for legs and trunk to supine    Transfers Overall transfer level: Needs assistance Equipment used: Rolling walker (2 wheels) Transfers: Sit to/from Stand, Bed to chair/wheelchair/BSC Sit to Stand: Mod assist, +2 safety/equipment   Step pivot transfers: Mod assist, +2 safety/equipment       General transfer comment: lifting hep to stand from recliner with RW assist for step pivot though pt yelling in  pain throughout    Ambulation/Gait               General Gait Details: unable to progress  Stairs            Wheelchair Mobility     Tilt Bed    Modified Rankin (Stroke Patients Only)       Balance Overall balance assessment: Needs assistance Sitting-balance support: Feet supported Sitting balance-Leahy Scale: Fair     Standing balance support: Bilateral upper extremity supported Standing balance-Leahy Scale: Poor Standing balance comment: UE support and min A static standing                             Pertinent Vitals/Pain Pain Assessment Pain Assessment: Faces Faces Pain Scale: Hurts whole lot Pain Location: L knee>R Pain Descriptors / Indicators: Grimacing, Guarding, Moaning, Aching Pain Intervention(s): Monitored during session, Repositioned, Premedicated before session (medicated just immediately prior to session)    Home Living Family/patient expects to be discharged to:: Private residence Living Arrangements: Alone Available Help at Discharge: Family;Personal care attendant;Available PRN/intermittently Type of Home: Apartment Home Access: Elevator       Home Layout: One level Home Equipment: Cane - single point;Wheelchair - manual;Grab bars - toilet;Grab bars - tub/shower;Hand held shower head;BSC/3in1;Shower seat;Rolling Environmental consultant (2 wheels);Lift chair Additional Comments: Aide 7 days/wk for 2.5    Prior Function Prior Level of Function : Needs assist             Mobility Comments: RW for mobility; sleeps in lift recliner ADLs  Comments: aide assists with showers/dressing for the day, as well as meal prep, laundry and transportation. Pt able to toilet self but reports some difficulty reaching for hygiene with BSC over toilet     Extremity/Trunk Assessment   Upper Extremity Assessment Upper Extremity Assessment: Defer to OT evaluation    Lower Extremity Assessment Lower Extremity Assessment: RLE deficits/detail;LLE  deficits/detail RLE Deficits / Details: AAROM grossly limited knee flexion to about 80 with feet on the ground; strength hip flexion 3-/5, knee extension 4-/5 LLE Deficits / Details: AAROM grossly limited knee flexion to about 80 with feet on the ground; strength hip flexion 3-/5, knee extension 4-/5    Cervical / Trunk Assessment Cervical / Trunk Assessment: Normal  Communication   Communication Communication: Impaired Factors Affecting Communication: Reduced clarity of speech    Cognition Arousal: Alert Behavior During Therapy: WFL for tasks assessed/performed                             Following commands: Impaired Following commands impaired: Follows one step commands with increased time, Follows multi-step commands with increased time     Cueing Cueing Techniques: Verbal cues, Gestural cues     General Comments General comments (skin integrity, edema, etc.): no family at bedside.    Exercises     Assessment/Plan    PT Assessment Patient needs continued PT services  PT Problem List         PT Treatment Interventions Gait training;Functional mobility training;Therapeutic activities;Therapeutic exercise;Balance training    PT Goals (Current goals can be found in the Care Plan section)  Acute Rehab PT Goals Patient Stated Goal: get up easier PT Goal Formulation: With patient Time For Goal Achievement: 09/23/24 Potential to Achieve Goals: Fair    Frequency Min 2X/week     Co-evaluation               AM-PAC PT 6 Clicks Mobility  Outcome Measure Help needed turning from your back to your side while in a flat bed without using bedrails?: A Lot Help needed moving from lying on your back to sitting on the side of a flat bed without using bedrails?: Total Help needed moving to and from a bed to a chair (including a wheelchair)?: Total Help needed standing up from a chair using your arms (e.g., wheelchair or bedside chair)?: Total Help needed to  walk in hospital room?: Total Help needed climbing 3-5 steps with a railing? : Total 6 Click Score: 7    End of Session Equipment Utilized During Treatment: Gait belt Activity Tolerance: Patient limited by pain Patient left: in bed;with call bell/phone within reach;with bed alarm set   PT Visit Diagnosis: Muscle weakness (generalized) (M62.81);Other abnormalities of gait and mobility (R26.89);Difficulty in walking, not elsewhere classified (R26.2);Pain Pain - Right/Left: Left Pain - part of body: Knee    Time: 1032-1050 PT Time Calculation (min) (ACUTE ONLY): 18 min   Charges:   PT Evaluation $PT Eval Moderate Complexity: 1 Mod   PT General Charges $$ ACUTE PT VISIT: 1 Visit         Micheline Portal, PT Acute Rehabilitation Services [phone] 09/09/2024   Montie Portal 09/09/2024, 2:35 PM

## 2024-09-09 NOTE — TOC Progression Note (Signed)
 Transition of Care [facility]) - Progression Note    Patient Details  Name: Jesus Lam MRN: [id number] Date of Birth: February 12, 1933  Transition of Care [employer]) CM/SW Contact  [name], RN Phone Number: 09/09/2024, 11:29 AM  Clinical Narrative:    Powell with [facility] ) is requesting a return call regarding discharge date so Powell can follow up with Dtr, [name]. [phone]   Expected Discharge Plan: Skilled Nursing Facility Barriers to Discharge: Continued Medical Work up               Expected Discharge Plan and Services   Discharge Planning Services: CM Consult   Living arrangements for the past 2 months: Apartment                                       Social Drivers of Health (SDOH) Interventions SDOH Screenings   Food Insecurity: No Food Insecurity (09/07/2024)  Housing: Low Risk  (09/07/2024)  Transportation Needs: No Transportation Needs (09/07/2024)  Utilities: Not At Risk (09/07/2024)  Alcohol Screen: Low Risk  (06/16/2024)  Depression (PHQ2-9): Low Risk  (06/16/2024)  Financial Resource Strain: Low Risk  (06/16/2024)  Physical Activity: Inactive (06/16/2024)  Social Connections: Patient Declined (09/07/2024)  Recent Concern: Social Connections - Socially Isolated (06/16/2024)  Stress: No Stress Concern Present (06/16/2024)  Tobacco Use: Medium Risk (09/07/2024)  Health Literacy: Adequate Health Literacy (06/16/2024)    Readmission Risk Interventions     No data to display

## 2024-09-10 DIAGNOSIS — I5023 Acute on chronic systolic (congestive) heart failure: Secondary | ICD-10-CM | POA: Diagnosis not present

## 2024-09-10 LAB — BASIC METABOLIC PANEL WITH GFR
Anion gap: 9 (ref 5–15)
BUN: 43 mg/dL — ABNORMAL HIGH (ref 8–23)
CO2: 24 mmol/L (ref 22–32)
Calcium: 8.3 mg/dL — ABNORMAL LOW (ref 8.9–10.3)
Chloride: 103 mmol/L (ref 98–111)
Creatinine, Ser: 1.83 mg/dL — ABNORMAL HIGH (ref 0.61–1.24)
GFR, Estimated: 35 mL/min — ABNORMAL LOW (ref >60)
Glucose, Bld: 106 mg/dL — ABNORMAL HIGH (ref 70–99)
Potassium: 3.9 mmol/L (ref 3.5–5.1)
Sodium: 136 mmol/L (ref 135–145)

## 2024-09-10 NOTE — Progress Notes (Signed)
 PROGRESS NOTE    Jesus Lam  FMW:[id number] DOB: 1933/10/12 DOA: 09/06/2024 PCP: Georgina Speaks, FNP  Subjective: Pt seen and examined. Met with pt and dtr Jesus Lam. Pt has been consuming much more than 60 ounces of fluid per day. Also eating excessive amounts of salt. Pt was living with another dtr but recently moved in with current dtr Jesus Lam. Dtr states pt's hand edema has much improved with diuresis overnight. Pt is breathing better. Confirms pt is not on home O2.   Hospital Course: CC: SOB, swelling hand and legs HPI: Jesus Lam is a 89 y.o. male with history of chronic HFrEF last EF measured was on August 23, 2024 when patient had myocardial perfusion scan which was negative for ischemia or infarct but did show EF of less than 35% with history of recurrent stroke prior history of DVT hypertension and recent diagnosis of atrial flutter presents to the ER with complaints of increasing shortness of breath and peripheral edema worsening over the last 2 weeks.  Denies any chest pain productive cough fever chills.  Patient states he has been compliant with his Lasix  20 mg daily.  He also has been more cautious about his diet recently.   ED Course: In the ER patient's EKG was showing atrial flutter controlled rate.  Chest x-ray was unremarkable.  proBNP was 846.  On exam patient has significant edema was given 20 mg IV Lasix  and admitted for further workup.  On exam patient has at least 3+ edema on both lower extremities and patient also feels his abdomen is getting distended.  He is on 2 L oxygen.  Since getting 20 mg IV Lasix  patient has put at least 500 cc out.  Admitted for CHF exacerbation.  Significant Events: Admitted 09/06/2024 for acute on chronic systolic CHF   Admission Labs: WBC 5.8, HgB 11.1, plt 149 Na 143, K 4.3, CO2 of 26, Lam 21, Scr 1.22, glu 87 T. Prot 7.9, alb 3.8, AST 20, ALT 13, alk phos 90, t. Bili 0.7 Pro BNP 846 Covid/rsv/flu negative Mg 1.9 TSH  7.531  Admission Imaging Studies: CXR Chronic interstitial basilar scarring changes and possible streaky atelectasis. No pulmonary edema or pleural effusions.  Significant Labs:   Significant Imaging Studies:   Antibiotic Therapy: Anti-infectives (From admission, onward)    None       Procedures:   Consultants: cardiology    Assessment and Plan: * Acute on chronic systolic CHF (congestive heart failure), NYHA class 3 (HCC) Due to combination of excessive fluid intake and excessive salt intake. Pt already feeling better. Pt's dtr Jesus Lam agrees. Less hand edema. Pt does weigh himself everyday. Pt now living with [facility]. She states she does not use cook with any salt in his food. Discussed that pt is limited to 60 ounces per day of ALL fluids. Not just water. ALL fluids meaning anything he can drink, including juice, coffee, soups,etc.  10/15 Transitioned from IV to PO Lasix  per Cardiology today 10/16, 10/17 Hold Lasix  with Cr rising --Monitor renal function & electrolytes closely  Weight Information (since admission)     Date/Time Weight Weight in lbs BSA (Calculated - sq m) BMI (Calculated) Who   09/07/24 0133 114 kg 251.32 lbs 2.41 sq meters 34.08 ML       Acute respiratory failure with hypoxia (HCC) 10/17 remains on 2 L/min [place] O2 with sats 91-96%% --wean O2 as tolerated   Obesity, Class I, BMI 30-34.9 Body mass index is 34.09 kg/m.    Typical atrial  flutter (HCC) 09/10/24 stable. On Eliquis .   CKD stage 3a, GFR 45-59 ml/min (HCC) - baseline Scr 1.1-1.3 09/10/24 baseline scr 1.1-1.3.  Cr bumped up 1.39 >> 1.72  >> 1.83 today --monitor renal function  --holding Lasix  and Aldactone for now  Hyperthyroidism TSH elevated 7.531 Takes tapazole  10 mg daily.  09/10/24 continue tapazole    Dysarthria as late effect of cerebellar cerebrovascular accident (CVA) 09/07/24 chronic.   Anemia Iron low 32, sat ratio 9% 10/16  Hbg stable 11.3 --Monitor  CBC  Essential hypertension 10/17 /25 BP's controlled, soft at times. Continue hydralazine . Holding amlodipine , lasix , aldactone.  Resume other GDMT as BP tolerates, per Cardiology   DVT prophylaxis:  apixaban  (ELIQUIS ) tablet 5 mg     Code Status: Full Code  Family Communication: daughter at bedside on rounds today 10/17  Disposition Plan: SNF/rehab  Reason for continuing need for hospitalization: pending cardiology clearance for discharge. Closely monitoring renal function.  Needs SNF placement for rehab.   Objective:  Vitals:   09/09/24 2327 09/10/24 0324 09/10/24 0714 09/10/24 1129  BP: 105/81 107/79 (!) 100/56 90/60  Pulse: 99 96 99 67  Resp: 19 18 18 20   Temp: 98.2 F (36.8 C) 97.8 F (36.6 C) 97.8 F (36.6 C) 97.8 F (36.6 C)  TempSrc: Oral Oral Oral Oral  SpO2: 94% 93% 97% 94%  Weight:  112 kg    Height:        Intake/Output Summary (Last 24 hours) at 09/10/2024 1514 Last data filed at 09/09/2024 2328 Gross per 24 hour  Intake 480 ml  Output 200 ml  Net 280 ml   Filed Weights   09/08/24 0502 09/09/24 0510 09/10/24 0324  Weight: 112.6 kg 112.1 kg 112 kg    Examination:  General exam: awake, alert, no acute distress,  HEENT: moist mucus membranes, hearing grossly normal  Respiratory system: lungs clear, on 2 lpm o2 [place], normal respiratory effort. Cardiovascular system: normal S1/S2, RRR, resolved BLE edema.   Gastrointestinal system: non-tender, +bowel sounds. Central nervous system: A&O x 3. no gross focal neurologic deficits, normal speech Skin: dry, intact, normal temperature Psychiatry: normal mood, congruent affect, judgement and insight appear normal   Data Reviewed: I have personally reviewed following labs and imaging studies  CBC: Recent Labs  Lab 09/06/24 1414 09/07/24 0629  WBC 5.8 6.1  NEUTROABS 2.9 3.7  HGB 11.1* 11.3*  HCT 36.2* 36.4*  MCV 93.1 92.2  PLT 179 181   Basic Metabolic Panel: Recent Labs  Lab 09/06/24 1414  09/06/24 1428 09/07/24 0330 09/08/24 0430 09/09/24 0250 09/10/24 0322  NA 143  --  140 138 139 136  K 4.3  --  4.1 3.9 4.3 3.9  CL 107  --  104 101 103 103  CO2 26  --  25 26 25 24   GLUCOSE 87  --  113* 107* 102* 106*  Lam 21  --  21 23 33* 43*  CREATININE 1.22  --  1.31* 1.39* 1.72* 1.83*  CALCIUM  9.9  --  9.3 8.9 8.5* 8.3*  MG  --  1.9 1.8 1.7 2.3  --    GFR: Estimated Creatinine Clearance: 35.4 mL/min (A) (by C-G formula based on SCr of 1.83 mg/dL (H)). Liver Function Tests: Recent Labs  Lab 09/06/24 1414 09/07/24 0330  AST 20 19  ALT 13 16  ALKPHOS 90 71  BILITOT 0.7 1.0  PROT 7.9 7.8  ALBUMIN 3.8 3.2*   Thyroid  Function Tests: Recent Labs    09/08/24 0430  FREET4 0.72       Scheduled Meds:  acetaminophen   650 mg Oral TID   apixaban   5 mg Oral BID   atorvastatin   80 mg Oral Daily   diclofenac  Sodium  2 g Topical QID   empagliflozin  10 mg Oral Daily   hydrALAZINE   50 mg Oral TID   methimazole   10 mg Oral Daily   mirtazapine   7.5 mg Oral QHS   tamsulosin   0.8 mg Oral QPC supper   Continuous Infusions:   LOS: 4 days   Time spent: 38 minutes  Burnard DELENA Cunning, DO  [facility]  09/10/2024, 3:14 PM

## 2024-09-10 NOTE — NC FL2 (Signed)
 [place]  MEDICAID FL2 LEVEL OF CARE FORM     IDENTIFICATION  Patient Name: Jesus Lam Birthdate: May 18, 1933 Sex: male Admission Date (Current Location): 09/06/2024  [facility] and [place] Number:  Producer, television/film/video and Address:  [facility] [place], [address] [place], [place] [address]      Provider Number: [id number]  Attending Physician Name and Address:  Fausto Burnard LABOR, DO  Relative Name and Phone Number:  Jesus Lam,Jesus Lam (Daughter)  [phone]    Current Level of Care: Hospital Recommended Level of Care: Skilled Nursing Facility Prior Approval Number:    Date Approved/Denied:   PASRR Number: [id number]  Discharge Plan: SNF    Current Diagnoses: Patient Active Problem List   Diagnosis Date Noted   Acute respiratory failure with hypoxia (HCC) 09/07/2024   Acute on chronic systolic CHF (congestive heart failure), NYHA class 3 (HCC) 09/06/2024   Typical atrial flutter (HCC) 07/14/2024   HFrEF (heart failure with reduced ejection fraction) (HCC) 07/14/2024   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 stage 3a chronic kidney disease (HCC) 06/30/2024   Decreased appetite 06/30/2024   Expressive aphasia 04/23/2024   Chronic HFrEF (heart failure with reduced ejection fraction) (HCC) 03/23/2024   Benign hypertensive heart and kidney disease with congestive heart failure and stage 3 chronic kidney disease (HCC) 08/26/2023   Does use hearing aid 06/17/2023   Bilateral hearing loss 06/17/2023   Obesity, Class I, BMI 30-34.9 06/17/2023   CKD stage 3a, GFR 45-59 ml/min (HCC) - baseline Scr 1.1-1.3 01/20/2023   Presbycusis of both ears 11/01/2021   Leg edema 08/01/2021   Mild cognitive impairment 09/07/2020   Chronic pain of left knee 03/15/2020   Pure hypercholesterolemia 11/15/2019   First degree AV block 07/28/2019   Hypertriglyceridemia 04/29/2019   Mixed hyperlipidemia 04/29/2019   Hyperthyroidism 11/18/2018    PAD (peripheral artery disease) 06/13/2018   Dysarthria as late effect of cerebellar cerebrovascular accident (CVA) 06/12/2018   Claudication 01/11/2018   Anemia 10/10/2017   Essential hypertension 11/04/2013    Orientation RESPIRATION BLADDER Height & Weight     Time, Self, Situation, Place  O2 (2L O2 [place]) Continent, External catheter Weight: 246 lb 14.6 oz (112 kg) Height:  6' (182.9 cm)  BEHAVIORAL SYMPTOMS/MOOD NEUROLOGICAL BOWEL NUTRITION STATUS      Continent Diet (Please see discharge summary)  AMBULATORY STATUS COMMUNICATION OF NEEDS Skin   Extensive Assist Verbally Normal                       Personal Care Assistance Level of Assistance  Bathing, Feeding, Dressing Bathing Assistance: Maximum assistance Feeding assistance: Independent Dressing Assistance: Maximum assistance     Functional Limitations Info  Sight, Hearing, Speech Sight Info: Adequate Hearing Info: Impaired Speech Info: Adequate    SPECIAL CARE FACTORS FREQUENCY  PT (By licensed PT), OT (By licensed OT)     PT Frequency: 5x week OT Frequency: 5x week            Contractures Contractures Info: Not present    Additional Factors Info  Code Status, Allergies Code Status Info: Full Allergies Info: Porcine (pork) Protein-containing Drug Products, Shellfish allergy           Current Medications (09/10/2024):  This is the current hospital active medication list Current Facility-Administered Medications  Medication Dose Route Frequency Provider Last Rate Last Admin   acetaminophen  (TYLENOL ) tablet 650 mg  650 mg Oral TID Fausto Burnard LABOR, DO  650 mg at 09/10/24 1029   apixaban  (ELIQUIS ) tablet 5 mg  5 mg Oral BID Kakrakandy, Arshad N, MD   5 mg at 09/10/24 1029   atorvastatin  (LIPITOR ) tablet 80 mg  80 mg Oral Daily Kakrakandy, Arshad N, MD   80 mg at 09/10/24 1029   diclofenac  Sodium (VOLTAREN ) 1 % topical gel 2 g  2 g Topical QID Fausto Sor A, DO   2 g at 09/10/24 1028    empagliflozin (JARDIANCE) tablet 10 mg  10 mg Oral Daily Jordan, Peter M, MD   10 mg at 09/10/24 1029   hydrALAZINE  (APRESOLINE ) tablet 50 mg  50 mg Oral TID Franky Redia SAILOR, MD   50 mg at 09/09/24 2158   methimazole  (TAPAZOLE ) tablet 10 mg  10 mg Oral Daily Franky Redia SAILOR, MD   10 mg at 09/10/24 1029   methocarbamol (ROBAXIN) tablet 500 mg  500 mg Oral Q8H PRN Segars, Jonathan, MD   500 mg at 09/09/24 0035   mirtazapine  (REMERON ) tablet 7.5 mg  7.5 mg Oral QHS Franky Redia SAILOR, MD   7.5 mg at 09/09/24 2158   Oral care mouth rinse  15 mL Mouth Rinse PRN Laurence Locus, DO       tamsulosin  (FLOMAX ) capsule 0.8 mg  0.8 mg Oral QPC supper Kakrakandy, Arshad N, MD   0.8 mg at 09/09/24 1805     Discharge Medications: Please see discharge summary for a list of discharge medications.  Relevant Imaging Results:  Relevant Lab Results:   Additional Information SS #: [id number]  Jesus Lam, LCSWA

## 2024-09-10 NOTE — Progress Notes (Signed)
 Inpatient Rehab Admissions Coordinator:   Per TOC/family request pt was screened for CIR by Reche Lowers, PT, DPT.  Chart reviewed.  Pt presented with increasing SOB and peripheral edema worsening over 2 weeks.  Admitting dx. CHF exacerbation and cardiology following for med management.  Hospital course new O2 needs, AKI.  Currently mobilizing with mod assist +2 for mobility and up to max assist with ADLs.  Therapy recommendations are for SNF.  I would not be able to get approval from [facility] for CIR with pt's dx.  If pt unable to go home would likely need to pursue SNF.    Reche Lowers, PT, DPT Admissions Coordinator [phone] 09/10/24  12:49 PM

## 2024-09-10 NOTE — TOC Progression Note (Addendum)
 Transition of Care [facility]) - Progression Note    Patient Details  Name: Jesus Lam MRN: [id number] Date of Birth: 1933-08-21  Transition [facility]) CM/SW Contact  Luise JAYSON Pan, [place] Phone Number: 09/10/2024, 12:17 PM  Clinical Narrative:   Per ICM notes, patients daughter would like CIR over SNF. CSW inquired with CIR about eligibility. Awaiting a response. CSW asked [name]'s permissions to do SNF workup while awaiting CIRs response. [name] stated okay.   12:30 PM Per chart review, patient was previously at [facility] in May 2025.   CSW will continue to follow.    Expected Discharge Plan: IP Rehab Facility Barriers to Discharge: Continued Medical Work up, Air traffic controller and Services In-house Referral: Clinical Social Work Discharge Planning Services: CM Consult Post Acute Care Choice: IP Rehab, Skilled Nursing Facility Living arrangements for the past 2 months: Apartment                                       Social Drivers of Health (SDOH) Interventions SDOH Screenings   Food Insecurity: No Food Insecurity (09/07/2024)  Housing: Low Risk  (09/07/2024)  Transportation Needs: No Transportation Needs (09/07/2024)  Utilities: Not At Risk (09/07/2024)  Alcohol Screen: Low Risk  (06/16/2024)  Depression (PHQ2-9): Low Risk  (06/16/2024)  Financial Resource Strain: Low Risk  (06/16/2024)  Physical Activity: Inactive (06/16/2024)  Social Connections: Patient Declined (09/07/2024)  Recent Concern: Social Connections - Socially Isolated (06/16/2024)  Stress: No Stress Concern Present (06/16/2024)  Tobacco Use: Medium Risk (09/07/2024)  Health Literacy: Adequate Health Literacy (06/16/2024)    Readmission Risk Interventions     No data to display

## 2024-09-10 NOTE — Progress Notes (Signed)
  Progress Note  Patient Name: Jesus Lam Date of Encounter: 09/10/2024 [facility] Cardiologist: Newman JINNY Lawrence, MD   Interval Summary    Breathing is better, leg cramps resolved.   Vital Signs Vitals:   09/09/24 1948 09/09/24 2327 09/10/24 0324 09/10/24 0714  BP: (!) 126/111 105/81 107/79 (!) 100/56  Pulse: 78 99 96 99  Resp: 20 19 18 18   Temp: 98 F (36.7 C) 98.2 F (36.8 C) 97.8 F (36.6 C) 97.8 F (36.6 C)  TempSrc: Oral Oral Oral Oral  SpO2: 92% 94% 93% 97%  Weight:   112 kg   Height:        Intake/Output Summary (Last 24 hours) at 09/10/2024 0839 Last data filed at 09/09/2024 2328 Gross per 24 hour  Intake 720 ml  Output 200 ml  Net 520 ml      09/10/2024    3:24 AM 09/09/2024    5:10 AM 09/08/2024    5:02 AM  Last 3 Weights  Weight (lbs) 246 lb 14.6 oz 247 lb 2.2 oz 248 lb 3.8 oz  Weight (kg) 112 kg 112.1 kg 112.6 kg      Telemetry/ECG  Atrial flutter rate controlled - Personally Reviewed  Physical Exam  GEN: No acute distress.   Neck: No JVD Cardiac: Irreg, no murmurs, rubs, or gallops.  Respiratory: Clear to auscultation bilaterally. GI: Soft, nontender, non-distended  MS: No edema  Assessment & Plan   Acute on Chronic HFrEF -- echo 02/2024 with LVEF of 45-50%, normal RV -- presented with several days of shortness of breath and LE edema. Hypoxic on admission requiring O2. Says he avoids salt but fluid intake has been high -- BNP 848, CXR with interstitial basilar scarring and atelectasis -- s/p IV lasix . Net - 4.5L, weight down -- recent outpatient LVEF <35% on lexiscan myoview -- Echo 10/15 LVEF 30-35%, global hypokinesis, normal RV, severely enlarged LA -- GDMT: BB stopped in the past 2/2 bradycardia/pauses. Amlodipine  held. Was started on low dose aldactone, added Entresto and received IV lasix . Lasix  and aldactone held yesterday due to rising creatinine.  -- BMET today shows creatinine up to 1.83.  -- fluid status  looks good today. Will hold Entresto and continue to monitor renal function. Would anticipate resuming Entresto once renal function improved. Would avoid aldactone.    Persistent atrial flutter -- long hx of the same, as above no BB in the setting of bradycardia. Appears he has mostly been rate controlled for quite some time -- on Eliquis  5mg  BID  - If renal function does not improve may need to reduce dose.  -- has been referred to EP outpatient, options are limited but suspect he may not be an ablation candidate    Hyperthyroidism -- follows with endocrinology outpatient on methimazole  -- TSH now 7.5 on admission -- per primary team    Hx of recurrent strokes PAD Hx of DVT   For questions or updates, please contact [facility] Please consult [email or web address] for contact info under    Signed, Cozette Braggs [place], MD

## 2024-09-11 DIAGNOSIS — I5023 Acute on chronic systolic (congestive) heart failure: Secondary | ICD-10-CM | POA: Diagnosis not present

## 2024-09-11 DIAGNOSIS — I509 Heart failure, unspecified: Secondary | ICD-10-CM | POA: Diagnosis not present

## 2024-09-11 DIAGNOSIS — J9601 Acute respiratory failure with hypoxia: Secondary | ICD-10-CM | POA: Diagnosis not present

## 2024-09-11 LAB — BASIC METABOLIC PANEL WITH GFR
Anion gap: 11 (ref 5–15)
BUN: 47 mg/dL — ABNORMAL HIGH (ref 8–23)
CO2: 26 mmol/L (ref 22–32)
Calcium: 8.7 mg/dL — ABNORMAL LOW (ref 8.9–10.3)
Chloride: 100 mmol/L (ref 98–111)
Creatinine, Ser: 1.78 mg/dL — ABNORMAL HIGH (ref 0.61–1.24)
GFR, Estimated: 36 mL/min — ABNORMAL LOW (ref >60)
Glucose, Bld: 107 mg/dL — ABNORMAL HIGH (ref 70–99)
Potassium: 4.5 mmol/L (ref 3.5–5.1)
Sodium: 137 mmol/L (ref 135–145)

## 2024-09-11 NOTE — Progress Notes (Signed)
 Progress Note   Patient: Jesus Lam FMW:[id number] DOB: 24-May-1933 DOA: 09/06/2024     5 DOS: the patient was seen and examined on 09/11/2024   Brief hospital course:  89 y.o. male with history of chronic HFrEF last EF measured was on August 23, 2024 when patient had myocardial perfusion scan which was negative for ischemia or infarct but did show EF of less than 35% with history of recurrent stroke prior history of DVT hypertension and recent diagnosis of atrial flutter presents to the ER with complaints of increasing shortness of breath and peripheral edema worsening over the last 2 weeks.  Denies any chest pain productive cough fever chills.  Patient states he has been compliant with his Lasix  20 mg daily.  He also has been more cautious about his diet recently.   ED Course: In the ER patient's EKG was showing atrial flutter controlled rate.  Chest x-ray was unremarkable.  proBNP was 846.  On exam patient has significant edema was given 20 mg IV Lasix  and admitted for further workup.  On exam patient has at least 3+ edema on both lower extremities and patient also feels his abdomen is getting distended.  He is on 2 L oxygen.  Since getting 20 mg IV Lasix  patient has put at least 500 cc out.  Admitted for CHF exacerbation.  Assessment and Plan: Acute on chronic systolic CHF (congestive heart failure), NYHA class 3 (HCC) Due to combination of excessive fluid intake and excessive salt intake. Pt already feeling better. Pt's dtr [name] agrees. Less hand edema. Pt does weigh himself everyday. Pt now living with [facility]. She states she does not use cook with any salt in his food. Discussed that pt is limited to 60 ounces per day of ALL fluids. Not just water. ALL fluids meaning anything he can drink, including juice, coffee, soups,etc.  -Diuresed with IV lasix , currently on hold for now -entresto and aldactone remains on hold given renal function     Acute respiratory failure with hypoxia  (HCC) -cont to wean O2 as tolerated     Obesity, Class I, BMI 30-34.9 Body mass index is 34.09 kg/m.      Typical atrial flutter (HCC) -Remains self-rate controlled, referral to EP as outpt noted -cont anticoag     CKD stage 3a, GFR 45-59 ml/min (HCC) - baseline Scr 1.1-1.3 09/10/24 baseline scr 1.1-1.3.  Cr peaked to 1.83, trending down today --holding Lasix  and Aldactone for now   Hyperthyroidism TSH elevated 7.531 Takes tapazole  10 mg daily.  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     Anemia Iron low 32, sat ratio 9% 10/16  Hbg stable 11.3 --Monitor CBC   Essential hypertension 10/17 /25 BP's controlled, soft at times. Continue hydralazine . Holding amlodipine , lasix , aldactone.  Resume other GDMT as BP tolerates, per Cardiology   Subjective: Without complaints this AM  Physical Exam: Vitals:   09/11/24 0451 09/11/24 0453 09/11/24 0719 09/11/24 1028  BP: 96/63  107/72 104/69  Pulse: 86 91  79  Resp: (!) 22 16    Temp: 98.3 F (36.8 C)  98.4 F (36.9 C) 98 F (36.7 C)  TempSrc: Oral  Oral Oral  SpO2: 91% 94% 94% 94%  Weight: 113.4 kg     Height:       General exam: Awake, laying in bed, in nad Respiratory system: Normal respiratory effort, no wheezing Cardiovascular system: regular rate, s1, s2 Gastrointestinal system:  Soft, nondistended, positive BS Central nervous system: CN2-12 grossly intact, strength intact Extremities: Perfused, no clubbing Skin: Normal skin turgor, no notable skin lesions seen Psychiatry: Mood normal // no visual hallucinations   Data Reviewed:  Labs reviewed: Na 137, K 4.5, Cr 1.78  Family Communication: Pt in room, fmaily not at bedside  Disposition: Status is: Inpatient Remains inpatient appropriate because: severity of illness  Planned Discharge Destination: Skilled nursing facility    Author: Garnette Pelt, MD 09/11/2024 3:41  PM  For on call review [email or web address].

## 2024-09-11 NOTE — TOC Progression Note (Addendum)
 Transition of Care [facility]) - Progression Note    Patient Details  Name: Jesus Lam MRN: [id number] Date of Birth: May 06, 1933  [facility]) CM/SW Contact  Luise JAYSON Pan, [place] Phone Number: 09/11/2024, 9:30 AM  Clinical Narrative:   CSW informed [name] that CIR is unable to accept patient at this time. She expressed her understanding verbally. CSW reviewed accepting bed offers with her, she is interested in [place] [facility] (have not accepted, status pending). CSW to reach out to [facility] AD to review referral. [name] would like her father to remain in [place] for rehab.  10:52 AM [name] asked CSW to text bed offers for SNF.   CSW notified [name] that [facility] AD, [place], stated she will review patients referral.   CSW will continue to follow.    Expected Discharge Plan: Skilled Nursing Facility  Barriers to Discharge: Continued Medical Work up, English as a second language teacher              Expected Discharge Plan and Services In-house Referral: Clinical Social Work Discharge Planning Services: CM Consult Post Acute Care Choice: IP Rehab, Skilled Nursing Facility Living arrangements for the past 2 months: Apartment                                       Social Drivers of Health (SDOH) Interventions SDOH Screenings   Food Insecurity: No Food Insecurity (09/07/2024)  Housing: Low Risk  (09/07/2024)  Transportation Needs: No Transportation Needs (09/07/2024)  Utilities: Not At Risk (09/07/2024)  Alcohol Screen: Low Risk  (06/16/2024)  Depression (PHQ2-9): Low Risk  (06/16/2024)  Financial Resource Strain: Low Risk  (06/16/2024)  Physical Activity: Inactive (06/16/2024)  Social Connections: Patient Declined (09/07/2024)  Recent Concern: Social Connections - Socially Isolated (06/16/2024)  Stress: No Stress Concern Present (06/16/2024)  Tobacco Use: Medium Risk (09/07/2024)  Health Literacy: Adequate Health Literacy (06/16/2024)    Readmission Risk Interventions      No data to display

## 2024-09-11 NOTE — Plan of Care (Signed)
  Problem: Education: Goal: Knowledge of General Education information will improve Description: Including pain rating scale, medication(s)/side effects and non-pharmacologic comfort measures Outcome: Progressing   Problem: Health Behavior/Discharge Planning: Goal: Ability to manage health-related needs will improve Outcome: Progressing   Problem: Clinical Measurements: Goal: Cardiovascular complication will be avoided Outcome: Progressing   Problem: Pain Managment: Goal: General experience of comfort will improve and/or be controlled Outcome: Progressing   Problem: Safety: Goal: Ability to remain free from injury will improve Outcome: Progressing

## 2024-09-11 NOTE — Progress Notes (Signed)
 Rounding Note   Patient Name: Jesus Lam Date of Encounter: 09/11/2024  [facility] Cardiologist: Newman JINNY Lawrence, MD   Subjective No complaints  Scheduled Meds:  acetaminophen   650 mg Oral TID   apixaban   5 mg Oral BID   atorvastatin   80 mg Oral Daily   diclofenac  Sodium  2 g Topical QID   empagliflozin  10 mg Oral Daily   hydrALAZINE   50 mg Oral TID   methimazole   10 mg Oral Daily   mirtazapine   7.5 mg Oral QHS   tamsulosin   0.8 mg Oral QPC supper   Continuous Infusions:  PRN Meds: methocarbamol, mouth rinse   Vital Signs  Vitals:   09/11/24 0002 09/11/24 0451 09/11/24 0453 09/11/24 0719  BP: (!) 111/58 96/63  107/72  Pulse: 98 86 91   Resp: 19 (!) 22 16   Temp: 98.1 F (36.7 C) 98.3 F (36.8 C)  98.4 F (36.9 C)  TempSrc: Oral Oral  Oral  SpO2: 91% 91% 94% 94%  Weight:  113.4 kg    Height:        Intake/Output Summary (Last 24 hours) at 09/11/2024 0740 Last data filed at 09/10/2024 2300 Gross per 24 hour  Intake --  Output 550 ml  Net -550 ml      09/11/2024    4:51 AM 09/10/2024    3:24 AM 09/09/2024    5:10 AM  Last 3 Weights  Weight (lbs) 250 lb 246 lb 14.6 oz 247 lb 2.2 oz  Weight (kg) 113.4 kg 112 kg 112.1 kg      Telemetry Aflutter low 100s - Personally Reviewed  ECG  N/a - Personally Reviewed  Physical Exam  GEN: No acute distress.   Neck: No JVD Cardiac: RRR, no murmurs, rubs, or gallops.  Respiratory: Clear to auscultation bilaterally. GI: Soft, nontender, non-distended  MS: No edema; No deformity. Neuro:  Nonfocal  Psych: Normal affect   Labs High Sensitivity Troponin:   Recent Labs  Lab 09/07/24 0330 09/07/24 0629  TROPONINIHS 31* 31*     Chemistry Recent Labs  Lab 09/06/24 1414 09/06/24 1428 09/07/24 0330 09/08/24 0430 09/09/24 0250 09/10/24 0322 09/11/24 0212  NA 143  --  140 138 139 136 137  K 4.3  --  4.1 3.9 4.3 3.9 4.5  CL 107  --  104 101 103 103 100  CO2 26  --  25 26 25 24 26    GLUCOSE 87  --  113* 107* 102* 106* 107*  BUN 21  --  21 23 33* 43* 47*  CREATININE 1.22  --  1.31* 1.39* 1.72* 1.83* 1.78*  CALCIUM  9.9  --  9.3 8.9 8.5* 8.3* 8.7*  MG  --    < > 1.8 1.7 2.3  --   --   PROT 7.9  --  7.8  --   --   --   --   ALBUMIN 3.8  --  3.2*  --   --   --   --   AST 20  --  19  --   --   --   --   ALT 13  --  16  --   --   --   --   ALKPHOS 90  --  71  --   --   --   --   BILITOT 0.7  --  1.0  --   --   --   --   GFRNONAA 57*  --  52* 48* 38* 35* 36*  ANIONGAP 10  --  11 11 11 9 11    < > = values in this interval not displayed.    Lipids No results for input(s): CHOL, TRIG, HDL, LABVLDL, LDLCALC, CHOLHDL in the last 168 hours.  Hematology Recent Labs  Lab 09/06/24 1414 09/07/24 0629  WBC 5.8 6.1  RBC 3.89* 3.95*  HGB 11.1* 11.3*  HCT 36.2* 36.4*  MCV 93.1 92.2  MCH 28.5 28.6  MCHC 30.7 31.0  RDW 14.9 14.9  PLT 179 181   Thyroid   Recent Labs  Lab 09/07/24 0330 09/08/24 0430  TSH 7.531*  --   FREET4  --  0.72    BNP Recent Labs  Lab 09/06/24 1414  PROBNP 846.0*    DDimer No results for input(s): DDIMER in the last 168 hours.   Radiology  No results found.    Patient Profile    Assessment & Plan  1.Acute on chronic HFrEF - 4.2025 echo LVEF 45-50%, grade I dd, normal RV - 08/2024 echo: LVEF 30-35%, normal RV -07/2024 nuclear stress: no ischemia - BNP 848, CXR with interstitial basilar scarring and atelectasis   -had been diuresed, negative 4.5L   - no beta blocker due to prior bradycardia/pauses.  - AKI, entresto and aldactone held. Ongoing AKI would continue to hold - he is on jardiance 10mg  daily - continue to follow renal function, if further improved tomorrow likely can add back diuretic and medical therapy. May consider low dose ARB instead of ARNI given soft bp's, recent AKI.   2.Persistent atrial flutter - self rate controlled, referred to EP as outpatient - he is on eliquis  5mg  bid, pending Cr trend may  need to lower dose.    3.Dilated ascending aorta - by echo 44 mm  For questions or updates, please contact [facility] Please consult [email or web address] for contact info under       Signed, Alvan Carrier, MD  09/11/2024, 7:40 AM

## 2024-09-12 DIAGNOSIS — I509 Heart failure, unspecified: Secondary | ICD-10-CM | POA: Diagnosis not present

## 2024-09-12 DIAGNOSIS — J9601 Acute respiratory failure with hypoxia: Secondary | ICD-10-CM | POA: Diagnosis not present

## 2024-09-12 DIAGNOSIS — I5023 Acute on chronic systolic (congestive) heart failure: Secondary | ICD-10-CM | POA: Diagnosis not present

## 2024-09-12 LAB — CBC
HCT: 36.7 % — ABNORMAL LOW (ref 39.0–52.0)
Hemoglobin: 11.3 g/dL — ABNORMAL LOW (ref 13.0–17.0)
MCH: 28.7 pg (ref 26.0–34.0)
MCHC: 30.8 g/dL (ref 30.0–36.0)
MCV: 93.1 fL (ref 80.0–100.0)
Platelets: 206 K/uL (ref 150–400)
RBC: 3.94 MIL/uL — ABNORMAL LOW (ref 4.22–5.81)
RDW: 14.4 % (ref 11.5–15.5)
WBC: 5.5 K/uL (ref 4.0–10.5)
nRBC: 0 % (ref 0.0–0.2)

## 2024-09-12 LAB — COMPREHENSIVE METABOLIC PANEL WITH GFR
ALT: 21 U/L (ref 0–44)
AST: 26 U/L (ref 15–41)
Albumin: 2.7 g/dL — ABNORMAL LOW (ref 3.5–5.0)
Alkaline Phosphatase: 58 U/L (ref 38–126)
Anion gap: 10 (ref 5–15)
BUN: 51 mg/dL — ABNORMAL HIGH (ref 8–23)
CO2: 25 mmol/L (ref 22–32)
Calcium: 8.7 mg/dL — ABNORMAL LOW (ref 8.9–10.3)
Chloride: 103 mmol/L (ref 98–111)
Creatinine, Ser: 1.7 mg/dL — ABNORMAL HIGH (ref 0.61–1.24)
GFR, Estimated: 38 mL/min — ABNORMAL LOW (ref >60)
Glucose, Bld: 106 mg/dL — ABNORMAL HIGH (ref 70–99)
Potassium: 4.8 mmol/L (ref 3.5–5.1)
Sodium: 138 mmol/L (ref 135–145)
Total Bilirubin: 0.7 mg/dL (ref 0.0–1.2)
Total Protein: 7.3 g/dL (ref 6.5–8.1)

## 2024-09-12 LAB — MAGNESIUM: Magnesium: 2.3 mg/dL (ref 1.7–2.4)

## 2024-09-12 MED ORDER — TRAMADOL HCL 50 MG PO TABS
50.0000 mg | ORAL_TABLET | Freq: Four times a day (QID) | ORAL | Status: DC | PRN
  Administered 2024-09-12 – 2024-09-15 (×6): 50 mg via ORAL
  Filled 2024-09-12 (×7): qty 1

## 2024-09-12 MED ORDER — APIXABAN 2.5 MG PO TABS
2.5000 mg | ORAL_TABLET | Freq: Two times a day (BID) | ORAL | Status: DC
  Administered 2024-09-12 (×2): 2.5 mg via ORAL
  Filled 2024-09-12 (×2): qty 1

## 2024-09-12 NOTE — Progress Notes (Signed)
 Progress Note   Patient: Jesus Lam FMW:[id number] DOB: [birth year] DOA: 09/06/2024     6 DOS: the patient was seen and examined on 09/12/2024   Brief hospital course:  89 y.o. male with history of chronic HFrEF last EF measured was on August 23, 2024 when patient had myocardial perfusion scan which was negative for ischemia or infarct but did show EF of less than 35% with history of recurrent stroke prior history of DVT hypertension and recent diagnosis of atrial flutter presents to the ER with complaints of increasing shortness of breath and peripheral edema worsening over the last 2 weeks.  Denies any chest pain productive cough fever chills.  Patient states he has been compliant with his Lasix  20 mg daily.  He also has been more cautious about his diet recently.   ED Course: In the ER patient's EKG was showing atrial flutter controlled rate.  Chest x-ray was unremarkable.  proBNP was 846.  On exam patient has significant edema was given 20 mg IV Lasix  and admitted for further workup.  On exam patient has at least 3+ edema on both lower extremities and patient also feels his abdomen is getting distended.  He is on 2 L oxygen.  Since getting 20 mg IV Lasix  patient has put at least 500 cc out.  Admitted for CHF exacerbation.  Assessment and Plan: Acute on chronic systolic CHF (congestive heart failure), NYHA class 3 (HCC) Due to combination of excessive fluid intake and excessive salt intake. Pt already feeling better. Pt's dtr [name] agrees. Less hand edema. Pt does weigh himself everyday. Pt now living with [facility]. She states she does not use cook with any salt in his food. Discussed that pt is limited to 60 ounces per day of ALL fluids. Not just water. ALL fluids meaning anything he can drink, including juice, coffee, soups,etc.  -Diuresed with IV lasix , currently remains on hold for now -entresto and aldactone remains on hold given renal function     Acute respiratory failure with  hypoxia (HCC) -cont to wean O2 as tolerated     Obesity, Class I, BMI 30-34.9 Body mass index is 34.09 kg/m.      Typical atrial flutter (HCC) -Remains self-rate controlled, referral to EP as outpt noted -cont anticoag     CKD stage 3a, GFR 45-59 ml/min (HCC) - baseline Scr 1.1-1.3 09/10/24 baseline scr 1.1-1.3.  Cr peaked to 1.83, trending down today --holding Lasix  and Aldactone for now   Hyperthyroidism TSH elevated 7.531 Takes tapazole  10 mg daily.  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     Anemia Iron low 32, sat ratio 9% 10/16  Hbg stable 11.3 --Monitor CBC   Essential hypertension 10/17 /25 BP's controlled, soft at times. Continue hydralazine . Holding amlodipine , lasix , aldactone.  Resume other GDMT as BP tolerates, per Cardiology   Subjective: complaining of LE pains  Physical Exam: Vitals:   09/12/24 0825 09/12/24 0855 09/12/24 1138 09/12/24 1500  BP: 116/63  106/65 (!) 151/99  Pulse: 94  62 74  Resp: 16 17 17 16   Temp: 98.2 F (36.8 C)  98.7 F (37.1 C) 98.1 F (36.7 C)  TempSrc: Oral  Axillary Oral  SpO2: 95%  91% 93%  Weight:      Height:       General exam: Conversant, in no acute distress Respiratory system: normal chest rise, clear, no audible wheezing Cardiovascular system: regular rhythm, s1-s2 Gastrointestinal system:  Nondistended, nontender, pos BS Central nervous system: No seizures, no tremors Extremities: No cyanosis, no joint deformities Skin: No rashes, no pallor Psychiatry: Affect normal // no auditory hallucinations   Data Reviewed:  Labs reviewed: Na 138, K 4.8, Cr 1.70, WBC 5.5, Hgb 11.3, Plts 206  Family Communication: Pt in room, fmaily not at bedside  Disposition: Status is: Inpatient Remains inpatient appropriate because: severity of illness  Planned Discharge Destination: Skilled nursing facility    Author: Garnette Pelt,  MD 09/12/2024 4:25 PM  For on call review [email or web address].

## 2024-09-12 NOTE — Progress Notes (Signed)
 Rounding Note   Patient Name: Jesus Lam Date of Encounter: 09/12/2024  [facility] Cardiologist: Newman JINNY Lawrence, MD   Subjective No complaints  Scheduled Meds:  acetaminophen   650 mg Oral TID   apixaban   5 mg Oral BID   atorvastatin   80 mg Oral Daily   diclofenac  Sodium  2 g Topical QID   empagliflozin  10 mg Oral Daily   hydrALAZINE   50 mg Oral TID   methimazole   10 mg Oral Daily   mirtazapine   7.5 mg Oral QHS   tamsulosin   0.8 mg Oral QPC supper   Continuous Infusions:  PRN Meds: methocarbamol, mouth rinse   Vital Signs  Vitals:   09/11/24 1629 09/11/24 1948 09/12/24 0001 09/12/24 0340  BP: 125/80 118/73 115/77 128/75  Pulse:  99 90 83  Resp:  17 19 18   Temp:  98 F (36.7 C) 98 F (36.7 C) 97.9 F (36.6 C)  TempSrc:  Oral Oral Oral  SpO2:  95% 95% 95%  Weight:    114.5 kg  Height:        Intake/Output Summary (Last 24 hours) at 09/12/2024 0809 Last data filed at 09/12/2024 9287 Gross per 24 hour  Intake 720 ml  Output 1500 ml  Net -780 ml      09/12/2024    3:40 AM 09/11/2024    4:51 AM 09/10/2024    3:24 AM  Last 3 Weights  Weight (lbs) 252 lb 6.8 oz 250 lb 246 lb 14.6 oz  Weight (kg) 114.5 kg 113.4 kg 112 kg      Telemetry Rate controlled aflutter - Personally Reviewed  ECG  N/a - Personally Reviewed  Physical Exam  GEN: No acute distress.   Neck: No JVD Cardiac: irreg.  Respiratory: Clear to auscultation bilaterally. GI: Soft, nontender, non-distended  MS: No edema; No deformity. Neuro:  Nonfocal  Psych: Normal affect   Labs High Sensitivity Troponin:   Recent Labs  Lab 09/07/24 0330 09/07/24 0629  TROPONINIHS 31* 31*     Chemistry Recent Labs  Lab 09/06/24 1414 09/06/24 1428 09/07/24 0330 09/08/24 0430 09/09/24 0250 09/10/24 0322 09/11/24 0212 09/12/24 0219  NA 143  --  140 138 139 136 137 138  K 4.3  --  4.1 3.9 4.3 3.9 4.5 4.8  CL 107  --  104 101 103 103 100 103  CO2 26  --  25 26 25 24  26 25   GLUCOSE 87  --  113* 107* 102* 106* 107* 106*  BUN 21  --  21 23 33* 43* 47* 51*  CREATININE 1.22  --  1.31* 1.39* 1.72* 1.83* 1.78* 1.70*  CALCIUM  9.9  --  9.3 8.9 8.5* 8.3* 8.7* 8.7*  MG  --    < > 1.8 1.7 2.3  --   --  2.3  PROT 7.9  --  7.8  --   --   --   --  7.3  ALBUMIN 3.8  --  3.2*  --   --   --   --  2.7*  AST 20  --  19  --   --   --   --  26  ALT 13  --  16  --   --   --   --  21  ALKPHOS 90  --  71  --   --   --   --  58  BILITOT 0.7  --  1.0  --   --   --   --  0.7  GFRNONAA 57*  --  52* 48* 38* 35* 36* 38*  ANIONGAP 10  --  11 11 11 9 11 10    < > = values in this interval not displayed.    Lipids No results for input(s): CHOL, TRIG, HDL, LABVLDL, LDLCALC, CHOLHDL in the last 168 hours.  Hematology Recent Labs  Lab 09/06/24 1414 09/07/24 0629 09/12/24 0219  WBC 5.8 6.1 5.5  RBC 3.89* 3.95* 3.94*  HGB 11.1* 11.3* 11.3*  HCT 36.2* 36.4* 36.7*  MCV 93.1 92.2 93.1  MCH 28.5 28.6 28.7  MCHC 30.7 31.0 30.8  RDW 14.9 14.9 14.4  PLT 179 181 206   Thyroid   Recent Labs  Lab 09/07/24 0330 09/08/24 0430  TSH 7.531*  --   FREET4  --  0.72    BNP Recent Labs  Lab 09/06/24 1414  PROBNP 846.0*    DDimer No results for input(s): DDIMER in the last 168 hours.   Radiology  No results found.    Patient Profile   Jesus Lam is a 89 y.o. male with a hx of HTN, HLD, PAD, h/o DVT, recurrent stroke, HFrEF, persistent atrial flutter who is being seen 09/07/2024 for the evaluation of CHF at the request of Dr. Laurence.   Assessment & Plan  1.Acute on chronic HFrEF - 4.2025 echo LVEF 45-50%, grade I dd, normal RV - 08/2024 echo: LVEF 30-35%, normal RV -07/2024 nuclear stress: no ischemia - BNP 848, CXR with interstitial basilar scarring and atelectasis    -had been diuresed, negative 4.5L. Held due to AKI  - no beta blocker due to prior bradycardia/pauses.  - AKI, entresto and aldactone held. Ongoing AKI would continue to hold - he is on  jardiance 10mg  daily - continue to follow renal function, if further improved tomorrow likely can add back diuretic and medical therapy. May consider low dose ARB instead of ARNI given soft bp's, recent AKI.    2.Persistent atrial flutter - self rate controlled, referred to EP as outpatient - he is on eliquis  5mg  bid, pending Cr trend may need to lower dose. Has been above 1.5 the last 4 days, will go ahead and lower eliquis  to 2.5mg  bid.      3.Dilated ascending aorta - by echo 44 mm    For questions or updates, please contact [facility] Please consult [email or web address] for contact info under       Signed, Alvan Carrier, MD  09/12/2024, 8:09 AM

## 2024-09-12 NOTE — Progress Notes (Signed)
 Pt ambulated in hall 400 feet with front wheel rolling walker on 3L [place] with RN. Pt c/o knee pain during. VSS.

## 2024-09-13 ENCOUNTER — Telehealth (HOSPITAL_COMMUNITY): Payer: Self-pay

## 2024-09-13 ENCOUNTER — Other Ambulatory Visit (HOSPITAL_COMMUNITY): Payer: Self-pay

## 2024-09-13 DIAGNOSIS — I5023 Acute on chronic systolic (congestive) heart failure: Secondary | ICD-10-CM | POA: Diagnosis not present

## 2024-09-13 DIAGNOSIS — J9601 Acute respiratory failure with hypoxia: Secondary | ICD-10-CM | POA: Diagnosis not present

## 2024-09-13 DIAGNOSIS — I509 Heart failure, unspecified: Secondary | ICD-10-CM | POA: Diagnosis not present

## 2024-09-13 LAB — COMPREHENSIVE METABOLIC PANEL WITH GFR
ALT: 20 U/L (ref 0–44)
AST: 21 U/L (ref 15–41)
Albumin: 2.7 g/dL — ABNORMAL LOW (ref 3.5–5.0)
Alkaline Phosphatase: 50 U/L (ref 38–126)
Anion gap: 9 (ref 5–15)
BUN: 41 mg/dL — ABNORMAL HIGH (ref 8–23)
CO2: 24 mmol/L (ref 22–32)
Calcium: 8.9 mg/dL (ref 8.9–10.3)
Chloride: 105 mmol/L (ref 98–111)
Creatinine, Ser: 1.47 mg/dL — ABNORMAL HIGH (ref 0.61–1.24)
GFR, Estimated: 45 mL/min — ABNORMAL LOW (ref >60)
Glucose, Bld: 99 mg/dL (ref 70–99)
Potassium: 4.5 mmol/L (ref 3.5–5.1)
Sodium: 138 mmol/L (ref 135–145)
Total Bilirubin: 0.7 mg/dL (ref 0.0–1.2)
Total Protein: 7.4 g/dL (ref 6.5–8.1)

## 2024-09-13 LAB — CBC
HCT: 35.4 % — ABNORMAL LOW (ref 39.0–52.0)
Hemoglobin: 10.9 g/dL — ABNORMAL LOW (ref 13.0–17.0)
MCH: 28.7 pg (ref 26.0–34.0)
MCHC: 30.8 g/dL (ref 30.0–36.0)
MCV: 93.2 fL (ref 80.0–100.0)
Platelets: 216 K/uL (ref 150–400)
RBC: 3.8 MIL/uL — ABNORMAL LOW (ref 4.22–5.81)
RDW: 14.5 % (ref 11.5–15.5)
WBC: 4.5 K/uL (ref 4.0–10.5)
nRBC: 0 % (ref 0.0–0.2)

## 2024-09-13 LAB — URINALYSIS, ROUTINE W REFLEX MICROSCOPIC
Bilirubin Urine: NEGATIVE
Glucose, UA: >=500 mg/dL — AB
Ketones, ur: NEGATIVE mg/dL
Nitrite: NEGATIVE
Protein, ur: 30 mg/dL — AB
Specific Gravity, Urine: 1.013 (ref 1.005–1.030)
WBC, UA: >50 WBC/hpf (ref 0–5)
pH: 6 (ref 5.0–8.0)

## 2024-09-13 MED ORDER — FUROSEMIDE 10 MG/ML IJ SOLN
20.0000 mg | Freq: Once | INTRAMUSCULAR | Status: AC
  Administered 2024-09-13: 20 mg via INTRAVENOUS
  Filled 2024-09-13: qty 2

## 2024-09-13 MED ORDER — APIXABAN 5 MG PO TABS
5.0000 mg | ORAL_TABLET | Freq: Two times a day (BID) | ORAL | Status: DC
  Administered 2024-09-13 – 2024-09-17 (×9): 5 mg via ORAL
  Filled 2024-09-13 (×9): qty 1

## 2024-09-13 MED ORDER — SODIUM CHLORIDE 0.9 % IV SOLN
1.0000 g | INTRAVENOUS | Status: DC
  Administered 2024-09-13 – 2024-09-14 (×2): 1 g via INTRAVENOUS
  Filled 2024-09-13 (×2): qty 10

## 2024-09-13 NOTE — Progress Notes (Addendum)
 Progress Note  Patient Name: Jesus Lam Date of Encounter: 09/13/2024 [facility] Cardiologist: Newman JINNY Lawrence, MD   Interval Summary   Shortness of breath is improving.  Creatinine is also improving.  He has no significant symptoms today.  Weight also improving.  Vital Signs Vitals:   09/12/24 2337 09/13/24 0451 09/13/24 0504 09/13/24 0736  BP: (!) 140/88 (!) 140/89  123/84  Pulse: 90 89  (!) 105  Resp: 16 19  14   Temp: 97.6 F (36.4 C) 98.4 F (36.9 C)  98.3 F (36.8 C)  TempSrc: Oral Oral  Oral  SpO2: 94% 93%  93%  Weight:   110.5 kg   Height:        Intake/Output Summary (Last 24 hours) at 09/13/2024 0823 Last data filed at 09/13/2024 0453 Gross per 24 hour  Intake 240 ml  Output 1700 ml  Net -1460 ml      09/13/2024    5:04 AM 09/12/2024    3:40 AM 09/11/2024    4:51 AM  Last 3 Weights  Weight (lbs) 243 lb 9.7 oz 252 lb 6.8 oz 250 lb  Weight (kg) 110.5 kg 114.5 kg 113.4 kg      Telemetry/ECG  Atrial fibrillation heart rates between 80-100- Personally Reviewed  Physical Exam  GEN: No acute distress.   Neck: + JVD Cardiac: IRRR, no murmurs, rubs, or gallops.  Respiratory: Clear to auscultation bilaterally. GI: Distended abdomen MS: Mild edema  Patient Profile Patient with past medical history significant for chronic HFrEF, persistent atrial fibrillation, PAD with SFA stenting, DVT, recurrent stroke, aortic dilatation.  Patient admitted for CHF exacerbation going on for the past 4 days and weight gain.  Assessment & Plan   Chronic HFrEF NICM -12/2017 [facility] mild nonobstructive disease -02/2024 EF 45 to 50% -07/2024 normal stress test Echocardiogram this admission shows further reduction in EF 30 to 35% with global hypokinesis and normal RV function with moderately severely dilated atria.  Mild MR.  However he does have recent reassuring stress testing recently.  Weight on admission 251.  Creatinine and weights have been improving  with gentle diuresis with just Jardiance no Lasix .  Weight today is 243 pounds.  Still looks slightly volume up with JVD and gut edema however he is without any significant symptoms or orthopnea.  Negative almost 3 L last 24 hours. GDMT has been limited by hypotension during this admission.  Did not tolerate Entresto and spironolactone.  Beta-blocker held for pauses noted on heart monitor this year.  BP improved today. Currently he is on hydralazine  50 mg 3 times daily, Jardiance 10 mg.  Would continue current regimen as he is self diuresing and making improvements. Will titrate GDMT more once renal function stabilizes.  Persistent atrial fibrillation Heart monitor 05/2024 showed 100% burden with 3-second pauses.  Rate controlled currently right now with heart rates generally less than 110. Continue with Eliquis  5 mg twice daily, also has indications given VTE. He will follow-up with EP to discuss further management of atrial fibrillation.  Of note he does have moderate to severely dilated LA.  Presumed to be new diagnosis of send/2025.  Dilated aorta 44 mm based off of echocardiogram here.  Continue to monitor annually outpatient.  CKD He had AKI with diuresis.  Creatinine typically seems to be around 1.3.  It has been trending down for multiple days now currently 1.47.  Hyperthyroidism May be contributing to flutter.  Had elevated TSH 7.5 on admission.  He is on methimazole   chronically.  For questions or updates, please contact [facility] Please consult [email or web address] for contact info under       Signed, Thom LITTIE Sluder, PA-C     I have seen and examined the patient along with Thom LITTIE Sluder, PA-C  .  I have reviewed the chart, notes and new data.  I agree with PA/NP's note.  Key new complaints: slept better last night, still had HOB elevated Key examination changes: clear lungs, irregular rhythm, no S3, no edema, but JVP 5-6 cm sitting upright Key new findings / data: improving  renal parameters in face of good diuresis. In 2020 there was no evidence of renal artery stenosis by US .  PLAN: Improved, but still hypervolemic. Renal parameters steadily improving. Continue Jardiance and hydralazine -nitrates.  Add back loop diuretic today. Consider ARB (not Entresto) tomorrow).  Not on beta blockers due to bradycardia/pauses in the past.  Jerel Balding, MD, [facility] [phone] 09/13/2024, 11:04 AM

## 2024-09-13 NOTE — Progress Notes (Signed)
 Physical Therapy Treatment Patient Details Name: Jesus Lam MRN: [id number] DOB: June 23, 1933 Today's Date: 09/13/2024   History of Present Illness Pt is an 89 y/o male admitted due to acute CHF exacerbation. PMH: atrial flutter, CHF, CVA, hyperthyroidism, HTN, BPH    PT Comments  Progressing well towards acute functional goals.  Still having a bit of knee pain with transfers and gait, requiring min assist and RW to mobilize safely. Slow and antalgic gait pattern, needs frequent cues for upright posture to maximize RW effectiveness. SpO2 90% and greater on RA throughout session. Reviewed LE exercises and performed without issues. Patient will continue to benefit from skilled physical therapy services to further improve independence with functional mobility. Patient will benefit from continued inpatient follow up therapy, <3 hours/day    If plan is discharge home, recommend the following: A lot of help with walking and/or transfers;A little help with walking and/or transfers;A little help with bathing/dressing/bathroom;Assistance with cooking/housework;Assist for transportation;Supervision due to cognitive status   Can travel by private vehicle     Yes  Equipment Recommendations  None recommended by PT    Recommendations for Other Services       Precautions / Restrictions Precautions Precautions: Fall Recall of Precautions/Restrictions: Intact Restrictions Weight Bearing Restrictions Per Provider Order: No     Mobility  Bed Mobility               General bed mobility comments: in recliner    Transfers Overall transfer level: Needs assistance Equipment used: Rolling walker (2 wheels) Transfers: Sit to/from Stand, Bed to chair/wheelchair/BSC Sit to Stand: Min assist           General transfer comment: Min assist for boost to stand, cues for hand placement, limited Lt knee flexion and WB initially as he rose. Min assist for controlled descent into chair. RW to  steady upon rising.    Ambulation/Gait Ambulation/Gait assistance: Min assist Gait Distance (Feet): 100 Feet Assistive device: Rolling walker (2 wheels) Gait Pattern/deviations: Step-through pattern, Decreased stride length, Trunk flexed, Antalgic Gait velocity: dec Gait velocity interpretation: <1.31 ft/sec, indicative of household ambulator   General Gait Details: Fairly antalgic pattern, requires cues for upright posture and intermittent min assist for RW control and stabiilty. Educated on gait symmetry and step length. SpO2 90% and greater on room air throughout with minimal dyspnea.   Stairs             Wheelchair Mobility     Tilt Bed    Modified Rankin (Stroke Patients Only)       Balance Overall balance assessment: Needs assistance Sitting-balance support: Feet supported Sitting balance-Leahy Scale: Fair     Standing balance support: Bilateral upper extremity supported Standing balance-Leahy Scale: Poor Standing balance comment: BIL UEs for support.                            Communication Communication Communication: Impaired Factors Affecting Communication: Reduced clarity of speech  Cognition Arousal: Alert Behavior During Therapy: WFL for tasks assessed/performed   PT - Cognitive impairments: Awareness, Memory, Problem solving, Safety/Judgement                         Following commands: Impaired Following commands impaired: Follows one step commands with increased time, Only follows one step commands consistently    Cueing Cueing Techniques: Verbal cues, Gestural cues  Exercises General Exercises - Lower Extremity Ankle Circles/Pumps: AROM, Both, 15 reps,  Seated Long Arc Quad: Strengthening, Both, 15 reps, Seated    General Comments General comments (skin integrity, edema, etc.): VSS on RA throughout visit.      Pertinent Vitals/Pain Pain Assessment Pain Assessment: Faces Faces Pain Scale: Hurts little  more Breathing: normal Pain Location: L knee>R Pain Descriptors / Indicators: Grimacing, Guarding, Aching Pain Intervention(s): Limited activity within patient's tolerance, Monitored during session, Repositioned    Home Living                          Prior Function            PT Goals (current goals can now be found in the care plan section) Acute Rehab PT Goals Patient Stated Goal: get up easier PT Goal Formulation: With patient Time For Goal Achievement: 09/23/24 Potential to Achieve Goals: Fair Progress towards PT goals: Progressing toward goals    Frequency    Min 2X/week      PT Plan      Co-evaluation              AM-PAC PT 6 Clicks Mobility   Outcome Measure  Help needed turning from your back to your side while in a flat bed without using bedrails?: A Lot Help needed moving from lying on your back to sitting on the side of a flat bed without using bedrails?: A Lot Help needed moving to and from a bed to a chair (including a wheelchair)?: A Lot Help needed standing up from a chair using your arms (e.g., wheelchair or bedside chair)?: A Little Help needed to walk in hospital room?: A Little Help needed climbing 3-5 steps with a railing? : Total 6 Click Score: 13    End of Session Equipment Utilized During Treatment: Gait belt Activity Tolerance: Patient tolerated treatment well Patient left: with call bell/phone within reach;in chair;with family/visitor present Nurse Communication: Mobility status PT Visit Diagnosis: Muscle weakness (generalized) (M62.81);Other abnormalities of gait and mobility (R26.89);Difficulty in walking, not elsewhere classified (R26.2);Pain Pain - Right/Left: Left Pain - part of body: Knee     Time: 1140-1157 PT Time Calculation (min) (ACUTE ONLY): 17 min  Charges:    $Gait Training: 8-22 mins PT General Charges $$ ACUTE PT VISIT: 1 Visit                     Leontine Roads, PT, DPT [facility]   Rehabilitation Services Physical Therapist Office: [phone] Website: [email or web address]    Leontine GORMAN Roads 09/13/2024, 1:20 PM

## 2024-09-13 NOTE — Progress Notes (Signed)
 Progress Note   Patient: Jesus Lam FMW:[id number] DOB: [birth year] DOA: 09/06/2024     7 DOS: the patient was seen and examined on 09/13/2024   Brief hospital course:  89 y.o. male with history of chronic HFrEF last EF measured was on August 23, 2024 when patient had myocardial perfusion scan which was negative for ischemia or infarct but did show EF of less than 35% with history of recurrent stroke prior history of DVT hypertension and recent diagnosis of atrial flutter presents to the ER with complaints of increasing shortness of breath and peripheral edema worsening over the last 2 weeks.  Denies any chest pain productive cough fever chills.  Patient states he has been compliant with his Lasix  20 mg daily.  He also has been more cautious about his diet recently.   ED Course: In the ER patient's EKG was showing atrial flutter controlled rate.  Chest x-ray was unremarkable.  proBNP was 846.  On exam patient has significant edema was given 20 mg IV Lasix  and admitted for further workup.  On exam patient has at least 3+ edema on both lower extremities and patient also feels his abdomen is getting distended.  He is on 2 L oxygen.  Since getting 20 mg IV Lasix  patient has put at least 500 cc out.  Admitted for CHF exacerbation.  Assessment and Plan: Acute on chronic systolic CHF (congestive heart failure), NYHA class 3 (HCC) Due to combination of excessive fluid intake and excessive salt intake. Pt already feeling better. Pt's dtr [name] agrees. Less hand edema. Pt does weigh himself everyday. Pt now living with [facility]. She states she does not use cook with any salt in his food. Discussed that pt is limited to 60 ounces per day of ALL fluids. Not just water. ALL fluids meaning anything he can drink, including juice, coffee, soups,etc.  -Given dose of lasix  today -entresto and aldactone remains on hold given renal function. Consider ARB tomorrow -Jardiance now stopped given below UTI     Acute  respiratory failure with hypoxia (HCC) -cont to wean O2 as tolerated     Obesity, Class I, BMI 30-34.9 Body mass index is 34.09 kg/m.      Typical atrial flutter (HCC) -Remains self-rate controlled, referral to EP as outpt noted -cont anticoag     CKD stage 3a, GFR 45-59 ml/min (HCC) - baseline Scr 1.1-1.3 09/10/24 baseline scr 1.1-1.3.  Cr peaked to 1.83, now trending down Dose of lasix  today per Cardiology   Hyperthyroidism TSH elevated 7.531 Takes tapazole  10 mg daily.  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     Anemia Iron low 32, sat ratio 9% 10/16  Hbg stable  --Monitor CBC   Essential hypertension 10/17 /25 BP's controlled, soft at times. Continue hydralazine . Holding amlodipine , aldactone.   -Given dose of lasix  per Cardiology  Symptomatic UTI  - Pt with new dysuria   -UA is suggestive of UTI  -f/u on urine cx  -cont on empiric rocephin    Subjective: complaining of much pain on urination  Physical Exam: Vitals:   09/13/24 0504 09/13/24 0736 09/13/24 1020 09/13/24 1125  BP:  123/84 132/79 139/80  Pulse:  (!) 105  (!) 104  Resp:  14  20  Temp:  98.3 F (36.8 C)  98.1 F (36.7 C)  TempSrc:  Oral  Oral  SpO2:  93%  93%  Weight: 110.5 kg     Height:  General exam: Awake, laying in bed, in nad Respiratory system: Normal respiratory effort, no wheezing Cardiovascular system: regular rate, s1, s2 Gastrointestinal system: Soft, nondistended, positive BS Central nervous system: CN2-12 grossly intact, strength intact Extremities: Perfused, no clubbing Skin: Normal skin turgor, no notable skin lesions seen Psychiatry: Mood normal // no visual hallucinations   Data Reviewed:  Labs reviewed: Na 138, K 4.5, Cr 1.47, WBC 4.5, Hgb 10.9, Plts 216  Family Communication: Pt in room, fmaily at bedside  Disposition: Status is: Inpatient Remains inpatient appropriate  because: severity of illness  Planned Discharge Destination: Skilled nursing facility    Author: Garnette Pelt, MD 09/13/2024 2:37 PM  For on call review [email or web address].

## 2024-09-13 NOTE — Telephone Encounter (Signed)
 Pharmacy Patient Advocate Encounter  Pharmacy Patient Advocate Encounter  Insurance verification completed.    The patient is insured through [employer]. Patient has Medicare and is not eligible for a copay card, but may be able to apply for patient assistance or Medicare RX Payment Plan (Patient Must reach out to their plan, if eligible for payment plan), if available.    Ran test claim for Bidil  20-37.5mg  and the current 30 day co-pay is $0.   This test claim was processed through [facility]- copay amounts may vary at other pharmacies due to [employer], or as the patient moves through the different stages of their insurance plan.

## 2024-09-13 NOTE — Plan of Care (Signed)

## 2024-09-13 NOTE — TOC Progression Note (Addendum)
 Transition of Care [facility]) - Progression Note    Patient Details  Name: Jesus Lam MRN: [id number] Date of Birth: [birth year]  [facility]) CM/SW Contact  Isaiah Public, LCSWA Phone Number: 09/13/2024, 12:29 PM  Clinical Narrative:     CSW LVM for patients daughter [name]. CSW awaiting call back to discuss patients SNF bed offers. CSW will continue to follow.  Update- CSW received call back from patients daughter [name] regarding SNF choice. [name] informed CSW that patient is doing better and that [facility] was supposed to be doing a reevaluation to see if candidate for CIR now. She said CIR is first choice. CSW provided SNF bed offers for back up plan to CIR. Patients daughter said that she will review SNF offers, but is hopeful that patient will be able to go to [facility]. CSW informed Leita with [facility]. CSW will continue to follow.  Update- CSW informed patients daughter [name] that Leita with [facility] informed CSW that from 10/17 note indicates that therapy recommends SNF and [employer] will not approve. [name] informed CSW that she accepts [facility] as SNF choice for patient. CSW informed [place] with [facility]. CSW following to start insurance authorization for patient, closer to patient being medically ready.  Expected Discharge Plan: Skilled Nursing Facility Barriers to Discharge: Continued Medical Work up, English as a second language teacher               Expected Discharge Plan and Services In-house Referral: Clinical Social Work Discharge Planning Services: CM Consult Post Acute Care Choice: IP Rehab, Skilled Nursing Facility Living arrangements for the past 2 months: Apartment                                       Social Drivers of Health (SDOH) Interventions SDOH Screenings   Food Insecurity: No Food Insecurity (09/07/2024)  Housing: Low Risk  (09/07/2024)  Transportation Needs: No Transportation Needs (09/07/2024)  Utilities: Not At Risk (09/07/2024)  Alcohol  Screen: Low Risk  (06/16/2024)  Depression (PHQ2-9): Low Risk  (06/16/2024)  Financial Resource Strain: Low Risk  (06/16/2024)  Physical Activity: Inactive (06/16/2024)  Social Connections: Patient Declined (09/07/2024)  Recent Concern: Social Connections - Socially Isolated (06/16/2024)  Stress: No Stress Concern Present (06/16/2024)  Tobacco Use: Medium Risk (09/07/2024)  Health Literacy: Adequate Health Literacy (06/16/2024)    Readmission Risk Interventions     No data to display

## 2024-09-14 DIAGNOSIS — J9601 Acute respiratory failure with hypoxia: Secondary | ICD-10-CM | POA: Diagnosis not present

## 2024-09-14 DIAGNOSIS — I509 Heart failure, unspecified: Secondary | ICD-10-CM | POA: Diagnosis not present

## 2024-09-14 DIAGNOSIS — I5023 Acute on chronic systolic (congestive) heart failure: Secondary | ICD-10-CM | POA: Diagnosis not present

## 2024-09-14 LAB — BASIC METABOLIC PANEL WITH GFR
Anion gap: 8 (ref 5–15)
BUN: 31 mg/dL — ABNORMAL HIGH (ref 8–23)
CO2: 25 mmol/L (ref 22–32)
Calcium: 9.3 mg/dL (ref 8.9–10.3)
Chloride: 103 mmol/L (ref 98–111)
Creatinine, Ser: 1.39 mg/dL — ABNORMAL HIGH (ref 0.61–1.24)
GFR, Estimated: 48 mL/min — ABNORMAL LOW (ref >60)
Glucose, Bld: 111 mg/dL — ABNORMAL HIGH (ref 70–99)
Potassium: 4.5 mmol/L (ref 3.5–5.1)
Sodium: 136 mmol/L (ref 135–145)

## 2024-09-14 LAB — CBC
HCT: 36.1 % — ABNORMAL LOW (ref 39.0–52.0)
Hemoglobin: 11.2 g/dL — ABNORMAL LOW (ref 13.0–17.0)
MCH: 28.3 pg (ref 26.0–34.0)
MCHC: 31 g/dL (ref 30.0–36.0)
MCV: 91.2 fL (ref 80.0–100.0)
Platelets: 229 K/uL (ref 150–400)
RBC: 3.96 MIL/uL — ABNORMAL LOW (ref 4.22–5.81)
RDW: 14.4 % (ref 11.5–15.5)
WBC: 4.1 K/uL (ref 4.0–10.5)
nRBC: 0 % (ref 0.0–0.2)

## 2024-09-14 MED ORDER — LOSARTAN POTASSIUM 25 MG PO TABS
25.0000 mg | ORAL_TABLET | Freq: Every day | ORAL | Status: DC
  Administered 2024-09-14 – 2024-09-17 (×4): 25 mg via ORAL
  Filled 2024-09-14 (×4): qty 1

## 2024-09-14 MED ORDER — FUROSEMIDE 20 MG PO TABS
20.0000 mg | ORAL_TABLET | Freq: Every day | ORAL | Status: DC
  Administered 2024-09-14: 20 mg via ORAL
  Filled 2024-09-14: qty 1

## 2024-09-14 MED ORDER — CARVEDILOL 3.125 MG PO TABS
3.1250 mg | ORAL_TABLET | Freq: Two times a day (BID) | ORAL | Status: DC
  Administered 2024-09-14 – 2024-09-17 (×6): 3.125 mg via ORAL
  Filled 2024-09-14 (×7): qty 1

## 2024-09-14 NOTE — TOC Progression Note (Signed)
 Transition of Care [facility]) - Progression Note    Patient Details  Name: Jesus Lam MRN: [id number] Date of Birth: 05-Dec-1932  Transition of Care [facility]) CM/SW Contact  Lauraine FORBES Saa, LCSWA Phone Number: 09/14/2024, 2:36 PM  Clinical Narrative:     2:36 PM Per chart review, patient is not yet medically ready for discharge to [facility]. CSW to submit SNF insurance authorization request as patient becomes more medically ready to discharge. CSW will continue to follow.   Expected Discharge Plan: Skilled Nursing Facility Barriers to Discharge: Continued Medical Work up, English as a second language teacher               Expected Discharge Plan and Services In-house Referral: Clinical Social Work Discharge Planning Services: CM Consult Post Acute Care Choice: IP Rehab, Skilled Nursing Facility Living arrangements for the past 2 months: Apartment                                       Social Drivers of Health (SDOH) Interventions SDOH Screenings   Food Insecurity: No Food Insecurity (09/07/2024)  Housing: Low Risk  (09/07/2024)  Transportation Needs: No Transportation Needs (09/07/2024)  Utilities: Not At Risk (09/07/2024)  Alcohol Screen: Low Risk  (06/16/2024)  Depression (PHQ2-9): Low Risk  (06/16/2024)  Financial Resource Strain: Low Risk  (06/16/2024)  Physical Activity: Inactive (06/16/2024)  Social Connections: Patient Declined (09/07/2024)  Recent Concern: Social Connections - Socially Isolated (06/16/2024)  Stress: No Stress Concern Present (06/16/2024)  Tobacco Use: Medium Risk (09/07/2024)  Health Literacy: Adequate Health Literacy (06/16/2024)    Readmission Risk Interventions     No data to display

## 2024-09-14 NOTE — Progress Notes (Signed)
 Occupational Therapy Treatment Patient Details Name: Jesus Lam MRN: [id number] DOB: 08/03/1933 Today's Date: 09/14/2024   History of present illness Pt is an 89 y/o male admitted due to acute CHF exacerbation. PMH: atrial flutter, CHF, CVA, hyperthyroidism, HTN, BPH   OT comments  Pt making good progress towards goals w/ reported desire to continue working on mobility today. Pt requiring light Min A to stand, but once up, able to mobilize in hallway using RW with CGA. Pt able to demonstrate simulated LB ADLs with Mod A though declined full ADL retraining today. Pt open to postacute rehab stay < 3 hours of therapy per day but hopeful to continue progressing acutely in order to return home with [facility] aide support.   SpO2 90% on RA, 2/4 DOE      If plan is discharge home, recommend the following:  A little help with walking and/or transfers;A lot of help with bathing/dressing/bathroom;Assistance with cooking/housework;Assist for transportation   Equipment Recommendations  None recommended by OT    Recommendations for Other Services      Precautions / Restrictions Precautions Precautions: Fall Recall of Precautions/Restrictions: Intact Restrictions Weight Bearing Restrictions Per Provider Order: No       Mobility Bed Mobility               General bed mobility comments: in recliner on entry    Transfers Overall transfer level: Needs assistance Equipment used: Rolling walker (2 wheels) Transfers: Sit to/from Stand Sit to Stand: Min assist           General transfer comment: Min A to stand from recliner nearing CGA. good use of UE to push self up     Balance Overall balance assessment: Needs assistance Sitting-balance support: Feet supported Sitting balance-Leahy Scale: Fair     Standing balance support: Bilateral upper extremity supported Standing balance-Leahy Scale: Poor                             ADL either performed or assessed with  clinical judgement   ADL Overall ADL's : Needs assistance/impaired             Lower Body Bathing: Moderate assistance;Sitting/lateral leans;Sit to/from stand Lower Body Bathing Details (indicate cue type and reason): pt declined full bathing but able to apply lotion to B upper LE, just below knee and required OT assist to apply lotion to remainder of leg                     Functional mobility during ADLs: Contact guard assist;Rolling walker (2 wheels) General ADL Comments: Pt reporting desire to work on walking more today, declined bathroom mobility or ADL retraining at sink when offered. Pt able to walk 98ft x 2 using RW with CGA and improved pain/stability though 2/4 DOE noted    Extremity/Trunk Assessment Upper Extremity Assessment Upper Extremity Assessment: Overall WFL for tasks assessed;Right hand dominant   Lower Extremity Assessment Lower Extremity Assessment: Defer to PT evaluation        Vision   Vision Assessment?: No apparent visual deficits   Perception     Praxis     Communication Communication Communication: Impaired Factors Affecting Communication: Reduced clarity of speech;Hearing impaired   Cognition Arousal: Alert Behavior During Therapy: WFL for tasks assessed/performed Cognition: No apparent impairments  Following commands: Impaired Following commands impaired: Follows one step commands with increased time, Follows multi-step commands with increased time      Cueing   Cueing Techniques: Verbal cues, Gestural cues  Exercises      Shoulder Instructions       General Comments SpO2 90% on RA, 2/4 DOE    Pertinent Vitals/ Pain       Pain Assessment Pain Assessment: Faces Faces Pain Scale: Hurts a little bit Pain Location: L knee>R Pain Descriptors / Indicators: Grimacing Pain Intervention(s): Monitored during session  Home Living                                           Prior Functioning/Environment              Frequency  Min 2X/week        Progress Toward Goals  OT Goals(current goals can now be found in the care plan section)  Progress towards OT goals: Progressing toward goals  Acute Rehab OT Goals Patient Stated Goal: be able to go directly home if i can OT Goal Formulation: With patient/family Time For Goal Achievement: 09/23/24 Potential to Achieve Goals: Good ADL Goals Pt Will Perform Grooming: with contact guard assist;standing Pt Will Transfer to Toilet: with min assist;ambulating Pt Will Perform Toileting - Clothing Manipulation and hygiene: with min assist;sitting/lateral leans;sit to/from stand Pt/caregiver will Perform Home Exercise Program: Increased strength;Both right and left upper extremity;With theraband;Independently;With written HEP provided  Plan      Co-evaluation                 AM-PAC OT 6 Clicks Daily Activity     Outcome Measure   Help from another person eating meals?: None Help from another person taking care of personal grooming?: A Little Help from another person toileting, which includes using toliet, bedpan, or urinal?: A Lot Help from another person bathing (including washing, rinsing, drying)?: A Lot Help from another person to put on and taking off regular upper body clothing?: A Little Help from another person to put on and taking off regular lower body clothing?: A Lot 6 Click Score: 16    End of Session Equipment Utilized During Treatment: Gait belt;Rolling walker (2 wheels)  OT Visit Diagnosis: Other abnormalities of gait and mobility (R26.89);Unsteadiness on feet (R26.81);Muscle weakness (generalized) (M62.81)   Activity Tolerance Patient tolerated treatment well   Patient Left in chair;with call bell/phone within reach   Nurse Communication          Time: 9157-9140 OT Time Calculation (min): 17 min  Charges: OT General Charges $OT Visit: 1 Visit OT  Treatments $Therapeutic Activity: 8-22 mins  Mliss NOVAK, OTR/L Acute Rehab Services Office: [phone]   Mliss Fish 09/14/2024, 9:51 AM

## 2024-09-14 NOTE — Progress Notes (Addendum)
 Progress Note  Patient Name: Jesus Lam Date of Encounter: 09/14/2024 [facility] Cardiologist: Newman JINNY Lawrence, MD   Interval Summary   Continues to have improvement in shortness of breath.  Creatinine is improving as well.  Diuresing well on current regiment.  Heart rates have remained controlled.  Vital Signs Vitals:   09/13/24 1934 09/14/24 0409 09/14/24 0836 09/14/24 0956  BP: 127/83 (!) 143/97 (!) 112/49 128/74  Pulse: (!) 108 (!) 105 78   Resp: 17 20 20    Temp: 98 F (36.7 C) 98.2 F (36.8 C) 98.6 F (37 C)   TempSrc: Oral Oral Oral   SpO2: 99% 94% 96%   Weight:  108.1 kg    Height:        Intake/Output Summary (Last 24 hours) at 09/14/2024 1004 Last data filed at 09/14/2024 0411 Gross per 24 hour  Intake 580 ml  Output 1550 ml  Net -970 ml      09/14/2024    4:09 AM 09/13/2024    5:04 AM 09/12/2024    3:40 AM  Last 3 Weights  Weight (lbs) 238 lb 5.1 oz 243 lb 9.7 oz 252 lb 6.8 oz  Weight (kg) 108.1 kg 110.5 kg 114.5 kg      Telemetry/ECG  Atrial fibrillation heart rates between 80-100- Personally Reviewed  Physical Exam  GEN: No acute distress.   Neck: + JVD Cardiac: IRRR, no murmurs, rubs, or gallops.  Respiratory: Clear to auscultation bilaterally. GI: Distended abdomen MS: Mild edema  Patient Profile Patient with past medical history significant for chronic HFrEF, persistent atrial fibrillation, PAD with SFA stenting, DVT, recurrent stroke, aortic dilatation.  Patient admitted for CHF exacerbation going on for the past 4 days and weight gain.  Assessment & Plan   Chronic HFrEF NICM -12/2017 LHC mild nonobstructive disease -02/2024 EF 45 to 50% -07/2024 normal stress test Echocardiogram this admission shows further reduction in EF 30 to 35% with global hypokinesis and normal RV function with moderately severely dilated atria.  Mild MR.  However he does have recent reassuring stress testing recently.  Weight on admission  251.  Creatinine and weights have been improving with gentle diuresis.    He is progressing well and diuresing appropriately.  Will continue same plan today and titrate GDMT.  Will need to be here for another day at least to see how he tolerates GDMT.  Slightly volume up with some JVD today.  GDMT has been limited by hypotension during this admission.  Did not tolerate Entresto and spironolactone.  Beta-blocker held for pauses noted on heart monitor this year.   He was doing very well on Jardiance.  Reported UTI-like symptoms with rare bacteria on UA yesterday.  For now we will hold Jardiance and will try to reinstate outpatient. Starting low-dose losartan 25 mg.  We would like to try low-dose carvedilol 3.125 mg twice daily to help assist with rates.  May consider at discharge repeating monitor to see how he tolerates.   Stopping hydralazine  50 mg 3 times daily.   Persistent atrial fibrillation Heart monitor 05/2024 showed 100% burden with 3-second pauses.  Rate controlled currently right now with heart rates generally less than 110. Continue with Eliquis  5 mg twice daily, also has indications given VTE. He will follow-up with EP to discuss further management of atrial fibrillation.  Of note he does have moderate to severely dilated LA.  Presumed to be new diagnosis of 05/2024.  Dilated aorta 44 mm based off of echocardiogram here.  Continue to monitor annually outpatient.  CKD He had AKI with diuresis.  Creatinine typically seems to be around 1.3.  It has been trending down for multiple days now .  Hyperthyroidism May be contributing to flutter.  Had elevated TSH 7.5 on admission.  He is on methimazole  chronically.  He has upcoming appointments with EP 10/22 and his primary cardiologist on 10/29.  If stable tomorrow may be able to discharge and still make it to his 11:00 appointment with EP.  For questions or updates, please contact [facility] Please consult [email or web address] for  contact info under       Signed, Thom LITTIE Sluder, PA-C     Agree with note by Thom Sluder, PA-C.  Patient mated with volume overload and heart failure.  A 2D echo did show decline in his EF from 45 to 50% in April down to 30 to 35% for unclear reasons.  He does have a history of nonischemic cardiomyopathy.  He has persistent A-fib on Eliquis  as well as a dilated or aorta and CKD with a creatinine that rose to as high as 1.7 and slowly come down to the 1.3 range which is his baseline.  He has diuresed 9 L on Jardiance but this was discontinued because of UTI symptoms.  He feels clinically improved.  His peripheral edema has resolved.  Still has JVD.  Recommend starting low-dose Lasix , low-dose losartan and low-dose carvedilol.  Monitor for complications of his new medications over the next 24 hours.  Otherwise, if he tolerates the medications and probably okay for discharge tomorrow with TOC heart failure follow-up after which she can see his outpatient primary cardiologist Dr. Elmira.  Dorn DOROTHA Lesches, M.D., FACP, [facility], FAHA, [facility]  [address], [address] [place], [place]  [address]  [phone] 09/14/2024 1:13 PM

## 2024-09-14 NOTE — Plan of Care (Signed)

## 2024-09-14 NOTE — Progress Notes (Signed)
 Progress Note   Patient: Jesus Lam FMW:[id number] DOB: 1933-04-08 DOA: 09/06/2024     8 DOS: the patient was seen and examined on 09/14/2024   Brief hospital course:  89 y.o. male with history of chronic HFrEF last EF measured was on August 23, 2024 when patient had myocardial perfusion scan which was negative for ischemia or infarct but did show EF of less than 35% with history of recurrent stroke prior history of DVT hypertension and recent diagnosis of atrial flutter presents to the ER with complaints of increasing shortness of breath and peripheral edema worsening over the last 2 weeks.  Denies any chest pain productive cough fever chills.  Patient states he has been compliant with his Lasix  20 mg daily.  He also has been more cautious about his diet recently.   ED Course: In the ER patient's EKG was showing atrial flutter controlled rate.  Chest x-ray was unremarkable.  proBNP was 846.  On exam patient has significant edema was given 20 mg IV Lasix  and admitted for further workup.  On exam patient has at least 3+ edema on both lower extremities and patient also feels his abdomen is getting distended.  He is on 2 L oxygen.  Since getting 20 mg IV Lasix  patient has put at least 500 cc out.  Admitted for CHF exacerbation.  Assessment and Plan: Acute on chronic systolic CHF (congestive heart failure), NYHA class 3 (HCC) Due to combination of excessive fluid intake and excessive salt intake.  Now clinically improved following inpt diuresis  -plan today to restart low-dose lasix , low-dose losartan and low-dose coreg -Jardiance now stopped given below UTI     Acute respiratory failure with hypoxia (HCC) -cont to wean O2 as tolerated     Obesity, Class I, BMI 30-34.9 Body mass index is 34.09 kg/m.     Typical atrial flutter (HCC) -Remains self-rate controlled, referral to EP as outpt noted -cont anticoag     CKD stage 3a, GFR 45-59 ml/min (HCC) - baseline Scr 1.1-1.3 09/10/24  baseline scr 1.1-1.3.  Cr peaked to 1.83, now trending down near baseline Cont low dose lasix  and low dose ARB per Cardiology Recheck bmet in AM   Hyperthyroidism TSH elevated 7.531 Takes tapazole  10 mg daily.  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     Anemia Iron low 32, sat ratio 9% Hgb trends stable --Monitor CBC   Essential hypertension 10/17 /25 BP's controlled, soft at times. Continue hydralazine . Holding amlodipine , aldactone.   -resumed low dose lasix , low dose coreg, low dose arb  Symptomatic UTI  - Pt with new dysuria   -UA is suggestive of UTI  -Urine cx thus far pos for enterococcus and ecoli, pending sensitivities  -cont on empiric rocephin    Subjective: Still having pain/burning on urination  Physical Exam: Vitals:   09/14/24 0836 09/14/24 0956 09/14/24 1207 09/14/24 1228  BP: (!) 112/49 128/74  124/76  Pulse: 78  98 77  Resp: 20  18   Temp: 98.6 F (37 C)  98.7 F (37.1 C)   TempSrc: Oral  Oral   SpO2: 96%  95%   Weight:      Height:       General exam: Conversant, in no acute distress Respiratory system: normal chest rise, clear, no audible wheezing Cardiovascular system: regular rhythm, s1-s2 Gastrointestinal system: Nondistended, nontender, pos BS Central nervous system: No seizures, no tremors Extremities: No cyanosis, no joint  deformities Skin: No rashes, no pallor Psychiatry: Affect normal // no auditory hallucinations   Data Reviewed:  Labs reviewed: Na 136, K 4.5, Cr 1.39, WBC 4.1, hgb 11.2, Plts 229  Family Communication: Pt in room, fmaily at bedside  Disposition: Status is: Inpatient Remains inpatient appropriate because: severity of illness  Planned Discharge Destination: Skilled nursing facility    Author: Garnette Pelt, MD 09/14/2024 2:10 PM  For on call review [email or web address].

## 2024-09-15 ENCOUNTER — Ambulatory Visit: Admitting: Cardiovascular Disease

## 2024-09-15 ENCOUNTER — Other Ambulatory Visit: Payer: Self-pay | Admitting: Cardiology

## 2024-09-15 DIAGNOSIS — I5023 Acute on chronic systolic (congestive) heart failure: Secondary | ICD-10-CM | POA: Diagnosis not present

## 2024-09-15 LAB — BASIC METABOLIC PANEL WITH GFR
Anion gap: 9 (ref 5–15)
BUN: 32 mg/dL — ABNORMAL HIGH (ref 8–23)
CO2: 23 mmol/L (ref 22–32)
Calcium: 9.1 mg/dL (ref 8.9–10.3)
Chloride: 106 mmol/L (ref 98–111)
Creatinine, Ser: 1.4 mg/dL — ABNORMAL HIGH (ref 0.61–1.24)
GFR, Estimated: 48 mL/min — ABNORMAL LOW (ref >60)
Glucose, Bld: 109 mg/dL — ABNORMAL HIGH (ref 70–99)
Potassium: 4.7 mmol/L (ref 3.5–5.1)
Sodium: 138 mmol/L (ref 135–145)

## 2024-09-15 LAB — CBC
HCT: 38 % — ABNORMAL LOW (ref 39.0–52.0)
Hemoglobin: 11.7 g/dL — ABNORMAL LOW (ref 13.0–17.0)
MCH: 28.3 pg (ref 26.0–34.0)
MCHC: 30.8 g/dL (ref 30.0–36.0)
MCV: 92 fL (ref 80.0–100.0)
Platelets: 218 K/uL (ref 150–400)
RBC: 4.13 MIL/uL — ABNORMAL LOW (ref 4.22–5.81)
RDW: 14.3 % (ref 11.5–15.5)
WBC: 4.2 K/uL (ref 4.0–10.5)
nRBC: 0 % (ref 0.0–0.2)

## 2024-09-15 MED ORDER — FOSFOMYCIN TROMETHAMINE 3 G PO PACK
3.0000 g | PACK | Freq: Once | ORAL | Status: AC
  Administered 2024-09-15: 3 g via ORAL
  Filled 2024-09-15: qty 3

## 2024-09-15 MED ORDER — FUROSEMIDE 10 MG/ML IJ SOLN
80.0000 mg | Freq: Once | INTRAMUSCULAR | Status: AC
  Administered 2024-09-15: 80 mg via INTRAVENOUS
  Filled 2024-09-15: qty 8

## 2024-09-15 NOTE — Progress Notes (Signed)
 Progress Note  Patient Name: Jesus Lam Date of Encounter: 09/15/2024 [facility] Cardiologist: Newman JINNY Lawrence, MD   Interval Summary   PT says hard to say how breathing is, not sure if at baseline   No CP  Vital Signs Vitals:   09/14/24 1748 09/14/24 1934 09/14/24 2254 09/15/24 0629  BP: 116/87 114/62 125/82 (!) 130/59  Pulse: 100 70 79 96  Resp: 17 18 18 19   Temp:  98 F (36.7 C) (!) 97.5 F (36.4 C) 98.3 F (36.8 C)  TempSrc:  Oral Axillary Oral  SpO2: 94% 95% 96% 93%  Weight:      Height:        Intake/Output Summary (Last 24 hours) at 09/15/2024 0654 Last data filed at 09/15/2024 9364 Gross per 24 hour  Intake 120 ml  Output 1550 ml  Net -1430 ml    ? Complete I/O  10.6 L      09/14/2024    4:09 AM 09/13/2024    5:04 AM 09/12/2024    3:40 AM  Last 3 Weights  Weight (lbs) 238 lb 5.1 oz 243 lb 9.7 oz 252 lb 6.8 oz  Weight (kg) 108.1 kg 110.5 kg 114.5 kg      Telemetry/ECG  Atrial fibrillation  60s to 90s  Personally Reviewed  Physical Exam  GEN: No acute distress.   Neck: Neck is full Cardiac: IRRR, no murmurs, rubs, or gallops.  Respiratory:  Rales bilateral bases . GI: Distended abdomen  Nontender  MS: 1+ pitting edema  Patient Profile Patient with past medical history significant for chronic HFrEF, persistent atrial fibrillation, PAD with SFA stenting, DVT, recurrent stroke, aortic dilatation.  Patient admitted for CHF exacerbation going on for the past 4 days and weight gain.  Assessment & Plan   Chronic HFrEF Felt to be Nonischemic  -12/2017 LHC mild nonobstructive disease -02/2024  Echo   LVEF  45 to 50% -07/2024 Normal perfusion    LVEF 33%  Echocardiogram this admission LVEF 30 to 35% with global hypokinesis and normal RV function with moderately severely dilated atria.  Mild MR.  However he does have recent reassuring stress testing recently.  Weight on admission 251.  Creatinine and weights have been improving with  gentle diuresis.    Pt has diuresed since admit though ? If I/O total accurate  Still with volume increase on exam   BP has limited GDMT  Did not tolerate ENtresto or spironolactone B BLocker held for pauses in past     Jardiance lead to UTI symptoms ?Reported UTI-like symptoms with rare bacteria on UA yesterday.  For now we will hold Jardiance and will try to reinstate outpatient. PT started on losartan and carvedilol  Both low dose   Note b blocker had been held (bisoprolol  Pt had been on hydralazine  50 tid   THis was stopped    QUestion why LVEF dropped    B BLocker stopped in April 2025 due to lower HR and pauses in 3-3.5 second range (observed while hospitalized for CVA; not clear if sleeping at time)  But Monitor in [place] showed average HR 69    No pauses (on my review)  REcomm  Still with some volume increase on exam (rales, edema)   I would give IV lasix  x 1   Follow response   WIth no CP and recent myoview showing normal perfusion, I would continue current meds and follow LVEF over time (echo in 3 months)   Persistent atrial fibrillation Heart  monitor 05/2024 showed 100% burden  HR 69   average.  Here in hosp HR OK on carvedilol   I would not push further  Keep on Eliquis  5 bid   WIll need to follow anticoag and renal function closely Note some concern for noncompliance in past (APril, at time of CVA)   Dilated aorta 44 mm based off of echocardiogram here.  Given age, ques follow up  Control BP   CKD Cr 1.83 on 10/17  No 1.4   I still would give Lasix  x 1    Hyperthyroidism PT on methimazole    TSH was 7.5 but T4 0.72     For questions or updates, please contact [facility] Please consult [email or web address] for contact info under       Signed, Vina Gull, MD

## 2024-09-15 NOTE — Progress Notes (Signed)
 Physical Therapy Treatment Patient Details Name: Jesus Lam MRN: [id number] DOB: 1933-10-11 Today's Date: 09/15/2024   History of Present Illness Pt is an 89 y/o male admitted due to acute CHF exacerbation. PMH: atrial flutter, CHF, CVA, hyperthyroidism, HTN, BPH    PT Comments  Pt admitted with above diagnosis. Pt continues to struggle with gait needing mod cues and min assist for safety. Pt limited by poor endurance and questionable desaturation to 86% needing 3LO2 today to keep sats >90%.  This PT feels that pt is still appropriate for post acute rehab < 3hours day as this will give him time to get stronger and improve with endurance and strength.  Will continue to follow acutely.  Pt currently with functional limitations due to the deficits listed below (see PT Problem List). Pt will benefit from acute skilled PT to increase their independence and safety with mobility to allow discharge.       If plan is discharge home, recommend the following: A lot of help with walking and/or transfers;A little help with walking and/or transfers;A little help with bathing/dressing/bathroom;Assistance with cooking/housework;Assist for transportation;Supervision due to cognitive status   Can travel by private vehicle     Yes  Equipment Recommendations  None recommended by PT    Recommendations for Other Services       Precautions / Restrictions Precautions Precautions: Fall Recall of Precautions/Restrictions: Intact Restrictions Weight Bearing Restrictions Per Provider Order: No     Mobility  Bed Mobility               General bed mobility comments: in recliner on entry    Transfers Overall transfer level: Needs assistance Equipment used: Rolling walker (2 wheels) Transfers: Sit to/from Stand Sit to Stand: Min assist           General transfer comment: Min A to stand from recliner nearing CGA. good use of UE to push self up. Takes incr time and needed cues with slight  assist to power up.    Ambulation/Gait Ambulation/Gait assistance: Min assist Gait Distance (Feet): 150 Feet Assistive device: Rolling walker (2 wheels) Gait Pattern/deviations: Step-through pattern, Decreased stride length, Trunk flexed, Antalgic Gait velocity: dec Gait velocity interpretation: <1.31 ft/sec, indicative of household ambulator   General Gait Details: Fairly antalgic pattern, requires cues for upright posture and intermittent min assist for RW control and stabiilty. Educated on gait symmetry and step length. SpO2 dropping to 86% on RA with ambulation therefore placedon 3LO2 with pt sats 90% with O2.  Did replace pulse ox cable at end of walk as unsure if it was reading accurately. Slightly impulsive initially however pt slowed down toward end of walk and posture worsened as he continued as well with notable change in endurance for activity.   Stairs             Wheelchair Mobility     Tilt Bed    Modified Rankin (Stroke Patients Only)       Balance Overall balance assessment: Needs assistance Sitting-balance support: Feet supported, No upper extremity supported Sitting balance-Leahy Scale: Fair     Standing balance support: Bilateral upper extremity supported Standing balance-Leahy Scale: Poor Standing balance comment: BIL UEs for support.                            Communication Communication Communication: Impaired Factors Affecting Communication: Reduced clarity of speech;Hearing impaired;Difficulty expressing self  Cognition Arousal: Alert Behavior During Therapy: [facility] for tasks assessed/performed  PT - Cognitive impairments: Awareness, Memory, Problem solving, Safety/Judgement                         Following commands: Impaired Following commands impaired: Follows one step commands with increased time, Follows multi-step commands with increased time    Cueing Cueing Techniques: Verbal cues, Gestural cues  Exercises  General Exercises - Lower Extremity Ankle Circles/Pumps: AROM, Both, 15 reps, Seated Long Arc Quad: Strengthening, Both, 15 reps, Seated    General Comments        Pertinent Vitals/Pain Pain Assessment Pain Assessment: Faces Faces Pain Scale: Hurts little more Breathing: normal Pain Location: L knee>R Pain Descriptors / Indicators: Grimacing Pain Intervention(s): Limited activity within patient's tolerance, Monitored during session, Repositioned    Home Living                          Prior Function            PT Goals (current goals can now be found in the care plan section) Progress towards PT goals: Progressing toward goals    Frequency    Min 2X/week      PT Plan      Co-evaluation              AM-PAC PT 6 Clicks Mobility   Outcome Measure  Help needed turning from your back to your side while in a flat bed without using bedrails?: A Lot Help needed moving from lying on your back to sitting on the side of a flat bed without using bedrails?: A Lot Help needed moving to and from a bed to a chair (including a wheelchair)?: A Lot Help needed standing up from a chair using your arms (e.g., wheelchair or bedside chair)?: A Little Help needed to walk in hospital room?: A Little Help needed climbing 3-5 steps with a railing? : Total 6 Click Score: 13    End of Session Equipment Utilized During Treatment: Gait belt;Oxygen Activity Tolerance: Patient limited by fatigue Patient left: with call bell/phone within reach;in chair Nurse Communication: Mobility status PT Visit Diagnosis: Muscle weakness (generalized) (M62.81);Other abnormalities of gait and mobility (R26.89);Difficulty in walking, not elsewhere classified (R26.2);Pain Pain - Right/Left: Left Pain - part of body: Knee     Time: 8955-8892 PT Time Calculation (min) (ACUTE ONLY): 23 min  Charges:    $Gait Training: 8-22 mins $Therapeutic Exercise: 8-22 mins PT General Charges $$  ACUTE PT VISIT: 1 Visit                     Jesus Lam M,PT Acute Rehab Services [phone]    Jesus Lam 09/15/2024, 12:26 PM

## 2024-09-15 NOTE — Progress Notes (Signed)
 Mobility Specialist Progress Note;   09/15/24 0915  Mobility  Activity Pivoted/transferred to/from [facility]  Level of Assistance Minimal assist, patient does 75% or more  Assistive Device None;BSC  Distance Ambulated (ft) 3 ft  Activity Response Tolerated well  Mobility Referral Yes  Mobility visit 1 Mobility  Mobility Specialist Start Time (ACUTE ONLY) 0915  Mobility Specialist Stop Time (ACUTE ONLY) 0920  Mobility Specialist Time Calculation (min) (ACUTE ONLY) 5 min   Answered pts call light requesting assistance from [facility]. BM successful, performed pericare on pt. Required MinA to stand from [facility] and pivot back to chair w/o fault. On RA throughout however unable to receive SPO2 pleth throughout movement. Pt left in chair with all needs met.   Lauraine Erm Mobility Specialist Please contact via SecureChat or [employer] [phone]

## 2024-09-15 NOTE — Plan of Care (Signed)

## 2024-09-15 NOTE — Progress Notes (Addendum)
 PROGRESS NOTE    Jesus Lam  FMW:[id number] DOB: 03/21/1933 DOA: 09/06/2024 PCP: Georgina Speaks, FNP   Brief Narrative:  This 89 yrs old Male with PMH significant for chronic HFrEF( last EF measured was on August 23, 2024 when patient had myocardial perfusion scan which was negative for ischemia or infarct but did show EF of less than 35% ) history of recurrent stroke, prior history of DVT,  hypertension and recent diagnosis of atrial flutter presents to the ED with complaints of increasing shortness of breath and peripheral edema worsening over the last 2 weeks.  Denies any chest pain,  productive cough,  fever or chills.  Patient states he has been compliant with his Lasix  20 mg daily.  He also has been more cautious about his diet recently.  In ED: EKG showing atrial flutter, controlled rate. Chest x-ray was unremarkable. proBNP was 846.  On exam patient has significant edema, he was given Lasix  IV and admitted for further evaluation.  Patient was admitted for acute on chronic systolic CHF.  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  Now clinically improved following inpatient diuresis. Restarted on low-dose lasix , low-dose losartan and low-dose coreg Jardiance now stopped given below UTI. Continue supplemental oxygen and wean as tolerated. Cardiology is following.  Patient is given Lasix  80 mg IV once today due to slight volume  load   Intake/Output Summary (Last 24 hours) at 09/15/2024 1100 Last data filed at 09/15/2024 0747 Gross per 24 hour  Intake 240 ml  Output 1550 ml  Net -1310 ml      Atrial flutter (HCC): Heart rate well-controlled, referral to EP as outpt noted. Continue anticoagulation with Eliquis .   CKD stage 3a, GFR 45-59 ml/min (HCC) - baseline Scr 1.1-1.3 Baseline creatinine remains around 1.1-1.3 Creatinine peaked to 1.83, now trending down near baseline. Continue low dose lasix  and low dose ARB per Cardiology Recheck bmet in AM.   Hyperthyroidism: Continue tapazole  10 mg daily.   Dysarthria as late effect of cerebellar cerebrovascular accident (CVA) Chronic.  Stable, PT and OT evaluation.   Normochromic , Normocytic Anemia Iron low 32, sat ratio 9% Hgb trends stable. Monitor CBC   Essential hypertension: BP's are controlled, soft at times.  Continue hydralazine . Holding amlodipine , aldactone.   Resumed low dose lasix , low dose coreg, low dose arb.   Symptomatic UTI: Patient presented with dysuria, UA suggestive of UTI Urine cx thus far pos for enterococcus and ecoli, pending sensitivities Patient continued on empiric ceftriaxone .  Obesity, Class I, BMI 30-34.9 Body mass index is 34.09 kg/m. Diet and exercise discussed in detail.    DVT prophylaxis: Eliquis   Code Status: Full code Family Communication: No family at bed side Disposition Plan:    Status is: Inpatient Remains inpatient appropriate because: Severity of illness.    Consultants:  Cardiology  Procedures:  Antimicrobials: Anti-infectives (From admission, onward)    Start     Dose/Rate Route  Frequency Ordered Stop   09/15/24 1115  fosfomycin (MONUROL) packet 3 g        3 g Oral  Once 09/15/24 1027     09/13/24 1215  cefTRIAXone  (ROCEPHIN ) 1 g in sodium chloride  0.9 % 100 mL IVPB  Status:  Discontinued        1 g 200 mL/hr over 30 Minutes Intravenous Every 24 hours 09/13/24 1120 09/15/24 1027        Subjective: Patient was seen and examined at bedside.  Overnight events noted. Patient was sitting comfortably on the recliner, has mild edema in the legs. Patient reports feeling improved.  Objective: Vitals:   09/14/24 2254 09/15/24 0629 09/15/24 0743 09/15/24 0747  BP: 125/82 (!) 130/59  (!) 98/57  Pulse: 79 96  76  Resp: 18 19  18   Temp: (!) 97.5 F (36.4 C) 98.3 F (36.8 C)  98.1 F (36.7 C)  TempSrc: Axillary Oral  Oral  SpO2: 96% 93%  96%  Weight:   108.1 kg   Height:        Intake/Output Summary (Last 24 hours) at 09/15/2024 1100 Last data filed at 09/15/2024 0747 Gross per 24 hour  Intake 240 ml  Output 1550 ml  Net -1310 ml   Filed Weights   09/13/24 0504 09/14/24 0409 09/15/24 0743  Weight: 110.5 kg 108.1 kg 108.1 kg    Examination:  General exam: Appears calm and comfortable, not in any acute distress. Respiratory system: Bibasilar rales, Respiratory effort normal. RR 16 Cardiovascular system: S1 & S2 heard, RRR. No JVD, murmurs, rubs, gallops or clicks.  Gastrointestinal system: Abdomen is non distended, soft and non tender.  Normal bowel sounds heard. Central nervous system: Alert and oriented x 3. No focal neurological deficits. Extremities: No edema+, no cyanosis, no clubbing. Skin: No rashes, lesions or ulcers Psychiatry: Judgement and insight appear normal. Mood & affect appropriate.    Data Reviewed: I have personally reviewed following labs and imaging studies  CBC: Recent Labs  Lab 09/12/24 0219 09/13/24 0411 09/14/24 0754 09/15/24 0242  WBC 5.5 4.5 4.1 4.2  HGB 11.3* 10.9* 11.2* 11.7*  HCT 36.7* 35.4* 36.1* 38.0*  MCV 93.1 93.2 91.2 92.0  PLT 206 216 229 218   Basic Metabolic Panel: Recent Labs  Lab 09/09/24 0250 09/10/24 0322 09/11/24 0212 09/12/24 0219 09/13/24 0411 09/14/24 0512 09/15/24 0242  NA 139   < > 137 138 138 136 138  K 4.3   < > 4.5 4.8 4.5 4.5 4.7  CL 103   < > 100 103 105 103 106  CO2 25   < > 26 25 24  25 23   GLUCOSE 102*   < > 107* 106* 99 111* 109*  BUN 33*   < > 47* 51* 41* 31* 32*  CREATININE 1.72*   < > 1.78* 1.70* 1.47* 1.39* 1.40*  CALCIUM  8.5*   < > 8.7* 8.7* 8.9 9.3 9.1  MG 2.3  --   --  2.3  --   --   --    < > = values in this interval not displayed.   GFR: Estimated Creatinine Clearance: 45.4 mL/min (A) (by C-G formula based on SCr of 1.4 mg/dL (H)). Liver Function Tests: Recent Labs  Lab 09/12/24 0219 09/13/24 0411  AST 26 21  ALT 21 20  ALKPHOS 58 50  BILITOT 0.7 0.7  PROT 7.3 7.4  ALBUMIN 2.7* 2.7*   No results for input(s): LIPASE, AMYLASE in the last 168 hours.  No results for input(s): AMMONIA in the last 168 hours. Coagulation Profile: No results for input(s): INR, PROTIME in the last 168 hours. Cardiac Enzymes: No results for input(s): CKTOTAL, CKMB, CKMBINDEX, TROPONINI in the last 168 hours. BNP (last 3 results) Recent Labs    09/17/23 1504 09/06/24 1414  PROBNP 213 846.0*   HbA1C: No results for input(s): HGBA1C in the last 72 hours. CBG: No results for input(s): GLUCAP in the last 168 hours. Lipid Profile: No results for input(s): CHOL, HDL, LDLCALC, TRIG, CHOLHDL, LDLDIRECT in the last 72 hours. Thyroid  Function Tests: No results for input(s): TSH, T4TOTAL, FREET4, T3FREE, THYROIDAB in the last 72 hours. Anemia Panel: No results for input(s): VITAMINB12, FOLATE, FERRITIN, TIBC, IRON, RETICCTPCT in the last 72 hours. Sepsis Labs: No results for input(s): PROCALCITON, LATICACIDVEN in the last 168 hours.  Recent Results (from the past 240 hours)  Resp panel by RT-PCR (RSV, Flu A&B, Covid) Anterior Nasal Swab     Status: None   Collection Time: 09/06/24  2:14 PM   Specimen: Anterior Nasal Swab  Result Value Ref Range Status   SARS Coronavirus 2 by RT PCR NEGATIVE NEGATIVE Final    Comment: (NOTE) SARS-CoV-2 target nucleic acids are NOT DETECTED.  The SARS-CoV-2 RNA is generally  detectable in upper respiratory specimens during the acute phase of infection. The lowest concentration of SARS-CoV-2 viral copies this assay can detect is 138 copies/mL. A negative result does not preclude SARS-Cov-2 infection and should not be used as the sole basis for treatment or other patient management decisions. A negative result may occur with  improper specimen collection/handling, submission of specimen other than nasopharyngeal swab, presence of viral mutation(s) within the areas targeted by this assay, and inadequate number of viral copies(<138 copies/mL). A negative result must be combined with clinical observations, patient history, and epidemiological information. The expected result is Negative.  Fact Sheet for Patients:  BloggerCourse.com  Fact Sheet for Healthcare Providers:  SeriousBroker.it  This test is no t yet approved or cleared by the [place]  FDA and  has been authorized for detection and/or diagnosis of SARS-CoV-2 by FDA under an Emergency Use Authorization (EUA). This EUA will remain  in effect (meaning this test can be used) for the duration of the COVID-19 declaration under Section 564(b)(1) of the Act, 21 U.S.C.section 360bbb-3(b)(1), unless the authorization is terminated  or revoked sooner.       Influenza A by PCR NEGATIVE NEGATIVE Final   Influenza B by PCR NEGATIVE NEGATIVE Final    Comment: (NOTE) The Xpert Xpress SARS-CoV-2/FLU/RSV plus assay is intended as an aid in the diagnosis of influenza from Nasopharyngeal swab specimens and should not be used as a sole basis for treatment. Nasal washings and aspirates are unacceptable for Xpert Xpress SARS-CoV-2/FLU/RSV testing.  Fact Sheet for Patients: BloggerCourse.com  Fact Sheet for Healthcare Providers: SeriousBroker.it  This test is not yet approved or cleared by the [place]  FDA  and has been authorized for detection and/or diagnosis of SARS-CoV-2 by FDA under an Emergency Use Authorization (EUA). This EUA will remain in effect (meaning this test can be used) for the duration of the COVID-19 declaration under Section 564(b)(1) of the Act, 21 U.S.C. section 360bbb-3(b)(1), unless the authorization is terminated or revoked.     Resp Syncytial Virus by PCR NEGATIVE NEGATIVE Final    Comment: (NOTE) Fact Sheet for Patients: BloggerCourse.com  Fact Sheet for Healthcare Providers: SeriousBroker.it  This test is not yet approved or cleared by the  [place]  FDA and has been authorized for detection and/or diagnosis of SARS-CoV-2 by FDA under an Emergency Use Authorization (EUA). This EUA will remain in effect (meaning this test can be used) for the duration of the COVID-19 declaration under Section 564(b)(1) of the Act, 21 U.S.C. section 360bbb-3(b)(1), unless the authorization is terminated or revoked.  Performed at [facility], [address] [place], [place] [address]   MRSA Next Gen by PCR, Nasal     Status: None   Collection Time: 09/06/24 10:05 PM   Specimen: Nasal Mucosa; Nasal Swab  Result Value Ref Range Status   MRSA by PCR Next Gen NOT DETECTED NOT DETECTED Final    Comment: (NOTE) The GeneXpert MRSA Assay (FDA approved for NASAL specimens only), is one component of a comprehensive MRSA colonization surveillance program. It is not intended to diagnose MRSA infection nor to guide or monitor treatment for MRSA infections. Test performance is not FDA approved in patients less than 85 years old. Performed at [facility], [address], [place], [place] [address]   Urine Culture (for pregnant, neutropenic or urologic patients or patients with an indwelling urinary catheter)     Status: Abnormal (Preliminary result)   Collection Time: 09/13/24 11:19 AM   Specimen:  Urine, Clean Catch  Result Value Ref Range Status   Specimen Description URINE, CLEAN CATCH  Final   Special Requests NONE  Final   Culture (A)  Final    >=100,000 COLONIES/mL ENTEROCOCCUS FAECALIS 40,000 COLONIES/mL ESCHERICHIA COLI SUSCEPTIBILITIES TO FOLLOW Performed at [facility], [address] [place], [place] [address]    Report Status PENDING  Incomplete   Radiology Studies: No results found.  Scheduled Meds:  acetaminophen   650 mg Oral TID   apixaban   5 mg Oral BID   atorvastatin   80 mg Oral Daily   carvedilol  3.125 mg Oral BID WC   diclofenac  Sodium  2 g Topical QID   fosfomycin  3 g Oral Once   losartan  25 mg Oral Daily   methimazole   10 mg Oral Daily   mirtazapine   7.5 mg Oral QHS   tamsulosin   0.8 mg Oral QPC supper   Continuous Infusions:   LOS: 9 days    Time spent: 50 mins    Darcel Dawley, MD [facility]   If 7PM-7AM, please contact night-coverage

## 2024-09-16 DIAGNOSIS — I5023 Acute on chronic systolic (congestive) heart failure: Secondary | ICD-10-CM | POA: Diagnosis not present

## 2024-09-16 LAB — URINE CULTURE: Culture: >=100000 — AB

## 2024-09-16 LAB — BASIC METABOLIC PANEL WITH GFR
Anion gap: 10 (ref 5–15)
BUN: 36 mg/dL — ABNORMAL HIGH (ref 8–23)
CO2: 24 mmol/L (ref 22–32)
Calcium: 9.2 mg/dL (ref 8.9–10.3)
Chloride: 105 mmol/L (ref 98–111)
Creatinine, Ser: 1.53 mg/dL — ABNORMAL HIGH (ref 0.61–1.24)
GFR, Estimated: 43 mL/min — ABNORMAL LOW (ref >60)
Glucose, Bld: 97 mg/dL (ref 70–99)
Potassium: 4.7 mmol/L (ref 3.5–5.1)
Sodium: 139 mmol/L (ref 135–145)

## 2024-09-16 MED ORDER — FUROSEMIDE 20 MG PO TABS
20.0000 mg | ORAL_TABLET | Freq: Every day | ORAL | Status: DC
  Administered 2024-09-16 – 2024-09-17 (×2): 20 mg via ORAL
  Filled 2024-09-16 (×2): qty 1

## 2024-09-16 MED ORDER — LEVOFLOXACIN 250 MG PO TABS
250.0000 mg | ORAL_TABLET | Freq: Every day | ORAL | Status: DC
Start: 2024-09-16 — End: 2024-09-17
  Administered 2024-09-16 – 2024-09-17 (×2): 250 mg via ORAL
  Filled 2024-09-16 (×2): qty 1

## 2024-09-16 NOTE — Progress Notes (Signed)
 Occupational Therapy Treatment Patient Details Name: Jesus Lam MRN: [id number] DOB: 29-May-1933 Today's Date: 09/16/2024   History of present illness Pt is an 89 y/o male admitted due to acute CHF exacerbation. PMH: atrial flutter, CHF, CVA, hyperthyroidism, HTN, BPH   OT comments  Pt in chair, requested to use BSC. Stood with increased time from recliner with CGA, ambulated to [facility] [place] and stood at sink for one grooming activity with CGA. Pt needed total assist for pericare for thoroughness. Min assist to change backside gown. VSS throughout session. Patient will benefit from continued inpatient follow up therapy, <3 hours/day.      If plan is discharge home, recommend the following:  A little help with walking and/or transfers;A lot of help with bathing/dressing/bathroom;Assistance with cooking/housework;Assist for transportation   Equipment Recommendations  None recommended by OT    Recommendations for Other Services      Precautions / Restrictions Precautions Precautions: Fall Recall of Precautions/Restrictions: Intact Restrictions Weight Bearing Restrictions Per Provider Order: No       Mobility Bed Mobility               General bed mobility comments: in recliner on entry    Transfers Overall transfer level: Needs assistance Equipment used: Rolling walker (2 wheels) Transfers: Sit to/from Stand Sit to Stand: Contact guard assist           General transfer comment: increased time, heavy reliance on UEs     Balance Overall balance assessment: Needs assistance   Sitting balance-Leahy Scale: Fair     Standing balance support: Bilateral upper extremity supported Standing balance-Leahy Scale: Poor Standing balance comment: walker dependent                           ADL either performed or assessed with clinical judgement   ADL Overall ADL's : Needs assistance/impaired     Grooming: Standing;Wash/dry hands           Upper Body  Dressing : Minimal assistance;Sitting Upper Body Dressing Details (indicate cue type and reason): front opening gown     Toilet Transfer: Ambulation;Rolling walker (2 wheels);BSC/3in1;Contact guard assist   Toileting- Clothing Manipulation and Hygiene: Total assistance;Sit to/from stand       Functional mobility during ADLs: Contact guard assist;Rolling walker (2 wheels)      Extremity/Trunk Assessment              Vision       Perception     Praxis     Communication Communication Communication: Impaired Factors Affecting Communication: Reduced clarity of speech;Hearing impaired;Difficulty expressing self   Cognition Arousal: Alert Behavior During Therapy: WFL for tasks assessed/performed Cognition: Difficult to assess Difficult to assess due to: Hard of hearing/deaf                             Following commands: Intact Following commands impaired: Only follows one step commands consistently      Cueing   Cueing Techniques: Verbal cues  Exercises      Shoulder Instructions       General Comments      Pertinent Vitals/ Pain       Pain Assessment Pain Assessment: Faces Faces Pain Scale: Hurts a little bit Pain Location: buttocks Pain Descriptors / Indicators: Discomfort Pain Intervention(s): Repositioned  Home Living  Prior Functioning/Environment              Frequency  Min 2X/week        Progress Toward Goals  OT Goals(current goals can now be found in the care plan section)  Progress towards OT goals: Progressing toward goals  Acute Rehab OT Goals OT Goal Formulation: With patient/family Time For Goal Achievement: 09/23/24 Potential to Achieve Goals: Good  Plan      Co-evaluation                 AM-PAC OT 6 Clicks Daily Activity     Outcome Measure   Help from another person eating meals?: None Help from another person taking care of personal  grooming?: A Little Help from another person toileting, which includes using toliet, bedpan, or urinal?: A Lot Help from another person bathing (including washing, rinsing, drying)?: A Lot Help from another person to put on and taking off regular upper body clothing?: A Little Help from another person to put on and taking off regular lower body clothing?: A Lot 6 Click Score: 16    End of Session    OT Visit Diagnosis: Other abnormalities of gait and mobility (R26.89);Unsteadiness on feet (R26.81);Muscle weakness (generalized) (M62.81)   Activity Tolerance Patient tolerated treatment well   Patient Left in chair;with call bell/phone within reach   Nurse Communication          Time: 8492-8474 OT Time Calculation (min): 18 min  Charges: OT General Charges $OT Visit: 1 Visit OT Treatments $Self Care/Home Management : 8-22 mins  Mliss HERO, OTR/L Acute Rehabilitation Services Office: [phone]   Kennth Mliss Helling 09/16/2024, 3:42 PM

## 2024-09-16 NOTE — TOC Progression Note (Signed)
 Transition of Care [facility]) - Progression Note    Patient Details  Name: Jesus Lam MRN: [id number] Date of Birth: 1933/08/03  Transition of Care [facility] CM/SW Contact  Lauraine FORBES Saa, LCSWA Phone Number: 09/16/2024, 1:20 PM  Clinical Narrative:     1:20 PM Per MD, patient is medically ready to discharge. Bedside RN and [facility] SNF made aware. CSW submitted SNF insurance authorization [id number]) which is currently pending. CSW will continue to follow.  Expected Discharge Plan: Skilled Nursing Facility Barriers to Discharge: Continued Medical Work up, English as a second language teacher               Expected Discharge Plan and Services In-house Referral: Clinical Social Work Discharge Planning Services: CM Consult Post Acute Care Choice: IP Rehab, Skilled Nursing Facility Living arrangements for the past 2 months: Apartment                                       Social Drivers of Health (SDOH) Interventions SDOH Screenings   Food Insecurity: No Food Insecurity (09/07/2024)  Housing: Low Risk  (09/07/2024)  Transportation Needs: No Transportation Needs (09/07/2024)  Utilities: Not At Risk (09/07/2024)  Alcohol Screen: Low Risk  (06/16/2024)  Depression (PHQ2-9): Low Risk  (06/16/2024)  Financial Resource Strain: Low Risk  (06/16/2024)  Physical Activity: Inactive (06/16/2024)  Social Connections: Patient Declined (09/07/2024)  Recent Concern: Social Connections - Socially Isolated (06/16/2024)  Stress: No Stress Concern Present (06/16/2024)  Tobacco Use: Medium Risk (09/07/2024)  Health Literacy: Adequate Health Literacy (06/16/2024)    Readmission Risk Interventions     No data to display

## 2024-09-16 NOTE — Progress Notes (Addendum)
 Progress Note  Patient Name: Jesus Lam Date of Encounter: 09/16/2024 [facility] Cardiologist: Newman JINNY Lawrence, MD   Interval Summary    Sitting up in the chair, c/o going dysuria. Otherwise doing ok  Vital Signs Vitals:   09/15/24 1953 09/15/24 2337 09/16/24 0514 09/16/24 0753  BP: 116/79 107/70 (!) 144/79 102/62  Pulse: 76 80 75 67  Resp: 14 17 16 19   Temp: 98.1 F (36.7 C) 98.3 F (36.8 C) 98 F (36.7 C) 98.2 F (36.8 C)  TempSrc: Oral Oral Oral Oral  SpO2: 98% 96% 93% 95%  Weight:      Height:        Intake/Output Summary (Last 24 hours) at 09/16/2024 0803 Last data filed at 09/16/2024 0801 Gross per 24 hour  Intake 120 ml  Output 2550 ml  Net -2430 ml      09/15/2024    7:43 AM 09/14/2024    4:09 AM 09/13/2024    5:04 AM  Last 3 Weights  Weight (lbs) 238 lb 5.1 oz 238 lb 5.1 oz 243 lb 9.7 oz  Weight (kg) 108.1 kg 108.1 kg 110.5 kg      Telemetry/ECG  Atrial flutter rates 60-70s - Personally Reviewed  Physical Exam  GEN: No acute distress.   Neck: No JVD Cardiac: Irreg, no murmurs, rubs, or gallops.  Respiratory: Clear to auscultation bilaterally. GI: Soft, nontender, non-distended  MS: trace LE edema  Assessment & Plan   Acute on Chronic HFrEF -- echo 02/2024 with LVEF of 45-50%, normal RV -- presented with several days of shortness of breath and LE edema. Hypoxic on admission requiring O2. Says he avoids salt but fluid intake has been high -- on admission BNP 848, CXR with interstitial basilar scarring and atelectasis -- s/p IV lasix . Net - 12.9L, weight down 251>>238.32lbs -- recent outpatient LVEF <35% on lexiscan myoview -- Echo 10/15 LVEF 30-35%, global hypokinesis, normal RV, severely enlarged LA -- GDMT: BB stopped in the past 2/2 bradycardia/pauses.  Was started on low dose aldactone, switched to PO lasix  and Entresto 24/26mg  BID but Cr up therefore was stopped. Cr Improved, has been resumed on coreg 3.125mg  BID and  losartan 25mg  daily. Will defer further titration or additional HF therapy at this time. Resume lasix  20mg  PO daily   Persistent atrial flutter -- long hx of the same, as above no BB in the setting of bradycardia. Appears he has mostly been rate controlled for quite some time -- on Eliquis  5mg  BID   -- has been referred to EP outpatient, options are limited but suspect he may not be an ablation candidate    Hyperthyroidism -- follows with endocrinology outpatient on methimazole  -- TSH now 7.5 on admission -- per primary team    Per Primary UTI Hx of recurrent strokes PAD Hx of DVT   For questions or updates, please contact [facility] Please consult [email or web address] for contact info under     Signed, Manuelita Rummer, NP  Attestation Patient seen and examined, note reviewed with the signed Advanced Practice Provider. I personally reviewed laboratory data, imaging studies and relevant notes. I independently examined the patient and formulated the important aspects of the plan. I have personally discussed the plan with the patient and/or family. Comments or changes to the note/plan are indicated below.  Acute on chronic heart failure with reduced ejection fraction Persistent atrial flutter Hyperthyroidism PAD History of recurrent strokes  Clinically he is improving from a heart failure standpoint.  He has  had some IV Lasix  and has now switched to oral.  Agree with starting Lasix  20 mg daily.  Unfortunately his Entresto has been held giving increasing creatinine.  Beta-blocker also was held earlier in the admission due to bradycardia and intermittent pauses.  He is tolerating the Coreg 3.125 mg twice daily.  Will titrate the GDMT as appropriate.  Symptoms atrial flutter he is tolerating the current dose of beta-blocker, he has permanent atrial flutter.  Currently he is on Eliquis  5 mg twice daily.  If his creatinine does not improve tomorrow and continues to go up we will have  to adjust his dosing to 2.5 mg twice daily to account for given age and creatinine.  Will continue to monitor his blood pressure no changes with medications today. In terms of previous stroke continue his atorvastatin  on Eliquis , no aspirin  at this time.   Per primary team UTI Hypothyroidism  Dub Huntsman DO, MS [facility] Attending Cardiologist [facility]  [address] [place], [place] [address] [phone] Website: [email or web address]

## 2024-09-16 NOTE — Progress Notes (Signed)
 Mobility Specialist Progress Note;   09/16/24 1057  Mobility  Activity Ambulated with assistance  Level of Assistance Minimal assist, patient does 75% or more  Assistive Device Front wheel walker  Distance Ambulated (ft) 100 ft  Activity Response Tolerated well  Mobility Referral Yes  Mobility visit 1 Mobility  Mobility Specialist Start Time (ACUTE ONLY) 1057  Mobility Specialist Stop Time (ACUTE ONLY) 1107  Mobility Specialist Time Calculation (min) (ACUTE ONLY) 10 min   Pt in chair upon arrival, agreeable to mobility. Required MinA to stand from chair, MinG during ambulation. Chair follow for safety however pt did not need during session. Able to ambulate on RA, VSS when able to receive accurate pleth. No c/o when asked. Pt returned back to chair and left with all needs met, call bell in reach. RN notified.   Lauraine Erm Mobility Specialist Please contact via SecureChat or [employer] [phone]

## 2024-09-16 NOTE — Plan of Care (Signed)

## 2024-09-16 NOTE — Progress Notes (Signed)
 PROGRESS NOTE    Jesus Lam  FMW:[id number] DOB: 1933/11/12 DOA: 09/06/2024 PCP: Georgina Speaks, FNP   Brief Narrative:  This 89 yrs old Male with PMH significant for chronic HFrEF( last EF measured was on August 23, 2024 when patient had myocardial perfusion scan which was negative for ischemia or infarct but did show EF of less than 35% ) history of recurrent stroke, prior history of DVT,  hypertension and recent diagnosis of atrial flutter presents to the ED with complaints of increasing shortness of breath and peripheral edema worsening over the last 2 weeks.  Denies any chest pain,  productive cough,  fever or chills.  Patient states he has been compliant with his Lasix  20 mg daily.  He also has been more cautious about his diet recently.  In ED: EKG showing atrial flutter, controlled rate. Chest x-ray was unremarkable. proBNP was 846.  On exam patient has significant edema, he was given Lasix  IV and admitted for further evaluation.  Patient was admitted for acute on chronic systolic CHF.  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  Now clinically improved following inpatient diuresis. Restarted on low-dose lasix , low-dose losartan and low-dose coreg Jardiance now stopped given below UTI. Continue supplemental oxygen and wean as tolerated. Cardiology is following.  Patient is given Lasix  80 mg IV once yesterday due to slight  volume load. Monitor serum creatinine while on Lasix .   Intake/Output Summary (Last 24 hours) at 09/16/2024 1038 Last data filed at 09/16/2024 0801 Gross per 24 hour  Intake 120 ml  Output 2550 ml  Net -2430 ml      Atrial flutter (HCC): Heart rate well-controlled, referral to EP as outpt noted. Continue anticoagulation with Eliquis .   CKD stage 3a, GFR 45-59 ml/min (HCC) - baseline Scr 1.1-1.3 Baseline creatinine remains around 1.1-1.3 Creatinine peaked to 1.83, now trending down near baseline. Continue low dose lasix  and low dose ARB per Cardiology. Recheck bmet in AM.   Hyperthyroidism: Continue tapazole  10 mg daily.   Dysarthria as late effect of cerebellar cerebrovascular accident (CVA) Chronic.  Stable,  PT and OT > SNF, pending insurance Auth.   Normochromic , Normocytic Anemia Iron low 32, sat ratio 9% Hgb trends stable. Monitor CBC   Essential hypertension: BP's are controlled, soft at times.  Continue hydralazine . Holding amlodipine , aldactone.   Resumed low dose lasix , low dose coreg, low dose arb.   Symptomatic UTI: Patient presented with dysuria, UA suggestive of UTI Urine cx thus far pos for enterococcus and ecoli, pending sensitivities Patient continued on empiric ceftriaxone .  Obesity, Class I, BMI 30-34.9 Body mass index is 34.09 kg/m. Diet and exercise discussed in detail.    DVT prophylaxis: Eliquis   Code Status: Full code Family Communication: No family at bed side Disposition Plan:    Status is: Inpatient Remains inpatient appropriate because: Medically clear but monitoring serum creatinine while on IV diuresis.  Anticipated discharge to SNF on  09/17/2024.    Consultants:  Cardiology  Procedures:  Antimicrobials: Anti-infectives (From admission, onward)    Start     Dose/Rate Route Frequency Ordered Stop   09/15/24 1115  fosfomycin (MONUROL) packet 3 g        3 g Oral  Once 09/15/24 1027 09/15/24 1748   09/13/24 1215   cefTRIAXone  (ROCEPHIN ) 1 g in sodium chloride  0.9 % 100 mL IVPB  Status:  Discontinued        1 g 200 mL/hr over 30 Minutes Intravenous Every 24 hours 09/13/24 1120 09/15/24 1027       Subjective: Patient was seen and examined at bedside.  Overnight events noted. Patient was sitting comfortably on the recliner, has mild edema in the legs. Patient reports feeling improved.  He denies any concerns.  Objective: Vitals:   09/15/24 1953 09/15/24 2337 09/16/24 0514 09/16/24 0753  BP: 116/79 107/70 (!) 144/79 102/62  Pulse: 76 80 75 67  Resp: 14 17 16 19   Temp: 98.1 F (36.7 C) 98.3 F (36.8 C) 98 F (36.7 C) 98.2 F (36.8 C)  TempSrc: Oral Oral Oral Oral  SpO2: 98% 96% 93% 95%  Weight:      Height:        Intake/Output Summary (Last 24 hours) at 09/16/2024 1038 Last data filed at 09/16/2024 0801 Gross per 24 hour  Intake 120 ml  Output 2550 ml  Net -2430 ml   Filed Weights   09/13/24 0504 09/14/24 0409 09/15/24 0743  Weight: 110.5 kg 108.1 kg 108.1 kg    Examination:  General exam: Appears calm and comfortable, not in any acute distress. Respiratory system: CTA Bilaterally , Respiratory effort normal. RR 14 Cardiovascular system: S1 & S2 heard, RRR. No JVD, murmurs, rubs, gallops or clicks.  Gastrointestinal system: Abdomen is non distended, soft and non tender.  Normal bowel sounds heard. Central nervous system: Alert and oriented x 3. No focal neurological deficits. Extremities: Edema+, no cyanosis, no clubbing. Skin: No rashes, lesions or ulcers Psychiatry: Judgement and insight appear normal. Mood & affect appropriate.    Data Reviewed: I have personally reviewed following labs and imaging studies  CBC: Recent Labs  Lab 09/12/24 0219 09/13/24 0411 09/14/24 0754 09/15/24 0242  WBC 5.5 4.5 4.1 4.2  HGB 11.3* 10.9* 11.2* 11.7*  HCT 36.7* 35.4* 36.1* 38.0*  MCV 93.1 93.2 91.2 92.0  PLT 206 216 229 218   Basic Metabolic Panel: Recent Labs  Lab  09/12/24 0219 09/13/24 0411 09/14/24 0512 09/15/24 0242 09/16/24 0230  NA 138 138 136 138 139  K 4.8 4.5 4.5 4.7 4.7  CL 103 105 103 106 105  CO2 25 24 25 23 24   GLUCOSE 106* 99 111* 109* 97  BUN 51* 41* 31* 32* 36*  CREATININE 1.70* 1.47* 1.39* 1.40* 1.53*  CALCIUM  8.7* 8.9 9.3 9.1 9.2  MG 2.3  --   --   --   --    GFR: Estimated Creatinine Clearance: 41.6 mL/min (A) (by C-G formula based on SCr of 1.53 mg/dL (H)). Liver Function Tests: Recent Labs  Lab 09/12/24 0219 09/13/24 0411  AST 26 21  ALT 21 20  ALKPHOS 58 50  BILITOT 0.7 0.7  PROT 7.3 7.4  ALBUMIN 2.7* 2.7*   No results for input(s): LIPASE, AMYLASE in the last 168 hours. No results for input(s): AMMONIA in the last 168 hours. Coagulation Profile: No results for input(s): INR, PROTIME in the last 168 hours. Cardiac Enzymes: No results for input(s):  CKTOTAL, CKMB, CKMBINDEX, TROPONINI in the last 168 hours. BNP (last 3 results) Recent Labs    09/17/23 1504 09/06/24 1414  PROBNP 213 846.0*   HbA1C: No results for input(s): HGBA1C in the last 72 hours. CBG: No results for input(s): GLUCAP in the last 168 hours. Lipid Profile: No results for input(s): CHOL, HDL, LDLCALC, TRIG, CHOLHDL, LDLDIRECT in the last 72 hours. Thyroid  Function Tests: No results for input(s): TSH, T4TOTAL, FREET4, T3FREE, THYROIDAB in the last 72 hours. Anemia Panel: No results for input(s): VITAMINB12, FOLATE, FERRITIN, TIBC, IRON, RETICCTPCT in the last 72 hours. Sepsis Labs: No results for input(s): PROCALCITON, LATICACIDVEN in the last 168 hours.  Recent Results (from the past 240 hours)  Resp panel by RT-PCR (RSV, Flu A&B, Covid) Anterior Nasal Swab     Status: None   Collection Time: 09/06/24  2:14 PM   Specimen: Anterior Nasal Swab  Result Value Ref Range Status   SARS Coronavirus 2 by RT PCR NEGATIVE NEGATIVE Final    Comment: (NOTE) SARS-CoV-2 target nucleic  acids are NOT DETECTED.  The SARS-CoV-2 RNA is generally detectable in upper respiratory specimens during the acute phase of infection. The lowest concentration of SARS-CoV-2 viral copies this assay can detect is 138 copies/mL. A negative result does not preclude SARS-Cov-2 infection and should not be used as the sole basis for treatment or other patient management decisions. A negative result may occur with  improper specimen collection/handling, submission of specimen other than nasopharyngeal swab, presence of viral mutation(s) within the areas targeted by this assay, and inadequate number of viral copies(<138 copies/mL). A negative result must be combined with clinical observations, patient history, and epidemiological information. The expected result is Negative.  Fact Sheet for Patients:  BloggerCourse.com  Fact Sheet for Healthcare Providers:  SeriousBroker.it  This test is no t yet approved or cleared by the [place]  FDA and  has been authorized for detection and/or diagnosis of SARS-CoV-2 by FDA under an Emergency Use Authorization (EUA). This EUA will remain  in effect (meaning this test can be used) for the duration of the COVID-19 declaration under Section 564(b)(1) of the Act, 21 U.S.C.section 360bbb-3(b)(1), unless the authorization is terminated  or revoked sooner.       Influenza A by PCR NEGATIVE NEGATIVE Final   Influenza B by PCR NEGATIVE NEGATIVE Final    Comment: (NOTE) The Xpert Xpress SARS-CoV-2/FLU/RSV plus assay is intended as an aid in the diagnosis of influenza from Nasopharyngeal swab specimens and should not be used as a sole basis for treatment. Nasal washings and aspirates are unacceptable for Xpert Xpress SARS-CoV-2/FLU/RSV testing.  Fact Sheet for Patients: BloggerCourse.com  Fact Sheet for Healthcare Providers: SeriousBroker.it  This  test is not yet approved or cleared by the [place]  FDA and has been authorized for detection and/or diagnosis of SARS-CoV-2 by FDA under an Emergency Use Authorization (EUA). This EUA will remain in effect (meaning this test can be used) for the duration of the COVID-19 declaration under Section 564(b)(1) of the Act, 21 U.S.C. section 360bbb-3(b)(1), unless the authorization is terminated or revoked.     Resp Syncytial Virus by PCR NEGATIVE NEGATIVE Final    Comment: (NOTE) Fact Sheet for Patients: BloggerCourse.com  Fact Sheet for Healthcare Providers: SeriousBroker.it  This test is not yet approved or cleared by the [place]  FDA and has been authorized for detection and/or diagnosis of SARS-CoV-2 by FDA under an Emergency Use Authorization (EUA). This EUA will remain in effect (meaning this  test can be used) for the duration of the COVID-19 declaration under Section 564(b)(1) of the Act, 21 U.S.C. section 360bbb-3(b)(1), unless the authorization is terminated or revoked.  Performed at [facility], [address], [place], [place] [address]   MRSA Next Gen by PCR, Nasal     Status: None   Collection Time: 09/06/24 10:05 PM   Specimen: Nasal Mucosa; Nasal Swab  Result Value Ref Range Status   MRSA by PCR Next Gen NOT DETECTED NOT DETECTED Final    Comment: (NOTE) The GeneXpert MRSA Assay (FDA approved for NASAL specimens only), is one component of a comprehensive MRSA colonization surveillance program. It is not intended to diagnose MRSA infection nor to guide or monitor treatment for MRSA infections. Test performance is not FDA approved in patients less than 89 years old. Performed at [facility], [address] [place], [place] [address]   Urine Culture (for pregnant, neutropenic or urologic patients or patients with an indwelling urinary catheter)     Status: Abnormal   Collection  Time: 09/13/24 11:19 AM   Specimen: Urine, Clean Catch  Result Value Ref Range Status   Specimen Description URINE, CLEAN CATCH  Final   Special Requests   Final    NONE Performed at [facility], [address], [place], [place] [address]    Culture (A)  Final    >=100,000 COLONIES/mL ENTEROCOCCUS FAECALIS 40,000 COLONIES/mL ESCHERICHIA COLI    Report Status 09/16/2024 FINAL  Final   Organism ID, Bacteria ENTEROCOCCUS FAECALIS (A)  Final   Organism ID, Bacteria ESCHERICHIA COLI (A)  Final      Susceptibility   Escherichia coli - MIC*    AMPICILLIN  >=32 RESISTANT Resistant     CEFAZOLIN (URINE) Value in next row Resistant      >=32 RESISTANTThis is a modified FDA-approved test that has been validated and its performance characteristics determined by the reporting laboratory.  This laboratory is certified under the Clinical Laboratory Improvement Amendments CLIA as qualified to perform high complexity clinical laboratory testing.    CEFEPIME Value in next row Sensitive      >=32 RESISTANTThis is a modified FDA-approved test that has been validated and its performance characteristics determined by the reporting laboratory.  This laboratory is certified under the Clinical Laboratory Improvement Amendments CLIA as qualified to perform high complexity clinical laboratory testing.    ERTAPENEM Value in next row Sensitive      >=32 RESISTANTThis is a modified FDA-approved test that has been validated and its performance characteristics determined by the reporting laboratory.  This laboratory is certified under the Clinical Laboratory Improvement Amendments CLIA as qualified to perform high complexity clinical laboratory testing.    CEFTRIAXONE  Value in next row Resistant      >=32 RESISTANTThis is a modified FDA-approved test that has been validated and its performance characteristics determined by the reporting laboratory.  This laboratory is certified under the Clinical Laboratory  Improvement Amendments CLIA as qualified to perform high complexity clinical laboratory testing.    CIPROFLOXACIN Value in next row Sensitive      >=32 RESISTANTThis is a modified FDA-approved test that has been validated and its performance characteristics determined by the reporting laboratory.  This laboratory is certified under the Clinical Laboratory Improvement Amendments CLIA as qualified to perform high complexity clinical laboratory testing.    GENTAMICIN Value in next row Sensitive      >=32 RESISTANTThis is a modified FDA-approved test that has been validated  and its performance characteristics determined by the reporting laboratory.  This laboratory is certified under the Clinical Laboratory Improvement Amendments CLIA as qualified to perform high complexity clinical laboratory testing.    NITROFURANTOIN Value in next row Sensitive      >=32 RESISTANTThis is a modified FDA-approved test that has been validated and its performance characteristics determined by the reporting laboratory.  This laboratory is certified under the Clinical Laboratory Improvement Amendments CLIA as qualified to perform high complexity clinical laboratory testing.    TRIMETH /SULFA  Value in next row Sensitive      >=32 RESISTANTThis is a modified FDA-approved test that has been validated and its performance characteristics determined by the reporting laboratory.  This laboratory is certified under the Clinical Laboratory Improvement Amendments CLIA as qualified to perform high complexity clinical laboratory testing.    AMPICILLIN /SULBACTAM Value in next row Resistant      >=32 RESISTANTThis is a modified FDA-approved test that has been validated and its performance characteristics determined by the reporting laboratory.  This laboratory is certified under the Clinical Laboratory Improvement Amendments CLIA as qualified to perform high complexity clinical laboratory testing.    PIP/TAZO Value in next row Intermediate       64 INTERMEDIATEThis is a modified FDA-approved test that has been validated and its performance characteristics determined by the reporting laboratory.  This laboratory is certified under the Clinical Laboratory Improvement Amendments CLIA as qualified to perform high complexity clinical laboratory testing.    MEROPENEM Value in next row Sensitive      64 INTERMEDIATEThis is a modified FDA-approved test that has been validated and its performance characteristics determined by the reporting laboratory.  This laboratory is certified under the Clinical Laboratory Improvement Amendments CLIA as qualified to perform high complexity clinical laboratory testing.    * 40,000 COLONIES/mL ESCHERICHIA COLI   Enterococcus faecalis - MIC*    AMPICILLIN  Value in next row Sensitive      64 INTERMEDIATEThis is a modified FDA-approved test that has been validated and its performance characteristics determined by the reporting laboratory.  This laboratory is certified under the Clinical Laboratory Improvement Amendments CLIA as qualified to perform high complexity clinical laboratory testing.    NITROFURANTOIN Value in next row Sensitive      64 INTERMEDIATEThis is a modified FDA-approved test that has been validated and its performance characteristics determined by the reporting laboratory.  This laboratory is certified under the Clinical Laboratory Improvement Amendments CLIA as qualified to perform high complexity clinical laboratory testing.    VANCOMYCIN  Value in next row Sensitive      64 INTERMEDIATEThis is a modified FDA-approved test that has been validated and its performance characteristics determined by the reporting laboratory.  This laboratory is certified under the Clinical Laboratory Improvement Amendments CLIA as qualified to perform high complexity clinical laboratory testing.    * >=100,000 COLONIES/mL ENTEROCOCCUS FAECALIS   Radiology Studies: No results found.  Scheduled Meds:  acetaminophen    650 mg Oral TID   apixaban   5 mg Oral BID   atorvastatin   80 mg Oral Daily   carvedilol  3.125 mg Oral BID WC   diclofenac  Sodium  2 g Topical QID   losartan  25 mg Oral Daily   methimazole   10 mg Oral Daily   mirtazapine   7.5 mg Oral QHS   tamsulosin   0.8 mg Oral QPC supper   Continuous Infusions:   LOS: 10 days    Time spent: 35 mins    Darcel Dawley, MD  [facility]   If 7PM-7AM, please contact night-coverage

## 2024-09-17 DIAGNOSIS — R41841 Cognitive communication deficit: Secondary | ICD-10-CM | POA: Diagnosis not present

## 2024-09-17 DIAGNOSIS — D631 Anemia in chronic kidney disease: Secondary | ICD-10-CM | POA: Diagnosis not present

## 2024-09-17 DIAGNOSIS — I739 Peripheral vascular disease, unspecified: Secondary | ICD-10-CM | POA: Diagnosis not present

## 2024-09-17 DIAGNOSIS — I5023 Acute on chronic systolic (congestive) heart failure: Secondary | ICD-10-CM | POA: Diagnosis not present

## 2024-09-17 DIAGNOSIS — H9193 Unspecified hearing loss, bilateral: Secondary | ICD-10-CM | POA: Diagnosis not present

## 2024-09-17 DIAGNOSIS — G3184 Mild cognitive impairment, so stated: Secondary | ICD-10-CM | POA: Diagnosis not present

## 2024-09-17 DIAGNOSIS — R2681 Unsteadiness on feet: Secondary | ICD-10-CM | POA: Diagnosis not present

## 2024-09-17 DIAGNOSIS — G8929 Other chronic pain: Secondary | ICD-10-CM | POA: Diagnosis not present

## 2024-09-17 DIAGNOSIS — M6281 Muscle weakness (generalized): Secondary | ICD-10-CM | POA: Diagnosis not present

## 2024-09-17 DIAGNOSIS — I4891 Unspecified atrial fibrillation: Secondary | ICD-10-CM | POA: Diagnosis not present

## 2024-09-17 DIAGNOSIS — I130 Hypertensive heart and chronic kidney disease with heart failure and stage 1 through stage 4 chronic kidney disease, or unspecified chronic kidney disease: Secondary | ICD-10-CM | POA: Diagnosis not present

## 2024-09-17 DIAGNOSIS — N1831 Chronic kidney disease, stage 3a: Secondary | ICD-10-CM | POA: Diagnosis not present

## 2024-09-17 DIAGNOSIS — J9601 Acute respiratory failure with hypoxia: Secondary | ICD-10-CM | POA: Diagnosis not present

## 2024-09-17 DIAGNOSIS — E7800 Pure hypercholesterolemia, unspecified: Secondary | ICD-10-CM | POA: Diagnosis not present

## 2024-09-17 DIAGNOSIS — I69322 Dysarthria following cerebral infarction: Secondary | ICD-10-CM | POA: Diagnosis not present

## 2024-09-17 DIAGNOSIS — N390 Urinary tract infection, site not specified: Secondary | ICD-10-CM | POA: Diagnosis not present

## 2024-09-17 DIAGNOSIS — Z7901 Long term (current) use of anticoagulants: Secondary | ICD-10-CM | POA: Diagnosis not present

## 2024-09-17 DIAGNOSIS — I483 Typical atrial flutter: Secondary | ICD-10-CM | POA: Diagnosis not present

## 2024-09-17 DIAGNOSIS — N189 Chronic kidney disease, unspecified: Secondary | ICD-10-CM | POA: Diagnosis not present

## 2024-09-17 DIAGNOSIS — Z7189 Other specified counseling: Secondary | ICD-10-CM | POA: Diagnosis not present

## 2024-09-17 LAB — BASIC METABOLIC PANEL WITH GFR
Anion gap: 10 (ref 5–15)
BUN: 36 mg/dL — ABNORMAL HIGH (ref 8–23)
CO2: 23 mmol/L (ref 22–32)
Calcium: 8.9 mg/dL (ref 8.9–10.3)
Chloride: 104 mmol/L (ref 98–111)
Creatinine, Ser: 1.45 mg/dL — ABNORMAL HIGH (ref 0.61–1.24)
GFR, Estimated: 46 mL/min — ABNORMAL LOW (ref >60)
Glucose, Bld: 88 mg/dL (ref 70–99)
Potassium: 4.6 mmol/L (ref 3.5–5.1)
Sodium: 137 mmol/L (ref 135–145)

## 2024-09-17 MED ORDER — METHOCARBAMOL 500 MG PO TABS: 500.0000 mg | ORAL_TABLET | Freq: Three times a day (TID) | ORAL | 0 refills | Status: AC | PRN

## 2024-09-17 MED ORDER — CARVEDILOL 3.125 MG PO TABS: 3.1250 mg | ORAL_TABLET | Freq: Two times a day (BID) | ORAL | 0 refills | Status: DC

## 2024-09-17 MED ORDER — NITROFURANTOIN MONOHYD MACRO 100 MG PO CAPS
100.0000 mg | ORAL_CAPSULE | Freq: Two times a day (BID) | ORAL | Status: DC
  Administered 2024-09-17: 100 mg via ORAL
  Filled 2024-09-17 (×2): qty 1

## 2024-09-17 MED ORDER — LOSARTAN POTASSIUM 25 MG PO TABS: 25.0000 mg | ORAL_TABLET | Freq: Every day | ORAL | 0 refills | Status: DC

## 2024-09-17 MED ORDER — NITROFURANTOIN MONOHYD MACRO 100 MG PO CAPS: 100.0000 mg | ORAL_CAPSULE | Freq: Two times a day (BID) | ORAL | 0 refills | Status: AC

## 2024-09-17 NOTE — Discharge Summary (Addendum)
 Physician Discharge Summary  Jesus Lam FMW:[id number] DOB: 06-Jan-1933 DOA: 09/06/2024  PCP: Georgina Speaks, FNP  Admit date: 09/06/2024  Discharge date: 09/17/2024  Admitted From: Home  Disposition:  SNF ( [facility] )  Recommendations for Outpatient Follow-up:  Follow up with PCP in 1-2 weeks. Please obtain BMP/CBC in one week. Advised to follow-up with Cardiology as scheduled. Advised to take losartan and Coreg as prescribed. Advised to discontinue amlodipine  and hydralazine . Advised to take Macrobid twice daily for 5 days  Home Health: None Equipment/Devices: None  Discharge Condition: Stable CODE STATUS:Full code Diet recommendation: Heart Healthy   Brief Summary / Hospital Course: This 89 yrs old Male with PMH significant for chronic HFrEF( last EF measured was on August 23, 2024 when patient had myocardial perfusion scan which was negative for ischemia or infarct but did show EF of less than 35% ) history of recurrent stroke, prior history of DVT,  hypertension and recent diagnosis of atrial flutter presents to the ED with complaints of increasing shortness of breath and peripheral edema worsening over the last 2 weeks.  Denies any chest pain,  productive cough,  fever or chills.  Patient states he has been compliant with his Lasix  20 mg daily.  He also has been more cautious about his diet recently.  In ED: EKG showing atrial flutter, controlled rate. Chest x-ray was unremarkable. proBNP was 846.  On exam patient has significant edema, He was given Lasix  IV and admitted for further evaluation.  Patient was admitted for acute on chronic systolic CHF.  Cardiology was consulted.  Patient was continued on aggressive IV diuresis.  Patient is net -14 L since admission.  Patient seems back to his baseline euvolemic state.  Cardiology signed off,  recommended patient should continued on oral Lasix , Coreg and losartan.  He did not tolerate Entresto due to hypotension.  Continue  Eliquis  and Coreg.  Insurance authorization approved.  Patient being discharged to skilled nursing facility for rehab.  Advised to take Macrobid twice a day for 5 days.  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  Now clinically improved following inpatient diuresis. Restarted on low-dose lasix , low-dose losartan and low-dose coreg Jardiance now stopped given below UTI. Continue supplemental oxygen and wean as tolerated. Cardiology is following.  Patient is given Lasix  80 mg IV once yesterday due to slight volume load. Monitor serum creatinine while on Lasix . Patient is now back to his baseline euvolemic state.     Intake/Output Summary (Last 24 hours) at 09/16/2024 1038 Last data filed at 09/16/2024 0801    Gross per 24 hour  Intake 120 ml  Output 2550 ml  Net -2430 ml      Atrial flutter (HCC): Heart rate well-controlled, referral to EP as outpt noted. Continue anticoagulation with Eliquis .   CKD stage 3a, GFR 45-59 ml/min (HCC) - baseline Scr 1.1-1.3 Baseline creatinine remains around 1.1-1.3 Creatinine peaked to 1.83, now trending down near baseline.  Continue low dose lasix  and low dose ARB per Cardiology. Recheck bmet in AM.   Hyperthyroidism: Continue tapazole  10 mg daily.   Dysarthria as late effect of cerebellar cerebrovascular accident (CVA) Chronic.  Stable,  PT and OT > SNF, insurance authorization  approved.  Normochromic , Normocytic Anemia Iron low 32, sat ratio 9% Hgb trends stable. Monitor CBC   Essential hypertension: BP's are controlled, soft at times.  Continue hydralazine . Holding amlodipine , aldactone.   Resumed low dose lasix , low dose coreg, low dose arb.   Symptomatic UTI: Patient presented with dysuria, UA suggestive of UTI Urine cx thus far pos for enterococcus and ecoli, pending sensitivities Patient continued on empiric ceftriaxone . Patient discharged on Macrobid for 5 days.   Obesity, Class I, BMI 30-34.9 Body mass index is 34.09 kg/m. Diet and exercise discussed in detail.  Discharge Instructions  Discharge Instructions     Call MD for:  difficulty breathing, headache or visual disturbances   Complete by: As directed    Call MD for:  persistant dizziness or light-headedness   Complete by: As directed    Call MD for:  persistant nausea and vomiting   Complete by: As directed    Diet - low sodium heart healthy   Complete by: As directed    Diet Carb Modified   Complete by: As directed    Discharge instructions   Complete by: As directed    Advised to follow-up with primary care physician in 1 week.  Advised to follow-up with cardiology as scheduled. Advised to take losartan and Coreg as prescribed. Advised to discontinue amlodipine  and hydralazine . Advised to take Macrobid twice daily for 5 days   Increase activity slowly   Complete by: As directed       Allergies as of 09/17/2024       Reactions   Porcine (pork) Protein-containing Drug Products Other (See Comments)   Religious    Shellfish Allergy Other (See Comments)   Gout         Medication List     STOP taking these medications    amLODipine  5 MG tablet Commonly known as: NORVASC    hydrALAZINE  50 MG tablet Commonly known as: APRESOLINE        TAKE these medications    apixaban  5 MG Tabs tablet Commonly known as: ELIQUIS  Take 1 tablet (5 mg total) by mouth 2 (two)  times daily.   ascorbic acid  500 MG tablet Commonly known as: VITAMIN C  Take 500 mg daily by mouth.   atorvastatin  40 MG tablet Commonly known as: LIPITOR  Take 2 tablets (80 mg total) by mouth daily. What changed:  how much to take when to take this   azelastine  0.1 % nasal spray Commonly known as: ASTELIN  Place 2 sprays into both nostrils 2 (two) times daily. Use in each nostril as directed What changed:  when to take this reasons to take this   carvedilol 3.125 MG tablet Commonly known as: COREG Take 1 tablet (3.125 mg total) by mouth 2 (two) times daily with a meal.   COMPLETE SENIOR PO 1 tablet daily.   cyanocobalamin  500 MCG tablet Commonly known as: VITAMIN B12 Take 500 mcg by mouth daily.   diclofenac  Sodium 1 % Gel Commonly known as: VOLTAREN  APPLY TWO GRAMS TOPICALLY FOUR TIMES A DAY What changed: See the new instructions.   furosemide  20 MG tablet Commonly known as: LASIX  TAKE ONE TABLET BY MOUTH DAILY   GNP 8 Hour Arthritis Relief 650 MG CR tablet Generic  drug: acetaminophen  TAKE ONE TABLET BY MOUTH TWICE A DAY   losartan 25 MG tablet Commonly known as: COZAAR Take 1 tablet (25 mg total) by mouth daily. Start taking on: September 18, 2024   methimazole  10 MG tablet Commonly known as: TAPAZOLE  Take 1 tablet (10 mg total) by mouth daily.   methocarbamol 500 MG tablet Commonly known as: ROBAXIN Take 1 tablet (500 mg total) by mouth every 8 (eight) hours as needed for up to 10 days for muscle spasms.   mirtazapine  7.5 MG tablet Commonly known as: REMERON  TAKE 1 TABLET (7.5 MG TOTAL) BY MOUTH AT BEDTIME.   nitrofurantoin (macrocrystal-monohydrate) 100 MG capsule Commonly known as: MACROBID Take 1 capsule (100 mg total) by mouth every 12 (twelve) hours for 5 days.   tamsulosin  0.4 MG Caps capsule Commonly known as: FLOMAX  TAKE ONE CAPSULE BY MOUTH DAILY HALF HOUR FOLLOWING THE SAME MEAL DAILY What changed: See the new instructions.   Vitamin D3  125 MCG (5000 UT) Caps Take 5,000 Units by mouth daily.        Contact information for follow-up providers     Georgina Speaks, FNP Follow up in 1 week(s).   Specialty: General Practice Contact information: [address] [place] [address] [phone]         Verlin Lonni BIRCH, MD Follow up in 1 week(s).   Specialty: Cardiology Contact information: [address] [place] [address] [phone]              Contact information for after-discharge care     Destination     [facility], [place] .   Service: Skilled Nursing Contact information: [address] [place]  [address] [phone]                    Allergies  Allergen Reactions   Porcine (Pork) Protein-Containing Drug Products Other (See Comments)    Religious    Shellfish Allergy Other (See Comments)    Gout     Consultations: Cardiology   Procedures/Studies: ECHOCARDIOGRAM COMPLETE Result Date: 09/08/2024    ECHOCARDIOGRAM REPORT   Patient Name:   TADEUSZ STAHL Date of Exam: 09/08/2024 Medical Rec #:  [id number]       Height:       72.0 in Accession #:    [id number]      Weight:       248.2 lb Date of Birth:  Jan 01, 1933        BSA:          2.335 m Patient Age:    89 years        BP:           127/73 mmHg Patient Gender: M               HR:           88 bpm. Exam Location:  Inpatient Procedure: 2D Echo, Cardiac Doppler and Color Doppler (Both Spectral and Color            Flow Doppler were utilized during procedure). Indications:    CHF I50.21  History:        Patient has prior history of Echocardiogram examinations, most                 recent 03/24/2024. CHF, Stroke, Arrythmias:Atrial Flutter,                 Signs/Symptoms:Edema; Risk Factors:Hypertension. H/O  Hyperlipidemia. First degree AV block. PAD.  Sonographer:    BERNARDA ROCKS Referring Phys: [phone] LINDSAY B ROBERTS IMPRESSIONS  1. Left ventricular  ejection fraction, by estimation, is 30 to 35%. The left ventricle has moderately decreased function. The left ventricle demonstrates global hypokinesis. The left ventricular internal cavity size was mildly dilated. Left ventricular diastolic parameters are indeterminate.  2. Right ventricular systolic function is normal. The right ventricular size is normal. There is mildly elevated pulmonary artery systolic pressure.  3. Left atrial size was severely dilated.  4. Right atrial size was moderately dilated.  5. The mitral valve is normal in structure. Mild mitral valve regurgitation. No evidence of mitral stenosis.  6. The aortic valve is tricuspid. Aortic valve regurgitation is not visualized. Aortic valve sclerosis/calcification is present, without any evidence of aortic stenosis.  7. Aortic dilatation noted. There is mild dilatation of the ascending aorta, measuring 44 mm.  8. The inferior vena cava is dilated in size with >50% respiratory variability, suggesting right atrial pressure of 8 mmHg. FINDINGS  Left Ventricle: Left ventricular ejection fraction, by estimation, is 30 to 35%. The left ventricle has moderately decreased function. The left ventricle demonstrates global hypokinesis. The left ventricular internal cavity size was mildly dilated. There is no left ventricular hypertrophy. Left ventricular diastolic parameters are indeterminate. Right Ventricle: The right ventricular size is normal. Right ventricular systolic function is normal. There is mildly elevated pulmonary artery systolic pressure. The tricuspid regurgitant velocity is 2.94 m/s, and with an assumed right atrial pressure of 8 mmHg, the estimated right ventricular systolic pressure is 42.6 mmHg. Left Atrium: Left atrial size was severely dilated. Right Atrium: Right atrial size was moderately dilated. Pericardium: There is no evidence of pericardial effusion. Mitral Valve: The mitral valve is normal in structure. Mild mitral valve  regurgitation. No evidence of mitral valve stenosis. MV peak gradient, 2.5 mmHg. The mean mitral valve gradient is 1.0 mmHg. Tricuspid Valve: The tricuspid valve is normal in structure. Tricuspid valve regurgitation is trivial. No evidence of tricuspid stenosis. Aortic Valve: The aortic valve is tricuspid. Aortic valve regurgitation is not visualized. Aortic regurgitation PHT measures 402 msec. Aortic valve sclerosis/calcification is present, without any evidence of aortic stenosis. Aortic valve mean gradient measures 8.0 mmHg. Aortic valve peak gradient measures 15.8 mmHg. Aortic valve area, by VTI measures 2.68 cm. Pulmonic Valve: The pulmonic valve was normal in structure. Pulmonic valve regurgitation is not visualized. No evidence of pulmonic stenosis. Aorta: Aortic dilatation noted. There is mild dilatation of the ascending aorta, measuring 44 mm. Venous: The inferior vena cava is dilated in size with greater than 50% respiratory variability, suggesting right atrial pressure of 8 mmHg. IAS/Shunts: No atrial level shunt detected by color flow Doppler.  LEFT VENTRICLE PLAX 2D LVIDd:         5.90 cm      Diastology LVIDs:         4.90 cm      LV e' medial:    5.66 cm/s LV PW:         1.00 cm      LV E/e' medial:  15.7 LV IVS:        1.00 cm      LV e' lateral:   6.20 cm/s LVOT diam:     2.40 cm      LV E/e' lateral: 14.3 LV SV:         89 LV SV Index:   38 LVOT Area:  4.52 cm  LV Volumes (MOD) LV vol d, MOD A2C: 212.0 ml LV vol d, MOD A4C: 240.0 ml LV vol s, MOD A2C: 104.0 ml LV vol s, MOD A4C: 144.0 ml LV SV MOD A2C:     108.0 ml LV SV MOD A4C:     240.0 ml LV SV MOD BP:      101.4 ml RIGHT VENTRICLE             IVC RV Basal diam:  4.20 cm     IVC diam: 2.50 cm RV S prime:     11.00 cm/s TAPSE (M-mode): 1.8 cm RVSP:           42.6 mmHg LEFT ATRIUM              Index        RIGHT ATRIUM           Index LA diam:        4.50 cm  1.93 cm/m   RA Pressure: 8.00 mmHg LA Vol (A2C):   156.0 ml 66.80 ml/m  RA Area:      31.70 cm LA Vol (A4C):   94.4 ml  40.42 ml/m  RA Volume:   98.40 ml  42.13 ml/m LA Biplane Vol: 122.0 ml 52.24 ml/m  AORTIC VALVE                     PULMONIC VALVE AV Area (Vmax):    2.23 cm      PV Vmax:          1.06 m/s AV Area (Vmean):   2.25 cm      PV Peak grad:     4.5 mmHg AV Area (VTI):     2.68 cm      PR End Diast Vel: 1.95 msec AV Vmax:           199.00 cm/s AV Vmean:          127.000 cm/s AV VTI:            0.332 m AV Peak Grad:      15.8 mmHg AV Mean Grad:      8.0 mmHg LVOT Vmax:         97.90 cm/s LVOT Vmean:        63.200 cm/s LVOT VTI:          0.197 m LVOT/AV VTI ratio: 0.59 AI PHT:            402 msec  AORTA Ao Root diam: 3.60 cm Ao Asc diam:  4.50 cm MITRAL VALVE               TRICUSPID VALVE MV Area (PHT): 5.97 cm    TR Peak grad:   34.6 mmHg MV Area VTI:   4.71 cm    TR Vmax:        294.00 cm/s MV Peak grad:  2.5 mmHg    Estimated RAP:  8.00 mmHg MV Mean grad:  1.0 mmHg    RVSP:           42.6 mmHg MV Vmax:       0.79 m/s MV Vmean:      49.5 cm/s   SHUNTS MV Decel Time: 127 msec    Systemic VTI:  0.20 m MR Peak grad: 81.7 mmHg    Systemic Diam: 2.40 cm MR Vmax:      452.00 cm/s MV E velocity: 88.60 cm/s MV A velocity: 43.30 cm/s  MV E/A ratio:  2.05 Redell Shallow MD Electronically signed by Redell Shallow MD Signature Date/Time: 09/08/2024/12:40:50 PM    Final    DG Chest Port 1 View Result Date: 09/06/2024 CLINICAL DATA:  Shortness of breath and leg swelling. EXAM: PORTABLE CHEST 1 VIEW COMPARISON:  08/06/2023 FINDINGS: The heart is within normal limits in size given the AP projection and portable technique. There is moderate tortuosity and calcification of the thoracic aorta which appears stable. Chronic interstitial basilar scarring changes and possible streaky atelectasis but no definite infiltrates, effusions or edema. IMPRESSION: Chronic interstitial basilar scarring changes and possible streaky atelectasis. No pulmonary edema or pleural effusions. Electronically Signed    By: MYRTIS Stammer M.D.   On: 09/06/2024 14:31   MYOCARDIAL PERFUSION IMAGING Result Date: 08/23/2024   Findings are consistent with no ischemia and no infarction. The study is high risk due to LVEF < 35%.   No ST deviation was noted.   LV perfusion is normal.   Left ventricular function is abnormal. Global function is moderately reduced. Nuclear stress EF: 33%. The left ventricular ejection fraction is moderately decreased (30-44%). End diastolic cavity size is moderately enlarged. End systolic cavity size is moderately enlarged.   Prior study not available for comparison.    Subjective: Patient was seen and examined at bedside.  Overnight events noted. Patient reports feeling much improved.  He is comfortable and wants to be discharged.  Discharge Exam: Vitals:   09/17/24 0630 09/17/24 0754  BP: (!) 142/84 96/68  Pulse: 76 65  Resp:  18  Temp:  98.3 F (36.8 C)  SpO2:  96%   Vitals:   09/16/24 2353 09/17/24 0510 09/17/24 0630 09/17/24 0754  BP: 113/77 107/69 (!) 142/84 96/68  Pulse: 64 90 76 65  Resp: 20 19  18   Temp: 98 F (36.7 C) 98 F (36.7 C)  98.3 F (36.8 C)  TempSrc: Oral Oral  Oral  SpO2: 97% 98%  96%  Weight:  109 kg    Height:        General: Pt is alert, awake, not in acute distress Cardiovascular: RRR, S1/S2 +, no rubs, no gallops Respiratory: CTA bilaterally, no wheezing, no rhonchi Abdominal: Soft, NT, ND, bowel sounds + Extremities: no edema, no cyanosis    The results of significant diagnostics from this hospitalization (including imaging, microbiology, ancillary and laboratory) are listed below for reference.     Microbiology: Recent Results (from the past 240 hours)  Urine Culture (for pregnant, neutropenic or urologic patients or patients with an indwelling urinary catheter)     Status: Abnormal   Collection Time: 09/13/24 11:19 AM   Specimen: Urine, Clean Catch  Result Value Ref Range Status   Specimen Description URINE, CLEAN CATCH  Final    Special Requests   Final    NONE Performed at [facility], [address], [place], [place] [address]    Culture (A)  Final    >=100,000 COLONIES/mL ENTEROCOCCUS FAECALIS 40,000 COLONIES/mL ESCHERICHIA COLI    Report Status 09/16/2024 FINAL  Final   Organism ID, Bacteria ENTEROCOCCUS FAECALIS (A)  Final   Organism ID, Bacteria ESCHERICHIA COLI (A)  Final      Susceptibility   Escherichia coli - MIC*    AMPICILLIN  >=32 RESISTANT Resistant     CEFAZOLIN (URINE) Value in next row Resistant      >=32 RESISTANTThis is a modified FDA-approved test that has been validated and its performance characteristics determined by the reporting laboratory.  This laboratory is certified under the Clinical Laboratory Improvement Amendments CLIA as qualified to perform high complexity clinical laboratory testing.    CEFEPIME Value in next row Sensitive      >=32 RESISTANTThis is a modified FDA-approved test that has been validated and its performance characteristics determined by the reporting laboratory.  This laboratory is certified under the Clinical Laboratory Improvement Amendments CLIA as qualified to perform high complexity clinical laboratory testing.    ERTAPENEM Value in next row Sensitive      >=32 RESISTANTThis is a modified FDA-approved test that has been validated and its performance characteristics determined by the reporting laboratory.  This laboratory is certified under the Clinical Laboratory Improvement Amendments CLIA as qualified to perform high complexity clinical laboratory testing.    CEFTRIAXONE  Value in next row Resistant      >=32 RESISTANTThis is a modified FDA-approved test that has been validated and its performance characteristics determined by the reporting laboratory.  This laboratory is certified under the Clinical Laboratory Improvement Amendments CLIA as qualified to perform high complexity clinical laboratory testing.    CIPROFLOXACIN Value in next row Sensitive       >=32 RESISTANTThis is a modified FDA-approved test that has been validated and its performance characteristics determined by the reporting laboratory.  This laboratory is certified under the Clinical Laboratory Improvement Amendments CLIA as qualified to perform high complexity clinical laboratory testing.    GENTAMICIN Value in next row Sensitive      >=32 RESISTANTThis is a modified FDA-approved test that has been validated and its performance characteristics determined by the reporting laboratory.  This laboratory is certified under the Clinical Laboratory Improvement Amendments CLIA as qualified to perform high complexity clinical laboratory testing.    NITROFURANTOIN Value in next row Sensitive      >=32 RESISTANTThis is a modified FDA-approved test that has been validated and its performance characteristics determined by the reporting laboratory.  This laboratory is certified under the Clinical Laboratory Improvement Amendments CLIA as qualified to perform high complexity clinical laboratory testing.    TRIMETH /SULFA  Value in next row Sensitive      >=32 RESISTANTThis is a modified FDA-approved test that has been validated and its performance characteristics determined by the reporting laboratory.  This laboratory is certified under the Clinical Laboratory Improvement Amendments CLIA as qualified to perform high complexity clinical laboratory testing.    AMPICILLIN /SULBACTAM Value in next row Resistant      >=32 RESISTANTThis is a modified FDA-approved test that has been validated and its performance characteristics determined by the reporting laboratory.  This laboratory is certified under the Clinical Laboratory Improvement Amendments CLIA as qualified to perform high complexity clinical laboratory testing.    PIP/TAZO Value in next row Intermediate      64 INTERMEDIATEThis is a modified FDA-approved test that has been validated and its performance characteristics determined by the reporting  laboratory.  This laboratory is certified under the Clinical Laboratory Improvement Amendments CLIA as qualified to perform high complexity clinical laboratory testing.    MEROPENEM Value in next row Sensitive      64 INTERMEDIATEThis is a modified FDA-approved test that has been validated and its performance characteristics determined by the reporting laboratory.  This laboratory is certified under the Clinical Laboratory Improvement Amendments CLIA as qualified to perform high complexity clinical laboratory testing.    * 40,000 COLONIES/mL ESCHERICHIA COLI   Enterococcus faecalis - MIC*    AMPICILLIN  Value in next row Sensitive  82 INTERMEDIATEThis is a modified FDA-approved test that has been validated and its performance characteristics determined by the reporting laboratory.  This laboratory is certified under the Clinical Laboratory Improvement Amendments CLIA as qualified to perform high complexity clinical laboratory testing.    NITROFURANTOIN Value in next row Sensitive      64 INTERMEDIATEThis is a modified FDA-approved test that has been validated and its performance characteristics determined by the reporting laboratory.  This laboratory is certified under the Clinical Laboratory Improvement Amendments CLIA as qualified to perform high complexity clinical laboratory testing.    VANCOMYCIN  Value in next row Sensitive      64 INTERMEDIATEThis is a modified FDA-approved test that has been validated and its performance characteristics determined by the reporting laboratory.  This laboratory is certified under the Clinical Laboratory Improvement Amendments CLIA as qualified to perform high complexity clinical laboratory testing.    * >=100,000 COLONIES/mL ENTEROCOCCUS FAECALIS  Urine Culture (for pregnant, neutropenic or urologic patients or patients with an indwelling urinary catheter)     Status: Abnormal (Preliminary result)   Collection Time: 09/16/24  9:02 AM   Specimen: Urine, Clean  Catch  Result Value Ref Range Status   Specimen Description URINE, CLEAN CATCH  Final   Special Requests NONE  Final   Culture (A)  Final    90,000 COLONIES/mL ENTEROCOCCUS FAECALIS SUSCEPTIBILITIES TO FOLLOW Performed at [facility], [address], [place], [place] [address]    Report Status PENDING  Incomplete     Labs: BNP (last 3 results) No results for input(s): BNP in the last 8760 hours. Basic Metabolic Panel: Recent Labs  Lab 09/12/24 0219 09/13/24 0411 09/14/24 0512 09/15/24 0242 09/16/24 0230 09/17/24 0454  NA 138 138 136 138 139 137  K 4.8 4.5 4.5 4.7 4.7 4.6  CL 103 105 103 106 105 104  CO2 25 24 25 23 24 23   GLUCOSE 106* 99 111* 109* 97 88  BUN 51* 41* 31* 32* 36* 36*  CREATININE 1.70* 1.47* 1.39* 1.40* 1.53* 1.45*  CALCIUM  8.7* 8.9 9.3 9.1 9.2 8.9  MG 2.3  --   --   --   --   --    Liver Function Tests: Recent Labs  Lab 09/12/24 0219 09/13/24 0411  AST 26 21  ALT 21 20  ALKPHOS 58 50  BILITOT 0.7 0.7  PROT 7.3 7.4  ALBUMIN 2.7* 2.7*   No results for input(s): LIPASE, AMYLASE in the last 168 hours. No results for input(s): AMMONIA in the last 168 hours. CBC: Recent Labs  Lab 09/12/24 0219 09/13/24 0411 09/14/24 0754 09/15/24 0242  WBC 5.5 4.5 4.1 4.2  HGB 11.3* 10.9* 11.2* 11.7*  HCT 36.7* 35.4* 36.1* 38.0*  MCV 93.1 93.2 91.2 92.0  PLT 206 216 229 218   Cardiac Enzymes: No results for input(s): CKTOTAL, CKMB, CKMBINDEX, TROPONINI in the last 168 hours. BNP: Invalid input(s): POCBNP CBG: No results for input(s): GLUCAP in the last 168 hours. D-Dimer No results for input(s): DDIMER in the last 72 hours. Hgb A1c No results for input(s): HGBA1C in the last 72 hours. Lipid Profile No results for input(s): CHOL, HDL, LDLCALC, TRIG, CHOLHDL, LDLDIRECT in the last 72 hours. Thyroid  function studies No results for input(s): TSH, T4TOTAL, T3FREE, THYROIDAB in the last 72 hours.  Invalid  input(s): FREET3 Anemia work up No results for input(s): VITAMINB12, FOLATE, FERRITIN, TIBC, IRON, RETICCTPCT in the last 72 hours. Urinalysis    Component Value Date/Time  COLORURINE YELLOW 09/13/2024 1056   APPEARANCEUR CLOUDY (A) 09/13/2024 1056   LABSPEC 1.013 09/13/2024 1056   PHURINE 6.0 09/13/2024 1056   GLUCOSEU >=500 (A) 09/13/2024 1056   HGBUR MODERATE (A) 09/13/2024 1056   BILIRUBINUR NEGATIVE 09/13/2024 1056   BILIRUBINUR Negative 11/03/2023 1642   KETONESUR NEGATIVE 09/13/2024 1056   PROTEINUR 30 (A) 09/13/2024 1056   UROBILINOGEN 0.2 11/03/2023 1642   UROBILINOGEN 0.2 03/26/2022 1619   NITRITE NEGATIVE 09/13/2024 1056   LEUKOCYTESUR LARGE (A) 09/13/2024 1056   Sepsis Labs Recent Labs  Lab 09/12/24 0219 09/13/24 0411 09/14/24 0754 09/15/24 0242  WBC 5.5 4.5 4.1 4.2   Microbiology Recent Results (from the past 240 hours)  Urine Culture (for pregnant, neutropenic or urologic patients or patients with an indwelling urinary catheter)     Status: Abnormal   Collection Time: 09/13/24 11:19 AM   Specimen: Urine, Clean Catch  Result Value Ref Range Status   Specimen Description URINE, CLEAN CATCH  Final   Special Requests   Final    NONE Performed at [facility], [address], [place], [place] [address]    Culture (A)  Final    >=100,000 COLONIES/mL ENTEROCOCCUS FAECALIS 40,000 COLONIES/mL ESCHERICHIA COLI    Report Status 09/16/2024 FINAL  Final   Organism ID, Bacteria ENTEROCOCCUS FAECALIS (A)  Final   Organism ID, Bacteria ESCHERICHIA COLI (A)  Final      Susceptibility   Escherichia coli - MIC*    AMPICILLIN  >=32 RESISTANT Resistant     CEFAZOLIN (URINE) Value in next row Resistant      >=32 RESISTANTThis is a modified FDA-approved test that has been validated and its performance characteristics determined by the reporting laboratory.  This laboratory is certified under the Clinical Laboratory Improvement Amendments CLIA as  qualified to perform high complexity clinical laboratory testing.    CEFEPIME Value in next row Sensitive      >=32 RESISTANTThis is a modified FDA-approved test that has been validated and its performance characteristics determined by the reporting laboratory.  This laboratory is certified under the Clinical Laboratory Improvement Amendments CLIA as qualified to perform high complexity clinical laboratory testing.    ERTAPENEM Value in next row Sensitive      >=32 RESISTANTThis is a modified FDA-approved test that has been validated and its performance characteristics determined by the reporting laboratory.  This laboratory is certified under the Clinical Laboratory Improvement Amendments CLIA as qualified to perform high complexity clinical laboratory testing.    CEFTRIAXONE  Value in next row Resistant      >=32 RESISTANTThis is a modified FDA-approved test that has been validated and its performance characteristics determined by the reporting laboratory.  This laboratory is certified under the Clinical Laboratory Improvement Amendments CLIA as qualified to perform high complexity clinical laboratory testing.    CIPROFLOXACIN Value in next row Sensitive      >=32 RESISTANTThis is a modified FDA-approved test that has been validated and its performance characteristics determined by the reporting laboratory.  This laboratory is certified under the Clinical Laboratory Improvement Amendments CLIA as qualified to perform high complexity clinical laboratory testing.    GENTAMICIN Value in next row Sensitive      >=32 RESISTANTThis is a modified FDA-approved test that has been validated and its performance characteristics determined by the reporting laboratory.  This laboratory is certified under the Clinical Laboratory Improvement Amendments CLIA as qualified to perform high complexity clinical laboratory testing.    NITROFURANTOIN Value in next row  Sensitive      >=32 RESISTANTThis is a modified  FDA-approved test that has been validated and its performance characteristics determined by the reporting laboratory.  This laboratory is certified under the Clinical Laboratory Improvement Amendments CLIA as qualified to perform high complexity clinical laboratory testing.    TRIMETH /SULFA  Value in next row Sensitive      >=32 RESISTANTThis is a modified FDA-approved test that has been validated and its performance characteristics determined by the reporting laboratory.  This laboratory is certified under the Clinical Laboratory Improvement Amendments CLIA as qualified to perform high complexity clinical laboratory testing.    AMPICILLIN /SULBACTAM Value in next row Resistant      >=32 RESISTANTThis is a modified FDA-approved test that has been validated and its performance characteristics determined by the reporting laboratory.  This laboratory is certified under the Clinical Laboratory Improvement Amendments CLIA as qualified to perform high complexity clinical laboratory testing.    PIP/TAZO Value in next row Intermediate      64 INTERMEDIATEThis is a modified FDA-approved test that has been validated and its performance characteristics determined by the reporting laboratory.  This laboratory is certified under the Clinical Laboratory Improvement Amendments CLIA as qualified to perform high complexity clinical laboratory testing.    MEROPENEM Value in next row Sensitive      64 INTERMEDIATEThis is a modified FDA-approved test that has been validated and its performance characteristics determined by the reporting laboratory.  This laboratory is certified under the Clinical Laboratory Improvement Amendments CLIA as qualified to perform high complexity clinical laboratory testing.    * 40,000 COLONIES/mL ESCHERICHIA COLI   Enterococcus faecalis - MIC*    AMPICILLIN  Value in next row Sensitive      64 INTERMEDIATEThis is a modified FDA-approved test that has been validated and its performance  characteristics determined by the reporting laboratory.  This laboratory is certified under the Clinical Laboratory Improvement Amendments CLIA as qualified to perform high complexity clinical laboratory testing.    NITROFURANTOIN Value in next row Sensitive      64 INTERMEDIATEThis is a modified FDA-approved test that has been validated and its performance characteristics determined by the reporting laboratory.  This laboratory is certified under the Clinical Laboratory Improvement Amendments CLIA as qualified to perform high complexity clinical laboratory testing.    VANCOMYCIN  Value in next row Sensitive      64 INTERMEDIATEThis is a modified FDA-approved test that has been validated and its performance characteristics determined by the reporting laboratory.  This laboratory is certified under the Clinical Laboratory Improvement Amendments CLIA as qualified to perform high complexity clinical laboratory testing.    * >=100,000 COLONIES/mL ENTEROCOCCUS FAECALIS  Urine Culture (for pregnant, neutropenic or urologic patients or patients with an indwelling urinary catheter)     Status: Abnormal (Preliminary result)   Collection Time: 09/16/24  9:02 AM   Specimen: Urine, Clean Catch  Result Value Ref Range Status   Specimen Description URINE, CLEAN CATCH  Final   Special Requests NONE  Final   Culture (A)  Final    90,000 COLONIES/mL ENTEROCOCCUS FAECALIS SUSCEPTIBILITIES TO FOLLOW Performed at [facility], [address], [place], [place] [address]    Report Status PENDING  Incomplete     Time coordinating discharge: Over 30 minutes  SIGNED:   Darcel Dawley, MD  [facility] 09/17/2024, 12:53 PM Pager   If 7PM-7AM, please contact night-coverage

## 2024-09-17 NOTE — Progress Notes (Signed)
 Physical Therapy Treatment Patient Details Name: Jesus Lam MRN: [id number] DOB: 10/04/1933 Today's Date: 09/17/2024   History of Present Illness Pt is an 89 y/o male admitted due to acute CHF exacerbation. PMH: atrial flutter, CHF, CVA, hyperthyroidism, HTN, BPH.    PT Comments  Pt received in chair, agreeable to therapy session and with good participation and tolerance for transfer, gait and standing exercises. Pt needing mostly minA for transfers, esp when only using single UE support to push up, and heavy minA/light modA for stepping up/down 7 platform in room for LE strengthening. SpO2 90-94% on RA with exertion, and 92-94% on RA at rest, HR and BP WFL. Patient will benefit from continued inpatient follow up therapy, <3 hours/day.    If plan is discharge home, recommend the following: A lot of help with walking and/or transfers;A little help with walking and/or transfers;A little help with bathing/dressing/bathroom;Assistance with cooking/housework;Assist for transportation;Supervision due to cognitive status   Can travel by private vehicle     Yes  Equipment Recommendations  None recommended by PT    Recommendations for Other Services       Precautions / Restrictions Precautions Precautions: Fall Recall of Precautions/Restrictions: Intact Precaution/Restrictions Comments: Fall mat placed in front of chair; no chair alarm pad seen when PTA arrived, pt impending DC and able to demonstrate proper call bell use Restrictions Weight Bearing Restrictions Per Provider Order: No     Mobility  Bed Mobility               General bed mobility comments: in recliner on entry, pt requesting to remain in chair    Transfers Overall transfer level: Needs assistance Equipment used: Rolling walker (2 wheels) Transfers: Sit to/from Stand Sit to Stand: Contact guard assist, Min assist           General transfer comment: CGA from chair when using BUE on arm rests; minA when  pushing from L arm rest and R hand on his knee; able to perform reciprocal STS x10 without significant c/o fatigue, but noted DOE 2-3/4    Ambulation/Gait Ambulation/Gait assistance: Min Chemical engineer (Feet): 10 Feet Assistive device: Rolling walker (2 wheels) Gait Pattern/deviations: Decreased stride length, Trunk flexed       General Gait Details: Distance limited due to pt c/o bil knee pain L>R after performing reciprocal transfers, standing HEP and step-ups onto platform in room.   Stairs Stairs: Yes Stairs assistance: Min assist Stair Management: Two rails, Step to pattern, Forwards, Backwards Number of Stairs: 10 General stair comments: pt ascending/descending single step x5 reps x2 sets for BLE strengthening, leading with RLE and stepping down with LLE. Consistent light modA/heavy minA for stability.   Wheelchair Mobility     Tilt Bed    Modified Rankin (Stroke Patients Only)       Balance Overall balance assessment: Needs assistance Sitting-balance support: Feet supported, No upper extremity supported Sitting balance-Leahy Scale: Fair Sitting balance - Comments: tendency to posterior lean in chair, but denies fatigue   Standing balance support: Bilateral upper extremity supported Standing balance-Leahy Scale: Poor Standing balance comment: HHA for 1-2 steps wtihout RW and min/modA with very flexed trunk, with RW minA                            Communication Communication Communication: Impaired Factors Affecting Communication: Reduced clarity of speech;Hearing impaired;Difficulty expressing self  Cognition Arousal: Alert Behavior During Therapy: WFL for tasks assessed/performed, Flat affect  PT - Cognitive impairments: Awareness, Memory, Problem solving, Safety/Judgement                       PT - Cognition Comments: Decreased carryover of cues for safe UE placement for stand>sit Following commands: Intact Following commands  impaired: Only follows one step commands consistently, Follows one step commands with increased time    Cueing Cueing Techniques: Verbal cues, Gestural cues  Exercises General Exercises - Lower Extremity Hip Flexion/Marching: AROM, Both, 15 reps, Standing (with RW) Heel Raises: AROM, Both, 10 reps, Standing (at RW) Mini-Sqauts: AROM, Both, 15 reps, Standing (cues to stop at 10 but pt perfoms 5 more (c/o bil knee pain))    General Comments General comments (skin integrity, edema, etc.): SpO2 90-94% on RA with exertion; HR WFL per monitor, BP 140/81 (95) in chair post-exertion      Pertinent Vitals/Pain Pain Assessment Pain Assessment: Faces Faces Pain Scale: Hurts whole lot Pain Location: L>R knee with standing exercises Pain Descriptors / Indicators: Discomfort, Grimacing, Guarding, Sore, Aching, Moaning Pain Intervention(s): Limited activity within patient's tolerance, Monitored during session, Repositioned, Patient requesting pain meds-RN notified, Other (comment) (pt requesting topical meds for his knees)    Home Living                          Prior Function            PT Goals (current goals can now be found in the care plan section) Acute Rehab PT Goals Patient Stated Goal: get up easier PT Goal Formulation: With patient Time For Goal Achievement: 09/23/24 Progress towards PT goals: Progressing toward goals    Frequency    Min 2X/week      PT Plan      Co-evaluation              AM-PAC PT 6 Clicks Mobility   Outcome Measure  Help needed turning from your back to your side while in a flat bed without using bedrails?: A Lot Help needed moving from lying on your back to sitting on the side of a flat bed without using bedrails?: A Lot (w/o rail) Help needed moving to and from a bed to a chair (including a wheelchair)?: A Little Help needed standing up from a chair using your arms (e.g., wheelchair or bedside chair)?: A Little Help needed to  walk in hospital room?: A Little Help needed climbing 3-5 steps with a railing? : A Lot (with single rail would need +2 safety) 6 Click Score: 15    End of Session Equipment Utilized During Treatment: Gait belt Activity Tolerance: Patient tolerated treatment well;Other (comment) (bil knee pain) Patient left: in chair;with call bell/phone within reach;Other (comment) (RN notified pt does not have chair alarm but is able to demo back call bell use - fall mat placed in front of chair due to high fall risk, pt has yellow bracelet on.) Nurse Communication: Mobility status;Patient requests pain meds;Other (comment) (request bil knee topical pain meds) PT Visit Diagnosis: Muscle weakness (generalized) (M62.81);Other abnormalities of gait and mobility (R26.89);Difficulty in walking, not elsewhere classified (R26.2);Pain Pain - Right/Left: Left (L>R) Pain - part of body: Knee     Time: 1218-1237 PT Time Calculation (min) (ACUTE ONLY): 19 min  Charges:    $Therapeutic Exercise: 8-22 mins PT General Charges $$ ACUTE PT VISIT: 1 Visit  Connell SQUIBB., PTA Acute Rehabilitation Services Secure Chat Preferred 9a-5:30pm Office: [phone]    Connell HERO [facility] 09/17/2024, 12:58 PM

## 2024-09-17 NOTE — Progress Notes (Signed)
 Progress Note  Patient Name: Jesus Lam Date of Encounter: 09/17/2024 [facility] Cardiologist: Newman JINNY Lawrence, MD   Interval Summary    No complaints this morning.   Vital Signs Vitals:   09/16/24 2353 09/17/24 0510 09/17/24 0630 09/17/24 0754  BP: 113/77 107/69 (!) 142/84 96/68  Pulse: 64 90 76 65  Resp: 20 19  18   Temp: 98 F (36.7 C) 98 F (36.7 C)  98.3 F (36.8 C)  TempSrc: Oral Oral  Oral  SpO2: 97% 98%  96%  Weight:  109 kg    Height:        Intake/Output Summary (Last 24 hours) at 09/17/2024 0835 Last data filed at 09/17/2024 9487 Gross per 24 hour  Intake --  Output 1175 ml  Net -1175 ml      09/17/2024    5:10 AM 09/16/2024   11:05 AM 09/15/2024    7:43 AM  Last 3 Weights  Weight (lbs) 240 lb 4.8 oz 241 lb 3.2 oz 238 lb 5.1 oz  Weight (kg) 109 kg 109.408 kg 108.1 kg      Telemetry/ECG   Atrial flutter. No long pauses  Physical Exam  GEN: NAD Neck: No JVD Cardiac: Irregular, no murmurs Respiratory: clear bilaterally GI: soft, NT MS: Trace bilateral LE edema  Labs:     Latest Ref Rng & Units 09/17/2024    4:54 AM 09/16/2024    2:30 AM 09/15/2024    2:42 AM  BMP  Glucose 70 - 99 mg/dL 88  97  890   BUN 8 - 23 mg/dL 36  36  32   Creatinine 0.61 - 1.24 mg/dL 8.54  8.46  8.59   Sodium 135 - 145 mmol/L 137  139  138   Potassium 3.5 - 5.1 mmol/L 4.6  4.7  4.7   Chloride 98 - 111 mmol/L 104  105  106   CO2 22 - 32 mmol/L 23  24  23    Calcium  8.9 - 10.3 mg/dL 8.9  9.2  9.1     Current medications:   acetaminophen   650 mg Oral TID   apixaban   5 mg Oral BID   atorvastatin   80 mg Oral Daily   carvedilol  3.125 mg Oral BID WC   diclofenac  Sodium  2 g Topical QID   furosemide   20 mg Oral Daily   levofloxacin  250 mg Oral Daily   losartan  25 mg Oral Daily   methimazole   10 mg Oral Daily   mirtazapine   7.5 mg Oral QHS   tamsulosin   0.8 mg Oral QPC supper     Assessment & Plan   Acute on Chronic HFrEF: Echo in  April 2025 with LVEF 45%. Echo this admission with LVEF=30-35%. He was admitted with increased LE edema and dyspnea. On admission BNP 848, CXR with interstitial basilar scarring and atelectasis. He has been diuresed and is now down 14 liters. Negative 1175 cc last 24 hours.  He is now on oral Lasix  and his volume status is ok.  Continue Lasix .  Continue Coreg and Losartan. Beta blocker has been stopped in the past 2/2 bradycardia/pauses but he is tolerating this dose. He did not tolerate Entresto secondary to hypotension.   Persistent atrial flutter: Rate controlled on low dose Coreg. Continue Coreg and Eliquis .   Nothing to change from our perspective today. Should be ok to be discharged from cardiac perspective.    For questions or updates, please contact [facility] Please consult  [email or web address] for contact info under    Signed, Lonni Cash, MD, [facility] 09/17/2024 8:36 AM

## 2024-09-17 NOTE — Progress Notes (Signed)
 Report given to [facility]

## 2024-09-17 NOTE — Progress Notes (Signed)
 Daughter, [name], made aware of [facility] picking up patient. [name] will meet them at [facility]. All belongings sent with [facility].

## 2024-09-17 NOTE — Plan of Care (Signed)

## 2024-09-17 NOTE — TOC Transition Note (Addendum)
 Transition of Care [facility]) - Discharge Note   Patient Details  Name: Jesus Lam MRN: [id number] Date of Birth: December 17, 1932  [facility] of [facility] CM/SW Contact:  Lauraine FORBES Saa, LCSWA Phone Number: 09/17/2024, 12:53 PM   Clinical Narrative:     Patient will DC to: [facility] Anticipated DC date: 09/17/2024 Family notified: Tej Murdaugh; Daughter; [phone] Transport by: [facility]   [facility] insurance authorization was approved and is valid 09/17/2024-09/21/2024. SNF and MD made aware. SNF confirmed bed availability for patient. Per MD patient ready for DC to [facility]. RN to call report prior to discharge [phone]). RN, patient, patient's family, and facility notified of DC. Discharge Summary and FL2 sent to facility. DC packet on chart. Ambulance transport requested for patient at 12:37.  CSW will sign off for now as social work intervention is no longer needed. Please consult us  again if new needs arise.    Final next level of care: Skilled Nursing Facility Barriers to Discharge: Barriers Resolved   Patient Goals and CMS Choice Patient states their goals for this hospitalization and ongoing recovery are:: DAUGHTER IS AGREEABLE TO SNF IF NEEDED CMS Medicare.gov Compare Post Acute Care list provided to:: Patient Represenative (must comment) Choice offered to / list presented to : Adult Children      Discharge Placement              Patient chooses bed at: [facility] Patient to be transferred to facility by: [facility] Name of family member notified: Aurther Harlin; Daughter; [phone] Patient and family notified of of transfer: 09/17/24  Discharge Plan and Services Additional resources added to the After Visit Summary for   In-house Referral: Clinical Social Work Discharge Planning Services: CM Consult Post Acute Care Choice: IP Rehab, Skilled Nursing Facility                               Social Drivers of Health (SDOH)  Interventions SDOH Screenings   Food Insecurity: No Food Insecurity (09/07/2024)  Housing: Low Risk  (09/07/2024)  Transportation Needs: No Transportation Needs (09/07/2024)  Utilities: Not At Risk (09/07/2024)  Alcohol Screen: Low Risk  (06/16/2024)  Depression (PHQ2-9): Low Risk  (06/16/2024)  Financial Resource Strain: Low Risk  (06/16/2024)  Physical Activity: Inactive (06/16/2024)  Social Connections: Patient Declined (09/07/2024)  Recent Concern: Social Connections - Socially Isolated (06/16/2024)  Stress: No Stress Concern Present (06/16/2024)  Tobacco Use: Medium Risk (09/07/2024)  Health Literacy: Adequate Health Literacy (06/16/2024)     Readmission Risk Interventions     No data to display

## 2024-09-17 NOTE — Discharge Instructions (Signed)
 Advised to follow-up with primary care physician in 1 week.  Advised to follow-up with cardiology as scheduled. Advised to take losartan and Coreg as prescribed. Advised to discontinue amlodipine  and hydralazine . Advised to take Macrobid twice daily for 5 days

## 2024-09-18 LAB — URINE CULTURE: Culture: 90000 — AB

## 2024-09-20 ENCOUNTER — Ambulatory Visit: Admitting: Internal Medicine

## 2024-09-20 DIAGNOSIS — J9601 Acute respiratory failure with hypoxia: Secondary | ICD-10-CM | POA: Diagnosis not present

## 2024-09-20 DIAGNOSIS — I5023 Acute on chronic systolic (congestive) heart failure: Secondary | ICD-10-CM | POA: Diagnosis not present

## 2024-09-20 DIAGNOSIS — D631 Anemia in chronic kidney disease: Secondary | ICD-10-CM | POA: Diagnosis not present

## 2024-09-20 DIAGNOSIS — M6281 Muscle weakness (generalized): Secondary | ICD-10-CM | POA: Diagnosis not present

## 2024-09-20 DIAGNOSIS — R2681 Unsteadiness on feet: Secondary | ICD-10-CM | POA: Diagnosis not present

## 2024-09-20 DIAGNOSIS — E7800 Pure hypercholesterolemia, unspecified: Secondary | ICD-10-CM | POA: Diagnosis not present

## 2024-09-20 DIAGNOSIS — I130 Hypertensive heart and chronic kidney disease with heart failure and stage 1 through stage 4 chronic kidney disease, or unspecified chronic kidney disease: Secondary | ICD-10-CM | POA: Diagnosis not present

## 2024-09-20 DIAGNOSIS — G8929 Other chronic pain: Secondary | ICD-10-CM | POA: Diagnosis not present

## 2024-09-20 DIAGNOSIS — I483 Typical atrial flutter: Secondary | ICD-10-CM | POA: Diagnosis not present

## 2024-09-20 DIAGNOSIS — G3184 Mild cognitive impairment, so stated: Secondary | ICD-10-CM | POA: Diagnosis not present

## 2024-09-20 DIAGNOSIS — N1831 Chronic kidney disease, stage 3a: Secondary | ICD-10-CM | POA: Diagnosis not present

## 2024-09-20 DIAGNOSIS — I739 Peripheral vascular disease, unspecified: Secondary | ICD-10-CM | POA: Diagnosis not present

## 2024-09-20 DIAGNOSIS — H9193 Unspecified hearing loss, bilateral: Secondary | ICD-10-CM | POA: Diagnosis not present

## 2024-09-20 DIAGNOSIS — N390 Urinary tract infection, site not specified: Secondary | ICD-10-CM | POA: Diagnosis not present

## 2024-09-20 NOTE — Progress Notes (Deleted)
 Name: Jesus Lam  MRN/ DOB: [id number], 1933/10/23    Age/ Sex: 89 y.o., male    PCP: Georgina Speaks, FNP   Reason for Endocrinology Evaluation: Subclinical hyperthyroidism     Date of Initial Endocrinology Evaluation: 08/20/2022    HPI: [name] is a 89 y.o. male with a past medical history of HTN, PVD, Hx of CVA. The patient presented for initial endocrinology clinic visit on 08/20/2022 for consultative assistance with his subclinical hyperthyroidism.   Patient has been noted with low TSH since December 2019 with a nadir of 0.018 u IU/mL, with normal T4 and T3.  Patient with normal TPO antibodies as well as undetectable TRAb  Thyroid  ultrasound 01/2019 revealed multiple thyroid  nodules  He is s/p benign FNA of the left mid 7.2 cm and right mid 2.5 cm nodules in May 2020  Methimazole  was started in 10/2018  On his initial visit to our clinic it was difficult to obtain any clear history from the patient, a call to his pharmacy confirmed that he did not pick methimazole  for approximately 4 months prior.   I will start him on methimazole  in 07/2022 but he was lost to follow-up until his return to our clinic 04/2024   SUBJECTIVE:    Today (09/20/24):  Burwell Jesus Lam is here for follow-up on hyperthyroidism.     He is accompanied by [name] Since his last visit here, the daughter contacted us  with complaints of restlessness that she attributed to methimazole .  It was explained to the daughter that the symptoms are more consistent with hyperthyroid rather than the medication itself  Patient was hospitalized for complaints of shortness of breath and lower extremity edema, patient was treated for acute CHF, was diuresed, with 14 L weight loss in October, 2025 Weight has been stable over the past year Denies palpitations  Has chronic occasional hand tremors  Denies loose stools or diarrhea  Stable neck swelling   Methimazole  10 mg daily       HISTORY:  Past Medical History:  Past Medical History:  Diagnosis Date   Anemia    BPH (benign prostatic hyperplasia)    Cataracts, bilateral    CVA (cerebral vascular accident) (HCC)    CVA (cerebrovascular accident due to intracerebral hemorrhage) (HCC) 03/22/2024   Deep venous thrombosis (DVT) of left peroneal vein (HCC) 09/24/2023   DVT (deep venous thrombosis) (HCC)    dvt in left leg   Dyslipidemia    Dysuria 06/17/2023   Gout    Gout    HTN (hypertension)    Ischemic cerebrovascular accident (CVA) (HCC) 06/13/2018   Left hip pain 03/15/2020   Left leg pain    Lower abdominal pain 11/03/2023   Osteoarthritis    PAD (peripheral artery disease)    Stasis dermatitis    Stroke (HCC)    Vitamin D  deficiency    Past Surgical History:  Past Surgical History:  Procedure Laterality Date   ABDOMINAL AORTOGRAM N/A 01/13/2018   Procedure: ABDOMINAL AORTOGRAM;  Surgeon: Elmira Newman PARAS, MD;  Location: [facility];  Service: Cardiovascular;  Laterality: N/A;   CATARACT EXTRACTION, BILATERAL     LEFT HEART CATH AND CORONARY ANGIOGRAPHY N/A 01/13/2018   Procedure: LEFT HEART CATH AND CORONARY ANGIOGRAPHY;  Surgeon: Elmira Newman PARAS, MD;  Location: [facility];  Service: Cardiovascular;  Laterality: N/A;   LOWER EXTREMITY ANGIOGRAPHY N/A 01/13/2018   Procedure: LOWER EXTREMITY ANGIOGRAPHY;  Surgeon: Elmira Newman PARAS, MD;  Location: [facility];  Service: Cardiovascular;  Laterality: N/A;   LOWER EXTREMITY ANGIOGRAPHY Right 01/27/2018   Procedure: LOWER EXTREMITY ANGIOGRAPHY;  Surgeon: Ladona Heinz, MD;  Location: [facility];  Service: Cardiovascular;  Laterality: Right;   PERIPHERAL VASCULAR ATHERECTOMY Left 01/13/2018   Procedure: PERIPHERAL VASCULAR ATHERECTOMY;  Surgeon: Elmira Newman PARAS, MD;  Location: [facility];  Service: Cardiovascular;  Laterality: Left;  SFA WITH PTA DRUG COATED BALLOON   PERIPHERAL VASCULAR INTERVENTION   01/27/2018   Procedure: PERIPHERAL VASCULAR INTERVENTION;  Surgeon: Ladona Heinz, MD;  Location: [facility];  Service: Cardiovascular;;    Social History:  reports that he quit smoking about 45 years ago. His smoking use included cigarettes. He started smoking about 46 years ago. He has a 0.1 pack-year smoking history. He has never used smokeless tobacco. He reports that he does not currently use alcohol after a past usage of about 6.0 - 7.0 standard drinks of alcohol per week. He reports that he does not currently use drugs after having used the following drugs: Marijuana. Family History: family history includes Alcohol abuse in his father; Breast cancer in his daughter; CVA in his daughter; Cancer in his father and mother; Stroke in his daughter.   HOME MEDICATIONS: Allergies as of 09/20/2024       Reactions   Porcine (pork) Protein-containing Drug Products Other (See Comments)   Religious    Shellfish Allergy Other (See Comments)   Gout         Medication List        Accurate as of September 20, 2024  7:15 AM. If you have any questions, ask your nurse or doctor.          apixaban  5 MG Tabs tablet Commonly known as: ELIQUIS  Take 1 tablet (5 mg total) by mouth 2 (two) times daily.   ascorbic acid  500 MG tablet Commonly known as: VITAMIN C  Take 500 mg daily by mouth.   atorvastatin  40 MG tablet Commonly known as: LIPITOR  Take 2 tablets (80 mg total) by mouth daily. What changed:  how much to take when to take this   azelastine  0.1 % nasal spray Commonly known as: ASTELIN  Place 2 sprays into both nostrils 2 (two) times daily. Use in each nostril as directed What changed:  when to take this reasons to take this   carvedilol 3.125 MG tablet Commonly known as: COREG Take 1 tablet (3.125 mg total) by mouth 2 (two) times daily with a meal.   COMPLETE SENIOR PO 1 tablet daily.   cyanocobalamin  500 MCG tablet Commonly known as: VITAMIN B12 Take 500 mcg by mouth  daily.   diclofenac  Sodium 1 % Gel Commonly known as: VOLTAREN  APPLY TWO GRAMS TOPICALLY FOUR TIMES A DAY What changed: See the new instructions.   furosemide  20 MG tablet Commonly known as: LASIX  TAKE ONE TABLET BY MOUTH DAILY   GNP 8 Hour Arthritis Relief 650 MG CR tablet Generic drug: acetaminophen  TAKE ONE TABLET BY MOUTH TWICE A DAY   losartan 25 MG tablet Commonly known as: COZAAR Take 1 tablet (25 mg total) by mouth daily.   methimazole  10 MG tablet Commonly known as: TAPAZOLE  Take 1 tablet (10 mg total) by mouth daily.   methocarbamol 500 MG tablet Commonly known as: ROBAXIN Take 1 tablet (500 mg total) by mouth every 8 (eight) hours as needed for up to 10 days for muscle spasms.   mirtazapine  7.5 MG tablet Commonly known as:  REMERON  TAKE 1 TABLET (7.5 MG TOTAL) BY MOUTH AT BEDTIME.   nitrofurantoin (macrocrystal-monohydrate) 100 MG capsule Commonly known as: MACROBID Take 1 capsule (100 mg total) by mouth every 12 (twelve) hours for 5 days.   tamsulosin  0.4 MG Caps capsule Commonly known as: FLOMAX  TAKE ONE CAPSULE BY MOUTH DAILY HALF HOUR FOLLOWING THE SAME MEAL DAILY What changed: See the new instructions.   Vitamin D3 125 MCG (5000 UT) Caps Take 5,000 Units by mouth daily.          REVIEW OF SYSTEMS: A comprehensive ROS was conducted with the patient and is negative except as per HPI    OBJECTIVE:  VS: There were no vitals taken for this visit.   Wt Readings from Last 3 Encounters:  09/17/24 240 lb 4.8 oz (109 kg)  08/23/24 246 lb (111.6 kg)  08/10/24 248 lb (112.5 kg)     EXAM: General: Pt appears well and is in NAD  Neck: General: Supple without adenopathy. Thyroid : Thyroid  size normal.  No goiter or nodules appreciated.   Lungs: Clear with good BS bilat  Heart: Auscultation: RRR.  Extremities:  BL LE: No pretibial edema   Mental Status: Judgment, insight: Intact Orientation: Oriented to time, place, and person Mood and affect:  No depression, anxiety, or agitation     DATA REVIEWED:   Latest Reference Range & Units 03/23/24 08:36  TSH 0.350 - 4.500 uIU/mL <0.010 (L)  T4,Free(Direct) 0.61 - 1.12 ng/dL 8.69 (H)     Latest Reference Range & Units 04/15/24 12:33  Sodium 134 - 144 mmol/L 146 (H)  Potassium 3.5 - 5.2 mmol/L 4.7  Chloride 96 - 106 mmol/L 107 (H)  CO2 20 - 29 mmol/L 23  Glucose 70 - 99 mg/dL 896 (H)  BUN 8 - 27 mg/dL 12  Creatinine 9.23 - 8.72 mg/dL 8.89  Calcium  8.6 - 10.2 mg/dL 9.6  BUN/Creatinine Ratio 10 - 24  11  eGFR >59 mL/min/1.73 64  Alkaline Phosphatase 44 - 121 IU/L 101  Albumin 3.7 - 4.7 g/dL 3.7  AST 0 - 40 IU/L 14  ALT 0 - 44 IU/L 15  Total Protein 6.0 - 8.5 g/dL 6.7  Total Bilirubin 0.0 - 1.2 mg/dL 0.5     Thyroid  ultrasound 09/03/2022   Estimated total number of nodules >/= 1 cm: 5   Number of spongiform nodules >/=  2 cm not described below (TR1): 0   Number of mixed cystic and solid nodules >/= 1.5 cm not described below (TR2): 0   _________________________________________________________   Nodule 1: 1.4 x 0.9 x 0.8 cm isoechoic heterogeneous region within the isthmus favored to be a pseudo nodule given lack of define margins.   _________________________________________________________   Nodule 2: 3.9 x 3.3 x 1.8 cm right mid thyroid  nodule does not significantly. Changed in size since 04/15/2019 where it measured 3.8 x 2.9 x 1.6 cm. Please correlate with prior FNA results from 04/15/2019. _________________________________________________________   Nodule # 3:   Location: Right; mid   Maximum size: 1.1 cm; Other 2 dimensions: 0.7 x 0.7 cm   Composition: solid/almost completely solid (2)   Echogenicity: hypoechoic (2)   Shape: not taller-than-wide (0)   Margins: ill-defined (0)   Echogenic foci: none (0)   ACR TI-RADS total points: 4.   ACR TI-RADS risk category: TR4 (4-6 points).   ACR TI-RADS recommendations:   *Given size (>/= 1 - 1.4  cm) and appearance, a follow-up ultrasound in 1 year should be considered based  on TI-RADS criteria.   _________________________________________________________   Nodule 4: Ill-defined heterogeneous region in the inferior right thyroid  lobe measuring 0.9 x 0.8 x 0.7 cm does not meet criteria for FNA or imaging follow-up. This may represent a pseudo nodule.   _________________________________________________________ ir   Nodule 5: 8.3 x 4.6 x 5.6 cm isoechoic solid nodule replacing the left thyroid  lobe is unchanged since prior examination from 04/07/2019 where it measures 7.2 x 4.5 x 4.8 cm. Please correlate with prior FNA results from 04/15/2019.   _________________________________________________________   Nodule 6: 1.1 x 1.0 x 0.7 cm spongiform nodule in the superior left thyroid  lobe does not meet criteria for imaging surveillance or FNA.   IMPRESSION: 1. Previously biopsied bilateral thyroid  nodules 2 and 5 are not significantly changed in size. Please correlate with FNA result from 04/15/2019. 2. Nodule 3 (TI-RADS 4), measuring 1.1 cm, located in the mid right thyroid  lobe is new since prior examinations and meets criteria for imaging follow-up. Annual ultrasound surveillance is recommended until 5 years of stability is documented. 3. Incidental finding of right internal jugular vein thrombus.         FNA left mid 7.2 cm nodule 04/15/2019  THYROID  FINE NEEDLE ASPIRATION LEFT LOBE (SPECIMEN 2 OF 2, COLLECTED 04/15/2019): CONSISTENT WITH BENIGN FOLLICULAR NODULE (BETHESDA CATEGORY II).   FNA right mid 2.5 cm nodule 04/15/2019   THYROID  FINE NEEDLE ASPIRATION MID RIGHT LOBE (SPECIMEN 1 OF 2, COLLECTED 04/15/2019): CONSISTENT WITH BENIGN FOLLICULAR NODULE (BETHESDA CATEGORY II).  ASSESSMENT/PLAN/RECOMMENDATIONS:   Multinodular goiter:  -No local neck symptoms -Patient is s/p benign FNA of bilateral nodules in 2020 -This is most likely the reason for his  hyperthyroidism - Thyroid  ultrasound 2023 did not showed any significant changes    2.  Hyperthyroidism  -Patient is clinically euthyroid - Most recent labs have been reviewed -Will restart methimazole  as below -Will recheck labs in 2 months    Medication Start  methimazole  10 mg, 1 tablet daily  Follow-up in 4 months  Signed electronically by: Stefano Redgie Butts, MD  [facility]  [facility] [address] [place] [place], [place] [address] Phone: [phone] FAX: [phone]   CC: Georgina Speaks, FNP [address] [place] [address] Phone: [phone] Fax: [phone]   Return to Endocrinology clinic as below: Future Appointments  Date Time Provider Department Center  09/20/2024 10:50 AM Melquan Ernsberger, Donell Redgie, MD [facility] None  09/22/2024  1:45 PM [facility] 3 [facility] H&V  09/22/2024  2:45 PM Patwardhan, Newman PARAS, MD [facility] H&V  09/23/2024 11:30 AM Rosemarie Eather RAMAN, MD [facility] None  10/18/2024 11:40 AM Georgina Speaks, FNP [facility] [id number]  12/02/2024  1:30 PM Rosemarie Eather RAMAN, MD [facility] None

## 2024-09-21 ENCOUNTER — Other Ambulatory Visit: Payer: Self-pay | Admitting: Nurse Practitioner

## 2024-09-21 DIAGNOSIS — M19042 Primary osteoarthritis, left hand: Secondary | ICD-10-CM

## 2024-09-22 ENCOUNTER — Ambulatory Visit: Attending: Cardiology | Admitting: Cardiology

## 2024-09-22 ENCOUNTER — Ambulatory Visit (HOSPITAL_COMMUNITY)

## 2024-09-22 ENCOUNTER — Encounter: Payer: Self-pay | Admitting: Nurse Practitioner

## 2024-09-22 DIAGNOSIS — R2681 Unsteadiness on feet: Secondary | ICD-10-CM | POA: Diagnosis not present

## 2024-09-22 DIAGNOSIS — M6281 Muscle weakness (generalized): Secondary | ICD-10-CM | POA: Diagnosis not present

## 2024-09-22 DIAGNOSIS — I739 Peripheral vascular disease, unspecified: Secondary | ICD-10-CM | POA: Diagnosis not present

## 2024-09-22 DIAGNOSIS — N390 Urinary tract infection, site not specified: Secondary | ICD-10-CM | POA: Diagnosis not present

## 2024-09-22 DIAGNOSIS — I5023 Acute on chronic systolic (congestive) heart failure: Secondary | ICD-10-CM | POA: Diagnosis not present

## 2024-09-22 DIAGNOSIS — G8929 Other chronic pain: Secondary | ICD-10-CM | POA: Diagnosis not present

## 2024-09-22 NOTE — Progress Notes (Deleted)
 Cardiology Office Note:  .   Date:  09/22/2024  ID:  Jesus Lam, DOB 06-11-1933, MRN [id number] PCP: Jesus Speaks, FNP  [facility] Providers Cardiologist:  Newman Lawrence, MD PCP: Jesus Speaks, FNP  No chief complaint on file.     History of Present Illness: .    Jesus Lam is a 89 y.o. male with hypertension, hyperlipidemia, PAD, h/o DVT (08/2023), h/o recurrent stroke, HFrEF, persistent atrial flutter  Patient was seen in the emergency room on 06/04/2024 with acute worsening of his postop aphasia and dysarthria.  He was evaluated by neurologist Dr. Merrianne. He felt that acute worsening of pre-existing deficits from recent left parietal frontal ischemic stroke was due to intercurrent illness, versus possible new stroke.  He recommended MRI brain, which showed no new acute intracranial abnormality.   Patient is here today with [name].  His neurodeficit is largely related to his speech.  He does not report any significant motor or sensory neurodeficit.  In fact, he wants to get back to swimming, something he enjoyed until very recently.  However, he does report exertional dyspnea with minimal activity.  He also reports retrosternal burning sensation but can last up to 2 hours at times, usually occurs at rest.  He has trace leg edema.  Patient was found to have 100% atrial flutter burden on recent monitor placed after his stroke.  He had been on Eliquis  since diagnosis of DVT in 08/2023, but had some recent concern regarding noncompliance that was documented during his stroke admission.  Regarding patient's LVEF, it was down to 25-30% at the time of his stroke in 2019, but had subsequently increased to about 45%.  Recent echocardiogram 02/2024 again shows EF of around 45%.  There were no vitals filed for this visit.     ROS:  Review of Systems  Cardiovascular:  Positive for chest pain, dyspnea on exertion and leg swelling. Negative for palpitations  and syncope.  Musculoskeletal:        Left leg pain      Studies Reviewed: SABRA       EKG 07/14/2024: Atrial flutter with variable AV conduction Left axis deviation Left ventricular hypertrophy ( R in aVL , Cornell product ) Cannot rule out Septal infarct (cited on or before 23-Mar-2024)  When compared with ECG of 23-Mar-2024 12:33, atrial flutter has replaced sinus rhythm    Zio patch monitor 14 days 04/29/2024 - 05/13/2024: Dominant rhythm: Atrial flutter HR 35-134 bpm. Avg HR 69 bpm. Atrial Flutter occurred continuously (100% burden), ranging from 35-134 bpm (avg of 69 bpm). Bundle Branch Block/IVCD was present. Isolated VEs were rare. 0 episodes of SVT. 0 episodes of VT. <1% isolated VE, couplet/triplets. No SVT/VT/high grade AV block, sinus pause >3sec noted. 0 patient triggered events.     Echocardiogram 02/2024: 1. Left ventricular ejection fraction, by estimation, is 45 to 50%. The  left ventricle has mildly decreased function. The left ventricle  demonstrates global hypokinesis. There is moderate concentric left  ventricular hypertrophy. Left ventricular  diastolic parameters are consistent with Grade I diastolic dysfunction  (impaired relaxation).   2. Right ventricular systolic function is normal. The right ventricular  size is normal. There is normal pulmonary artery systolic pressure.   3. Left atrial size was moderately dilated.   4. The mitral valve is normal in structure. Trivial mitral valve  regurgitation. No evidence of mitral stenosis.   5. The aortic valve is calcified. There is mild calcification of the  aortic  valve. There is mild thickening of the aortic valve. Aortic valve  regurgitation is mild to moderate. Mild aortic valve stenosis. Aortic  valve area, by VTI measures 1.41 cm.  Aortic valve mean gradient measures 12.0 mmHg. Aortic valve Vmax measures  2.51 m/s.   6. Aortic dilatation noted. There is mild dilatation of the ascending  aorta, measuring  41 mm.   7. The inferior vena cava is normal in size with greater than 50%  respiratory variability, suggesting right atrial pressure of 3 mmHg.     Labs 4-06/2024: Chol 94, TG 112, HDL 23, LDL 49 HbA1C 4.8% Hb 12.6 Cr 1.4, eGFR 51 TSH 0.28, free T4 1.2   Labs  09/17/2023: eGFR 59 NT proBNP 213 LDL 42, triglycerides 133 (05/2023)   Physical Exam:   Physical Exam Vitals and nursing note reviewed.  Constitutional:      General: He is not in acute distress. Neck:     Vascular: No JVD.  Cardiovascular:     Rate and Rhythm: Normal rate. Rhythm irregular.     Pulses: Decreased pulses.     Heart sounds: Normal heart sounds. No murmur heard. Pulmonary:     Effort: Pulmonary effort is normal.     Breath sounds: Normal breath sounds. No wheezing or rales.  Musculoskeletal:     Right lower leg: Edema (Trace) present.     Left lower leg: Edema (1+) present.      VISIT DIAGNOSES: No diagnosis found.     ASSESSMENT AND PLAN: .    [name] is a 89 y.o. male with hypertension, hyperlipidemia, PAD, h/o DVT (08/2023), h/o recurrent stroke, HFrEF, persistent atrial flutter, hyperthyroidism  Persistent atrial flutter: New diagnosis as of 05/2024. The patient had been on anticoagulation for DVT since 08/2023, there had been some concern regarding compliance.  Therefore, atrial flutter is highly likely to be etiology of his stroke. Currently, he is in 100% atrial flutter with some associated heart failure signs/symptoms. Hyperthyroidism could be contributing to his atrial flutter.  However, his TSH has increased from <0.010 in 02/2024 to 0.28 in 06/2024 on methimazole .  Continue rate fairly well-controlled without any beta-blocker/calcium  channel ocular this time.  His bisoprolol  was discontinued in 02/2024 due to concern regarding asymptomatic sinus pauses. Given that this is typical atrial flutter, rhythm control strategy is reasonable to consider.  However, ablation less likely  to be an attractive option at age 22.  Given his low EF, he is antiarrhythmic therapy options are likely limited to amiodarone and Tikosyn.  Moreover, given his history of hyperthyroidism, amiodarone is unlikely to be a good option.  This possibly limits him to Tikosyn if rhythm control therapy is considered.  I would recommend TEE guided cardioversion in the next few weeks to restore sinus rhythm and refer to EP for further recommendations regarding atrial flutter rhythm management. Emphasized importance of compliance with anticoagulation, Eliquis  5 mg twice daily. (Age >80 years, weight >60 EKG, creatinine 1.4.  If creatinine were to increase >1.5, he will need reduction in Eliquis  dose to 2.5 mg twice daily.  HFrEF: Suspect atrial flutter to be contributing. I would like to repeat echocardiogram after cardioversion in 08/2024.  If EF remains low, he will likely need GDMT for HFrEF. For now, continue Lasix  20 mg daily.  Retrosternal burning: CAD remains in the differential given his low EF. Will obtain Lexiscan nuclear stress test.  Hyperthyroidism: Currently on methimazole  as per endocrinologist Dr. Sam.  Hypertension: Controlled  Mixed hyperlipidemia: Well controlled  F/u in 3 months   I spent 45 minutes in the care of Gerik Coberly today including reviewing hospital records from stroke hospitalization in 02/2024, as well as subsequent ER visit in 05/2024, reviewing historical records of echocardiogram starting 2019, reviewing records regarding management of hyperthyroidism, reviewing monitor results and tracings from 05/2024, reviewing differential diagnoses for etiology of stroke as well as heart failure, heart discussing treatment options for management of atrial flutter, coordinating care, including EP referral, and documenting in the encounter.   Signed, Newman JINNY Lawrence, MD

## 2024-09-23 ENCOUNTER — Inpatient Hospital Stay: Admitting: Neurology

## 2024-09-29 DIAGNOSIS — I69322 Dysarthria following cerebral infarction: Secondary | ICD-10-CM | POA: Diagnosis not present

## 2024-09-29 DIAGNOSIS — I5023 Acute on chronic systolic (congestive) heart failure: Secondary | ICD-10-CM | POA: Diagnosis not present

## 2024-09-29 DIAGNOSIS — D631 Anemia in chronic kidney disease: Secondary | ICD-10-CM | POA: Diagnosis not present

## 2024-09-29 DIAGNOSIS — N1831 Chronic kidney disease, stage 3a: Secondary | ICD-10-CM | POA: Diagnosis not present

## 2024-09-29 DIAGNOSIS — I739 Peripheral vascular disease, unspecified: Secondary | ICD-10-CM | POA: Diagnosis not present

## 2024-09-29 DIAGNOSIS — Z7189 Other specified counseling: Secondary | ICD-10-CM | POA: Diagnosis not present

## 2024-09-29 DIAGNOSIS — R2681 Unsteadiness on feet: Secondary | ICD-10-CM | POA: Diagnosis not present

## 2024-09-29 DIAGNOSIS — G8929 Other chronic pain: Secondary | ICD-10-CM | POA: Diagnosis not present

## 2024-09-29 DIAGNOSIS — Z7901 Long term (current) use of anticoagulants: Secondary | ICD-10-CM | POA: Diagnosis not present

## 2024-09-29 DIAGNOSIS — I483 Typical atrial flutter: Secondary | ICD-10-CM | POA: Diagnosis not present

## 2024-09-29 DIAGNOSIS — I130 Hypertensive heart and chronic kidney disease with heart failure and stage 1 through stage 4 chronic kidney disease, or unspecified chronic kidney disease: Secondary | ICD-10-CM | POA: Diagnosis not present

## 2024-09-29 DIAGNOSIS — M6281 Muscle weakness (generalized): Secondary | ICD-10-CM | POA: Diagnosis not present

## 2024-10-01 DIAGNOSIS — I739 Peripheral vascular disease, unspecified: Secondary | ICD-10-CM | POA: Diagnosis not present

## 2024-10-01 DIAGNOSIS — E7800 Pure hypercholesterolemia, unspecified: Secondary | ICD-10-CM | POA: Diagnosis not present

## 2024-10-01 DIAGNOSIS — Z7901 Long term (current) use of anticoagulants: Secondary | ICD-10-CM | POA: Diagnosis not present

## 2024-10-01 DIAGNOSIS — I5023 Acute on chronic systolic (congestive) heart failure: Secondary | ICD-10-CM | POA: Diagnosis not present

## 2024-10-01 DIAGNOSIS — D631 Anemia in chronic kidney disease: Secondary | ICD-10-CM | POA: Diagnosis not present

## 2024-10-01 DIAGNOSIS — I130 Hypertensive heart and chronic kidney disease with heart failure and stage 1 through stage 4 chronic kidney disease, or unspecified chronic kidney disease: Secondary | ICD-10-CM | POA: Diagnosis not present

## 2024-10-01 DIAGNOSIS — I4891 Unspecified atrial fibrillation: Secondary | ICD-10-CM | POA: Diagnosis not present

## 2024-10-01 DIAGNOSIS — N189 Chronic kidney disease, unspecified: Secondary | ICD-10-CM | POA: Diagnosis not present

## 2024-10-04 ENCOUNTER — Telehealth: Payer: Self-pay

## 2024-10-04 NOTE — Transitions of Care (Post Inpatient/ED Visit) (Signed)
 10/04/2024  Name: Jesus Lam MRN: [id number] DOB: Mar 15, 1933  Today's TOC FU Call Status: Today's TOC FU Call Status:: Successful TOC FU Call Completed TOC FU Call Complete Date: 10/04/24 Patient's Name and Date of Birth confirmed.  Transition Care Management Follow-up Telephone Call Date of Discharge: 10/02/24 Discharge Facility: Other ([facility]) Name of Other ([facility]) Discharge Facility: [facility] Type of Discharge: Inpatient Admission Primary Inpatient Discharge Diagnosis:: CHF; Respiratory Failure How have you been since you were released from the hospital?: Better Any questions or concerns?: No  Items Reviewed: Did you receive and understand the discharge instructions provided?: Yes Medications obtained,verified, and reconciled?: Yes (Medications Reviewed) Any new allergies since your discharge?: No Dietary orders reviewed?: NA Do you have support at home?: Yes People in Home [RPT]: child(ren), adult  Medications Reviewed Today: Medications Reviewed Today     Reviewed by Lavelle Charmaine NOVAK, LPN (Licensed Practical Nurse) on 10/04/24 at 1421  Med List Status: <None>   Medication Order Taking? Sig Documenting Provider Last Dose Status Informant  apixaban  (ELIQUIS ) 5 MG TABS tablet [id number] No Take 1 tablet (5 mg total) by mouth 2 (two) times daily. Elmira Newman PARAS, MD 09/06/2024 10:00 AM Active Care Giver, Family Member, Pharmacy Records, Multiple Informants  atorvastatin  (LIPITOR ) 40 MG tablet [id number] No Take 2 tablets (80 mg total) by mouth daily.  Patient taking differently: Take 40 mg by mouth in the morning and at bedtime.   Elmira Newman PARAS, MD 09/06/2024 Active Pharmacy Records, Care Giver, Family Member, Multiple Informants  azelastine  (ASTELIN ) 0.1 % nasal spray [id number] No Place 2 sprays into both nostrils 2 (two) times daily. Use in each nostril as directed  Patient taking differently: Place 2 sprays into both nostrils 2 (two) times  daily as needed for rhinitis or allergies. Use in each nostril as directed   Georgina Speaks, FNP Unknown Active Pharmacy Records, Care Giver, Family Member, Multiple Informants  carvedilol (COREG) 3.125 MG tablet [id number]  Take 1 tablet (3.125 mg total) by mouth 2 (two) times daily with a meal. Leotis Bogus, MD  Active   Cholecalciferol  (VITAMIN D3) 5000 units CAPS [id number] No Take 5,000 Units by mouth daily. [provider] 09/06/2024 Active Pharmacy Records, Care Giver, Family Member, Multiple Informants  diclofenac  Sodium (VOLTAREN ) 1 % GEL [id number] No APPLY TWO GRAMS TOPICALLY FOUR TIMES A DAY  Patient taking differently: Apply 2 g topically 2 (two) times daily as needed.   Georgina Speaks, FNP Past Week Active Pharmacy Records, Care Giver, Family Member, Multiple Informants  furosemide  (LASIX ) 20 MG tablet [id number] No TAKE ONE TABLET BY MOUTH DAILY Georgina Speaks, FNP 09/06/2024 Active Care Giver, Family Member, Pharmacy Records, Multiple Informants  GNP 8 HOUR ARTHRITIS RELIEF 650 MG CR tablet [id number] No TAKE ONE TABLET BY MOUTH TWICE A DAY Moore, Janece, FNP 09/06/2024 Active Care Giver, Family Member, Pharmacy Records, Multiple Informants  losartan (COZAAR) 25 MG tablet [id number]  Take 1 tablet (25 mg total) by mouth daily. Leotis Bogus, MD  Active   methimazole  (TAPAZOLE ) 10 MG tablet [id number] No Take 1 tablet (10 mg total) by mouth daily. Shamleffer, Donell Cardinal, MD 09/06/2024 Active Care Giver, Family Member, Pharmacy Records, Multiple Informants  mirtazapine  (REMERON ) 7.5 MG tablet [id number] No TAKE 1 TABLET (7.5 MG TOTAL) BY MOUTH AT BEDTIME. Georgina Speaks, FNP Past Week Active Care Giver, Family Member, Pharmacy Records, Multiple Informants  Multiple Vitamins-Minerals (COMPLETE SENIOR PO) [id number] No 1 tablet daily.  [provider] 09/06/2024 Active Pharmacy Records, Care Giver,  Family Member, Multiple Informants  tamsulosin  (FLOMAX ) 0.4 MG CAPS capsule  [id number] No TAKE ONE CAPSULE BY MOUTH DAILY HALF HOUR FOLLOWING THE SAME MEAL DAILY  Patient taking differently: Take 0.8 mg by mouth daily. TAKE ONE CAPSULE BY MOUTH DAILY HALF HOUR FOLLOWING THE SAME MEAL DAILY   Georgina Speaks, FNP 09/06/2024 Active Pharmacy Records, Care Giver, Family Member, Multiple Informants  vitamin B-12 (CYANOCOBALAMIN ) 500 MCG tablet [id number] No Take 500 mcg by mouth daily. [provider] 09/06/2024 Active Pharmacy Records, Care Giver, Family Member, Multiple Informants  vitamin C  (ASCORBIC ACID ) 500 MG tablet [id number] No Take 500 mg daily by mouth. [provider] 09/06/2024 Active Pharmacy Records, Care Giver, Family Member, Multiple Informants  Med List Note Steffi Nian, CPhT 09/07/24 [phone]): Contact daughter as she manages the patients medications.             Home Care and Equipment/Supplies: Were Home Health Services Ordered?: NA Any new equipment or medical supplies ordered?: NA  Functional Questionnaire: Do you need assistance with bathing/showering or dressing?: Yes Do you need assistance with meal preparation?: Yes Do you need assistance with eating?: No Do you have difficulty maintaining continence: No Do you need assistance with getting out of bed/getting out of a chair/moving?: Yes Do you have difficulty managing or taking your medications?: Yes  Follow up appointments reviewed: PCP Follow-up appointment confirmed?: Yes Date of PCP follow-up appointment?: 10/13/24 Follow-up Provider: Speaks Georgina FNP Specialist Hospital Follow-up appointment confirmed?: NA Do you need transportation to your follow-up appointment?: No Do you understand care options if your condition(s) worsen?: Yes-patient verbalized understanding    SIGNATURE Charmaine Bloodgood, LPN [facility] Health Advisor [place] [facility] You Are. We Are. [facility] [phone].

## 2024-10-07 ENCOUNTER — Other Ambulatory Visit: Payer: Self-pay

## 2024-10-07 DIAGNOSIS — G8929 Other chronic pain: Secondary | ICD-10-CM

## 2024-10-07 MED ORDER — DICLOFENAC SODIUM 1 % EX GEL
2.0000 g | Freq: Four times a day (QID) | CUTANEOUS | 1 refills | Status: AC
Start: 2024-10-07 — End: ?

## 2024-10-11 ENCOUNTER — Telehealth: Payer: Self-pay | Admitting: Nurse Practitioner

## 2024-10-11 NOTE — Telephone Encounter (Signed)
 Copied from CRM #[id number]. Topic: Clinical - Request for Lab/Test Order >> Oct 11, 2024  2:55 PM Delon HERO wrote: Reason for CRM: Katelyn calling [facility] is calling to see if Plan of care order number [id number] was received via fax 10/07/2024 CB Fax- [phone] Phone- [phone]

## 2024-10-13 ENCOUNTER — Ambulatory Visit: Payer: Self-pay | Admitting: Nurse Practitioner

## 2024-10-13 ENCOUNTER — Encounter: Payer: Self-pay | Admitting: Nurse Practitioner

## 2024-10-13 VITALS — BP 120/60 | HR 58 | Temp 98.4°F | Ht 72.0 in | Wt 256.0 lb

## 2024-10-13 DIAGNOSIS — I130 Hypertensive heart and chronic kidney disease with heart failure and stage 1 through stage 4 chronic kidney disease, or unspecified chronic kidney disease: Secondary | ICD-10-CM | POA: Diagnosis not present

## 2024-10-13 DIAGNOSIS — I739 Peripheral vascular disease, unspecified: Secondary | ICD-10-CM

## 2024-10-13 DIAGNOSIS — N1831 Chronic kidney disease, stage 3a: Secondary | ICD-10-CM | POA: Diagnosis not present

## 2024-10-13 DIAGNOSIS — I5023 Acute on chronic systolic (congestive) heart failure: Secondary | ICD-10-CM

## 2024-10-13 DIAGNOSIS — Z87440 Personal history of urinary (tract) infections: Secondary | ICD-10-CM

## 2024-10-13 DIAGNOSIS — I10 Essential (primary) hypertension: Secondary | ICD-10-CM

## 2024-10-13 DIAGNOSIS — Z09 Encounter for follow-up examination after completed treatment for conditions other than malignant neoplasm: Secondary | ICD-10-CM | POA: Diagnosis not present

## 2024-10-13 DIAGNOSIS — Z23 Encounter for immunization: Secondary | ICD-10-CM | POA: Diagnosis not present

## 2024-10-13 DIAGNOSIS — I5022 Chronic systolic (congestive) heart failure: Secondary | ICD-10-CM

## 2024-10-13 NOTE — Progress Notes (Signed)
 [name], CMA,acting as a neurosurgeon for [name], FNP.,have documented all relevant documentation on the behalf of [name], FNP,as directed by  [name], FNP while in the presence of [name], FNP.  Subjective:  Patient ID: Jesus Lam , male    DOB: Jun 30, 1933 , 89 y.o.   MRN: [id number]  Chief Complaint  Patient presents with   Hospitalization Follow-up    Patient presents today for a hospital follow up, patient reports compliance with medications. Patient denies any chest pain, SOB, or headaches. Patient was admitted 09/06/2024 and discharged on 09/17/2024. After discharge from hospital he went to [facility] rehab.  Diagnosed with Acute on chronic systolic CHF (congestive heart failure), NYHA class 3 (HCC).  Patient reports today they are feeling fine.   Knee Pain    Patient states he is still having a lot of knee pain.     HPI  Discussed the use of AI scribe software for clinical note transcription with the patient, who gave verbal consent to proceed.  History of Present Illness Jesus Lam is an 89 year old male with acute on chronic systolic congestive heart failure who presents for follow-up after recent hospitalization.  He was hospitalized from November 13th to November 24th due to acute on chronic systolic congestive heart failure, characterized by shortness of breath and fluid buildup. During his hospital stay, he underwent diuresis and lost fourteen liters of fluid. He was discharged with a continuation of Lasix , Coreg , and losartan , but could not tolerate Entresto  due to low blood pressure. He remains on Eliquis .  Since returning home on November 8th, he feels good and has been able to 'hang out'. His legs are less swollen. He has been taking mirtazapine  at night to aid with sleep and appetite and Tylenol  arthritis twice a day. His caregiver notes difficulty sleeping in a bed, preferring a recliner, which may affect his fluid management.  Following  his discharge, he was admitted to [facility] for rehabilitation and treated for a urinary tract infection with Macrobid  twice daily for five days. He is currently receiving physical therapy, occupational therapy, and speech therapy at home. A nurse visited last week, but has not been this week. He has an upcoming echocardiogram and follow-up appointments with Dr. Cleotilde and Dr. Kent, related to his previous hospital visit for a stroke.  His caregiver has moved him into an apartment closer to her own, which allows her to assist him more easily. He lives independently in his own unit within the same complex.  Past Medical History:  Diagnosis Date   Anemia    BPH (benign prostatic hyperplasia)    Cataracts, bilateral    CVA (cerebral vascular accident) (HCC)    CVA (cerebrovascular accident due to intracerebral hemorrhage) (HCC) 03/22/2024   Deep venous thrombosis (DVT) of left peroneal vein (HCC) 09/24/2023   DVT (deep venous thrombosis) (HCC)    dvt in left leg   Dyslipidemia    Dysuria 06/17/2023   Gout    Gout    HTN (hypertension)    Ischemic cerebrovascular accident (CVA) (HCC) 06/13/2018   Left hip pain 03/15/2020   Left leg pain    Lower abdominal pain 11/03/2023   Osteoarthritis    PAD (peripheral artery disease)    Stasis dermatitis    Stroke (HCC)    Vitamin D  deficiency      Family History  Problem Relation Age of Onset   Cancer Mother    Cancer Father  Alcohol abuse Father    Breast cancer Daughter    Stroke Daughter    CVA Daughter      Current Outpatient Medications:    azelastine  (ASTELIN ) 0.1 % nasal spray, Place 2 sprays into both nostrils 2 (two) times daily. Use in each nostril as directed (Patient taking differently: Place 2 sprays into both nostrils 2 (two) times daily as needed. Use in each nostril as directed), Disp: 30 mL, Rfl: 5   Cholecalciferol  (VITAMIN D3) 5000 units CAPS, Take 5,000 Units by mouth daily., Disp: , Rfl:    diclofenac  Sodium  (VOLTAREN ) 1 % GEL, Apply 2 g topically 4 (four) times daily. APPLY TWO GRAMS TOPICALLY FOUR TIMES A DAY (Patient taking differently: Apply 2 g topically 2 (two) times daily. APPLY TWO GRAMS TOPICALLY FOUR TIMES A DAY), Disp: 100 g, Rfl: 1   methimazole  (TAPAZOLE ) 10 MG tablet, Take 1 tablet (10 mg total) by mouth daily., Disp: 30 tablet, Rfl: 6   Multiple Vitamins-Minerals (COMPLETE SENIOR PO), 1 tablet daily. , Disp: , Rfl:    tamsulosin  (FLOMAX ) 0.4 MG CAPS capsule, TAKE ONE CAPSULE BY MOUTH DAILY HALF HOUR FOLLOWING THE SAME MEAL DAILY (Patient taking differently: Take 0.4 mg by mouth daily. TAKE ONE CAPSULE BY MOUTH DAILY HALF HOUR FOLLOWING THE SAME MEAL DAILY), Disp: 90 capsule, Rfl: 4   vitamin B-12 (CYANOCOBALAMIN ) 500 MCG tablet, Take 500 mcg by mouth daily., Disp: , Rfl:    vitamin C  (ASCORBIC ACID ) 500 MG tablet, Take 500 mg daily by mouth., Disp: , Rfl:    apixaban  (ELIQUIS ) 5 MG TABS tablet, Take 1 tablet (5 mg total) by mouth 2 (two) times daily., Disp: 180 tablet, Rfl: 3   atorvastatin  (LIPITOR ) 80 MG tablet, Take 1 tablet (80 mg total) by mouth daily., Disp: 90 tablet, Rfl: 3   carvedilol  (COREG ) 3.125 MG tablet, Take 1 tablet (3.125 mg total) by mouth 2 (two) times daily with a meal., Disp: 180 tablet, Rfl: 3   furosemide  (LASIX ) 40 MG tablet, Take 1 tablet (40 mg total) by mouth daily., Disp: 90 tablet, Rfl: 3   GNP 8 HOUR ARTHRITIS RELIEF 650 MG CR tablet, TAKE ONE TABLET BY MOUTH TWICE A DAY, Disp: 60 tablet, Rfl: 0   losartan  (COZAAR ) 25 MG tablet, Take 1 tablet (25 mg total) by mouth daily., Disp: 90 tablet, Rfl: 3   mirtazapine  (REMERON ) 7.5 MG tablet, TAKE 1 TABLET (7.5 MG TOTAL) BY MOUTH AT BEDTIME., Disp: 30 tablet, Rfl: 0   Allergies  Allergen Reactions   Porcine (Pork) Protein-Containing Drug Products Other (See Comments)    Religious    Shellfish Allergy Other (See Comments)    Gout      Review of Systems   Today's Vitals   10/13/24 1528  BP: 120/60  Pulse:  (!) 58  Temp: 98.4 F (36.9 C)  TempSrc: Oral  Weight: 256 lb (116.1 kg)  Height: 6' (1.829 m)  PainSc: 7   PainLoc: Knee   Body mass index is 34.72 kg/m.  Wt Readings from Last 3 Encounters:  10/28/24 255 lb 12.8 oz (116 kg)  10/19/24 255 lb 12.8 oz (116 kg)  10/15/24 258 lb 3.2 oz (117.1 kg)    The ASCVD Risk score (Arnett DK, et al., 2019) failed to calculate for the following reasons:   The 2019 ASCVD risk score is only valid for ages 28 to 75   Risk score cannot be calculated because patient has a medical history suggesting prior/existing ASCVD  Objective:  Physical Exam Vitals and nursing note reviewed.  Constitutional:      General: He is not in acute distress.    Appearance: Normal appearance. He is obese.  Cardiovascular:     Rate and Rhythm: Normal rate and regular rhythm.     Pulses: Normal pulses.     Heart sounds: Normal heart sounds. No murmur heard. Pulmonary:     Effort: Pulmonary effort is normal. No respiratory distress.     Breath sounds: Normal breath sounds. No wheezing.  Musculoskeletal:        General: No swelling.     Comments: Using cane  Skin:    General: Skin is warm and dry.     Capillary Refill: Capillary refill takes less than 2 seconds.  Neurological:     General: No focal deficit present.     Mental Status: He is alert and oriented to person, place, and time.  Psychiatric:        Mood and Affect: Mood normal.        Behavior: Behavior normal.        Thought Content: Thought content normal.        Judgment: Judgment normal.         Assessment And Plan:   Assessment & Plan Hospital discharge follow-up TCM Performed. A member of the clinical team spoke with the patient upon dischare. Discharge summary was reviewed in full detail during the visit. Meds reconciled and compared to discharge meds. Medication list is updated and reviewed with the patient.  Greater than 50% face to face time was spent in counseling an coordination of care.   All questions were answered to the satisfaction of the patient.    Acute on chronic systolic CHF (congestive heart failure), NYHA class 3 (HCC) Recent hospitalization for volume overload, diuresis of 14 liters. Improved leg swelling and dyspnea. Entresto  not tolerated due to hypotension. - Continue Lasix , Coreg , and losartan . - Encouraged dietary salt reduction. - Advised against recliner sleeping; recommended bed with head elevation. - Discussed hospital or adjustable bed for sleeping. - Monitor fluid intake and diuretic adherence. Essential hypertension Low BP during hospitalization affected Entresto  tolerance. - Continue current antihypertensive regimen. PAD (peripheral artery disease) Continue statin Need for influenza vaccination Influenza vaccine administered Encouraged to take Tylenol  as needed for fever or muscle aches.  History of UTI  CKD stage 3a, GFR 45-59 ml/min (HCC) - baseline Scr 1.1-1.3 Requires monitoring of kidney function. - Ordered BMP to assess kidney function.  Orders Placed This Encounter  Procedures   Culture, Urine   Flu vaccine HIGH DOSE PF(Fluzone Trivalent)   CBC no Diff   BMP8+eGFR     Return for 6 month bp check.  Patient was given opportunity to ask questions. Patient verbalized understanding of the plan and was able to repeat key elements of the plan. All questions were answered to their satisfaction.    LILLETTE [name], FNP, have reviewed all documentation for this visit. The documentation on 10/31/24 for the exam, diagnosis, procedures, and orders are all accurate and complete.   IF YOU HAVE BEEN REFERRED TO A SPECIALIST, IT MAY TAKE 1-2 WEEKS TO SCHEDULE/PROCESS THE REFERRAL. IF YOU HAVE NOT HEARD FROM US /SPECIALIST IN TWO WEEKS, PLEASE GIVE US  A CALL AT [phone].

## 2024-10-14 ENCOUNTER — Ambulatory Visit (HOSPITAL_COMMUNITY)
Admission: RE | Admit: 2024-10-14 | Discharge: 2024-10-14 | Disposition: A | Discharge status: Home / Self Care | Source: Ambulatory Visit | Attending: Student in an Organized Health Care Education/Training Program | Admitting: Student in an Organized Health Care Education/Training Program

## 2024-10-14 DIAGNOSIS — I5020 Unspecified systolic (congestive) heart failure: Secondary | ICD-10-CM | POA: Insufficient documentation

## 2024-10-14 LAB — BMP8+EGFR
BUN/Creatinine Ratio: 19 (ref 10–24)
BUN: 25 mg/dL (ref 8–27)
CO2: 27 mmol/L (ref 20–29)
Calcium: 9.2 mg/dL (ref 8.6–10.2)
Chloride: 106 mmol/L (ref 96–106)
Creatinine, Ser: 1.34 mg/dL — ABNORMAL HIGH (ref 0.76–1.27)
Glucose: 87 mg/dL (ref 70–99)
Potassium: 4.4 mmol/L (ref 3.5–5.2)
Sodium: 145 mmol/L — ABNORMAL HIGH (ref 134–144)
eGFR: 51 mL/min/1.73 — ABNORMAL LOW (ref >59)

## 2024-10-14 LAB — CBC
Hematocrit: 37.6 % (ref 37.5–51.0)
Hemoglobin: 11.8 g/dL — ABNORMAL LOW (ref 13.0–17.7)
MCH: 29.4 pg (ref 26.6–33.0)
MCHC: 31.4 g/dL — ABNORMAL LOW (ref 31.5–35.7)
MCV: 94 fL (ref 79–97)
Platelets: 177 x10E3/uL (ref 150–450)
RBC: 4.01 x10E6/uL — ABNORMAL LOW (ref 4.14–5.80)
RDW: 14.1 % (ref 11.6–15.4)
WBC: 5.4 x10E3/uL (ref 3.4–10.8)

## 2024-10-14 LAB — ECHOCARDIOGRAM COMPLETE
AR max vel: 1.1 cm2
AV Area VTI: 1.1 cm2
AV Area mean vel: 1.07 cm2
AV Mean grad: 8.3 mmHg
AV Peak grad: 15.1 mmHg
Ao pk vel: 1.94 m/s
S' Lateral: 4.73 cm

## 2024-10-14 MED ORDER — PERFLUTREN LIPID MICROSPHERE
1.0000 mL | INTRAVENOUS | Status: AC | PRN
  Administered 2024-10-14: 3 mL via INTRAVENOUS

## 2024-10-15 ENCOUNTER — Encounter: Payer: Self-pay | Admitting: Cardiovascular Disease

## 2024-10-15 ENCOUNTER — Ambulatory Visit: Attending: Cardiovascular Disease | Admitting: Cardiovascular Disease

## 2024-10-15 VITALS — BP 110/62 | HR 73 | Ht 72.0 in | Wt 258.2 lb

## 2024-10-15 DIAGNOSIS — I5020 Unspecified systolic (congestive) heart failure: Secondary | ICD-10-CM

## 2024-10-15 NOTE — H&P (View-Only) (Signed)
  Electrophysiology Office Note:    Date:  10/15/2024   ID:  Jesus Lam, DOB 1932-12-11, MRN [id number]  PCP:  Georgina Speaks, FNP   [facility] Providers Cardiologist:  Newman JINNY Lawrence, MD     Referring MD: Georgina Speaks, FNP   History of Present Illness:    Jesus Lam is a 89 y.o. male with a medical history significant for atrial flutter, CHFrEF (EF 25-30%), recurrent strokes, DVT referred for rhythm management.      History of Present Illness He has a history of chronic CHF with reduced EF due to nonischemic cardiomyopathy.  He was admitted in October 2025 with acute heart failure and atrial flutter with controlled ventricular rates.  He wore a monitor and August 2025 that showed 100% burden of atrial flutter.  He is present today with his daughter [name].         Today, he reports that he feels well has no acute complaints.  EKGs/Labs/Other Studies Reviewed Today:     Echocardiogram:  TTE October 17, 2024 LVEF 25 to 30%.  Mild concentric LVH.  Normal RV size.   Moderate bilateral atrial dilation     EKG:   EKG Interpretation Date/Time:  Friday October 15 2024 12:15:21 EST Ventricular Rate:  73 PR Interval:    QRS Duration:  132 QT Interval:  432 QTC Calculation: 475 R Axis:   -41  Text Interpretation: Atrial flutter with variable A-V block Left axis deviation Left ventricular hypertrophy with QRS widening ( R in aVL , Cornell product , Romhilt-Estes ) Cannot rule out Septal infarct , age undetermined When compared with ECG of 06-Sep-2024 13:37, No significant change was found Confirmed by Nancey Scotts [phone]) on 10/15/2024 12:21:48 PM     Physical Exam:    VS:  BP 110/62   Pulse 73   Ht 6' (1.829 m)   Wt 258 lb 3.2 oz (117.1 kg)   SpO2 91%   BMI 35.02 kg/m     Wt Readings from Last 3 Encounters:  10/15/24 258 lb 3.2 oz (117.1 kg)  10/13/24 256 lb (116.1 kg)  09/17/24 240 lb 4.8 oz (109 kg)     GEN: Well nourished,  well developed in no acute distress CARDIAC: RRR, no murmurs, rubs, gallops RESPIRATORY:  Normal work of breathing MUSCULOSKELETAL: no edema    ASSESSMENT & PLAN:     Atrial flutter Controlled ventricular rates Exacerbating heart failure We discussed the indication for ablation.  I explained the logistics of the procedure and risks which include but are not limited to bleeding at the access site, and note very low but not 0 risk of heart perforation, need for cardiac surgery, need for prolonged hospitalization, stroke heart attack or death.  Given his advanced age, the procedure would have some increased risk. After discussing options, we decided to proceed with a DC cardioversion to assess whether he he has significant symptom improvement after cardioversion.  Chronic CHFrEF Continue carvedilol  3.125, losartan  25 mg I would not consider defibrillator due to his advanced age.  Secondary hypercoagulable state Continue apixaban  5 mg twice daily   Signed, Scotts FORBES Nancey, MD  10/15/2024 12:31 PM    [facility]

## 2024-10-15 NOTE — Progress Notes (Signed)
 Heart function remains low. He is seeing Dr. Mealor today for ablation discussion. In addition, need to upitrate GDMT for HFrEF. Please schedule f/u w/either me or pharmD for HFrEF management in next 2-3 weeks.  Thanks MJP

## 2024-10-15 NOTE — Progress Notes (Signed)
 Called patient, daughter answered, informed her about patient echo results. And informed her that patient needs to schedule a f/u with Dr. Elmira she verbalized understanding. All questions (if any) were answered.    Patient has been schedule for 10/28/24 @ 1:45 for HFrEF

## 2024-10-15 NOTE — Progress Notes (Signed)
  Electrophysiology Office Note:    Date:  10/15/2024   ID:  Jesus Lam, DOB 1932-12-11, MRN [id number]  PCP:  Jesus Speaks, FNP   [facility] Providers Cardiologist:  Jesus JINNY Lawrence, MD     Referring MD: Jesus Speaks, FNP   History of Present Illness:    Jesus Lam is a 89 y.o. male with a medical history significant for atrial flutter, CHFrEF (EF 25-30%), recurrent strokes, DVT referred for rhythm management.      History of Present Illness He has a history of chronic CHF with reduced EF due to nonischemic cardiomyopathy.  He was admitted in October 2025 with acute heart failure and atrial flutter with controlled ventricular rates.  He wore a monitor and August 2025 that showed 100% burden of atrial flutter.  He is present today with [name].         Today, he reports that he feels well has no acute complaints.  EKGs/Labs/Other Studies Reviewed Today:     Echocardiogram:  TTE October 17, 2024 LVEF 25 to 30%.  Mild concentric LVH.  Normal RV size.   Moderate bilateral atrial dilation     EKG:   EKG Interpretation Date/Time:  Friday October 15 2024 12:15:21 EST Ventricular Rate:  73 PR Interval:    QRS Duration:  132 QT Interval:  432 QTC Calculation: 475 R Axis:   -41  Text Interpretation: Atrial flutter with variable A-V block Left axis deviation Left ventricular hypertrophy with QRS widening ( R in aVL , Cornell product , Romhilt-Estes ) Cannot rule out Septal infarct , age undetermined When compared with ECG of 06-Sep-2024 13:37, No significant change was found Confirmed by Jesus Lam [phone] on 10/15/2024 12:21:48 PM     Physical Exam:    VS:  BP 110/62   Pulse 73   Ht 6' (1.829 m)   Wt 258 lb 3.2 oz (117.1 kg)   SpO2 91%   BMI 35.02 kg/m     Wt Readings from Last 3 Encounters:  10/15/24 258 lb 3.2 oz (117.1 kg)  10/13/24 256 lb (116.1 kg)  09/17/24 240 lb 4.8 oz (109 kg)     GEN: Well nourished,  well developed in no acute distress CARDIAC: RRR, no murmurs, rubs, gallops RESPIRATORY:  Normal work of breathing MUSCULOSKELETAL: no edema    ASSESSMENT & PLAN:     Atrial flutter Controlled ventricular rates Exacerbating heart failure We discussed the indication for ablation.  I explained the logistics of the procedure and risks which include but are not limited to bleeding at the access site, and note very low but not 0 risk of heart perforation, need for cardiac surgery, need for prolonged hospitalization, stroke heart attack or death.  Given his advanced age, the procedure would have some increased risk. After discussing options, we decided to proceed with a DC cardioversion to assess whether he he has significant symptom improvement after cardioversion.  Chronic CHFrEF Continue carvedilol  3.125, losartan  25 mg I would not consider defibrillator due to his advanced age.  Secondary hypercoagulable state Continue apixaban  5 mg twice daily   Signed, Lam FORBES Nancey, MD  10/15/2024 12:31 PM    [facility]

## 2024-10-15 NOTE — Patient Instructions (Signed)
 Medication Instructions:  Your physician recommends that you continue on your current medications as directed. Please refer to the Current Medication list given to you today.  *If you need a refill on your cardiac medications before your next appointment, please call your pharmacy*  Lab Work: None ordered.  If you have labs (blood work) drawn today and your tests are completely normal, you will receive your results only by: MyChart Message (if you have MyChart) OR A paper copy in the mail If you have any lab test that is abnormal or we need to change your treatment, we will call you to review the results.  Testing/Procedures: Your physician has recommended that you have a Cardioversion (DCCV). Electrical Cardioversion uses a jolt of electricity to your heart either through paddles or wired patches attached to your chest. This is a controlled, usually prescheduled, procedure. Defibrillation is done under light anesthesia in the hospital, and you usually go home the day of the procedure. This is done to get your heart back into a normal rhythm. You are not awake for the procedure. Please see the instruction sheet given to you today.   Follow-Up: At [facility], you and your health needs are our priority.  As part of our continuing mission to provide you with exceptional heart care, our providers are all part of one team.  This team includes your primary Cardiologist (physician) and Advanced Practice Providers or APPs (Physician Assistants and Nurse Practitioners) who all work together to provide you with the care you need, when you need it.  Your next appointment:   2 months with Dr Nancey  Other Instructions    Dear Jesus Lam  You are scheduled for a Cardioversion on Monday, December 8 with Dr. Shlomo.  Please arrive at the [facility] (Main Entrance A) at [facility]: [address] [place], [place] [address] at 9:00 AM (This time is 2 hour(s) before your procedure  to ensure your preparation).   Free valet parking service is available. You will check in at ADMITTING.   *Please Note: You will receive a call the day before your procedure to confirm the appointment time. That time may have changed from the original time based on the schedule for that day.*   DIET:  Nothing to eat or drink after midnight except a sip of water with medications (see medication instructions below)  MEDICATION INSTRUCTIONS: !!IF ANY NEW MEDICATIONS ARE STARTED AFTER TODAY, PLEASE NOTIFY YOUR PROVIDER AS SOON AS POSSIBLE!!   Do not take Furosemide  the morning of your procedures.  Continue taking your anticoagulant (blood thinner): Apixaban  (Eliquis ).  You will need to continue this after your procedure until you are told by your provider that it is safe to stop.   You may take your other morning medications not listed above with enough water to get them down safely.   LABS: Labs are completed  FYI:  For your safety, and to allow us  to monitor your vital signs accurately during the surgery/procedure we request: If you have artificial nails, gel coating, SNS etc, please have those removed prior to your surgery/procedure. Not having the nail coverings /polish removed may result in cancellation or delay of your surgery/procedure.  Your support person will be asked to wait in the waiting room during your procedure.  It is OK to have someone drop you off and come back when you are ready to be discharged.  You cannot drive after the procedure and will need someone to drive you  home.  Bring your insurance cards.  *Special Note: Every effort is made to have your procedure done on time. Occasionally there are emergencies that occur at the hospital that may cause delays. Please be patient if a delay does occur.

## 2024-10-18 ENCOUNTER — Ambulatory Visit: Admitting: Nurse Practitioner

## 2024-10-18 ENCOUNTER — Other Ambulatory Visit: Payer: Self-pay

## 2024-10-18 ENCOUNTER — Ambulatory Visit: Payer: Self-pay | Admitting: Nurse Practitioner

## 2024-10-18 DIAGNOSIS — N390 Urinary tract infection, site not specified: Secondary | ICD-10-CM

## 2024-10-18 LAB — URINE CULTURE

## 2024-10-19 ENCOUNTER — Ambulatory Visit: Admitting: Neurology

## 2024-10-19 ENCOUNTER — Other Ambulatory Visit: Payer: Self-pay

## 2024-10-19 ENCOUNTER — Encounter: Payer: Self-pay | Admitting: Neurology

## 2024-10-19 ENCOUNTER — Other Ambulatory Visit: Payer: Self-pay | Admitting: Nurse Practitioner

## 2024-10-19 VITALS — BP 128/74 | HR 53 | Ht 72.0 in | Wt 255.8 lb

## 2024-10-19 DIAGNOSIS — R269 Unspecified abnormalities of gait and mobility: Secondary | ICD-10-CM | POA: Diagnosis not present

## 2024-10-19 DIAGNOSIS — I69320 Aphasia following cerebral infarction: Secondary | ICD-10-CM | POA: Diagnosis not present

## 2024-10-19 DIAGNOSIS — G4709 Other insomnia: Secondary | ICD-10-CM

## 2024-10-19 DIAGNOSIS — E0590 Thyrotoxicosis, unspecified without thyrotoxic crisis or storm: Secondary | ICD-10-CM

## 2024-10-19 DIAGNOSIS — I69398 Other sequelae of cerebral infarction: Secondary | ICD-10-CM | POA: Diagnosis not present

## 2024-10-19 DIAGNOSIS — Z8673 Personal history of transient ischemic attack (TIA), and cerebral infarction without residual deficits: Secondary | ICD-10-CM

## 2024-10-19 DIAGNOSIS — I483 Typical atrial flutter: Secondary | ICD-10-CM

## 2024-10-19 DIAGNOSIS — M19041 Primary osteoarthritis, right hand: Secondary | ICD-10-CM

## 2024-10-19 NOTE — Patient Instructions (Signed)
 I had a long d/w patient daughter and granddaughter about his recent stroke, and poststroke residual aphasia and gait difficulty, risk for recurrent stroke/TIAs, personally independently reviewed imaging studies and stroke evaluation results and answered questions.Continue Eliquis  (apixaban ) daily  for secondary stroke prevention and maintain strict control of hypertension with blood pressure goal below 130/90, diabetes with hemoglobin A1c goal below 6.5% and lipids with LDL cholesterol goal below 70 mg/dL. I also advised the patient to eat a healthy diet with plenty of whole grains, cereals, fruits and vegetables, exercise regularly and maintain ideal body weight .he was advised to use his walker at all times and we discussed fall safety precautions.  Followup in the future with nurse practitioner in 1 year or call earlier if necessary.  Fall Prevention in the Home, Adult Falls can cause injuries and affect people of all ages. There are many simple things that you can do to make your home safe and to help prevent falls. If you need it, ask for help making these changes. What actions can I take to prevent falls? General information Use good lighting in all rooms. Make sure to: Replace any light bulbs that burn out. Turn on lights if it is dark and use night-lights. Keep items that you use often in easy-to-reach places. Lower the shelves around your home if needed. Move furniture so that there are clear paths around it. Do not keep throw rugs or other things on the floor that can make you trip. If any of your floors are uneven, fix them. Add color or contrast paint or tape to clearly mark and help you see: Grab bars or handrails. First and last steps of staircases. Where the edge of each step is. If you use a ladder or stepladder: Make sure that it is fully opened. Do not climb a closed ladder. Make sure the sides of the ladder are locked in place. Have someone hold the ladder while you use  it. Know where your pets are as you move through your home. What can I do in the bathroom?     Keep the floor dry. Clean up any water that is on the floor right away. Remove soap buildup in the bathtub or shower. Buildup makes bathtubs and showers slippery. Use non-skid mats or decals on the floor of the bathtub or shower. Attach bath mats securely with double-sided, non-slip rug tape. If you need to sit down while you are in the shower, use a non-slip stool. Install grab bars by the toilet and in the bathtub and shower. Do not use towel bars as grab bars. What can I do in the bedroom? Make sure that you have a light by your bed that is easy to reach. Do not use any sheets or blankets on your bed that hang to the floor. Have a firm bench or chair with side arms that you can use for support when you get dressed. What can I do in the kitchen? Clean up any spills right away. If you need to reach something above you, use a sturdy step stool that has a grab bar. Keep electrical cables out of the way. Do not use floor polish or wax that makes floors slippery. What can I do with my stairs? Do not leave anything on the stairs. Make sure that you have a light switch at the top and the bottom of the stairs. Have them installed if you do not have them. Make sure that there are handrails on both sides of  the stairs. Fix handrails that are broken or loose. Make sure that handrails are as long as the staircases. Install non-slip stair treads on all stairs in your home if they do not have carpet. Avoid having throw rugs at the top or bottom of stairs, or secure the rugs with carpet tape to prevent them from moving. Choose a carpet design that does not hide the edge of steps on the stairs. Make sure that carpet is firmly attached to the stairs. Fix any carpet that is loose or worn. What can I do on the outside of my home? Use bright outdoor lighting. Repair the edges of walkways and driveways and fix  any cracks. Clear paths of anything that can make you trip, such as tools or rocks. Add color or contrast paint or tape to clearly mark and help you see high doorway thresholds. Trim any bushes or trees on the main path into your home. Check that handrails are securely fastened and in good repair. Both sides of all steps should have handrails. Install guardrails along the edges of any raised decks or porches. Have leaves, snow, and ice cleared regularly. Use sand, salt, or ice melt on walkways during winter months if you live where there is ice and snow. In the garage, clean up any spills right away, including grease or oil spills. What other actions can I take? Review your medicines with your health care provider. Some medicines can make you confused or feel dizzy. This can increase your chance of falling. Wear closed-toe shoes that fit well and support your feet. Wear shoes that have rubber soles and low heels. Use a cane, walker, scooter, or crutches that help you move around if needed. Talk with your provider about other ways that you can decrease your risk of falls. This may include seeing a physical therapist to learn to do exercises to improve movement and strength. Where to find more information Centers for Disease Control and Prevention, STEADI: tonerpromos.no [employer] on Aging: baseringtones.pl National Institute on Aging: baseringtones.pl Contact a health care provider if: You are afraid of falling at home. You feel weak, drowsy, or dizzy at home. You fall at home. Get help right away if you: Lose consciousness or have trouble moving after a fall. Have a fall that causes a head injury. These symptoms may be an emergency. Get help right away. Call 911. Do not wait to see if the symptoms will go away. Do not drive yourself to the hospital. This information is not intended to replace advice given to you by your health care provider. Make sure you discuss any questions you have with your  health care provider. Document Revised: 07/15/2022 Document Reviewed: 07/15/2022 Elsevier Patient Education  2024 [employer].

## 2024-10-19 NOTE — Progress Notes (Signed)
 [facility] [address] [place]. [place] [address] [phone]       OFFICE FOLLOW-UP NOTE  [name] Date of Birth:  [birth year] Medical Record Number:  [id number]   HPI:  Update 10/19/2024 : He returns for follow-up after last visit nurse practitioner 5 months ago.  He is accompanied by his daughter.  Patient was seen in the ER for acute worsening of his postop aphasia and right facial droop on 06/04/2024.  He saw Dr. Lindzen on consultation.  NIH stroke scale was 6 with expressive aphasia and mild dysarthria and right-sided sensory deficits.  CT head showed no acute abnormality CT angiogram was negative for large vessel occlusion CT perfusion study was negative.  MRI brain showed no acute infarct.  Old chronic posterior left MCA distribution infarct and remote age right cerebral infarct mild changes of cerebral atrophy and small vessel disease are noted.  Neurological worsening of transient left-sided due to acute medical derangement.  His neurological deficits return back to baseline in 1 week.  He was readmitted from 09/06/2024 to 09/17/2024 for acute on chronic systolic heart failure and respiratory failure with hypoxia.  He is currently doing well at home.  He still has mild expressive aphasia but his comprehension is good.  He is able to ambulate with a walker.  He has had no recent falls.  His blood pressure is under good control today it is 128/74. He saw electrophysiologist Dr. Nancey on 11/21 for 25 and is planning on getting cardiac ablation for his A-flutter/A-fib soon.  He remains on Eliquis  which is tolerating well with minor bruising and no bleeding.  His blood pressure is under good control today it is 128/74.  He is tolerating Lipitor  well without muscle aches and pains. Prior office note 04/26/2024 Young, NP) : [name] is a 89 y.o. male with history of DVT on Eliquis , hypertension, hyperlipidemia, PAD, dementia, and hx of stroke 2019 who  presented to ED on 03/22/2024 with aphasia.  Stroke workup revealed left MCA moderate infarct as well as small right cerebellar infarct, embolic pattern, likely due to missing dose of Eliquis . CTA head/neck unremarkable. EF 45-50%. LDL 49, A1c 4.8.  Recommended continuation of Eliquis  5 mg twice daily and atorvastatin  80 mg daily, family plans on monitoring/assisting with medications.  History of cardiomyopathy with improvement of EF noted on inpatient echo and sinus bradycardia with sinus pauses, medication adjustments made and advised outpatient cardiology follow-up with plans on pursuing cardiac monitor.  He was discharged to SNF for ongoing therapy needs.   Today, 04/26/2024, patient is being seen for hospital follow-up accompanied by his daughter.  Reports continued speech difficulty, right-sided weakness and gait impairment.  Returned back home from SNF about 2 weeks ago, was just seen by home health nurse last week and is trying to get therapies set up.  Lives in his own home but has supportive daughter who assist with ADLs and has aide assistance during the weekdays.  Ambulates with rolling walker outside of home and cane indoors, no recent falls. No new stroke/TIA symptoms. Remains on Eliquis  and atorvastatin , no side effects. Routinely follows with PCP for stroke risk factor management. Is scheduled with cardiology this week and endocrinology at end of the month. No further questions or concerns at this time.    ROS:   14 system review of systems is positive for speech difficulties, weakness, palpitations, shortness of breath all other systems negative  PMH:  Past Medical History:  Diagnosis Date  Anemia    BPH (benign prostatic hyperplasia)    Cataracts, bilateral    CVA (cerebral vascular accident) (HCC)    CVA (cerebrovascular accident due to intracerebral hemorrhage) (HCC) 03/22/2024   Deep venous thrombosis (DVT) of left peroneal vein (HCC) 09/24/2023   DVT (deep venous thrombosis) (HCC)     dvt in left leg   Dyslipidemia    Dysuria 06/17/2023   Gout    Gout    HTN (hypertension)    Ischemic cerebrovascular accident (CVA) (HCC) 06/13/2018   Left hip pain 03/15/2020   Left leg pain    Lower abdominal pain 11/03/2023   Osteoarthritis    PAD (peripheral artery disease)    Stasis dermatitis    Stroke (HCC)    Vitamin D  deficiency     Social History:  Social History   Socioeconomic History   Marital status: Single    Spouse name: Not on file   Number of children: 3   Years of education: Not on file   Highest education level: Not on file  Occupational History   Occupation: retired  Tobacco Use   Smoking status: Former    Current packs/day: 0.00    Average packs/day: 0.3 packs/day for 0.5 years (0.1 ttl pk-yrs)    Types: Cigarettes    Start date: 05/1978    Quit date: 1980    Years since quitting: 45.9   Smokeless tobacco: Never  Vaping Use   Vaping status: Never Used  Substance and Sexual Activity   Alcohol use: Not Currently    Alcohol/week: 6.0 - 7.0 standard drinks of alcohol    Types: 6 - 7 Shots of liquor per week    Comment: on occasion   Drug use: Not Currently    Types: Marijuana    Comment: last used x 2 hours ago   Sexual activity: Yes  Other Topics Concern   Not on file  Social History Narrative   ** Merged History Encounter **       Lives with daughter, [name]. 2 daughters live here in [place]. Son lives in [place]. 3/16- offered information on local food pantries as well as congregate meal sites. Information declined.   Social Drivers of Corporate Investment Banker Strain: Low Risk  (06/16/2024)   Overall Financial Resource Strain (CARDIA)    Difficulty of Paying Living Expenses: Not hard at all  Food Insecurity: No Food Insecurity (09/07/2024)   Hunger Vital Sign    Worried About Running Out of Food in the Last Year: Never true    Ran Out of Food in the Last Year: Never true  Transportation Needs: No Transportation Needs  (09/07/2024)   PRAPARE - Administrator, Civil Service (Medical): No    Lack of Transportation (Non-Medical): No  Physical Activity: Inactive (06/16/2024)   Exercise Vital Sign    Days of Exercise per Week: 0 days    Minutes of Exercise per Session: 0 min  Stress: No Stress Concern Present (06/16/2024)   [employer] of Occupational Health - Occupational Stress Questionnaire    Feeling of Stress: Not at all  Social Connections: Patient Declined (09/07/2024)   Social Connection and Isolation Panel    Frequency of Communication with Friends and Family: Patient declined    Frequency of Social Gatherings with Friends and Family: Patient declined    Attends Religious Services: Patient declined    Database Administrator or Organizations: Patient declined    Attends Banker Meetings: Patient  declined    Marital Status: Patient declined  Recent Concern: Social Connections - Socially Isolated (06/16/2024)   Social Connection and Isolation Panel    Frequency of Communication with Friends and Family: More than three times a week    Frequency of Social Gatherings with Friends and Family: More than three times a week    Attends Religious Services: Never    Database Administrator or Organizations: No    Attends Banker Meetings: Never    Marital Status: Widowed  Intimate Partner Violence: Not At Risk (09/07/2024)   Humiliation, Afraid, Rape, and Kick questionnaire    Fear of Current or Ex-Partner: No    Emotionally Abused: No    Physically Abused: No    Sexually Abused: No    Medications:   Current Outpatient Medications on File Prior to Visit  Medication Sig Dispense Refill   apixaban  (ELIQUIS ) 5 MG TABS tablet Take 1 tablet (5 mg total) by mouth 2 (two) times daily. 60 tablet 5   atorvastatin  (LIPITOR ) 40 MG tablet Take 2 tablets (80 mg total) by mouth daily. (Patient taking differently: Take 40 mg by mouth in the morning and at bedtime.) 180 tablet 1    azelastine  (ASTELIN ) 0.1 % nasal spray Place 2 sprays into both nostrils 2 (two) times daily. Use in each nostril as directed (Patient taking differently: Place 2 sprays into both nostrils 2 (two) times daily as needed for rhinitis or allergies. Use in each nostril as directed) 30 mL 5   carvedilol  (COREG ) 3.125 MG tablet Take 1 tablet (3.125 mg total) by mouth 2 (two) times daily with a meal. 60 tablet 0   Cholecalciferol  (VITAMIN D3) 5000 units CAPS Take 5,000 Units by mouth daily.     diclofenac  Sodium (VOLTAREN ) 1 % GEL Apply 2 g topically 4 (four) times daily. APPLY TWO GRAMS TOPICALLY FOUR TIMES A DAY 100 g 1   furosemide  (LASIX ) 20 MG tablet TAKE ONE TABLET BY MOUTH DAILY 90 tablet 1   GNP 8 HOUR ARTHRITIS RELIEF 650 MG CR tablet TAKE ONE TABLET BY MOUTH TWICE A DAY 60 tablet 0   losartan  (COZAAR ) 25 MG tablet Take 1 tablet (25 mg total) by mouth daily. 30 tablet 0   methimazole  (TAPAZOLE ) 10 MG tablet Take 1 tablet (10 mg total) by mouth daily. 30 tablet 6   mirtazapine  (REMERON ) 7.5 MG tablet TAKE 1 TABLET (7.5 MG TOTAL) BY MOUTH AT BEDTIME. 30 tablet 1   Multiple Vitamins-Minerals (COMPLETE SENIOR PO) 1 tablet daily.      tamsulosin  (FLOMAX ) 0.4 MG CAPS capsule TAKE ONE CAPSULE BY MOUTH DAILY HALF HOUR FOLLOWING THE SAME MEAL DAILY (Patient taking differently: Take 0.8 mg by mouth daily. TAKE ONE CAPSULE BY MOUTH DAILY HALF HOUR FOLLOWING THE SAME MEAL DAILY) 90 capsule 4   vitamin B-12 (CYANOCOBALAMIN ) 500 MCG tablet Take 500 mcg by mouth daily.     vitamin C  (ASCORBIC ACID ) 500 MG tablet Take 500 mg daily by mouth.     No current facility-administered medications on file prior to visit.    Allergies:   Allergies  Allergen Reactions   Porcine (Pork) Protein-Containing Drug Products Other (See Comments)    Religious    Shellfish Allergy Other (See Comments)    Gout     Physical Exam General: well developed, well nourished, seated, in no evident distress Head: head  normocephalic and atraumatic.  Neck: supple with no carotid or supraclavicular bruits Cardiovascular: regular rate and rhythm, no  murmurs Musculoskeletal: no deformity Skin:  no rash/petichiae Vascular:  Normal pulses all extremities Vitals:   10/19/24 1113  BP: 128/74  Pulse: (!) 53  SpO2: 98%   Neurologic Exam Mental Status: Awake and fully alert.  Cognitive testing limited by aphasia.  Mood and affect appropriate.  Moderate expressive aphasia.  Speaks a few words only occasional short sentences.  Mild dysarthria.  Good comprehension. Cranial Nerves: Fundoscopic exam reveals sharp disc margins. Pupils equal, briskly reactive to light. Extraocular movements full without nystagmus. Visual fields full to confrontation. Hearing intact. Facial sensation intact.  Lower facial weakness.  Tongue, palate moves normally and symmetrically.  Motor: Normal bulk and tone. Normal strength in all tested extremity muscles.  Mild right grip weakness.  Diminished fine finger movements on the right.  Reflexive right upper extremity.  Increased tone on the right. Sensory.: intact to touch ,pinprick .position and vibratory sensation.  Coordination: Rapid alternating movements normal in all extremities. Finger-to-nose and heel-to-shin performed accurately bilaterally. Gait and Station: Arises from chair without difficulty. Stance is normal. Gait demonstrates normal stride length and balance . Able to heel, toe and tandem walk without difficulty.  Reflexes: 1+ and symmetric. Toes downgoing.   NIHSS  6 Modified Rankin  3   ASSESSMENT: 89 year old African-American male with left MCA branch and right cerebellar infarct in April 2025 of cardioembolic etiology from atrial fibrillation/flutter.  He has still significant moderate expressive aphasia and mild gait difficulty.  Vascular risk factors cardiomyopathy, atrial fibrillation, DVT, hypertension, hyperlipidemia, prior stroke and advanced age.   PLAN: I had a  long d/w patient daughter and granddaughter about his recent stroke, and poststroke residual aphasia and gait difficulty, risk for recurrent stroke/TIAs, personally independently reviewed imaging studies and stroke evaluation results and answered questions.Continue Eliquis  (apixaban ) daily  for secondary stroke prevention and maintain strict control of hypertension with blood pressure goal below 130/90, diabetes with hemoglobin A1c goal below 6.5% and lipids with LDL cholesterol goal below 70 mg/dL. I also advised the patient to eat a healthy diet with plenty of whole grains, cereals, fruits and vegetables, exercise regularly and maintain ideal body weight .he was advised to use his walker at all times and we discussed fall safety precautions.  Followup in the future with nurse practitioner in 1 year or call earlier if necessary.     I personally spent a total of 50 minutes in the care of the patient today including getting/reviewing separately obtained history, performing a medically appropriate exam/evaluation, counseling and educating, placing orders, referring and communicating with other health care professionals, documenting clinical information in the EHR, independently interpreting results, and coordinating care.     Eather Popp, MD   Note: This document was prepared with digital dictation and possible smart phrase technology. Any transcriptional errors that result from this process are unintentional

## 2024-10-23 DIAGNOSIS — E0500 Thyrotoxicosis with diffuse goiter without thyrotoxic crisis or storm: Secondary | ICD-10-CM | POA: Diagnosis not present

## 2024-10-23 DIAGNOSIS — E785 Hyperlipidemia, unspecified: Secondary | ICD-10-CM | POA: Diagnosis not present

## 2024-10-23 DIAGNOSIS — Z5982 Transportation insecurity: Secondary | ICD-10-CM | POA: Diagnosis not present

## 2024-10-23 DIAGNOSIS — N3941 Urge incontinence: Secondary | ICD-10-CM | POA: Diagnosis not present

## 2024-10-23 DIAGNOSIS — D6869 Other thrombophilia: Secondary | ICD-10-CM | POA: Diagnosis not present

## 2024-10-23 DIAGNOSIS — Z9989 Dependence on other enabling machines and devices: Secondary | ICD-10-CM | POA: Diagnosis not present

## 2024-10-23 DIAGNOSIS — I4891 Unspecified atrial fibrillation: Secondary | ICD-10-CM | POA: Diagnosis not present

## 2024-10-23 DIAGNOSIS — E669 Obesity, unspecified: Secondary | ICD-10-CM | POA: Diagnosis not present

## 2024-10-23 DIAGNOSIS — M62838 Other muscle spasm: Secondary | ICD-10-CM | POA: Diagnosis not present

## 2024-10-23 DIAGNOSIS — I252 Old myocardial infarction: Secondary | ICD-10-CM | POA: Diagnosis not present

## 2024-10-23 DIAGNOSIS — I69351 Hemiplegia and hemiparesis following cerebral infarction affecting right dominant side: Secondary | ICD-10-CM | POA: Diagnosis not present

## 2024-10-23 DIAGNOSIS — N1831 Chronic kidney disease, stage 3a: Secondary | ICD-10-CM | POA: Diagnosis not present

## 2024-10-23 DIAGNOSIS — F325 Major depressive disorder, single episode, in full remission: Secondary | ICD-10-CM | POA: Diagnosis not present

## 2024-10-23 DIAGNOSIS — I2510 Atherosclerotic heart disease of native coronary artery without angina pectoris: Secondary | ICD-10-CM | POA: Diagnosis not present

## 2024-10-23 DIAGNOSIS — I130 Hypertensive heart and chronic kidney disease with heart failure and stage 1 through stage 4 chronic kidney disease, or unspecified chronic kidney disease: Secondary | ICD-10-CM | POA: Diagnosis not present

## 2024-10-23 DIAGNOSIS — I509 Heart failure, unspecified: Secondary | ICD-10-CM | POA: Diagnosis not present

## 2024-10-23 DIAGNOSIS — Z6832 Body mass index (BMI) 32.0-32.9, adult: Secondary | ICD-10-CM | POA: Diagnosis not present

## 2024-10-25 ENCOUNTER — Telehealth: Payer: Self-pay

## 2024-10-25 NOTE — Telephone Encounter (Signed)
 Copied from CRM #[id number]. Topic: Clinical - Home Health Verbal Orders >> Oct 15, 2024  4:56 PM Leonette SQUIBB wrote: Caller/Agency: Dava  [facility]  Callback Number: [phone]  Service Requested: pt;s weight is over weight that was set for him  There needs to be an order to change the weight

## 2024-10-25 NOTE — Telephone Encounter (Signed)
 I believe the forms were signed, @Ciera  please checkt to see these have been faxed

## 2024-10-26 ENCOUNTER — Telehealth: Payer: Self-pay

## 2024-10-26 NOTE — Telephone Encounter (Signed)
 Copied from CRM #[id number]. Topic: General - Call Back - No Documentation >> Oct 25, 2024  3:27 PM Lauren C wrote: Reason for CRM: Dava, an occupational therapist with [employer], is returning a call to [name]. She said she just needed patients most recent weights. I provided 11/25 weigh in number for her. She says that they will only cover a bariatric seat for patients over 300 lbs.

## 2024-10-26 NOTE — Telephone Encounter (Signed)
 Has he contacted his cardiologist in reference to his weight gain?

## 2024-10-27 ENCOUNTER — Other Ambulatory Visit (INDEPENDENT_AMBULATORY_CARE_PROVIDER_SITE_OTHER): Admitting: Nurse Practitioner

## 2024-10-27 DIAGNOSIS — I130 Hypertensive heart and chronic kidney disease with heart failure and stage 1 through stage 4 chronic kidney disease, or unspecified chronic kidney disease: Secondary | ICD-10-CM | POA: Diagnosis not present

## 2024-10-27 DIAGNOSIS — I69351 Hemiplegia and hemiparesis following cerebral infarction affecting right dominant side: Secondary | ICD-10-CM | POA: Diagnosis not present

## 2024-10-27 DIAGNOSIS — G4709 Other insomnia: Secondary | ICD-10-CM | POA: Diagnosis not present

## 2024-10-27 DIAGNOSIS — I5022 Chronic systolic (congestive) heart failure: Secondary | ICD-10-CM | POA: Diagnosis not present

## 2024-10-27 DIAGNOSIS — D631 Anemia in chronic kidney disease: Secondary | ICD-10-CM | POA: Diagnosis not present

## 2024-10-27 DIAGNOSIS — I69322 Dysarthria following cerebral infarction: Secondary | ICD-10-CM

## 2024-10-27 DIAGNOSIS — N1831 Chronic kidney disease, stage 3a: Secondary | ICD-10-CM | POA: Diagnosis not present

## 2024-10-27 NOTE — Progress Notes (Signed)
 Chief Complaint  Patient presents with   Home Health   Received home health orders orders from [facility]. Start of care 07/10/2024.   Certification and orders from 09/08/2024 through 11/06/2024 are reviewed, signed and faxed back to home health company.  Need of intermittent skilled services at home: SN, OT, and ST  The home health care plan has been established by me and will be reviewed and updated as needed to maximize patient recovery.  I certify that all home health services have been and will be furnished to the patient while under my care.  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

## 2024-10-28 ENCOUNTER — Ambulatory Visit: Attending: Cardiology | Admitting: Cardiology

## 2024-10-28 ENCOUNTER — Encounter: Payer: Self-pay | Admitting: Cardiology

## 2024-10-28 VITALS — BP 110/62 | HR 74 | Ht 72.0 in | Wt 255.8 lb

## 2024-10-28 DIAGNOSIS — I483 Typical atrial flutter: Secondary | ICD-10-CM | POA: Diagnosis not present

## 2024-10-28 DIAGNOSIS — E782 Mixed hyperlipidemia: Secondary | ICD-10-CM

## 2024-10-28 DIAGNOSIS — I5020 Unspecified systolic (congestive) heart failure: Secondary | ICD-10-CM | POA: Diagnosis not present

## 2024-10-28 MED ORDER — CARVEDILOL 3.125 MG PO TABS: 3.1250 mg | ORAL_TABLET | Freq: Two times a day (BID) | ORAL | 3 refills | Status: DC

## 2024-10-28 MED ORDER — LOSARTAN POTASSIUM 25 MG PO TABS: 25.0000 mg | ORAL_TABLET | Freq: Every day | ORAL | 3 refills | Status: AC

## 2024-10-28 MED ORDER — APIXABAN 5 MG PO TABS: 5.0000 mg | ORAL_TABLET | Freq: Two times a day (BID) | ORAL | 3 refills | Status: AC

## 2024-10-28 MED ORDER — FUROSEMIDE 40 MG PO TABS: 40.0000 mg | ORAL_TABLET | Freq: Every day | ORAL | 3 refills | Status: AC

## 2024-10-28 MED ORDER — ATORVASTATIN CALCIUM 80 MG PO TABS: 80.0000 mg | ORAL_TABLET | Freq: Every day | ORAL | 3 refills | Status: AC

## 2024-10-28 NOTE — Patient Instructions (Signed)
 Medication Instructions:  INCREASE LASIX  TO 40 MG DAILY   *If you need a refill on your cardiac medications before your next appointment, please call your pharmacy*  Lab Work IN 1 TO 2 WEEKS: BMP PROBNP  If you have labs (blood work) drawn today and your tests are completely normal, you will receive your results only by: MyChart Message (if you have MyChart) OR A paper copy in the mail If you have any lab test that is abnormal or we need to change your treatment, we will call you to review the results.  Testing/Procedures: ECHOCARDIOGRAM IN 12/2024  Your physician has requested that you have an echocardiogram. Echocardiography is a painless test that uses sound waves to create images of your heart. It provides your doctor with information about the size and shape of your heart and how well your heart's chambers and valves are working. This procedure takes approximately one hour. There are no restrictions for this procedure. Please do NOT wear cologne, perfume, aftershave, or lotions (deodorant is allowed). Please arrive 15 minutes prior to your appointment time.  Please note: We ask at that you not bring children with you during ultrasound (echo/ vascular) testing. Due to room size and safety concerns, children are not allowed in the ultrasound rooms during exams. Our front office staff cannot provide observation of children in our lobby area while testing is being conducted. An adult accompanying a patient to their appointment will only be allowed in the ultrasound room at the discretion of the ultrasound technician under special circumstances. We apologize for any inconvenience.   Follow-Up: At [facility], you and your health needs are our priority.  As part of our continuing mission to provide you with exceptional heart care, our providers are all part of one team.  This team includes your primary Cardiologist (physician) and Advanced Practice Providers or APPs (Physician  Assistants and Nurse Practitioners) who all work together to provide you with the care you need, when you need it.  Your next appointment:   12/2024   Provider:   Newman JINNY Lawrence, MD

## 2024-10-28 NOTE — Progress Notes (Signed)
 Cardiology Office Note:  .   Date:  10/28/2024  ID:  Jesus Lam, DOB [birth year], MRN [id number] PCP: Georgina Speaks, FNP  [facility] Providers Cardiologist:  Newman Lawrence, MD PCP: Georgina Speaks, FNP  Chief Complaint  Patient presents with   HFrEF      History of Present Illness: .    Jesus Lam is a 89 y.o. male with hypertension, hyperlipidemia, PAD, h/o DVT (08/2023), h/o recurrent stroke, HFrEF, persistent atrial flutter, hyperthyroidism controlled with methimazole   Patient is here today with his daughter.  His physical activity has been limited since his stroke.  He has exertional dyspnea but much physical activity such as walking with a walker.  He also has leg edema.  He was evaluated by Dr. Nancey given suspected arrhythmia induced cardiomyopathy due to ongoing atrial flutter.  He is scheduled to undergo cardioversion on 11/01/2024.  There will be discussion after that whether or not he will need ablation.  Vitals:   10/28/24 1404  BP: 110/62  Pulse: 74  SpO2: 94%       ROS:  Review of Systems  Cardiovascular:  Positive for chest pain, dyspnea on exertion and leg swelling. Negative for palpitations and syncope.  Musculoskeletal:        Left leg pain      Studies Reviewed: SABRA       EKG 07/14/2024: Atrial flutter with variable AV conduction Left axis deviation Left ventricular hypertrophy ( R in aVL , Cornell product ) Cannot rule out Septal infarct (cited on or before 23-Mar-2024)  When compared with ECG of 23-Mar-2024 12:33, atrial flutter has replaced sinus rhythm    Echocardiogram 09/2024:  1. Left ventricular ejection fraction, by estimation, is 25 to 30%. Left  ventricular ejection fraction by 3D volume is 29 %. The left ventricle has  severely decreased function. The left ventricle demonstrates global  hypokinesis. There is mild concentric  left ventricular hypertrophy.  Left ventricular diastolic function could not be evaluated.    2. Right ventricular systolic function is moderately reduced. The right  ventricular size is normal.   3. Left atrial size was moderately dilated.   4. Right atrial size was moderately dilated.   5. The mitral valve is normal in structure. Mild mitral valve  regurgitation. No evidence of mitral stenosis.   6. The aortic valve is tricuspid. There is moderate calcification of the  aortic valve. Aortic valve regurgitation is mild. Moderate aortic valve  stenosis. Aortic valve area, by VTI measures 1.10 cm. Aortic valve mean  gradient measures 8.2 mmHg. Aortic  valve Vmax measures 1.94 m/s.   7. Aortic dilatation noted. There is mild dilatation of the aortic root,  measuring 41 mm. There is moderate dilatation of the ascending aorta,  measuring 44 mm.   8. The inferior vena cava is normal in size with greater than 50%  respiratory variability, suggesting right atrial pressure of 3 mmHg.   Stress test 07/2024:   Findings are consistent with no ischemia and no infarction. The study is high risk due to LVEF < 35%.   No ST deviation was noted.   LV perfusion is normal.   Left ventricular function is abnormal. Global function is moderately reduced. Nuclear stress EF: 33%. The left ventricular ejection fraction is moderately decreased (30-44%). End diastolic cavity size is moderately enlarged. End systolic cavity size is moderately enlarged.   Prior study not available for comparison.  Zio patch monitor 14 days 04/29/2024 - 05/13/2024: Dominant rhythm: Atrial flutter  HR 35-134 bpm. Avg HR 69 bpm. Atrial Flutter occurred continuously (100% burden), ranging from 35-134 bpm (avg of 69 bpm). Bundle Branch Block/IVCD was present. Isolated VEs were rare. 0 episodes of SVT. 0 episodes of VT. <1% isolated VE, couplet/triplets. No SVT/VT/high grade AV block, sinus pause >3sec noted. 0 patient triggered events.     Echocardiogram 02/2024: 1. Left ventricular ejection fraction, by estimation, is 45 to  50%. The  left ventricle has mildly decreased function. The left ventricle  demonstrates global hypokinesis. There is moderate concentric left  ventricular hypertrophy. Left ventricular  diastolic parameters are consistent with Grade I diastolic dysfunction  (impaired relaxation).   2. Right ventricular systolic function is normal. The right ventricular  size is normal. There is normal pulmonary artery systolic pressure.   3. Left atrial size was moderately dilated.   4. The mitral valve is normal in structure. Trivial mitral valve  regurgitation. No evidence of mitral stenosis.   5. The aortic valve is calcified. There is mild calcification of the  aortic valve. There is mild thickening of the aortic valve. Aortic valve  regurgitation is mild to moderate. Mild aortic valve stenosis. Aortic  valve area, by VTI measures 1.41 cm.  Aortic valve mean gradient measures 12.0 mmHg. Aortic valve Vmax measures  2.51 m/s.   6. Aortic dilatation noted. There is mild dilatation of the ascending  aorta, measuring 41 mm.   7. The inferior vena cava is normal in size with greater than 50%  respiratory variability, suggesting right atrial pressure of 3 mmHg.     Labs 09/2024: Hb 11.8 Cr 1.34, eGFR 51, Na 145 proBNP 846  Labs 4-06/2024: Chol 94, TG 112, HDL 23, LDL 49 HbA1C 4.8% Hb 12.6 Cr 1.4, eGFR 51 TSH 0.28, free T4 1.2   Labs  09/17/2023: eGFR 59 NT proBNP 213 LDL 42, triglycerides 133 (05/2023)   Physical Exam:   Physical Exam Vitals and nursing note reviewed.  Constitutional:      General: He is not in acute distress. Neck:     Vascular: No JVD.  Cardiovascular:     Rate and Rhythm: Normal rate. Rhythm irregular.     Pulses: Decreased pulses.     Heart sounds: Normal heart sounds. No murmur heard. Pulmonary:     Effort: Pulmonary effort is normal.     Breath sounds: Normal breath sounds. No wheezing or rales.  Musculoskeletal:     Right lower leg: Edema (Trace)  present.     Left lower leg: Edema (1+) present.      VISIT DIAGNOSES:   ICD-10-CM   1. HFrEF (heart failure with reduced ejection fraction) (HCC)  I50.20 ECHOCARDIOGRAM COMPLETE    Pro b natriuretic peptide (BNP)    Basic metabolic panel with GFR    Basic metabolic panel with GFR    Pro b natriuretic peptide (BNP)    2. Typical atrial flutter (HCC)  I48.3 apixaban  (ELIQUIS ) 5 MG TABS tablet    3. Mixed hyperlipidemia  E78.2          ASSESSMENT AND PLAN: .    Dionis Autry is a 89 y.o. male with hypertension, hyperlipidemia, PAD, h/o DVT (08/2023), h/o recurrent stroke, HFrEF, persistent atrial flutter, hyperthyroidism controlled with methimazole   Persistent atrial flutter: Possibly related to hypothyroidism, as well as probable cause for HFrEF. Plan for cardioversion on 11/01/2024. Continue Eliquis  5 mg twice daily. Follow-up with Dr. Nancey to discuss whether or not he would need ablation.  Given his age,  that may be a deterrent.  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  No ischemia/infarct on stress testing.  He did not tolerate SGLT2 in the past due to hypotension. For now, continue losartan  25 mg daily, Coreg  3.125 mg twice daily.  Increase Lasix  to 40 mg daily.  Check BMP, proBNP in 1 week. If renal function will allow, could consider MRA.   Repeat echocardiogram in 12/2024.  If EF remains reduced, could consider switching losartan  to Entresto .  Hypertension: Controlled  Mixed hyperlipidemia: Well controlled   F/u in 3 months   Signed, Newman JINNY Lawrence, MD

## 2024-10-31 MED ORDER — LEVOFLOXACIN 250 MG PO TABS: 250.0000 mg | ORAL_TABLET | Freq: Every day | ORAL | 0 refills | Status: AC

## 2024-10-31 NOTE — Anesthesia Preprocedure Evaluation (Addendum)
 Anesthesia Evaluation  Patient identified by MRN, date of birth, ID band Patient awake    Reviewed: Allergy & Precautions, NPO status , Patient's Chart, lab work & pertinent test results  History of Anesthesia Complications Negative for: history of anesthetic complications  Airway Mallampati: II  TM Distance: >3 FB Neck ROM: Full    Dental no notable dental hx. (+) Upper Dentures, Lower Dentures   Pulmonary former smoker   Pulmonary exam normal breath sounds clear to auscultation       Cardiovascular hypertension, Pt. on home beta blockers and Pt. on medications +CHF (HFrEF), + DOE and + DVT  Normal cardiovascular exam+ dysrhythmias Atrial Fibrillation  Rhythm:Regular Rate:Abnormal  10/14/2024 TTE  1. Left ventricular ejection fraction, by estimation, is 25 to 30%. Left  ventricular ejection fraction by 3D volume is 29 %. The left ventricle has  severely decreased function. The left ventricle demonstrates global  hypokinesis. There is mild concentric  left ventricular hypertrophy.  Left ventricular diastolic function could not be evaluated.   2. Right ventricular systolic function is moderately reduced. The right  ventricular size is normal.   3. Left atrial size was moderately dilated.   4. Right atrial size was moderately dilated.   5. The mitral valve is normal in structure. Mild mitral valve  regurgitation. No evidence of mitral stenosis.   6. The aortic valve is tricuspid. There is moderate calcification of the  aortic valve. Aortic valve regurgitation is mild. Moderate aortic valve  stenosis. Aortic valve area, by VTI measures 1.10 cm. Aortic valve mean  gradient measures 8.2 mmHg. Aortic  valve Vmax measures 1.94 m/s.   7. Aortic dilatation noted. There is mild dilatation of the aortic root,  measuring 41 mm. There is moderate dilatation of the ascending aorta,  measuring 44 mm.   8. The inferior vena cava is normal  in size with greater than 50%  respiratory variability, suggesting right atrial pressure of 3 mmHg.     Neuro/Psych CVA, Residual Symptoms    GI/Hepatic   Endo/Other   Hyperthyroidism   Renal/GU      Musculoskeletal  (+) Arthritis ,    Abdominal   Peds  Hematology   Anesthesia Other Findings   Reproductive/Obstetrics                              Anesthesia Physical Anesthesia Plan  ASA: 4  Anesthesia Plan: General   Post-op Pain Management: Minimal or no pain anticipated   Induction: Intravenous  PONV Risk Score and Plan: 2 and Treatment may vary due to age or medical condition and Propofol  infusion  Airway Management Planned: Natural Airway and Nasal Cannula  Additional Equipment: None  Intra-op Plan:   Post-operative Plan:   Informed Consent: I have reviewed the patients History and Physical, chart, labs and discussed the procedure including the risks, benefits and alternatives for the proposed anesthesia with the patient or authorized representative who has indicated his/her understanding and acceptance.     Dental advisory given  Plan Discussed with:   Anesthesia Plan Comments:          Anesthesia Quick Evaluation

## 2024-10-31 NOTE — Assessment & Plan Note (Signed)
 Continue statin

## 2024-10-31 NOTE — Assessment & Plan Note (Signed)
 Low BP during hospitalization affected Entresto  tolerance. - Continue current antihypertensive regimen.

## 2024-10-31 NOTE — Assessment & Plan Note (Signed)
 Recent hospitalization for volume overload, diuresis of 14 liters. Improved leg swelling and dyspnea. Entresto  not tolerated due to hypotension. - Continue Lasix , Coreg , and losartan . - Encouraged dietary salt reduction. - Advised against recliner sleeping; recommended bed with head elevation. - Discussed hospital or adjustable bed for sleeping. - Monitor fluid intake and diuretic adherence.

## 2024-10-31 NOTE — Assessment & Plan Note (Signed)
 Requires monitoring of kidney function. - Ordered BMP to assess kidney function.

## 2024-11-01 ENCOUNTER — Encounter (HOSPITAL_COMMUNITY): Payer: Self-pay | Admitting: Cardiology

## 2024-11-01 ENCOUNTER — Observation Stay (HOSPITAL_COMMUNITY)
Admission: RE | Admit: 2024-11-01 | Discharge: 2024-11-03 | DRG: 308 | Disposition: A | Attending: Cardiology | Admitting: Cardiology

## 2024-11-01 ENCOUNTER — Encounter (HOSPITAL_COMMUNITY): Admission: RE | Disposition: A | Payer: Self-pay | Source: Home / Self Care | Attending: Cardiology

## 2024-11-01 ENCOUNTER — Encounter (HOSPITAL_COMMUNITY): Payer: Self-pay | Admitting: Anesthesiology

## 2024-11-01 ENCOUNTER — Other Ambulatory Visit: Payer: Self-pay

## 2024-11-01 ENCOUNTER — Ambulatory Visit (HOSPITAL_COMMUNITY)

## 2024-11-01 ENCOUNTER — Observation Stay (HOSPITAL_COMMUNITY)

## 2024-11-01 DIAGNOSIS — I5023 Acute on chronic systolic (congestive) heart failure: Secondary | ICD-10-CM

## 2024-11-01 DIAGNOSIS — I483 Typical atrial flutter: Secondary | ICD-10-CM

## 2024-11-01 DIAGNOSIS — I10 Essential (primary) hypertension: Secondary | ICD-10-CM | POA: Diagnosis not present

## 2024-11-01 DIAGNOSIS — I484 Atypical atrial flutter: Secondary | ICD-10-CM | POA: Diagnosis not present

## 2024-11-01 DIAGNOSIS — R918 Other nonspecific abnormal finding of lung field: Secondary | ICD-10-CM | POA: Diagnosis not present

## 2024-11-01 DIAGNOSIS — I4892 Unspecified atrial flutter: Secondary | ICD-10-CM

## 2024-11-01 DIAGNOSIS — E785 Hyperlipidemia, unspecified: Secondary | ICD-10-CM

## 2024-11-01 DIAGNOSIS — I161 Hypertensive emergency: Principal | ICD-10-CM | POA: Diagnosis present

## 2024-11-01 DIAGNOSIS — I5020 Unspecified systolic (congestive) heart failure: Secondary | ICD-10-CM | POA: Diagnosis not present

## 2024-11-01 DIAGNOSIS — I509 Heart failure, unspecified: Secondary | ICD-10-CM | POA: Diagnosis not present

## 2024-11-01 HISTORY — PX: CARDIOVERSION: EP1203

## 2024-11-01 LAB — GLUCOSE, CAPILLARY: Glucose-Capillary: 90 mg/dL (ref 70–99)

## 2024-11-01 LAB — COMPREHENSIVE METABOLIC PANEL WITH GFR
ALT: 15 U/L (ref 0–44)
AST: 18 U/L (ref 15–41)
Albumin: 3.2 g/dL — ABNORMAL LOW (ref 3.5–5.0)
Alkaline Phosphatase: 70 U/L (ref 38–126)
Anion gap: 7 (ref 5–15)
BUN: 23 mg/dL (ref 8–23)
CO2: 30 mmol/L (ref 22–32)
Calcium: 9.2 mg/dL (ref 8.9–10.3)
Chloride: 105 mmol/L (ref 98–111)
Creatinine, Ser: 1.46 mg/dL — ABNORMAL HIGH (ref 0.61–1.24)
GFR, Estimated: 46 mL/min — ABNORMAL LOW (ref >60)
Glucose, Bld: 114 mg/dL — ABNORMAL HIGH (ref 70–99)
Potassium: 4.4 mmol/L (ref 3.5–5.1)
Sodium: 142 mmol/L (ref 135–145)
Total Bilirubin: 1.2 mg/dL (ref 0.0–1.2)
Total Protein: 8.1 g/dL (ref 6.5–8.1)

## 2024-11-01 LAB — CBC
HCT: 39.4 % (ref 39.0–52.0)
Hemoglobin: 12.3 g/dL — ABNORMAL LOW (ref 13.0–17.0)
MCH: 29.6 pg (ref 26.0–34.0)
MCHC: 31.2 g/dL (ref 30.0–36.0)
MCV: 94.7 fL (ref 80.0–100.0)
Platelets: 188 K/uL (ref 150–400)
RBC: 4.16 MIL/uL — ABNORMAL LOW (ref 4.22–5.81)
RDW: 15.2 % (ref 11.5–15.5)
WBC: 6.1 K/uL (ref 4.0–10.5)
nRBC: 0 % (ref 0.0–0.2)

## 2024-11-01 LAB — BRAIN NATRIURETIC PEPTIDE: B Natriuretic Peptide: 106.3 pg/mL — ABNORMAL HIGH (ref 0.0–100.0)

## 2024-11-01 SURGERY — CARDIOVERSION (CATH LAB)
Anesthesia: General

## 2024-11-01 MED ORDER — SODIUM CHLORIDE 0.9 % IV SOLN: 250.0000 mL | INTRAVENOUS | Status: AC | PRN

## 2024-11-01 MED ORDER — APIXABAN 5 MG PO TABS
5.0000 mg | ORAL_TABLET | Freq: Two times a day (BID) | ORAL | Status: DC
  Administered 2024-11-01 – 2024-11-03 (×4): 5 mg via ORAL
  Filled 2024-11-01 (×3): qty 1

## 2024-11-01 MED ORDER — METOPROLOL TARTRATE 5 MG/5ML IV SOLN
INTRAVENOUS | Status: AC
  Filled 2024-11-01: qty 5

## 2024-11-01 MED ORDER — SODIUM CHLORIDE 0.9% FLUSH
3.0000 mL | Freq: Two times a day (BID) | INTRAVENOUS | Status: DC
  Administered 2024-11-01 – 2024-11-03 (×3): 3 mL via INTRAVENOUS

## 2024-11-01 MED ORDER — ATORVASTATIN CALCIUM 80 MG PO TABS
80.0000 mg | ORAL_TABLET | Freq: Every day | ORAL | Status: DC
  Administered 2024-11-02 – 2024-11-03 (×2): 80 mg via ORAL
  Filled 2024-11-01 (×2): qty 1

## 2024-11-01 MED ORDER — ACETAMINOPHEN 325 MG PO TABS: 650.0000 mg | ORAL_TABLET | ORAL | Status: DC | PRN

## 2024-11-01 MED ORDER — SODIUM CHLORIDE 0.9% FLUSH: 3.0000 mL | INTRAVENOUS | Status: DC | PRN

## 2024-11-01 MED ORDER — FUROSEMIDE 10 MG/ML IJ SOLN
INTRAMUSCULAR | Status: AC
  Filled 2024-11-01: qty 8

## 2024-11-01 MED ORDER — MIRTAZAPINE 7.5 MG PO TABS
7.5000 mg | ORAL_TABLET | Freq: Every day | ORAL | Status: DC
  Administered 2024-11-01 – 2024-11-02 (×2): 7.5 mg via ORAL
  Filled 2024-11-01 (×2): qty 1

## 2024-11-01 MED ORDER — TAMSULOSIN HCL 0.4 MG PO CAPS
0.4000 mg | ORAL_CAPSULE | Freq: Every day | ORAL | Status: DC
  Administered 2024-11-02 – 2024-11-03 (×2): 0.4 mg via ORAL
  Filled 2024-11-01 (×2): qty 1

## 2024-11-01 MED ORDER — METHIMAZOLE 10 MG PO TABS
10.0000 mg | ORAL_TABLET | Freq: Every day | ORAL | Status: DC
  Administered 2024-11-01 – 2024-11-03 (×3): 10 mg via ORAL
  Filled 2024-11-01 (×3): qty 1

## 2024-11-01 MED ORDER — SODIUM CHLORIDE 0.9 % IV SOLN: INTRAVENOUS | Status: DC

## 2024-11-01 MED ORDER — DICLOFENAC SODIUM 1 % EX GEL
2.0000 g | Freq: Two times a day (BID) | CUTANEOUS | Status: DC
  Administered 2024-11-02 – 2024-11-03 (×2): 2 g via TOPICAL
  Filled 2024-11-01 (×2): qty 100

## 2024-11-01 MED ORDER — METOPROLOL TARTRATE 5 MG/5ML IV SOLN
5.0000 mg | Freq: Once | INTRAVENOUS | Status: AC
  Administered 2024-11-01: 5 mg via INTRAVENOUS

## 2024-11-01 MED ORDER — LOSARTAN POTASSIUM 25 MG PO TABS
25.0000 mg | ORAL_TABLET | Freq: Every day | ORAL | Status: DC
  Administered 2024-11-01 – 2024-11-03 (×3): 25 mg via ORAL
  Filled 2024-11-01 (×4): qty 1

## 2024-11-01 MED ORDER — ONDANSETRON HCL 4 MG/2ML IJ SOLN: 4.0000 mg | Freq: Four times a day (QID) | INTRAMUSCULAR | Status: DC | PRN

## 2024-11-01 MED ORDER — FUROSEMIDE 10 MG/ML IJ SOLN
80.0000 mg | Freq: Two times a day (BID) | INTRAMUSCULAR | Status: DC
  Administered 2024-11-01 – 2024-11-02 (×3): 80 mg via INTRAVENOUS
  Filled 2024-11-01 (×2): qty 8

## 2024-11-01 SURGICAL SUPPLY — 1 items: PAD DEFIB RADIO PHYSIO CONN (PAD) ×1 IMPLANT

## 2024-11-01 NOTE — Progress Notes (Addendum)
 IV team present in CT for code stroke. 20G from the last 24hrs present on assessment. No further access needed.

## 2024-11-01 NOTE — Code Documentation (Signed)
 Code Stroke SRN Note:  Jesus Lam is an 89 yr old with a PMH of AF on Eliquis , and prior left sided stroke. He was LKW today at [phone] while preparing for cardioversion. At 1003 his RN noticed that he was having difficulty speaking. Inpt code stroke was activated.   Stroke team to bedside. Pt's BP and CBG WNL. Symptoms have resolved. NIHSS 0. Pt to CT with team. CT noncontrast preformed.   Per Dr. Voncile, CT is negative for acute abnormality. Pt has documented history of mild-moderate aphasia from previous CVA. Pt taken back to Cath Holding area. Code stroke cancelled. Bedside handoff with Bernardino RN complete.

## 2024-11-01 NOTE — Anesthesia Procedure Notes (Deleted)
 Procedure Name: General with mask airway Date/Time: 11/01/2024 10:01 AM  Performed by: Arvell Edsel HERO, CRNAPre-anesthesia Checklist: Patient identified, Emergency Drugs available, Suction available, Patient being monitored and Timeout performed Patient Re-evaluated:Patient Re-evaluated prior to induction Oxygen Delivery Method: Nasal cannula

## 2024-11-01 NOTE — Interval H&P Note (Signed)
 History and Physical Interval Note:  11/01/2024 9:51 AM  Jesus Lam  has presented today for surgery, with the diagnosis of AFLUTTER.  The various methods of treatment have been discussed with the patient and family. After consideration of risks, benefits and other options for treatment, the patient has consented to  Procedure(s): CARDIOVERSION (N/A) as a surgical intervention.  The patient's history has been reviewed, patient examined, no change in status, stable for surgery.  I have reviewed the patient's chart and labs.  Questions were answered to the patient's satisfaction.     Wilbert Bihari

## 2024-11-01 NOTE — H&P (Signed)
 Cardiology Admission History and Physical   Patient ID: Jesus Lam MRN: [id number]; DOB: October 21, 1933   Admission date: 11/01/2024  PCP:  Georgina Speaks, FNP   [facility] Providers Cardiologist:  Newman JINNY Lawrence, MD  Chief Complaint:  HTN emergency  Patient Profile: Jesus Lam is a 89 y.o. male with NICM with reduced EF, persistent atrial fibrillation, dilated aorta, CKD, hyperthyroidism, BPH, PAD with SFA stenting, DVT, recurrent CVA, who is being seen 11/01/2024 for the evaluation of hypertensive emergency and acute HFrEF.  History of Present Illness: Jesus Lam has had prior cardiac catheterization 12/2017 with nonobstructive CAD and known history of severe bilateral PAD status post DCB to RT SFA and overlapping stents to that L SFA at the same time.  EF has been low since 2019 25 to 30% but has fluctuated during this year and has trended down.  02/2024 he had admission for expressive and receptive aphasia with no emergent large vessel occlusion but he did reveal completed infarct in the superior left carotid cortex with acute left distal MCA branch infarction.  Not deemed a candidate for TNK or thrombectomy.  EF 45 to 50% at that time.  Got a heart monitor 05/2024 showing 100% atrial flutter.  Again 05/2024 seen in the ED for aphasia/dysarthria, MRI with no new intracranial abnormalities. 07/2024 nonischemic Myoview .  08/2024 admitted for CHF exacerbation.  EF showed further drop 25 to 30%.  GDMT limited by hypotension, did not tolerate Entresto  or spironolactone .  During that admission he was started on Jardiance  but had UTI so this was subsequently stopped.  He was discharged on losartan  25 mg and carvedilol  3.125 mg twice daily.  Since then he has been seen outpatient and with plans of ablation but needed DCCV first.  Seen outpatient 12/4 and reported exertional dyspnea, Lasix  increased from 20 mg to 40 mg.  Today patient was here for outpatient DCCV prior to procedure he  started demonstrating concerning features witnessed aphasia that resolved within a few minutes and had hypertensive emergency with blood pressure around 160s over 120s.  Code stroke was called, CT negative.  Neurology felt like episode was recrudescence of old stroke symptoms in the setting of severe anxiety.  Did not recommend further imaging and signed off.  In the meantime he was given 1 dose of IV Lopressor  5 mg.  BP at the bedside improved, 130/80.  Patient is accompanied with his daughter.  They report 100% compliancy with all of his medications and has not missed any doses of the Eliquis .  Daughter gives him all of his medications.  They have been reporting since his discharge she has had progressive weight gain and worsening exertional dyspnea with orthopnea.  Weight has steadily increased.  Discharge weight 08/2024 was 240.  They report that he is around 255 now.  Despite increase of Lasix  to 40 mg has not had any meaningful improvement in symptoms.  Past Medical History:  Diagnosis Date   Anemia    BPH (benign prostatic hyperplasia)    Cataracts, bilateral    CVA (cerebral vascular accident) (HCC)    CVA (cerebrovascular accident due to intracerebral hemorrhage) (HCC) 03/22/2024   Deep venous thrombosis (DVT) of left peroneal vein (HCC) 09/24/2023   DVT (deep venous thrombosis) (HCC)    dvt in left leg   Dyslipidemia    Dysuria 06/17/2023   Gout    Gout    HTN (hypertension)    Ischemic cerebrovascular accident (CVA) (HCC) 06/13/2018   Left hip pain 03/15/2020  Left leg pain    Lower abdominal pain 11/03/2023   Osteoarthritis    PAD (peripheral artery disease)    Stasis dermatitis    Stroke (HCC)    Vitamin D  deficiency    Past Surgical History:  Procedure Laterality Date   ABDOMINAL AORTOGRAM N/A 01/13/2018   Procedure: ABDOMINAL AORTOGRAM;  Surgeon: Elmira Newman PARAS, MD;  Location: [facility];  Service: Cardiovascular;  Laterality: N/A;   CATARACT EXTRACTION,  BILATERAL     LEFT HEART CATH AND CORONARY ANGIOGRAPHY N/A 01/13/2018   Procedure: LEFT HEART CATH AND CORONARY ANGIOGRAPHY;  Surgeon: Elmira Newman PARAS, MD;  Location: [facility];  Service: Cardiovascular;  Laterality: N/A;   LOWER EXTREMITY ANGIOGRAPHY N/A 01/13/2018   Procedure: LOWER EXTREMITY ANGIOGRAPHY;  Surgeon: Elmira Newman PARAS, MD;  Location: [facility];  Service: Cardiovascular;  Laterality: N/A;   LOWER EXTREMITY ANGIOGRAPHY Right 01/27/2018   Procedure: LOWER EXTREMITY ANGIOGRAPHY;  Surgeon: Ladona Heinz, MD;  Location: [facility];  Service: Cardiovascular;  Laterality: Right;   PERIPHERAL VASCULAR ATHERECTOMY Left 01/13/2018   Procedure: PERIPHERAL VASCULAR ATHERECTOMY;  Surgeon: Elmira Newman PARAS, MD;  Location: [facility];  Service: Cardiovascular;  Laterality: Left;  SFA WITH PTA DRUG COATED BALLOON   PERIPHERAL VASCULAR INTERVENTION  01/27/2018   Procedure: PERIPHERAL VASCULAR INTERVENTION;  Surgeon: Ladona Heinz, MD;  Location: [facility];  Service: Cardiovascular;;     Medications Prior to Admission: Prior to Admission medications   Medication Sig Start Date End Date Taking? Authorizing Provider  apixaban  (ELIQUIS ) 5 MG TABS tablet Take 1 tablet (5 mg total) by mouth 2 (two) times daily. 10/28/24  Yes Patwardhan, Manish J, MD  atorvastatin  (LIPITOR ) 80 MG tablet Take 1 tablet (80 mg total) by mouth daily. 10/28/24  Yes Patwardhan, Manish J, MD  azelastine  (ASTELIN ) 0.1 % nasal spray Place 2 sprays into both nostrils 2 (two) times daily. Use in each nostril as directed Patient taking differently: Place 2 sprays into both nostrils 2 (two) times daily as needed. Use in each nostril as directed 08/15/21  Yes Georgina Speaks, FNP  carvedilol  (COREG ) 3.125 MG tablet Take 1 tablet (3.125 mg total) by mouth 2 (two) times daily with a meal. 10/28/24 11/27/24 Yes Patwardhan, Manish J, MD  Cholecalciferol  (VITAMIN D3) 5000 units CAPS Take 5,000 Units by  mouth daily. 01/09/18  Yes [provider]  diclofenac  Sodium (VOLTAREN ) 1 % GEL Apply 2 g topically 4 (four) times daily. APPLY TWO GRAMS TOPICALLY FOUR TIMES A DAY Patient taking differently: Apply 2 g topically 2 (two) times daily. APPLY TWO GRAMS TOPICALLY FOUR TIMES A DAY 10/07/24  Yes Georgina Speaks, FNP  furosemide  (LASIX ) 40 MG tablet Take 1 tablet (40 mg total) by mouth daily. 10/28/24  Yes Patwardhan, Manish J, MD  GNP 8 HOUR ARTHRITIS RELIEF 650 MG CR tablet TAKE ONE TABLET BY MOUTH TWICE A DAY 10/19/24  Yes Georgina Speaks, FNP  levofloxacin  (LEVAQUIN ) 250 MG tablet Take 1 tablet (250 mg total) by mouth daily for 7 days. 10/31/24 11/07/24 Yes Georgina Speaks, FNP  losartan  (COZAAR ) 25 MG tablet Take 1 tablet (25 mg total) by mouth daily. 10/28/24 11/27/24 Yes Patwardhan, Manish J, MD  methimazole  (TAPAZOLE ) 10 MG tablet Take 1 tablet (10 mg total) by mouth daily. 05/19/24  Yes Shamleffer, Ibtehal Jaralla, MD  mirtazapine  (REMERON ) 7.5 MG tablet TAKE 1 TABLET (7.5 MG TOTAL) BY MOUTH AT BEDTIME. 10/19/24  Yes Georgina Speaks, FNP  Multiple Vitamins-Minerals (COMPLETE SENIOR PO) 1 tablet daily.  06/06/18  Yes [provider]  tamsulosin  (FLOMAX ) 0.4 MG CAPS capsule TAKE ONE CAPSULE BY MOUTH DAILY HALF HOUR FOLLOWING THE SAME MEAL DAILY Patient taking differently: Take 0.4 mg by mouth daily. TAKE ONE CAPSULE BY MOUTH DAILY HALF HOUR FOLLOWING THE SAME MEAL DAILY 11/13/21  Yes Georgina Speaks, FNP  vitamin B-12 (CYANOCOBALAMIN ) 500 MCG tablet Take 500 mcg by mouth daily.   Yes [provider]  vitamin C  (ASCORBIC ACID ) 500 MG tablet Take 500 mg daily by mouth.   Yes [provider]     Allergies:    Allergies  Allergen Reactions   Porcine (Pork) Protein-Containing Drug Products Other (See Comments)    Religious    Shellfish Allergy Other (See Comments)    Gout     Social History:   Social History   Socioeconomic History   Marital status: Single    Spouse name:  Not on file   Number of children: 3   Years of education: Not on file   Highest education level: Not on file  Occupational History   Occupation: retired  Tobacco Use   Smoking status: Former    Current packs/day: 0.00    Average packs/day: 0.3 packs/day for 0.5 years (0.1 ttl pk-yrs)    Types: Cigarettes    Start date: 05/1978    Quit date: 1980    Years since quitting: 45.9   Smokeless tobacco: Never  Vaping Use   Vaping status: Never Used  Substance and Sexual Activity   Alcohol use: Not Currently    Alcohol/week: 6.0 - 7.0 standard drinks of alcohol    Types: 6 - 7 Shots of liquor per week    Comment: on occasion   Drug use: Not Currently    Types: Marijuana    Comment: last used x 2 hours ago   Sexual activity: Yes  Other Topics Concern   Not on file  Social History Narrative   ** Merged History Encounter **       Lives with daughter, [name]. 2 daughters live here in [place]. Son lives in [place]. 3/16- offered information on local food pantries as well as congregate meal sites. Information declined.   Social Drivers of Corporate Investment Banker Strain: Low Risk  (06/16/2024)   Overall Financial Resource Strain (CARDIA)    Difficulty of Paying Living Expenses: Not hard at all  Food Insecurity: No Food Insecurity (09/07/2024)   Hunger Vital Sign    Worried About Running Out of Food in the Last Year: Never true    Ran Out of Food in the Last Year: Never true  Transportation Needs: No Transportation Needs (09/07/2024)   PRAPARE - Administrator, Civil Service (Medical): No    Lack of Transportation (Non-Medical): No  Physical Activity: Inactive (06/16/2024)   Exercise Vital Sign    Days of Exercise per Week: 0 days    Minutes of Exercise per Session: 0 min  Stress: No Stress Concern Present (06/16/2024)   [employer] of Occupational Health - Occupational Stress Questionnaire    Feeling of Stress: Not at all  Social Connections: Patient  Declined (09/07/2024)   Social Connection and Isolation Panel    Frequency of Communication with Friends and Family: Patient declined    Frequency of Social Gatherings with Friends and Family: Patient declined    Attends Religious Services: Patient declined    Database Administrator or Organizations: Patient declined  Attends Banker Meetings: Patient declined    Marital Status: Patient declined  Recent Concern: Social Connections - Socially Isolated (06/16/2024)   Social Connection and Isolation Panel    Frequency of Communication with Friends and Family: More than three times a week    Frequency of Social Gatherings with Friends and Family: More than three times a week    Attends Religious Services: Never    Database Administrator or Organizations: No    Attends Banker Meetings: Never    Marital Status: Widowed  Intimate Partner Violence: Not At Risk (09/07/2024)   Humiliation, Afraid, Rape, and Kick questionnaire    Fear of Current or Ex-Partner: No    Emotionally Abused: No    Physically Abused: No    Sexually Abused: No     Family History:  The patient's family history includes Alcohol abuse in his father; Breast cancer in his daughter; CVA in his daughter; Cancer in his father and mother; Stroke in his daughter.    ROS:  Please see the history of present illness. All other ROS reviewed and negative.     Physical Exam/Data: Vitals:   11/01/24 1200 11/01/24 1218 11/01/24 1230 11/01/24 1300  BP: (!) 158/123 (!) 168/106 132/89 (!) 140/84  Pulse: 88 75 69 60  Resp: (!) 21 (!) 31 (!) 23 (!) 26  Temp:      TempSrc:      SpO2: 93% 91% 92% 94%   No intake or output data in the 24 hours ending 11/01/24 1307    10/28/2024    2:04 PM 10/19/2024   11:13 AM 10/15/2024   12:12 PM  Last 3 Weights  Weight (lbs) 255 lb 12.8 oz 255 lb 12.8 oz 258 lb 3.2 oz  Weight (kg) 116.03 kg 116.03 kg 117.119 kg     There is no height or weight on file to calculate  BMI.  General:  SOB HEENT: normal Neck: + JVD Vascular: No carotid bruits; Distal pulses 2+ bilaterally   Cardiac:  normal S1, S2; RRR; no murmur Lungs:  clear to auscultation bilaterally, no wheezing, rhonchi or rales  Abd: distended firm abdomen  Ext: 2+ edema Musculoskeletal:  No deformities, BUE and BLE strength normal and equal Skin: warm and dry  Neuro:  CNs 2-12 intact, no focal abnormalities noted Psych:  Normal affect   EKG:  The ECG pending  Relevant CV Studies: Echocardiogram 09/2024 1. Left ventricular ejection fraction, by estimation, is 25 to 30%. Left  ventricular ejection fraction by 3D volume is 29 %. The left ventricle has  severely decreased function. The left ventricle demonstrates global  hypokinesis. There is mild concentric   left ventricular hypertrophy. Left ventricular diastolic function could  not be evaluated.   2. Right ventricular systolic function is moderately reduced. The right  ventricular size is normal.   3. Left atrial size was moderately dilated.   4. Right atrial size was moderately dilated.   5. The mitral valve is normal in structure. Mild mitral valve  regurgitation. No evidence of mitral stenosis.   6. The aortic valve is tricuspid. There is moderate calcification of the  aortic valve. Aortic valve regurgitation is mild. Moderate aortic valve  stenosis. Aortic valve area, by VTI measures 1.10 cm. Aortic valve mean  gradient measures 8.2 mmHg. Aortic  valve Vmax measures 1.94 m/s.   7. Aortic dilatation noted. There is mild dilatation of the aortic root,  measuring 41 mm. There is moderate dilatation  of the ascending aorta,  measuring 44 mm.   8. The inferior vena cava is normal in size with greater than 50%  respiratory variability, suggesting right atrial pressure of 3 mmHg.    Laboratory Data: High Sensitivity Troponin:  No results for input(s): TROPONINIHS in the last 720 hours.    ChemistryNo results for input(s): NA,  K, CL, CO2, GLUCOSE, BUN, CREATININE, CALCIUM , MG, GFRNONAA, GFRAA, ANIONGAP in the last 168 hours.  No results for input(s): PROT, ALBUMIN, AST, ALT, ALKPHOS, BILITOT in the last 168 hours. Lipids No results for input(s): CHOL, TRIG, HDL, LABVLDL, LDLCALC, CHOLHDL in the last 168 hours. HematologyNo results for input(s): WBC, RBC, HGB, HCT, MCV, MCH, MCHC, RDW, PLT in the last 168 hours. Thyroid  No results for input(s): TSH, FREET4 in the last 168 hours. BNPNo results for input(s): BNP, PROBNP in the last 168 hours.  DDimer No results for input(s): DDIMER in the last 168 hours.  Radiology/Studies:  CT HEAD CODE STROKE WO CONTRAST` Result Date: 11/01/2024 EXAM: CT HEAD WITHOUT 11/01/2024 10:36:56 AM TECHNIQUE: CT of the head was performed without the administration of intravenous contrast. Automated exposure control, iterative reconstruction, and/or weight based adjustment of the mA/kV was utilized to reduce the radiation dose to as low as reasonably achievable. COMPARISON: CT of the head dated 06/03/2024. CLINICAL HISTORY: Neuro deficit, acute, stroke suspected. FINDINGS: BRAIN AND VENTRICLES: Chronic encephalomalacia changes are again demonstrated within the left parietal and occipital lobes. A chronic infarct is also present within the right cerebellar hemisphere. Calcifications are present within the carotid siphons and vertebral arteries. No acute intracranial hemorrhage. No mass effect or midline shift. No extra-axial fluid collection. No evidence of acute infarct. No hydrocephalus. ORBITS: Status post bilateral lens replacement. No acute abnormality. SINUSES AND MASTOIDS: A polypoid mucosal density is present in the right maxillary sinus. SOFT TISSUES AND SKULL: No acute skull fracture. No acute soft tissue abnormality. [place] Stroke Program Early CT Score (ASPECTS) ----- Ganglionic (caudate, IC, lentiform nucleus, insula,  M1-M3): 7 Supraganglionic (M4-M6): 3 Total: 10 IMPRESSION: 1. No acute intracranial abnormality related to the suspected stroke. 2. Chronic encephalomalacia changes in the left parietal and occipital lobes and a chronic infarct in the right cerebellar hemisphere. 3. ASPECTS: 10. Electronically signed by: Evalene Coho MD 11/01/2024 10:44 AM EST RP Workstation: [id number]   EP STUDY Result Date: 11/01/2024 See surgical note for result.    Assessment and Plan:  Acute HFrEF/NICM -12/2017 LHC mild nonobstructive disease -02/2024 EF 45 to 50% -07/2024 normal stress test -09/2024 EF 25 to 30%, global hypokinesis.  Moderately reduced RV.  Moderately dilated atria.  Mild MR/AR.  He has had declining EF in the setting of persistent atrial fibrillation. Suspect his elevated blood pressure and current presentation related to hypervolemia.  He has had progressive weight gain and progressing NYHA class III/IV symptoms since his discharge 08/2024.   Discharge weight previously 240, they report he is 255 today.  Increase in p.o. Lasix  40 mg not beneficial.  Significantly volume up today with 2+ pitting edema, got edema and JVD.  Feels warm on exam though. Start IV Lasix  80 mg twice daily. GDMT previously limited by hypotension and with failure to tolerate Entresto  and spironolactone .  Had UTI on Jardiance . Continue losartan  25 mg daily.  Blood pressure downtrending. Hold beta-blocker with acute HFrEF Check CMP and BNP. CXR pending.   Persistent atrial fibrillation Persistent symptoms since 05/2024 with 100% burden and 3-second pause noted on heart monitor.  Fortunately rates are controlled.  With acute HFrEF and hypertensive emergency DCCV has been canceled. Continue Eliquis  5 mg twice daily. Initial plans were for DCCV followed by atrial ablation potentially.  Revisit after he achieves euvolemia.  Aortic dilatation 41 mm at the aortic root.  44 mm ascending aorta.  Continue to monitor  outpatient.  Hypertensive emergency Initially BP 160/120 with strokelike symptoms with aphasia.  Neurology evaluated and he has negative CT.  They did not recommend any further imaging and felt that his presentation was a recrudescence of old stroke like symptoms in the setting of severe anxiety.  Manage in the context above.  Will see how he does with IV diuresis before aggressively titrating his hypertensive medications as he has had issues with hypotension.  CKD Checking labs.  Hyperthyroidism  Previously elevated TSH 7.5 but with normal T4.  He has been on methimazole  chronically. Not likely related to HTN but could consider medicine team if this is still in question.   UTI Has completed his treatment with Levaquin .  History of recurrent CVAs Neurology has signed off, follow-up outpatient.  Has chronic CVA like symptoms at baseline. Continue atorvastatin  80 mg daily.   Risk Assessment/Risk Scores:  New York  Heart Association (NYHA) Functional Class NYHA Class III  CHA2DS2-VASc Score = 7   This indicates a 11.2% annual risk of stroke. The patient's score is based upon: CHF History: 1 HTN History: 1 Diabetes History: 0 Stroke History: 2 Vascular Disease History: 1 Age Score: 2 Gender Score: 0   Code Status: Full Code  Severity of Illness: The appropriate patient status for this patient is OBSERVATION. Observation status is judged to be reasonable and necessary in order to provide the required intensity of service to ensure the patient's safety. The patient's presenting symptoms, physical exam findings, and initial radiographic and laboratory data in the context of their medical condition is felt to place them at decreased risk for further clinical deterioration. Furthermore, it is anticipated that the patient will be medically stable for discharge from the hospital within 2 midnights of admission.   For questions or updates, please contact [facility] Please  consult [email or web address] for contact info under       Signed, Thom LITTIE Sluder, PA-C  11/01/2024 1:07 PM

## 2024-11-01 NOTE — Plan of Care (Signed)

## 2024-11-01 NOTE — Consult Note (Addendum)
 NEUROLOGY CONSULT NOTE   Date of service: November 01, 2024 Patient Name: Jesus Lam MRN:  [id number] DOB:  1933/04/04 Chief Complaint: Code stroke aphasia Requesting Provider: Shlomo Wilbert SAUNDERS, MD  History of Present Illness  Jesus Lam is a 89 y.o. male with hx of DVT on Eliquis , hypertension, hyperlipidemia, PAD, dementia, history of stroke in 2019, now in cardiology Cath Lab waiting area awaiting cardioversion for his atrial fibrillation, when it was noted suddenly that he was having difficulty talking.  Per report, patient came in very anxious this morning.  Code stroke was activated due to the witness aphasia, that resolved within a few minutes. According to the RN, he was nearly globally aphasic although prior to that he had been having some conversation.  This only lasted a brief few minutes.  Chart review reveals moderate aphasia at baseline as a result of his prior left hemispheric strokes.  Baseline NIH stroke scale 6 per 10/19/2024 chart review Was evaluated as a code stroke-CT imaging completed. See details below  LKW: 10 AM Modified rankin score: 3-Moderate disability-requires help but walks WITHOUT assistance IV Thrombolysis: Symptoms resolved, on Eliquis  EVT: Symptoms resolved  NIHSS components Score: Comment  1a Level of Conscious 0[x]  1[]  2[]  3[]      1b LOC Questions 0[x]  1[]  2[]       1c LOC Commands 0[x]  1[]  2[]       2 Best Gaze 0[x]  1[]  2[]       3 Visual 0[x]  1[]  2[]  3[]      4 Facial Palsy 0[x]  1[]  2[]  3[]      5a Motor Arm - left 0[x]  1[]  2[]  3[]  4[]  UN[]    5b Motor Arm - Right 0[x]  1[]  2[]  3[]  4[]  UN[]    6a Motor Leg - Left 0[]  1[x]  2[]  3[]  4[]  UN[]    6b Motor Leg - Right 0[]  1[x]  2[]  3[]  4[]  UN[]    7 Limb Ataxia 0[x]  1[]  2[]  UN[]      8 Sensory 0[x]  1[]  2[]  UN[]      9 Best Language 0[]  1[x]  2[]  3[]      10 Dysarthria 0[]  1[x]  2[]  UN[]      11 Extinct. and Inattention 0[x]  1[]  2[]       TOTAL: 4      ROS  Comprehensive ROS performed and pertinent  positives documented in HPI    Past History   Past Medical History:  Diagnosis Date   Anemia    BPH (benign prostatic hyperplasia)    Cataracts, bilateral    CVA (cerebral vascular accident) (HCC)    CVA (cerebrovascular accident due to intracerebral hemorrhage) (HCC) 03/22/2024   Deep venous thrombosis (DVT) of left peroneal vein (HCC) 09/24/2023   DVT (deep venous thrombosis) (HCC)    dvt in left leg   Dyslipidemia    Dysuria 06/17/2023   Gout    Gout    HTN (hypertension)    Ischemic cerebrovascular accident (CVA) (HCC) 06/13/2018   Left hip pain 03/15/2020   Left leg pain    Lower abdominal pain 11/03/2023   Osteoarthritis    PAD (peripheral artery disease)    Stasis dermatitis    Stroke (HCC)    Vitamin D  deficiency     Past Surgical History:  Procedure Laterality Date   ABDOMINAL AORTOGRAM N/A 01/13/2018   Procedure: ABDOMINAL AORTOGRAM;  Surgeon: Elmira Newman PARAS, MD;  Location: [facility];  Service: Cardiovascular;  Laterality: N/A;   CATARACT EXTRACTION, BILATERAL     LEFT HEART CATH AND CORONARY ANGIOGRAPHY N/A 01/13/2018  Procedure: LEFT HEART CATH AND CORONARY ANGIOGRAPHY;  Surgeon: Elmira Newman PARAS, MD;  Location: [facility];  Service: Cardiovascular;  Laterality: N/A;   LOWER EXTREMITY ANGIOGRAPHY N/A 01/13/2018   Procedure: LOWER EXTREMITY ANGIOGRAPHY;  Surgeon: Elmira Newman PARAS, MD;  Location: [facility];  Service: Cardiovascular;  Laterality: N/A;   LOWER EXTREMITY ANGIOGRAPHY Right 01/27/2018   Procedure: LOWER EXTREMITY ANGIOGRAPHY;  Surgeon: Ladona Heinz, MD;  Location: [facility];  Service: Cardiovascular;  Laterality: Right;   PERIPHERAL VASCULAR ATHERECTOMY Left 01/13/2018   Procedure: PERIPHERAL VASCULAR ATHERECTOMY;  Surgeon: Elmira Newman PARAS, MD;  Location: [facility];  Service: Cardiovascular;  Laterality: Left;  SFA WITH PTA DRUG COATED BALLOON   PERIPHERAL VASCULAR INTERVENTION  01/27/2018    Procedure: PERIPHERAL VASCULAR INTERVENTION;  Surgeon: Ladona Heinz, MD;  Location: [facility];  Service: Cardiovascular;;    Family History: Family History  Problem Relation Age of Onset   Cancer Mother    Cancer Father    Alcohol abuse Father    Breast cancer Daughter    Stroke Daughter    CVA Daughter     Social History  reports that he quit smoking about 45 years ago. His smoking use included cigarettes. He started smoking about 46 years ago. He has a 0.1 pack-year smoking history. He has never used smokeless tobacco. He reports that he does not currently use alcohol after a past usage of about 6.0 - 7.0 standard drinks of alcohol per week. He reports that he does not currently use drugs after having used the following drugs: Marijuana.  Allergies  Allergen Reactions   Porcine (Pork) Protein-Containing Drug Products Other (See Comments)    Religious    Shellfish Allergy Other (See Comments)    Gout     Medications   Current Facility-Administered Medications:    0.9 %  sodium chloride  infusion, , Intravenous, Continuous, Mealor, Eulas BRAVO, MD  Vitals   Vitals:   11/01/24 0917  BP: (!) 151/136  Pulse: 90  Resp: (!) 23  Temp: 97.8 F (36.6 C)  TempSrc: Temporal  SpO2: 96%    There is no height or weight on file to calculate BMI.   Physical Exam   General: Awake alert in no distress HEENT: Normocephalic atraumatic Lungs: Clear Cardiovascular: Irregularly irregular Psych: Somewhat anxious affect Neurological exam Awake alert oriented to self. Upon asking him how old he is, he first at 57 but then corrected himself to 6. On asking him the current month, he said November.  On asking him what holidays approaching, he said Christmas and then corrected himself for the month to be December. Does have some expressive aphasia during conversation with longer sentences. Speech is also dysarthric Cranial nerves II to XII appear intact Motor examination with no  drift in the upper extremities.  Mild drift in bilateral lower extremities Sensation intact to light touch Coordination examination reveals no dysmetria    Labs/Imaging/Neurodiagnostic studies   CBG 90  CT head personally reviewed: No evidence of bleed.  Aspects 10.  Old left hemispheric stroke revisualized.  ASSESSMENT   Jesus Lam is a 89 y.o. male with above past medical history who is currently awaiting cardioversion, had a very brief episode of what sounded like global aphasia.  At baseline, due to his prior left hemispheric infarct, he has moderate aphasia. I suspect recrudescence of old stroke symptoms in the setting of anxiety. From a stroke risk factor workup and optimization, he is  currently on Eliquis  and adequately optimized. I did refer him for a head CT-to rule out a bleed. CT head personally reviewed-no evidence of bleed  Impression: Transient episode of aphasia, recrudescence of stroke symptoms in the setting of anxiety  RECOMMENDATIONS  At this time, I do not think there is any utility in obtaining further imaging. I would defer his cardiac management to his cardiology team. He should follow-up with Dr. Rosemarie in the different neurology stroke clinic as scheduled. I had a detailed discussion of the plan with the patient as well as with Dr. Shlomo at the patient's bedside. Please call inpatient neurology with questions as needed ______________________________________________________________________    Signed, Eligio Lav, MD [facility]  I personally spent a total of 40 minutes in the care of the patient today including preparing to see the patient, getting/reviewing separately obtained history, performing a medically appropriate exam/evaluation, placing orders, referring and communicating with other health care professionals, documenting clinical information in the EHR, independently interpreting results, communicating results, and coordinating  care.

## 2024-11-01 NOTE — Progress Notes (Signed)
 Heart Failure Navigator Progress Note  Assessed for Heart & Vascular TOC clinic readiness.  Patient does not meet criteria due to per Neurology MD, patient with history of dementia. No HF TOC. .   Navigator will sign off at this time.   Stephane Haddock, BSN, Scientist, Clinical (histocompatibility And Immunogenetics) Only

## 2024-11-01 NOTE — CV Procedure (Addendum)
    Patient presented for DCCV.  He was A&Ox3 on my examination.    While the nurse was placing the IV he developed garbled speech.  I came to the room and the patient suddenly complained of something in his throat and could not swallow or cough.  He sat up but kept feeling like something was wrong with his throat.  He was markedly hypertensive at that point.  Speech completely garbled.    Code stroke called and upon their arrival his symptoms had resolved.  Total time of event approx 15 minutes.  Patient evaluated to Neuro and taken down for emergent Head CT and possible MR to rule out CVA and bleed  CT showed no evidence of bleed.  Neuro feels he either had a small CVA or TIA.  He was markedly hypertensive at the time and may have had TIA related to hypertensive emergency.  Cardioversion cancelled. Plan to admit to Cardiology for further treatment.   Eliasar Hlavaty 11/01/2024, 9:52 AM

## 2024-11-02 ENCOUNTER — Observation Stay (HOSPITAL_COMMUNITY): Admitting: Anesthesiology

## 2024-11-02 ENCOUNTER — Encounter (HOSPITAL_COMMUNITY)
Admission: RE | Disposition: A | Discharge status: Home / Self Care | Payer: Self-pay | Source: Home / Self Care | Attending: Cardiology

## 2024-11-02 ENCOUNTER — Other Ambulatory Visit: Payer: Self-pay

## 2024-11-02 ENCOUNTER — Encounter (HOSPITAL_COMMUNITY): Payer: Self-pay | Admitting: Cardiology

## 2024-11-02 DIAGNOSIS — I69320 Aphasia following cerebral infarction: Secondary | ICD-10-CM | POA: Diagnosis not present

## 2024-11-02 DIAGNOSIS — I4892 Unspecified atrial flutter: Secondary | ICD-10-CM | POA: Diagnosis present

## 2024-11-02 DIAGNOSIS — N1831 Chronic kidney disease, stage 3a: Secondary | ICD-10-CM | POA: Diagnosis not present

## 2024-11-02 DIAGNOSIS — I441 Atrioventricular block, second degree: Secondary | ICD-10-CM | POA: Diagnosis present

## 2024-11-02 DIAGNOSIS — N189 Chronic kidney disease, unspecified: Secondary | ICD-10-CM | POA: Diagnosis present

## 2024-11-02 DIAGNOSIS — Z91013 Allergy to seafood: Secondary | ICD-10-CM | POA: Diagnosis not present

## 2024-11-02 DIAGNOSIS — I484 Atypical atrial flutter: Secondary | ICD-10-CM | POA: Diagnosis not present

## 2024-11-02 DIAGNOSIS — E785 Hyperlipidemia, unspecified: Secondary | ICD-10-CM | POA: Diagnosis present

## 2024-11-02 DIAGNOSIS — Z7901 Long term (current) use of anticoagulants: Secondary | ICD-10-CM | POA: Diagnosis not present

## 2024-11-02 DIAGNOSIS — I428 Other cardiomyopathies: Secondary | ICD-10-CM | POA: Diagnosis present

## 2024-11-02 DIAGNOSIS — I5023 Acute on chronic systolic (congestive) heart failure: Secondary | ICD-10-CM | POA: Diagnosis present

## 2024-11-02 DIAGNOSIS — I4819 Other persistent atrial fibrillation: Secondary | ICD-10-CM | POA: Diagnosis present

## 2024-11-02 DIAGNOSIS — I5020 Unspecified systolic (congestive) heart failure: Secondary | ICD-10-CM | POA: Diagnosis not present

## 2024-11-02 DIAGNOSIS — Z823 Family history of stroke: Secondary | ICD-10-CM | POA: Diagnosis not present

## 2024-11-02 DIAGNOSIS — D6869 Other thrombophilia: Secondary | ICD-10-CM | POA: Diagnosis present

## 2024-11-02 DIAGNOSIS — Z79899 Other long term (current) drug therapy: Secondary | ICD-10-CM | POA: Diagnosis not present

## 2024-11-02 DIAGNOSIS — N400 Enlarged prostate without lower urinary tract symptoms: Secondary | ICD-10-CM | POA: Diagnosis present

## 2024-11-02 DIAGNOSIS — E0590 Thyrotoxicosis, unspecified without thyrotoxic crisis or storm: Secondary | ICD-10-CM | POA: Diagnosis present

## 2024-11-02 DIAGNOSIS — F419 Anxiety disorder, unspecified: Secondary | ICD-10-CM | POA: Diagnosis present

## 2024-11-02 DIAGNOSIS — I739 Peripheral vascular disease, unspecified: Secondary | ICD-10-CM | POA: Diagnosis present

## 2024-11-02 DIAGNOSIS — Z87891 Personal history of nicotine dependence: Secondary | ICD-10-CM | POA: Diagnosis not present

## 2024-11-02 DIAGNOSIS — I161 Hypertensive emergency: Secondary | ICD-10-CM | POA: Diagnosis present

## 2024-11-02 DIAGNOSIS — I4891 Unspecified atrial fibrillation: Secondary | ICD-10-CM | POA: Diagnosis not present

## 2024-11-02 DIAGNOSIS — I5022 Chronic systolic (congestive) heart failure: Secondary | ICD-10-CM | POA: Diagnosis not present

## 2024-11-02 DIAGNOSIS — Z87440 Personal history of urinary (tract) infections: Secondary | ICD-10-CM | POA: Diagnosis not present

## 2024-11-02 DIAGNOSIS — I2510 Atherosclerotic heart disease of native coronary artery without angina pectoris: Secondary | ICD-10-CM | POA: Diagnosis present

## 2024-11-02 DIAGNOSIS — F0390 Unspecified dementia without behavioral disturbance: Secondary | ICD-10-CM | POA: Diagnosis present

## 2024-11-02 DIAGNOSIS — I130 Hypertensive heart and chronic kidney disease with heart failure and stage 1 through stage 4 chronic kidney disease, or unspecified chronic kidney disease: Secondary | ICD-10-CM | POA: Diagnosis present

## 2024-11-02 DIAGNOSIS — Z86718 Personal history of other venous thrombosis and embolism: Secondary | ICD-10-CM | POA: Diagnosis not present

## 2024-11-02 HISTORY — PX: CARDIOVERSION: EP1203

## 2024-11-02 LAB — CBC
HCT: 37.6 % — ABNORMAL LOW (ref 39.0–52.0)
Hemoglobin: 11.8 g/dL — ABNORMAL LOW (ref 13.0–17.0)
MCH: 30 pg (ref 26.0–34.0)
MCHC: 31.4 g/dL (ref 30.0–36.0)
MCV: 95.7 fL (ref 80.0–100.0)
Platelets: 174 K/uL (ref 150–400)
RBC: 3.93 MIL/uL — ABNORMAL LOW (ref 4.22–5.81)
RDW: 15.2 % (ref 11.5–15.5)
WBC: 5.8 K/uL (ref 4.0–10.5)
nRBC: 0 % (ref 0.0–0.2)

## 2024-11-02 LAB — BASIC METABOLIC PANEL WITH GFR
Anion gap: 9 (ref 5–15)
BUN: 24 mg/dL — ABNORMAL HIGH (ref 8–23)
CO2: 26 mmol/L (ref 22–32)
Calcium: 8.9 mg/dL (ref 8.9–10.3)
Chloride: 106 mmol/L (ref 98–111)
Creatinine, Ser: 1.58 mg/dL — ABNORMAL HIGH (ref 0.61–1.24)
GFR, Estimated: 42 mL/min — ABNORMAL LOW (ref >60)
Glucose, Bld: 99 mg/dL (ref 70–99)
Potassium: 3.8 mmol/L (ref 3.5–5.1)
Sodium: 141 mmol/L (ref 135–145)

## 2024-11-02 SURGERY — CARDIOVERSION (CATH LAB)
Anesthesia: General

## 2024-11-02 MED ORDER — SODIUM CHLORIDE 0.9% FLUSH: 3.0000 mL | INTRAVENOUS | Status: DC | PRN

## 2024-11-02 MED ORDER — SODIUM CHLORIDE 0.9 % IV SOLN: 250.0000 mL | INTRAVENOUS | Status: DC | PRN

## 2024-11-02 MED ORDER — POTASSIUM CHLORIDE CRYS ER 20 MEQ PO TBCR
40.0000 meq | EXTENDED_RELEASE_TABLET | Freq: Once | ORAL | Status: AC
  Administered 2024-11-02: 40 meq via ORAL
  Filled 2024-11-02: qty 2

## 2024-11-02 MED ORDER — SODIUM CHLORIDE 0.9% FLUSH
3.0000 mL | Freq: Two times a day (BID) | INTRAVENOUS | Status: DC
  Administered 2024-11-02 – 2024-11-03 (×2): 3 mL via INTRAVENOUS

## 2024-11-02 MED ORDER — PROPOFOL 10 MG/ML IV BOLUS
INTRAVENOUS | Status: DC | PRN
  Administered 2024-11-02: 20 mg via INTRAVENOUS
  Administered 2024-11-02: 30 mg via INTRAVENOUS

## 2024-11-02 MED ORDER — EPHEDRINE SULFATE (PRESSORS) 25 MG/5ML IV SOSY
PREFILLED_SYRINGE | INTRAVENOUS | Status: DC | PRN
  Administered 2024-11-02: 10 mg via INTRAVENOUS

## 2024-11-02 MED ORDER — APIXABAN 5 MG PO TABS
ORAL_TABLET | ORAL | Status: AC
  Filled 2024-11-02: qty 1

## 2024-11-02 MED ORDER — SPIRONOLACTONE 12.5 MG HALF TABLET
12.5000 mg | ORAL_TABLET | Freq: Every day | ORAL | Status: DC
  Administered 2024-11-02 – 2024-11-03 (×2): 12.5 mg via ORAL
  Filled 2024-11-02 (×2): qty 1

## 2024-11-02 SURGICAL SUPPLY — 1 items: PAD DEFIB RADIO PHYSIO CONN (PAD) ×1 IMPLANT

## 2024-11-02 NOTE — Interval H&P Note (Signed)
 History and Physical Interval Note:  11/02/2024 11:30 AM  Jesus Lam  has presented today for surgery, with the diagnosis of afib.  The various methods of treatment have been discussed with the patient and family. After consideration of risks, benefits and other options for treatment, the patient has consented to  Procedure(s): CARDIOVERSION (N/A) as a surgical intervention.  The patient's history has been reviewed, patient examined, no change in status, stable for surgery.  I have reviewed the patient's chart and labs.  Questions were answered to the patient's satisfaction.     [employer]

## 2024-11-02 NOTE — CV Procedure (Signed)
    Electrical Cardioversion Procedure Note Jesus Lam [id number] 03-03-1933  Procedure: Electrical Cardioversion Indications:  Atrial Fibrillation  Time Out: Verified patient identification, verified procedure,medications/allergies/relevent history reviewed, required imaging and test results available.  Performed  Procedure Details  The patient was NPO after midnight. Anesthesia was administered at the beside  by Dr.Moser with propofol .  Cardioversion was performed with synchronized biphasic defibrillation via AP pads with 200 joules.  1 attempt(s) were performed.  The patient converted to normal sinus rhythm. The patient tolerated the procedure well   IMPRESSION:  Successful cardioversion of atrial fibrillation    Jesus Lam 11/02/2024, 12:35 PM

## 2024-11-02 NOTE — Discharge Summary (Signed)
 Discharge Summary   Patient ID: Jesus Lam MRN: [id number]; DOB: 06-19-1933  Admit date: 11/01/2024 Discharge date: 11/02/2024  PCP:  Georgina Speaks, FNP   [facility] Providers Cardiologist:  Newman JINNY Lawrence, MD     Discharge Diagnoses  Principal Problem:   Hypertensive emergency   Diagnostic Studies/Procedures   DCCV _____________   History of Present Illness   Jesus Lam is a 89 y.o. male with NICM with reduced EF, persistent atrial fibrillation, dilated aorta, CKD, hyperthyroidism, BPH, PAD with SFA stenting, DVT, recurrent CVA, who was seen 11/01/2024 for the evaluation of hypertensive emergency and acute HFrEF.  Jesus Lam has had prior cardiac catheterization 12/2017 with nonobstructive CAD and known history of severe bilateral PAD status post DCB to RT SFA and overlapping stents to that L SFA at the same time.  EF has been low since 2019 25 to 30% but has fluctuated during this year and has trended down.  02/2024 he had admission for expressive and receptive aphasia with no large vessel occlusion but he did reveal completed infarct in the superior left carotid cortex with acute left distal MCA branch infarction.  Not deemed a candidate for TNK or thrombectomy.  EF 45 to 50% at that time.  Got a heart monitor 05/2024 showing 100% atrial flutter.  Again 05/2024 seen in the ED for aphasia/dysarthria, MRI with no new intracranial abnormalities. 07/2024 nonischemic Myoview .  08/2024 admitted for CHF exacerbation.  EF showed further drop 25 to 30%.  GDMT limited by hypotension, did not tolerate Entresto  or spironolactone .  During that admission he was started on Jardiance  but had UTI so this was subsequently stopped.  He was discharged on losartan  25 mg and carvedilol  3.125 mg twice daily.  Since then he has been seen outpatient and with plans of ablation but needed DCCV first.  Seen outpatient 12/4 and reported exertional dyspnea, Lasix  increased from 20 mg to 40 mg.    Presented on 12/8 for outpatient DCCV prior to procedure he started demonstrating concerning features witnessed aphasia that resolved within a few minutes and had hypertensive emergency with blood pressure around 160s over 120s.  Code stroke was called, CT negative.  Neurology felt like episode was recrudescence of old stroke symptoms in the setting of severe anxiety.  Did not recommend further imaging and signed off.  In the meantime he was given 1 dose of IV Lopressor  5 mg.  BP at the bedside improved, 130/80.   Patient and daughter reported 100% compliancy with all of his medications and has not missed any doses of the Eliquis .  Daughter gives him all of his medications.  They reported since his discharge he had progressive weight gain and worsening exertional dyspnea with orthopnea.  Weight had steadily increased.  Discharge weight 08/2024 was 240.  They reported that he is around 255 now.  Despite increase of Lasix  to 40 mg has not had any meaningful improvement in symptoms. He was admitted for further management.   Hospital Course   Consultants: neurology   Persistent atrial fibrillation --   Acute on chronic HFrEF NICM --  HTN --   Did the patient have an acute coronary syndrome (MI, NSTEMI, STEMI, etc) this admission?:  {Press F2   :789639499}   {Are you ordering a CV Procedure (e.g. stress test, cath, DCCV, TEE, etc) to be done as an outpatient?   Press F2        :[id number]}   {Does this patient need a TOC appointment?:[id number]} _____________  Discharge Vitals Blood pressure 132/84, pulse 80, temperature 97.6 F (36.4 C), temperature source Oral, resp. rate 20, height 6' (1.829 m), weight 110.5 kg, SpO2 93%.  Filed Weights   11/01/24 1647 11/02/24 0539  Weight: 114.4 kg 110.5 kg    Labs & Radiologic Studies  CBC Recent Labs    11/01/24 1452 11/02/24 0257  WBC 6.1 5.8  HGB 12.3* 11.8*  HCT 39.4 37.6*  MCV 94.7 95.7  PLT 188 174   Basic Metabolic Panel Recent  Labs    11/01/24 1452 11/02/24 0257  NA 142 141  K 4.4 3.8  CL 105 106  CO2 30 26  GLUCOSE 114* 99  BUN 23 24*  CREATININE 1.46* 1.58*  CALCIUM  9.2 8.9   Liver Function Tests Recent Labs    11/01/24 1452  AST 18  ALT 15  ALKPHOS 70  BILITOT 1.2  PROT 8.1  ALBUMIN 3.2*   No results for input(s): LIPASE, AMYLASE in the last 72 hours. High Sensitivity Troponin:   No results for input(s): TROPONINIHS in the last 720 hours.  No results for input(s): TRNPT in the last 720 hours.  BNP Invalid input(s): POCBNP No results for input(s): PROBNP in the last 72 hours.  Recent Labs    11/01/24 1452  BNP 106.3*    D-Dimer No results for input(s): DDIMER in the last 72 hours. Hemoglobin A1C No results for input(s): HGBA1C in the last 72 hours. Fasting Lipid Panel No results for input(s): CHOL, HDL, LDLCALC, TRIG, CHOLHDL, LDLDIRECT in the last 72 hours. No results found for: LIPOA  Thyroid  Function Tests No results for input(s): TSH, T4TOTAL, T3FREE, THYROIDAB in the last 72 hours.  Invalid input(s): FREET3 _____________  EP STUDY Result Date: 11/02/2024 See surgical note for result.  DG CHEST PORT 1 VIEW Result Date: 11/01/2024 CLINICAL DATA:  Congestive heart failure EXAM: PORTABLE CHEST 1 VIEW COMPARISON:  Chest radiograph dated 09/06/2024 FINDINGS: Patient is rotated slightly to the right. Normal lung volumes. Mild bibasilar patchy opacities. No pleural effusion or pneumothorax. Similar enlarged cardiomediastinal silhouette. No acute osseous abnormality. IMPRESSION: 1. Mild bibasilar patchy opacities, likely atelectasis. 2. Similar cardiomegaly. Electronically Signed   By: Limin  Xu M.D.   On: 11/01/2024 17:34   CT HEAD CODE STROKE WO CONTRAST` Result Date: 11/01/2024 EXAM: CT HEAD WITHOUT 11/01/2024 10:36:56 AM TECHNIQUE: CT of the head was performed without the administration of intravenous contrast. Automated exposure control,  iterative reconstruction, and/or weight based adjustment of the mA/kV was utilized to reduce the radiation dose to as low as reasonably achievable. COMPARISON: CT of the head dated 06/03/2024. CLINICAL HISTORY: Neuro deficit, acute, stroke suspected. FINDINGS: BRAIN AND VENTRICLES: Chronic encephalomalacia changes are again demonstrated within the left parietal and occipital lobes. A chronic infarct is also present within the right cerebellar hemisphere. Calcifications are present within the carotid siphons and vertebral arteries. No acute intracranial hemorrhage. No mass effect or midline shift. No extra-axial fluid collection. No evidence of acute infarct. No hydrocephalus. ORBITS: Status post bilateral lens replacement. No acute abnormality. SINUSES AND MASTOIDS: A polypoid mucosal density is present in the right maxillary sinus. SOFT TISSUES AND SKULL: No acute skull fracture. No acute soft tissue abnormality. [place] Stroke Program Early CT Score (ASPECTS) ----- Ganglionic (caudate, IC, lentiform nucleus, insula, M1-M3): 7 Supraganglionic (M4-M6): 3 Total: 10 IMPRESSION: 1. No acute intracranial abnormality related to the suspected stroke. 2. Chronic encephalomalacia changes in the left parietal and occipital lobes and a chronic infarct in the right cerebellar  hemisphere. 3. ASPECTS: 10. Electronically signed by: Evalene Coho MD 11/01/2024 10:44 AM EST RP Workstation: [id number]   EP STUDY Result Date: 11/01/2024 See surgical note for result.  ECHOCARDIOGRAM COMPLETE Result Date: 10/14/2024    ECHOCARDIOGRAM REPORT   Patient Name:   LOTTIE SISKA Date of Exam: 10/14/2024 Medical Rec #:  [id number]       Height:       72.0 in Accession #:    [id number]      Weight:       256.0 lb Date of Birth:  01/30/1933        BSA:          2.366 m Patient Age:    89 years        BP:           120/60 mmHg Patient Gender: M               HR:           80 bpm. Exam Location:  [address] Procedure: 2D Echo and  Intracardiac Opacification Agent (Both Spectral and Color            Flow Doppler were utilized during procedure). Indications:    I50.20* Unspecified systolic (congestive) heart failure  History:        Patient has prior history of Echocardiogram examinations, most                 recent 09/08/2024. PAD and Stroke, Arrythmias:Atrial Flutter,                 Signs/Symptoms:Fatigue; Risk Factors:Hypertension and                 Dyslipidemia. Precordial pain. DVT. First degree AV block.                 Hyperthyroidism. Chronic kidney disease.  Sonographer:    Jon Hacker RCS Referring Phys: [id number] [facility] J PATWARDHAN IMPRESSIONS  1. Left ventricular ejection fraction, by estimation, is 25 to 30%. Left ventricular ejection fraction by 3D volume is 29 %. The left ventricle has severely decreased function. The left ventricle demonstrates global hypokinesis. There is mild concentric  left ventricular hypertrophy. Left ventricular diastolic function could not be evaluated.  2. Right ventricular systolic function is moderately reduced. The right ventricular size is normal.  3. Left atrial size was moderately dilated.  4. Right atrial size was moderately dilated.  5. The mitral valve is normal in structure. Mild mitral valve regurgitation. No evidence of mitral stenosis.  6. The aortic valve is tricuspid. There is moderate calcification of the aortic valve. Aortic valve regurgitation is mild. Moderate aortic valve stenosis. Aortic valve area, by VTI measures 1.10 cm. Aortic valve mean gradient measures 8.2 mmHg. Aortic valve Vmax measures 1.94 m/s.  7. Aortic dilatation noted. There is mild dilatation of the aortic root, measuring 41 mm. There is moderate dilatation of the ascending aorta, measuring 44 mm.  8. The inferior vena cava is normal in size with greater than 50% respiratory variability, suggesting right atrial pressure of 3 mmHg. FINDINGS  Left Ventricle: Left ventricular ejection fraction, by estimation, is  25 to 30%. Left ventricular ejection fraction by 3D volume is 29 %. The left ventricle has severely decreased function. The left ventricle demonstrates global hypokinesis. The left ventricular internal cavity size was normal in size. There is mild concentric left ventricular hypertrophy. Left ventricular diastolic function could not be evaluated due to atrial fibrillation. Left ventricular  diastolic function could not be evaluated. Right Ventricle: The right ventricular size is normal. No increase in right ventricular wall thickness. Right ventricular systolic function is moderately reduced. Left Atrium: Left atrial size was moderately dilated. Right Atrium: Right atrial size was moderately dilated. Pericardium: There is no evidence of pericardial effusion. Mitral Valve: The mitral valve is normal in structure. Mild mitral valve regurgitation. No evidence of mitral valve stenosis. Tricuspid Valve: The tricuspid valve is normal in structure. Tricuspid valve regurgitation is mild . No evidence of tricuspid stenosis. Aortic Valve: The aortic valve is tricuspid. There is moderate calcification of the aortic valve. Aortic valve regurgitation is mild. Moderate aortic stenosis is present. Aortic valve mean gradient measures 8.2 mmHg. Aortic valve peak gradient measures 15.1 mmHg. Aortic valve area, by VTI measures 1.10 cm. Pulmonic Valve: The pulmonic valve was normal in structure. Pulmonic valve regurgitation is not visualized. No evidence of pulmonic stenosis. Aorta: Aortic dilatation noted. There is mild dilatation of the aortic root, measuring 41 mm. There is moderate dilatation of the ascending aorta, measuring 44 mm. Venous: The inferior vena cava is normal in size with greater than 50% respiratory variability, suggesting right atrial pressure of 3 mmHg. IAS/Shunts: No atrial level shunt detected by color flow Doppler. Additional Comments: 3D was performed not requiring image post processing on an independent  workstation and was abnormal.  LEFT VENTRICLE PLAX 2D LVIDd:         5.38 cm LVIDs:         4.73 cm LV PW:         1.26 cm         3D Volume EF LV IVS:        1.23 cm         LV 3D EF:    Left LVOT diam:     2.00 cm                      ventricul LV SV:         39                           ar LV SV Index:   16                           ejection LVOT Area:     3.14 cm                     fraction                                             by 3D                                             volume is                                             29 %.  3D Volume EF:                                3D EF:        29 %                                LV EDV:       282 ml                                LV ESV:       202 ml                                LV SV:        81 ml RIGHT VENTRICLE RV Basal diam:  4.22 cm RV S prime:     9.78 cm/s TAPSE (M-mode): 1.0 cm LEFT ATRIUM             Index        RIGHT ATRIUM           Index LA diam:        4.60 cm 1.94 cm/m   RA Area:     28.40 cm LA Vol (A2C):   71.1 ml 30.05 ml/m  RA Volume:   97.90 ml  41.37 ml/m LA Vol (A4C):   78.6 ml 33.22 ml/m LA Biplane Vol: 81.0 ml 34.23 ml/m  AORTIC VALVE AV Area (Vmax):    1.10 cm AV Area (Vmean):   1.07 cm AV Area (VTI):     1.10 cm AV Vmax:           194.00 cm/s AV Vmean:          131.250 cm/s AV VTI:            0.352 m AV Peak Grad:      15.1 mmHg AV Mean Grad:      8.2 mmHg LVOT Vmax:         68.17 cm/s LVOT Vmean:        44.567 cm/s LVOT VTI:          0.123 m LVOT/AV VTI ratio: 0.35  AORTA Ao Root diam: 4.10 cm Ao Asc diam:  4.40 cm  SHUNTS Systemic VTI:  0.12 m Systemic Diam: 2.00 cm Toribio Fuel MD Electronically signed by Toribio Fuel MD Signature Date/Time: 10/14/2024/11:03:24 PM    Final     Disposition Pt is being discharged home today in good condition.  Follow-up Plans & Appointments    Discharge Medications Allergies as of 11/02/2024       Reactions   Porcine (pork)  Protein-containing Drug Products Other (See Comments)   Religious    Shellfish Allergy Other (See Comments)   Gout      Med Rec must be completed prior to using this [facility]***        Outstanding Labs/Studies ***  Duration of Discharge Encounter: APP Time: *** minutes   Signed, Manuelita Rummer, NP 11/02/2024, 1:28 PM

## 2024-11-02 NOTE — Progress Notes (Signed)
  Progress Note  Patient Name: Jesus Lam Date of Encounter: 11/02/2024 [facility] Cardiologist: Newman JINNY Lawrence, MD   Interval Summary    Back from DCCV, feels well.   Vital Signs Vitals:   11/02/24 1250 11/02/24 1300 11/02/24 1305 11/02/24 1318  BP: (!) 164/95 124/76 (!) 116/96 132/84  Pulse: 85 77 80   Resp: (!) 29 (!) 22 (!) 24 20  Temp:    97.6 F (36.4 C)  TempSrc:    Oral  SpO2: 96% 96% 93% 93%  Weight:      Height:        Intake/Output Summary (Last 24 hours) at 11/02/2024 1349 Last data filed at 11/02/2024 0900 Gross per 24 hour  Intake 600 ml  Output 3500 ml  Net -2900 ml      11/02/2024    5:39 AM 11/01/2024    4:47 PM 10/28/2024    2:04 PM  Last 3 Weights  Weight (lbs) 243 lb 11.2 oz 252 lb 3.2 oz 255 lb 12.8 oz  Weight (kg) 110.542 kg 114.397 kg 116.03 kg      Telemetry/ECG   2nd degree ACVB type I - Personally Reviewed  Physical Exam  GEN: No acute distress.   Neck: No JVD Cardiac: RRR, no murmurs, rubs, or gallops.  Respiratory: Clear to auscultation bilaterally. GI: Soft, nontender, non-distended  MS: No edema  Assessment & Plan   89 y.o. male with NICM with reduced EF, persistent atrial fibrillation, dilated aorta, CKD, hyperthyroidism, BPH, PAD with SFA stenting, DVT, recurrent CVA, who was seen 11/01/2024 for the evaluation of hypertensive emergency and acute HFrEF.   Persistent atrial fibrillation -- presented for outpatient DCCV 12/8 which was deferred in the setting of neurological symptoms -- successful DCCV today -- noted in 2nd degree AVB type I, continue to hold coreg  for now -- continue Eliquis  5mg  BID  Acute on Chronic HFrEF NICM -- Echo 09/2024 EF 25 to 30%, global hypokinesis.  Moderately reduced RV.  Moderately dilated atria.  Mild MR/AR  -- reported progressive weight gain prior to admission despite compliance with meds -- s/p IV lasix  80mg  BID, net - 2.9L -- GDMT: previously limited by hypotension and  with failure to tolerate Entresto . Had UTI on Jardiance . Holding coreg  with 2nd degree AVB type I, continue losartan . Will re-trial spiro 12.5mg  daily  HTN -- initially hypertensive, now controlled -- continue losartan  25mg  daily   History of recurrent CVAs -- CODE stroke called while in the holding area on 12/8. Neurology has signed off, follow-up outpatient.  Has chronic CVA like symptoms at baseline. Continue atorvastatin  80 mg daily.  Hyperthyroidism  -- Previously elevated TSH 7.5 but with normal T4.  He has been on methimazole  chronically.   UTI -- recently seen by PCP and Rx for Levaquin   Will have he ambulate today and recheck renal function in the morning  For questions or updates, please contact [facility] Please consult [email or web address] for contact info under   Signed, Manuelita Rummer, NP

## 2024-11-02 NOTE — Plan of Care (Signed)

## 2024-11-02 NOTE — Transfer of Care (Signed)
 Immediate Anesthesia Transfer of Care Note  Patient: Jesus Lam  Procedure(s) Performed: CARDIOVERSION  Patient Location: Cath Lab  Anesthesia Type:General  Level of Consciousness: drowsy and patient cooperative  Airway & Oxygen Therapy: Patient Spontanous Breathing and Patient connected to nasal cannula oxygen  Post-op Assessment: Report given to RN and Post -op Vital signs reviewed and stable  Post vital signs: Reviewed and stable  Last Vitals:  Vitals Value Taken Time  BP 133/90 11/02/2024 12:17  Temp 37 C 11/02/2024 12:17  Pulse 71 11/02/2024 12:17  Resp 14 11/02/2024 12:17  SpO2 92% 11/02/2024 12:17    Last Pain:  Vitals:   11/02/24 0856  TempSrc: Temporal  PainSc:       Patients Stated Pain Goal: 0 (11/01/24 1647)  Complications: No notable events documented.

## 2024-11-02 NOTE — TOC Initial Note (Signed)
 Transition of Care [facility]) - Initial/Assessment Note    Patient Details  Name: Jesus Lam MRN: [id number] Date of Birth: November 17, 1933  [facility] of [facility]) CM/SW Contact:    Sudie Erminio Deems, RN Phone Number: 11/02/2024, 12:49 PM  Clinical Narrative: Patient presented for hypertension emergency. PTA patient was from home alone. Patient has support of daughter that stays in the same apartment complex. Patient has personal care services 2.5 hours a day 5 days a week with [facility]. Daughter was at the bedside during the visit and she states she takes the patient to appointments.  Patient has DME: cane, rolling waker, bedside commode, and shower chair. Patient is currently active with [facility] for RN/PT/OT/SLP. ICM will continue to follow for additional needs as the patient progresses.                 Expected Discharge Plan: Home w Home Health Services Barriers to Discharge: Continued Medical Work up   Patient Goals and CMS Choice Patient states their goals for this hospitalization and ongoing recovery are:: Plan to return home with [facility]  Expected Discharge Plan and Services   Discharge Planning Services: CM Consult Post Acute Care Choice: Home Health, Resumption of Svcs/PTA Provider Living arrangements for the past 2 months: Apartment Expected Discharge Date: 10/19/24                 DME Agency: NA     HH Arranged: RN, Disease Management, PT, OT, Speech Therapy HH Agency: [facility] Date [facility] Agency Contacted: 11/02/24 Time HH Agency Contacted: [id number] Representative spoke with at [facility] Agency: Arna  Prior Living Arrangements/Services Living arrangements for the past 2 months: Apartment Lives with:: Self (daughter lives in the same apartment complex) Patient language and need for interpreter reviewed:: Yes Do you feel safe going back to the place where you live?: Yes      Need for Family Participation in Patient Care: Yes (Comment) Care  giver support system in place?: Yes (comment) Current home services: DME, Homehealth aide (cane, rolling waker, bedside commode, and shower chair.) Criminal Activity/Legal Involvement Pertinent to Current Situation/Hospitalization: No - Comment as needed  Activities of Daily Living   ADL Screening (condition at time of admission) Independently performs ADLs?: Yes (appropriate for developmental age) Is the patient deaf or have difficulty hearing?: No Does the patient have difficulty seeing, even when wearing glasses/contacts?: No Does the patient have difficulty concentrating, remembering, or making decisions?: No  Permission Sought/Granted Permission sought to share information with : Family Supports, Magazine Features Editor, Case Estate Manager/land Agent granted to share information with : Yes, Verbal Permission Granted     Permission granted to share info w AGENCY: [facility]        Emotional Assessment Appearance:: Appears stated age       Alcohol / Substance Use: Not Applicable Psych Involvement: No (comment)  Admission diagnosis:  Hypertensive emergency [I16.1] Patient Active Problem List   Diagnosis Date Noted   Hypertensive emergency 11/01/2024   Acute respiratory failure with hypoxia (HCC) 09/07/2024   Acute on chronic systolic CHF (congestive heart failure), NYHA class 3 (HCC) 09/06/2024   Typical atrial flutter (HCC) 07/14/2024   HFrEF (heart failure with reduced ejection fraction) (HCC) 07/14/2024   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 stage 3a chronic kidney disease (HCC) 06/30/2024   Decreased appetite 06/30/2024   Expressive aphasia 04/23/2024   Chronic HFrEF (heart failure with reduced ejection fraction) (HCC)  03/23/2024   Benign hypertensive heart and kidney disease with congestive heart failure and stage 3 chronic kidney disease (HCC) 08/26/2023   Does use hearing aid 06/17/2023   Bilateral hearing loss 06/17/2023    Obesity, Class I, BMI 30-34.9 06/17/2023   CKD stage 3a, GFR 45-59 ml/min (HCC) - baseline Scr 1.1-1.3 01/20/2023   Presbycusis of both ears 11/01/2021   Leg edema 08/01/2021   Mild cognitive impairment 09/07/2020   Chronic pain of left knee 03/15/2020   Pure hypercholesterolemia 11/15/2019   First degree AV block 07/28/2019   Hypertriglyceridemia 04/29/2019   Mixed hyperlipidemia 04/29/2019   Hyperthyroidism 11/18/2018   PAD (peripheral artery disease) 06/13/2018   Dysarthria as late effect of cerebellar cerebrovascular accident (CVA) 06/12/2018   Claudication 01/11/2018   Anemia 10/10/2017   Essential hypertension 11/04/2013   PCP:  Georgina Speaks, FNP Pharmacy:   [facility] - [place], [place] - [address] [facility] [address] [place] [address] Phone: [phone] Fax: [phone]  [facility] - [place], [place] - [address] [place] [address] Phone: [phone] Fax: [phone]     Social Drivers of Health (SDOH) Social History: SDOH Screenings   Food Insecurity: No Food Insecurity (11/01/2024)  Housing: Low Risk  (11/01/2024)  Transportation Needs: No Transportation Needs (11/01/2024)  Utilities: Not At Risk (11/01/2024)  Alcohol Screen: Low Risk  (06/16/2024)  Depression (PHQ2-9): Low Risk  (06/16/2024)  Financial Resource Strain: Low Risk  (06/16/2024)  Physical Activity: Inactive (06/16/2024)  Social Connections: Moderately Integrated (11/01/2024)  Stress: No Stress Concern Present (06/16/2024)  Tobacco Use: Medium Risk (11/01/2024)  Health Literacy: Adequate Health Literacy (06/16/2024)   SDOH Interventions:     Readmission Risk Interventions     No data to display

## 2024-11-02 NOTE — Progress Notes (Signed)
 Family at bedside awaiting patient return. Updated on patient status.

## 2024-11-02 NOTE — Care Management Obs Status (Signed)
 MEDICARE OBSERVATION STATUS NOTIFICATION   Patient Details  Name: Jesus Lam MRN: [id number] Date of Birth: 09-10-1933   Medicare Observation Status Notification Given:  Yes    [name] 11/02/2024, 8:49 AM

## 2024-11-03 ENCOUNTER — Other Ambulatory Visit (HOSPITAL_COMMUNITY): Payer: Self-pay

## 2024-11-03 ENCOUNTER — Encounter (HOSPITAL_COMMUNITY): Payer: Self-pay | Admitting: Cardiology

## 2024-11-03 ENCOUNTER — Other Ambulatory Visit: Payer: Self-pay

## 2024-11-03 DIAGNOSIS — I4891 Unspecified atrial fibrillation: Secondary | ICD-10-CM | POA: Insufficient documentation

## 2024-11-03 LAB — CBC
HCT: 37.2 % — ABNORMAL LOW (ref 39.0–52.0)
Hemoglobin: 11.5 g/dL — ABNORMAL LOW (ref 13.0–17.0)
MCH: 29.3 pg (ref 26.0–34.0)
MCHC: 30.9 g/dL (ref 30.0–36.0)
MCV: 94.9 fL (ref 80.0–100.0)
Platelets: 168 K/uL (ref 150–400)
RBC: 3.92 MIL/uL — ABNORMAL LOW (ref 4.22–5.81)
RDW: 15.4 % (ref 11.5–15.5)
WBC: 6 K/uL (ref 4.0–10.5)
nRBC: 0 % (ref 0.0–0.2)

## 2024-11-03 LAB — BASIC METABOLIC PANEL WITH GFR
Anion gap: 8 (ref 5–15)
BUN: 28 mg/dL — ABNORMAL HIGH (ref 8–23)
CO2: 27 mmol/L (ref 22–32)
Calcium: 8.7 mg/dL — ABNORMAL LOW (ref 8.9–10.3)
Chloride: 105 mmol/L (ref 98–111)
Creatinine, Ser: 1.49 mg/dL — ABNORMAL HIGH (ref 0.61–1.24)
GFR, Estimated: 45 mL/min — ABNORMAL LOW (ref >60)
Glucose, Bld: 106 mg/dL — ABNORMAL HIGH (ref 70–99)
Potassium: 3.8 mmol/L (ref 3.5–5.1)
Sodium: 140 mmol/L (ref 135–145)

## 2024-11-03 MED ORDER — SPIRONOLACTONE 25 MG PO TABS
12.5000 mg | ORAL_TABLET | Freq: Every day | ORAL | 0 refills | Status: DC
  Filled 2024-11-03 (×3): qty 45, 90d supply, fill #0

## 2024-11-03 NOTE — Plan of Care (Signed)
 Pt is alert and oriented x 4, stable hemodynamically, afebrile, normal respiratory effort. NSR on the monitor. He is able to rest and sleep well with no major complaints overnight. Plan of care is reviewed. Pt has been progressing. We will continue to monitor.  Problem: Clinical Measurements: Goal: Ability to maintain clinical measurements within normal limits will improve Outcome: Progressing Goal: Will remain free from infection Outcome: Progressing Goal: Diagnostic test results will improve Outcome: Progressing Goal: Respiratory complications will improve Outcome: Progressing Goal: Cardiovascular complication will be avoided Outcome: Progressing   Problem: Health Behavior/Discharge Planning: Goal: Ability to manage health-related needs will improve Outcome: Progressing   Problem: Activity: Goal: Risk for activity intolerance will decrease Outcome: Progressing   Problem: Elimination: Goal: Will not experience complications related to bowel motility Outcome: Progressing Goal: Will not experience complications related to urinary retention Outcome: Progressing   Problem: Safety: Goal: Ability to remain free from injury will improve Outcome: Progressing   Problem: Skin Integrity: Goal: Risk for impaired skin integrity will decrease Outcome: Progressing   Wendi Dash, RN

## 2024-11-03 NOTE — Progress Notes (Signed)
°   11/02/24 2343  Mobility (See group info for [facility] equipment and weight capacities recommendations)  HOB Elevated/Bed Position Self regulated  Activity Turned to back - supine;Turned to left side;Turned to right side;Pivoted/transferred from chair to bed  Range of Motion/Exercises Active Assistive;All extremities  Level of Assistance Moderate assist, patient does 50-74%  Assistive Device Front wheel walker  Distance Ambulated (ft) 6 ft  Activity Response Tolerated well  Transport method Ambulatory   Suann Dash, RN

## 2024-11-04 ENCOUNTER — Telehealth: Payer: Self-pay

## 2024-11-04 ENCOUNTER — Telehealth: Admitting: Nurse Practitioner

## 2024-11-04 ENCOUNTER — Encounter: Payer: Self-pay | Admitting: Nurse Practitioner

## 2024-11-04 VITALS — BP 128/86 | Wt 244.0 lb

## 2024-11-04 DIAGNOSIS — I161 Hypertensive emergency: Secondary | ICD-10-CM

## 2024-11-04 DIAGNOSIS — E876 Hypokalemia: Secondary | ICD-10-CM

## 2024-11-04 DIAGNOSIS — Z09 Encounter for follow-up examination after completed treatment for conditions other than malignant neoplasm: Secondary | ICD-10-CM

## 2024-11-04 MED ORDER — SPIRONOLACTONE 25 MG PO TABS: 12.5000 mg | ORAL_TABLET | Freq: Every day | ORAL | 2 refills | Status: AC

## 2024-11-04 NOTE — Progress Notes (Unsigned)
 Virtual Visit via Video Note  [name], CMA,acting as a scribe for Jesus Ada, FNP.,have documented all relevant documentation on the behalf of Jesus Ada, FNP,as directed by  Jesus Ada, FNP while in the presence of Jesus Ada, FNP.  I connected with [name] on 11/04/2024 at  3:20 PM EST by a video enabled telemedicine application and verified that I am speaking with the correct person using two identifiers.  {Patient Location:[phone]::Home} {Provider Location:[id number]::Home Office}  I discussed the limitations, risks, security, and privacy concerns of performing an evaluation and management service by video and the availability of in person appointments. I also discussed with the patient that there may be a patient responsible charge related to this service. The patient expressed understanding and agreed to proceed.  Subjective: PCP: Lam Gaines, FNP  Chief Complaint  Patient presents with   Hospitalization Follow-up    Patient presents today for a hospital follow up, patient reports compliance with medications. Patient denies any chest pain, SOB, or headaches. Patient was admitted 11/01/2024 and discharged on 11/03/2024. Diagnosed with Hypertensive emergency Patient reports today they are feeling better.   When he went for the cardioversion he had a panic attack. He started stuttering with his speech and when they came with the equipment. Kept for observation. He had his cardioversion and remained for another 24 hours. They diuresed him and is doing better. Blood pressure was low yesterday but okay today. Continues to have PT and SN.        ROS: Per HPI Current Medications[1]  Observations/Objective: Today's Vitals   11/04/24 1518  BP: 128/86  Weight: 244 lb (110.7 kg)   Physical Exam  Assessment and Plan: Hospital discharge follow-up  Hypertensive emergency    Follow Up Instructions: No follow-ups on file.   I discussed the  assessment and treatment plan with the patient. The patient was provided an opportunity to ask questions, and all were answered. The patient agreed with the plan and demonstrated an understanding of the instructions.   The patient was advised to call back or seek an in-person evaluation if the symptoms worsen or if the condition fails to improve as anticipated.  The above assessment and management plan was discussed with the patient. The patient verbalized understanding of and has agreed to the management plan.   LILLETTE Jesus Ada, FNP, have reviewed all documentation for this visit. The documentation on 11/04/2024 for the exam, diagnosis, procedures, and orders are all accurate and complete.        [1]  Current Outpatient Medications:    apixaban  (ELIQUIS ) 5 MG TABS tablet, Take 1 tablet (5 mg total) by mouth 2 (two) times daily., Disp: 180 tablet, Rfl: 3   atorvastatin  (LIPITOR ) 80 MG tablet, Take 1 tablet (80 mg total) by mouth daily., Disp: 90 tablet, Rfl: 3   azelastine  (ASTELIN ) 0.1 % nasal spray, Place 2 sprays into both nostrils 2 (two) times daily. Use in each nostril as directed (Patient taking differently: Place 2 sprays into both nostrils 2 (two) times daily as needed. Use in each nostril as directed), Disp: 30 mL, Rfl: 5   Cholecalciferol  (VITAMIN D3) 5000 units CAPS, Take 5,000 Units by mouth daily., Disp: , Rfl:    diclofenac  Sodium (VOLTAREN ) 1 % GEL, Apply 2 g topically 4 (four) times daily. APPLY TWO GRAMS TOPICALLY FOUR TIMES A DAY (Patient taking differently: Apply 2 g topically 2 (two) times daily. APPLY TWO GRAMS TOPICALLY FOUR TIMES A DAY), Disp: 100 g, Rfl: 1  furosemide  (LASIX ) 40 MG tablet, Take 1 tablet (40 mg total) by mouth daily., Disp: 90 tablet, Rfl: 3   GNP 8 HOUR ARTHRITIS RELIEF 650 MG CR tablet, TAKE ONE TABLET BY MOUTH TWICE A DAY, Disp: 60 tablet, Rfl: 0   levofloxacin  (LEVAQUIN ) 250 MG tablet, Take 1 tablet (250 mg total) by mouth daily for 7 days.,  Disp: 7 tablet, Rfl: 0   losartan  (COZAAR ) 25 MG tablet, Take 1 tablet (25 mg total) by mouth daily., Disp: 90 tablet, Rfl: 3   methimazole  (TAPAZOLE ) 10 MG tablet, Take 1 tablet (10 mg total) by mouth daily., Disp: 30 tablet, Rfl: 6   mirtazapine  (REMERON ) 7.5 MG tablet, TAKE 1 TABLET (7.5 MG TOTAL) BY MOUTH AT BEDTIME., Disp: 30 tablet, Rfl: 0   Multiple Vitamins-Minerals (COMPLETE SENIOR PO), 1 tablet daily. , Disp: , Rfl:    spironolactone  (ALDACTONE ) 25 MG tablet, Take 1/2 tablet (12.5 mg total) by mouth daily., Disp: 90 tablet, Rfl: 0   tamsulosin  (FLOMAX ) 0.4 MG CAPS capsule, TAKE ONE CAPSULE BY MOUTH DAILY HALF HOUR FOLLOWING THE SAME MEAL DAILY (Patient taking differently: Take 0.4 mg by mouth daily. TAKE ONE CAPSULE BY MOUTH DAILY HALF HOUR FOLLOWING THE SAME MEAL DAILY), Disp: 90 capsule, Rfl: 4   vitamin B-12 (CYANOCOBALAMIN ) 500 MCG tablet, Take 500 mcg by mouth daily., Disp: , Rfl:    vitamin C  (ASCORBIC ACID ) 500 MG tablet, Take 500 mg daily by mouth., Disp: , Rfl:

## 2024-11-04 NOTE — Transitions of Care (Post Inpatient/ED Visit) (Signed)
° °  11/04/2024  Name: Anthonio Mizzell MRN: [id number] DOB: [birth year]  Today's TOC FU Call Status: Today's TOC FU Call Status:: Unsuccessful Call (1st Attempt) Unsuccessful Call (1st Attempt) Date: 11/04/24  Attempted to reach the patient regarding the most recent Inpatient/ED visit.  Follow Up Plan: Additional outreach attempts will be made to reach the patient to complete the Transitions of Care (Post Inpatient/ED visit) call.   Shona Prow RN, CCM [place]  VBCI-Population Health RN Care Manager [phone]

## 2024-11-05 ENCOUNTER — Telehealth: Payer: Self-pay | Admitting: *Deleted

## 2024-11-05 NOTE — Transitions of Care (Post Inpatient/ED Visit) (Signed)
 11/05/2024  Name: Jesus Lam MRN: [id number] DOB: 1932-11-30  Today's TOC FU Call Status: Today's TOC FU Call Status:: Successful TOC FU Call Completed TOC FU Call Complete Date: 11/05/24  Patient's Name and Date of Birth confirmed. Name, DOB (Verified with DPR)  Transition Care Management Follow-up Telephone Call Date of Discharge: 11/03/24 Discharge Facility: [facility]) Type of Discharge: Inpatient Admission Primary Inpatient Discharge Diagnosis:: Hypertensive emergency How have you been since you were released from the hospital?: Better (Per DPR) Any questions or concerns?: No  Items Reviewed: Did you receive and understand the discharge instructions provided?: Yes (Per DPR) Medications obtained,verified, and reconciled?: Yes (Medications Reviewed) Any new allergies since your discharge?: No Dietary orders reviewed?: Yes Type of Diet Ordered:: Low sodium, Heart Healthy Do you have support at home?: Yes People in Home [RPT]: alone, child(ren), adult Name of Support/Comfort Primary Source: Daughter/[name] lives close by  Medications Reviewed Today: Medications Reviewed Today     Reviewed by Lucky Andrea LABOR, RN (Registered Nurse) on 11/05/24 at 1414  Med List Status: <None>   Medication Order Taking? Sig Documenting Provider Last Dose Status Informant  apixaban  (ELIQUIS ) 5 MG TABS tablet [id number] Yes Take 1 tablet (5 mg total) by mouth 2 (two) times daily. Patwardhan, Newman PARAS, MD  Active   atorvastatin  (LIPITOR ) 80 MG tablet [id number] Yes Take 1 tablet (80 mg total) by mouth daily. Patwardhan, Newman PARAS, MD  Active   azelastine  (ASTELIN ) 0.1 % nasal spray [id number] Yes Place 2 sprays into both nostrils 2 (two) times daily. Use in each nostril as directed  Patient taking differently: Place 2 sprays into both nostrils 2 (two) times daily as needed. Use in each nostril as directed   Georgina Speaks, FNP  Active Child  Cholecalciferol  (VITAMIN D3) 5000 units CAPS  [id number] Yes Take 5,000 Units by mouth daily. [provider]  Active Child  diclofenac  Sodium (VOLTAREN ) 1 % GEL [id number] Yes Apply 2 g topically 4 (four) times daily. APPLY TWO GRAMS TOPICALLY FOUR TIMES A DAY  Patient taking differently: Apply 2 g topically 2 (two) times daily. APPLY TWO GRAMS TOPICALLY FOUR TIMES A DAY   Georgina Speaks, FNP  Active Child  furosemide  (LASIX ) 40 MG tablet [id number] Yes Take 1 tablet (40 mg total) by mouth daily. [place] Newman PARAS, MD  Active   GNP 8 HOUR ARTHRITIS RELIEF 650 MG CR tablet [id number] Yes TAKE ONE TABLET BY MOUTH TWICE A DAY Georgina Speaks, FNP  Active Child  levofloxacin  (LEVAQUIN ) 250 MG tablet [id number] Yes Take 1 tablet (250 mg total) by mouth daily for 7 days. Georgina Speaks, FNP  Active   losartan  (COZAAR ) 25 MG tablet [id number] Yes Take 1 tablet (25 mg total) by mouth daily. Patwardhan, Newman PARAS, MD  Active   methimazole  (TAPAZOLE ) 10 MG tablet [id number] Yes Take 1 tablet (10 mg total) by mouth daily. Shamleffer, Ibtehal Jaralla, MD  Active Child  mirtazapine  (REMERON ) 7.5 MG tablet [id number] Yes TAKE 1 TABLET (7.5 MG TOTAL) BY MOUTH AT BEDTIME. Georgina Speaks, FNP  Active Child  Multiple Vitamins-Minerals (COMPLETE SENIOR PO) [id number] Yes 1 tablet daily.  [provider]  Active Child  spironolactone  (ALDACTONE ) 25 MG tablet [id number]  Take 1/2 tablet (12.5 mg total) by mouth daily.  Patient not taking: Reported on 11/05/2024   Georgina Speaks, FNP  Active   tamsulosin  (FLOMAX ) 0.4 MG CAPS capsule [id number] Yes TAKE ONE CAPSULE BY MOUTH DAILY HALF HOUR FOLLOWING THE SAME MEAL DAILY Georgina Speaks, FNP  Active Child  vitamin B-12 (CYANOCOBALAMIN ) 500 MCG tablet [id number] Yes Take 500 mcg by mouth daily. [provider]  Active Child  vitamin C  (ASCORBIC ACID ) 500 MG tablet [id number] Yes Take 500 mg daily by mouth. [provider]  Active Child  Med List Note Steffi Nian, CPhT 09/07/24 9262): Contact  daughter as she manages the patients medications.             Home Care and Equipment/Supplies: Were Home Health Services Ordered?: Yes Name of Home Health Agency:: Resume with [facility] for PT/OT/SLP Has Agency set up a time to come to your home?: Yes First Home Health Visit Date: 11/05/24 Any new equipment or medical supplies ordered?: No  Functional Questionnaire: Do you need assistance with bathing/showering or dressing?: Yes (PCS for bathing) Do you need assistance with meal preparation?: Yes Do you need assistance with eating?: No Do you have difficulty maintaining continence: Yes Do you need assistance with getting out of bed/getting out of a chair/moving?: Yes Do you have difficulty managing or taking your medications?: Yes (Daughter manages medications)  Follow up appointments reviewed: PCP Follow-up appointment confirmed?: Yes Date of PCP follow-up appointment?: 11/04/24 Follow-up Provider: [name] Specialist [facility] Follow-up appointment confirmed?: Yes Date of Specialist follow-up appointment?: 11/09/24 Follow-Up Specialty Provider:: Cardiology Do you need transportation to your follow-up appointment?: No Do you understand care options if your condition(s) worsen?: Yes-patient verbalized understanding  SDOH Interventions Today    Flowsheet Row Most Recent Value  SDOH Interventions   Food Insecurity Interventions Intervention Not Indicated  Housing Interventions Intervention Not Indicated  Transportation Interventions Intervention Not Indicated  Utilities Interventions Intervention Not Indicated    Goals Addressed             This Visit's Progress    VBCI Transitions of Care (TOC) Care Plan       Problems:  Recent Hospitalization for treatment of HTN  Goal:  Over the next 30 days, the patient will not experience hospital readmission  Interventions:  Transitions of Care: Durable Medical Equipment (DME) reviewed with  patient/caregiver Doctor Visits  - discussed the importance of doctor visits SDOH assessment Medication review-advised contacting pharmacy for update on delivery of Spironolactone  Reviewed BP Reviewed weight Educated on daily weight and the importance of contacting the provider if weight gain of 3# overnight or 5# in a week Reviewed diet  Patient Self Care Activities:  Attend all scheduled provider appointments Call provider office for new concerns or questions  Notify RN Care Manager of TOC call rescheduling needs Take medications as prescribed   check blood pressure daily keep a blood pressure log call doctor for signs and symptoms of high blood pressure keep all doctor appointments eat more whole grains, fruits and vegetables, lean meats and healthy fats  Plan:  Telephone follow up appointment with care management team member scheduled for:  11/12/24 at 2pm       Discussed and offered 30 day TOC program.  Patient/DPR      enrolled.  The patient has been provided with contact information for the care management team and has been advised to call with any health -related questions or concerns.  The patient verbalized understanding with current plan of care.  The patient is directed to their insurance card regarding availability of benefits coverage.    Andrea Dimes RN, BSN [place]  [facility] RN Care Manager [phone]

## 2024-11-05 NOTE — Anesthesia Preprocedure Evaluation (Signed)
 Anesthesia Evaluation  Patient identified by MRN, date of birth, ID band Patient awake    Reviewed: Allergy & Precautions, NPO status , Patient's Chart, lab work & pertinent test results  History of Anesthesia Complications Negative for: history of anesthetic complications  Airway Mallampati: II  TM Distance: >3 FB Neck ROM: Full    Dental  (+) Upper Dentures, Lower Dentures, Dental Advisory Given   Pulmonary former smoker   breath sounds clear to auscultation       Cardiovascular hypertension, Pt. on home beta blockers and Pt. on medications + Peripheral Vascular Disease, +CHF (HFrEF), + DOE and + DVT  + dysrhythmias Atrial Fibrillation  Rhythm:Irregular  10/14/2024 TTE  1. Left ventricular ejection fraction, by estimation, is 25 to 30%. Left  ventricular ejection fraction by 3D volume is 29 %. The left ventricle has  severely decreased function. The left ventricle demonstrates global  hypokinesis. There is mild concentric  left ventricular hypertrophy.  Left ventricular diastolic function could not be evaluated.   2. Right ventricular systolic function is moderately reduced. The right  ventricular size is normal.   3. Left atrial size was moderately dilated.   4. Right atrial size was moderately dilated.   5. The mitral valve is normal in structure. Mild mitral valve  regurgitation. No evidence of mitral stenosis.   6. The aortic valve is tricuspid. There is moderate calcification of the  aortic valve. Aortic valve regurgitation is mild. Moderate aortic valve  stenosis. Aortic valve area, by VTI measures 1.10 cm. Aortic valve mean  gradient measures 8.2 mmHg. Aortic  valve Vmax measures 1.94 m/s.   7. Aortic dilatation noted. There is mild dilatation of the aortic root,  measuring 41 mm. There is moderate dilatation of the ascending aorta,  measuring 44 mm.   8. The inferior vena cava is normal in size with greater than  50%  respiratory variability, suggesting right atrial pressure of 3 mmHg.     Neuro/Psych CVA, Residual Symptoms    GI/Hepatic   Endo/Other   Hyperthyroidism   Renal/GU      Musculoskeletal  (+) Arthritis ,    Abdominal   Peds  Hematology  (+) Blood dyscrasia, anemia   Anesthesia Other Findings   Reproductive/Obstetrics                              Anesthesia Physical Anesthesia Plan  ASA: 4  Anesthesia Plan: General   Post-op Pain Management: Minimal or no pain anticipated   Induction: Intravenous  PONV Risk Score and Plan: 2 and Treatment may vary due to age or medical condition  Airway Management Planned: Natural Airway and Nasal Cannula  Additional Equipment: None  Intra-op Plan:   Post-operative Plan:   Informed Consent: I have reviewed the patients History and Physical, chart, labs and discussed the procedure including the risks, benefits and alternatives for the proposed anesthesia with the patient or authorized representative who has indicated his/her understanding and acceptance.     Dental advisory given  Plan Discussed with:   Anesthesia Plan Comments:          Anesthesia Quick Evaluation

## 2024-11-05 NOTE — Anesthesia Postprocedure Evaluation (Signed)
 Anesthesia Post Note  Patient: Social Worker  Procedure(s) Performed: CARDIOVERSION     Patient location during evaluation: PACU Anesthesia Type: General Level of consciousness: patient cooperative Pain management: pain level controlled Vital Signs Assessment: post-procedure vital signs reviewed and stable Respiratory status: spontaneous breathing, nonlabored ventilation and respiratory function stable Cardiovascular status: blood pressure returned to baseline and stable Postop Assessment: no apparent nausea or vomiting Anesthetic complications: no   No notable events documented.                Hajra Port

## 2024-11-06 ENCOUNTER — Other Ambulatory Visit (HOSPITAL_COMMUNITY): Payer: Self-pay

## 2024-11-09 ENCOUNTER — Ambulatory Visit: Admitting: Nurse Practitioner

## 2024-11-09 NOTE — Progress Notes (Deleted)
 Office Visit    Patient Name: Jesus Lam Date of Encounter: 11/09/2024  Primary Care Provider:  Georgina Speaks, FNP Primary Cardiologist:  Newman JINNY Lawrence, MD  Chief Complaint    89 year old male with a history of persistent atrial fibrillation/atrial flutter, HFrEF, mitral valve regurgitation, aortic valve regurgitation, aortic stenosis, aortic dilation, PAD, DVT, CVA, hypertension, hyperlipidemia, CKD, hyperthyroidism, BPH and gout who presents for hospital follow-up related to atrial flutter s/p DCCV.  Past Medical History    Past Medical History:  Diagnosis Date   Anemia    BPH (benign prostatic hyperplasia)    Cataracts, bilateral    CVA (cerebral vascular accident) (HCC)    CVA (cerebrovascular accident due to intracerebral hemorrhage) (HCC) 03/22/2024   Deep venous thrombosis (DVT) of left peroneal vein (HCC) 09/24/2023   DVT (deep venous thrombosis) (HCC)    dvt in left leg   Dyslipidemia    Dysuria 06/17/2023   Gout    Gout    HTN (hypertension)    Ischemic cerebrovascular accident (CVA) (HCC) 06/13/2018   Left hip pain 03/15/2020   Left leg pain    Lower abdominal pain 11/03/2023   Osteoarthritis    PAD (peripheral artery disease)    Stasis dermatitis    Stroke (HCC)    Vitamin D  deficiency    Past Surgical History:  Procedure Laterality Date   ABDOMINAL AORTOGRAM N/A 01/13/2018   Procedure: ABDOMINAL AORTOGRAM;  Surgeon: Lawrence Newman JINNY, MD;  Location: [facility];  Service: Cardiovascular;  Laterality: N/A;   CARDIOVERSION N/A 11/01/2024   Procedure: CARDIOVERSION;  Surgeon: Shlomo Wilbert SAUNDERS, MD;  Location: [facility];  Service: Cardiovascular;  Laterality: N/A;   CARDIOVERSION N/A 11/02/2024   Procedure: CARDIOVERSION;  Surgeon: Jeffrie Oneil BROCKS, MD;  Location: [facility];  Service: Cardiovascular;  Laterality: N/A;   CATARACT EXTRACTION, BILATERAL     LEFT HEART CATH AND CORONARY ANGIOGRAPHY N/A 01/13/2018   Procedure:  LEFT HEART CATH AND CORONARY ANGIOGRAPHY;  Surgeon: Lawrence Newman JINNY, MD;  Location: [facility];  Service: Cardiovascular;  Laterality: N/A;   LOWER EXTREMITY ANGIOGRAPHY N/A 01/13/2018   Procedure: LOWER EXTREMITY ANGIOGRAPHY;  Surgeon: Lawrence Newman JINNY, MD;  Location: [facility];  Service: Cardiovascular;  Laterality: N/A;   LOWER EXTREMITY ANGIOGRAPHY Right 01/27/2018   Procedure: LOWER EXTREMITY ANGIOGRAPHY;  Surgeon: Ladona Heinz, MD;  Location: [facility];  Service: Cardiovascular;  Laterality: Right;   PERIPHERAL VASCULAR ATHERECTOMY Left 01/13/2018   Procedure: PERIPHERAL VASCULAR ATHERECTOMY;  Surgeon: Lawrence Newman JINNY, MD;  Location: [facility];  Service: Cardiovascular;  Laterality: Left;  SFA WITH PTA DRUG COATED BALLOON   PERIPHERAL VASCULAR INTERVENTION  01/27/2018   Procedure: PERIPHERAL VASCULAR INTERVENTION;  Surgeon: Ladona Heinz, MD;  Location: [facility];  Service: Cardiovascular;;    Allergies  Allergies[1]   Labs/Other Studies Reviewed    The following studies were reviewed today:  Cardiac Studies & Procedures   ______________________________________________________________________________________________ CARDIAC CATHETERIZATION  CARDIAC CATHETERIZATION 01/13/2018  Conclusion Mild nonobstructive coronary artery disease Left distal SFA subtotal occlusion treated successfully with directional atherectomy and 6.0 X 120 mm In.Pact Admiral drug coated balloon PTA. 0% residual stenosis in distal SFA with excellent flow. 2 vessel runoff below the knee with proximally occluded left antetior tibial artery. Right distal SFA CTO with reconstitution in right poppliteal artery. 2 vessel runoff with  proximally occluded right antetior tibial artery.  Recommendation: Continue ASA 81 mg, lipitor   80 mg. 600 mg clopidogrel  loaded. Continue 75 mg clopidogrel  from tomorrow. Will stage Right distal SFA occlusion in the coming 2  weeks.  Newman JINNY Lawrence, MD [facility] [place] Pager: [phone] Office: [phone] If no answer Cell [phone]  Findings Coronary Findings Diagnostic  Dominance: Co-dominant  Left Main Vessel is normal in caliber. Vessel is angiographically normal.  Left Anterior Descending Vessel is normal in caliber. Vessel is angiographically normal.  Left Circumflex Prox Cx lesion is 50% stenosed.  Right Coronary Artery Vessel is normal in caliber. Vessel is angiographically normal.  Intervention  No interventions have been documented.   STRESS TESTS  MYOCARDIAL PERFUSION IMAGING 08/23/2024  Interpretation Summary   Findings are consistent with no ischemia and no infarction. The study is high risk due to LVEF < 35%.   No ST deviation was noted.   LV perfusion is normal.   Left ventricular function is abnormal. Global function is moderately reduced. Nuclear stress EF: 33%. The left ventricular ejection fraction is moderately decreased (30-44%). End diastolic cavity size is moderately enlarged. End systolic cavity size is moderately enlarged.   Prior study not available for comparison.   ECHOCARDIOGRAM  ECHOCARDIOGRAM COMPLETE 10/14/2024  Narrative ECHOCARDIOGRAM REPORT    Patient Name:   Jesus Lam Date of Exam: 10/14/2024 Medical Rec #:  [id number]       Height:       72.0 in Accession #:    [id number]      Weight:       256.0 lb Date of Birth:  06/13/1933        BSA:          2.366 m Patient Age:    89 years        BP:           120/60 mmHg Patient Gender: M               HR:           80 bpm. Exam Location:  [address]  Procedure: 2D Echo and Intracardiac Opacification Agent (Both Spectral and Color Flow Doppler were utilized during procedure).  Indications:    I50.20* Unspecified systolic (congestive) heart failure  History:        Patient has prior history of Echocardiogram examinations, most recent 09/08/2024. PAD and Stroke,  Arrythmias:Atrial Flutter, Signs/Symptoms:Fatigue; Risk Factors:Hypertension and Dyslipidemia. Precordial pain. DVT. First degree AV block. Hyperthyroidism. Chronic kidney disease.  Sonographer:    Jon Hacker RCS Referring Phys: [id number] [facility] J PATWARDHAN  IMPRESSIONS   1. Left ventricular ejection fraction, by estimation, is 25 to 30%. Left ventricular ejection fraction by 3D volume is 29 %. The left ventricle has severely decreased function. The left ventricle demonstrates global hypokinesis. There is mild concentric left ventricular hypertrophy. Left ventricular diastolic function could not be evaluated. 2. Right ventricular systolic function is moderately reduced. The right ventricular size is normal. 3. Left atrial size was moderately dilated. 4. Right atrial size was moderately dilated. 5. The mitral valve is normal in structure. Mild mitral valve regurgitation. No evidence of mitral stenosis. 6. The aortic valve is tricuspid. There is moderate calcification of the aortic valve. Aortic valve regurgitation is mild. Moderate aortic valve stenosis. Aortic valve area, by VTI measures 1.10 cm. Aortic valve mean gradient measures 8.2 mmHg. Aortic valve Vmax measures 1.94 m/s. 7. Aortic dilatation noted. There is mild dilatation of the aortic root, measuring 41 mm. There is moderate dilatation of the ascending aorta, measuring 44 mm. 8.  The inferior vena cava is normal in size with greater than 50% respiratory variability, suggesting right atrial pressure of 3 mmHg.  FINDINGS Left Ventricle: Left ventricular ejection fraction, by estimation, is 25 to 30%. Left ventricular ejection fraction by 3D volume is 29 %. The left ventricle has severely decreased function. The left ventricle demonstrates global hypokinesis. The left ventricular internal cavity size was normal in size. There is mild concentric left ventricular hypertrophy. Left ventricular diastolic function could not be evaluated  due to atrial fibrillation. Left ventricular diastolic function could not be evaluated.  Right Ventricle: The right ventricular size is normal. No increase in right ventricular wall thickness. Right ventricular systolic function is moderately reduced.  Left Atrium: Left atrial size was moderately dilated.  Right Atrium: Right atrial size was moderately dilated.  Pericardium: There is no evidence of pericardial effusion.  Mitral Valve: The mitral valve is normal in structure. Mild mitral valve regurgitation. No evidence of mitral valve stenosis.  Tricuspid Valve: The tricuspid valve is normal in structure. Tricuspid valve regurgitation is mild . No evidence of tricuspid stenosis.  Aortic Valve: The aortic valve is tricuspid. There is moderate calcification of the aortic valve. Aortic valve regurgitation is mild. Moderate aortic stenosis is present. Aortic valve mean gradient measures 8.2 mmHg. Aortic valve peak gradient measures 15.1 mmHg. Aortic valve area, by VTI measures 1.10 cm.  Pulmonic Valve: The pulmonic valve was normal in structure. Pulmonic valve regurgitation is not visualized. No evidence of pulmonic stenosis.  Aorta: Aortic dilatation noted. There is mild dilatation of the aortic root, measuring 41 mm. There is moderate dilatation of the ascending aorta, measuring 44 mm.  Venous: The inferior vena cava is normal in size with greater than 50% respiratory variability, suggesting right atrial pressure of 3 mmHg.  IAS/Shunts: No atrial level shunt detected by color flow Doppler.  Additional Comments: 3D was performed not requiring image post processing on an independent workstation and was abnormal.   LEFT VENTRICLE PLAX 2D LVIDd:         5.38 cm LVIDs:         4.73 cm LV PW:         1.26 cm         3D Volume EF LV IVS:        1.23 cm         LV 3D EF:    Left LVOT diam:     2.00 cm                      ventricul LV SV:         39                           ar LV SV  Index:   16                           ejection LVOT Area:     3.14 cm                     fraction by 3D volume is 29 %.  3D Volume EF: 3D EF:        29 % LV EDV:       282 ml LV ESV:       202 ml LV SV:        81 ml  RIGHT VENTRICLE RV Basal  diam:  4.22 cm RV S prime:     9.78 cm/s TAPSE (M-mode): 1.0 cm  LEFT ATRIUM             Index        RIGHT ATRIUM           Index LA diam:        4.60 cm 1.94 cm/m   RA Area:     28.40 cm LA Vol (A2C):   71.1 ml 30.05 ml/m  RA Volume:   97.90 ml  41.37 ml/m LA Vol (A4C):   78.6 ml 33.22 ml/m LA Biplane Vol: 81.0 ml 34.23 ml/m AORTIC VALVE AV Area (Vmax):    1.10 cm AV Area (Vmean):   1.07 cm AV Area (VTI):     1.10 cm AV Vmax:           194.00 cm/s AV Vmean:          131.250 cm/s AV VTI:            0.352 m AV Peak Grad:      15.1 mmHg AV Mean Grad:      8.2 mmHg LVOT Vmax:         68.17 cm/s LVOT Vmean:        44.567 cm/s LVOT VTI:          0.123 m LVOT/AV VTI ratio: 0.35  AORTA Ao Root diam: 4.10 cm Ao Asc diam:  4.40 cm   SHUNTS Systemic VTI:  0.12 m Systemic Diam: 2.00 cm  Toribio Fuel MD Electronically signed by Toribio Fuel MD Signature Date/Time: 10/14/2024/11:03:24 PM    Final    MONITORS  LONG TERM MONITOR-LIVE TELEMETRY (3-14 DAYS) 06/23/2024  Narrative Zio patch monitor 14 days 04/29/2024 - 05/13/2024: Dominant rhythm: Atrial flutter HR 35-134 bpm. Avg HR 69 bpm. Atrial Flutter occurred continuously (100% burden), ranging from 35-134 bpm (avg of 69 bpm). Bundle Branch Block/IVCD was present. Isolated VEs were rare. 0 episodes of SVT. 0 episodes of VT. <1% isolated VE, couplet/triplets. No SVT/VT/high grade AV block, sinus pause >3sec noted. 0 patient triggered events.       ______________________________________________________________________________________________     Recent Labs: 09/06/2024: Pro Brain Natriuretic Peptide 846.0 09/07/2024: TSH 7.531 09/12/2024: Magnesium   2.3 11/01/2024: ALT 15; B Natriuretic Peptide 106.3 11/03/2024: BUN 28; Creatinine, Ser 1.49; Hemoglobin 11.5; Platelets 168; Potassium 3.8; Sodium 140  Recent Lipid Panel    Component Value Date/Time   CHOL 94 03/23/2024 0836   CHOL 99 (L) 11/03/2023 1549   TRIG 112 03/23/2024 0836   HDL 23 (L) 03/23/2024 0836   HDL 29 (L) 11/03/2023 1549   CHOLHDL 4.1 03/23/2024 0836   VLDL 22 03/23/2024 0836   LDLCALC 49 03/23/2024 0836   LDLCALC 40 11/03/2023 1549    History of Present Illness    89 year old male with the above past medical history including persistent atrial fibrillation/atrial flutter, HFrEF, mitral valve regurgitation, aortic valve regurgitation, aortic stenosis, aortic dilation, PAD, DVT, CVA, hypertension, hyperlipidemia, CKD, hyperthyroidism, BPH and gout.  He has a history of recurrent CVA.  Cardiac monitor in 05/2024 revealed continuous atrial flutter, 100% burden.  Lexiscan  Myoview  in 07/2024 showed EF less than 35%, no evidence of ischemia or infarction.  Echocardiogram in 08/2024 showed EF 30 to 35%.  Repeat echocardiogram in 09/2024 showed EF 25 to 30%, severely decreased LV systolic function, mild concentric LVH, normal RV systolic function, mild mitral valve regurgitation, mild aortic valve regurgitation, moderate aortic valve stenosis, mean  gradient 8.2 mmHg ,mild dilation of the aortic root measuring 41 mm, moderate dilation of the ascending aorta measuring 44 mm.  He was evaluated by EP.  It was noted that his persistent atrial flutter was likely exacerbating his chronic HFrEF.  Possibility of ablation was discussed, however this was ultimately deferred.  He was last seen in the office on 10/28/2024 and reported dyspnea on exertion, bilateral lower extreme edema.  Lasix  was increased to 40 mg daily.  He was scheduled for atrial fibrillation/flutter ablation on 11/01/2024.  While in the preop setting, he was noted to be markedly hypertensive, transient aphasia.  Code stroke was  called CTA of the head was negative for acute findings.  He was admitted for observation.  He was noted to be fluid volume overloaded.  He was diuresed with IV Lasix .  He underwent successful DCDV on 11/02/2024.  He was discharged home in stable condition on 11/02/2024.  He presents today for follow-up.  Since his hospitalization  Persistent atrial fibrillation/atrial flutter: HFrEF: Valvular heart disease: Aortic dilation: History of DVT: History of CVA: Hypertension: Hyperlipidemia: CKD: Hyperthyroidism: Disposition:  Repeat echocardiogram was recommended in 12/2024.  It was noted that his EF remained reduced, consider transitioning from losartan  to Entresto .  He did not tolerate SGLT2 due to hypotension. Home Medications    Current Outpatient Medications  Medication Sig Dispense Refill   apixaban  (ELIQUIS ) 5 MG TABS tablet Take 1 tablet (5 mg total) by mouth 2 (two) times daily. 180 tablet 3   atorvastatin  (LIPITOR ) 80 MG tablet Take 1 tablet (80 mg total) by mouth daily. 90 tablet 3   azelastine  (ASTELIN ) 0.1 % nasal spray Place 2 sprays into both nostrils 2 (two) times daily. Use in each nostril as directed (Patient taking differently: Place 2 sprays into both nostrils 2 (two) times daily as needed. Use in each nostril as directed) 30 mL 5   Cholecalciferol  (VITAMIN D3) 5000 units CAPS Take 5,000 Units by mouth daily.     diclofenac  Sodium (VOLTAREN ) 1 % GEL Apply 2 g topically 4 (four) times daily. APPLY TWO GRAMS TOPICALLY FOUR TIMES A DAY (Patient taking differently: Apply 2 g topically 2 (two) times daily. APPLY TWO GRAMS TOPICALLY FOUR TIMES A DAY) 100 g 1   furosemide  (LASIX ) 40 MG tablet Take 1 tablet (40 mg total) by mouth daily. 90 tablet 3   GNP 8 HOUR ARTHRITIS RELIEF 650 MG CR tablet TAKE ONE TABLET BY MOUTH TWICE A DAY 60 tablet 0   losartan  (COZAAR ) 25 MG tablet Take 1 tablet (25 mg total) by mouth daily. 90 tablet 3   methimazole  (TAPAZOLE ) 10 MG tablet Take 1 tablet (10  mg total) by mouth daily. 30 tablet 6   mirtazapine  (REMERON ) 7.5 MG tablet TAKE 1 TABLET (7.5 MG TOTAL) BY MOUTH AT BEDTIME. 30 tablet 0   Multiple Vitamins-Minerals (COMPLETE SENIOR PO) 1 tablet daily.      spironolactone  (ALDACTONE ) 25 MG tablet Take 1/2 tablet (12.5 mg total) by mouth daily. (Patient not taking: Reported on 11/05/2024) 45 tablet 2   tamsulosin  (FLOMAX ) 0.4 MG CAPS capsule TAKE ONE CAPSULE BY MOUTH DAILY HALF HOUR FOLLOWING THE SAME MEAL DAILY 90 capsule 4   vitamin B-12 (CYANOCOBALAMIN ) 500 MCG tablet Take 500 mcg by mouth daily.     vitamin C  (ASCORBIC ACID ) 500 MG tablet Take 500 mg daily by mouth.     No current facility-administered medications for this visit.     Review of Systems    ***.  All other systems reviewed and are otherwise negative except as noted above.    Physical Exam    VS:  There were no vitals taken for this visit. , BMI There is no height or weight on file to calculate BMI.     GEN: Well nourished, well developed, in no acute distress. HEENT: normal. Neck: Supple, no JVD, carotid bruits, or masses. Cardiac: RRR, no murmurs, rubs, or gallops. No clubbing, cyanosis, edema.  Radials/DP/PT 2+ and equal bilaterally.  Respiratory:  Respirations regular and unlabored, clear to auscultation bilaterally. GI: Soft, nontender, nondistended, BS + x 4. MS: no deformity or atrophy. Skin: warm and dry, no rash. Neuro:  Strength and sensation are intact. Psych: Normal affect.  Accessory Clinical Findings    ECG personally reviewed by me today -    - no acute changes.   Lab Results  Component Value Date   WBC 6.0 11/03/2024   HGB 11.5 (L) 11/03/2024   HCT 37.2 (L) 11/03/2024   MCV 94.9 11/03/2024   PLT 168 11/03/2024   Lab Results  Component Value Date   CREATININE 1.49 (H) 11/03/2024   BUN 28 (H) 11/03/2024   NA 140 11/03/2024   K 3.8 11/03/2024   CL 105 11/03/2024   CO2 27 11/03/2024   Lab Results  Component Value Date   ALT 15  11/01/2024   AST 18 11/01/2024   ALKPHOS 70 11/01/2024   BILITOT 1.2 11/01/2024   Lab Results  Component Value Date   CHOL 94 03/23/2024   HDL 23 (L) 03/23/2024   LDLCALC 49 03/23/2024   TRIG 112 03/23/2024   CHOLHDL 4.1 03/23/2024    Lab Results  Component Value Date   HGBA1C 4.8 03/23/2024    Assessment & Plan    1.  ***  No BP recorded.  {Refresh Note OR Click here to enter BP  :1}***   Damien JAYSON Braver, NP 11/09/2024, 6:28 AM       [1]  Allergies Allergen Reactions   Porcine (Pork) Protein-Containing Drug Products Other (See Comments)    Religious    Shellfish Allergy Other (See Comments)    Gout

## 2024-11-12 ENCOUNTER — Other Ambulatory Visit: Payer: Self-pay | Admitting: *Deleted

## 2024-11-12 NOTE — Transitions of Care (Post Inpatient/ED Visit) (Signed)
 " Transition of Care week 2  Visit Note  11/12/2024  Name: Jesus Lam MRN: [id number]          DOB: [birth year]  Situation: Patient enrolled in [facility] 30-day program. Visit completed with DPR/[name] by telephone.   Background:   Initial Transition Care Management Follow-up Telephone Call Discharge Date and Diagnosis: 11/03/24, Hypertensive emergency   Past Medical History:  Diagnosis Date   Anemia    BPH (benign prostatic hyperplasia)    Cataracts, bilateral    CVA (cerebral vascular accident) (HCC)    CVA (cerebrovascular accident due to intracerebral hemorrhage) (HCC) 03/22/2024   Deep venous thrombosis (DVT) of left peroneal vein (HCC) 09/24/2023   DVT (deep venous thrombosis) (HCC)    dvt in left leg   Dyslipidemia    Dysuria 06/17/2023   Gout    Gout    HTN (hypertension)    Ischemic cerebrovascular accident (CVA) (HCC) 06/13/2018   Left hip pain 03/15/2020   Left leg pain    Lower abdominal pain 11/03/2023   Osteoarthritis    PAD (peripheral artery disease)    Stasis dermatitis    Stroke (HCC)    Vitamin D  deficiency     Assessment: Patient Reported Symptoms: Cognitive Cognitive Status: Able to follow simple commands, Alert and oriented to person, place, and time, Normal speech and language skills      Neurological Neurological Review of Symptoms: No symptoms reported    HEENT HEENT Symptoms Reported: Not assessed      Cardiovascular Cardiovascular Symptoms Reported: No symptoms reported    Respiratory Respiratory Symptoms Reported: No symptoms reported    Endocrine Endocrine Symptoms Reported: No symptoms reported Is patient diabetic?: No    Gastrointestinal Gastrointestinal Symptoms Reported: No symptoms reported      Genitourinary Genitourinary Symptoms Reported: No symptoms reported Genitourinary Self-Management Outcome: 4 (good)  Integumentary Integumentary Symptoms Reported: No symptoms reported    Musculoskeletal Musculoskelatal Symptoms  Reviewed: Limited mobility Additional Musculoskeletal Details: ambulates with walker, working with PT/OT weekly Musculoskeletal Self-Management Outcome: 4 (good)      Psychosocial Psychosocial Symptoms Reported: No symptoms reported         There were no vitals filed for this visit.    Medications Reviewed Today     Reviewed by Lucky Andrea LABOR, RN (Registered Nurse) on 11/12/24 at 1146  Med List Status: <None>   Medication Order Taking? Sig Documenting Provider Last Dose Status Informant  apixaban  (ELIQUIS ) 5 MG TABS tablet [id number] Yes Take 1 tablet (5 mg total) by mouth 2 (two) times daily. Patwardhan, Newman PARAS, MD  Active   atorvastatin  (LIPITOR ) 80 MG tablet [id number] Yes Take 1 tablet (80 mg total) by mouth daily. Patwardhan, Newman PARAS, MD  Active   azelastine  (ASTELIN ) 0.1 % nasal spray [id number] Yes Place 2 sprays into both nostrils 2 (two) times daily. Use in each nostril as directed  Patient taking differently: Place 2 sprays into both nostrils 2 (two) times daily as needed. Use in each nostril as directed   Georgina Speaks, FNP  Active Child  Cholecalciferol  (VITAMIN D3) 5000 units CAPS [id number] Yes Take 5,000 Units by mouth daily. [provider]  Active Child  diclofenac  Sodium (VOLTAREN ) 1 % GEL [id number] Yes Apply 2 g topically 4 (four) times daily. APPLY TWO GRAMS TOPICALLY FOUR TIMES A DAY  Patient taking differently: Apply 2 g topically 2 (two) times daily. APPLY TWO GRAMS TOPICALLY FOUR TIMES A DAY   Georgina Speaks, FNP  Active Child  furosemide  (  LASIX ) 40 MG tablet [id number] Yes Take 1 tablet (40 mg total) by mouth daily. [place] Newman PARAS, MD  Active   GNP 8 HOUR ARTHRITIS RELIEF 650 MG CR tablet [id number] Yes TAKE ONE TABLET BY MOUTH TWICE A DAY Georgina Speaks, FNP  Active Child  losartan  (COZAAR ) 25 MG tablet [id number] Yes Take 1 tablet (25 mg total) by mouth daily. Patwardhan, Newman PARAS, MD  Active   methimazole  (TAPAZOLE ) 10 MG tablet [id number] Yes Take 1  tablet (10 mg total) by mouth daily. Shamleffer, Donell Cardinal, MD  Active Child  mirtazapine  (REMERON ) 7.5 MG tablet [id number] Yes TAKE 1 TABLET (7.5 MG TOTAL) BY MOUTH AT BEDTIME. Georgina Speaks, FNP  Active Child  Multiple Vitamins-Minerals (COMPLETE SENIOR PO) [id number] Yes 1 tablet daily.  [provider]  Active Child  spironolactone  (ALDACTONE ) 25 MG tablet [id number] Yes Take 1/2 tablet (12.5 mg total) by mouth daily. Georgina Speaks, FNP  Active   tamsulosin  (FLOMAX ) 0.4 MG CAPS capsule [id number] Yes TAKE ONE CAPSULE BY MOUTH DAILY HALF HOUR FOLLOWING THE SAME MEAL DAILY Georgina Speaks, FNP  Active Child  vitamin B-12 (CYANOCOBALAMIN ) 500 MCG tablet [id number] Yes Take 500 mcg by mouth daily. [provider]  Active Child  vitamin C  (ASCORBIC ACID ) 500 MG tablet [id number] Yes Take 500 mg daily by mouth. [provider]  Active Child  Med List Note Steffi Nian, CPhT 09/07/24 [phone]): Contact daughter as she manages the patients medications.             Recommendation:   Continue Current Plan of Care  Follow Up Plan:   Telephone follow-up in 1 week  Andrea Dimes RN, BSN   [facility] RN Care Manager [phone]     "

## 2024-11-12 NOTE — Patient Instructions (Signed)
 Visit Information  Thank you for taking time to visit with me today. Please don't hesitate to contact me if I can be of assistance to you before our next scheduled telephone appointment.   Following is a copy of your care plan:   Goals Addressed             This Visit's Progress    VBCI Transitions of Care (TOC) Care Plan       Problems:  Recent Hospitalization for treatment of HTN  Goal:  Over the next 30 days, the patient will not experience hospital readmission  Interventions:  Transitions of Care: Doctor Visits  - discussed the importance of doctor visits Medication review-advised taking all medications to provider visits Educated on daily weight and the importance of contacting the provider if weight gain of 3# overnight or 5# in a week Reviewed Cardiology appointment rescheduled to 11/19/24, Patient's Dtr/[name] will attend with him  Patient Self Care Activities:  Attend all scheduled provider appointments Call provider office for new concerns or questions  Notify RN Care Manager of [facility] call rescheduling needs Take medications as prescribed   check blood pressure daily keep a blood pressure log call doctor for signs and symptoms of high blood pressure keep all doctor appointments eat more whole grains, fruits and vegetables, lean meats and healthy fats  Plan:  Telephone follow up appointment with care management team member scheduled for:  11/24/24 at 12:45pm        Patient verbalizes understanding of instructions and care plan provided today and agrees to view in MyChart. Active MyChart status and patient understanding of how to access instructions and care plan via MyChart confirmed with patient.     Telephone follow up appointment with care management team member scheduled for:11/24/24 at 12:45pm  Please call the care guide team at [phone] if you need to cancel or reschedule your appointment.   Please call 1-800-273-TALK (toll free, 24 hour  hotline) go to [facility] Urgent [facility] [address] [place] [phone]) call 911 if you are experiencing a Mental Health or Behavioral Health Crisis or need someone to talk to.  Andrea Dimes RN, BSN [place]  [facility] RN Care Manager [phone]

## 2024-11-14 DIAGNOSIS — E876 Hypokalemia: Secondary | ICD-10-CM | POA: Insufficient documentation

## 2024-11-14 NOTE — Assessment & Plan Note (Signed)
 TCM Performed. A member of the clinical team spoke with the patient upon dischare. Discharge summary was reviewed in full detail during the visit. Meds reconciled and compared to discharge meds. Medication list is updated and reviewed with the patient.  Greater than 50% face to face time was spent in counseling an coordination of care.  All questions were answered to the satisfaction of the patient.   He is doing better, continue current medications. Continue with PT/OT

## 2024-11-14 NOTE — Assessment & Plan Note (Signed)
 Potassium was decreased during hospital visit, will recheck levels and he is on spironolactone 

## 2024-11-19 ENCOUNTER — Ambulatory Visit: Attending: Cardiology | Admitting: Cardiology

## 2024-11-19 NOTE — Progress Notes (Deleted)
" °  Cardiology Office Note:  .   Date:  11/19/2024  ID:  [name], DOB Oct 12, 1933, MRN [id number] PCP: Georgina Speaks, FNP  [facility] Providers Cardiologist:  Newman Lawrence, MD PCP: Georgina Speaks, FNP  No chief complaint on file.    [facility] Lam is a 89 y.o. male with hypertension, hyperlipidemia, persistent atrial flutter, HFrEF  Discussed the use of AI scribe software for clinical note transcription with the patient, who gave verbal consent to proceed.  History of Present Illness  Patient was recently hospitalized and underwent IV diuresis as well as cardioversion for atrial flutter. ***     There were no vitals filed for this visit.    ROS      Studies Reviewed: .        *** Labs 10/2024: Chol ***, TG ***, HDL ***, LDL *** HbA1C ***% Hb 11.5 Cr 1.49, eGFR 45 BNP 108  Risk Assessment/Calculations:    CHA2DS2-VASc Score = 7  This indicates a 11.2% annual risk of stroke. The patient's score is based upon: CHF History: 1 HTN History: 1 Diabetes History: 0 Stroke History: 2 Vascular Disease History: 1 Age Score: 2 Gender Score: 0     Physical Exam   VISIT DIAGNOSES: No diagnosis found.   [facility] Lam is a 89 y.o. male with hypertension, hyperlipidemia, persistent atrial flutter, HFrEF Assessment and Plan Assessment & Plan       {Are you ordering a CV Procedure (e.g. stress test, cath, DCCV, TEE, etc)?   Press F2        :[id number]}    No orders of the defined types were placed in this encounter.    F/u in ***  Signed, Newman JINNY Lawrence, MD  "

## 2024-11-24 ENCOUNTER — Other Ambulatory Visit: Payer: Self-pay | Admitting: *Deleted

## 2024-11-24 NOTE — Transitions of Care (Post Inpatient/ED Visit) (Signed)
 " Transition of Care week 3  Visit Note  11/24/2024  Name: Jesus Lam MRN: [id number]          DOB: Sep 27, 1933  Situation: Patient enrolled in [facility] 30-day program. Visit completed with DPR/[name] by telephone.   Background:   Initial Transition Care Management Follow-up Telephone Call Discharge Date and Diagnosis: 11/03/24, Hypertensive emergency   Past Medical History:  Diagnosis Date   Anemia    BPH (benign prostatic hyperplasia)    Cataracts, bilateral    CVA (cerebral vascular accident) (HCC)    CVA (cerebrovascular accident due to intracerebral hemorrhage) (HCC) 03/22/2024   Deep venous thrombosis (DVT) of left peroneal vein (HCC) 09/24/2023   DVT (deep venous thrombosis) (HCC)    dvt in left leg   Dyslipidemia    Dysuria 06/17/2023   Gout    Gout    HTN (hypertension)    Ischemic cerebrovascular accident (CVA) (HCC) 06/13/2018   Left hip pain 03/15/2020   Left leg pain    Lower abdominal pain 11/03/2023   Osteoarthritis    PAD (peripheral artery disease)    Stasis dermatitis    Stroke (HCC)    Vitamin D  deficiency     Assessment: Patient Reported Symptoms: Cognitive Cognitive Status: No symptoms reported (Per DPR/[name])      Neurological Neurological Review of Symptoms: No symptoms reported (Per DPR/[name])    HEENT HEENT Symptoms Reported: Not assessed      Cardiovascular Cardiovascular Symptoms Reported: No symptoms reported (Per DPR/[name]) Cardiovascular Management Strategies: Medication therapy, Routine screening, Adequate rest, Diet modification Weight: 250 lb (113.4 kg) Cardiovascular Self-Management Outcome: 4 (good) Cardiovascular Comment: BP today 142/90, DPR reports all other readings have been in normal range. Advised follow up with provider if BP continue to be elevated or 3# weight gain over night or 5# in 5 days. Advised rescheduling with Dr. Ritchie  Respiratory Respiratory Symptoms Reported: No symptoms reported (Per DPR/[name])     Endocrine Endocrine Symptoms Reported: No symptoms reported (Per DPR/[name])    Gastrointestinal Gastrointestinal Symptoms Reported: No symptoms reported (Per DPR/[name])      Genitourinary Genitourinary Symptoms Reported: No symptoms reported (Per DPR/[name])    Integumentary Integumentary Symptoms Reported: No symptoms reported (Per DPR/[name])    Musculoskeletal Musculoskelatal Symptoms Reviewed: Limited mobility Additional Musculoskeletal Details: ambulates with walker, continues to work with PT/OT weekly Musculoskeletal Self-Management Outcome: 4 (good)      Psychosocial Psychosocial Symptoms Reported: Not assessed         Today's Vitals   11/24/24 1308  Weight: 250 lb (113.4 kg)      Medications Reviewed Today     Reviewed by Lucky Andrea LABOR, RN (Registered Nurse) on 11/24/24 at 1254  Med List Status: <None>   Medication Order Taking? Sig Documenting Provider Last Dose Status Informant  apixaban  (ELIQUIS ) 5 MG TABS tablet [id number] Yes Take 1 tablet (5 mg total) by mouth 2 (two) times daily. Patwardhan, Newman PARAS, MD  Active   atorvastatin  (LIPITOR ) 80 MG tablet [id number] Yes Take 1 tablet (80 mg total) by mouth daily. Patwardhan, Newman PARAS, MD  Active   azelastine  (ASTELIN ) 0.1 % nasal spray [id number] Yes Place 2 sprays into both nostrils 2 (two) times daily. Use in each nostril as directed  Patient taking differently: Place 2 sprays into both nostrils 2 (two) times daily as needed. Use in each nostril as directed   Georgina Speaks, FNP  Active Child  Cholecalciferol  (VITAMIN D3) 5000 units CAPS [id number] Yes Take 5,000 Units by mouth  daily. [provider]  Active Child  diclofenac  Sodium (VOLTAREN ) 1 % GEL [id number] Yes Apply 2 g topically 4 (four) times daily. APPLY TWO GRAMS TOPICALLY FOUR TIMES A DAY  Patient taking differently: Apply 2 g topically 2 (two) times daily. APPLY TWO GRAMS TOPICALLY FOUR TIMES A DAY   Georgina Speaks, FNP  Active Child  furosemide   (LASIX ) 40 MG tablet [id number] Yes Take 1 tablet (40 mg total) by mouth daily. [place] Newman PARAS, MD  Active   GNP 8 HOUR ARTHRITIS RELIEF 650 MG CR tablet [id number] Yes TAKE ONE TABLET BY MOUTH TWICE A DAY Georgina Speaks, FNP  Active Child  losartan  (COZAAR ) 25 MG tablet [id number] Yes Take 1 tablet (25 mg total) by mouth daily. Patwardhan, Newman PARAS, MD  Active   methimazole  (TAPAZOLE ) 10 MG tablet [id number] Yes Take 1 tablet (10 mg total) by mouth daily. Shamleffer, Donell Cardinal, MD  Active Child  mirtazapine  (REMERON ) 7.5 MG tablet [id number] Yes TAKE 1 TABLET (7.5 MG TOTAL) BY MOUTH AT BEDTIME. Georgina Speaks, FNP  Active Child  Multiple Vitamins-Minerals (COMPLETE SENIOR PO) [id number] Yes 1 tablet daily.  [provider]  Active Child  spironolactone  (ALDACTONE ) 25 MG tablet [id number] Yes Take 1/2 tablet (12.5 mg total) by mouth daily. Georgina Speaks, FNP  Active   tamsulosin  (FLOMAX ) 0.4 MG CAPS capsule [id number] Yes TAKE ONE CAPSULE BY MOUTH DAILY HALF HOUR FOLLOWING THE SAME MEAL DAILY Georgina Speaks, FNP  Active Child  vitamin B-12 (CYANOCOBALAMIN ) 500 MCG tablet [id number] Yes Take 500 mcg by mouth daily. [provider]  Active Child  vitamin C  (ASCORBIC ACID ) 500 MG tablet [id number] Yes Take 500 mg daily by mouth. [provider]  Active Child  Med List Note Steffi Nian, CPhT 09/07/24 [phone]): Contact daughter as she manages the patients medications.             Recommendation:   Continue Current Plan of Care  Follow Up Plan:   Telephone follow-up in 1 week  Andrea Dimes RN, BSN [place]  Value[facility]Based Care Institute [facility] RN Care Manager [phone]     "

## 2024-11-24 NOTE — Patient Instructions (Signed)
 Visit Information  Thank you for taking time to visit with me today. Please don't hesitate to contact me if I can be of assistance to you before our next scheduled telephone appointment.   Following is a copy of your care plan:   Goals Addressed             This Visit's Progress    VBCI Transitions of Care (TOC) Care Plan       Problems:  Recent Hospitalization for treatment of HTN  Goal:  Over the next 30 days, the patient will not experience hospital readmission  Interventions:  Transitions of Care: Doctor Visits  - discussed the importance of doctor visits Medication review-advised taking all medications to provider visits Educated on daily weight and the importance of contacting the provider if weight gain of 3# overnight or 5# in a week Advised rescheduling missed visit with Dr. Krystal will call  Reviewed weight Reviewed BP  Patient Self Care Activities:  Attend all scheduled provider appointments Call provider office for new concerns or questions  Notify RN Care Manager of [facility] call rescheduling needs Take medications as prescribed   check blood pressure daily keep a blood pressure log call doctor for signs and symptoms of high blood pressure keep all doctor appointments eat more whole grains, fruits and vegetables, lean meats and healthy fats  Plan:  Telephone follow up appointment with care management team member scheduled for:  12/02/24 at 2pm        Patient verbalizes understanding of instructions and care plan provided today and agrees to view in MyChart. Active MyChart status and patient understanding of how to access instructions and care plan via MyChart confirmed with patient.     Telephone follow up appointment with care management team member scheduled for:12/02/24 at 2pm  Please call the care guide team at [phone] if you need to cancel or reschedule your appointment.   Please call 1-800-273-TALK (toll free, 24 hour hotline) go to  [facility] Urgent [facility] [address] [place] [phone]) call 911 if you are experiencing a Mental Health or Behavioral Health Crisis or need someone to talk to.  Andrea Dimes RN, BSN [place]  Value[facility]Based Care Institute [facility] RN Care Manager [phone]

## 2024-12-02 ENCOUNTER — Ambulatory Visit: Admitting: Neurology

## 2024-12-02 ENCOUNTER — Other Ambulatory Visit: Payer: Self-pay | Admitting: *Deleted

## 2024-12-02 NOTE — Patient Instructions (Signed)
 Visit Information  Thank you for taking time to visit with me today. Please don't hesitate to contact me if I can be of assistance to you before our next scheduled telephone appointment.   Following is a copy of your care plan:   Goals Addressed             This Visit's Progress    COMPLETED: VBCI Transitions of Care (TOC) Care Plan       Problems:  Recent Hospitalization for treatment of HTN  Goal:  Over the next 30 days, the patient will not experience hospital readmission  Interventions:  Transitions of Care: Doctor Visits  - discussed the importance of doctor visits Medication review-advised taking all medications to provider visits Educated on daily weight and the importance of contacting the provider if weight gain of 3# overnight or 5# in a week Advised rescheduling missed visit with Dr. Patwardan-Dtr/Rescheduled to 01/19/25 Offered referrat to Longitudinal Case Management-DPR declined   Patient Self Care Activities:  Attend all scheduled provider appointments Call provider office for new concerns or questions  Notify RN Care Manager of TOC call rescheduling needs Take medications as prescribed   check blood pressure daily keep a blood pressure log call doctor for signs and symptoms of high blood pressure keep all doctor appointments eat more whole grains, fruits and vegetables, lean meats and healthy fats  Plan:  No further follow up required:          Patient verbalizes understanding of instructions and care plan provided today and agrees to view in MyChart. Active MyChart status and patient understanding of how to access instructions and care plan via MyChart confirmed with patient.     No further follow up required:    Please call the care guide team at [phone] if you need to cancel or reschedule your appointment.   Please call 1-800-273-TALK (toll free, 24 hour hotline) go to [facility] Urgent [facility] [address]  [place] [phone]) call 911 if you are experiencing a Mental Health or Behavioral Health Crisis or need someone to talk to.  Andrea Dimes RN, BSN [place]  Value-Based Care Institute [facility] RN Care Manager [phone]

## 2024-12-02 NOTE — Transitions of Care (Post Inpatient/ED Visit) (Signed)
 " Transition of Care week 4  Visit Note  12/02/2024  Name: Jesus Lam MRN: [id number]          DOB: 08-27-1933  Situation: Patient enrolled in [facility] 30-day program. Visit completed with DPR/[name] by telephone.   Background:   Initial Transition Care Management Follow-up Telephone Call Discharge Date and Diagnosis: 11/03/24, Hypertensive emergency   Past Medical History:  Diagnosis Date   Anemia    BPH (benign prostatic hyperplasia)    Cataracts, bilateral    CVA (cerebral vascular accident) (HCC)    CVA (cerebrovascular accident due to intracerebral hemorrhage) (HCC) 03/22/2024   Deep venous thrombosis (DVT) of left peroneal vein (HCC) 09/24/2023   DVT (deep venous thrombosis) (HCC)    dvt in left leg   Dyslipidemia    Dysuria 06/17/2023   Gout    Gout    HTN (hypertension)    Ischemic cerebrovascular accident (CVA) (HCC) 06/13/2018   Left hip pain 03/15/2020   Left leg pain    Lower abdominal pain 11/03/2023   Osteoarthritis    PAD (peripheral artery disease)    Stasis dermatitis    Stroke (HCC)    Vitamin D  deficiency     Assessment: Patient Reported Symptoms: Cognitive Cognitive Status: No symptoms reported (Per DPR)      Neurological Neurological Review of Symptoms: No symptoms reported (per DPR)    HEENT HEENT Symptoms Reported: No symptoms reported (per DPR)      Cardiovascular Cardiovascular Symptoms Reported: No symptoms reported (per DPR) Does patient have uncontrolled Hypertension?: Yes Is patient checking Blood Pressure at home?: Yes Patient's Recent BP reading at home: BP checked daily at home. DPR unable to provide reading, reports normal BP Cardiovascular Management Strategies: Coping strategies, Adequate rest, Medical device, Medication therapy, Routine screening Cardiovascular Self-Management Outcome: 4 (good) Cardiovascular Comment: Rescheduled with Dr. Patwardan on 01/19/25. DPR reports BP have been good.  Respiratory Respiratory Symptoms  Reported: No symptoms reported (per DPR)    Endocrine Endocrine Symptoms Reported: No symptoms reported (Per DPR)    Gastrointestinal Gastrointestinal Symptoms Reported: No symptoms reported (per DPR)      Genitourinary Genitourinary Symptoms Reported: No symptoms reported (per DPR)    Integumentary Integumentary Symptoms Reported: No symptoms reported (per DPR)    Musculoskeletal   Musculoskeletal Management Strategies: Medication therapy, Medical device, Routine screening, Coping strategies, Adequate rest Musculoskeletal Self-Management Outcome: 4 (good) Musculoskeletal Comment: Continues to have HH PT/OT weekly visits Falls in the past year?: No    Psychosocial Psychosocial Symptoms Reported: No symptoms reported (per DPR)         There were no vitals filed for this visit.    Medications Reviewed Today     Reviewed by Lucky Andrea LABOR, RN (Registered Nurse) on 12/02/24 at 1425  Med List Status: <None>   Medication Order Taking? Sig Documenting Provider Last Dose Status Informant  apixaban  (ELIQUIS ) 5 MG TABS tablet [id number] Yes Take 1 tablet (5 mg total) by mouth 2 (two) times daily. Patwardhan, Newman PARAS, MD  Active   atorvastatin  (LIPITOR ) 80 MG tablet [id number] Yes Take 1 tablet (80 mg total) by mouth daily. Patwardhan, Newman PARAS, MD  Active   azelastine  (ASTELIN ) 0.1 % nasal spray [id number] Yes Place 2 sprays into both nostrils 2 (two) times daily. Use in each nostril as directed  Patient taking differently: Place 2 sprays into both nostrils 2 (two) times daily as needed. Use in each nostril as directed   Georgina Speaks, FNP  Active Child  Cholecalciferol  (VITAMIN D3) 5000 units CAPS [id number] Yes Take 5,000 Units by mouth daily. [provider]  Active Child  diclofenac  Sodium (VOLTAREN ) 1 % GEL [id number] Yes Apply 2 g topically 4 (four) times daily. APPLY TWO GRAMS TOPICALLY FOUR TIMES A DAY  Patient taking differently: Apply 2 g topically 2 (two) times daily.  APPLY TWO GRAMS TOPICALLY FOUR TIMES A DAY   Georgina Speaks, FNP  Active Child  furosemide  (LASIX ) 40 MG tablet [id number] Yes Take 1 tablet (40 mg total) by mouth daily. [place] Newman PARAS, MD  Active   GNP 8 HOUR ARTHRITIS RELIEF 650 MG CR tablet [id number] Yes TAKE ONE TABLET BY MOUTH TWICE A DAY Georgina Speaks, FNP  Active Child  losartan  (COZAAR ) 25 MG tablet [id number] Yes Take 1 tablet (25 mg total) by mouth daily. Patwardhan, Newman PARAS, MD  Active   methimazole  (TAPAZOLE ) 10 MG tablet [id number] Yes Take 1 tablet (10 mg total) by mouth daily. Shamleffer, Donell Cardinal, MD  Active Child  mirtazapine  (REMERON ) 7.5 MG tablet [id number] Yes TAKE 1 TABLET (7.5 MG TOTAL) BY MOUTH AT BEDTIME. Georgina Speaks, FNP  Active Child  Multiple Vitamins-Minerals (COMPLETE SENIOR PO) [id number] Yes 1 tablet daily.  [provider]  Active Child  spironolactone  (ALDACTONE ) 25 MG tablet [id number] Yes Take 1/2 tablet (12.5 mg total) by mouth daily. Georgina Speaks, FNP  Active   tamsulosin  (FLOMAX ) 0.4 MG CAPS capsule [id number] Yes TAKE ONE CAPSULE BY MOUTH DAILY HALF HOUR FOLLOWING THE SAME MEAL DAILY Georgina Speaks, FNP  Active Child  vitamin B-12 (CYANOCOBALAMIN ) 500 MCG tablet [id number] Yes Take 500 mcg by mouth daily. [provider]  Active Child  vitamin C  (ASCORBIC ACID ) 500 MG tablet [id number] Yes Take 500 mg daily by mouth. [provider]  Active Child  Med List Note Steffi Nian, CPhT 09/07/24 [phone]): Contact daughter as she manages the patients medications.             Recommendation:   Continue Current Plan of Care  Follow Up Plan:   Closing From:  Transitions of Care Program  Andrea Dimes RN, BSN [place]  Value-Based Care Institute [facility] RN Care Manager [phone]     "

## 2024-12-16 ENCOUNTER — Other Ambulatory Visit: Payer: Self-pay | Admitting: Nurse Practitioner

## 2024-12-16 DIAGNOSIS — G4709 Other insomnia: Secondary | ICD-10-CM

## 2024-12-16 DIAGNOSIS — E0590 Thyrotoxicosis, unspecified without thyrotoxic crisis or storm: Secondary | ICD-10-CM

## 2024-12-22 ENCOUNTER — Encounter: Payer: Self-pay | Admitting: Nurse Practitioner

## 2024-12-22 ENCOUNTER — Telehealth: Admitting: Nurse Practitioner

## 2024-12-22 DIAGNOSIS — Z741 Need for assistance with personal care: Secondary | ICD-10-CM

## 2024-12-22 DIAGNOSIS — R2681 Unsteadiness on feet: Secondary | ICD-10-CM

## 2024-12-22 DIAGNOSIS — I5022 Chronic systolic (congestive) heart failure: Secondary | ICD-10-CM

## 2024-12-22 DIAGNOSIS — I10 Essential (primary) hypertension: Secondary | ICD-10-CM

## 2024-12-22 DIAGNOSIS — G8191 Hemiplegia, unspecified affecting right dominant side: Secondary | ICD-10-CM

## 2024-12-22 NOTE — Progress Notes (Addendum)
 "  Virtual Visit via Video Note  Jesus Lam, CMA,acting as a scribe for Lam Ada, FNP.,have documented all relevant documentation on the behalf of Lam Ada, FNP,as directed by  Lam Ada, FNP while in the presence of Lam Ada, FNP.  I connected with Jesus Lam on 12/22/2024 at  3:20 PM EST by a video enabled telemedicine application and verified that I am speaking with the correct person using two identifiers.  Patient Location: Home Provider Location: Home Office  I discussed the limitations, risks, security, and privacy concerns of performing an evaluation and management service by video and the availability of in person appointments. I also discussed with the patient that there may be a patient responsible charge related to this service. The patient expressed understanding and agreed to proceed.  Subjective: PCP: Jesus Gaines, FNP  Chief Complaint  Patient presents with   Gait Problem    Patient's daughter reports he is having issues with long distance walking. He can walk for a short distance but not long, he is unable to get around his apartment complex. She also reports he has trouble getting around to do his activies outside the house. Patient reports Jesus Lam from OT recommended this but she can't remember the name of the company the motorized wheelchair needs to go to.    His physical therapist recommends he has a motorized chair. There was someone who came with him at the home. He is getting his PT from [facility]. He is able to lift his legs. He is having difficulty with walking distances. He is able to walk to the elevator but he has to stop once he gets to the elevator. She feels it would make his life easier if he had a motorized chair. He has not had a fall in a while. The PT has filled out a paper for him to get a shower, needs a signature from the PCP.     ROS: Per HPI Current Medications[1]  Observations/Objective: There were no vitals filed for  this visit. Physical Exam Constitutional:      General: He is not in acute distress.    Appearance: Normal appearance. He is obese.  Pulmonary:     Effort: Pulmonary effort is normal. No respiratory distress.  Musculoskeletal:        General: No tenderness.     Comments: Able to raise bilateral legs but appear to be slightly weak.   Neurological:     General: No focal deficit present.     Mental Status: He is alert and oriented to person, place, and time.  Psychiatric:        Mood and Affect: Mood normal.        Behavior: Behavior normal.        Thought Content: Thought content normal.        Judgment: Judgment normal.     Assessment and Plan: Unstable gait Assessment & Plan: No falls however needing to use cane and having more instability of his gait. Will order a motorized scooter to assist with ambulation from [employer], this will benefit his quality of life.    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 He is having increased shortness of breath on ambulation and having to stop frequently     Follow Up Instructions: Return  for keep same next.   I discussed the assessment and treatment plan with the patient. The patient was provided an opportunity to ask questions, and all were answered. The patient agreed with the plan and demonstrated an understanding of the instructions.   The patient was advised to call back or seek an in-person evaluation if the symptoms worsen or if the condition fails to improve as anticipated.  The above assessment and management plan was discussed with the patient. The patient verbalized understanding of and has agreed to the management plan.  Jesus Lam Ada, FNP, have reviewed all documentation for  this visit. The documentation on 12/22/24 for the exam, diagnosis, procedures, and orders are all accurate and complete.       [1]  Current Outpatient Medications:    apixaban  (ELIQUIS ) 5 MG TABS tablet, Take 1 tablet (5 mg total) by mouth 2 (two) times daily., Disp: 180 tablet, Rfl: 3   atorvastatin  (LIPITOR ) 80 MG tablet, Take 1 tablet (80 mg total) by mouth daily., Disp: 90 tablet, Rfl: 3   azelastine  (ASTELIN ) 0.1 % nasal spray, Place 2 sprays into both nostrils 2 (two) times daily. Use in each nostril as directed (Patient taking differently: Place 2 sprays into both nostrils 2 (two) times daily as needed. Use in each nostril as directed), Disp: 30 mL, Rfl: 5   Cholecalciferol  (VITAMIN D3) 5000 units CAPS, Take 5,000 Units by mouth daily., Disp: , Rfl:    diclofenac  Sodium (VOLTAREN ) 1 % GEL, Apply 2 g topically 4 (four) times daily. APPLY TWO GRAMS TOPICALLY FOUR TIMES A DAY (Patient taking differently: Apply 2 g topically 2 (two) times daily. APPLY TWO GRAMS TOPICALLY FOUR TIMES A DAY), Disp: 100 g, Rfl: 1   furosemide  (LASIX ) 40 MG tablet, Take 1 tablet (40 mg total) by mouth daily., Disp: 90 tablet, Rfl: 3   GNP 8 HOUR ARTHRITIS RELIEF 650 MG CR tablet, TAKE ONE TABLET BY MOUTH TWICE A DAY, Disp: 60 tablet, Rfl: 0   losartan  (COZAAR ) 25 MG tablet, Take 1 tablet (25 mg total) by mouth daily., Disp: 90 tablet, Rfl: 3   methimazole  (TAPAZOLE ) 10 MG tablet, Take 1 tablet (10 mg total) by mouth daily., Disp: 30 tablet, Rfl: 6   mirtazapine  (REMERON ) 7.5 MG tablet, TAKE 1 TABLET (7.5 MG TOTAL) BY MOUTH AT BEDTIME., Disp: 30 tablet, Rfl: 0   Multiple Vitamins-Minerals (COMPLETE SENIOR PO), 1 tablet daily. , Disp: , Rfl:    spironolactone  (ALDACTONE ) 25 MG tablet, Take 1/2 tablet (12.5 mg total) by mouth daily., Disp: 45 tablet, Rfl: 2   tamsulosin  (FLOMAX ) 0.4 MG CAPS capsule, TAKE ONE CAPSULE BY MOUTH DAILY HALF HOUR FOLLOWING THE SAME MEAL DAILY, Disp: 90 capsule, Rfl: 4   vitamin B-12  (CYANOCOBALAMIN ) 500 MCG tablet, Take 500 mcg by mouth daily., Disp: , Rfl:    vitamin C  (ASCORBIC ACID ) 500 MG tablet, Take 500 mg daily by mouth., Disp: , Rfl:   "

## 2024-12-24 ENCOUNTER — Ambulatory Visit: Payer: Self-pay

## 2024-12-24 DIAGNOSIS — G8191 Hemiplegia, unspecified affecting right dominant side: Secondary | ICD-10-CM | POA: Insufficient documentation

## 2024-12-24 DIAGNOSIS — R2681 Unsteadiness on feet: Secondary | ICD-10-CM | POA: Insufficient documentation

## 2024-12-24 NOTE — Assessment & Plan Note (Signed)
 Since having his CVA has right hemiparesis

## 2024-12-24 NOTE — Assessment & Plan Note (Signed)
 Low BP during hospitalization affected Entresto  tolerance. - Continue current antihypertensive regimen.

## 2024-12-24 NOTE — Assessment & Plan Note (Addendum)
 No falls however needing to use cane and having more instability of his gait. Will order a motorized scooter to assist with ambulation from [employer], this will benefit his quality of life.

## 2024-12-24 NOTE — Assessment & Plan Note (Addendum)
 Continue f/u with Cardiology, this has been increasing his issues with ambulation with periods of lower extremity swelling. He is having increased shortness of breath on ambulation and having to stop frequently

## 2024-12-29 ENCOUNTER — Ambulatory Visit (HOSPITAL_COMMUNITY)
Admission: RE | Admit: 2024-12-29 | Discharge: 2024-12-29 | Disposition: A | Source: Ambulatory Visit | Attending: Cardiology | Admitting: Cardiology

## 2024-12-29 DIAGNOSIS — I5020 Unspecified systolic (congestive) heart failure: Secondary | ICD-10-CM | POA: Diagnosis not present

## 2024-12-29 LAB — ECHOCARDIOGRAM COMPLETE
AR max vel: 1.46 cm2
AV Area VTI: 1.46 cm2
AV Area mean vel: 1.52 cm2
AV Mean grad: 13 mmHg
AV Peak grad: 24.4 mmHg
Ao pk vel: 2.47 m/s
S' Lateral: 4.58 cm

## 2024-12-30 ENCOUNTER — Ambulatory Visit: Payer: Self-pay | Admitting: Cardiology

## 2025-01-14 ENCOUNTER — Ambulatory Visit: Admitting: Cardiovascular Disease

## 2025-01-19 ENCOUNTER — Ambulatory Visit: Admitting: Cardiology

## 2025-04-12 ENCOUNTER — Ambulatory Visit: Admitting: Nurse Practitioner

## 2025-10-03 ENCOUNTER — Ambulatory Visit: Admitting: Neurology
